# Patient Record
Sex: Male | Born: 1940 | Race: White | Hispanic: No | Marital: Married | Smoking: Former smoker
Health system: Southern US, Community
[De-identification: ages and names within clinical notes are randomized; demographics above are authoritative.]

## PROBLEM LIST (undated history)

## (undated) DIAGNOSIS — E785 Hyperlipidemia, unspecified: Secondary | ICD-10-CM

## (undated) DIAGNOSIS — I639 Cerebral infarction, unspecified: Secondary | ICD-10-CM

## (undated) DIAGNOSIS — T8859XA Other complications of anesthesia, initial encounter: Secondary | ICD-10-CM

## (undated) DIAGNOSIS — E039 Hypothyroidism, unspecified: Secondary | ICD-10-CM

## (undated) DIAGNOSIS — I2510 Atherosclerotic heart disease of native coronary artery without angina pectoris: Secondary | ICD-10-CM

## (undated) DIAGNOSIS — E079 Disorder of thyroid, unspecified: Secondary | ICD-10-CM

## (undated) DIAGNOSIS — I739 Peripheral vascular disease, unspecified: Secondary | ICD-10-CM

## (undated) DIAGNOSIS — I10 Essential (primary) hypertension: Secondary | ICD-10-CM

## (undated) DIAGNOSIS — I63239 Cerebral infarction due to unspecified occlusion or stenosis of unspecified carotid arteries: Secondary | ICD-10-CM

## (undated) DIAGNOSIS — N2889 Other specified disorders of kidney and ureter: Secondary | ICD-10-CM

## (undated) DIAGNOSIS — J189 Pneumonia, unspecified organism: Secondary | ICD-10-CM

## (undated) DIAGNOSIS — E119 Type 2 diabetes mellitus without complications: Secondary | ICD-10-CM

## (undated) DIAGNOSIS — N189 Chronic kidney disease, unspecified: Secondary | ICD-10-CM

## (undated) DIAGNOSIS — T4145XA Adverse effect of unspecified anesthetic, initial encounter: Secondary | ICD-10-CM

## (undated) DIAGNOSIS — I219 Acute myocardial infarction, unspecified: Secondary | ICD-10-CM

## (undated) DIAGNOSIS — C801 Malignant (primary) neoplasm, unspecified: Secondary | ICD-10-CM

## (undated) DIAGNOSIS — R634 Abnormal weight loss: Secondary | ICD-10-CM

## (undated) DIAGNOSIS — I499 Cardiac arrhythmia, unspecified: Secondary | ICD-10-CM

## (undated) DIAGNOSIS — Z5189 Encounter for other specified aftercare: Secondary | ICD-10-CM

## (undated) DIAGNOSIS — Z972 Presence of dental prosthetic device (complete) (partial): Secondary | ICD-10-CM

## (undated) HISTORY — DX: Cerebral infarction due to unspecified occlusion or stenosis of unspecified carotid artery: I63.239

## (undated) HISTORY — PX: BACK SURGERY: SHX140

## (undated) HISTORY — DX: Cerebral infarction, unspecified: I63.9

## (undated) HISTORY — PX: LARYNX SURGERY: SHX692

## (undated) HISTORY — DX: Pneumonia, unspecified organism: J18.9

## (undated) HISTORY — DX: Essential (primary) hypertension: I10

## (undated) HISTORY — DX: Hyperlipidemia, unspecified: E78.5

## (undated) HISTORY — PX: CATARACT EXTRACTION, BILATERAL: SHX1313

## (undated) HISTORY — DX: Disorder of thyroid, unspecified: E07.9

## (undated) HISTORY — PX: KNEE SURGERY: SHX244

## (undated) HISTORY — DX: Encounter for other specified aftercare: Z51.89

---

## 1987-10-15 HISTORY — PX: SPINE SURGERY: SHX786

## 1995-10-15 DIAGNOSIS — I219 Acute myocardial infarction, unspecified: Secondary | ICD-10-CM

## 1995-10-15 HISTORY — PX: CORONARY ARTERY BYPASS GRAFT: SHX141

## 1995-10-15 HISTORY — PX: CARDIAC CATHETERIZATION: SHX172

## 1995-10-15 HISTORY — DX: Acute myocardial infarction, unspecified: I21.9

## 1999-10-15 DIAGNOSIS — C801 Malignant (primary) neoplasm, unspecified: Secondary | ICD-10-CM

## 1999-10-15 HISTORY — DX: Malignant (primary) neoplasm, unspecified: C80.1

## 2000-10-14 HISTORY — PX: PROSTATE SURGERY: SHX751

## 2006-11-27 ENCOUNTER — Ambulatory Visit: Payer: Self-pay | Admitting: Family Medicine

## 2006-12-13 ENCOUNTER — Ambulatory Visit: Payer: Self-pay | Admitting: Family Medicine

## 2007-01-13 ENCOUNTER — Ambulatory Visit: Payer: Self-pay | Admitting: Family Medicine

## 2007-07-27 ENCOUNTER — Ambulatory Visit: Payer: Self-pay | Admitting: Family Medicine

## 2007-08-15 ENCOUNTER — Ambulatory Visit: Payer: Self-pay | Admitting: Family Medicine

## 2009-10-14 HISTORY — PX: HERNIA REPAIR: SHX51

## 2009-10-14 HISTORY — PX: APPENDECTOMY: SHX54

## 2009-10-14 HISTORY — PX: COLON SURGERY: SHX602

## 2010-03-01 ENCOUNTER — Ambulatory Visit: Payer: Self-pay | Admitting: Gastroenterology

## 2010-04-03 ENCOUNTER — Ambulatory Visit: Payer: Self-pay | Admitting: Gastroenterology

## 2010-05-30 ENCOUNTER — Ambulatory Visit: Payer: Self-pay | Admitting: Gastroenterology

## 2010-05-31 ENCOUNTER — Ambulatory Visit: Payer: Self-pay | Admitting: Gastroenterology

## 2010-06-01 ENCOUNTER — Other Ambulatory Visit: Payer: Self-pay | Admitting: Gastroenterology

## 2010-06-26 ENCOUNTER — Ambulatory Visit: Payer: Self-pay | Admitting: Surgery

## 2010-06-28 LAB — CEA: CEA: 17 ng/mL — ABNORMAL HIGH (ref 0.0–4.7)

## 2010-07-03 ENCOUNTER — Inpatient Hospital Stay: Payer: Self-pay | Admitting: Surgery

## 2010-07-06 LAB — PATHOLOGY REPORT

## 2010-07-14 ENCOUNTER — Ambulatory Visit: Payer: Self-pay | Admitting: Oncology

## 2010-07-27 ENCOUNTER — Ambulatory Visit: Payer: Self-pay | Admitting: Oncology

## 2010-07-29 LAB — CEA: CEA: 5.7 ng/mL — ABNORMAL HIGH (ref 0.0–4.7)

## 2010-08-14 ENCOUNTER — Ambulatory Visit: Payer: Self-pay | Admitting: Oncology

## 2010-11-09 ENCOUNTER — Ambulatory Visit: Payer: Self-pay | Admitting: Oncology

## 2010-11-14 ENCOUNTER — Ambulatory Visit: Payer: Self-pay | Admitting: Oncology

## 2010-12-19 ENCOUNTER — Ambulatory Visit: Payer: Self-pay | Admitting: Gastroenterology

## 2011-02-08 ENCOUNTER — Ambulatory Visit: Payer: Self-pay | Admitting: Oncology

## 2011-02-12 ENCOUNTER — Ambulatory Visit: Payer: Self-pay | Admitting: Oncology

## 2011-05-10 ENCOUNTER — Ambulatory Visit: Payer: Self-pay | Admitting: Oncology

## 2011-05-11 LAB — CEA: CEA: 4.4 ng/mL (ref 0.0–4.7)

## 2011-05-15 ENCOUNTER — Ambulatory Visit: Payer: Self-pay | Admitting: Oncology

## 2011-08-21 ENCOUNTER — Ambulatory Visit: Payer: Self-pay | Admitting: Oncology

## 2011-08-22 LAB — CEA: CEA: 5.5 ng/mL — ABNORMAL HIGH (ref 0.0–4.7)

## 2011-09-14 ENCOUNTER — Ambulatory Visit: Payer: Self-pay | Admitting: Oncology

## 2011-09-20 ENCOUNTER — Ambulatory Visit: Payer: Self-pay | Admitting: Gastroenterology

## 2011-11-07 LAB — COMPREHENSIVE METABOLIC PANEL
Albumin: 2.8 g/dL — ABNORMAL LOW (ref 3.4–5.0)
Alkaline Phosphatase: 88 U/L (ref 50–136)
BUN: 28 mg/dL — ABNORMAL HIGH (ref 7–18)
Bilirubin,Total: 0.3 mg/dL (ref 0.2–1.0)
Chloride: 111 mmol/L — ABNORMAL HIGH (ref 98–107)
Co2: 20 mmol/L — ABNORMAL LOW (ref 21–32)
Creatinine: 1.77 mg/dL — ABNORMAL HIGH (ref 0.60–1.30)
EGFR (African American): 49 — ABNORMAL LOW
Glucose: 81 mg/dL (ref 65–99)
Osmolality: 288 (ref 275–301)
SGOT(AST): 31 U/L (ref 15–37)
SGPT (ALT): 33 U/L
Sodium: 142 mmol/L (ref 136–145)
Total Protein: 6.3 g/dL — ABNORMAL LOW (ref 6.4–8.2)

## 2011-11-07 LAB — CBC
MCH: 25 pg — ABNORMAL LOW (ref 26.0–34.0)
MCHC: 32.4 g/dL (ref 32.0–36.0)
Platelet: 131 10*3/uL — ABNORMAL LOW (ref 150–440)
RBC: 5.62 10*6/uL (ref 4.40–5.90)
WBC: 9.2 10*3/uL (ref 3.8–10.6)

## 2011-11-07 LAB — CK TOTAL AND CKMB (NOT AT ARMC): CK, Total: 160 U/L (ref 35–232)

## 2011-11-07 LAB — MAGNESIUM: Magnesium: 1.1 mg/dL — ABNORMAL LOW

## 2011-11-07 LAB — TROPONIN I: Troponin-I: <0.02 ng/mL

## 2011-11-07 LAB — TSH: Thyroid Stimulating Horm: 3.84 u[IU]/mL

## 2011-11-08 ENCOUNTER — Observation Stay: Payer: Self-pay | Admitting: Internal Medicine

## 2011-11-08 LAB — CBC WITH DIFFERENTIAL/PLATELET
Basophil #: 0.1 10*3/uL (ref 0.0–0.1)
Eosinophil #: 0.1 10*3/uL (ref 0.0–0.7)
HCT: 38 % — ABNORMAL LOW (ref 40.0–52.0)
HGB: 12.5 g/dL — ABNORMAL LOW (ref 13.0–18.0)
Lymphocyte %: 34.2 %
MCHC: 32.8 g/dL (ref 32.0–36.0)
MCV: 76 fL — ABNORMAL LOW (ref 80–100)
Monocyte #: 0.6 10*3/uL (ref 0.0–0.7)
Monocyte %: 10.2 %
Neutrophil #: 3.1 10*3/uL (ref 1.4–6.5)
Neutrophil %: 52.3 %

## 2011-11-08 LAB — BASIC METABOLIC PANEL
Anion Gap: 12 (ref 7–16)
BUN: 29 mg/dL — ABNORMAL HIGH (ref 7–18)
Chloride: 108 mmol/L — ABNORMAL HIGH (ref 98–107)
Creatinine: 1.9 mg/dL — ABNORMAL HIGH (ref 0.60–1.30)
EGFR (African American): 45 — ABNORMAL LOW
EGFR (Non-African Amer.): 37 — ABNORMAL LOW
Glucose: 181 mg/dL — ABNORMAL HIGH (ref 65–99)
Osmolality: 295 (ref 275–301)

## 2011-11-08 LAB — TROPONIN I
Troponin-I: <0.02 ng/mL
Troponin-I: <0.02 ng/mL

## 2011-11-08 LAB — HEMOGLOBIN A1C: Hemoglobin A1C: 10.2 % — ABNORMAL HIGH (ref 4.2–6.3)

## 2011-11-08 LAB — MAGNESIUM: Magnesium: 1.9 mg/dL

## 2011-11-08 LAB — CK TOTAL AND CKMB (NOT AT ARMC)
CK, Total: 222 U/L (ref 35–232)
CK-MB: 2.7 ng/mL (ref 0.5–3.6)

## 2012-01-20 LAB — HM COLONOSCOPY: HM Colonoscopy: NORMAL

## 2012-02-26 ENCOUNTER — Ambulatory Visit: Payer: Self-pay | Admitting: Oncology

## 2012-02-26 LAB — CBC CANCER CENTER
Basophil #: 0.1 x10 3/mm (ref 0.0–0.1)
Basophil %: 0.9 %
Eosinophil #: 0.3 x10 3/mm (ref 0.0–0.7)
HCT: 45.1 % (ref 40.0–52.0)
HGB: 14.2 g/dL (ref 13.0–18.0)
Lymphocyte %: 32.9 %
MCV: 80 fL (ref 80–100)
Monocyte #: 0.7 x10 3/mm (ref 0.2–1.0)
Neutrophil %: 53.2 %
RBC: 5.63 10*6/uL (ref 4.40–5.90)
RDW: 17.7 % — ABNORMAL HIGH (ref 11.5–14.5)

## 2012-02-27 LAB — CEA: CEA: 4.5 ng/mL (ref 0.0–4.7)

## 2012-03-14 ENCOUNTER — Ambulatory Visit: Payer: Self-pay | Admitting: Oncology

## 2012-10-14 DIAGNOSIS — J189 Pneumonia, unspecified organism: Secondary | ICD-10-CM

## 2012-10-14 HISTORY — DX: Pneumonia, unspecified organism: J18.9

## 2012-12-07 ENCOUNTER — Ambulatory Visit: Payer: Self-pay | Admitting: Vascular Surgery

## 2012-12-10 ENCOUNTER — Ambulatory Visit: Payer: Self-pay | Admitting: Vascular Surgery

## 2012-12-10 LAB — BASIC METABOLIC PANEL
Anion Gap: 9 (ref 7–16)
Calcium, Total: 8.1 mg/dL — ABNORMAL LOW (ref 8.5–10.1)
Co2: 20 mmol/L — ABNORMAL LOW (ref 21–32)
Creatinine: 1.88 mg/dL — ABNORMAL HIGH (ref 0.60–1.30)
EGFR (African American): 40 — ABNORMAL LOW
Sodium: 140 mmol/L (ref 136–145)

## 2013-07-15 DIAGNOSIS — I129 Hypertensive chronic kidney disease with stage 1 through stage 4 chronic kidney disease, or unspecified chronic kidney disease: Secondary | ICD-10-CM | POA: Insufficient documentation

## 2013-07-15 DIAGNOSIS — E1122 Type 2 diabetes mellitus with diabetic chronic kidney disease: Secondary | ICD-10-CM | POA: Insufficient documentation

## 2013-09-01 ENCOUNTER — Ambulatory Visit: Payer: Self-pay | Admitting: Oncology

## 2013-09-01 LAB — CBC CANCER CENTER
Eosinophil %: 2.1 %
Lymphocyte #: 2.1 x10 3/mm (ref 1.0–3.6)
MCV: 90 fL (ref 80–100)
Monocyte #: 0.6 x10 3/mm (ref 0.2–1.0)
Neutrophil %: 58.6 %
RDW: 15 % — ABNORMAL HIGH (ref 11.5–14.5)
WBC: 6.8 x10 3/mm (ref 3.8–10.6)

## 2013-09-01 LAB — PROTIME-INR
INR: 1
Prothrombin Time: 13.7 secs (ref 11.5–14.7)

## 2013-09-02 LAB — CEA: CEA: 5.1 ng/mL — ABNORMAL HIGH (ref 0.0–4.7)

## 2013-09-13 ENCOUNTER — Ambulatory Visit: Payer: Self-pay | Admitting: Oncology

## 2013-09-20 ENCOUNTER — Ambulatory Visit: Payer: Self-pay | Admitting: Internal Medicine

## 2013-12-13 ENCOUNTER — Ambulatory Visit: Payer: Self-pay | Admitting: Internal Medicine

## 2014-04-14 DIAGNOSIS — I10 Essential (primary) hypertension: Secondary | ICD-10-CM | POA: Insufficient documentation

## 2014-04-14 DIAGNOSIS — I25810 Atherosclerosis of coronary artery bypass graft(s) without angina pectoris: Secondary | ICD-10-CM | POA: Insufficient documentation

## 2014-04-25 DIAGNOSIS — R809 Proteinuria, unspecified: Secondary | ICD-10-CM | POA: Insufficient documentation

## 2014-05-16 LAB — PSA: PSA: <0.1

## 2014-05-17 LAB — LIPID PANEL
Cholesterol: 126 mg/dL (ref 0–200)
HDL: 18 mg/dL — AB (ref 35–70)
LDL Cholesterol: 45 mg/dL
Triglycerides: 313 mg/dL — AB (ref 40–160)

## 2014-05-17 LAB — TSH: TSH: 11.9 u[IU]/mL — AB (ref <=5.90)

## 2014-05-19 DIAGNOSIS — E875 Hyperkalemia: Secondary | ICD-10-CM | POA: Insufficient documentation

## 2014-07-27 DIAGNOSIS — E889 Metabolic disorder, unspecified: Secondary | ICD-10-CM | POA: Insufficient documentation

## 2015-01-20 ENCOUNTER — Encounter: Payer: Self-pay | Admitting: Internal Medicine

## 2015-01-20 DIAGNOSIS — N289 Disorder of kidney and ureter, unspecified: Secondary | ICD-10-CM | POA: Insufficient documentation

## 2015-01-20 DIAGNOSIS — Z85038 Personal history of other malignant neoplasm of large intestine: Secondary | ICD-10-CM | POA: Insufficient documentation

## 2015-01-20 DIAGNOSIS — E1165 Type 2 diabetes mellitus with hyperglycemia: Secondary | ICD-10-CM | POA: Insufficient documentation

## 2015-01-20 DIAGNOSIS — M109 Gout, unspecified: Secondary | ICD-10-CM | POA: Insufficient documentation

## 2015-01-20 DIAGNOSIS — Z125 Encounter for screening for malignant neoplasm of prostate: Secondary | ICD-10-CM | POA: Insufficient documentation

## 2015-01-20 DIAGNOSIS — I2510 Atherosclerotic heart disease of native coronary artery without angina pectoris: Secondary | ICD-10-CM | POA: Insufficient documentation

## 2015-01-20 DIAGNOSIS — IMO0002 Reserved for concepts with insufficient information to code with codable children: Secondary | ICD-10-CM | POA: Insufficient documentation

## 2015-01-20 DIAGNOSIS — E1169 Type 2 diabetes mellitus with other specified complication: Secondary | ICD-10-CM | POA: Insufficient documentation

## 2015-01-20 DIAGNOSIS — E1142 Type 2 diabetes mellitus with diabetic polyneuropathy: Secondary | ICD-10-CM | POA: Insufficient documentation

## 2015-01-20 DIAGNOSIS — E785 Hyperlipidemia, unspecified: Secondary | ICD-10-CM

## 2015-01-20 DIAGNOSIS — E039 Hypothyroidism, unspecified: Secondary | ICD-10-CM | POA: Insufficient documentation

## 2015-02-03 NOTE — Op Note (Signed)
PATIENT NAME:  [name], [name] MR#:  [id number] DATE OF BIRTH:  1940/11/19  DATE OF PROCEDURE:  12/10/2012  PREOPERATIVE DIAGNOSES:  1.  Carotid artery stenosis.  2.  Chronic kidney disease precluding CT angiogram.  3.  Hypertension.  4.  Coronary artery disease.  5.  Diabetes.  POSTOPERATIVE DIAGNOSES:  1.  Carotid artery stenosis.  2.  Chronic kidney disease precluding CT angiogram.  3.  Hypertension.  4.  Coronary artery disease.  5.  Diabetes.  PROCEDURE:   1.  Ultrasound guidance for vascular access to right femoral artery.  2.  Catheter placement into left external carotid artery from right femoral approach.  3.  Thoracic aortogram.  4.  Selective left external carotid arteriogram.  5.  StarClose closure device for right femoral artery.   SURGEON: Algernon Huxley, M.D.   ANESTHESIA: Local with moderate conscious sedation.   ESTIMATED BLOOD LOSS: 25 mL.  CONTRAST USED: 54 mL Visipaque.   INDICATION FOR PROCEDURE: This is a 74 year old white male with carotid artery stenosis. He had velocities that were worrisome for severe stenotic disease of the left carotid artery on ultrasound. A CT angiogram was ordered, but could not be performed due to chronic kidney disease. He is brought in for a catheter-based angiogram with limited contrast for further evaluation. Risks and benefits were discussed. Informed consent was obtained.   DESCRIPTION OF PROCEDURE: The patient was brought to the vascular interventional radiology suite. Groins were shaved and prepped and a sterile surgical field was created. Due to body habitus and poorly palpable right femoral artery, ultrasound was used to access the right femoral artery. This was done under direct ultrasound guidance without difficulty with a Seldinger needle. A J-wire and 5-French sheath were placed. Pigtail catheter was placed to the aorta and an LAO projection thoracic aortogram was performed. This demonstrated a normal innominate artery with  a large vertebral artery. There appeared to be mild to moderate stenosis in the right internal carotid artery more distally. This was not tremendously well opacified but due to contrast limitations, selective imaging of his right carotid artery was not performed today, and this would correlate with his ultrasound findings in the office. His left subclavian artery was widely patent, as was the large left vertebral artery. He had separate takeoffs of the left external carotid artery and left internal carotid artery off of the aorta, as seen previously on our ultrasound. This was aberrant anatomy. The external carotid artery was patent and reasonably large. The internal carotid artery was very small and had minimal flow and appeared to be occluded only a few centimeters into the internal carotid artery. Two thoracic aortogram images were performed to confirm this. Attempts at selectively cannulating this artery to get a catheter into the very origin, but could not could selective imaging or pass a wire distally due to the apparent chronic total occlusion of the left internal carotid artery. To evaluate for collateralization, selective imaging of the left external carotid artery was performed. This was cannulated without difficulty with a Headhunter catheter passed into the mid cervical portion. Selective imaging showed the external carotid to be widely patent. There did appear to be some filling of the internal carotid artery near the siphon through collaterals, although intracerebral filling was minimal. At this point, I elected to terminate the procedure. The diagnostic catheter was removed.  An oblique arteriogram was performed of the right femoral artery and a StarClose closure device was deployed in the usual fashion with excellent hemostatic  result. The patient tolerated the procedure well and was taken to the recovery room in stable condition.    ____________________________ Algernon Huxley,  MD jsd:jm D: 12/10/2012 15:24:16 ET T: 12/10/2012 18:28:52 ET JOB#: [id number]  cc: Algernon Huxley, MD, <Dictator> Halina Maidens, MD Algernon Huxley MD ELECTRONICALLY SIGNED 12/17/2012 9:40

## 2015-02-05 NOTE — Discharge Summary (Signed)
PATIENT NAME:  [name], LEHOUILLIER MR#:  [id number] DATE OF BIRTH:  [birth year]  DATE OF ADMISSION:  11/08/2011 DATE OF DISCHARGE:  11/08/2011  ADMITTING DIAGNOSIS:  Syncope.   DISCHARGE DIAGNOSES:  1. Syncope of unclear cause, possibly vasovagal, status post cardiology evaluation. No arrhythmia noted on telemetry.  2. On discharge, coronary artery disease with previous history of myocardial infarction.  3. Hypertension.  4. Diabetes.  5. Benign prostatic hypertrophy.  6. History of previous blood clots.  7. Anemia.   8. Hypothyroidism.    9. History of colon cancer.  10. Status post colon resection.  11. Status post back surgery.  12. Status post knee surgery.  13. Status post coronary artery bypass graft.  14. Status post prostate surgery.  15. Status post colonoscopy.  16. Chronic renal failure, likely stage II to III.    PERTINENT LABORATORIES AND EVALUATIONS: EKG showed normal sinus rhythm with nonspecific T wave changes. PACs were present.  CT scan of the brain was negative. Total CPK was 160, CK-MB 2.2, glucose 81, BUN 28, creatinine 1.77, sodium 142, potassium 4.1, chloride 111, CO2 of 20, calcium 7.4, total bilirubin was 7.3, alkaline phosphatase was 88, ALT 33, AST 31, total protein 6.3, albumin 2.8. WBC count 9200, hemoglobin 14, hematocrit 43.2, platelet count was 13.1. Magnesium was 1.1. Cardiac enzymes x3 were less than 0.02. Repeat magnesium on 11/08/2011 showed a magnesium of 1.9.   A 2D echocardiogram shows normal LVEF, estimated ejection fraction 50%, left ventricular hypertrophy, mild mitral regurgitation and tricuspid regurgitation. The patient had a carotid Doppler done as an outpatient. At [facility] he was told that the carotid Dopplers were okay.   HISTORY OF PRESENT ILLNESS: Please see History and Physical done by the admitting physician. The patient is a 74 year old male who presented with chief complaint of a syncopal episode. The patient did not have any  preceding symptoms. He was seen in the ED, had an evaluation with a CT scan of the head that was negative. He was noted to have elevated creatinine, however reviewing back at his creatinine he has chronically elevated creatinine. He was not dehydrated. The patient was admitted, placed on telemetry. No arrhythmias were noted. He had a 2D echocardiogram that showed normal ejection fraction and no other significant abnormalities. He was seen by Cardiology who recommended outpatient followup with his primary cardiologist and if needed may need a 30-day event monitor if he has another episode. At this time, the patient is doing well and is anxious to go home. He is stable for discharge.    DISCHARGE DIET: Low sodium ADA diet.   ACTIVITY: As tolerated.   FOLLOWUP:  Timeframe for followup 1 to 2 weeks with Dr. Eliberto Ivory, follow up with Dr. Ubaldo Glassing of Cardiology in 2 to 4 weeks.   DISCHARGE MEDICATIONS:  1. Humulin 70/30, 120 units subcutaneous q.a.m., 80 units in the evening.   2. Levothyroxine 175 mcg daily.  3. Ecotrin 325 mg daily.  4. Omeprazole 40 daily.  5. Multivitamin 1 tab p.o. daily.   Primary MD needs to decide whether patient needs to be referred to nephrology. He may benefit from that.    TIME SPENT: 32 minutes spent.   ____________________________ Lafonda Mosses. Posey Pronto, MD shp:vtd D: 11/08/2011 16:32:54 ET    T: 11/09/2011 10:15:06 ET    JOB#: [id number] cc: Deetta Siegmann H. Posey Pronto, MD, <Dictator>   Dory Horn. Eliberto Ivory, MD Alric Seton MD ELECTRONICALLY SIGNED 11/30/2011 9:58

## 2015-02-05 NOTE — H&P (Signed)
PATIENT NAME:  [name], [name] MR#:  [id number] DATE OF BIRTH:  June 25, 1941  DATE OF ADMISSION:  11/08/2011  PRIMARY CARE PHYSICIAN: Dr. Calla Kicks   CHIEF COMPLAINT: Syncope.   HISTORY OF PRESENT ILLNESS: Patient is a 74 year old male who presents with chief complaint of syncopal episode about 5:30 p.m. Patient went to the refrigerator to get something to eat. He felt dizzy all of a sudden and passed out for about five minutes. He was unconscious. Witnessed by his wife. There were no reported seizures. No tongue biting. No urinary or fecal incontinence. In the afternoon patient had a carotid Doppler done which was negative. Patient is noted to have acute renal failure, creatinine is 1.77. He has history of nephrolithiasis.   PAST MEDICAL HISTORY:  1. Myocardial infarction. 2. Coronary artery disease.  3. Hypertension.  4. Diabetes.  5. Benign prostatic hypertrophy. 6. History of previous blood clots. 7. Anemia. 8. Hypothyroidism.  9. Colon cancer. 10. Colon resection. 11. Back surgery. 12. Knee surgery. 13. Coronary artery bypass graft. 14. Prostate surgery.  15. Colonoscopy.   ALLERGIES: Accupril and tape.   CURRENT MEDICATIONS: 1. Omeprazole 40 mg p.o. daily.  2. Multivitamin p.o. daily. 3. Levothyroxine 175 mcg p.o. daily.  4. Humulin 70/30, 120 units in the morning and 80 units in the p.m.  5. Ecotrin 325 mg p.o. daily.   SOCIAL HISTORY: Patient quit smoking. Denies tobacco abuse, alcohol abuse, or drug abuse. He lives with his wife.   FAMILY HISTORY: Patient's father died of an abdominal aortic aneurysm rupture. Mother died in her 29s, had lung cancer.   REVIEW OF SYSTEMS: CONSTITUTIONAL: Patient denies any fevers, chills, night sweats. HEENT: Patient denies any hearing loss, dysphagia, visual problems, sore throat. CARDIOVASCULAR: Patient denies any chest pain, orthopnea, PND. RESPIRATORY: Patient denies any cough, wheezing, or hemoptysis. GASTROINTESTINAL: Patient denies  any nausea, vomiting, abdominal pain, hematemesis, hematochezia, or melena. GENITOURINARY: Patient denies any hematuria, dysuria, frequency. NEUROLOGIC: Patient denies any headache, focal weakness or seizures. SKIN: Patient denies any lesions, rash. ENDOCRINE: Patient denies polyuria, polyphagia, polydipsia. MUSCULOSKELETAL: Patient denies any arthritis, joint effusion, swelling. HEMATOLOGICAL: Patient denies any easy bleeding or bruises.   PHYSICAL EXAMINATION:  VITAL SIGNS: Temperature 96.5, heart rate 76, respiratory rate 18, blood pressure 163/83, oxygen saturation 97%.   HEENT: Atraumatic, normocephalic. Pupils are equal, round, and reactive to light and accommodation. Extraocular movements intact. Sclera is anicteric. Mucous membranes are dry.   NECK: Supple. No organomegaly.   CARDIOVASCULAR: S1, S2, regular rate, rhythm. No gallops. No thrills. No murmurs.   RESPIRATORY: Patient's lungs are clear to auscultation. No rales, no rhonchi, no wheezes, no bronchial breath sounds.   GASTROINTESTINAL: Abdomen is soft, nontender, nondistended. Normal bowel sounds. No hepatosplenomegaly.   GENITOURINARY: There is no hematuria or masses.   SKIN: There are no lesions, no rash.   ENDOCRINE: There are no masses, no thyromegaly noted.   LYMPH: No lymphadenopathy or nodes palpable.   NEUROLOGIC: Cranial nerves II through XII grossly intact. Motor strength is symmetrical, 5/5 bilateral upper and lower extremities. Sensation is within normal limits. No focal neurological deficits noted on examination.   MUSCULOSKELETAL: There is no arthritis, joint effusions or swelling.   HEMATOLOGIC: There is no ecchymosis, no bleeding, no petechiae.   EXTREMITIES: There is no cyanosis, no clubbing, no edema. There is 2+ pedal pulses noted bilaterally.   LABORATORY, DIAGNOSTIC AND RADIOLOGICAL DATA: Electrocardiogram shows normal sinus rhythm, nonspecific T wave abnormalities, PACs are present.   CT scan  of  the brain is negative. Total CK 160, CK-MB 2.2, glucose 81, BUN 28, creatinine 1.77, sodium 142, potassium 4.1, chloride 111, CO2 20, calcium 7.4, total bilirubin 0.3, alkaline phosphatase 88, ALT 33, AST 31, total protein 6.3, albumin 2.8, estimated GFR 41, WBC count 9200, hemoglobin 14, hematocrit 43.2, platelet count 131, MCV 77, magnesium 1.1.   ASSESSMENT AND PLAN:  1. Patient is a 74 year old male presents with chief complaint of syncope. Admit patient to telemetry unit. Check serial cardiac enzymes and troponin, echo. Cardiology consultation. The patient had an outpatient carotid Doppler yesterday which was negative. Most likely cause for syncope is mild dehydration and vasovagal.  2. Acute renal failure on chronic renal failure, prerenal azotemia component. Start IV fluids. Patient has history of nephrolithiasis. Will get urology consultation.  3. Diabetes, type 2. Accu-Cheks, insulin sliding scale. Check hemoglobin A1c. Continue 70/30 insulin.  4. Hypothyroidism. Continue levothyroxine. Check TSH.  5. Gastroesophageal reflux disease. Continue omeprazole. 6. History of coronary artery disease, myocardial infarction, coronary artery bypass graft. Continue aspirin.  7. Hypoalbuminemia. Dietary consultation.  8. Mild thrombocytopenia: Monitor platelet count closely.    ____________________________ Tyrone Schimke, MD jsp:cms D: 11/07/2011 23:03:57 ET T: 11/08/2011 06:12:52 ET JOB#: [id number]  cc: Tyrone Schimke, MD, <Dictator> Dory Horn. Eliberto Ivory, MD Tyrone Schimke MD ELECTRONICALLY SIGNED 11/08/2011 22:42

## 2015-02-05 NOTE — Consult Note (Signed)
Brief Consult Note: Diagnosis: Syncope, ? etiology, neg trop, neg ECG, neg tele, normal EF on echo.   Patient was seen by consultant.   Consult note dictated.   Comments: REC  Agree with current therapy, defer full dose anticoagulation, defer further cardiac w/u at this time, f/u Dr Ubaldo Glassing, may consider 30d loop.  Electronic Signatures: Isaias Cowman (MD)  (Signed 25-Jan-13 15:23)  Authored: Brief Consult Note   Last Updated: 25-Jan-13 15:23 by Isaias Cowman (MD)

## 2015-02-05 NOTE — Consult Note (Signed)
PATIENT NAME:  [name], [name] MR#:  [id number] DATE OF BIRTH:  December 19, 1940  DATE OF CONSULTATION:  11/08/2011  REFERRING PHYSICIAN:   CONSULTING PHYSICIAN:  Isaias Cowman, MD  PRIMARY CARE PHYSICIAN: Calla Kicks, MD  CHIEF COMPLAINT: I passed out.   HISTORY OF PRESENT ILLNESS: The patient is a 74 year old gentleman with known history of coronary artery disease status post prior bypass graft surgery who was admitted following a syncopal episode. The patient reports that he was standing in front of the refrigerator and then became lightheaded and apparently passed out. This was witnessed by his wife. EMS was called. The patient was brought to [facility] Emergency Room and was admitted to telemetry. The patient has ruled out for myocardial infarction by CPK isoenzymes and troponin. He has had no recurrence. There has been no evidence for significant brady or tachyarrhythmia on telemetry. The patient denies chest pain and shortness of breath. Echocardiogram revealed preserved left ventricular function.   PAST MEDICAL HISTORY:  1. Coronary artery disease status post bypass graft surgery.  2. Hypertension.  3. Diabetes.  4. Obesity.  5. Hypothyroidism.  6. Anemia.  7. Chronic renal insufficiency.  8. History of colon cancer status post resection. 9. Arthritis status post back and knee surgery.   MEDICATIONS:  1. Ecotrin 325 mg daily.  2. Omeprazole 40 mg daily.  3. Multivitamin 1 daily.  4. Levothyroxine 175 mcg daily.  5. Humulin 70/30, 120 units every a.m. and 80 units at bedtime.  SOCIAL HISTORY: The patient lives with his wife. He denies tobacco abuse.   FAMILY HISTORY: No immediate family history for coronary artery disease or myocardial infarction.   REVIEW OF SYSTEMS:  CONSTITUTIONAL: No fever or chills. EYES: No vision loss. EARS: No hearing loss. RESPIRATORY: No shortness of breath. CARDIOVASCULAR: The patient denies chest pain. The patient did have a  syncopal episode. GASTROINTESTINAL: The patient denies nausea, vomiting, diarrhea, or constipation. GU: The patient denies dysuria or hematuria. ENDOCRINE: The patient denies polyuria or polydipsia. NEUROLOGIC: The patient denies focal muscle weakness or numbness. PSYCHOLOGICAL: The patient denies depression or anxiety.   PHYSICAL EXAMINATION:   VITAL SIGNS: Blood pressure 180/81, pulse 67, respirations 18, temperature 97.4.   HEENT: Pupils equal and reactive to light and accommodation.   NECK: Supple without thyromegaly.   LUNGS: Clear.   HEART: Normal jugular venous pressure. Normal point of maximal impulse. Regular rate and rhythm. Normal S1 and S2. No appreciable gallop, murmur, or rub.   ABDOMEN: Soft and nontender. Pulses were intact bilaterally.  MUSCULOSKELETAL: Normal muscle tone.   NEUROLOGIC: The patient is alert and oriented x3. Motor and sensory are both grossly intact.   IMPRESSION: This is a 74 year old gentleman with known coronary artery disease who presents after a brief syncopal episode and has ruled out for myocardial infarction by CPK isoenzymes and troponin. The patient has had no recurrence. Telemetry has been unremarkable. Echocardiogram has revealed a preserved left ventricular function.   RECOMMENDATIONS:  1. Agree with current therapy.  2. Defer full-dose anticoagulation.  3. Would defer further noninvasive or invasive evaluation.  4. Would continue work-up possibly with a 30-day loop monitor as an outpatient with Dr. Ubaldo Glassing. ____________________________ Isaias Cowman, MD ap:slb D: 11/08/2011 15:21:49 ET T: 11/08/2011 16:19:45 ET JOB#: [id number]  cc: Isaias Cowman, MD, <Dictator> Isaias Cowman MD ELECTRONICALLY SIGNED 11/29/2011 10:57

## 2015-03-16 ENCOUNTER — Encounter: Payer: Self-pay | Admitting: Internal Medicine

## 2015-03-16 ENCOUNTER — Ambulatory Visit (INDEPENDENT_AMBULATORY_CARE_PROVIDER_SITE_OTHER): Payer: PPO | Admitting: Internal Medicine

## 2015-03-16 VITALS — BP 138/76 | HR 84 | Ht 74.0 in | Wt 255.4 lb

## 2015-03-16 DIAGNOSIS — N400 Enlarged prostate without lower urinary tract symptoms: Secondary | ICD-10-CM | POA: Insufficient documentation

## 2015-03-16 DIAGNOSIS — I25810 Atherosclerosis of coronary artery bypass graft(s) without angina pectoris: Secondary | ICD-10-CM | POA: Diagnosis not present

## 2015-03-16 DIAGNOSIS — I10 Essential (primary) hypertension: Secondary | ICD-10-CM | POA: Diagnosis not present

## 2015-03-16 DIAGNOSIS — N183 Chronic kidney disease, stage 3 unspecified: Secondary | ICD-10-CM

## 2015-03-16 DIAGNOSIS — E782 Mixed hyperlipidemia: Secondary | ICD-10-CM | POA: Diagnosis not present

## 2015-03-16 NOTE — Progress Notes (Signed)
Date:  03/16/2015   Name:  [name]   DOB:  11-20-1940   MRN:  [id number]  PCP:  Halina Maidens, MD    Chief Complaint: Diabetes; Hypothyroidism; and Hyperlipidemia   History of Present Illness:  Diabetes - Now seeing Dr. Eddie Dibbles.  His insulin was changed to NPH bid and SSI regular before each meal.  His last A1C 8.6 which is improved.  He feels that he is doing well.  He does not check his BS regularly but estimates the sliding scale based on his intake.  He has not had a diabetic eye exam.  Hypothyroidism - Dr. Eddie Dibbles changed him to synthroid 300 mcg daily.  No associated thyroid symptoms.    Hyperlipidemia -   Neuropathy - he is having more foot pain - worse with activity.  He takes gabapentin 100 mg only as needed for severe foot pain.  He has some sedation but it is tolerable.  He has not tried taking at hs every night.  CAD -  Review of Systems:  Review of Systems  Constitutional: Negative for fever, fatigue and unexpected weight change.  Eyes: Negative for visual disturbance.  Respiratory: Negative for chest tightness, shortness of breath and wheezing.   Cardiovascular: Negative for chest pain, palpitations and leg swelling.  Gastrointestinal: Negative for abdominal pain.  Skin: Negative for rash and wound.  Neurological: Negative for dizziness, light-headedness and headaches.       Numbness and burning in both feet.    Patient Active Problem List   Diagnosis Date Noted  . 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 Chronic kidney disease (CKD), stage III (moderate) 03/16/2015  . Diabetes mellitus with polyneuropathy 03/16/2015  . CAD in native artery 01/20/2015  . Impaired renal function 01/20/2015  . Gout 01/20/2015  . Diabetic peripheral neuropathy 01/20/2015  . H/O malignant neoplasm of colon 01/20/2015  . Adult hypothyroidism 01/20/2015  . Combined fat and carbohydrate induced hyperlipemia 01/20/2015  . Special screening for malignant neoplasm of prostate  01/20/2015  . Diabetes mellitus type 2, uncontrolled 01/20/2015  . Encounter for long-term (current) use of insulin 07/27/2014  . High potassium 05/19/2014  . Microalbuminuria 04/25/2014  . 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 Benign essential HTN 04/14/2014  . BP (high blood pressure) 07/15/2013    Prior to Admission medications   Medication Sig Start Date End Date Taking? Authorizing Provider  glucose blood test strip 3 (three) times daily. 02/20/15  Yes Historical Provider, MD  albuterol (PROVENTIL HFA;VENTOLIN HFA) 108 (90 BASE) MCG/ACT inhaler Inhale 2 puffs into the lungs 4 (four) times daily as needed. 01/03/15   Historical Provider, MD  aspirin 325 MG EC tablet Take 1 tablet by mouth daily.    Historical Provider, MD  atenolol (TENORMIN) 25 MG tablet Take 1 tablet by mouth daily. 08/28/14   Historical Provider, MD  gabapentin (NEURONTIN) 100 MG capsule Take 1 capsule by mouth daily.    Historical Provider, MD  insulin NPH Human (HUMULIN N,NOVOLIN N) 100 UNIT/ML injection Inject 65 Units into the skin daily. 30 u AM and 35 u PM    Historical Provider, MD  insulin regular human CONCENTRATED (HUMULIN R) 500 UNIT/ML SOLN injection Inject 22 Units into the skin 2 (two) times daily.    Historical Provider, MD  levothyroxine (SYNTHROID, LEVOTHROID) 300 MCG tablet Take 1 tablet by mouth daily. 03/22/14   Historical Provider, MD  ranitidine (ZANTAC) 150 MG capsule Take 1 capsule by mouth 2 (  two) times daily. 06/25/13   Historical Provider, MD  simvastatin (ZOCOR) 20 MG tablet Take 1 tablet by mouth at bedtime. 05/22/14   Historical Provider, MD    Allergies  Allergen Reactions  . Ace Inhibitors     Raises potassium    Past Surgical History  Procedure Laterality Date  . Prostate surgery  2002    BPH benign pathology  . Coronary artery bypass graft  1997    x 3  . Spine surgery  1989    Lumbar disc  . Colon surgery  2011    Colectomy for  ileo-cecal valve cancer  . Appendectomy  2011  . Hernia repair  2011    Ventral hernia    History  Substance Use Topics  . Smoking status: Former Smoker    Quit date: 06/24/1996  . Smokeless tobacco: Not on file  . Alcohol Use: No    Family History  Problem Relation Age of Onset  . Cancer Mother     Lung  . Diabetes Mother   . Heart disease Mother     Medication list has been reviewed and updated.  Physical Examination:  Physical Exam  Constitutional: He is oriented to person, place, and time. He appears well-developed and well-nourished. No distress.  Eyes: Conjunctivae are normal. Right eye exhibits no discharge. Left eye exhibits no discharge.  Neck: Normal range of motion. Neck supple. No thyromegaly present.  Cardiovascular: Normal rate and normal heart sounds.   Pulmonary/Chest: Effort normal. No respiratory distress. He has no wheezes. He has no rales.  Abdominal: He exhibits no distension and no mass. There is no tenderness. There is no rebound.  Lymphadenopathy:    He has no cervical adenopathy.  Neurological: He is alert and oriented to person, place, and time.  Skin: Skin is warm and dry. No rash noted.  Psychiatric: He has a normal mood and affect. His behavior is normal. Thought content normal.  Nursing note and vitals reviewed.   BP 138/76 mmHg  Pulse 84  Ht 6' 2"  (1.88 m)  Wt 255 lb 6.4 oz (115.849 kg)  BMI 32.78 kg/m2  Assessment and Plan:  1. Combined fat and carbohydrate induced hyperlipemia Controlled - continue current therapy. - Lipid Profile  2. Chronic kidney disease (CKD), stage III (moderate) Has been followed by [facility] - pt reports this as stable. Lab Results  Component Value Date   CREATININE 2.27* 09/01/2013   BUN 33* 09/01/2013   NA 140 12/10/2012   K 4.6 12/10/2012   CL 111* 12/10/2012   CO2 20* 12/10/2012   - Comp Met (CMET)  3. 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  Continue aspirin and beta blockers  4. Essential hypertension Controlled - continue current therapy.  - CBC w/Diff   Halina Maidens, MD [facility]  03/16/2015

## 2015-03-16 NOTE — Patient Instructions (Signed)
Please schedule a Diabetic Eye exam.

## 2015-03-16 NOTE — Addendum Note (Signed)
Addended by: Glean Hess on: 03/16/2015 01:20 PM   Modules accepted: Miquel Dunn

## 2015-03-17 LAB — CBC WITH DIFFERENTIAL/PLATELET
Basophils Absolute: 0.1 10*3/uL (ref 0.0–0.2)
Basos: 1 %
EOS (ABSOLUTE): 0.2 10*3/uL (ref 0.0–0.4)
EOS: 3 %
Hematocrit: 47.4 % (ref 37.5–51.0)
Hemoglobin: 15.9 g/dL (ref 12.6–17.7)
IMMATURE GRANS (ABS): 0 10*3/uL (ref 0.0–0.1)
Immature Granulocytes: 0 %
LYMPHS: 40 %
Lymphocytes Absolute: 2.8 10*3/uL (ref 0.7–3.1)
MCH: 29.7 pg (ref 26.6–33.0)
MCHC: 33.5 g/dL (ref 31.5–35.7)
MCV: 89 fL (ref 79–97)
MONOCYTES: 7 %
Monocytes Absolute: 0.5 10*3/uL (ref 0.1–0.9)
NEUTROS PCT: 49 %
Neutrophils Absolute: 3.4 10*3/uL (ref 1.4–7.0)
PLATELETS: 164 10*3/uL (ref 150–379)
RBC: 5.35 x10E6/uL (ref 4.14–5.80)
RDW: 15.2 % (ref 12.3–15.4)
WBC: 7 10*3/uL (ref 3.4–10.8)

## 2015-03-17 LAB — LIPID PANEL
Chol/HDL Ratio: 9.7 ratio units — ABNORMAL HIGH (ref 0.0–5.0)
Cholesterol, Total: 185 mg/dL (ref 100–199)
HDL: 19 mg/dL — ABNORMAL LOW (ref >39)
LDL CALC: 100 mg/dL — AB (ref 0–99)
Triglycerides: 328 mg/dL — ABNORMAL HIGH (ref 0–149)
VLDL Cholesterol Cal: 66 mg/dL — ABNORMAL HIGH (ref 5–40)

## 2015-03-17 LAB — COMPREHENSIVE METABOLIC PANEL
ALT: 23 IU/L (ref 0–44)
AST: 26 IU/L (ref 0–40)
Albumin/Globulin Ratio: 1.1 (ref 1.1–2.5)
Albumin: 3.9 g/dL (ref 3.5–4.8)
Alkaline Phosphatase: 104 IU/L (ref 39–117)
BILIRUBIN TOTAL: 0.4 mg/dL (ref 0.0–1.2)
BUN / CREAT RATIO: 12 (ref 10–22)
BUN: 27 mg/dL (ref 8–27)
CALCIUM: 8.9 mg/dL (ref 8.6–10.2)
CHLORIDE: 101 mmol/L (ref 97–108)
CO2: 20 mmol/L (ref 18–29)
Creatinine, Ser: 2.17 mg/dL — ABNORMAL HIGH (ref 0.76–1.27)
GFR calc Af Amer: 33 mL/min/{1.73_m2} — ABNORMAL LOW (ref >59)
GFR, EST NON AFRICAN AMERICAN: 29 mL/min/{1.73_m2} — AB (ref >59)
GLUCOSE: 164 mg/dL — AB (ref 65–99)
Globulin, Total: 3.4 g/dL (ref 1.5–4.5)
Potassium: 5 mmol/L (ref 3.5–5.2)
Sodium: 138 mmol/L (ref 134–144)
TOTAL PROTEIN: 7.3 g/dL (ref 6.0–8.5)

## 2015-05-11 ENCOUNTER — Other Ambulatory Visit: Payer: Self-pay

## 2015-05-11 ENCOUNTER — Emergency Department
Admission: EM | Admit: 2015-05-11 | Discharge: 2015-05-11 | Disposition: A | Discharge status: Home / Self Care | Payer: PPO | Attending: Emergency Medicine | Admitting: Emergency Medicine

## 2015-05-11 DIAGNOSIS — Z79899 Other long term (current) drug therapy: Secondary | ICD-10-CM | POA: Diagnosis not present

## 2015-05-11 DIAGNOSIS — E1142 Type 2 diabetes mellitus with diabetic polyneuropathy: Secondary | ICD-10-CM | POA: Insufficient documentation

## 2015-05-11 DIAGNOSIS — R55 Syncope and collapse: Secondary | ICD-10-CM | POA: Insufficient documentation

## 2015-05-11 DIAGNOSIS — N183 Chronic kidney disease, stage 3 (moderate): Secondary | ICD-10-CM | POA: Diagnosis not present

## 2015-05-11 DIAGNOSIS — I129 Hypertensive chronic kidney disease with stage 1 through stage 4 chronic kidney disease, or unspecified chronic kidney disease: Secondary | ICD-10-CM | POA: Diagnosis not present

## 2015-05-11 DIAGNOSIS — Z87891 Personal history of nicotine dependence: Secondary | ICD-10-CM | POA: Diagnosis not present

## 2015-05-11 DIAGNOSIS — Z7982 Long term (current) use of aspirin: Secondary | ICD-10-CM | POA: Diagnosis not present

## 2015-05-11 DIAGNOSIS — Z794 Long term (current) use of insulin: Secondary | ICD-10-CM | POA: Insufficient documentation

## 2015-05-11 LAB — BASIC METABOLIC PANEL
ANION GAP: 9 (ref 5–15)
BUN: 32 mg/dL — AB (ref 6–20)
CHLORIDE: 105 mmol/L (ref 101–111)
CO2: 20 mmol/L — ABNORMAL LOW (ref 22–32)
CREATININE: 2.36 mg/dL — AB (ref 0.61–1.24)
Calcium: 8.1 mg/dL — ABNORMAL LOW (ref 8.9–10.3)
GFR calc Af Amer: 29 mL/min — ABNORMAL LOW (ref >60)
GFR, EST NON AFRICAN AMERICAN: 25 mL/min — AB (ref >60)
Glucose, Bld: 151 mg/dL — ABNORMAL HIGH (ref 65–99)
Potassium: 4.8 mmol/L (ref 3.5–5.1)
Sodium: 134 mmol/L — ABNORMAL LOW (ref 135–145)

## 2015-05-11 LAB — CBC
HEMATOCRIT: 42.5 % (ref 40.0–52.0)
Hemoglobin: 14.2 g/dL (ref 13.0–18.0)
MCH: 29.8 pg (ref 26.0–34.0)
MCHC: 33.3 g/dL (ref 32.0–36.0)
MCV: 89.5 fL (ref 80.0–100.0)
PLATELETS: 142 10*3/uL — AB (ref 150–440)
RBC: 4.75 MIL/uL (ref 4.40–5.90)
RDW: 15.1 % — ABNORMAL HIGH (ref 11.5–14.5)
WBC: 7.8 10*3/uL (ref 3.8–10.6)

## 2015-05-11 LAB — TROPONIN I

## 2015-05-11 MED ORDER — SODIUM CHLORIDE 0.9 % IV SOLN
Freq: Once | INTRAVENOUS | Status: AC
  Administered 2015-05-11: 17:00:00 via INTRAVENOUS

## 2015-05-11 NOTE — ED Notes (Signed)
Patient with no complaints at this time. Respirations even and unlabored. Skin warm/dry. Discharge instructions reviewed with patient at this time. Patient given opportunity to voice concerns/ask questions. IV removed per policy and band-aid applied to site. Patient discharged at this time and left Emergency Department with steady gait.  

## 2015-05-11 NOTE — ED Notes (Signed)
Ems, syncope from from standing position , no head trauma.

## 2015-05-11 NOTE — Discharge Instructions (Signed)
Please seek medical attention for any high fevers, chest pain, shortness of breath, change in behavior, persistent vomiting, bloody stool or any other new or concerning symptoms.  Syncope Syncope is a medical term for fainting or passing out. This means you lose consciousness and drop to the ground. People are generally unconscious for less than 5 minutes. You may have some muscle twitches for up to 15 seconds before waking up and returning to normal. Syncope occurs more often in older adults, but it can happen to anyone. While most causes of syncope are not dangerous, syncope can be a sign of a serious medical problem. It is important to seek medical care.  CAUSES  Syncope is caused by a sudden drop in blood flow to the brain. The specific cause is often not determined. Factors that can bring on syncope include:  Taking medicines that lower blood pressure.  Sudden changes in posture, such as standing up quickly.  Taking more medicine than prescribed.  Standing in one place for too long.  Seizure disorders.  Dehydration and excessive exposure to heat.  Low blood sugar (hypoglycemia).  Straining to have a bowel movement.  Heart disease, irregular heartbeat, or other circulatory problems.  Fear, emotional distress, seeing blood, or severe pain. SYMPTOMS  Right before fainting, you may:  Feel dizzy or light-headed.  Feel nauseous.  See all white or all black in your field of vision.  Have cold, clammy skin. DIAGNOSIS  Your health care provider will ask about your symptoms, perform a physical exam, and perform an electrocardiogram (ECG) to record the electrical activity of your heart. Your health care provider may also perform other heart or blood tests to determine the cause of your syncope which may include:  Transthoracic echocardiogram (TTE). During echocardiography, sound waves are used to evaluate how blood flows through your heart.  Transesophageal echocardiogram  (TEE).  Cardiac monitoring. This allows your health care provider to monitor your heart rate and rhythm in real time.  Holter monitor. This is a portable device that records your heartbeat and can help diagnose heart arrhythmias. It allows your health care provider to track your heart activity for several days, if needed.  Stress tests by exercise or by giving medicine that makes the heart beat faster. TREATMENT  In most cases, no treatment is needed. Depending on the cause of your syncope, your health care provider may recommend changing or stopping some of your medicines. HOME CARE INSTRUCTIONS  Have someone stay with you until you feel stable.  Do not drive, use machinery, or play sports until your health care provider says it is okay.  Keep all follow-up appointments as directed by your health care provider.  Lie down right away if you start feeling like you might faint. Breathe deeply and steadily. Wait until all the symptoms have passed.  Drink enough fluids to keep your urine clear or pale yellow.  If you are taking blood pressure or heart medicine, get up slowly and take several minutes to sit and then stand. This can reduce dizziness. SEEK IMMEDIATE MEDICAL CARE IF:   You have a severe headache.  You have unusual pain in the chest, abdomen, or back.  You are bleeding from your mouth or rectum, or you have black or tarry stool.  You have an irregular or very fast heartbeat.  You have pain with breathing.  You have repeated fainting or seizure-like jerking during an episode.  You faint when sitting or lying down.  You have confusion.  You have trouble walking.  You have severe weakness.  You have vision problems. If you fainted, call your local emergency services (911 in [place] Do not drive yourself to the hospital.  MAKE SURE YOU:  Understand these instructions.  Will watch your condition.  Will get help right away if you are not doing well or get  worse. Document Released: 09/30/2005 Document Revised: 10/05/2013 Document Reviewed: 11/29/2011 [facility] Patient Information 2015 [place], [place]. This information is not intended to replace advice given to you by your health care provider. Make sure you discuss any questions you have with your health care provider.

## 2015-05-11 NOTE — ED Provider Notes (Signed)
[facility] Emergency Department Provider Note   ____________________________________________  Time seen: On EMS arrival  I have reviewed the triage vital signs and the nursing notes.   HISTORY  Chief Complaint Syncope  History limited by: Not Limited   HPI [name] is a 74 y.o. male who presents to the emergency department today via EMS after a syncopal episode. The patient states that when he woke up this morning he was feeling fine. He did go to his endocrinologist for a regular scheduled follow-up appointment. 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 He started feeling bad when he was driving his truck home. He describes it as being a feeling of lightheadedness. He did not have any chest pain or felt that his heart was racing or skipping beats. When he got home he sat down and continued to feel unwell. He got up to go to the bedroom when as he was walking through the hallway he fainted. Currently he states he feels fine. He does not feel any pain from the fall.    No past medical history on file.  Patient Active Problem List   Diagnosis Date Noted  . 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 Chronic kidney disease (CKD), stage III (moderate) 03/16/2015  . Diabetes mellitus with polyneuropathy 03/16/2015  . CAD in native artery 01/20/2015  . Impaired renal function 01/20/2015  . Gout 01/20/2015  . Diabetic peripheral neuropathy 01/20/2015  . H/O malignant neoplasm of colon 01/20/2015  . Adult hypothyroidism 01/20/2015  . Combined fat and carbohydrate induced hyperlipemia 01/20/2015  . Special screening for malignant neoplasm of prostate 01/20/2015  . Diabetes mellitus type 2, uncontrolled 01/20/2015  . Encounter for long-term (current) use of insulin 07/27/2014  . High potassium 05/19/2014  . Microalbuminuria 04/25/2014  . 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 Benign essential  HTN 04/14/2014  . BP (high blood pressure) 07/15/2013    Past Surgical History  Procedure Laterality Date  . Prostate surgery  2002    BPH benign pathology  . Coronary artery bypass graft  1997    x 3  . Spine surgery  1989    Lumbar disc  . 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 Appendectomy  2011  . Hernia repair  2011    Ventral hernia    Current Outpatient Rx  Name  Route  Sig  Dispense  Refill  . albuterol (PROVENTIL HFA;VENTOLIN HFA) 108 (90 BASE) MCG/ACT inhaler   Inhalation   Inhale 2 puffs into the lungs 4 (four) times daily as needed.         Marland Kitchen aspirin 325 MG EC tablet   Oral   Take 1 tablet by mouth daily.         Marland Kitchen atenolol (TENORMIN) 25 MG tablet   Oral   Take 1 tablet by mouth daily.         Marland Kitchen gabapentin (NEURONTIN) 100 MG capsule   Oral   Take 1 capsule by mouth daily.         Marland Kitchen glucose blood test strip      3 (three) times daily.         . insulin NPH Human (HUMULIN N,NOVOLIN N) 100 UNIT/ML injection   Subcutaneous   Inject 65 Units into the skin daily. 30 u AM and 35 u PM         . insulin  regular human CONCENTRATED (HUMULIN R) 500 UNIT/ML SOLN injection   Subcutaneous   Inject 22 Units into the skin 2 (two) times daily.         Marland Kitchen levothyroxine (SYNTHROID, LEVOTHROID) 300 MCG tablet   Oral   Take 1 tablet by mouth daily.         . ranitidine (ZANTAC) 150 MG capsule   Oral   Take 1 capsule by mouth 2 (two) times daily.         . simvastatin (ZOCOR) 20 MG tablet   Oral   Take 1 tablet by mouth at bedtime.           Allergies Ace inhibitors  Family History  Problem Relation Age of Onset  . Cancer Mother     Lung  . Diabetes Mother   . Heart disease Mother     Social History History  Substance Use Topics  . Smoking status: Former Smoker    Quit date: 06/24/1996  . Smokeless tobacco: Not on file  . Alcohol Use: No    Review of Systems  Constitutional: Negative for  fever. Cardiovascular: Negative for chest pain. Respiratory: Negative for shortness of breath. Gastrointestinal: Negative for abdominal pain, vomiting and diarrhea. Genitourinary: Negative for dysuria. Musculoskeletal: Negative for back pain. Skin: Negative for rash. Neurological: Positive for lightheadedness. Positive for fainting.   10-point ROS otherwise negative.  ____________________________________________   PHYSICAL EXAM:  VITAL SIGNS:   98.4 F (36.9 C)  76  20   153/66 mmHg  95 %     Constitutional: Alert and oriented. Well appearing and in no distress. Eyes: Conjunctivae are normal. PERRL. Normal extraocular movements. ENT   Head: Normocephalic and atraumatic.   Nose: No congestion/rhinnorhea.   Mouth/Throat: Mucous membranes are moist.   Neck: No stridor. Hematological/Lymphatic/Immunilogical: No cervical lymphadenopathy. Cardiovascular: Normal rate, regular rhythm.  No murmurs, rubs, or gallops. Respiratory: Normal respiratory effort without tachypnea nor retractions. Breath sounds are clear and equal bilaterally. No wheezes/rales/rhonchi. Gastrointestinal: Soft and nontender. No distention.  Genitourinary: Deferred Musculoskeletal: Normal range of motion in all extremities. No joint effusions.  No lower extremity tenderness nor edema. Neurologic:  Normal speech and language. No gross focal neurologic deficits are appreciated. Speech is normal.  Skin:  Skin is warm, dry and intact. No rash noted. Psychiatric: Mood and affect are normal. Speech and behavior are normal. Patient exhibits appropriate insight and judgment.  ____________________________________________    LABS (pertinent positives/negatives)  Labs Reviewed  CBC - Abnormal; Notable for the following:    RDW 15.1 (*)    Platelets 142 (*)    All other components within normal limits  BASIC METABOLIC PANEL - Abnormal; Notable for the following:    Sodium 134 (*)    CO2 20 (*)     Glucose, Bld 151 (*)    BUN 32 (*)    Creatinine, Ser 2.36 (*)    Calcium 8.1 (*)    GFR calc non Af Amer 25 (*)    GFR calc Af Amer 29 (*)    All other components within normal limits  TROPONIN I  TROPONIN I     ____________________________________________   EKG  I, Nance Pear, attending physician, personally viewed and interpreted this EKG  EKG Time: 1557 Rate: 71 Rhythm: sinus rhythm Axis: normal Intervals: qtc 419 QRS: narrow ST changes: j point elevation V2  EKG without significant change from EKG dated 11/07/2011  ____________________________________________    RADIOLOGY  None  ____________________________________________   PROCEDURES  Procedure(s) performed: None  Critical Care performed: No  ____________________________________________   INITIAL IMPRESSION / ASSESSMENT AND PLAN / ED COURSE  Pertinent labs & imaging results that were available during my care of the patient were reviewed by me and considered in my medical decision making (see chart for details).  Patient presents to the emergency department today after syncopal episode. Patient does state that he was in this hot truck slightly prior to this episode. I would imagine likely that the sink be secondary to heat exposure and possibly dehydration however given history of heart disease will check blood work, Trop.   2 sets of troponin negative. Patient's creatinine mildly elevated over baseline. Think likely patient's syncope was secondary to dehydration and heat exposure.  ____________________________________________   FINAL CLINICAL IMPRESSION(S) / ED DIAGNOSES  Final diagnoses:  Syncope and collapse     Nance Pear, MD 05/11/15 2116

## 2015-07-17 ENCOUNTER — Other Ambulatory Visit: Payer: Self-pay | Admitting: Internal Medicine

## 2015-09-14 ENCOUNTER — Ambulatory Visit (INDEPENDENT_AMBULATORY_CARE_PROVIDER_SITE_OTHER): Payer: PPO | Admitting: Internal Medicine

## 2015-09-14 ENCOUNTER — Encounter: Payer: Self-pay | Admitting: Internal Medicine

## 2015-09-14 VITALS — BP 122/62 | HR 64 | Ht 73.0 in | Wt 261.6 lb

## 2015-09-14 DIAGNOSIS — E1142 Type 2 diabetes mellitus with diabetic polyneuropathy: Secondary | ICD-10-CM | POA: Diagnosis not present

## 2015-09-14 DIAGNOSIS — E1165 Type 2 diabetes mellitus with hyperglycemia: Secondary | ICD-10-CM

## 2015-09-14 DIAGNOSIS — N184 Chronic kidney disease, stage 4 (severe): Secondary | ICD-10-CM

## 2015-09-14 DIAGNOSIS — I129 Hypertensive chronic kidney disease with stage 1 through stage 4 chronic kidney disease, or unspecified chronic kidney disease: Secondary | ICD-10-CM

## 2015-09-14 DIAGNOSIS — E1122 Type 2 diabetes mellitus with diabetic chronic kidney disease: Secondary | ICD-10-CM

## 2015-09-14 DIAGNOSIS — Z794 Long term (current) use of insulin: Secondary | ICD-10-CM | POA: Diagnosis not present

## 2015-09-14 DIAGNOSIS — IMO0002 Reserved for concepts with insufficient information to code with codable children: Secondary | ICD-10-CM | POA: Insufficient documentation

## 2015-09-14 DIAGNOSIS — M7552 Bursitis of left shoulder: Secondary | ICD-10-CM | POA: Diagnosis not present

## 2015-09-14 DIAGNOSIS — Z23 Encounter for immunization: Secondary | ICD-10-CM

## 2015-09-14 MED ORDER — METHYLPREDNISOLONE 4 MG PO TBPK: ORAL_TABLET | ORAL | Status: DC

## 2015-09-14 NOTE — Progress Notes (Signed)
Date:  09/14/2015   Name:  [name]   DOB:  10/29/1940   MRN:  [id number]   Chief Complaint: Hypertension and Hyperlipidemia Shoulder pain - Patient complains of pain in his anterior left shoulder. This been there for several months without known injury. He denies weakness or tingling in his arm or hand. He is not taking any medication other than Tylenol. He tried a topical rub without benefit.  Hypertension This is a chronic problem. The current episode started more than 1 year ago. The problem is unchanged. The problem is controlled. Pertinent negatives include no palpitations or shortness of breath. Risk factors for coronary artery disease include diabetes mellitus. Past treatments include beta blockers. The current treatment provides significant improvement. There are no compliance problems.  Hypertensive end-organ damage includes a thyroid problem.  Hyperlipidemia This is a chronic problem. The problem is controlled. Recent lipid tests were reviewed and are normal. Pertinent negatives include no focal weakness, myalgias or shortness of breath. Current antihyperlipidemic treatment includes statins. There are no compliance problems.   Diabetes He presents for his follow-up (followed by Endocrinology) diabetic visit. He has type 2 diabetes mellitus. Pertinent negatives for hypoglycemia include no tremors. Pertinent negatives for diabetes include no weakness.  Thyroid Problem Visit type: followed by endocrinology. Patient reports no palpitations or tremors. Past treatments include levothyroxine (dose recently increased). His past medical history is significant for hyperlipidemia.     Review of Systems  Constitutional: Negative for unexpected weight change.  HENT: Negative for trouble swallowing.   Eyes: Negative for visual disturbance.  Respiratory: Negative for cough, choking and shortness of breath.   Cardiovascular: Negative for palpitations.  Gastrointestinal: Negative for abdominal  pain and blood in stool.  Musculoskeletal: Positive for arthralgias. Negative for myalgias.  Neurological: Negative for tremors, focal weakness, weakness and numbness.  Psychiatric/Behavioral: Negative for dysphoric mood.    Patient Active Problem List   Diagnosis Date Noted  . 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 Chronic kidney disease (CKD), stage III (moderate) 03/16/2015  . Diabetes mellitus with polyneuropathy ([place]) 03/16/2015  . CAD in native artery 01/20/2015  . Impaired renal function 01/20/2015  . Gout 01/20/2015  . Diabetic peripheral neuropathy ([place]) 01/20/2015  . H/O malignant neoplasm of colon 01/20/2015  . Adult hypothyroidism 01/20/2015  . Combined fat and carbohydrate induced hyperlipemia 01/20/2015  . Special screening for malignant neoplasm of prostate 01/20/2015  . Diabetes mellitus type 2, uncontrolled ([place]) 01/20/2015  . Encounter for long-term (current) use of insulin ([place]) 07/27/2014  . High potassium 05/19/2014  . Microalbuminuria 04/25/2014  . 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 Benign essential HTN 04/14/2014  . BP (high blood pressure) 07/15/2013    Prior to Admission medications   Medication Sig Start Date End Date Taking? Authorizing Provider  aspirin EC 325 MG tablet Take 325 mg by mouth daily.   Yes Historical Provider, MD  atenolol (TENORMIN) 25 MG tablet Take 25 mg by mouth daily.   Yes Historical Provider, MD  insulin NPH Human (HUMULIN N,NOVOLIN N) 100 UNIT/ML injection Inject 30 Units into the skin 2 (two) times daily.   Yes Historical Provider, MD  insulin regular (NOVOLIN R,HUMULIN R) 100 units/mL injection Inject 15-16 Units into the skin 2 (two) times daily. Pt takes depending on blood sugar.   Yes Historical Provider, MD  levothyroxine (SYNTHROID, LEVOTHROID) 300 MCG tablet Take 300 mcg by mouth daily before breakfast.   Yes Historical Provider, MD  ranitidine (  ZANTAC) 150 MG tablet Take 150 mg  by mouth 2 (two) times daily as needed for heartburn.   Yes Historical Provider, MD  simvastatin (ZOCOR) 20 MG tablet TAKE ONE TABLET BY MOUTH AT BEDTIME 07/17/15  Yes Glean Hess, MD    Allergies  Allergen Reactions  . Ace Inhibitors Other (See Comments)    Reaction:  Raises potassium   . Gabapentin Other (See Comments)    Past Surgical History  Procedure Laterality Date  . Prostate surgery  2002    BPH benign pathology  . Coronary artery bypass graft  1997    x 3  . Spine surgery  1989    Lumbar disc  . 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 Appendectomy  2011  . Hernia repair  2011    Ventral hernia    Social History  Substance Use Topics  . Smoking status: Former Smoker    Quit date: 06/24/1996  . Smokeless tobacco: None  . Alcohol Use: No     Medication list has been reviewed and updated.   Physical Exam  Constitutional: He is oriented to person, place, and time. He appears well-developed. No distress.  HENT:  Head: Normocephalic and atraumatic.  Eyes: Conjunctivae are normal. Right eye exhibits no discharge. Left eye exhibits no discharge. No scleral icterus.  Neck: Normal range of motion. No thyromegaly present.  Cardiovascular: Normal rate, regular rhythm and normal heart sounds.   Pulmonary/Chest: Effort normal and breath sounds normal. No respiratory distress. He has no wheezes.  Musculoskeletal: Normal range of motion. He exhibits no edema.       Left shoulder: He exhibits tenderness (anterior upper arm c/w bursitis).  Neurological: He is alert and oriented to person, place, and time.  Skin: Skin is warm and dry. No rash noted.  Psychiatric: He has a normal mood and affect. His behavior is normal. Thought content normal.  Nursing note and vitals reviewed.   BP 122/62 mmHg  Pulse 64  Ht 6\' 1"  (1.854 m)  Wt 261 lb 9.6 oz (118.661 kg)  BMI 34.52 kg/m2  Assessment and Plan: 1. Shoulder bursitis, left Patient advised to  avoid nonsteroidals - methylPREDNISolone (MEDROL DOSEPAK) 4 MG TBPK tablet; Take 6 pills on day 1 the 5 pills day 2 then 4 pills day 3 then 3 pills day 4 then 2 pills day 5 then one pills day 6 then stop  Dispense: 21 tablet; Refill: 0  2. 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 Diabetic polyneuropathy associated with type 2 diabetes mellitus ([place]) Patient reports stable symptoms  4. Flu vaccine need - Flu Vaccine QUAD 36+ mos PF IM (Fluarix & Fluzone Quad PF)  5. Uncontrolled type 2 diabetes mellitus with stage 4 chronic kidney disease, with long-term current use of insulin ([place]) Followed by endocrinology Recent A1c was elevated. Appropriate diet as well as exercise is discussed.   Halina Maidens, MD [facility]  09/14/2015

## 2015-09-15 ENCOUNTER — Ambulatory Visit: Payer: PPO | Admitting: Internal Medicine

## 2015-10-05 ENCOUNTER — Other Ambulatory Visit: Payer: Self-pay | Admitting: Internal Medicine

## 2015-11-18 ENCOUNTER — Encounter: Payer: Self-pay | Admitting: Gynecology

## 2015-11-18 ENCOUNTER — Ambulatory Visit (INDEPENDENT_AMBULATORY_CARE_PROVIDER_SITE_OTHER): Payer: PPO

## 2015-11-18 ENCOUNTER — Ambulatory Visit
Admission: EM | Admit: 2015-11-18 | Discharge: 2015-11-18 | Disposition: A | Discharge status: Home / Self Care | Payer: PPO | Attending: Family Medicine | Admitting: Family Medicine

## 2015-11-18 DIAGNOSIS — J0110 Acute frontal sinusitis, unspecified: Secondary | ICD-10-CM

## 2015-11-18 DIAGNOSIS — J40 Bronchitis, not specified as acute or chronic: Secondary | ICD-10-CM

## 2015-11-18 DIAGNOSIS — R05 Cough: Secondary | ICD-10-CM | POA: Diagnosis not present

## 2015-11-18 LAB — RAPID STREP SCREEN (MED CTR MEBANE ONLY): STREPTOCOCCUS, GROUP A SCREEN (DIRECT): NEGATIVE

## 2015-11-18 MED ORDER — DOXYCYCLINE HYCLATE 100 MG PO CAPS: 100.0000 mg | ORAL_CAPSULE | Freq: Two times a day (BID) | ORAL | Status: DC

## 2015-11-18 NOTE — Discharge Instructions (Signed)
Take medication as prescribed. Rest. Eat and drink regularly.   Follow up closely with your primary care physician.   Return to Urgent care for new or worsening concerns.   Sinusitis, Adult Sinusitis is redness, soreness, and inflammation of the paranasal sinuses. Paranasal sinuses are air pockets within the bones of your face. They are located beneath your eyes, in the middle of your forehead, and above your eyes. In healthy paranasal sinuses, mucus is able to drain out, and air is able to circulate through them by way of your nose. However, when your paranasal sinuses are inflamed, mucus and air can become trapped. This can allow bacteria and other germs to grow and cause infection. Sinusitis can develop quickly and last only a short time (acute) or continue over a long period (chronic). Sinusitis that lasts for more than 12 weeks is considered chronic. CAUSES Causes of sinusitis include:  Allergies.  Structural abnormalities, such as displacement of the cartilage that separates your nostrils (deviated septum), which can decrease the air flow through your nose and sinuses and affect sinus drainage.  Functional abnormalities, such as when the small hairs (cilia) that line your sinuses and help remove mucus do not work properly or are not present. SIGNS AND SYMPTOMS Symptoms of acute and chronic sinusitis are the same. The primary symptoms are pain and pressure around the affected sinuses. Other symptoms include:  Upper toothache.  Earache.  Headache.  Bad breath.  Decreased sense of smell and taste.  A cough, which worsens when you are lying flat.  Fatigue.  Fever.  Thick drainage from your nose, which often is green and may contain pus (purulent).  Swelling and warmth over the affected sinuses. DIAGNOSIS Your health care provider will perform a physical exam. During your exam, your health care provider may perform any of the following to help determine if you have acute  sinusitis or chronic sinusitis:  Look in your nose for signs of abnormal growths in your nostrils (nasal polyps).  Tap over the affected sinus to check for signs of infection.  View the inside of your sinuses using an imaging device that has a light attached (endoscope). If your health care provider suspects that you have chronic sinusitis, one or more of the following tests may be recommended:  Allergy tests.  Nasal culture. A sample of mucus is taken from your nose, sent to a lab, and screened for bacteria.  Nasal cytology. A sample of mucus is taken from your nose and examined by your health care provider to determine if your sinusitis is related to an allergy. TREATMENT Most cases of acute sinusitis are related to a viral infection and will resolve on their own within 10 days. Sometimes, medicines are prescribed to help relieve symptoms of both acute and chronic sinusitis. These may include pain medicines, decongestants, nasal steroid sprays, or saline sprays. However, for sinusitis related to a bacterial infection, your health care provider will prescribe antibiotic medicines. These are medicines that will help kill the bacteria causing the infection. Rarely, sinusitis is caused by a fungal infection. In these cases, your health care provider will prescribe antifungal medicine. For some cases of chronic sinusitis, surgery is needed. Generally, these are cases in which sinusitis recurs more than 3 times per year, despite other treatments. HOME CARE INSTRUCTIONS  Drink plenty of water. Water helps thin the mucus so your sinuses can drain more easily.  Use a humidifier.  Inhale steam 3-4 times a day (for example, sit in the bathroom  with the shower running).  Apply a warm, moist washcloth to your face 3-4 times a day, or as directed by your health care provider.  Use saline nasal sprays to help moisten and clean your sinuses.  Take medicines only as directed by your health care  provider.  If you were prescribed either an antibiotic or antifungal medicine, finish it all even if you start to feel better. SEEK IMMEDIATE MEDICAL CARE IF:  You have increasing pain or severe headaches.  You have nausea, vomiting, or drowsiness.  You have swelling around your face.  You have vision problems.  You have a stiff neck.  You have difficulty breathing.   This information is not intended to replace advice given to you by your health care provider. Make sure you discuss any questions you have with your health care provider.   Document Released: 09/30/2005 Document Revised: 10/21/2014 Document Reviewed: 10/15/2011 Elsevier Interactive Patient Education [employer].

## 2015-11-18 NOTE — ED Provider Notes (Signed)
[facility]  ____________________________________________  Time seen: Approximately 12:19 PM  I have reviewed the triage vital signs and the nursing notes.   HISTORY  Chief Complaint Sore Throat; Nasal Congestion; and Cough  HPI [name] is a 75 y.o. male presents with wife at bedside for complaints of 3-4 weeks of runny nose, nasal congestion, sinus drainage and cough. Patient reports that frequently blowing his nose and getting thick mucus out as well as occasionally coughing up thick mucus. Patient reports that he does feel he has chest congestion. Denies wheezing or shortness of breath. Denies chest pain. Denies known fevers. Reports symptoms have been unresolved with over-the-counter medications.  Reports multiple sick contacts throughout his family. Reports has continued to eat and drink well. Patient reports that he has a history of pneumonia and states he normally developed pneumonia approximately once per year and is concerned that he may have developed pneumonia.  Denies chest pain or shortness breath, abdominal pain, dizziness, weakness, fevers, weight loss.  PCP: Army Melia   History reviewed. No pertinent past medical history.   Patient Active Problem List   Diagnosis Date Noted  . 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 CKD stage 4 due to type 2 diabetes mellitus ([place]) 03/16/2015  . Diabetes mellitus with polyneuropathy ([place]) 03/16/2015  . CAD in native artery 01/20/2015  . Gout 01/20/2015  . Diabetic peripheral neuropathy ([place]) 01/20/2015  . H/O malignant neoplasm of colon 01/20/2015  . Adult hypothyroidism 01/20/2015  . Combined fat and carbohydrate induced hyperlipemia 01/20/2015  . Encounter for long-term (current) use of insulin ([place]) 07/27/2014  . High potassium 05/19/2014  . 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 Benign essential HTN 04/14/2014  . Hypertension in stage 4 chronic kidney disease due to type 2 diabetes mellitus ([place]) 07/15/2013    Past Surgical History  Procedure Laterality Date  . Prostate surgery  2002    BPH benign pathology  . Coronary artery bypass graft  1997    x 3  . Spine surgery  1989    Lumbar disc  . 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 Appendectomy  2011  . Hernia repair  2011    Ventral hernia    Current Outpatient Rx  Name  Route  Sig  Dispense  Refill  . aspirin EC 325 MG tablet   Oral   Take 325 mg by mouth daily.         Marland Kitchen atenolol (TENORMIN) 25 MG tablet      TAKE ONE TABLET BY MOUTH ONCE DAILY   30 tablet   5   . insulin NPH Human (HUMULIN N,NOVOLIN N) 100 UNIT/ML injection   Subcutaneous   Inject 30 Units into the skin 2 (two) times daily.         . insulin regular (NOVOLIN R,HUMULIN R) 100 units/mL injection   Subcutaneous   Inject 15-16 Units into the skin 2 (two) times daily. Pt takes depending on blood sugar.         . levothyroxine (SYNTHROID, LEVOTHROID) 300 MCG tablet   Oral   Take 300 mcg by mouth daily before breakfast.         . ranitidine (ZANTAC) 150 MG tablet   Oral   Take 150 mg by mouth 2 (two) times daily as  needed for heartburn.         . simvastatin (ZOCOR) 20 MG tablet      TAKE ONE TABLET BY MOUTH AT BEDTIME   30 tablet   5   .             Allergies Ace inhibitors and Gabapentin  Family History  Problem Relation Age of Onset  . Cancer Mother     Lung  . Diabetes Mother   . Heart disease Mother     Social History Social History  Substance Use Topics  . Smoking status: Former Smoker    Quit date: 06/24/1996  . Smokeless tobacco: None  . Alcohol Use: No    Review of Systems Constitutional: No fever/chills Eyes: No visual changes. ENT: No sore throat. Positive runny nose, nasal congestion, sinus pressure and cough.   Cardiovascular: Denies chest pain. Respiratory: Denies shortness of breath. Gastrointestinal: No abdominal pain.  No nausea, no vomiting.  No diarrhea.  No constipation. Genitourinary: Negative for dysuria. Musculoskeletal: Negative for back pain. Skin: Negative for rash. Neurological: Negative for headaches, focal weakness or numbness.  10-point ROS otherwise negative.  ____________________________________________   PHYSICAL EXAM:  VITAL SIGNS: ED Triage Vitals  Enc Vitals Group     BP 11/18/15 1158 151/93 mmHg     Pulse Rate 11/18/15 1158 72     Resp 11/18/15 1158 16     Temp 11/18/15 1158 97.6 F (36.4 C)     Temp Source 11/18/15 1158 Oral     SpO2 11/18/15 1158 95 %     Weight 11/18/15 1158 250 lb (113.399 kg)     Height 11/18/15 1158 6\' 2"  (1.88 m)     Head Cir --      Peak Flow --      Pain Score 11/18/15 1202 3     Pain Loc --      Pain Edu? --      Excl. in [place]? --    Constitutional: Alert and oriented. Well appearing and in no acute distress. Eyes: Conjunctivae are normal. PERRL. EOMI. Head: Atraumatic.Mild to moderate tenderness to palpation frontal sinuses. No maxillary sinus tenderness to palpation. No swelling. No erythema.   Ears: no erythema, normal TMs bilaterally.   Nose: nasal congestion with bilateral nasal turbinate erythema and edema.   Mouth/Throat: Mucous membranes are moist.  Oropharynx non-erythematous.No tonsillar swelling or exudate.  Neck: No stridor.  No cervical spine tenderness to palpation. Hematological/Lymphatic/Immunilogical: No cervical lymphadenopathy. Cardiovascular: Normal rate, regular rhythm. Grossly normal heart sounds.  Good peripheral circulation. Respiratory: Normal respiratory effort.  No retractions. Diffuse rhonchi. No wheezes or rales. No focal area of consolidation auscultated. Good air movement. Dry intermittent cough in room. Gastrointestinal: Soft and nontender. No distention. Normal Bowel sounds. No CVA  tenderness. Musculoskeletal: No lower or upper extremity tenderness nor edema.  Bilateral pedal pulses equal and easily palpated. No cervical, thoracic or lumbar tenderness to palpation.  Neurologic:  Normal speech and language. No gross focal neurologic deficits are appreciated. No gait instability. Skin:  Skin is warm, dry and intact. No rash noted. Psychiatric: Mood and affect are normal. Speech and behavior are normal.  ____________________________________________   LABS (all labs ordered are listed, but only abnormal results are displayed)  Labs Reviewed  RAPID STREP SCREEN (NOT AT [facility])  CULTURE, GROUP A STREP [facility])    RADIOLOGY EXAM: CHEST 2 VIEW  COMPARISON: Radiograph 12/13/2013  FINDINGS: Sternotomy wires overlie normal cardiac silhouette. Elevated RIGHT hemidiaphragm. No effusion,  infiltrate, pneumothorax. Degenerate spurring spine.  IMPRESSION: Chronic elevation of the RIGHT hemidiaphragm. No acute findings. Hyperinflated lungs.   Electronically Signed By: Suzy Bouchard M.D. On: 11/18/2015 12:38  I, Marylene Land, personally viewed and evaluated these images (plain radiographs) as part of my medical decision making, as well as reviewing the written report by the radiologist.   [place] / [place] / ED COURSE  Pertinent labs & imaging results that were available during my care of the patient were reviewed by me and considered in my medical decision making (see chart for details).  Well-appearing patient. No acute distress. Presents for the last 3-4 weeks of runny nose, nasal congestion, sinus rinse, sinus pressure and cough. Patient reports productive cough intermittently as well as frequent thick drainage from blowing nose. Denies chest pain or shortness breath. Diffuse rhonchi. Abdomen soft and nontender. Moist mucous membranes. Will evaluate chest x-ray.   Chest x-ray per radiologist, chronic elevation of the right  hemidiaphragm, no acute findings, hyperinflated lungs.  Will treat frontal sinusitis and bronchitis with oral doxycycline. Encourage rest, fluids and pcp follow up. Patient denies need for cough medications, states has some at home if needed.   Discussed follow up with Primary care physician this week. Discussed follow up and return parameters including no resolution or any worsening concerns. Patient verbalized understanding and agreed to plan.   ____________________________________________   FINAL CLINICAL IMPRESSION(S) / ED DIAGNOSES  Final diagnoses:  Acute frontal sinusitis, recurrence not specified  Bronchitis      Note: This dictation was prepared with Dragon dictation along with smaller phrase technology. Any transcriptional errors that result from this process are unintentional.    Marylene Land, NP 11/18/15 1337

## 2015-11-18 NOTE — ED Notes (Signed)
Patient states that he has had a cough, runny nose, and sore throat that started during the first week of January.

## 2015-11-20 LAB — CULTURE, GROUP A STREP (THRC)

## 2015-12-12 DIAGNOSIS — Z794 Long term (current) use of insulin: Secondary | ICD-10-CM | POA: Diagnosis not present

## 2015-12-12 DIAGNOSIS — E1165 Type 2 diabetes mellitus with hyperglycemia: Secondary | ICD-10-CM | POA: Diagnosis not present

## 2015-12-12 DIAGNOSIS — E063 Autoimmune thyroiditis: Secondary | ICD-10-CM | POA: Diagnosis not present

## 2015-12-12 DIAGNOSIS — E038 Other specified hypothyroidism: Secondary | ICD-10-CM | POA: Diagnosis not present

## 2015-12-19 DIAGNOSIS — E1122 Type 2 diabetes mellitus with diabetic chronic kidney disease: Secondary | ICD-10-CM | POA: Diagnosis not present

## 2015-12-19 DIAGNOSIS — E1165 Type 2 diabetes mellitus with hyperglycemia: Secondary | ICD-10-CM | POA: Diagnosis not present

## 2015-12-19 DIAGNOSIS — E669 Obesity, unspecified: Secondary | ICD-10-CM | POA: Diagnosis not present

## 2015-12-19 DIAGNOSIS — E063 Autoimmune thyroiditis: Secondary | ICD-10-CM | POA: Diagnosis not present

## 2015-12-19 DIAGNOSIS — Z794 Long term (current) use of insulin: Secondary | ICD-10-CM | POA: Diagnosis not present

## 2015-12-19 DIAGNOSIS — E038 Other specified hypothyroidism: Secondary | ICD-10-CM | POA: Diagnosis not present

## 2015-12-19 DIAGNOSIS — E1142 Type 2 diabetes mellitus with diabetic polyneuropathy: Secondary | ICD-10-CM | POA: Diagnosis not present

## 2015-12-19 DIAGNOSIS — N184 Chronic kidney disease, stage 4 (severe): Secondary | ICD-10-CM | POA: Diagnosis not present

## 2015-12-26 DIAGNOSIS — E039 Hypothyroidism, unspecified: Secondary | ICD-10-CM | POA: Diagnosis not present

## 2015-12-26 DIAGNOSIS — Z794 Long term (current) use of insulin: Secondary | ICD-10-CM | POA: Diagnosis not present

## 2015-12-26 DIAGNOSIS — E11649 Type 2 diabetes mellitus with hypoglycemia without coma: Secondary | ICD-10-CM | POA: Diagnosis not present

## 2016-02-19 DIAGNOSIS — M1A9XX0 Chronic gout, unspecified, without tophus (tophi): Secondary | ICD-10-CM | POA: Diagnosis not present

## 2016-02-19 DIAGNOSIS — E1165 Type 2 diabetes mellitus with hyperglycemia: Secondary | ICD-10-CM | POA: Diagnosis not present

## 2016-02-19 DIAGNOSIS — E875 Hyperkalemia: Secondary | ICD-10-CM | POA: Diagnosis not present

## 2016-02-19 DIAGNOSIS — E1122 Type 2 diabetes mellitus with diabetic chronic kidney disease: Secondary | ICD-10-CM | POA: Diagnosis not present

## 2016-02-19 DIAGNOSIS — I129 Hypertensive chronic kidney disease with stage 1 through stage 4 chronic kidney disease, or unspecified chronic kidney disease: Secondary | ICD-10-CM | POA: Diagnosis not present

## 2016-02-19 DIAGNOSIS — N183 Chronic kidney disease, stage 3 (moderate): Secondary | ICD-10-CM | POA: Diagnosis not present

## 2016-02-19 DIAGNOSIS — N400 Enlarged prostate without lower urinary tract symptoms: Secondary | ICD-10-CM | POA: Diagnosis not present

## 2016-03-19 ENCOUNTER — Encounter: Payer: Self-pay | Admitting: Internal Medicine

## 2016-03-19 ENCOUNTER — Ambulatory Visit (INDEPENDENT_AMBULATORY_CARE_PROVIDER_SITE_OTHER): Payer: PPO | Admitting: Internal Medicine

## 2016-03-19 VITALS — BP 130/86 | HR 80 | Resp 16 | Ht 72.0 in | Wt 255.0 lb

## 2016-03-19 DIAGNOSIS — E1165 Type 2 diabetes mellitus with hyperglycemia: Secondary | ICD-10-CM | POA: Diagnosis not present

## 2016-03-19 DIAGNOSIS — I129 Hypertensive chronic kidney disease with stage 1 through stage 4 chronic kidney disease, or unspecified chronic kidney disease: Secondary | ICD-10-CM | POA: Diagnosis not present

## 2016-03-19 DIAGNOSIS — N184 Chronic kidney disease, stage 4 (severe): Secondary | ICD-10-CM

## 2016-03-19 DIAGNOSIS — IMO0002 Reserved for concepts with insufficient information to code with codable children: Secondary | ICD-10-CM

## 2016-03-19 DIAGNOSIS — E1122 Type 2 diabetes mellitus with diabetic chronic kidney disease: Secondary | ICD-10-CM | POA: Diagnosis not present

## 2016-03-19 DIAGNOSIS — E1169 Type 2 diabetes mellitus with other specified complication: Secondary | ICD-10-CM | POA: Diagnosis not present

## 2016-03-19 DIAGNOSIS — Z794 Long term (current) use of insulin: Secondary | ICD-10-CM

## 2016-03-19 DIAGNOSIS — Z23 Encounter for immunization: Secondary | ICD-10-CM | POA: Diagnosis not present

## 2016-03-19 DIAGNOSIS — E785 Hyperlipidemia, unspecified: Secondary | ICD-10-CM | POA: Diagnosis not present

## 2016-03-19 DIAGNOSIS — E1142 Type 2 diabetes mellitus with diabetic polyneuropathy: Secondary | ICD-10-CM

## 2016-03-19 NOTE — Progress Notes (Signed)
Date:  03/19/2016   Name:  [name]   DOB:  17-Oct-1940   MRN:  [id number]   Chief Complaint: Hypertension and Hyperlipidemia Hypertension This is a chronic problem. The current episode started more than 1 year ago. The problem is uncontrolled. Pertinent negatives include no chest pain, palpitations or shortness of breath. Past treatments include beta blockers. The current treatment provides moderate improvement. There are no compliance problems.   Hyperlipidemia This is a chronic problem. The current episode started more than 1 year ago. The problem is controlled. Recent lipid tests were reviewed and are normal. Pertinent negatives include no chest pain, leg pain, myalgias or shortness of breath. Current antihyperlipidemic treatment includes statins.   Diabetes/Hypothyroidism - followed by Endocrinology.  Last A1C 9.1 - his novolog dose was changed.  TSH was elevated and dose of thyroid medication was increased. He is due for an eye exam.  He is due for Prevnar-13.  Basic Metabolic Panel (BMP) (83/29/1916 11:30 AM) Basic Metabolic Panel (BMP) (60/60/0459 11:30 AM)  Component Value Ref Range  Glucose 198 (H) 70 - 110 mg/dL  Sodium 137 136 - 145 mmol/L  Potassium 5.1 3.6 - 5.1 mmol/L  Chloride 107 97 - 109 mmol/L  Carbon Dioxide (CO2) 23.5 22.0 - 32.0 mmol/L  Calcium 8.7 8.7 - 10.3 mg/dL  Urea Nitrogen (BUN) 38 (H) 7 - 25 mg/dL  Creatinine 2.6 (H) 0.7 - 1.3 mg/dL  Glomerular Filtration Rate (eGFR), MDRD Estimate 24 (L) >60 mL/min/1.73sq m  BUN/Crea Ratio 14.6 6.0 - 20.0  Anion Gap w/K 11.6 6.0 - 16.0   Hemoglobin A1C (12/12/2015 11:30 AM) Hemoglobin A1C (12/12/2015 11:30 AM)  Component Value Ref Range  Hemoglobin A1C 9.1 (H) 4.2 - 5.6 %  Average Blood Glucose (Calc) 214 mg/dL   Thyroid Stimulating-Hormone (TSH) (12/12/2015 11:30 AM) Thyroid Stimulating-Hormone (TSH) (12/12/2015 11:30 AM)  Component Value Ref Range  Thyroid Stimulating Hormone (TSH) 18.052 (H) 0.340 - 5.600  uIU/mL      Review of Systems  Constitutional: Negative for fever, chills and unexpected weight change.  HENT: Negative for hearing loss, tinnitus and trouble swallowing.   Eyes: Positive for visual disturbance.  Respiratory: Negative for cough, chest tightness, shortness of breath and wheezing.   Cardiovascular: Negative for chest pain, palpitations and leg swelling.  Gastrointestinal: Negative for abdominal pain.  Endocrine: Negative for polydipsia and polyuria.  Genitourinary: Negative for dysuria.  Musculoskeletal: Positive for arthralgias (shoulders). Negative for myalgias.  Skin: Negative for color change, rash and wound.  Neurological: Negative for dizziness, tremors, seizures and numbness (in feet resolved).  Psychiatric/Behavioral: Negative for sleep disturbance and dysphoric mood.    Patient Active Problem List   Diagnosis Date Noted  . 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 CKD stage 4 due to type 2 diabetes mellitus ([place]) 03/16/2015  . Diabetes mellitus with polyneuropathy ([place]) 03/16/2015  . CAD in native artery 01/20/2015  . Gout 01/20/2015  . Diabetic peripheral neuropathy ([place]) 01/20/2015  . H/O malignant neoplasm of colon 01/20/2015  . Adult hypothyroidism 01/20/2015  . Hyperlipidemia associated with type 2 diabetes mellitus ([place]) 01/20/2015  . Encounter for long-term (current) use of insulin ([place]) 07/27/2014  . High potassium 05/19/2014  . 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 Benign essential HTN 04/14/2014  . Hypertension in stage 4 chronic kidney disease due to type  2 diabetes mellitus ([place]) 07/15/2013    Prior to Admission medications   Medication Sig Start Date End Date Taking? Authorizing Provider  aspirin EC 325 MG tablet Take 325 mg by mouth daily.   Yes Historical Provider, MD   atenolol (TENORMIN) 25 MG tablet TAKE ONE TABLET BY MOUTH ONCE DAILY 10/05/15  Yes Glean Hess, MD  Blood Glucose Monitoring Suppl ([place]) [place]  01/29/14  Yes Historical Provider, MD  gabapentin (NEURONTIN) 100 MG capsule  05/11/15  Yes Historical Provider, MD  glucose blood (RELION PRIME TEST) test strip  02/20/15  Yes Historical Provider, MD  insulin NPH Human (HUMULIN N,NOVOLIN N) 100 UNIT/ML injection Inject 30 Units into the skin 2 (two) times daily.   Yes Historical Provider, MD  insulin regular (NOVOLIN R,HUMULIN R) 100 units/mL injection Inject 15-16 Units into the skin 2 (two) times daily. Pt takes depending on blood sugar.   Yes Historical Provider, MD  levothyroxine (SYNTHROID, LEVOTHROID) 300 MCG tablet Take 300 mcg by mouth daily before breakfast.   Yes Historical Provider, MD  ranitidine (ZANTAC) 150 MG tablet Take 150 mg by mouth 2 (two) times daily as needed for heartburn.   Yes Historical Provider, MD  simvastatin (ZOCOR) 20 MG tablet TAKE ONE TABLET BY MOUTH AT BEDTIME 07/17/15  Yes Glean Hess, MD    Allergies  Allergen Reactions  . Ace Inhibitors Other (See Comments)    Reaction:  Raises potassium   . Gabapentin Other (See Comments)    Past Surgical History  Procedure Laterality Date  . Prostate surgery  2002    BPH benign pathology  . Coronary artery bypass graft  1997    x 3  . Spine surgery  1989    Lumbar disc  . 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 Appendectomy  2011  . Hernia repair  2011    Ventral hernia    Social History  Substance Use Topics  . Smoking status: Former Smoker    Quit date: 06/24/1996  . Smokeless tobacco: None  . Alcohol Use: No    Medication list has been reviewed and updated.   Physical Exam  Constitutional: He is oriented to person, place, and time. He appears well-developed. No distress.  HENT:  Head: Normocephalic and atraumatic.  Neck: Normal range of motion. Neck  supple. Carotid bruit is not present. No thyromegaly present.  Cardiovascular: Normal rate, regular rhythm and normal heart sounds.   Pulmonary/Chest: Effort normal and breath sounds normal. No respiratory distress. He has no wheezes. He has no rales.  Musculoskeletal: He exhibits no edema or tenderness.  Neurological: He is alert and oriented to person, place, and time.  Skin: Skin is warm and dry. No rash noted.  Psychiatric: He has a normal mood and affect. His speech is normal and behavior is normal. Thought content normal.  Nursing note and vitals reviewed.   BP 130/86 mmHg  Pulse 80  Resp 16  Ht 6' (1.829 m)  Wt 255 lb (115.667 kg)  BMI 34.58 kg/m2  SpO2 95%  Assessment and Plan: 1. Hypertension in stage 4 chronic kidney disease due to type 2 diabetes mellitus ([place]) Controlled - consider adding ARB for renal protection  2. Hyperlipidemia associated with type 2 diabetes mellitus ([place]) On statins - check lipids and hepatic panel - Lipid panel  3. Diabetic polyneuropathy associated with type 2 diabetes mellitus ([place]) Improved per patient - Hepatic function panel  4.  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 Need for pneumococcal vaccination - Pneumococcal conjugate vaccine 13-valent IM   Halina Maidens, MD [facility]  03/19/2016

## 2016-03-19 NOTE — Patient Instructions (Signed)
Pneumococcal Conjugate Vaccine (PCV13)   1. Why get vaccinated?  Vaccination can protect both children and adults from pneumococcal disease.  Pneumococcal disease is caused by bacteria that can spread from person to person through close contact. It can cause ear infections, and it can also lead to more serious infections of the:  · Lungs (pneumonia),  · Blood (bacteremia), and  · Covering of the brain and spinal cord (meningitis).  Pneumococcal pneumonia is most common among adults. Pneumococcal meningitis can cause deafness and brain damage, and it kills about 1 child in 10 who get it.  Anyone can get pneumococcal disease, but children under 2 years of age and adults 65 years and older, people with certain medical conditions, and cigarette smokers are at the highest risk.  Before there was a vaccine, the [place] saw:  · more than 700 cases of meningitis,  · about 13,000 blood infections,  · about 5 million ear infections, and  · about 200 deaths  in children under 5 each year from pneumococcal disease. Since vaccine became available, severe pneumococcal disease in these children has fallen by 88%.  About 18,000 older adults die of pneumococcal disease each year in the [place].  Treatment of pneumococcal infections with penicillin and other drugs is not as effective as it used to be, because some strains of the disease have become resistant to these drugs. This makes prevention of the disease, through vaccination, even more important.  2. PCV13 vaccine  Pneumococcal conjugate vaccine (called PCV13) protects against 13 types of pneumococcal bacteria.  PCV13 is routinely given to children at 2, 4, 6, and 12-15 months of age. It is also recommended for children and adults 2 to 64 years of age with certain health conditions, and for all adults 65 years of age and older. Your doctor can give you details.  3. Some people should not get this vaccine  Anyone who has ever had a life-threatening allergic reaction  to a dose of this vaccine, to an earlier pneumococcal vaccine called PCV7, or to any vaccine containing diphtheria toxoid (for example, DTaP), should not get PCV13.  Anyone with a severe allergy to any component of PCV13 should not get the vaccine. Tell your doctor if the person being vaccinated has any severe allergies.  If the person scheduled for vaccination is not feeling well, your healthcare provider might decide to reschedule the shot on another day.  4. Risks of a vaccine reaction  With any medicine, including vaccines, there is a chance of reactions. These are usually mild and go away on their own, but serious reactions are also possible.  Problems reported following PCV13 varied by age and dose in the series. The most common problems reported among children were:  · About half became drowsy after the shot, had a temporary loss of appetite, or had redness or tenderness where the shot was given.  · About 1 out of 3 had swelling where the shot was given.  · About 1 out of 3 had a mild fever, and about 1 in 20 had a fever over 102.2°F.  · Up to about 8 out of 10 became fussy or irritable.  Adults have reported pain, redness, and swelling where the shot was given; also mild fever, fatigue, headache, chills, or muscle pain.  Young children who get PCV13 along with inactivated flu vaccine at the same time may be at increased risk for seizures caused by fever. Ask your doctor for more information.  Problems that   could happen after any vaccine:  · People sometimes faint after a medical procedure, including vaccination. Sitting or lying down for about 15 minutes can help prevent fainting, and injuries caused by a fall. Tell your doctor if you feel dizzy, or have vision changes or ringing in the ears.  · Some older children and adults get severe pain in the shoulder and have difficulty moving the arm where a shot was given. This happens very rarely.  · Any medication can cause a severe allergic reaction. Such  reactions from a vaccine are very rare, estimated at about 1 in a million doses, and would happen within a few minutes to a few hours after the vaccination.  As with any medicine, there is a very small chance of a vaccine causing a serious injury or death.  The safety of vaccines is always being monitored. For more information, visit: www.cdc.gov/vaccinesafety/  5. What if there is a serious reaction?  What should I look for?  · Look for anything that concerns you, such as signs of a severe allergic reaction, very high fever, or unusual behavior.  Signs of a severe allergic reaction can include hives, swelling of the face and throat, difficulty breathing, a fast heartbeat, dizziness, and weakness-usually within a few minutes to a few hours after the vaccination.  What should I do?  · If you think it is a severe allergic reaction or other emergency that can't wait, call 9-1-1 or get the person to the nearest hospital. Otherwise, call your doctor.  Reactions should be reported to the Vaccine Adverse Event Reporting System (VAERS). Your doctor should file this report, or you can do it yourself through the VAERS web site at www.vaers.hhs.gov, or by calling 1-800-822-7967.  VAERS does not give medical advice.  6. The National Vaccine Injury Compensation Program  The National Vaccine Injury Compensation Program (VICP) is a federal program that was created to compensate people who may have been injured by certain vaccines.  Persons who believe they may have been injured by a vaccine can learn about the program and about filing a claim by calling 1-800-338-2382 or visiting the VICP website at www.hrsa.gov/vaccinecompensation. There is a time limit to file a claim for compensation.  7. How can I learn more?  · Ask your healthcare provider. He or she can give you the vaccine package insert or suggest other sources of information.  · Call your local or state health department.  · Contact the Centers for Disease Control and  Prevention (CDC):    Call 1-800-232-4636 (1-800-CDC-INFO) or    Visit CDC's website at www.cdc.gov/vaccines  Vaccine Information Statement  PCV13 Vaccine (08/18/2014)     This information is not intended to replace advice given to you by your health care provider. Make sure you discuss any questions you have with your health care provider.     Document Released: 07/28/2006 Document Revised: 10/21/2014 Document Reviewed: 08/25/2014  Elsevier Interactive Patient Education ©2016 [employer]

## 2016-03-20 LAB — HEPATIC FUNCTION PANEL
ALK PHOS: 135 IU/L — AB (ref 39–117)
ALT: 22 IU/L (ref 0–44)
AST: 21 IU/L (ref 0–40)
Albumin: 3.9 g/dL (ref 3.5–4.8)
BILIRUBIN, DIRECT: 0.09 mg/dL (ref 0.00–0.40)
Bilirubin Total: 0.4 mg/dL (ref 0.0–1.2)
Total Protein: 7 g/dL (ref 6.0–8.5)

## 2016-03-20 LAB — LIPID PANEL
CHOL/HDL RATIO: 9.7 ratio — AB (ref 0.0–5.0)
Cholesterol, Total: 185 mg/dL (ref 100–199)
HDL: 19 mg/dL — AB (ref >39)
LDL Calculated: 109 mg/dL — ABNORMAL HIGH (ref 0–99)
TRIGLYCERIDES: 285 mg/dL — AB (ref 0–149)
VLDL Cholesterol Cal: 57 mg/dL — ABNORMAL HIGH (ref 5–40)

## 2016-04-19 ENCOUNTER — Encounter: Payer: Self-pay | Admitting: Emergency Medicine

## 2016-04-19 ENCOUNTER — Emergency Department: Payer: PPO

## 2016-04-19 ENCOUNTER — Emergency Department
Admission: EM | Admit: 2016-04-19 | Discharge: 2016-04-19 | Disposition: A | Discharge status: Home / Self Care | Payer: PPO | Source: Home / Self Care | Attending: Student | Admitting: Student

## 2016-04-19 DIAGNOSIS — E1122 Type 2 diabetes mellitus with diabetic chronic kidney disease: Secondary | ICD-10-CM | POA: Insufficient documentation

## 2016-04-19 DIAGNOSIS — Z955 Presence of coronary angioplasty implant and graft: Secondary | ICD-10-CM

## 2016-04-19 DIAGNOSIS — I10 Essential (primary) hypertension: Secondary | ICD-10-CM | POA: Diagnosis not present

## 2016-04-19 DIAGNOSIS — Z794 Long term (current) use of insulin: Secondary | ICD-10-CM | POA: Insufficient documentation

## 2016-04-19 DIAGNOSIS — Z833 Family history of diabetes mellitus: Secondary | ICD-10-CM | POA: Diagnosis not present

## 2016-04-19 DIAGNOSIS — E039 Hypothyroidism, unspecified: Secondary | ICD-10-CM

## 2016-04-19 DIAGNOSIS — E785 Hyperlipidemia, unspecified: Secondary | ICD-10-CM | POA: Insufficient documentation

## 2016-04-19 DIAGNOSIS — Z8249 Family history of ischemic heart disease and other diseases of the circulatory system: Secondary | ICD-10-CM | POA: Diagnosis not present

## 2016-04-19 DIAGNOSIS — R112 Nausea with vomiting, unspecified: Secondary | ICD-10-CM

## 2016-04-19 DIAGNOSIS — Z79899 Other long term (current) drug therapy: Secondary | ICD-10-CM

## 2016-04-19 DIAGNOSIS — N189 Chronic kidney disease, unspecified: Secondary | ICD-10-CM | POA: Diagnosis not present

## 2016-04-19 DIAGNOSIS — N184 Chronic kidney disease, stage 4 (severe): Secondary | ICD-10-CM | POA: Insufficient documentation

## 2016-04-19 DIAGNOSIS — Z87891 Personal history of nicotine dependence: Secondary | ICD-10-CM | POA: Insufficient documentation

## 2016-04-19 DIAGNOSIS — Z951 Presence of aortocoronary bypass graft: Secondary | ICD-10-CM | POA: Diagnosis not present

## 2016-04-19 DIAGNOSIS — I129 Hypertensive chronic kidney disease with stage 1 through stage 4 chronic kidney disease, or unspecified chronic kidney disease: Secondary | ICD-10-CM

## 2016-04-19 DIAGNOSIS — R109 Unspecified abdominal pain: Secondary | ICD-10-CM | POA: Diagnosis not present

## 2016-04-19 DIAGNOSIS — I25719 Atherosclerosis of autologous vein coronary artery bypass graft(s) with unspecified angina pectoris: Secondary | ICD-10-CM | POA: Diagnosis not present

## 2016-04-19 DIAGNOSIS — Z7982 Long term (current) use of aspirin: Secondary | ICD-10-CM

## 2016-04-19 DIAGNOSIS — K810 Acute cholecystitis: Secondary | ICD-10-CM | POA: Diagnosis not present

## 2016-04-19 DIAGNOSIS — E119 Type 2 diabetes mellitus without complications: Secondary | ICD-10-CM | POA: Diagnosis not present

## 2016-04-19 DIAGNOSIS — Z85038 Personal history of other malignant neoplasm of large intestine: Secondary | ICD-10-CM | POA: Diagnosis not present

## 2016-04-19 DIAGNOSIS — Z801 Family history of malignant neoplasm of trachea, bronchus and lung: Secondary | ICD-10-CM | POA: Diagnosis not present

## 2016-04-19 DIAGNOSIS — R197 Diarrhea, unspecified: Secondary | ICD-10-CM

## 2016-04-19 DIAGNOSIS — R1013 Epigastric pain: Secondary | ICD-10-CM

## 2016-04-19 DIAGNOSIS — I2510 Atherosclerotic heart disease of native coronary artery without angina pectoris: Secondary | ICD-10-CM

## 2016-04-19 DIAGNOSIS — Z859 Personal history of malignant neoplasm, unspecified: Secondary | ICD-10-CM | POA: Insufficient documentation

## 2016-04-19 DIAGNOSIS — E875 Hyperkalemia: Secondary | ICD-10-CM | POA: Diagnosis not present

## 2016-04-19 DIAGNOSIS — R079 Chest pain, unspecified: Secondary | ICD-10-CM | POA: Diagnosis not present

## 2016-04-19 DIAGNOSIS — R52 Pain, unspecified: Secondary | ICD-10-CM

## 2016-04-19 DIAGNOSIS — E860 Dehydration: Secondary | ICD-10-CM | POA: Diagnosis not present

## 2016-04-19 DIAGNOSIS — I252 Old myocardial infarction: Secondary | ICD-10-CM | POA: Diagnosis not present

## 2016-04-19 DIAGNOSIS — I25810 Atherosclerosis of coronary artery bypass graft(s) without angina pectoris: Secondary | ICD-10-CM | POA: Diagnosis not present

## 2016-04-19 DIAGNOSIS — N179 Acute kidney failure, unspecified: Secondary | ICD-10-CM | POA: Diagnosis not present

## 2016-04-19 DIAGNOSIS — N183 Chronic kidney disease, stage 3 (moderate): Secondary | ICD-10-CM | POA: Diagnosis not present

## 2016-04-19 DIAGNOSIS — I25119 Atherosclerotic heart disease of native coronary artery with unspecified angina pectoris: Secondary | ICD-10-CM | POA: Diagnosis not present

## 2016-04-19 DIAGNOSIS — K819 Cholecystitis, unspecified: Secondary | ICD-10-CM | POA: Diagnosis not present

## 2016-04-19 HISTORY — DX: Malignant (primary) neoplasm, unspecified: C80.1

## 2016-04-19 HISTORY — DX: Atherosclerotic heart disease of native coronary artery without angina pectoris: I25.10

## 2016-04-19 HISTORY — DX: Type 2 diabetes mellitus without complications: E11.9

## 2016-04-19 LAB — URINALYSIS COMPLETE WITH MICROSCOPIC (ARMC ONLY)
BACTERIA UA: NONE SEEN
BILIRUBIN URINE: NEGATIVE
Glucose, UA: >500 mg/dL — AB
Ketones, ur: NEGATIVE mg/dL
LEUKOCYTES UA: NEGATIVE
NITRITE: NEGATIVE
PH: 6 (ref 5.0–8.0)
Specific Gravity, Urine: 1.012 (ref 1.005–1.030)

## 2016-04-19 LAB — COMPREHENSIVE METABOLIC PANEL
ALBUMIN: 3.8 g/dL (ref 3.5–5.0)
ALT: 26 U/L (ref 17–63)
AST: 25 U/L (ref 15–41)
Alkaline Phosphatase: 119 U/L (ref 38–126)
Anion gap: 9 (ref 5–15)
BUN: 34 mg/dL — ABNORMAL HIGH (ref 6–20)
CO2: 20 mmol/L — AB (ref 22–32)
Calcium: 9 mg/dL (ref 8.9–10.3)
Chloride: 107 mmol/L (ref 101–111)
Creatinine, Ser: 2.49 mg/dL — ABNORMAL HIGH (ref 0.61–1.24)
GFR calc Af Amer: 27 mL/min — ABNORMAL LOW (ref >60)
GFR calc non Af Amer: 24 mL/min — ABNORMAL LOW (ref >60)
Glucose, Bld: 266 mg/dL — ABNORMAL HIGH (ref 65–99)
POTASSIUM: 5.1 mmol/L (ref 3.5–5.1)
SODIUM: 136 mmol/L (ref 135–145)
Total Bilirubin: 0.7 mg/dL (ref 0.3–1.2)
Total Protein: 7.8 g/dL (ref 6.5–8.1)

## 2016-04-19 LAB — CBC WITH DIFFERENTIAL/PLATELET
Basophils Absolute: 0.1 10*3/uL (ref 0–0.1)
Basophils Relative: 1 %
EOS ABS: 0 10*3/uL (ref 0–0.7)
Eosinophils Relative: 0 %
HCT: 47.9 % (ref 40.0–52.0)
Hemoglobin: 16.5 g/dL (ref 13.0–18.0)
Lymphocytes Relative: 18 %
Lymphs Abs: 1.7 10*3/uL (ref 1.0–3.6)
MCH: 31.7 pg (ref 26.0–34.0)
MCHC: 34.5 g/dL (ref 32.0–36.0)
MCV: 92 fL (ref 80.0–100.0)
Monocytes Absolute: 0.4 10*3/uL (ref 0.2–1.0)
Monocytes Relative: 4 %
Neutro Abs: 7.2 10*3/uL — ABNORMAL HIGH (ref 1.4–6.5)
Neutrophils Relative %: 77 %
Platelets: 117 10*3/uL — ABNORMAL LOW (ref 150–440)
RBC: 5.2 MIL/uL (ref 4.40–5.90)
RDW: 14.4 % (ref 11.5–14.5)
WBC: 9.4 10*3/uL (ref 3.8–10.6)

## 2016-04-19 LAB — TROPONIN I: Troponin I: <0.03 ng/mL (ref <0.03)

## 2016-04-19 LAB — LIPASE, BLOOD: Lipase: 29 U/L (ref 11–51)

## 2016-04-19 MED ORDER — MORPHINE SULFATE (PF) 4 MG/ML IV SOLN
4.0000 mg | Freq: Once | INTRAVENOUS | Status: AC
  Administered 2016-04-19: 4 mg via INTRAVENOUS
  Filled 2016-04-19: qty 1

## 2016-04-19 MED ORDER — GI COCKTAIL ~~LOC~~
30.0000 mL | Freq: Once | ORAL | Status: AC
  Administered 2016-04-19: 30 mL via ORAL
  Filled 2016-04-19: qty 30

## 2016-04-19 MED ORDER — SODIUM CHLORIDE 0.9 % IV BOLUS (SEPSIS)
500.0000 mL | Freq: Once | INTRAVENOUS | Status: AC
  Administered 2016-04-19: 500 mL via INTRAVENOUS

## 2016-04-19 MED ORDER — ONDANSETRON HCL 4 MG/2ML IJ SOLN
4.0000 mg | Freq: Once | INTRAMUSCULAR | Status: AC
  Administered 2016-04-19: 4 mg via INTRAVENOUS
  Filled 2016-04-19: qty 2

## 2016-04-19 NOTE — ED Notes (Signed)
NAD noted at time of D/C. Pt denies questions or concerns. Pt ambulatory to the lobby at this time.  Pt refused wheelchair to the lobby at this time.  

## 2016-04-19 NOTE — ED Provider Notes (Signed)
[facility] Emergency Department Provider Note   ____________________________________________  Time seen: Approximately 7:55 AM  I have reviewed the triage vital signs and the nursing notes.   HISTORY  Chief Complaint Chest Pain    HPI [name] is a 75 y.o. male with hypertension, diabetes, stage IV chronic kidney disease. hyperlipidemia, thyroid disease, coronary artery disease status post CABG, history of treated colon cancer who presents for evaluation of approximately 7 hours epigastric abdominal pressure, gradual onset, constant, no modifying factors, currently mild to moderate. Patient reports he woke up from sleep at 1 AM with pressure in the epigastrium, left upper quadrant and the right upper quadrant. He had 3 episodes of nonbloody nonbilious emesis and 3 episodes of diarrhea. It is not worse with exertion. Not associated with any shortness of breath. He denies any chest pain. The pressure does radiate towards his shoulder blades. He reports this does not feel like a heart attack. He denies fevers or chills, no pain or burning with urination.   Past Medical History  Diagnosis Date  . Hypertension   . Hyperlipidemia   . Thyroid disease   . Diabetes mellitus without complication ([place])   . Cancer ([place])   . Coronary artery disease     Patient Active Problem List   Diagnosis Date Noted  . 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 CKD stage 4 due to type 2 diabetes mellitus ([place]) 03/16/2015  . Diabetes mellitus with polyneuropathy ([place]) 03/16/2015  . CAD in native artery 01/20/2015  . Gout 01/20/2015  . Diabetic peripheral neuropathy ([name]) 01/20/2015  . H/O malignant neoplasm of colon 01/20/2015  . Adult hypothyroidism 01/20/2015  . Hyperlipidemia associated with type 2 diabetes mellitus ([place]) 01/20/2015  .  Encounter for long-term (current) use of insulin ([place]) 07/27/2014  . High potassium 05/19/2014  . 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 Benign essential HTN 04/14/2014  . Hypertension in stage 4 chronic kidney disease due to type 2 diabetes mellitus ([place]) 07/15/2013    Past Surgical History  Procedure Laterality Date  . Prostate surgery  2002    BPH benign pathology  . Coronary artery bypass graft  1997    x 3  . Spine surgery  1989    Lumbar disc  . 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 Appendectomy  2011  . Hernia repair  2011    Ventral hernia    Current Outpatient Rx  Name  Route  Sig  Dispense  Refill  . aspirin EC 325 MG tablet   Oral   Take 325 mg by mouth daily.         Marland Kitchen atenolol (TENORMIN) 25 MG tablet      TAKE ONE TABLET BY MOUTH ONCE DAILY   30 tablet   5   . insulin NPH Human (HUMULIN N,NOVOLIN N) 100 UNIT/ML injection   Subcutaneous   Inject 30 Units into the skin 2 (two) times daily.         . insulin regular (NOVOLIN R,HUMULIN R) 100 units/mL injection   Subcutaneous   Inject 15-16 Units into the skin 2 (two) times daily. Pt takes depending on blood sugar.         . levothyroxine (SYNTHROID, LEVOTHROID) 300 MCG tablet   Oral   Take 300  mcg by mouth daily before breakfast.         . ranitidine (ZANTAC) 150 MG tablet   Oral   Take 150 mg by mouth 2 (two) times daily as needed for heartburn.         . simvastatin (ZOCOR) 20 MG tablet      TAKE ONE TABLET BY MOUTH AT BEDTIME   30 tablet   5   . Blood Glucose Monitoring Suppl ([place]) DEVI               . glucose blood (RELION PRIME TEST) test strip                 Allergies Ace inhibitors  Family History  Problem Relation Age of Onset  . Cancer Mother     Lung  . Diabetes Mother   . Heart disease Mother     Social History Social History  Substance Use Topics  . Smoking status:  Former Smoker    Quit date: 06/24/1996  . Smokeless tobacco: None  . Alcohol Use: No    Review of Systems Constitutional: No fever/chills Eyes: No visual changes. ENT: No sore throat. Cardiovascular: Denies chest pain. Respiratory: Denies shortness of breath. Gastrointestinal: + abdominal pain.  + nausea, + vomiting.  +diarrhea.  No constipation. Genitourinary: Negative for dysuria. Musculoskeletal: Negative for back pain. Skin: Negative for rash. Neurological: Negative for headaches, focal weakness or numbness.  10-point ROS otherwise negative.  ____________________________________________   PHYSICAL EXAM:  Filed Vitals:   04/19/16 1000 04/19/16 1212 04/19/16 1317 04/19/16 1409  BP: 164/79 148/98 154/93 133/99  Pulse: 37 64 60 63  Temp:      TempSrc:      Resp: 26 18 20 18   Height:      Weight:      SpO2: 93% 96% 92% 97%      Constitutional: Alert and oriented. Well appearing and in no acute distress. Eyes: Conjunctivae are normal. PERRL. EOMI. Head: Atraumatic. Nose: No congestion/rhinnorhea. Mouth/Throat: Mucous membranes are moist.  Oropharynx non-erythematous. Neck: No stridor.Supple without meningismus.  Cardiovascular: Normal rate, regular rhythm. Grossly normal heart sounds.  Good peripheral circulation. Respiratory: Normal respiratory effort.  No retractions. Lungs CTAB. Gastrointestinal: Soft with moderate tenderness in the epigastrium and right upper quadrant, faint tenderness in the left upper quadrant. No rebound or guarding. No CVA tenderness. Genitourinary: deferred Musculoskeletal: No lower extremity tenderness nor edema.  No joint effusions. Neurologic:  Normal speech and language. No gross focal neurologic deficits are appreciated. No gait instability. Skin:  Skin is warm, dry and intact. No rash noted. Psychiatric: Mood and affect are normal. Speech and behavior are normal.  ____________________________________________   LABS (all labs  ordered are listed, but only abnormal results are displayed)  Labs Reviewed  CBC WITH DIFFERENTIAL/PLATELET - Abnormal; Notable for the following:    Platelets 117 (*)    Neutro Abs 7.2 (*)    All other components within normal limits  COMPREHENSIVE METABOLIC PANEL - Abnormal; Notable for the following:    CO2 20 (*)    Glucose, Bld 266 (*)    BUN 34 (*)    Creatinine, Ser 2.49 (*)    GFR calc non Af Amer 24 (*)    GFR calc Af Amer 27 (*)    All other components within normal limits  URINALYSIS COMPLETEWITH MICROSCOPIC ([facility] ONLY) - Abnormal; Notable for the following:    Color, Urine STRAW (*)    APPearance CLEAR (*)  Glucose, UA >500 (*)    Hgb urine dipstick 1+ (*)    Protein, ur >500 (*)    Squamous Epithelial / LPF 0-5 (*)    All other components within normal limits  TROPONIN I  LIPASE, BLOOD  TROPONIN I   ____________________________________________  EKG  ED ECG REPORT I, Joanne Gavel, the attending physician, personally viewed and interpreted this ECG.   Date: 04/19/2016  EKG Time: 07:44  Rate: 60  Rhythm: normal sinus rhythm with PACs.  Axis: normal  Intervals:none  ST&T Change: No acute ST elevation or acute ST depression. Peaked T waves in V2 and V3.  ____________________________________________  RADIOLOGY  CXR IMPRESSION: No acute cardiopulmonary abnormality. Stable interstitial prominence consistent with the patient's smoking history. Post CABG changes. Aortic atherosclerosis.  RUQ ultrasound IMPRESSION: Negative exam.  CT abdomen and pelvis IMPRESSION: 1. There is no evidence of nephrolithiasis. No hydronephrosis or hydroureter. Mild lobulated renal contour bilaterally. Mild cortical thinning probable due to atrophy. Nonspecific mild perinephric stranding. 2. Mild distended urinary bladder without bladder filling defects. Stable urinary bladder diverticula. 3. Status post prostatectomy. 4. Status post right colectomy. 5. No pelvic  adenopathy. 6. Osteopenia and mild degenerative changes thoracolumbar spine.  MRA chest IMPRESSION: Limited exam as described. There is no obvious evidence of aortic dissection or aneurysm. ____________________________________________   PROCEDURES  Procedure(s) performed: None  Procedures  Critical Care performed: No  ____________________________________________   INITIAL IMPRESSION / ASSESSMENT AND PLAN / ED COURSE  Pertinent labs & imaging results that were available during my care of the patient were reviewed by me and considered in my medical decision making (see chart for details).  [name] is a 75 y.o. male with hypertension, hyperlipidemia, thyroid disease, coronary artery disease status post CABG, history of treated colon cancer who presents for evaluation of approximately 7 hours epigastric abdominal pressure as well as nausea vomiting and diarrhea. On exam, he is well-appearing and in no acute distress. His vital signs are stable, he is afebrile. He does have significant tenderness to palpation in the right upper quadrant and the epigastrium but the remainder of his exam is benign. EKG is not consistent with acute ischemia. CMP shows chronic stable creatinine elevation. CBC unremarkable, negative troponin. We'll obtain right upper quadrant ultrasound to evaluate for any acute gallbladder pathology. Ultrasound is unremarkable, we'll proceed with advanced CT imaging. We'll obtain a second troponin though I think ACS is less likely given no exertional complaints, no shortness of breath and he reports that this does not feel like his prior heart attack. We'll check urinalysis, treat him symptomatically. Reassess for disposition.  ----------------------------------------- 2:20 PM on 04/19/2016 ----------------------------------------- Patient has had complete resolution of his symptoms since arrival to the emergency department. I reviewed his labs. 2 sets of troponins are  negative. Urinalysis is not consistent with infection. RUQ ultrasound negative. CT of the abdomen and pelvis shows no acute cause of his epigastric abdominal pain. Because of his chronic kidney disease, we did not obtain a contrasted CT scan, we did obtain an MRA of his chest to evaluate the aorta given pain radiating to the back and there is no dissection or aneurysm though the exam is limited due to motion artifact. I discussed with him today that his workup is reassuring but the exact cause of his pain is not clear. We discussed that if this return precautions, need for close PCP follow-up and he is comfortable with the discharge plan. DC home. ____________________________________________   FINAL CLINICAL  IMPRESSION(S) / ED DIAGNOSES  Final diagnoses:  Pain  Acute epigastric pain      NEW MEDICATIONS STARTED DURING THIS VISIT:  New Prescriptions   No medications on file     Note:  This document was prepared using Dragon voice recognition software and may include unintentional dictation errors.    Joanne Gavel, MD 04/19/16 [phone]

## 2016-04-19 NOTE — ED Notes (Signed)
NAD noted at this time. Pt sitting up in bed. Will continue to monitor for further patient needs. Explained to patient who states that the doctor told him he was going home that we were waiting for D/C papers to be posted and that this RN would be back to D/C when D/C instructions were received. Pt states understanding at this time.

## 2016-04-19 NOTE — ED Notes (Signed)
NAD noted at this time. Pt resting in bed, returned from CT and MRA of his chest. Pt denies any concerns or needs at this time. Pt's family at bedside. GI cocktail given. Will continue to monitor for further patient needs at this time.

## 2016-04-19 NOTE — ED Notes (Signed)
PT states he started having chest heaviness in bilateral chest, just below the breasts, that radiates to bilateral shoulder blades; started at 1:00am and woke him from sleep.  Pt states nothing has worked to improve pain.  Endorses n/v with pain.  Pt has history of triple bypass surgery in 1997 with no cardiac problems since.

## 2016-04-20 ENCOUNTER — Inpatient Hospital Stay
Admission: EM | Admit: 2016-04-20 | Discharge: 2016-04-26 | DRG: 418 | Disposition: A | Discharge status: Home Health | Payer: PPO | Attending: Surgery | Admitting: Surgery

## 2016-04-20 ENCOUNTER — Encounter: Payer: Self-pay | Admitting: Emergency Medicine

## 2016-04-20 ENCOUNTER — Emergency Department: Payer: PPO

## 2016-04-20 DIAGNOSIS — R079 Chest pain, unspecified: Secondary | ICD-10-CM

## 2016-04-20 DIAGNOSIS — Z951 Presence of aortocoronary bypass graft: Secondary | ICD-10-CM

## 2016-04-20 DIAGNOSIS — Z833 Family history of diabetes mellitus: Secondary | ICD-10-CM

## 2016-04-20 DIAGNOSIS — I2510 Atherosclerotic heart disease of native coronary artery without angina pectoris: Secondary | ICD-10-CM | POA: Diagnosis present

## 2016-04-20 DIAGNOSIS — Z7982 Long term (current) use of aspirin: Secondary | ICD-10-CM

## 2016-04-20 DIAGNOSIS — I129 Hypertensive chronic kidney disease with stage 1 through stage 4 chronic kidney disease, or unspecified chronic kidney disease: Secondary | ICD-10-CM | POA: Diagnosis present

## 2016-04-20 DIAGNOSIS — E785 Hyperlipidemia, unspecified: Secondary | ICD-10-CM | POA: Diagnosis present

## 2016-04-20 DIAGNOSIS — Z87891 Personal history of nicotine dependence: Secondary | ICD-10-CM

## 2016-04-20 DIAGNOSIS — K819 Cholecystitis, unspecified: Secondary | ICD-10-CM

## 2016-04-20 DIAGNOSIS — K810 Acute cholecystitis: Secondary | ICD-10-CM | POA: Diagnosis present

## 2016-04-20 DIAGNOSIS — R1310 Dysphagia, unspecified: Secondary | ICD-10-CM

## 2016-04-20 DIAGNOSIS — E1122 Type 2 diabetes mellitus with diabetic chronic kidney disease: Secondary | ICD-10-CM | POA: Diagnosis present

## 2016-04-20 DIAGNOSIS — E039 Hypothyroidism, unspecified: Secondary | ICD-10-CM | POA: Diagnosis present

## 2016-04-20 DIAGNOSIS — Z85038 Personal history of other malignant neoplasm of large intestine: Secondary | ICD-10-CM

## 2016-04-20 DIAGNOSIS — Z8249 Family history of ischemic heart disease and other diseases of the circulatory system: Secondary | ICD-10-CM

## 2016-04-20 DIAGNOSIS — Z794 Long term (current) use of insulin: Secondary | ICD-10-CM

## 2016-04-20 DIAGNOSIS — I252 Old myocardial infarction: Secondary | ICD-10-CM

## 2016-04-20 DIAGNOSIS — N179 Acute kidney failure, unspecified: Secondary | ICD-10-CM

## 2016-04-20 DIAGNOSIS — Z79899 Other long term (current) drug therapy: Secondary | ICD-10-CM

## 2016-04-20 DIAGNOSIS — E875 Hyperkalemia: Secondary | ICD-10-CM

## 2016-04-20 DIAGNOSIS — N183 Chronic kidney disease, stage 3 (moderate): Secondary | ICD-10-CM | POA: Diagnosis present

## 2016-04-20 DIAGNOSIS — Z801 Family history of malignant neoplasm of trachea, bronchus and lung: Secondary | ICD-10-CM

## 2016-04-20 DIAGNOSIS — R1013 Epigastric pain: Secondary | ICD-10-CM

## 2016-04-20 DIAGNOSIS — N189 Chronic kidney disease, unspecified: Secondary | ICD-10-CM

## 2016-04-20 DIAGNOSIS — E860 Dehydration: Secondary | ICD-10-CM | POA: Diagnosis present

## 2016-04-20 LAB — CBC WITH DIFFERENTIAL/PLATELET
BASOS ABS: 0.1 10*3/uL (ref 0–0.1)
BASOS PCT: 1 %
Eosinophils Absolute: 0 10*3/uL (ref 0–0.7)
Eosinophils Relative: 0 %
HEMATOCRIT: 48.1 % (ref 40.0–52.0)
Hemoglobin: 16.1 g/dL (ref 13.0–18.0)
LYMPHS PCT: 13 %
Lymphs Abs: 2.2 10*3/uL (ref 1.0–3.6)
MCH: 30.9 pg (ref 26.0–34.0)
MCHC: 33.4 g/dL (ref 32.0–36.0)
MCV: 92.6 fL (ref 80.0–100.0)
Monocytes Absolute: 1.4 10*3/uL — ABNORMAL HIGH (ref 0.2–1.0)
Monocytes Relative: 9 %
NEUTROS ABS: 12.6 10*3/uL — AB (ref 1.4–6.5)
Neutrophils Relative %: 77 %
PLATELETS: 112 10*3/uL — AB (ref 150–440)
RBC: 5.2 MIL/uL (ref 4.40–5.90)
RDW: 14.5 % (ref 11.5–14.5)
WBC: 16.3 10*3/uL — AB (ref 3.8–10.6)

## 2016-04-20 LAB — TROPONIN I: Troponin I: <0.03 ng/mL (ref <0.03)

## 2016-04-20 LAB — COMPREHENSIVE METABOLIC PANEL
ALBUMIN: 3.3 g/dL — AB (ref 3.5–5.0)
ALT: 23 U/L (ref 17–63)
AST: 26 U/L (ref 15–41)
Alkaline Phosphatase: 113 U/L (ref 38–126)
Anion gap: 7 (ref 5–15)
BUN: 35 mg/dL — AB (ref 6–20)
CHLORIDE: 102 mmol/L (ref 101–111)
CO2: 21 mmol/L — ABNORMAL LOW (ref 22–32)
CREATININE: 2.72 mg/dL — AB (ref 0.61–1.24)
Calcium: 8.6 mg/dL — ABNORMAL LOW (ref 8.9–10.3)
GFR calc Af Amer: 25 mL/min — ABNORMAL LOW (ref >60)
GFR, EST NON AFRICAN AMERICAN: 21 mL/min — AB (ref >60)
GLUCOSE: 224 mg/dL — AB (ref 65–99)
POTASSIUM: 5.6 mmol/L — AB (ref 3.5–5.1)
Sodium: 130 mmol/L — ABNORMAL LOW (ref 135–145)
Total Bilirubin: 1.2 mg/dL (ref 0.3–1.2)
Total Protein: 7.1 g/dL (ref 6.5–8.1)

## 2016-04-20 LAB — LIPASE, BLOOD: LIPASE: 16 U/L (ref 11–51)

## 2016-04-20 MED ORDER — MORPHINE SULFATE (PF) 4 MG/ML IV SOLN
4.0000 mg | Freq: Once | INTRAVENOUS | Status: AC
Start: 2016-04-20 — End: 2016-04-20
  Administered 2016-04-20: 4 mg via INTRAVENOUS
  Filled 2016-04-20: qty 1

## 2016-04-20 MED ORDER — DIATRIZOATE MEGLUMINE & SODIUM 66-10 % PO SOLN
15.0000 mL | ORAL | Status: AC
  Administered 2016-04-20 – 2016-04-21 (×2): 15 mL via ORAL

## 2016-04-20 MED ORDER — SODIUM CHLORIDE 0.9 % IV SOLN
1.0000 g | Freq: Once | INTRAVENOUS | Status: AC
  Administered 2016-04-21: 1 g via INTRAVENOUS
  Filled 2016-04-20: qty 10

## 2016-04-20 MED ORDER — ONDANSETRON HCL 4 MG/2ML IJ SOLN
4.0000 mg | Freq: Once | INTRAMUSCULAR | Status: AC
  Administered 2016-04-20: 4 mg via INTRAVENOUS
  Filled 2016-04-20: qty 2

## 2016-04-20 MED ORDER — SODIUM CHLORIDE 0.9 % IV BOLUS (SEPSIS)
1000.0000 mL | Freq: Once | INTRAVENOUS | Status: AC
  Administered 2016-04-20: 1000 mL via INTRAVENOUS

## 2016-04-20 NOTE — ED Notes (Signed)
States was seen here yesterday for epigastric pain, did well during night but this am pain returned and has been increasing throughout day.

## 2016-04-20 NOTE — ED Notes (Signed)
Patient reports having returned abdominal pain since this morning.  Patient states pain across both upper quads especially right upper and at bottom of lower quads.  Patient denies nausea or vomiting today (last time was on Friday).  Patient reports very little po intake due to pain.

## 2016-04-20 NOTE — ED Notes (Signed)
Patient resting quietly at this time, appears more comfortable.

## 2016-04-20 NOTE — ED Provider Notes (Signed)
[facility] Emergency Department Provider Note    ____________________________________________  Time seen: Approximately 10:30 PM  I have reviewed the triage vital signs and the nursing notes.   HISTORY  Chief Complaint Abdominal Pain   HPI [name] is a 75 y.o. male with a history of hypertension and diabetes as well as chronic kidney disease was present in the emergency department with epigastric abdominal pain. He was here yesterday for similar complaint. He says that after GI cocktail as well as morphine he had no symptoms. However, after being discharged home his symptoms returned this morning. He says that they acutely worsen at about 3 PM. He says that his pain radiates from his epigastrium to his lower abdomen but also to his bilateral shoulders. He says that it feels like he needs to cough. Denies any chest pain or shortness of breath. Says that he has not had any further episodes of nausea vomiting or diarrhea. No history of GERD or stomach ulcers. He does have a history of a neoplasm of the colon which he said was removed.    Past Medical History  Diagnosis Date  . Hypertension   . Hyperlipidemia   . Thyroid disease   . Diabetes mellitus without complication ([place])   . Cancer ([place])   . Coronary artery disease     Patient Active Problem List   Diagnosis Date Noted  . 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 CKD stage 4 due to type 2 diabetes mellitus ([place]) 03/16/2015  . Diabetes mellitus with polyneuropathy ([place]) 03/16/2015  . CAD in native artery 01/20/2015  . Gout 01/20/2015  . Diabetic peripheral neuropathy ([place]) 01/20/2015  . H/O malignant neoplasm of colon 01/20/2015  . Adult hypothyroidism 01/20/2015  . Hyperlipidemia associated with type 2 diabetes mellitus ([place]) 01/20/2015  . Encounter for  long-term (current) use of insulin ([place]) 07/27/2014  . High potassium 05/19/2014  . 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 Benign essential HTN 04/14/2014  . Hypertension in stage 4 chronic kidney disease due to type 2 diabetes mellitus ([place]) 07/15/2013    Past Surgical History  Procedure Laterality Date  . Prostate surgery  2002    BPH benign pathology  . Coronary artery bypass graft  1997    x 3  . Spine surgery  1989    Lumbar disc  . 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 Appendectomy  2011  . Hernia repair  2011    Ventral hernia  . Knee surgery Left     Current Outpatient Rx  Name  Route  Sig  Dispense  Refill  . aspirin EC 325 MG tablet   Oral   Take 325 mg by mouth daily.         Marland Kitchen atenolol (TENORMIN) 25 MG tablet      TAKE ONE TABLET BY MOUTH ONCE DAILY   30 tablet   5   . insulin NPH Human (HUMULIN N,NOVOLIN N) 100 UNIT/ML injection   Subcutaneous   Inject 30 Units into the skin 2 (two) times daily.         . insulin regular (NOVOLIN R,HUMULIN R) 100 units/mL injection   Subcutaneous   Inject 15-16 Units into the skin 2 (two) times daily. Pt takes depending on blood sugar.         Marland Kitchen  levothyroxine (SYNTHROID, LEVOTHROID) 300 MCG tablet   Oral   Take 300 mcg by mouth daily before breakfast.         . ranitidine (ZANTAC) 150 MG tablet   Oral   Take 150 mg by mouth 2 (two) times daily as needed for heartburn.         . simvastatin (ZOCOR) 20 MG tablet      TAKE ONE TABLET BY MOUTH AT BEDTIME   30 tablet   5   . Blood Glucose Monitoring Suppl ([place]) DEVI               . glucose blood (RELION PRIME TEST) test strip                 Allergies Ace inhibitors  Family History  Problem Relation Age of Onset  . Cancer Mother     Lung  . Diabetes Mother   . Heart disease Mother     Social History Social History  Substance Use Topics  . Smoking  status: Former Smoker    Quit date: 06/24/1996  . Smokeless tobacco: None  . Alcohol Use: No    Review of Systems Constitutional: No fever/chills Eyes: No visual changes. ENT: No sore throat. Cardiovascular: Denies chest pain. Respiratory: Denies shortness of breath. Gastrointestinal: No nausea, no vomiting.  No diarrhea.  No constipation. Genitourinary: Negative for dysuria. Musculoskeletal: Negative for back pain. Skin: Negative for rash. Neurological: Negative for headaches, focal weakness or numbness.  10-point ROS otherwise negative.  ____________________________________________   PHYSICAL EXAM:  VITAL SIGNS: ED Triage Vitals  Enc Vitals Group     BP 04/20/16 1824 161/93 mmHg     Pulse Rate 04/20/16 1824 73     Resp 04/20/16 1824 18     Temp 04/20/16 1824 97.8 F (36.6 C)     Temp Source 04/20/16 1824 Oral     SpO2 04/20/16 1824 94 %     Weight 04/20/16 1824 250 lb (113.399 kg)     Height 04/20/16 1824 6\' 2"  (1.88 m)     Head Cir --      Peak Flow --      Pain Score 04/20/16 1828 9     Pain Loc --      Pain Edu? --      Excl. in [place]? --     Constitutional: Alert and oriented. Well appearing and in no acute distress. Eyes: Conjunctivae are normal. PERRL. EOMI. Head: Atraumatic. Nose: No congestion/rhinnorhea. Mouth/Throat: Mucous membranes are moist.   Neck: No stridor.   Cardiovascular: Normal rate, regular rhythm. Grossly normal heart sounds.   Respiratory: Normal respiratory effort.  No retractions. Lungs CTAB. Gastrointestinal: Soft With tenderness in the epigastrium as well as right upper quadrant. There is guarding as well on palpation to the epigastrium. Appears distended but the patient says this is his baseline. No CVA tenderness. Musculoskeletal: No lower extremity tenderness nor edema.  No joint effusions. Neurologic:  Normal speech and language. No gross focal neurologic deficits are appreciated. Skin:  Skin is warm, dry and intact. No rash  noted. Psychiatric: Mood and affect are normal. Speech and behavior are normal.  ____________________________________________   LABS (all labs ordered are listed, but only abnormal results are displayed)  Labs Reviewed  CBC WITH DIFFERENTIAL/PLATELET - Abnormal; Notable for the following:    WBC 16.3 (*)    Platelets 112 (*)    Neutro Abs 12.6 (*)    Monocytes Absolute 1.4 (*)  All other components within normal limits  COMPREHENSIVE METABOLIC PANEL - Abnormal; Notable for the following:    Sodium 130 (*)    Potassium 5.6 (*)    CO2 21 (*)    Glucose, Bld 224 (*)    BUN 35 (*)    Creatinine, Ser 2.72 (*)    Calcium 8.6 (*)    Albumin 3.3 (*)    GFR calc non Af Amer 21 (*)    GFR calc Af Amer 25 (*)    All other components within normal limits  LIPASE, BLOOD  TROPONIN I   ____________________________________________  EKG  ED ECG REPORT I, Doran Stabler, the attending physician, personally viewed and interpreted this ECG.   Date: 04/20/2016  EKG Time: 2256  Rate: 73  Rhythm: normal sinus rhythm with a PVC times one.  Axis: Right axis deviation  Intervals:none  ST&T Change: T-wave peaking appears unchanged from yesterday's EKG. T-wave inversions in 23, aVF as well as V5 and V6 which is been seen on previous EKGs on the medical record.  ____________________________________________  RADIOLOGY  DG Chest 1 View (Final result) Result time: 04/20/16 23:00:49   Final result by Rad Results In Interface (04/20/16 23:00:49)   Narrative:   CLINICAL DATA: 75 year old male with abdominal pain since this morning.  EXAM: CHEST 1 VIEW  COMPARISON: Chest x-ray 04/19/2016.  FINDINGS: Chronic elevation of the right hemidiaphragm is unchanged. Low lung volumes. No consolidative airspace disease. No pleural effusions. No pneumothorax. No evidence of pulmonary edema. Heart size is normal. Upper mediastinal contours are within normal limits. Atherosclerosis in the  thoracic aorta. Status post median sternotomy for CABG.  IMPRESSION: 1. Low lung volumes without radiographic evidence of acute cardiopulmonary disease. 2. Aortic atherosclerosis. 3. Chronic elevation of the right hemidiaphragm is unchanged.   Electronically Signed By: Vinnie Langton M.D. On: 04/20/2016 23:00    ____________________________________________   PROCEDURES   Procedures  ____________________________________________   INITIAL IMPRESSION / ASSESSMENT AND PLAN / ED COURSE  Pertinent labs & imaging results that were available during my care of the patient were reviewed by me and considered in my medical decision making (see chart for details).  ----------------------------------------- 11:16 PM on 04/20/2016 -----------------------------------------  Patient's labs are worsened since yesterday with elevated creatinine as well as an increase in the potassium. Also white blood cell count is elevated. I do not see widening of the QRS or worsening T-wave peaking since previous. We'll give IV fluids as well as calcium. Patient aware that he will be having a CAT scan. I'm concerned that he may have had a perforation of a gastric ulcer or worsening of gastric ulcer disease. Signed out to Dr. Dahlia Client. ____________________________________________   FINAL CLINICAL IMPRESSION(S) / ED DIAGNOSES  Hyperkalemia. Acute on chronic renal failure. Epigastric abdominal pain.    NEW MEDICATIONS STARTED DURING THIS VISIT:  New Prescriptions   No medications on file     Note:  This document was prepared using Dragon voice recognition software and may include unintentional dictation errors.    Orbie Pyo, MD 04/20/16 [phone]

## 2016-04-21 ENCOUNTER — Encounter: Payer: Self-pay | Admitting: Internal Medicine

## 2016-04-21 ENCOUNTER — Inpatient Hospital Stay: Payer: PPO

## 2016-04-21 ENCOUNTER — Emergency Department: Payer: PPO

## 2016-04-21 DIAGNOSIS — K810 Acute cholecystitis: Secondary | ICD-10-CM | POA: Diagnosis not present

## 2016-04-21 DIAGNOSIS — E119 Type 2 diabetes mellitus without complications: Secondary | ICD-10-CM | POA: Diagnosis not present

## 2016-04-21 DIAGNOSIS — I10 Essential (primary) hypertension: Secondary | ICD-10-CM | POA: Diagnosis not present

## 2016-04-21 DIAGNOSIS — E860 Dehydration: Secondary | ICD-10-CM | POA: Diagnosis not present

## 2016-04-21 DIAGNOSIS — Z801 Family history of malignant neoplasm of trachea, bronchus and lung: Secondary | ICD-10-CM | POA: Diagnosis not present

## 2016-04-21 DIAGNOSIS — K819 Cholecystitis, unspecified: Secondary | ICD-10-CM | POA: Diagnosis not present

## 2016-04-21 DIAGNOSIS — I25119 Atherosclerotic heart disease of native coronary artery with unspecified angina pectoris: Secondary | ICD-10-CM | POA: Diagnosis not present

## 2016-04-21 DIAGNOSIS — E785 Hyperlipidemia, unspecified: Secondary | ICD-10-CM | POA: Diagnosis not present

## 2016-04-21 DIAGNOSIS — N179 Acute kidney failure, unspecified: Secondary | ICD-10-CM | POA: Diagnosis not present

## 2016-04-21 DIAGNOSIS — Z951 Presence of aortocoronary bypass graft: Secondary | ICD-10-CM | POA: Diagnosis not present

## 2016-04-21 DIAGNOSIS — I25810 Atherosclerosis of coronary artery bypass graft(s) without angina pectoris: Secondary | ICD-10-CM | POA: Diagnosis not present

## 2016-04-21 DIAGNOSIS — I129 Hypertensive chronic kidney disease with stage 1 through stage 4 chronic kidney disease, or unspecified chronic kidney disease: Secondary | ICD-10-CM | POA: Diagnosis not present

## 2016-04-21 DIAGNOSIS — E875 Hyperkalemia: Secondary | ICD-10-CM | POA: Diagnosis not present

## 2016-04-21 DIAGNOSIS — R1013 Epigastric pain: Secondary | ICD-10-CM | POA: Diagnosis not present

## 2016-04-21 DIAGNOSIS — I25719 Atherosclerosis of autologous vein coronary artery bypass graft(s) with unspecified angina pectoris: Secondary | ICD-10-CM | POA: Diagnosis not present

## 2016-04-21 DIAGNOSIS — Z85038 Personal history of other malignant neoplasm of large intestine: Secondary | ICD-10-CM | POA: Diagnosis not present

## 2016-04-21 DIAGNOSIS — R109 Unspecified abdominal pain: Secondary | ICD-10-CM | POA: Diagnosis not present

## 2016-04-21 DIAGNOSIS — Z87891 Personal history of nicotine dependence: Secondary | ICD-10-CM | POA: Diagnosis not present

## 2016-04-21 DIAGNOSIS — R112 Nausea with vomiting, unspecified: Secondary | ICD-10-CM | POA: Diagnosis not present

## 2016-04-21 DIAGNOSIS — N189 Chronic kidney disease, unspecified: Secondary | ICD-10-CM | POA: Diagnosis not present

## 2016-04-21 DIAGNOSIS — E039 Hypothyroidism, unspecified: Secondary | ICD-10-CM | POA: Diagnosis not present

## 2016-04-21 DIAGNOSIS — Z7982 Long term (current) use of aspirin: Secondary | ICD-10-CM | POA: Diagnosis not present

## 2016-04-21 DIAGNOSIS — I2510 Atherosclerotic heart disease of native coronary artery without angina pectoris: Secondary | ICD-10-CM | POA: Diagnosis not present

## 2016-04-21 DIAGNOSIS — R079 Chest pain, unspecified: Secondary | ICD-10-CM | POA: Diagnosis not present

## 2016-04-21 DIAGNOSIS — I252 Old myocardial infarction: Secondary | ICD-10-CM | POA: Diagnosis not present

## 2016-04-21 DIAGNOSIS — R197 Diarrhea, unspecified: Secondary | ICD-10-CM | POA: Diagnosis not present

## 2016-04-21 DIAGNOSIS — Z79899 Other long term (current) drug therapy: Secondary | ICD-10-CM | POA: Diagnosis not present

## 2016-04-21 DIAGNOSIS — Z794 Long term (current) use of insulin: Secondary | ICD-10-CM | POA: Diagnosis not present

## 2016-04-21 DIAGNOSIS — Z8249 Family history of ischemic heart disease and other diseases of the circulatory system: Secondary | ICD-10-CM | POA: Diagnosis not present

## 2016-04-21 DIAGNOSIS — N183 Chronic kidney disease, stage 3 (moderate): Secondary | ICD-10-CM | POA: Diagnosis not present

## 2016-04-21 DIAGNOSIS — E1122 Type 2 diabetes mellitus with diabetic chronic kidney disease: Secondary | ICD-10-CM | POA: Diagnosis not present

## 2016-04-21 DIAGNOSIS — Z833 Family history of diabetes mellitus: Secondary | ICD-10-CM | POA: Diagnosis not present

## 2016-04-21 LAB — COMPREHENSIVE METABOLIC PANEL
ALBUMIN: 2.8 g/dL — AB (ref 3.5–5.0)
ALT: 26 U/L (ref 17–63)
ANION GAP: 7 (ref 5–15)
AST: 36 U/L (ref 15–41)
Alkaline Phosphatase: 100 U/L (ref 38–126)
BUN: 34 mg/dL — ABNORMAL HIGH (ref 6–20)
CO2: 20 mmol/L — AB (ref 22–32)
Calcium: 8.1 mg/dL — ABNORMAL LOW (ref 8.9–10.3)
Chloride: 102 mmol/L (ref 101–111)
Creatinine, Ser: 2.53 mg/dL — ABNORMAL HIGH (ref 0.61–1.24)
GFR calc Af Amer: 27 mL/min — ABNORMAL LOW (ref >60)
GFR calc non Af Amer: 23 mL/min — ABNORMAL LOW (ref >60)
GLUCOSE: 336 mg/dL — AB (ref 65–99)
POTASSIUM: 5.6 mmol/L — AB (ref 3.5–5.1)
SODIUM: 129 mmol/L — AB (ref 135–145)
TOTAL PROTEIN: 6.5 g/dL (ref 6.5–8.1)
Total Bilirubin: 1.6 mg/dL — ABNORMAL HIGH (ref 0.3–1.2)

## 2016-04-21 LAB — GLUCOSE, CAPILLARY
GLUCOSE-CAPILLARY: 322 mg/dL — AB (ref 65–99)
GLUCOSE-CAPILLARY: 236 mg/dL — AB (ref 65–99)
GLUCOSE-CAPILLARY: 270 mg/dL — AB (ref 65–99)
Glucose-Capillary: 325 mg/dL — ABNORMAL HIGH (ref 65–99)
Glucose-Capillary: 308 mg/dL — ABNORMAL HIGH (ref 65–99)
Glucose-Capillary: 330 mg/dL — ABNORMAL HIGH (ref 65–99)

## 2016-04-21 LAB — CBC
HEMATOCRIT: 45.1 % (ref 40.0–52.0)
Hemoglobin: 14.9 g/dL (ref 13.0–18.0)
MCH: 30.9 pg (ref 26.0–34.0)
MCHC: 33.1 g/dL (ref 32.0–36.0)
MCV: 93.4 fL (ref 80.0–100.0)
Platelets: 103 10*3/uL — ABNORMAL LOW (ref 150–440)
RBC: 4.83 MIL/uL (ref 4.40–5.90)
RDW: 14.5 % (ref 11.5–14.5)
WBC: 18.5 10*3/uL — ABNORMAL HIGH (ref 3.8–10.6)

## 2016-04-21 MED ORDER — LACTATED RINGERS IV SOLN
INTRAVENOUS | Status: DC
  Administered 2016-04-21 (×2): via INTRAVENOUS

## 2016-04-21 MED ORDER — MORPHINE SULFATE (PF) 2 MG/ML IV SOLN
2.0000 mg | INTRAVENOUS | Status: DC | PRN
  Administered 2016-04-21: 2 mg via INTRAVENOUS
  Filled 2016-04-21: qty 1

## 2016-04-21 MED ORDER — PIPERACILLIN-TAZOBACTAM 3.375 G IVPB
3.3750 g | Freq: Three times a day (TID) | INTRAVENOUS | Status: DC
  Administered 2016-04-21 – 2016-04-25 (×12): 3.375 g via INTRAVENOUS
  Filled 2016-04-21 (×17): qty 50

## 2016-04-21 MED ORDER — ONDANSETRON HCL 4 MG/2ML IJ SOLN
4.0000 mg | Freq: Four times a day (QID) | INTRAMUSCULAR | Status: DC | PRN
  Administered 2016-04-23: 4 mg via INTRAVENOUS

## 2016-04-21 MED ORDER — SODIUM CHLORIDE 0.9 % IV SOLN
INTRAVENOUS | Status: DC
  Administered 2016-04-21 – 2016-04-23 (×5): via INTRAVENOUS

## 2016-04-21 MED ORDER — ONDANSETRON 8 MG PO TBDP: 4.0000 mg | ORAL_TABLET | Freq: Four times a day (QID) | ORAL | Status: DC | PRN

## 2016-04-21 MED ORDER — INSULIN GLARGINE 100 UNIT/ML ~~LOC~~ SOLN
10.0000 [IU] | Freq: Every day | SUBCUTANEOUS | Status: DC
  Administered 2016-04-21 – 2016-04-23 (×3): 10 [IU] via SUBCUTANEOUS
  Filled 2016-04-21 (×4): qty 0.1

## 2016-04-21 MED ORDER — INSULIN ASPART 100 UNIT/ML ~~LOC~~ SOLN
0.0000 [IU] | SUBCUTANEOUS | Status: DC
  Administered 2016-04-21 (×3): 11 [IU] via SUBCUTANEOUS
  Administered 2016-04-21: 8 [IU] via SUBCUTANEOUS
  Administered 2016-04-21 – 2016-04-22 (×3): 5 [IU] via SUBCUTANEOUS
  Administered 2016-04-22: 3 [IU] via SUBCUTANEOUS
  Administered 2016-04-22 (×2): 5 [IU] via SUBCUTANEOUS
  Administered 2016-04-23: 2 [IU] via SUBCUTANEOUS
  Administered 2016-04-23: 3 [IU] via SUBCUTANEOUS
  Administered 2016-04-23: 2 [IU] via SUBCUTANEOUS
  Administered 2016-04-23: 5 [IU] via SUBCUTANEOUS
  Administered 2016-04-23: 2 [IU] via SUBCUTANEOUS
  Administered 2016-04-23 – 2016-04-24 (×2): 3 [IU] via SUBCUTANEOUS
  Administered 2016-04-24: 5 [IU] via SUBCUTANEOUS
  Administered 2016-04-24 (×2): 3 [IU] via SUBCUTANEOUS
  Administered 2016-04-24: 5 [IU] via SUBCUTANEOUS
  Administered 2016-04-24: 3 [IU] via SUBCUTANEOUS
  Administered 2016-04-25: 8 [IU] via SUBCUTANEOUS
  Administered 2016-04-25: 5 [IU] via SUBCUTANEOUS
  Administered 2016-04-25: 3 [IU] via SUBCUTANEOUS
  Administered 2016-04-25: 2 [IU] via SUBCUTANEOUS
  Administered 2016-04-25: 11 [IU] via SUBCUTANEOUS
  Administered 2016-04-25: 5 [IU] via SUBCUTANEOUS
  Administered 2016-04-26: 11 [IU] via SUBCUTANEOUS
  Administered 2016-04-26: 5 [IU] via SUBCUTANEOUS
  Administered 2016-04-26: 3 [IU] via SUBCUTANEOUS
  Administered 2016-04-26 (×2): 5 [IU] via SUBCUTANEOUS
  Filled 2016-04-21: qty 3
  Filled 2016-04-21 (×2): qty 8
  Filled 2016-04-21 (×2): qty 5
  Filled 2016-04-21: qty 3
  Filled 2016-04-21: qty 2
  Filled 2016-04-21: qty 3
  Filled 2016-04-21: qty 2
  Filled 2016-04-21: qty 5
  Filled 2016-04-21 (×2): qty 3
  Filled 2016-04-21: qty 11
  Filled 2016-04-21: qty 5
  Filled 2016-04-21: qty 2
  Filled 2016-04-21: qty 5
  Filled 2016-04-21 (×3): qty 11
  Filled 2016-04-21 (×2): qty 3
  Filled 2016-04-21 (×3): qty 5
  Filled 2016-04-21: qty 11
  Filled 2016-04-21: qty 2
  Filled 2016-04-21 (×3): qty 5
  Filled 2016-04-21: qty 3
  Filled 2016-04-21 (×2): qty 5
  Filled 2016-04-21: qty 3

## 2016-04-21 MED ORDER — FAMOTIDINE IN NACL 20-0.9 MG/50ML-% IV SOLN
20.0000 mg | Freq: Every day | INTRAVENOUS | Status: DC
  Administered 2016-04-21 – 2016-04-24 (×5): 20 mg via INTRAVENOUS
  Filled 2016-04-21 (×6): qty 50

## 2016-04-21 NOTE — ED Notes (Signed)
Patient has gone for CT scan.

## 2016-04-21 NOTE — ED Notes (Signed)
Patient has finished oral contrast, CT tech notified.

## 2016-04-21 NOTE — Progress Notes (Signed)
History reviewed with Dr. Rosalia Hammers and with patient RN and chart. His history is consistent with acute cholecystitis. However he has multiple medical problems including a coronary artery bypass graft and known CAD 20 years ago his regular cardiologist is Dr. Ubaldo Glassing. He has chronic renal failure and has known about that but certainly his creatinine is over 2-1/2.  He is tender in the right upper quadrant with vital signs are normal. He is positive Murphy sign  Equivocal studies in the 48 hour period but this patient is likely to have acute cholecystitis I would like to confirm that with a HIDA scan but in all likelihood he will require surgery for this. I will ask both internal medicine and Dr. Satira Mccallum to see the patient for preoperative clearance in anticipation of the need for a laparoscopic cholecystectomy. Also of note however is the fact that he has had a right colon resection by Dr. Marina Gravel several years ago and this may necessitate conversion to an open procedure should considerable adhesions being encountered. Another reason for obtaining a HIDA scan to confirm the diagnosis.

## 2016-04-21 NOTE — Consult Note (Signed)
History and Physical    [name] [id number] DOB: January 28, 1941 DOA: 04/20/2016  Referring physician: Dr. Burt Knack PCP: Halina Maidens, MD  Specialists: Dr. Ubaldo Glassing  Chief Complaint: abdominal pain  HPI: [name] is a 75 y.o. male has a past medical history significant for stage III CKD, CAD, and DM now with acute abdominal pain due to gallbladder disease. Possible surgery tomorrow. Asked to see patient for pre-op medical evaluation. Pt has hx of MI in 1997. Last stress test 3-4 years ago was "normal". No CP or SOB. Also has CKD followed by Nephrology. Renal fxn appears stable. Has good urine output. BP is fine. Sugars slightly elevated.  Review of Systems: The patient denies anorexia, fever, weight loss,, vision loss, decreased hearing, hoarseness, chest pain, syncope, dyspnea on exertion, peripheral edema, balance deficits, hemoptysis,  melena, hematochezia, severe indigestion/heartburn, hematuria, incontinence, genital sores, muscle weakness, suspicious skin lesions, transient blindness, difficulty walking, depression, unusual weight change, abnormal bleeding, enlarged lymph nodes, angioedema, and breast masses.   Past Medical History  Diagnosis Date  . Hypertension   . Hyperlipidemia   . Thyroid disease   . Diabetes mellitus without complication ([place])   . Cancer ([place])   . Coronary artery disease    Past Surgical History  Procedure Laterality Date  . Prostate surgery  2002    BPH benign pathology  . Coronary artery bypass graft  1997    x 3  . Spine surgery  1989    Lumbar disc  . 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 Appendectomy  2011  . Hernia repair  2011    Ventral hernia  . Knee surgery Left    Social History:  reports that he quit smoking about 19 years ago. He does not have any smokeless tobacco history on file. He reports that he does not drink alcohol or use illicit drugs.  Allergies  Allergen Reactions  . Ace Inhibitors Other  (See Comments)    Reaction:  Raises potassium     Family History  Problem Relation Age of Onset  . Cancer Mother     Lung  . Diabetes Mother   . Heart disease Mother     Prior to Admission medications   Medication Sig Start Date End Date Taking? Authorizing Provider  aspirin EC 325 MG tablet Take 325 mg by mouth daily.   Yes Historical Provider, MD  atenolol (TENORMIN) 25 MG tablet TAKE ONE TABLET BY MOUTH ONCE DAILY 10/05/15  Yes Glean Hess, MD  Blood Glucose Monitoring Suppl ([facility]) [place]  01/29/14  Yes Historical Provider, MD  glucose blood (RELION PRIME TEST) test strip  02/20/15  Yes Historical Provider, MD  insulin NPH Human (HUMULIN N,NOVOLIN N) 100 UNIT/ML injection Inject 30 Units into the skin 2 (two) times daily.   Yes Historical Provider, MD  insulin regular (NOVOLIN R,HUMULIN R) 100 units/mL injection Inject 15-16 Units into the skin 2 (two) times daily. Pt takes depending on blood sugar.   Yes Historical Provider, MD  levothyroxine (SYNTHROID, LEVOTHROID) 300 MCG tablet Take 300 mcg by mouth daily before breakfast.   Yes Historical Provider, MD  ranitidine (ZANTAC) 150 MG tablet Take 150 mg by mouth 2 (two) times daily as needed for heartburn.   Yes Historical Provider, MD  simvastatin (ZOCOR) 20 MG tablet TAKE ONE TABLET BY MOUTH AT BEDTIME 07/17/15  Yes Glean Hess, MD   Physical Exam: Filed Vitals:  04/20/16 2330 04/21/16 0000 04/21/16 0301 04/21/16 1317  BP: 166/93 160/99 144/90 142/73  Pulse: 71 68 77 73  Temp:   98.6 F (37 C) 98.4 F (36.9 C)  TempSrc:   Oral Oral  Resp: 16 24 20 20   Height:   6\' 2"  (1.88 m)   Weight:   114.17 kg (251 lb 11.2 oz)   SpO2: 95% 93% 98% 99%     General:  No apparent distress, WDWN, [place]/AT  Eyes: PERRL, EOMI, no scleral icterus, conjunctiva clear  ENT: moist oropharynx without exudate, TM's benign, dentition fair  Neck: supple, no lymphadenopathy. No bruits or thyromegaly  Cardiovascular: regular  rate without MRG; 2+ peripheral pulses, no JVD, no peripheral edema  Respiratory: mildly decreased breath sounds at right base, o/w good air movement without wheezing, rhonchi or crackled. Respiratory effort normal  Abdomen: soft,  tender to palpation, positive bowel sounds, some guarding, no rebound  Skin: no rashes or lesions  Musculoskeletal: normal bulk and tone, no joint swelling  Psychiatric: normal mood and affect, A&OX3  Neurologic: CN 2-12 grossly intact, Motor strength 5/5 in all 4 groups with symmetric DTR's and non-focal sensory exam  Labs on Admission:  Basic Metabolic Panel:  Recent Labs Lab 04/19/16 0800 04/20/16 1833 04/21/16 0520  NA 136 130* 129*  K 5.1 5.6* 5.6*  CL 107 102 102  CO2 20* 21* 20*  GLUCOSE 266* 224* 336*  BUN 34* 35* 34*  CREATININE 2.49* 2.72* 2.53*  CALCIUM 9.0 8.6* 8.1*   Liver Function Tests:  Recent Labs Lab 04/19/16 0800 04/20/16 1833 04/21/16 0520  AST 25 26 36  ALT 26 23 26   ALKPHOS 119 113 100  BILITOT 0.7 1.2 1.6*  PROT 7.8 7.1 6.5  ALBUMIN 3.8 3.3* 2.8*    Recent Labs Lab 04/19/16 0800 04/20/16 1833  LIPASE 29 16   No results for input(s): AMMONIA in the last 168 hours. CBC:  Recent Labs Lab 04/19/16 0800 04/20/16 1833 04/21/16 0520  WBC 9.4 16.3* 18.5*  NEUTROABS 7.2* 12.6*  --   HGB 16.5 16.1 14.9  HCT 47.9 48.1 45.1  MCV 92.0 92.6 93.4  PLT 117* 112* 103*   Cardiac Enzymes:  Recent Labs Lab 04/19/16 0800 04/19/16 1208 04/20/16 1833  TROPONINI <0.03 <0.03 <0.03    BNP (last 3 results) No results for input(s): BNP in the last 8760 hours.  ProBNP (last 3 results) No results for input(s): PROBNP in the last 8760 hours.  CBG:  Recent Labs Lab 04/21/16 0306 04/21/16 0444 04/21/16 0743  GLUCAP 325* 308* 322*    Radiological Exams on Admission: Ct Abdomen Pelvis Wo Contrast  04/21/2016  CLINICAL DATA:  Acute onset of nausea, vomiting and epigastric abdominal pain. Initial encounter.  EXAM: CT ABDOMEN AND PELVIS WITHOUT CONTRAST TECHNIQUE: Multidetector CT imaging of the abdomen and pelvis was performed following the standard protocol without IV contrast. COMPARISON:  CT of the abdomen and pelvis from 04/19/2016 FINDINGS: Mild bibasilar atelectasis or scarring is noted. Diffuse coronary artery calcifications are seen. Diffuse inflammation is noted about the gallbladder, with trace pericholecystic fluid, compatible with acute cholecystitis. No definite stones are characterized on CT. The liver and spleen are unremarkable in appearance. The gallbladder is within normal limits. The pancreas and adrenal glands are unremarkable. Nonspecific perinephric stranding is noted bilaterally. The kidneys are otherwise grossly unremarkable. There is no evidence of hydronephrosis. No renal or ureteral stones are identified. No free fluid is identified. The small bowel is unremarkable in appearance.  The stomach is within normal limits. No acute vascular abnormalities are seen. Diffuse calcification is seen along the abdominal aorta and its branches. There is mild ectasia of the distal abdominal aorta, without evidence of aneurysmal dilatation. The patient is status post resection of the ascending colon. The ileocolic anastomosis at the right upper quadrant is grossly unremarkable, aside from scattered diverticulosis along the distal ascending and transverse colon. This is directly adjacent to the inflamed gallbladder. The bladder is moderately distended and grossly unremarkable. The patient is status post prostatectomy. No inguinal lymphadenopathy is seen. No acute osseous abnormalities are identified. IMPRESSION: 1. Acute cholecystitis, with diffuse inflammation about the gallbladder and trace pericholecystic fluid. No definite stones characterized on CT. 2. Diffuse calcification along the abdominal aorta and its branches. 3. Scattered diverticulosis along the distal ascending and transverse colon. No evidence  of diverticulitis. 4. Mild bibasilar atelectasis or scarring noted. 5. Diffuse coronary artery calcifications seen. Electronically Signed   By: Garald Balding M.D.   On: 04/21/2016 01:16   Dg Chest 1 View  04/20/2016  CLINICAL DATA:  75 year old male with abdominal pain since this morning. EXAM: CHEST 1 VIEW COMPARISON:  Chest x-ray 04/19/2016. FINDINGS: Chronic elevation of the right hemidiaphragm is unchanged. Low lung volumes. No consolidative airspace disease. No pleural effusions. No pneumothorax. No evidence of pulmonary edema. Heart size is normal. Upper mediastinal contours are within normal limits. Atherosclerosis in the thoracic aorta. Status post median sternotomy for CABG. IMPRESSION: 1. Low lung volumes without radiographic evidence of acute cardiopulmonary disease. 2. Aortic atherosclerosis. 3. Chronic elevation of the right hemidiaphragm is unchanged. Electronically Signed   By: Vinnie Langton M.D.   On: 04/20/2016 23:00   US Abdomen Limited Ruq  04/21/2016  CLINICAL DATA:  Acute onset of epigastric abdominal pain, nausea and vomiting. Leukocytosis. Initial encounter. EXAM: US ABDOMEN LIMITED - RIGHT UPPER QUADRANT COMPARISON:  CT of the abdomen and pelvis performed earlier today at 12:54 a.m. FINDINGS: Gallbladder: The gallbladder is distended, with diffuse gallbladder wall thickening, measuring up to 1.4 cm, and associated echogenic sludge. No definite stones are seen. A positive ultrasonographic Murphy's sign is noted, raising concern for acute cholecystitis. Common bile duct: Diameter: 3.3 cm, within normal limits in caliber. Liver: No focal lesion identified. Within normal limits in parenchymal echogenicity. Mildly increased renal parenchymal echogenicity may reflect medical renal disease. IMPRESSION: 1. Gallbladder distention, with diffuse gallbladder wall thickening, measuring up to 1.4 cm, and associated echogenic sludge. No definite stones seen. Positive ultrasonographic Murphy's sign,  concerning for acute cholecystitis. 2. Question of medical renal disease. Electronically Signed   By: Garald Balding M.D.   On: 04/21/2016 04:55    EKG: Independently reviewed.  Assessment/Plan Active Problems:   Cholecystitis, acute   Will change from LR to NS. Add low dose Lantus qhs. Agree with SSI. Continue current cardiac/BP regimen. Cardiology consult pending. Will order echo. Follow renal fxn closely. Thank you for the consult. Will follow with you. Call if questions arise.  Diet: per surgery Fluids: per surgery DVT Prophylaxis: per surgery  Code Status: FULL  Family Communication: yes  Disposition Plan: home  Time spent: 45 min

## 2016-04-21 NOTE — H&P (Signed)
Patient ID: [name], male   DOB: 1940-11-03, 75 y.o.   MRN: [id number]  History of Present Illness [name] is a 75 y.o. male with persistent right upper quadrant and epigastric pain, intitially began 7/7 at 0100, woke him from sleep, located in right upper quadrant, associated with nausea and emesis.  Not associated with meals.  Then presented to the ED here, at which time he had a normal CBC, a normal RUQ [place] and a CT scan which did not show any findings to explain his symptoms.  Of note his [place] at that time did not show any stones.  He was given a GI cocktail and discharged from the ED.  He had some mild improvement in his symptoms but then the evening of 7/8 he had an acute worsening of his symptoms and now has constant, 8/10 pain in the right upper quadrant, again associated with N/V and some diarrhea.  Denies hematemesis, denies melena.  Takes aleve very sparingly. Has not had a recent colonoscopy, does not take PPI or H2 blocker.  Past Medical History Past Medical History  Diagnosis Date  . Hypertension   . Hyperlipidemia   . Thyroid disease   . Diabetes mellitus without complication ([facility])   . Cancer ([facility])   . Coronary artery disease      No history of PUD  Past Surgical History  Procedure Laterality Date  . Prostate surgery  2002    BPH benign pathology  . Coronary artery bypass graft  1997    x 3  . Spine surgery  1989    Lumbar disc  . 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 Appendectomy  2011  . Hernia repair  2011    Ventral hernia  . Knee surgery Left     Allergies  Allergen Reactions  . Ace Inhibitors Other (See Comments)    Reaction:  Raises potassium     Current Facility-Administered Medications  Medication Dose Route Frequency Provider Last Rate Last Dose  . lactated ringers infusion   Intravenous Continuous [place] Makoto Sellitto, MD      . morphine 2 MG/ML injection 2 mg  2 mg Intravenous Q2H PRN [place] Akylah Hascall, MD      . ondansetron  (ZOFRAN-ODT) disintegrating tablet 4 mg  4 mg Oral Q6H PRN [place] Salim Forero, MD       Or  . ondansetron [facility]) injection 4 mg  4 mg Intravenous Q6H PRN [place] Talaysha Freeberg, MD      . piperacillin-tazobactam (ZOSYN) IVPB 3.375 g  3.375 g Intravenous Q8H [place] Shaketta Rill, MD       Current Outpatient Prescriptions  Medication Sig Dispense Refill  . aspirin EC 325 MG tablet Take 325 mg by mouth daily.    Marland Kitchen atenolol (TENORMIN) 25 MG tablet TAKE ONE TABLET BY MOUTH ONCE DAILY 30 tablet 5  . Blood Glucose Monitoring Suppl (RELION PRIME MONITOR) [place]     . glucose blood (RELION PRIME TEST) test strip     . insulin NPH Human (HUMULIN N,NOVOLIN N) 100 UNIT/ML injection Inject 30 Units into the skin 2 (two) times daily.    . insulin regular (NOVOLIN R,HUMULIN R) 100 units/mL injection Inject 15-16 Units into the skin 2 (two) times daily. Pt takes depending on blood sugar.    . levothyroxine (SYNTHROID, LEVOTHROID) 300 MCG tablet Take 300 mcg by mouth daily before breakfast.    . ranitidine (ZANTAC) 150 MG tablet Take 150 mg by mouth  2 (two) times daily as needed for heartburn.    . simvastatin (ZOCOR) 20 MG tablet TAKE ONE TABLET BY MOUTH AT BEDTIME 30 tablet 5    Family History Family History  Problem Relation Age of Onset  . Cancer Mother     Lung  . Diabetes Mother   . Heart disease Mother        Social History Social History  Substance Use Topics  . Smoking status: Former Smoker    Quit date: 06/24/1996  . Smokeless tobacco: None  . Alcohol Use: No        ROS 12 pt ROS as per HPI, otherwise negative   Physical Exam Blood pressure 160/99, pulse 68, temperature 97.8 F (36.6 C), temperature source Oral, resp. rate 24, height 6\' 2"  (1.88 m), weight 250 lb (113.399 kg), SpO2 93 %.  CONSTITUTIONAL: A+Ox3 NAD EYES: Pupils equal, round, and reactive to light, Sclera non-icteric. EARS, NOSE, MOUTH AND THROAT: The oropharynx is clear. Oral mucosa is pink and moist. Hearing is intact to voice.  NECK:  Trachea is midline, and there is no jugular venous distension.  LYMPH NODES:  Lymph nodes in the neck are not enlarged. RESPIRATORY: Normal respiratory effort without pathologic use of accessory muscles. CARDIOVASCULAR: Heart is regular without murmurs, gallops, or rubs. GI: The abdomen is soft, TTP most in RUQ, some voluntary guarding, +Murphy's sign GU: deferred MUSCULOSKELETAL:  Normal muscle strength and tone in all four extremities.    SKIN: Skin turgor is normal. There are no pathologic skin lesions.  NEUROLOGIC:  Motor and sensation is grossly normal.  Cranial nerves are grossly intact. PSYCH:  Alert and oriented to person, place and time. Affect is normal.  Data Reviewed WBC 16.3, CT scan with inflammation and stranding in RUQ, around GB and in GB fossa, and in mesentery  I have personally reviewed the patient's imaging and medical records.    Assessment    10M with RUQ abdominal pain  Plan    Although he does appear to have fluid and stranding around his GB on the CT scan, it is unusual to have acute cholecystitis in the setting of a documented US showing no stones and no wall thickening less than 48 hours ago.  His prior colonic anastomosis is also directly adjacent to the GB fossa, and there is edema in the mesentery of the colon and duodenum in the immediate area as well.  Differential includes colitis near the anstamosis (ischemic vs infectious) and also duodenitis from PUD or other causes.  I will repeat the RUQ [place] now, if it shows stones and signs of cholecystitis, then we will talk about cholecystectomy, if not then will continue bowel rest, IV abx, and further evaluation.  Discussed this at length with patient and his family, all questions and concerns addressed.  Face-to-face time spent with the patient and care providers was 30 minutes, with more than 50% of the time spent counseling, educating, and coordinating care of the patient.     [place] Keyonda Bickle 04/21/2016, 2:15  AM

## 2016-04-21 NOTE — ED Notes (Signed)
Patient tolerating CT contrast well.

## 2016-04-21 NOTE — ED Provider Notes (Signed)
-----------------------------------------   2:25 AM on 04/21/2016 -----------------------------------------   Blood pressure 160/99, pulse 68, temperature 97.8 F (36.6 C), temperature source Oral, resp. rate 24, height 6\' 2"  (1.88 m), weight 250 lb (113.399 kg), SpO2 93 %.  Assuming care from Dr. Clearnce Hasten.  In short, [name] is a 75 y.o. male with a chief complaint of Abdominal Pain .  Refer to the original H&P for additional details.  The current plan of care is to follow up the results of the CT scan and disposition the patient.   CT abdomen and pelvis: Acute cholecystitis with diffuse inflammation about the gallbladder and trace pericholecystic fluid, no definite stones characterized on CT, diffuse calcification along the abdominal aorta and its branches, scattered diverticulosis along the distal ascending and transverse colon, no evidence of diverticulitis, mild bibasilar atelectasis or scarring noted, diffuse coronary artery calcifications seen.  I consulted the surgical service to evaluate the patient. While the surgeon is willing to admit the patient, he is unsure if the patient has cholecystitis. He is going to send the patient for an ultrasound  The patient will be admitted.  Loney Hering, MD 04/21/16 [phone]

## 2016-04-21 NOTE — ED Notes (Signed)
Report called to floor and given to [place].

## 2016-04-22 ENCOUNTER — Inpatient Hospital Stay: Admit: 2016-04-22 | Payer: PPO

## 2016-04-22 ENCOUNTER — Inpatient Hospital Stay
Admit: 2016-04-22 | Discharge: 2016-04-22 | Disposition: A | Discharge status: Home / Self Care | Payer: PPO | Attending: Internal Medicine | Admitting: Internal Medicine

## 2016-04-22 ENCOUNTER — Inpatient Hospital Stay: Payer: PPO

## 2016-04-22 DIAGNOSIS — K810 Acute cholecystitis: Principal | ICD-10-CM

## 2016-04-22 LAB — CBC WITH DIFFERENTIAL/PLATELET
BASOS ABS: 0.1 10*3/uL (ref 0–0.1)
Eosinophils Absolute: 0 10*3/uL (ref 0–0.7)
HEMATOCRIT: 41 % (ref 40.0–52.0)
Hemoglobin: 13.9 g/dL (ref 13.0–18.0)
Lymphocytes Relative: 8 %
Lymphs Abs: 1.1 10*3/uL (ref 1.0–3.6)
MCH: 31.3 pg (ref 26.0–34.0)
MCHC: 33.8 g/dL (ref 32.0–36.0)
MCV: 92.6 fL (ref 80.0–100.0)
MONO ABS: 1.3 10*3/uL — AB (ref 0.2–1.0)
Monocytes Relative: 9 %
NEUTROS ABS: 11.4 10*3/uL — AB (ref 1.4–6.5)
Neutrophils Relative %: 82 %
PLATELETS: 96 10*3/uL — AB (ref 150–440)
RBC: 4.43 MIL/uL (ref 4.40–5.90)
RDW: 14.7 % — AB (ref 11.5–14.5)
WBC: 13.9 10*3/uL — AB (ref 3.8–10.6)

## 2016-04-22 LAB — COMPREHENSIVE METABOLIC PANEL
ALBUMIN: 2.5 g/dL — AB (ref 3.5–5.0)
ALT: 26 U/L (ref 17–63)
AST: 40 U/L (ref 15–41)
Alkaline Phosphatase: 115 U/L (ref 38–126)
Anion gap: 7 (ref 5–15)
BUN: 38 mg/dL — ABNORMAL HIGH (ref 6–20)
CALCIUM: 8.1 mg/dL — AB (ref 8.9–10.3)
CHLORIDE: 104 mmol/L (ref 101–111)
CO2: 20 mmol/L — ABNORMAL LOW (ref 22–32)
CREATININE: 3 mg/dL — AB (ref 0.61–1.24)
GFR calc Af Amer: 22 mL/min — ABNORMAL LOW (ref >60)
GFR calc non Af Amer: 19 mL/min — ABNORMAL LOW (ref >60)
GLUCOSE: 190 mg/dL — AB (ref 65–99)
POTASSIUM: 4.4 mmol/L (ref 3.5–5.1)
Sodium: 131 mmol/L — ABNORMAL LOW (ref 135–145)
TOTAL PROTEIN: 5.7 g/dL — AB (ref 6.5–8.1)
Total Bilirubin: 1.9 mg/dL — ABNORMAL HIGH (ref 0.3–1.2)

## 2016-04-22 LAB — GLUCOSE, CAPILLARY
GLUCOSE-CAPILLARY: 200 mg/dL — AB (ref 65–99)
GLUCOSE-CAPILLARY: 204 mg/dL — AB (ref 65–99)
GLUCOSE-CAPILLARY: 207 mg/dL — AB (ref 65–99)
GLUCOSE-CAPILLARY: 238 mg/dL — AB (ref 65–99)
Glucose-Capillary: 270 mg/dL — ABNORMAL HIGH (ref 65–99)
Glucose-Capillary: 221 mg/dL — ABNORMAL HIGH (ref 65–99)

## 2016-04-22 MED ORDER — TECHNETIUM TC 99M MEBROFENIN IV KIT
5.2300 | PACK | Freq: Once | INTRAVENOUS | Status: AC | PRN
  Administered 2016-04-22: 5.23 via INTRAVENOUS

## 2016-04-22 MED ORDER — ENOXAPARIN SODIUM 40 MG/0.4ML ~~LOC~~ SOLN
40.0000 mg | SUBCUTANEOUS | Status: DC
  Administered 2016-04-23 – 2016-04-26 (×5): 40 mg via SUBCUTANEOUS
  Filled 2016-04-22 (×4): qty 0.4

## 2016-04-22 MED ORDER — MORPHINE SULFATE (PF) 4 MG/ML IV SOLN
3.0000 mg | Freq: Once | INTRAVENOUS | Status: AC
  Administered 2016-04-22: 3 mg via INTRAVENOUS
  Filled 2016-04-22: qty 1

## 2016-04-22 NOTE — Progress Notes (Signed)
HIDA suggests cystic duct obstruction.   Will plan surgery in am as discussed with patient and family if cardiac echo is stable.   Will await findings prior to final surgical decision

## 2016-04-22 NOTE — Progress Notes (Signed)
Inpatient Diabetes Program Recommendations  AACE/ADA: New Consensus Statement on Inpatient Glycemic Control (2015)  Target Ranges:  Prepandial:   less than 140 mg/dL      Peak postprandial:   less than 180 mg/dL (1-2 hours)      Critically ill patients:  140 - 180 mg/dL  Results for INMAN, KOSIN (MRN [id number]) as of 04/22/2016 08:55  Ref. Range 04/21/2016 07:43 04/21/2016 16:35 04/21/2016 20:05 04/21/2016 21:55 04/22/2016 00:04 04/22/2016 03:33 04/22/2016 07:39  Glucose-Capillary Latest Ref Range: 65-99 mg/dL 322 (H) 236 (H) 330 (H) 270 (H) 207 (H) 200 (H) 221 (H)    Review of Glycemic Control  Diabetes history: DM2 Outpatient Diabetes medications: NPH 30 units BID, Regular 15-16 units BID Current orders for Inpatient glycemic control: Lantus 10 units QHS, Novolog 0-15 units Q4H  Inpatient Diabetes Program Recommendations: Insulin - Basal: Please consider increasing Lantus to 25 units QHS. Correction (SSI): Please consider increasing Novolog correction to Resistant scale. HgbA1C: Please consider ordering an A1C to evaluate glycemic control over the past 2-3 months.  Thanks, Barnie Alderman, RN, MSN, CDE Diabetes Coordinator Inpatient Diabetes Program [phone] (Team Pager from [place] to [place]) [phone] ([facility] office) [phone] [facility] office) [phone] [facility] office)

## 2016-04-22 NOTE — Progress Notes (Signed)
[facility] NAME: [name]    MR#:  [id number]  DATE OF BIRTH:  Jul 09, 1941  SUBJECTIVE:  CHIEF COMPLAINT:   Chief Complaint  Patient presents with  . Abdominal Pain   Found to have Acute cholecystitis- on surgical service with IV Abx. Dehydration with some worsening renal func.  have Hx of CAD- medical consult for pre-op eval.  REVIEW OF SYSTEMS:  CONSTITUTIONAL: No fever, fatigue or weakness.  EYES: No blurred or double vision.  EARS, NOSE, AND THROAT: No tinnitus or ear pain.  RESPIRATORY: No cough, shortness of breath, wheezing or hemoptysis.  CARDIOVASCULAR: No chest pain, orthopnea, edema.  GASTROINTESTINAL: some nausea,no vomiting, diarrhea , Right upper quadrant abdominal pain.  GENITOURINARY: No dysuria, hematuria.  ENDOCRINE: No polyuria, nocturia,  HEMATOLOGY: No anemia, easy bruising or bleeding SKIN: No rash or lesion. MUSCULOSKELETAL: No joint pain or arthritis.   NEUROLOGIC: No tingling, numbness, weakness.  PSYCHIATRY: No anxiety or depression.   ROS  DRUG ALLERGIES:   Allergies  Allergen Reactions  . Ace Inhibitors Other (See Comments)    Reaction:  Raises potassium     VITALS:  Blood pressure 131/72, pulse 83, temperature 98.6 F (37 C), temperature source Oral, resp. rate 22, height 6\' 2"  (1.88 m), weight 114.17 kg (251 lb 11.2 oz), SpO2 92 %.  PHYSICAL EXAMINATION:  GENERAL:  75 y.o.-year-old patient lying in the bed with no acute distress.  EYES: Pupils equal, round, reactive to light and accommodation. No scleral icterus. Extraocular muscles intact.  HEENT: Head atraumatic, normocephalic. Oropharynx and nasopharynx clear.  NECK:  Supple, no jugular venous distention. No thyroid enlargement, no tenderness.  LUNGS: Normal breath sounds bilaterally, no wheezing, rales,rhonchi or crepitation. No use of accessory muscles of respiration.  CARDIOVASCULAR: S1, S2 normal. No murmurs, rubs, or  gallops.  ABDOMEN: Soft, Right upper quadrant tenderness, nondistended. Bowel sounds present. No organomegaly or mass.  EXTREMITIES: No pedal edema, cyanosis, or clubbing.  NEUROLOGIC: Cranial nerves II through XII are intact. Muscle strength 5/5 in all extremities. Sensation intact. Gait not checked.  PSYCHIATRIC: The patient is alert and oriented x 3.  SKIN: No obvious rash, lesion, or ulcer.   Physical Exam LABORATORY PANEL:   CBC  Recent Labs Lab 04/22/16 0413  WBC 13.9*  HGB 13.9  HCT 41.0  PLT 96*   ------------------------------------------------------------------------------------------------------------------  Chemistries   Recent Labs Lab 04/22/16 0413  NA 131*  K 4.4  CL 104  CO2 20*  GLUCOSE 190*  BUN 38*  CREATININE 3.00*  CALCIUM 8.1*  AST 40  ALT 26  ALKPHOS 115  BILITOT 1.9*   ------------------------------------------------------------------------------------------------------------------  Cardiac Enzymes  Recent Labs Lab 04/19/16 1208 04/20/16 1833  TROPONINI <0.03 <0.03   ------------------------------------------------------------------------------------------------------------------  RADIOLOGY:  Ct Abdomen Pelvis Wo Contrast  04/21/2016  CLINICAL DATA:  Acute onset of nausea, vomiting and epigastric abdominal pain. Initial encounter. EXAM: CT ABDOMEN AND PELVIS WITHOUT CONTRAST TECHNIQUE: Multidetector CT imaging of the abdomen and pelvis was performed following the standard protocol without IV contrast. COMPARISON:  CT of the abdomen and pelvis from 04/19/2016 FINDINGS: Mild bibasilar atelectasis or scarring is noted. Diffuse coronary artery calcifications are seen. Diffuse inflammation is noted about the gallbladder, with trace pericholecystic fluid, compatible with acute cholecystitis. No definite stones are characterized on CT. The liver and spleen are unremarkable in appearance. The gallbladder is within normal limits. The pancreas and  adrenal glands are unremarkable. Nonspecific perinephric stranding is noted bilaterally. The  kidneys are otherwise grossly unremarkable. There is no evidence of hydronephrosis. No renal or ureteral stones are identified. No free fluid is identified. The small bowel is unremarkable in appearance. The stomach is within normal limits. No acute vascular abnormalities are seen. Diffuse calcification is seen along the abdominal aorta and its branches. There is mild ectasia of the distal abdominal aorta, without evidence of aneurysmal dilatation. The patient is status post resection of the ascending colon. The ileocolic anastomosis at the right upper quadrant is grossly unremarkable, aside from scattered diverticulosis along the distal ascending and transverse colon. This is directly adjacent to the inflamed gallbladder. The bladder is moderately distended and grossly unremarkable. The patient is status post prostatectomy. No inguinal lymphadenopathy is seen. No acute osseous abnormalities are identified. IMPRESSION: 1. Acute cholecystitis, with diffuse inflammation about the gallbladder and trace pericholecystic fluid. No definite stones characterized on CT. 2. Diffuse calcification along the abdominal aorta and its branches. 3. Scattered diverticulosis along the distal ascending and transverse colon. No evidence of diverticulitis. 4. Mild bibasilar atelectasis or scarring noted. 5. Diffuse coronary artery calcifications seen. Electronically Signed   By: Garald Balding M.D.   On: 04/21/2016 01:16   Dg Chest 1 View  04/20/2016  CLINICAL DATA:  75 year old male with abdominal pain since this morning. EXAM: CHEST 1 VIEW COMPARISON:  Chest x-ray 04/19/2016. FINDINGS: Chronic elevation of the right hemidiaphragm is unchanged. Low lung volumes. No consolidative airspace disease. No pleural effusions. No pneumothorax. No evidence of pulmonary edema. Heart size is normal. Upper mediastinal contours are within normal limits.  Atherosclerosis in the thoracic aorta. Status post median sternotomy for CABG. IMPRESSION: 1. Low lung volumes without radiographic evidence of acute cardiopulmonary disease. 2. Aortic atherosclerosis. 3. Chronic elevation of the right hemidiaphragm is unchanged. Electronically Signed   By: Vinnie Langton M.D.   On: 04/20/2016 23:00   Nm Hepato W/eject Fract  04/22/2016  CLINICAL DATA:  Acute cholecystitis by ultrasound and CT. EXAM: NUCLEAR MEDICINE HEPATOBILIARY IMAGING TECHNIQUE: Sequential images of the abdomen were obtained out to 60 minutes following intravenous administration of radiopharmaceutical. RADIOPHARMACEUTICALS:  5.3 mCi Tc-20m  Choletec IV COMPARISON:  04/21/2016 CT and ultrasound FINDINGS: Normal hepatic uptake and biliary excretion of the radiopharmaceutical. Activity progresses throughout the small bowel. There is reflux of activity into the duodenum and stomach. Gallbladder is not visualized at approximately 1 hour. 3 mg morphine administered. Despite this and additional delayed imaging out to 4 hours, the gallbladder is not demonstrated. This is compatible with cystic duct obstruction and cholecystitis. IMPRESSION: Nonvisualization of the gallbladder despite administered morphine and delayed imaging compatible with cystic duct obstruction secondary to cholecystitis. No biliary duct obstruction or small bowel obstruction. These results will be called to the ordering clinician or representative by the Radiologist Assistant, and communication documented in the PACS or zVision Dashboard. Electronically Signed   By: Jerilynn Mages.  Shick M.D.   On: 04/22/2016 14:44   US Abdomen Limited Ruq  04/21/2016  CLINICAL DATA:  Acute onset of epigastric abdominal pain, nausea and vomiting. Leukocytosis. Initial encounter. EXAM: US ABDOMEN LIMITED - RIGHT UPPER QUADRANT COMPARISON:  CT of the abdomen and pelvis performed earlier today at 12:54 a.m. FINDINGS: Gallbladder: The gallbladder is distended, with diffuse  gallbladder wall thickening, measuring up to 1.4 cm, and associated echogenic sludge. No definite stones are seen. A positive ultrasonographic Murphy's sign is noted, raising concern for acute cholecystitis. Common bile duct: Diameter: 3.3 cm, within normal limits in caliber. Liver: No focal lesion identified.  Within normal limits in parenchymal echogenicity. Mildly increased renal parenchymal echogenicity may reflect medical renal disease. IMPRESSION: 1. Gallbladder distention, with diffuse gallbladder wall thickening, measuring up to 1.4 cm, and associated echogenic sludge. No definite stones seen. Positive ultrasonographic Murphy's sign, concerning for acute cholecystitis. 2. Question of medical renal disease. Electronically Signed   By: Garald Balding M.D.   On: 04/21/2016 04:55    ASSESSMENT AND PLAN:   Active Problems:   Cholecystitis, acute  * Acute cholecystitis   Confirmed by CT scan, ultrasound and HIDA scan.   Dr. Geryl Rankins is planning for surgery, likely tomorrow.   This patient has cardiac history , cardiology consult is called in.   A repeated echocardiogram result and further approval from cardiologist.   If cardiologist approved to proceed for surgery tomorrow, then I would be okay for patient going for surgery from medical point of view: Because patient's other medical issues are stable and under control.  * Acute on chronic renal failure   Worsening in renal function because of poor oral intake due to pain and cholecystitis for last 2-3 days.   IV fluid and continue monitoring.  * Diabetes   On long-acting insulin sliding scale coverage in hospital.  * Coronary artery disease   As he may go for surgery currently hold aspirin and statin and other oral medications. Resume when allowed after surgery.  * Hypertension   Blood pressure is stable, continue monitoring without any oral medications.  * Hyperlipidemia   Hold statins for now as nothing by mouth we can resume after 2  days.  All the records are reviewed and case discussed with Care Management/Social Workerr. Management plans discussed with the patient, family and they are in agreement.  CODE STATUS: Full code  TOTAL TIME TAKING CARE OF THIS PATIENT: 35 minutes.   POSSIBLE D/C IN 1-2 DAYS, DEPENDING ON CLINICAL CONDITION.   Vaughan Basta M.D on 04/22/2016   Between 7am to 6pm - Pager - [phone]  After 6pm go to [email or web address] - password [email or web address] [facility]  Office  [phone]  CC: Primary care physician; Halina Maidens, MD  Note: This dictation was prepared with Dragon dictation along with smaller phrase technology. Any transcriptional errors that result from this process are unintentional.

## 2016-04-22 NOTE — Consult Note (Signed)
[facility]  CARDIOLOGY CONSULT NOTE  Patient ID: [name] MRN: [id number] DOB/AGE: 03-19-1941 75 y.o.  Admit date: 04/20/2016 Referring Physician Dr. Burt Knack Primary Physician   Primary Cardiologist Dr. Ubaldo Glassing Reason for Consultation preop  HPI: Pt is a 75 yo white male with history of stage iii ckd, cad s/p cabg in 1997 with lima to lad, svg to ri and rca who was admitted with abdominal pain and was noted to have acute cholecystitis. He is being evaluated for possible cholecystectomy. He denies chest pain or sob. He has been stable form cardiac standpoint. EKG reveals nsr with no ischemia.   Review of Systems  Constitutional: Negative.   HENT: Negative.   Eyes: Negative.   Respiratory: Negative.   Cardiovascular: Negative.   Gastrointestinal: Positive for abdominal pain.  Genitourinary: Negative.   Musculoskeletal: Negative.   Skin: Negative.   Neurological: Negative.   Endo/Heme/Allergies: Negative.   Psychiatric/Behavioral: Negative.     Past Medical History  Diagnosis Date  . Hypertension   . Hyperlipidemia   . Thyroid disease   . Diabetes mellitus without complication ([place])   . Cancer ([place])   . Coronary artery disease     Family History  Problem Relation Age of Onset  . Cancer Mother     Lung  . Diabetes Mother   . Heart disease Mother     Social History   Social History  . Marital Status: Married    Spouse Name: N/A  . Number of Children: N/A  . Years of Education: N/A   Occupational History  . Not on file.   Social History Main Topics  . Smoking status: Former Smoker    Quit date: 06/24/1996  . Smokeless tobacco: Not on file  . Alcohol Use: No  . Drug Use: No  . Sexual Activity: Not on file   Other Topics Concern  . Not on file   Social History Narrative    Past Surgical History  Procedure Laterality Date  . Prostate surgery  2002    BPH benign pathology  . Coronary artery bypass graft  1997    x  3  . Spine surgery  1989    Lumbar disc  . 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 Appendectomy  2011  . Hernia repair  2011    Ventral hernia  . Knee surgery Left      Prescriptions prior to admission  Medication Sig Dispense Refill Last Dose  . aspirin EC 325 MG tablet Take 325 mg by mouth daily.   04/20/2016 at Unknown time  . atenolol (TENORMIN) 25 MG tablet TAKE ONE TABLET BY MOUTH ONCE DAILY 30 tablet 5 04/20/2016 at Unknown time  . Blood Glucose Monitoring Suppl ([place]) DEVI    04/20/2016 at Unknown time  . glucose blood (RELION PRIME TEST) test strip    04/20/2016 at Unknown time  . insulin NPH Human (HUMULIN N,NOVOLIN N) 100 UNIT/ML injection Inject 30 Units into the skin 2 (two) times daily.   04/20/2016 at Unknown time  . insulin regular (NOVOLIN R,HUMULIN R) 100 units/mL injection Inject 15-16 Units into the skin 2 (two) times daily. Pt takes depending on blood sugar.   prn at prn  . levothyroxine (SYNTHROID, LEVOTHROID) 300 MCG tablet Take 300 mcg by mouth daily before breakfast.   04/20/2016 at Unknown time  . ranitidine (ZANTAC) 150 MG tablet Take 150 mg by mouth 2 (  two) times daily as needed for heartburn.   04/20/2016 at Unknown time  . simvastatin (ZOCOR) 20 MG tablet TAKE ONE TABLET BY MOUTH AT BEDTIME 30 tablet 5 04/19/2016 at Unknown time    Physical Exam: Blood pressure 131/72, pulse 83, temperature 98.6 F (37 C), temperature source Oral, resp. rate 22, height 6\' 2"  (1.88 m), weight 114.17 kg (251 lb 11.2 oz), SpO2 92 %.   Wt Readings from Last 1 Encounters:  04/21/16 114.17 kg (251 lb 11.2 oz)     General appearance: alert and cooperative Resp: clear to auscultation bilaterally Cardio: regular rate and rhythm GI: abnormal findings:  moderate tenderness in the RUQ Extremities: extremities normal, atraumatic, no cyanosis or edema Neurologic: Grossly normal  Labs:   Lab Results  Component Value Date   WBC 13.9* 04/22/2016   HGB  13.9 04/22/2016   HCT 41.0 04/22/2016   MCV 92.6 04/22/2016   PLT 96* 04/22/2016    Recent Labs Lab 04/22/16 0413  NA 131*  K 4.4  CL 104  CO2 20*  BUN 38*  CREATININE 3.00*  CALCIUM 8.1*  PROT 5.7*  BILITOT 1.9*  ALKPHOS 115  ALT 26  AST 40  GLUCOSE 190*   Lab Results  Component Value Date   CKTOTAL 222 11/08/2011   CKMB 2.7 11/08/2011   TROPONINI <0.03 04/20/2016      Radiology: No pulmonary edema EKG: nsr with no ischemia  ASSESSMENT AND PLAN:  75 yo male with history of  Cad s/p cabg x 3 in 1997 who has been stable from cardiac standpoint who was admitted with acute cholecystitis. Asked to see pateint with regard to cardiac risk for surgery should this be necessary. Has negative troponin. Will review echo when available. HIDA scan today to evaluate gall bladder function.  Further recs after echo completed.  Renal funciton is decreased with creatinine of 3.0 Signed: Teodoro Spray MD, [facility] 04/22/2016, 8:03 AM

## 2016-04-22 NOTE — Progress Notes (Signed)
Subjective:   I have reviewed his history and physical examination. He is continuing to have right upper quadrant abdominal pain. He is not nauseated but is remaining nothing by mouth. His creatinine is slightly elevated from admission. He was seen by Dr. Satira Mccallum this morning and the plan currently is to a HIDA scan and cardiac echo.  Vital signs in last 24 hours: Temp:  [98.1 F (36.7 C)-98.7 F (37.1 C)] 98.6 F (37 C) (07/10 0340) Pulse Rate:  [72-99] 83 (07/10 0340) Resp:  [20-22] 22 (07/10 0340) BP: (111-142)/(62-88) 131/72 mmHg (07/10 0340) SpO2:  [92 %-99 %] 92 % (07/10 0340) Last BM Date: 04/19/16  Intake/Output from previous day: 07/09 0701 - 07/10 0700 In: 2367 [I.V.:2192; IV Piggyback:175] Out: [id number] [Urine:1275]  Exam:  Abdomen is moderately tender with some mild guarding and no rebound. He has active bowel sounds. Of note is the fact that he has a large midline incision from his sternum to his suprapubic area.  Lab Results:  CBC  Recent Labs  04/21/16 0520 04/22/16 0413  WBC 18.5* 13.9*  HGB 14.9 13.9  HCT 45.1 41.0  PLT 103* 96*   CMP     Component Value Date/Time   NA 131* 04/22/2016 0413   NA 138 03/16/2015 1047   NA 140 12/10/2012 0726   K 4.4 04/22/2016 0413   K 4.6 12/10/2012 0726   CL 104 04/22/2016 0413   CL 111* 12/10/2012 0726   CO2 20* 04/22/2016 0413   CO2 20* 12/10/2012 0726   GLUCOSE 190* 04/22/2016 0413   GLUCOSE 164* 03/16/2015 1047   GLUCOSE 181* 12/10/2012 0726   BUN 38* 04/22/2016 0413   BUN 27 03/16/2015 1047   BUN 33* 09/01/2013 0951   CREATININE 3.00* 04/22/2016 0413   CREATININE 2.27* 09/01/2013 0951   CALCIUM 8.1* 04/22/2016 0413   CALCIUM 8.1* 12/10/2012 0726   PROT 5.7* 04/22/2016 0413   PROT 7.0 03/19/2016 0954   PROT 6.3* 11/07/2011 1859   ALBUMIN 2.5* 04/22/2016 0413   ALBUMIN 3.9 03/19/2016 0954   ALBUMIN 2.8* 11/07/2011 1859   AST 40 04/22/2016 0413   AST 31 11/07/2011 1859   ALT 26 04/22/2016 0413   ALT 33  11/07/2011 1859   ALKPHOS 115 04/22/2016 0413   ALKPHOS 88 11/07/2011 1859   BILITOT 1.9* 04/22/2016 0413   BILITOT 0.4 03/19/2016 0954   BILITOT 0.3 11/07/2011 1859   GFRNONAA 19* 04/22/2016 0413   GFRNONAA 28* 09/01/2013 0951   GFRNONAA 37* 11/08/2011 0252   GFRAA 22* 04/22/2016 0413   GFRAA 32* 09/01/2013 0951   GFRAA 45* 11/08/2011 0252   PT/INR No results for input(s): LABPROT, INR in the last 72 hours.  Studies/Results: Ct Abdomen Pelvis Wo Contrast  04/21/2016  CLINICAL DATA:  Acute onset of nausea, vomiting and epigastric abdominal pain. Initial encounter. EXAM: CT ABDOMEN AND PELVIS WITHOUT CONTRAST TECHNIQUE: Multidetector CT imaging of the abdomen and pelvis was performed following the standard protocol without IV contrast. COMPARISON:  CT of the abdomen and pelvis from 04/19/2016 FINDINGS: Mild bibasilar atelectasis or scarring is noted. Diffuse coronary artery calcifications are seen. Diffuse inflammation is noted about the gallbladder, with trace pericholecystic fluid, compatible with acute cholecystitis. No definite stones are characterized on CT. The liver and spleen are unremarkable in appearance. The gallbladder is within normal limits. The pancreas and adrenal glands are unremarkable. Nonspecific perinephric stranding is noted bilaterally. The kidneys are otherwise grossly unremarkable. There is no evidence of hydronephrosis. No renal or ureteral  stones are identified. No free fluid is identified. The small bowel is unremarkable in appearance. The stomach is within normal limits. No acute vascular abnormalities are seen. Diffuse calcification is seen along the abdominal aorta and its branches. There is mild ectasia of the distal abdominal aorta, without evidence of aneurysmal dilatation. The patient is status post resection of the ascending colon. The ileocolic anastomosis at the right upper quadrant is grossly unremarkable, aside from scattered diverticulosis along the distal  ascending and transverse colon. This is directly adjacent to the inflamed gallbladder. The bladder is moderately distended and grossly unremarkable. The patient is status post prostatectomy. No inguinal lymphadenopathy is seen. No acute osseous abnormalities are identified. IMPRESSION: 1. Acute cholecystitis, with diffuse inflammation about the gallbladder and trace pericholecystic fluid. No definite stones characterized on CT. 2. Diffuse calcification along the abdominal aorta and its branches. 3. Scattered diverticulosis along the distal ascending and transverse colon. No evidence of diverticulitis. 4. Mild bibasilar atelectasis or scarring noted. 5. Diffuse coronary artery calcifications seen. Electronically Signed   By: Garald Balding M.D.   On: 04/21/2016 01:16   Dg Chest 1 View  04/20/2016  CLINICAL DATA:  75 year old male with abdominal pain since this morning. EXAM: CHEST 1 VIEW COMPARISON:  Chest x-ray 04/19/2016. FINDINGS: Chronic elevation of the right hemidiaphragm is unchanged. Low lung volumes. No consolidative airspace disease. No pleural effusions. No pneumothorax. No evidence of pulmonary edema. Heart size is normal. Upper mediastinal contours are within normal limits. Atherosclerosis in the thoracic aorta. Status post median sternotomy for CABG. IMPRESSION: 1. Low lung volumes without radiographic evidence of acute cardiopulmonary disease. 2. Aortic atherosclerosis. 3. Chronic elevation of the right hemidiaphragm is unchanged. Electronically Signed   By: Vinnie Langton M.D.   On: 04/20/2016 23:00   US Abdomen Limited Ruq  04/21/2016  CLINICAL DATA:  Acute onset of epigastric abdominal pain, nausea and vomiting. Leukocytosis. Initial encounter. EXAM: US ABDOMEN LIMITED - RIGHT UPPER QUADRANT COMPARISON:  CT of the abdomen and pelvis performed earlier today at 12:54 a.m. FINDINGS: Gallbladder: The gallbladder is distended, with diffuse gallbladder wall thickening, measuring up to 1.4 cm, and  associated echogenic sludge. No definite stones are seen. A positive ultrasonographic Murphy's sign is noted, raising concern for acute cholecystitis. Common bile duct: Diameter: 3.3 cm, within normal limits in caliber. Liver: No focal lesion identified. Within normal limits in parenchymal echogenicity. Mildly increased renal parenchymal echogenicity may reflect medical renal disease. IMPRESSION: 1. Gallbladder distention, with diffuse gallbladder wall thickening, measuring up to 1.4 cm, and associated echogenic sludge. No definite stones seen. Positive ultrasonographic Murphy's sign, concerning for acute cholecystitis. 2. Question of medical renal disease. Electronically Signed   By: Garald Balding M.D.   On: 04/21/2016 04:55    Assessment/Plan: His clinical presentation and laboratory workup along with his imaging suggests biliary tract disease. We will await cardiac clearance and confirmation of his gallbladder disease but I suspect he will need to consider surgical intervention. Surgery may be significantly difficult because of his previous surgeries. I discussed this plan so far with the patient and his wife and they are in agreement.

## 2016-04-22 NOTE — Progress Notes (Addendum)
Pt. Attempted to sit at the side of the bed and use the urinal, and slid off the bed unto the floor. Pt. Noted that he did not hit his head and was not hurt but embarrassed.  Vital signs were WDL. The MD and supervisor were notified. Pt did not want his family informed at this time and noted this is because  he is embarrassed.

## 2016-04-22 NOTE — Progress Notes (Signed)
Echo is still pending. WIll review as soon as it is completed. Should be done later tonight.

## 2016-04-23 ENCOUNTER — Ambulatory Visit: Payer: PPO | Admitting: Internal Medicine

## 2016-04-23 ENCOUNTER — Inpatient Hospital Stay: Payer: PPO | Admitting: Anesthesiology

## 2016-04-23 ENCOUNTER — Encounter
Admission: EM | Disposition: A | Discharge status: Home Health | Payer: Self-pay | Source: Home / Self Care | Attending: Surgery

## 2016-04-23 ENCOUNTER — Encounter: Payer: Self-pay | Admitting: *Deleted

## 2016-04-23 HISTORY — PX: CHOLECYSTECTOMY: SHX55

## 2016-04-23 LAB — GLUCOSE, CAPILLARY
GLUCOSE-CAPILLARY: 149 mg/dL — AB (ref 65–99)
GLUCOSE-CAPILLARY: 147 mg/dL — AB (ref 65–99)
GLUCOSE-CAPILLARY: 201 mg/dL — AB (ref 65–99)
Glucose-Capillary: 150 mg/dL — ABNORMAL HIGH (ref 65–99)
Glucose-Capillary: 161 mg/dL — ABNORMAL HIGH (ref 65–99)
Glucose-Capillary: 166 mg/dL — ABNORMAL HIGH (ref 65–99)
Glucose-Capillary: 171 mg/dL — ABNORMAL HIGH (ref 65–99)
Glucose-Capillary: 181 mg/dL — ABNORMAL HIGH (ref 65–99)
Glucose-Capillary: 187 mg/dL — ABNORMAL HIGH (ref 65–99)

## 2016-04-23 LAB — SURGICAL PCR SCREEN
MRSA, PCR: NEGATIVE
STAPHYLOCOCCUS AUREUS: NEGATIVE

## 2016-04-23 LAB — ECHOCARDIOGRAM COMPLETE
HEIGHTINCHES: 74 in
Weight: 4027.2 oz

## 2016-04-23 LAB — CREATININE, SERUM
CREATININE: 2.99 mg/dL — AB (ref 0.61–1.24)
GFR calc non Af Amer: 19 mL/min — ABNORMAL LOW (ref >60)
GFR, EST AFRICAN AMERICAN: 22 mL/min — AB (ref >60)

## 2016-04-23 SURGERY — LAPAROSCOPIC CHOLECYSTECTOMY
Anesthesia: General | Wound class: Clean Contaminated

## 2016-04-23 MED ORDER — MIDAZOLAM HCL 2 MG/2ML IJ SOLN
INTRAMUSCULAR | Status: DC | PRN
  Administered 2016-04-23: 2 mg via INTRAVENOUS

## 2016-04-23 MED ORDER — BUPIVACAINE HCL (PF) 0.25 % IJ SOLN
INTRAMUSCULAR | Status: AC
  Filled 2016-04-23: qty 30

## 2016-04-23 MED ORDER — LIDOCAINE HCL (CARDIAC) 20 MG/ML IV SOLN
INTRAVENOUS | Status: DC | PRN
  Administered 2016-04-23: 40 mg via INTRAVENOUS

## 2016-04-23 MED ORDER — ACETAMINOPHEN 325 MG PO TABS: 650.0000 mg | ORAL_TABLET | Freq: Four times a day (QID) | ORAL | Status: DC | PRN

## 2016-04-23 MED ORDER — GLYCOPYRROLATE 0.2 MG/ML IJ SOLN
INTRAMUSCULAR | Status: DC | PRN
  Administered 2016-04-23: .6 mg via INTRAVENOUS

## 2016-04-23 MED ORDER — HYDROMORPHONE HCL 1 MG/ML IJ SOLN
1.0000 mg | INTRAMUSCULAR | Status: DC | PRN
  Administered 2016-04-23: 1 mg via INTRAVENOUS
  Filled 2016-04-23: qty 1

## 2016-04-23 MED ORDER — LEVOTHYROXINE SODIUM 200 MCG PO TABS
300.0000 ug | ORAL_TABLET | Freq: Every day | ORAL | Status: DC
  Administered 2016-04-24 – 2016-04-26 (×3): 300 ug via ORAL
  Filled 2016-04-23 (×3): qty 1

## 2016-04-23 MED ORDER — SIMVASTATIN 20 MG PO TABS
20.0000 mg | ORAL_TABLET | Freq: Every day | ORAL | Status: DC
  Administered 2016-04-23 – 2016-04-25 (×3): 20 mg via ORAL
  Filled 2016-04-23 (×3): qty 1

## 2016-04-23 MED ORDER — ESMOLOL HCL 100 MG/10ML IV SOLN
INTRAVENOUS | Status: DC | PRN
  Administered 2016-04-23: 10 mg via INTRAVENOUS
  Administered 2016-04-23: 20 mg via INTRAVENOUS

## 2016-04-23 MED ORDER — ACETAMINOPHEN 650 MG RE SUPP: 650.0000 mg | Freq: Four times a day (QID) | RECTAL | Status: DC | PRN

## 2016-04-23 MED ORDER — ATENOLOL 25 MG PO TABS
25.0000 mg | ORAL_TABLET | Freq: Every day | ORAL | Status: DC
  Administered 2016-04-23 – 2016-04-24 (×2): 25 mg via ORAL
  Filled 2016-04-23 (×2): qty 1

## 2016-04-23 MED ORDER — FENTANYL CITRATE (PF) 100 MCG/2ML IJ SOLN
INTRAMUSCULAR | Status: DC | PRN
  Administered 2016-04-23: 50 ug via INTRAVENOUS
  Administered 2016-04-23 (×2): 100 ug via INTRAVENOUS
  Administered 2016-04-23 (×3): 50 ug via INTRAVENOUS

## 2016-04-23 MED ORDER — FENTANYL CITRATE (PF) 100 MCG/2ML IJ SOLN: 25.0000 ug | INTRAMUSCULAR | Status: DC | PRN

## 2016-04-23 MED ORDER — HYDROCODONE-ACETAMINOPHEN 5-325 MG PO TABS
2.0000 | ORAL_TABLET | Freq: Four times a day (QID) | ORAL | Status: DC | PRN
  Administered 2016-04-23: 2 via ORAL
  Filled 2016-04-23: qty 2

## 2016-04-23 MED ORDER — PROPOFOL 10 MG/ML IV BOLUS
INTRAVENOUS | Status: DC | PRN
  Administered 2016-04-23: 150 mg via INTRAVENOUS

## 2016-04-23 MED ORDER — ONDANSETRON HCL 4 MG/2ML IJ SOLN: 4.0000 mg | Freq: Once | INTRAMUSCULAR | Status: DC | PRN

## 2016-04-23 MED ORDER — HEPARIN SODIUM (PORCINE) 5000 UNIT/ML IJ SOLN
INTRAMUSCULAR | Status: AC
  Filled 2016-04-23: qty 1

## 2016-04-23 MED ORDER — NEOSTIGMINE METHYLSULFATE 10 MG/10ML IV SOLN
INTRAVENOUS | Status: DC | PRN
  Administered 2016-04-23: 5 mg via INTRAVENOUS

## 2016-04-23 MED ORDER — LABETALOL HCL 5 MG/ML IV SOLN
INTRAVENOUS | Status: DC | PRN
  Administered 2016-04-23: 5 mg via INTRAVENOUS
  Administered 2016-04-23: 2.5 mg via INTRAVENOUS

## 2016-04-23 MED ORDER — ENOXAPARIN SODIUM 40 MG/0.4ML ~~LOC~~ SOLN: 40.0000 mg | SUBCUTANEOUS | Status: DC

## 2016-04-23 MED ORDER — ROCURONIUM BROMIDE 100 MG/10ML IV SOLN
INTRAVENOUS | Status: DC | PRN
  Administered 2016-04-23 (×2): 10 mg via INTRAVENOUS
  Administered 2016-04-23: 45 mg via INTRAVENOUS

## 2016-04-23 MED ORDER — EPHEDRINE SULFATE 50 MG/ML IJ SOLN
INTRAMUSCULAR | Status: DC | PRN
  Administered 2016-04-23: 5 mg via INTRAVENOUS

## 2016-04-23 SURGICAL SUPPLY — 74 items
APPLIER CLIP ROT 10 11.4 M/L (STAPLE) ×2
BAG BILE T-TUBES STRL (MISCELLANEOUS) IMPLANT
BAG COUNTER SPONGE EZ (MISCELLANEOUS) ×2 IMPLANT
BAG URO DRAIN 2000ML W/SPOUT (MISCELLANEOUS) IMPLANT
BLADE SURG 15 STRL LF DISP TIS (BLADE) ×1 IMPLANT
BLADE SURG 15 STRL SS (BLADE) ×1
BULB RESERV EVAC DRAIN JP 100C (MISCELLANEOUS) ×2 IMPLANT
CANISTER SUCT 1200ML W/VALVE (MISCELLANEOUS) ×2 IMPLANT
CATH FOLEY 2WAY  5CC 16FR (CATHETERS)
CATH REDDICK CHOLANGI 4FR 50CM (CATHETERS) IMPLANT
CATH URTH 16FR FL 2W BLN LF (CATHETERS) IMPLANT
CHLORAPREP W/TINT 26ML (MISCELLANEOUS) ×2 IMPLANT
CLIP APPLIE ROT 10 11.4 M/L (STAPLE) ×1 IMPLANT
CLIP TI LARGE 6 (CLIP) IMPLANT
CLIP TI MEDIUM 6 (CLIP) IMPLANT
CONRAY 60ML FOR OR (MISCELLANEOUS) IMPLANT
CUTTER FLEX LINEAR 45M (STAPLE) ×2 IMPLANT
DRAIN CHANNEL JP 19F (MISCELLANEOUS) ×2 IMPLANT
DRAPE LAPAROTOMY TRNSV 106X77 (MISCELLANEOUS) IMPLANT
DRAPE SHEET LG 3/4 BI-LAMINATE (DRAPES) ×2 IMPLANT
DRSG TEGADERM 2-3/8X2-3/4 SM (GAUZE/BANDAGES/DRESSINGS) ×10 IMPLANT
DRSG TELFA 3X8 NADH (GAUZE/BANDAGES/DRESSINGS) ×2 IMPLANT
ELECT CAUTERY NEEDLE TIP 1.0 (MISCELLANEOUS)
ELECT REM PT RETURN 9FT ADLT (ELECTROSURGICAL) ×2
ELECTRODE CAUTERY NEDL TIP 1.0 (MISCELLANEOUS) IMPLANT
ELECTRODE REM PT RTRN 9FT ADLT (ELECTROSURGICAL) ×1 IMPLANT
GAUZE SPONGE 4X4 12PLY STRL (GAUZE/BANDAGES/DRESSINGS) IMPLANT
GLOVE BIO SURGEON STRL SZ7.5 (GLOVE) ×6 IMPLANT
GLOVE INDICATOR 8.0 STRL GRN (GLOVE) ×6 IMPLANT
GOWN STRL REUS W/ TWL LRG LVL3 (GOWN DISPOSABLE) ×3 IMPLANT
GOWN STRL REUS W/TWL LRG LVL3 (GOWN DISPOSABLE) ×3
GRASPER SUT TROCAR 14GX15 (MISCELLANEOUS) ×2 IMPLANT
IRRIGATION STRYKERFLOW (MISCELLANEOUS) ×1 IMPLANT
IRRIGATOR STRYKERFLOW (MISCELLANEOUS) ×2
IV NS 1000ML (IV SOLUTION) ×1
IV NS 1000ML BAXH (IV SOLUTION) ×1 IMPLANT
JACKSON PRATT 10 (INSTRUMENTS) IMPLANT
KIT RM TURNOVER STRD PROC AR (KITS) ×2 IMPLANT
LABEL OR SOLS (LABEL) ×2 IMPLANT
NDL SAFETY 18GX1.5 (NEEDLE) ×2 IMPLANT
NEEDLE HYPO 25X1 1.5 SAFETY (NEEDLE) ×2 IMPLANT
NEEDLE INSUFFLATION 14GA 120MM (NEEDLE) ×2 IMPLANT
NS IRRIG 1000ML POUR BTL (IV SOLUTION) ×2 IMPLANT
NS IRRIG 500ML POUR BTL (IV SOLUTION) ×2 IMPLANT
PACK BASIN MAJOR ARMC (MISCELLANEOUS) ×2 IMPLANT
PACK LAP CHOLECYSTECTOMY (MISCELLANEOUS) ×2 IMPLANT
POUCH ENDO CATCH 10MM SPEC (MISCELLANEOUS) ×2 IMPLANT
RELOAD STAPLE TA45 3.5 REG BLU (ENDOMECHANICALS) ×2 IMPLANT
SCISSORS METZENBAUM CVD 33 (INSTRUMENTS) ×2 IMPLANT
SEAL FOR SCOPE WARMER C3101 (MISCELLANEOUS) IMPLANT
SLEEVE ADV FIXATION 5X100MM (TROCAR) ×2 IMPLANT
SPONGE DRAIN TRACH 4X4 STRL 2S (GAUZE/BANDAGES/DRESSINGS) ×2 IMPLANT
SPONGE KITTNER 5P (MISCELLANEOUS) IMPLANT
SPONGE LAP 18X18 5 PK (GAUZE/BANDAGES/DRESSINGS) ×2 IMPLANT
SPONGE XRAY 4X4 16PLY STRL (MISCELLANEOUS) ×2 IMPLANT
STAPLER SKIN PROX 35W (STAPLE) IMPLANT
SUT ETHILON 3-0 FS-10 30 BLK (SUTURE)
SUT ETHILON 5-0 FS-2 18 BLK (SUTURE) ×2 IMPLANT
SUT MAXON ABS #0 GS21 30IN (SUTURE) IMPLANT
SUT PDS AB 0 CT1 27 (SUTURE) IMPLANT
SUT SILK 2 0 (SUTURE)
SUT SILK 2-0 30XBRD TIE 12 (SUTURE) IMPLANT
SUT VIC AB 0 CT2 27 (SUTURE) ×2 IMPLANT
SUTURE EHLN 3-0 FS-10 30 BLK (SUTURE) IMPLANT
SYR 20CC LL (SYRINGE) IMPLANT
SYR 3ML LL SCALE MARK (SYRINGE) IMPLANT
SYR 5ML LL (SYRINGE) ×2 IMPLANT
TROCAR Z-THREAD FIOS 11X100 BL (TROCAR) ×2 IMPLANT
TROCAR Z-THREAD OPTICAL 5X100M (TROCAR) ×2 IMPLANT
TROCAR Z-THREAD SLEEVE 11X100 (TROCAR) ×2 IMPLANT
TUBE T CATTELL RUB 12F (TUBING) IMPLANT
TUBE T CATTELL RUB 14F (TUBING) IMPLANT
TUBING INSUFFLATOR HI FLOW (MISCELLANEOUS) ×2 IMPLANT
WATER STERILE IRR 1000ML POUR (IV SOLUTION) ×2 IMPLANT

## 2016-04-23 NOTE — Care Management Important Message (Signed)
Important Message  Patient Details  Name: [name] MRN: [id number] Date of Birth: Sep 28, 1941   Medicare Important Message Given:  Yes    [name] 04/23/2016, 2:25 PM

## 2016-04-23 NOTE — Anesthesia Procedure Notes (Signed)
Procedure Name: Intubation Date/Time: 04/23/2016 10:00 AM Performed by: Allean Found Pre-anesthesia Checklist: Patient identified, Emergency Drugs available, Suction available, Patient being monitored and Timeout performed Patient Re-evaluated:Patient Re-evaluated prior to inductionOxygen Delivery Method: Circle system utilized Preoxygenation: Pre-oxygenation with 100% oxygen Intubation Type: IV induction Ventilation: Mask ventilation without difficulty Laryngoscope Size: Mac and 4 Grade View: Grade II Tube type: Oral Tube size: 7.0 mm Number of attempts: 1 Airway Equipment and Method: Stylet Placement Confirmation: ETT inserted through vocal cords under direct vision,  positive ETCO2 and breath sounds checked- equal and bilateral Secured at: 22 cm Tube secured with: Tape Dental Injury: Teeth and Oropharynx as per pre-operative assessment  Difficulty Due To: Difficulty was unanticipated Comments: Dr Andree Elk intubated 1/1

## 2016-04-23 NOTE — Progress Notes (Signed)
[place]  SUBJECTIVE:No chest pain or sob. Abdominal discomfort  Filed Vitals:   04/22/16 0204 04/22/16 0340 04/22/16 2113 04/23/16 0422  BP: 130/88 131/72 122/74 139/79  Pulse: 99 83 67 57  Temp: 98.1 F (36.7 C) 98.6 F (37 C) 97.8 F (36.6 C) 97.7 F (36.5 C)  TempSrc: Oral Oral Oral Oral  Resp: 20 22  18   Height:      Weight:      SpO2: 96% 92% 95% 94%    Intake/Output Summary (Last 24 hours) at 04/23/16 0743 Last data filed at 04/23/16 0441  Gross per 24 hour  Intake   2968 ml  Output   1800 ml  Net   1168 ml    LABS: Basic Metabolic Panel:  Recent Labs  04/21/16 0520 04/22/16 0413 04/23/16 0551  NA 129* 131*  --   K 5.6* 4.4  --   CL 102 104  --   CO2 20* 20*  --   GLUCOSE 336* 190*  --   BUN 34* 38*  --   CREATININE 2.53* 3.00* 2.99*  CALCIUM 8.1* 8.1*  --    Liver Function Tests:  Recent Labs  04/21/16 0520 04/22/16 0413  AST 36 40  ALT 26 26  ALKPHOS 100 115  BILITOT 1.6* 1.9*  PROT 6.5 5.7*  ALBUMIN 2.8* 2.5*    Recent Labs  04/20/16 1833  LIPASE 16   CBC:  Recent Labs  04/20/16 1833 04/21/16 0520 04/22/16 0413  WBC 16.3* 18.5* 13.9*  NEUTROABS 12.6*  --  11.4*  HGB 16.1 14.9 13.9  HCT 48.1 45.1 41.0  MCV 92.6 93.4 92.6  PLT 112* 103* 96*   Cardiac Enzymes:  Recent Labs  04/20/16 1833  TROPONINI <0.03   BNP: Invalid input(s): POCBNP D-Dimer: No results for input(s): DDIMER in the last 72 hours. Hemoglobin A1C: No results for input(s): HGBA1C in the last 72 hours. Fasting Lipid Panel: No results for input(s): CHOL, HDL, LDLCALC, TRIG, CHOLHDL, LDLDIRECT in the last 72 hours. Thyroid Function Tests: No results for input(s): TSH, T4TOTAL, T3FREE, THYROIDAB in the last 72 hours.  Invalid input(s): FREET3 Anemia Panel: No results for input(s): VITAMINB12, FOLATE, FERRITIN, TIBC, IRON, RETICCTPCT in the last 72 hours.   Physical Exam: Blood pressure 139/79, pulse 57,  temperature 97.7 F (36.5 C), temperature source Oral, resp. rate 18, height 6\' 2"  (1.88 m), weight 114.17 kg (251 lb 11.2 oz), SpO2 94 %.   Wt Readings from Last 1 Encounters:  04/21/16 114.17 kg (251 lb 11.2 oz)     General appearance: alert and cooperative Resp: clear to auscultation bilaterally Cardio: regular rate and rhythm GI: abnormal findings:  mild tenderness in the RUQ Neurologic: Grossly normal  TELEMETRY: Reviewed telemetry pt in nsr:  ASSESSMENT AND PLAN:  Active Problems:   Cholecystitis, acute  CAD-echo completed which revealed preserved lv function with ef of 55%. Mild tr and trivial mr. No as or significant ai. Pt is at low risk for surgery from cardiac standpoint.     Teodoro Spray., MD, [facility] 04/23/2016 7:43 AM

## 2016-04-23 NOTE — Anesthesia Preprocedure Evaluation (Addendum)
Anesthesia Evaluation  Patient identified by MRN, date of birth, ID band Patient awake    Reviewed: Allergy & Precautions, H&P , NPO status , Patient's Chart, lab work & pertinent test results, reviewed documented beta blocker date and time   Airway Mallampati: II   Neck ROM: full    Dental  (+) Poor Dentition, Teeth Intact   Pulmonary neg pulmonary ROS, former smoker,    Pulmonary exam normal        Cardiovascular hypertension, + CAD and + CABG  negative cardio ROS Normal cardiovascular exam Rhythm:regular Rate:Normal     Neuro/Psych  Neuromuscular disease negative neurological ROS  negative psych ROS   GI/Hepatic negative GI ROS, Neg liver ROS,   Endo/Other  negative endocrine ROSdiabetesHypothyroidism   Renal/GU CRFRenal diseasenegative Renal ROS  negative genitourinary   Musculoskeletal   Abdominal   Peds  Hematology negative hematology ROS (+)   Anesthesia Other Findings Past Medical History:   Hypertension                                                 Hyperlipidemia                                               Thyroid disease                                              Diabetes mellitus without complication ([place])                 Cancer ([place])                                                 Coronary artery disease                                    Past Surgical History:   PROSTATE SURGERY                                 2002           Comment:BPH benign pathology   CORONARY ARTERY BYPASS GRAFT                     1997           Comment:x [place]           Comment:Lumbar disc   COLON SURGERY                                    2011  Comment:Colectomy for ileo-cecal valve cancer   APPENDECTOMY                                     2011         HERNIA REPAIR                                    2011           Comment:Ventral hernia   KNEE SURGERY                                     Left            BMI    Body Mass Index   32.30 kg/m 2     Reproductive/Obstetrics                            Anesthesia Physical Anesthesia Plan  ASA: III  Anesthesia Plan: General and General ETT   Post-op Pain Management:    Induction:   Airway Management Planned:   Additional Equipment:   Intra-op Plan:   Post-operative Plan:   Informed Consent: I have reviewed the patients History and Physical, chart, labs and discussed the procedure including the risks, benefits and alternatives for the proposed anesthesia with the patient or authorized representative who has indicated his/her understanding and acceptance.   Dental Advisory Given  Plan Discussed with: CRNA  Anesthesia Plan Comments: (Echo from yesterday reveals ef 55%.. Cardiology states low risk.  JA)       Anesthesia Quick Evaluation

## 2016-04-23 NOTE — Transfer of Care (Signed)
Immediate Anesthesia Transfer of Care Note  Patient: [name]  Procedure(s) Performed: Procedure(s): LAPAROSCOPIC CHOLECYSTECTOMY (N/A)  Patient Location: PACU  Anesthesia Type:General  Level of Consciousness: sedated and responds to stimulation  Airway & Oxygen Therapy: Patient Spontanous Breathing and Patient connected to face mask oxygen  Post-op Assessment: Report given to RN and Post -op Vital signs reviewed and stable  Post vital signs: Reviewed and stable  Last Vitals:  Filed Vitals:   04/23/16 1209 04/23/16 1213  BP: 157/82 157/82  Pulse: 90 77  Temp: 36.7 C   Resp: 21 19    Last Pain:  Filed Vitals:   04/23/16 1213  PainSc: 3       Patients Stated Pain Goal: 0 ([id number])  Complications: No apparent anesthesia complications

## 2016-04-23 NOTE — Op Note (Signed)
04/20/2016 - 04/23/2016  12:05 PM  PATIENT:  [name]  75 y.o. male  PRE-OPERATIVE DIAGNOSIS:  N/A  POST-OPERATIVE DIAGNOSIS:  cholecystitis  PROCEDURE:  Procedure(s): LAPAROSCOPIC CHOLECYSTECTOMY (N/A)  SURGEON:  Surgeon(s) and Role:    * [name], MD - Primary   ASSISTANTS: none   ANESTHESIA:   general  EBL:  Total I/O In: 1155 [I.V.:1155] Out: 275 [Urine:275]   DRAINS: (1) Jackson-Pratt drain(s) with closed bulb suction in the subhepatic space   LOCAL MEDICATIONS USED:  MARCAINE      DISPOSITION OF SPECIMEN:  PATHOLOGY   DICTATION: .Dragon Dictation with the patient supine position and after induction of appropriate general anesthesia the patient's abdomen was prepped with ChloraPrep and draped sterile towels. The patient was placed headdown feet up position. A small left abdominal incision was made because of the patient's previous midline surgery. Using the Visiport apparatus the abdomen was cannulated on a single pass under direct vision. CO2 was then insufflated. There were multiple adhesions at the midline. The left and right lower quadrants appeared to be uninvolved. The omentum and right colon were adherent to the dome of the gallbladder and edge of the liver. A right mid abdomen decision was performed and the 11 mm port inserted under direct vision. Subxiphoid transverse incision was made 11 mm port inserted under direct vision. 2 lateral ports 5 mm in size were inserted under direct vision. The patient's place head up feet down position rotated slightly to the left side.  The omentum and colon were gently peeled off the liver edge. The gallbladder was markedly distended and appeared to be acutely inflamed and gangrenous. It was aspirated of about 50 cc of dark colored bile. This dissection was required to expose any of the hepatoduodenal ligament. I was able to identify the cystic artery which was doubly clipped and divided. However I could not adequately did  identify the cystic duct. Inflammatory change and gangrenous changes were so intense in that area that I could not safely dissected to the base of the gallbladder.  I switched the subxiphoid port to a 12 mm port. Using a Endo GIA stapling device carrying a blue load the distal portion of the gallbladder was amputated. The gallbladder was then removed with a combination of blunt and Bovie dissection. It was captured Endo Catch apparatus and taken out of the abdomen through the subxiphoid incision. The area was then copiously suction irrigated with several liters warm saline solution. Careful examination of the gallbladder bed did not show any evidence of bleeding. A midline incision was closed with 2 figure-of-eight sutures 0 Vicryl using the suture passer under direct vision. A 19 [place] Blake drain was inserted through one of the stab wounds and brought out through the right upper quadrant placing a drain in the bed of the liver. The drain was secured with 3-0 nylon. The abdomen was then desufflated. All ports withdrawn without difficulty. Skin incisions were closed with 5-0 nylon. The area was infiltrated with 0.25% Marcaine for postoperative pain control. Sterile dressings were applied. The patient was returned recovery room having tolerated procedure well. Sponge instrument and needle count were correct 2 in the operating room.  PLAN OF CARE: Admit to inpatient   PATIENT DISPOSITION:  PACU - hemodynamically stable.   [name], MD

## 2016-04-23 NOTE — H&P (View-Only) (Signed)
[place]  SUBJECTIVE:No chest pain or sob. Abdominal discomfort  Filed Vitals:   04/22/16 0204 04/22/16 0340 04/22/16 2113 04/23/16 0422  BP: 130/88 131/72 122/74 139/79  Pulse: 99 83 67 57  Temp: 98.1 F (36.7 C) 98.6 F (37 C) 97.8 F (36.6 C) 97.7 F (36.5 C)  TempSrc: Oral Oral Oral Oral  Resp: 20 22  18   Height:      Weight:      SpO2: 96% 92% 95% 94%    Intake/Output Summary (Last 24 hours) at 04/23/16 0743 Last data filed at 04/23/16 0441  Gross per 24 hour  Intake   2968 ml  Output   1800 ml  Net   1168 ml    LABS: Basic Metabolic Panel:  Recent Labs  04/21/16 0520 04/22/16 0413 04/23/16 0551  NA 129* 131*  --   K 5.6* 4.4  --   CL 102 104  --   CO2 20* 20*  --   GLUCOSE 336* 190*  --   BUN 34* 38*  --   CREATININE 2.53* 3.00* 2.99*  CALCIUM 8.1* 8.1*  --    Liver Function Tests:  Recent Labs  04/21/16 0520 04/22/16 0413  AST 36 40  ALT 26 26  ALKPHOS 100 115  BILITOT 1.6* 1.9*  PROT 6.5 5.7*  ALBUMIN 2.8* 2.5*    Recent Labs  04/20/16 1833  LIPASE 16   CBC:  Recent Labs  04/20/16 1833 04/21/16 0520 04/22/16 0413  WBC 16.3* 18.5* 13.9*  NEUTROABS 12.6*  --  11.4*  HGB 16.1 14.9 13.9  HCT 48.1 45.1 41.0  MCV 92.6 93.4 92.6  PLT 112* 103* 96*   Cardiac Enzymes:  Recent Labs  04/20/16 1833  TROPONINI <0.03   BNP: Invalid input(s): POCBNP D-Dimer: No results for input(s): DDIMER in the last 72 hours. Hemoglobin A1C: No results for input(s): HGBA1C in the last 72 hours. Fasting Lipid Panel: No results for input(s): CHOL, HDL, LDLCALC, TRIG, CHOLHDL, LDLDIRECT in the last 72 hours. Thyroid Function Tests: No results for input(s): TSH, T4TOTAL, T3FREE, THYROIDAB in the last 72 hours.  Invalid input(s): FREET3 Anemia Panel: No results for input(s): VITAMINB12, FOLATE, FERRITIN, TIBC, IRON, RETICCTPCT in the last 72 hours.   Physical Exam: Blood pressure 139/79, pulse 57,  temperature 97.7 F (36.5 C), temperature source Oral, resp. rate 18, height 6\' 2"  (1.88 m), weight 114.17 kg (251 lb 11.2 oz), SpO2 94 %.   Wt Readings from Last 1 Encounters:  04/21/16 114.17 kg (251 lb 11.2 oz)     General appearance: alert and cooperative Resp: clear to auscultation bilaterally Cardio: regular rate and rhythm GI: abnormal findings:  mild tenderness in the RUQ Neurologic: Grossly normal  TELEMETRY: Reviewed telemetry pt in nsr:  ASSESSMENT AND PLAN:  Active Problems:   Cholecystitis, acute  CAD-echo completed which revealed preserved lv function with ef of 55%. Mild tr and trivial mr. No as or significant ai. Pt is at low risk for surgery from cardiac standpoint.     Teodoro Spray., MD, [facility] 04/23/2016 7:43 AM

## 2016-04-23 NOTE — Interval H&P Note (Signed)
History and Physical Interval Note:  04/23/2016 8:15 AM  [name]  has presented today for surgery, with the diagnosis of N/A  The various methods of treatment have been discussed with the patient and family. After consideration of risks, benefits and other options for treatment, the patient has consented to  Procedure(s): LAPAROSCOPIC CHOLECYSTECTOMY (N/A) CHOLECYSTECTOMY / POSSIBLE (N/A) as a surgical intervention .  The patient's history has been reviewed, patient examined, no change in status, stable for surgery.  I have reviewed the patient's chart and labs.  Questions were answered to the patient's satisfaction.     [name]

## 2016-04-23 NOTE — Progress Notes (Signed)

## 2016-04-23 NOTE — Progress Notes (Signed)
Per Dr. Pat Patrick okay to give lovenox and discontinue one of the orders as it was a duplicate

## 2016-04-23 NOTE — Progress Notes (Signed)
[place] [facility] [place] NAME: [name]    MR#:  [id number]  DATE OF BIRTH:  10-Nov-1940  SUBJECTIVE:  CHIEF COMPLAINT:   Chief Complaint  Patient presents with  . Abdominal Pain   Found to have Acute cholecystitis- on surgical service with IV Abx. Dehydration with some worsening renal func.  have Hx of CAD- medical consult for pre-op eval.   Status post laparoscopic cholecystectomy 04/23/16.   Somewhat drowsy but arousable after surgery today.  REVIEW OF SYSTEMS:  CONSTITUTIONAL: No fever, fatigue or weakness.  EYES: No blurred or double vision.  EARS, NOSE, AND THROAT: No tinnitus or ear pain.  RESPIRATORY: No cough, shortness of breath, wheezing or hemoptysis.  CARDIOVASCULAR: No chest pain, orthopnea, edema.  GASTROINTESTINAL: some nausea,no vomiting, diarrhea , Right upper quadrant abdominal pain.  GENITOURINARY: No dysuria, hematuria.  ENDOCRINE: No polyuria, nocturia,  HEMATOLOGY: No anemia, easy bruising or bleeding SKIN: No rash or lesion. MUSCULOSKELETAL: No joint pain or arthritis.   NEUROLOGIC: No tingling, numbness, weakness.  PSYCHIATRY: No anxiety or depression.   ROS  DRUG ALLERGIES:   Allergies  Allergen Reactions  . Ace Inhibitors Other (See Comments)    Reaction:  Raises potassium     VITALS:  Blood pressure 136/71, pulse 74, temperature 98.2 F (36.8 C), temperature source Oral, resp. rate 18, height 6\' 2"  (1.88 m), weight 114.17 kg (251 lb 11.2 oz), SpO2 94 %.  PHYSICAL EXAMINATION:  GENERAL:  75 y.o.-year-old patient lying in the bed with no acute distress.  EYES: Pupils equal, round, reactive to light and accommodation. No scleral icterus. Extraocular muscles intact.  HEENT: Head atraumatic, normocephalic. Oropharynx and nasopharynx clear.  NECK:  Supple, no jugular venous distention. No thyroid enlargement, no tenderness.  LUNGS: Normal breath sounds bilaterally, no wheezing, rales,rhonchi or  crepitation. No use of accessory muscles of respiration.  CARDIOVASCULAR: S1, S2 normal. No murmurs, rubs, or gallops.  ABDOMEN: Soft, Right upper quadrant tenderness, nondistended. Bowel sounds present. No organomegaly or mass.  EXTREMITIES: No pedal edema, cyanosis, or clubbing.  NEUROLOGIC: Cranial nerves II through XII are intact. Muscle strength 5/5 in all extremities. Sensation intact. Gait not checked.  PSYCHIATRIC: The patient is slightly drowsy but oriented  SKIN: No obvious rash, lesion, or ulcer.   Physical Exam LABORATORY PANEL:   CBC  Recent Labs Lab 04/22/16 0413  WBC 13.9*  HGB 13.9  HCT 41.0  PLT 96*   ------------------------------------------------------------------------------------------------------------------  Chemistries   Recent Labs Lab 04/22/16 0413 04/23/16 0551  NA 131*  --   K 4.4  --   CL 104  --   CO2 20*  --   GLUCOSE 190*  --   BUN 38*  --   CREATININE 3.00* 2.99*  CALCIUM 8.1*  --   AST 40  --   ALT 26  --   ALKPHOS 115  --   BILITOT 1.9*  --    ------------------------------------------------------------------------------------------------------------------  Cardiac Enzymes  Recent Labs Lab 04/19/16 1208 04/20/16 1833  TROPONINI <0.03 <0.03   ------------------------------------------------------------------------------------------------------------------  RADIOLOGY:  Nm Hepato W/eject Fract  04/22/2016  CLINICAL DATA:  Acute cholecystitis by ultrasound and CT. EXAM: NUCLEAR MEDICINE HEPATOBILIARY IMAGING TECHNIQUE: Sequential images of the abdomen were obtained out to 60 minutes following intravenous administration of radiopharmaceutical. RADIOPHARMACEUTICALS:  5.3 mCi Tc-73m  Choletec IV COMPARISON:  04/21/2016 CT and ultrasound FINDINGS: Normal hepatic uptake and biliary excretion of the radiopharmaceutical. Activity progresses throughout the small bowel. There is reflux  of activity into the duodenum and stomach.  Gallbladder is not visualized at approximately 1 hour. 3 mg morphine administered. Despite this and additional delayed imaging out to 4 hours, the gallbladder is not demonstrated. This is compatible with cystic duct obstruction and cholecystitis. IMPRESSION: Nonvisualization of the gallbladder despite administered morphine and delayed imaging compatible with cystic duct obstruction secondary to cholecystitis. No biliary duct obstruction or small bowel obstruction. These results will be called to the ordering clinician or representative by the Radiologist Assistant, and communication documented in the PACS or zVision Dashboard. Electronically Signed   By: Jerilynn Mages.  Shick M.D.   On: 04/22/2016 14:44    ASSESSMENT AND PLAN:   Active Problems:   Cholecystitis, acute  * Acute cholecystitis   Confirmed by CT scan, ultrasound and HIDA scan.   S/p laproscopic cholecystectomy 04/23/16.   Manage per surgery.  * Acute on chronic renal failure   Worsening in renal function because of poor oral intake due to pain and cholecystitis for last 2-3 days.   IV fluid and continue monitoring.  * Diabetes   On long-acting insulin sliding scale coverage in hospital.  * Coronary artery disease   Resume aspirin and statin when allowed after surgery.   Appreciated help by cardiology.  * Hypertension   Blood pressure is stable, continue monitoring resume atenolol.  * Hyperlipidemia   Continue statin.  All the records are reviewed and case discussed with Care Management/Social Workerr. Management plans discussed with the patient, family and they are in agreement.  CODE STATUS: Full code  TOTAL TIME TAKING CARE OF THIS PATIENT: 35 minutes.   POSSIBLE D/C IN 1-2 DAYS, DEPENDING ON CLINICAL CONDITION.   Vaughan Basta M.D on 04/23/2016   Between 7am to 6pm - Pager - [phone]  After 6pm go to [email or web address] - password [email or web address]  Office  [phone]  CC: Primary care  physician; Halina Maidens, MD  Note: This dictation was prepared with Dragon dictation along with smaller phrase technology. Any transcriptional errors that result from this process are unintentional.

## 2016-04-24 LAB — GLUCOSE, CAPILLARY
GLUCOSE-CAPILLARY: 142 mg/dL — AB (ref 65–99)
GLUCOSE-CAPILLARY: 173 mg/dL — AB (ref 65–99)
GLUCOSE-CAPILLARY: 162 mg/dL — AB (ref 65–99)
GLUCOSE-CAPILLARY: 229 mg/dL — AB (ref 65–99)
Glucose-Capillary: 173 mg/dL — ABNORMAL HIGH (ref 65–99)
Glucose-Capillary: 244 mg/dL — ABNORMAL HIGH (ref 65–99)

## 2016-04-24 LAB — COMPREHENSIVE METABOLIC PANEL
ALT: 90 U/L — ABNORMAL HIGH (ref 17–63)
ANION GAP: 8 (ref 5–15)
AST: 127 U/L — ABNORMAL HIGH (ref 15–41)
Albumin: 2.2 g/dL — ABNORMAL LOW (ref 3.5–5.0)
Alkaline Phosphatase: 209 U/L — ABNORMAL HIGH (ref 38–126)
BUN: 36 mg/dL — ABNORMAL HIGH (ref 6–20)
CHLORIDE: 106 mmol/L (ref 101–111)
CO2: 19 mmol/L — AB (ref 22–32)
Calcium: 7.9 mg/dL — ABNORMAL LOW (ref 8.9–10.3)
Creatinine, Ser: 2.86 mg/dL — ABNORMAL HIGH (ref 0.61–1.24)
GFR, EST AFRICAN AMERICAN: 23 mL/min — AB (ref >60)
GFR, EST NON AFRICAN AMERICAN: 20 mL/min — AB (ref >60)
Glucose, Bld: 185 mg/dL — ABNORMAL HIGH (ref 65–99)
Potassium: 4.6 mmol/L (ref 3.5–5.1)
SODIUM: 133 mmol/L — AB (ref 135–145)
Total Bilirubin: 1.4 mg/dL — ABNORMAL HIGH (ref 0.3–1.2)
Total Protein: 5.9 g/dL — ABNORMAL LOW (ref 6.5–8.1)

## 2016-04-24 LAB — CBC
HCT: 39.2 % — ABNORMAL LOW (ref 40.0–52.0)
Hemoglobin: 13.3 g/dL (ref 13.0–18.0)
MCH: 31.3 pg (ref 26.0–34.0)
MCHC: 33.8 g/dL (ref 32.0–36.0)
MCV: 92.3 fL (ref 80.0–100.0)
PLATELETS: 123 10*3/uL — AB (ref 150–440)
RBC: 4.24 MIL/uL — ABNORMAL LOW (ref 4.40–5.90)
RDW: 14.7 % — AB (ref 11.5–14.5)
WBC: 9.4 10*3/uL (ref 3.8–10.6)

## 2016-04-24 LAB — SURGICAL PATHOLOGY

## 2016-04-24 MED ORDER — GUAIFENESIN ER 600 MG PO TB12
600.0000 mg | ORAL_TABLET | Freq: Two times a day (BID) | ORAL | Status: DC
  Administered 2016-04-24 – 2016-04-26 (×5): 600 mg via ORAL
  Filled 2016-04-24 (×5): qty 1

## 2016-04-24 MED ORDER — INSULIN GLARGINE 100 UNIT/ML ~~LOC~~ SOLN
10.0000 [IU] | Freq: Two times a day (BID) | SUBCUTANEOUS | Status: DC
  Administered 2016-04-24 – 2016-04-26 (×5): 10 [IU] via SUBCUTANEOUS
  Filled 2016-04-24 (×7): qty 0.1

## 2016-04-24 MED ORDER — ASPIRIN 81 MG PO CHEW
81.0000 mg | CHEWABLE_TABLET | Freq: Every day | ORAL | Status: DC
  Administered 2016-04-24 – 2016-04-26 (×3): 81 mg via ORAL
  Filled 2016-04-24 (×3): qty 1

## 2016-04-24 MED ORDER — ENSURE ENLIVE PO LIQD
237.0000 mL | Freq: Two times a day (BID) | ORAL | Status: DC
  Administered 2016-04-24 – 2016-04-25 (×3): 237 mL via ORAL

## 2016-04-24 NOTE — Evaluation (Signed)
Physical Therapy Evaluation Patient Details Name: [name] MRN: [id number] DOB: 09-19-1941 Today's Date: 04/24/2016   History of Present Illness  Pt admitted for R upper quadrant epigastric px; Dx with acute cholecystitic, laparoscopic cholecystectomy performed 04/23/16. Medical Hx includes HTN, hyperlipidemia, thyroid disease, DM, CA, and CAD.  Clinical Impression  Pt is a pleasant and cooperative 75 y/o male admitted for abdominal pain with nausea and vomiting. After laparoscopic cholecystectomy pt presents with decreased balance and difficulty ambulating. He requires CGA and cuing with bed mobility and sit-to-stand transfer, although his gross strength is 4+/5. Pt fell from EOB in hospital on 04/22/16. He ambulated 20' on RA with RW and mod assist after a sudden loss of balance with Sp02 at 88% down from 93% in supine on 2L 02. Pt shows good PT potential and is appropriate for skilled therapy at this time due to balance, coordination, endurance, and safe use of DME deficits. Recommend he continue therapy in the home health setting to address deficits.       Follow Up Recommendations Home health PT    Equipment Recommendations  Rolling walker with 5" wheels    Recommendations for Other Services       Precautions / Restrictions Precautions Precautions: Fall Precaution Comments: Pt fell from sitting on EOB on 04/22/16, also experienced loss of balance after 10' ambulation w/ RW on RA after 02 dropped to 88% Restrictions Weight Bearing Restrictions: No      Mobility  Bed Mobility Overal bed mobility: Needs Assistance Bed Mobility: Supine to Sit     Supine to sit: Min guard     General bed mobility comments: Pt's strength is adequate for independent bed mobility, but CGA is necessary to ensure safe technique. CGA in sitting until sitting balance is assessed further  Transfers Overall transfer level: Modified independent Equipment used: Rolling walker (2 wheeled)              General transfer comment: Pt requires cuing for proper hand placement on walker; he wants to pull on the walker rather than push down through handle grips   Ambulation/Gait Ambulation/Gait assistance: Mod assist Ambulation Distance (Feet): 20 Feet Assistive device: Rolling walker (2 wheeled) Gait Pattern/deviations: Step-through pattern;Wide base of support     General Gait Details: Pt requires cuing for safe technique; needed mod assist for los of balance after walking 10'.   Stairs            Wheelchair Mobility    Modified Rankin (Stroke Patients Only)       Balance Overall balance assessment: Needs assistance Sitting-balance support: Single extremity supported Sitting balance-Leahy Scale: Good     Standing balance support: Bilateral upper extremity supported Standing balance-Leahy Scale: Good                               Pertinent Vitals/Pain Pain Assessment: 0-10 Pain Score: 5  Pain Location: incision site Pain Intervention(s): Limited activity within patient's tolerance;Monitored during session    Home Living Family/patient expects to be discharged to:: Private residence Living Arrangements: Spouse/significant other Available Help at Discharge: Family Type of Home: House Home Access: Stairs to enter Entrance Stairs-Rails: Can reach both Entrance Stairs-Number of Steps: 2 (very short stairs at garage door) Home Layout: One level Home Equipment: None      Prior Function Level of Independence: Independent               Hand Dominance  Extremity/Trunk Assessment   Upper Extremity Assessment: Overall WFL for tasks assessed           Lower Extremity Assessment: Overall WFL for tasks assessed (grossly 4+/5)      Cervical / Trunk Assessment: Normal  Communication   Communication: No difficulties  Cognition Arousal/Alertness: Awake/alert Behavior During Therapy: WFL for tasks assessed/performed Overall  Cognitive Status: Within Functional Limits for tasks assessed                      General Comments      Exercises Other Exercises Other Exercises: B LAQ X 10 reps in sitting, B quad sets X 10 reps in supine, B hip abd/add X 10 reps in sitting. All with min assist      Assessment/Plan    PT Assessment Patient needs continued PT services  PT Diagnosis Difficulty walking   PT Problem List Decreased activity tolerance;Decreased balance;Decreased mobility;Decreased coordination;Decreased safety awareness;Decreased knowledge of use of DME  PT Treatment Interventions DME instruction;Gait training;Functional mobility training;Therapeutic activities;Therapeutic exercise;Balance training   PT Goals (Current goals can be found in the Care Plan section) Acute Rehab PT Goals Patient Stated Goal: go home PT Goal Formulation: With patient Time For Goal Achievement: 05/08/16 Potential to Achieve Goals: Good    Frequency Min 2X/week   Barriers to discharge        Co-evaluation               End of Session Equipment Utilized During Treatment: Gait belt Activity Tolerance: Patient limited by fatigue (02 dropped to 88% and symptomatic with ambulation on RA) Patient left: in chair;with call bell/phone within reach;with chair alarm set;with family/visitor present Nurse Communication: Mobility status         Time: 0981-1914 PT Time Calculation (min) (ACUTE ONLY): 25 min   Charges:   PT Evaluation $PT Eval Moderate Complexity: 1 Procedure PT Treatments $Therapeutic Exercise: 8-22 mins   PT G Codes:        Ammarie Matsuura 2016-05-20, 12:55 PM  Cassell Smiles, SPT [phone]

## 2016-04-24 NOTE — Care Management (Signed)
Patient admitted status post S/p laproscopic cholecystectomy 04/23/16. Patient lives at home with his wife.  Patient states that he is independent at basline.  Patient states that he has a cane in the home.  PT has assessed the patient and recommeneded home health PT.  Patient was offered home health agency preference list.  Patient does not have a preference.  Heads up referral made to [facility] with [facility].  Patient will need a RW at discharge.  Patient obtains his medications at [facility] in [place].  Denies any issues obtaining his medication.  PCP Dr. Carolin Coy.  RNCM following for discharge planning.

## 2016-04-24 NOTE — Progress Notes (Signed)
[place] [facility] [place] NAME: [name]    MR#:  [id number]  DATE OF BIRTH:  07/02/1941  SUBJECTIVE:  CHIEF COMPLAINT:   Chief Complaint  Patient presents with  . Abdominal Pain   Found to have Acute cholecystitis- on surgical service with IV Abx. Dehydration with some worsening renal func.  have Hx of CAD- medical consult for pre-op eval.   Status post laparoscopic cholecystectomy 04/23/16.   Much better, sitting in chair- upgraded to regular diet today.  REVIEW OF SYSTEMS:  CONSTITUTIONAL: No fever, positive for fatigue or weakness.  EYES: No blurred or double vision.  EARS, NOSE, AND THROAT: No tinnitus or ear pain.  RESPIRATORY: No cough, shortness of breath, wheezing or hemoptysis.  CARDIOVASCULAR: No chest pain, orthopnea, edema.  GASTROINTESTINAL: some nausea,no vomiting, diarrhea , Right upper quadrant abdominal pain.  GENITOURINARY: No dysuria, hematuria.  ENDOCRINE: No polyuria, nocturia,  HEMATOLOGY: No anemia, easy bruising or bleeding SKIN: No rash or lesion. MUSCULOSKELETAL: No joint pain or arthritis.   NEUROLOGIC: No tingling, numbness, weakness.  PSYCHIATRY: No anxiety or depression.   ROS  DRUG ALLERGIES:   Allergies  Allergen Reactions  . Ace Inhibitors Other (See Comments)    Reaction:  Raises potassium     VITALS:  Blood pressure 149/91, pulse 75, temperature 97.8 F (36.6 C), temperature source Oral, resp. rate 18, height 6\' 2"  (1.88 m), weight 114.17 kg (251 lb 11.2 oz), SpO2 89 %.  PHYSICAL EXAMINATION:  GENERAL:  75 y.o.-year-old patient lying in the bed with no acute distress.  EYES: Pupils equal, round, reactive to light and accommodation. No scleral icterus. Extraocular muscles intact.  HEENT: Head atraumatic, normocephalic. Oropharynx and nasopharynx clear.  NECK:  Supple, no jugular venous distention. No thyroid enlargement, no tenderness.  LUNGS: Normal breath sounds bilaterally, no wheezing,  rales,rhonchi or crepitation. No use of accessory muscles of respiration.  CARDIOVASCULAR: S1, S2 normal. No murmurs, rubs, or gallops.  ABDOMEN: Soft, Right upper quadrant tenderness, nondistended. Bowel sounds present. No organomegaly or mass. Drainage tube present in RUq. EXTREMITIES: No pedal edema, cyanosis, or clubbing.  NEUROLOGIC: Cranial nerves II through XII are intact. Muscle strength 5/5 in all extremities. Sensation intact. Gait not checked.  PSYCHIATRIC: The patient is slightly drowsy but oriented  SKIN: No obvious rash, lesion, or ulcer.   Physical Exam LABORATORY PANEL:   CBC  Recent Labs Lab 04/24/16 0441  WBC 9.4  HGB 13.3  HCT 39.2*  PLT 123*   ------------------------------------------------------------------------------------------------------------------  Chemistries   Recent Labs Lab 04/24/16 0441  NA 133*  K 4.6  CL 106  CO2 19*  GLUCOSE 185*  BUN 36*  CREATININE 2.86*  CALCIUM 7.9*  AST 127*  ALT 90*  ALKPHOS 209*  BILITOT 1.4*   ------------------------------------------------------------------------------------------------------------------  Cardiac Enzymes  Recent Labs Lab 04/19/16 1208 04/20/16 1833  TROPONINI <0.03 <0.03   ------------------------------------------------------------------------------------------------------------------  RADIOLOGY:  Nm Hepato W/eject Fract  04/22/2016  CLINICAL DATA:  Acute cholecystitis by ultrasound and CT. EXAM: NUCLEAR MEDICINE HEPATOBILIARY IMAGING TECHNIQUE: Sequential images of the abdomen were obtained out to 60 minutes following intravenous administration of radiopharmaceutical. RADIOPHARMACEUTICALS:  5.3 mCi Tc-34m  Choletec IV COMPARISON:  04/21/2016 CT and ultrasound FINDINGS: Normal hepatic uptake and biliary excretion of the radiopharmaceutical. Activity progresses throughout the small bowel. There is reflux of activity into the duodenum and stomach. Gallbladder is not visualized at  approximately 1 hour. 3 mg morphine administered. Despite this and additional delayed imaging out  to 4 hours, the gallbladder is not demonstrated. This is compatible with cystic duct obstruction and cholecystitis. IMPRESSION: Nonvisualization of the gallbladder despite administered morphine and delayed imaging compatible with cystic duct obstruction secondary to cholecystitis. No biliary duct obstruction or small bowel obstruction. These results will be called to the ordering clinician or representative by the Radiologist Assistant, and communication documented in the PACS or zVision Dashboard. Electronically Signed   By: Jerilynn Mages.  Shick M.D.   On: 04/22/2016 14:44    ASSESSMENT AND PLAN:   Active Problems:   Cholecystitis, acute  * Acute cholecystitis   Confirmed by CT scan, ultrasound and HIDA scan.   S/p laproscopic cholecystectomy 04/23/16.   Manage per surgery.   Upgrading diet today.   As his albumin is low, and calcium is low- will advise using ensure twice daily.  * Acute on chronic renal failure   Worsening in renal function because of poor oral intake due to pain and cholecystitis for last 2-3 days.   IV fluid and continue monitoring.    Stable, at baseline now.  * Diabetes   On long-acting insulin sliding scale coverage in hospital.  * Coronary artery disease   Resume cardiac meds, spoke to Dr. Pat Patrick- safe to start ASA now.  * Hypertension   Blood pressure is stable, continue monitoring resume atenolol.  * Hyperlipidemia   Continue statin.  All the records are reviewed and case discussed with Care Management/Social Workerr. Management plans discussed with the patient, family and they are in agreement.  CODE STATUS: Full code  TOTAL TIME TAKING CARE OF THIS PATIENT: 35 minutes.   POSSIBLE D/C IN 1-2 DAYS, DEPENDING ON CLINICAL CONDITION. I will sign off- as pt is stable.  Please call for any questions.  Vaughan Basta M.D on 04/24/2016   Between 7am to 6pm - Pager  - [phone]  After 6pm go to [email or web address] - password [email or web address] [facility]  Office  [phone]  CC: Primary care physician; Halina Maidens, MD  Note: This dictation was prepared with Dragon dictation along with smaller phrase technology. Any transcriptional errors that result from this process are unintentional.

## 2016-04-24 NOTE — Progress Notes (Signed)
Subjective:   He is feeling much better today. He is less confused and reports less abdominal pain. He does complain of some incisional pain but can't tell improvement in his abdominal pain from yesterday. He has voided spontaneously although he did have some incontinence last night. He denies any significant nausea or vomiting this morning has tolerated some liquids.  Vital signs in last 24 hours: Temp:  [97.4 F (36.3 C)-98.2 F (36.8 C)] 97.5 F (36.4 C) (07/12 0455) Pulse Rate:  [63-90] 64 (07/12 0455) Resp:  [16-22] 18 (07/12 0455) BP: (136-175)/(65-95) 151/95 mmHg (07/12 0455) SpO2:  [91 %-97 %] 93 % (07/12 0455) Last BM Date: 04/19/16  Intake/Output from previous day: 07/11 0701 - 07/12 0700 In: 1992.9 [P.O.:120; I.V.:1723; IV Piggyback:149.9] Out: 890 [Urine:775; Drains:100; Blood:15]  Exam:  His abdominal exam looks good. He has minimal serosanguineous drainage in his JP drain. He has no significant abdominal tenderness other than his incisional tenderness.  Lab Results:  CBC  Recent Labs  04/22/16 0413 04/24/16 0441  WBC 13.9* 9.4  HGB 13.9 13.3  HCT 41.0 39.2*  PLT 96* 123*   CMP     Component Value Date/Time   NA 133* 04/24/2016 0441   NA 138 03/16/2015 1047   NA 140 12/10/2012 0726   K 4.6 04/24/2016 0441   K 4.6 12/10/2012 0726   CL 106 04/24/2016 0441   CL 111* 12/10/2012 0726   CO2 19* 04/24/2016 0441   CO2 20* 12/10/2012 0726   GLUCOSE 185* 04/24/2016 0441   GLUCOSE 164* 03/16/2015 1047   GLUCOSE 181* 12/10/2012 0726   BUN 36* 04/24/2016 0441   BUN 27 03/16/2015 1047   BUN 33* 09/01/2013 0951   CREATININE 2.86* 04/24/2016 0441   CREATININE 2.27* 09/01/2013 0951   CALCIUM 7.9* 04/24/2016 0441   CALCIUM 8.1* 12/10/2012 0726   PROT 5.9* 04/24/2016 0441   PROT 7.0 03/19/2016 0954   PROT 6.3* 11/07/2011 1859   ALBUMIN 2.2* 04/24/2016 0441   ALBUMIN 3.9 03/19/2016 0954   ALBUMIN 2.8* 11/07/2011 1859   AST 127* 04/24/2016 0441   AST 31  11/07/2011 1859   ALT 90* 04/24/2016 0441   ALT 33 11/07/2011 1859   ALKPHOS 209* 04/24/2016 0441   ALKPHOS 88 11/07/2011 1859   BILITOT 1.4* 04/24/2016 0441   BILITOT 0.4 03/19/2016 0954   BILITOT 0.3 11/07/2011 1859   GFRNONAA 20* 04/24/2016 0441   GFRNONAA 28* 09/01/2013 0951   GFRNONAA 37* 11/08/2011 0252   GFRAA 23* 04/24/2016 0441   GFRAA 32* 09/01/2013 0951   GFRAA 45* 11/08/2011 0252   PT/INR No results for input(s): LABPROT, INR in the last 72 hours.  Studies/Results: Nm Hepato W/eject Fract  04/22/2016  CLINICAL DATA:  Acute cholecystitis by ultrasound and CT. EXAM: NUCLEAR MEDICINE HEPATOBILIARY IMAGING TECHNIQUE: Sequential images of the abdomen were obtained out to 60 minutes following intravenous administration of radiopharmaceutical. RADIOPHARMACEUTICALS:  5.3 mCi Tc-6m  Choletec IV COMPARISON:  04/21/2016 CT and ultrasound FINDINGS: Normal hepatic uptake and biliary excretion of the radiopharmaceutical. Activity progresses throughout the small bowel. There is reflux of activity into the duodenum and stomach. Gallbladder is not visualized at approximately 1 hour. 3 mg morphine administered. Despite this and additional delayed imaging out to 4 hours, the gallbladder is not demonstrated. This is compatible with cystic duct obstruction and cholecystitis. IMPRESSION: Nonvisualization of the gallbladder despite administered morphine and delayed imaging compatible with cystic duct obstruction secondary to cholecystitis. No biliary duct obstruction or small bowel obstruction.  These results will be called to the ordering clinician or representative by the Radiologist Assistant, and communication documented in the PACS or zVision Dashboard. Electronically Signed   By: Jerilynn Mages.  Shick M.D.   On: 04/22/2016 14:44    Assessment/Plan: We will increase his activity and advance his diet. I will leave his drain another 24 hours. His hemoglobin is stable at this point. He had a slight bump in his  bilirubin which would not be unexpected following his gangrenous cholecystitis. I discussed the plan with his wife and the patient and they're in agreement.

## 2016-04-25 LAB — CREATININE, SERUM
CREATININE: 2.7 mg/dL — AB (ref 0.61–1.24)
GFR calc Af Amer: 25 mL/min — ABNORMAL LOW (ref >60)
GFR, EST NON AFRICAN AMERICAN: 21 mL/min — AB (ref >60)

## 2016-04-25 LAB — GLUCOSE, CAPILLARY
GLUCOSE-CAPILLARY: 208 mg/dL — AB (ref 65–99)
GLUCOSE-CAPILLARY: 220 mg/dL — AB (ref 65–99)
GLUCOSE-CAPILLARY: 220 mg/dL — AB (ref 65–99)
GLUCOSE-CAPILLARY: 244 mg/dL — AB (ref 65–99)
GLUCOSE-CAPILLARY: 291 mg/dL — AB (ref 65–99)
Glucose-Capillary: 247 mg/dL — ABNORMAL HIGH (ref 65–99)
Glucose-Capillary: 204 mg/dL — ABNORMAL HIGH (ref 65–99)
Glucose-Capillary: 345 mg/dL — ABNORMAL HIGH (ref 65–99)

## 2016-04-25 MED ORDER — ATENOLOL 50 MG PO TABS
50.0000 mg | ORAL_TABLET | Freq: Every day | ORAL | Status: DC
  Administered 2016-04-25 – 2016-04-26 (×2): 50 mg via ORAL
  Filled 2016-04-25 (×2): qty 1

## 2016-04-25 NOTE — Care Management Important Message (Signed)
Important Message  Patient Details  Name: [name] MRN: [id number] Date of Birth: [birth year]   Medicare Important Message Given:  Yes    [name] 04/25/2016, 12:40 PM

## 2016-04-25 NOTE — Progress Notes (Signed)
Physical Therapy Treatment Patient Details Name: [name] MRN: [id number] DOB: 1941/07/15 Today's Date: 04/25/2016    History of Present Illness Pt admitted for R upper quadrant epigastric px; Dx with acute cholecystitic, laparoscopic cholecystectomy performed 04/23/16. Medical Hx includes HTN, hyperlipidemia, thyroid disease, DM, CA, and CAD.    PT Comments    Pt reports 5/10 upper abdomen pain today. Pt is progressing towards his functional mobility goals and appears motivated to perform therapy to reach his goal of returning home. Pt required min guard for bed mobility, sit/stand transfers, and gait. Pt's O2 sats monitored throughout session and maintained stable at 92%-95% while on RA. Pt demonstrated difficulty with coming from supine/sit requiring use of B UE to pull on bed rails and sit on EOB. Pt demonstrated a step-through gait pattern and safe technique with ambulation with RW. PT provided resistance with LAQs in seated and pt seemed to tolerate it well. Pt will benefit from skilled PT in order improve mobility and endurance. Pt continues to be appropriate for HHPT upon discharge.  Follow Up Recommendations  Home health PT     Equipment Recommendations  Rolling walker with 5" wheels    Recommendations for Other Services       Precautions / Restrictions Precautions Precautions: Fall Precaution Comments: Pt fell from sitting on EOB on 7/10 Restrictions Weight Bearing Restrictions: No    Mobility  Bed Mobility Overal bed mobility: Needs Assistance Bed Mobility: Supine to Sit     Supine to sit: Min assist     General bed mobility comments: Pt is able to pull himself from supine to sit with UE using bedrails. Min assistance in sitting as pt tends to lean towards L, away from side of incision.  Transfers Overall transfer level: Modified independent Equipment used: Rolling walker (2 wheeled)             General transfer comment: Pt demonstrates safe technique  with sit/stand transfer with correct hand placement and pushing down through walker to assist transfer. Pt reports no light headedness or dizziness upon standing. O2 sats remain stable in the 90s while on RA.  Ambulation/Gait Ambulation/Gait assistance: Min assist Ambulation Distance (Feet): 160 Feet Assistive device: Rolling walker (2 wheeled) Gait Pattern/deviations: WFL(Within Functional Limits);Decreased step length - right   Gait velocity interpretation: Below normal speed for age/gender General Gait Details: Pt demonstrated a step-through gait pattern with RW. Pt did drift with RW when distracted but was able to continue on course. Pt was able to ambulate with with 1 rest break to check O2 sats. Pt O2 sats maintained in 92%-94% with ambulation on RA. Pt has no reports of light headedness or dizziness with ambulation.    Stairs            Wheelchair Mobility    Modified Rankin (Stroke Patients Only)       Balance Overall balance assessment: Needs assistance Sitting-balance support: Bilateral upper extremity supported;Feet supported Sitting balance-Leahy Scale: Good Sitting balance - Comments: Pt requires B UE for sitting balance.   Standing balance support: Bilateral upper extremity supported (on RW) Standing balance-Leahy Scale: Good Standing balance comment: Good standing balance with B UE support on walker. No LOB noted                    Cognition Arousal/Alertness: Awake/alert Behavior During Therapy: WFL for tasks assessed/performed Overall Cognitive Status: Within Functional Limits for tasks assessed  Exercises Other Exercises Other Exercises: Supine ther-ex: B abd/add, B SLR x 10 reps. Seated ther-ex: B marches, B LAQs, B resisted LAQs (PT providing resistance) x 10 reps.     General Comments        Pertinent Vitals/Pain Pain Assessment: 0-10 Pain Score: 5  Pain Location: Incision site Pain Descriptors /  Indicators: Grimacing Pain Intervention(s): Limited activity within patient's tolerance;Monitored during session    Home Living                      Prior Function            PT Goals (current goals can now be found in the care plan section) Acute Rehab PT Goals Patient Stated Goal: To return home PT Goal Formulation: With patient Time For Goal Achievement: 05/08/16 Potential to Achieve Goals: Good Progress towards PT goals: Progressing toward goals    Frequency  Min 2X/week    PT Plan Current plan remains appropriate    Co-evaluation             End of Session Equipment Utilized During Treatment: Gait belt Activity Tolerance: Patient tolerated treatment well Patient left: in chair;with call bell/phone within reach;with chair alarm set;with family/visitor present     Time: [id number] PT Time Calculation (min) (ACUTE ONLY): 23 min  Charges:  $Gait Training: 8-22 mins $Therapeutic Exercise: 8-22 mins                    G Codes:      Georgina Pillion 21-May-2016, 5:21 PM Georgina Pillion, SPT [phone]

## 2016-04-25 NOTE — Progress Notes (Signed)
Seen pt briefly.  He have no complains other than weakness. Tolerating diet well. BP slightly high- I increased dose of atenolol.  Possible plan is to d/c in 1-2 days.

## 2016-04-25 NOTE — Progress Notes (Signed)
Patient and wife voiced concern regarding how to cover/manage insulin for elevated "sugar" and diet at home. Dietician consulted with concerns and Diabetes  Coordinator re-consulted. Barbaraann Faster, RN 10:30 PM 04/25/2016

## 2016-04-25 NOTE — Progress Notes (Signed)
Dr. Jannifer Franklin called to request speech evaluation d/t patient having occasional coughing post swallowing water. Barbaraann Faster, RN 10:48 PM 04/25/2016

## 2016-04-25 NOTE — Progress Notes (Addendum)
Inpatient Diabetes Program Recommendations  AACE/ADA: New Consensus Statement on Inpatient Glycemic Control (2015)  Target Ranges:  Prepandial:   less than 140 mg/dL      Peak postprandial:   less than 180 mg/dL (1-2 hours)      Critically ill patients:  140 - 180 mg/dL   Lab Results  Component Value Date   GLUCAP 291* 04/25/2016   HGBA1C 10.2* 11/08/2011    Review of Glycemic Control  Results for [name], [name] (MRN [id number]) as of 04/25/2016 11:49  Ref. Range 04/24/2016 11:57 04/24/2016 16:04 04/24/2016 20:24 04/25/2016 00:09 04/25/2016 00:11 04/25/2016 03:45 04/25/2016 07:42 04/25/2016 11:36  Glucose-Capillary Latest Ref Range: 65-99 mg/dL 173 (H) 229 (H) 244 (H) 244 (H) 220 (H) 247 (H) 204 (H) 291 (H)   Diabetes history: DM2 Outpatient Diabetes medications: NPH 30 units BID, Regular 15-16 units BID Current orders for Inpatient glycemic control: Lantus 10 units bid, Novolog 0-15 units Q4H  Inpatient Diabetes Program Recommendations: Insulin - Basal: CBG still high- fasting sugar 247 mg/dl today-  Please consider increasing Lantus to 25 units QHS. Correction (SSI): Please consider increasing Novolog correction to Resistant scale- q4h blood sugars consistently high.  HgbA1C: Please consider ordering an A1C to evaluate glycemic control over the past 2-3 months.  Gentry Fitz, RN, BA, MHA, CDE Diabetes Coordinator Inpatient Diabetes Program  [phone] (Team Pager) [phone] ([place]) 04/25/2016 11:53 AM

## 2016-04-25 NOTE — Progress Notes (Signed)
Subjective:   He is feeling better. He has no nausea and only incisional pain. He has been walking with a walker. Overall he feels improved but certainly still quite weak.  Vital signs in last 24 hours: Temp:  [97.8 F (36.6 C)-98.4 F (36.9 C)] 98.4 F (36.9 C) (07/13 0441) Pulse Rate:  [67-99] 70 (07/13 0441) Resp:  [18] 18 (07/13 0441) BP: (149-177)/(81-91) 177/81 mmHg (07/13 0441) SpO2:  [89 %-95 %] 95 % (07/13 0441) Last BM Date: 04/19/16  Intake/Output from previous day: 07/12 0701 - 07/13 0700 In: 1590 [P.O.:1470; IV Piggyback:120] Out: 3071 [Urine:3025; Drains:46]  Exam:  His exam is unremarkable. His wounds look good. He has minimal JP drainage and it is all serosanguineous.  Lab Results:  CBC  Recent Labs  04/24/16 0441  WBC 9.4  HGB 13.3  HCT 39.2*  PLT 123*   CMP     Component Value Date/Time   NA 133* 04/24/2016 0441   NA 138 03/16/2015 1047   NA 140 12/10/2012 0726   K 4.6 04/24/2016 0441   K 4.6 12/10/2012 0726   CL 106 04/24/2016 0441   CL 111* 12/10/2012 0726   CO2 19* 04/24/2016 0441   CO2 20* 12/10/2012 0726   GLUCOSE 185* 04/24/2016 0441   GLUCOSE 164* 03/16/2015 1047   GLUCOSE 181* 12/10/2012 0726   BUN 36* 04/24/2016 0441   BUN 27 03/16/2015 1047   BUN 33* 09/01/2013 0951   CREATININE 2.70* 04/25/2016 0547   CREATININE 2.27* 09/01/2013 0951   CALCIUM 7.9* 04/24/2016 0441   CALCIUM 8.1* 12/10/2012 0726   PROT 5.9* 04/24/2016 0441   PROT 7.0 03/19/2016 0954   PROT 6.3* 11/07/2011 1859   ALBUMIN 2.2* 04/24/2016 0441   ALBUMIN 3.9 03/19/2016 0954   ALBUMIN 2.8* 11/07/2011 1859   AST 127* 04/24/2016 0441   AST 31 11/07/2011 1859   ALT 90* 04/24/2016 0441   ALT 33 11/07/2011 1859   ALKPHOS 209* 04/24/2016 0441   ALKPHOS 88 11/07/2011 1859   BILITOT 1.4* 04/24/2016 0441   BILITOT 0.4 03/19/2016 0954   BILITOT 0.3 11/07/2011 1859   GFRNONAA 21* 04/25/2016 0547   GFRNONAA 28* 09/01/2013 0951   GFRNONAA 37* 11/08/2011 0252   GFRAA  25* 04/25/2016 0547   GFRAA 32* 09/01/2013 0951   GFRAA 45* 11/08/2011 0252   PT/INR No results for input(s): LABPROT, INR in the last 72 hours.  Studies/Results: No results found.  Assessment/Plan: We will increase his activity plan to remove his drain tomorrow and tentatively plan discharge in the next 24-48 hours. He will plan to go home with a walker. His family is in agreement.

## 2016-04-26 LAB — GLUCOSE, CAPILLARY
GLUCOSE-CAPILLARY: 196 mg/dL — AB (ref 65–99)
GLUCOSE-CAPILLARY: 224 mg/dL — AB (ref 65–99)
Glucose-Capillary: 239 mg/dL — ABNORMAL HIGH (ref 65–99)
Glucose-Capillary: 319 mg/dL — ABNORMAL HIGH (ref 65–99)

## 2016-04-26 LAB — CREATININE, SERUM
Creatinine, Ser: 2.93 mg/dL — ABNORMAL HIGH (ref 0.61–1.24)
GFR calc Af Amer: 22 mL/min — ABNORMAL LOW (ref >60)
GFR calc non Af Amer: 19 mL/min — ABNORMAL LOW (ref >60)

## 2016-04-26 MED ORDER — FAMOTIDINE 20 MG PO TABS
20.0000 mg | ORAL_TABLET | Freq: Every day | ORAL | Status: DC
  Administered 2016-04-26: 20 mg via ORAL
  Filled 2016-04-26: qty 1

## 2016-04-26 MED ORDER — MAGNESIUM HYDROXIDE 400 MG/5ML PO SUSP
30.0000 mL | Freq: Once | ORAL | Status: AC
  Administered 2016-04-26: 30 mL via ORAL
  Filled 2016-04-26: qty 30

## 2016-04-26 MED ORDER — HYDROCODONE-ACETAMINOPHEN 5-325 MG PO TABS: 2.0000 | ORAL_TABLET | Freq: Four times a day (QID) | ORAL | Status: DC | PRN

## 2016-04-26 MED ORDER — ENOXAPARIN SODIUM 30 MG/0.3ML ~~LOC~~ SOLN: 30.0000 mg | SUBCUTANEOUS | Status: DC

## 2016-04-26 MED ORDER — GLUCERNA SHAKE PO LIQD
237.0000 mL | Freq: Two times a day (BID) | ORAL | Status: DC
  Administered 2016-04-26 (×2): 237 mL via ORAL

## 2016-04-26 MED ORDER — LIVING WELL WITH DIABETES BOOK
Freq: Once | Status: AC
  Administered 2016-04-26: 08:00:00
  Filled 2016-04-26: qty 1

## 2016-04-26 NOTE — Progress Notes (Addendum)
Inpatient Diabetes Program Recommendations  AACE/ADA: New Consensus Statement on Inpatient Glycemic Control (2015)  Target Ranges:  Prepandial:   less than 140 mg/dL      Peak postprandial:   less than 180 mg/dL (1-2 hours)      Critically ill patients:  140 - 180 mg/dL   Lab Results  Component Value Date   GLUCAP 196* 04/26/2016   HGBA1C 10.2* 11/08/2011    Review of Glycemic Control   Results for [name], [name] (MRN [id number]) as of 04/26/2016 07:35  Ref. Range 04/25/2016 16:37 04/25/2016 20:26 04/26/2016 00:00 04/26/2016 04:44 04/26/2016 07:19  Glucose-Capillary Latest Ref Range: 65-99 mg/dL 345 (H) 208 (H) 220 (H) 239 (H) 196 (H)    Diabetes history: DM2 Outpatient Diabetes medications: NPH 30 units BID, Regular 15-16 units BID Current orders for Inpatient glycemic control: Lantus 10 units bid, Novolog 0-15 units Q4H  Inpatient Diabetes Program Recommendations: Blood sugars remain elevated despite Novolog correction and Lantus   Please consider increasing Lantus to 25 units QHS.   Please consider increasing Novolog correction to Resistant scale (0-20units)- q4h blood sugars consistently high.    Please consider ordering an A1C to evaluate glycemic control over the past 2-3 months.  Based on RN note overnight, patient has concerns regarding his insulin. I have ordered the Living Well with Diabetes book for him.  Please use every interaction with him to review diabetes information.    Gentry Fitz, RN, BA, MHA, CDE Diabetes Coordinator Inpatient Diabetes Program  [phone] (Team Pager) [phone] ([place]) 04/26/2016 7:38 AM

## 2016-04-26 NOTE — Evaluation (Signed)
Clinical/Bedside Swallow Evaluation Patient Details  Name: [name] MRN: [id number] Date of Birth: January 03, 1941  Today's Date: 04/26/2016 Time: SLP Start Time (ACUTE ONLY): 0815 SLP Stop Time (ACUTE ONLY): 0900 SLP Time Calculation (min) (ACUTE ONLY): 45 min  Past Medical History:  Past Medical History  Diagnosis Date  . Hypertension   . Hyperlipidemia   . Thyroid disease   . Diabetes mellitus without complication (HCC)   . Cancer (HCC)   . Coronary artery disease    Past Surgical History:  Past Surgical History  Procedure Laterality Date  . Prostate surgery  2002    BPH benign pathology  . Coronary artery bypass graft  1997    x 3  . Spine surgery  1989    Lumbar disc  . 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 Appendectomy  2011  . Hernia repair  2011    Ventral hernia  . Knee surgery Left   . Cholecystectomy N/A 04/23/2016    Procedure: LAPAROSCOPIC CHOLECYSTECTOMY;  Surgeon: Tiney Rouge III, MD;  Location: [facility] ORS;  Service: General;  Laterality: N/A;   HPI:  [name] is a 75 y.o. male with persistent right upper quadrant and epigastric pain, intitially began 7/7 at 0100, woke him from sleep, located in right upper quadrant, associated with nausea and emesis. Not associated with meals. Then presented to the ED here, at which time he had a normal CBC, a normal RUQ [place] and a CT scan which did not show any findings to explain his symptoms. Of note his [place] at that time did not show any stones. He was given a GI cocktail and discharged from the ED. He had some mild improvement in his symptoms but then the evening of 7/8 he had an acute worsening of his symptoms and now has constant, 8/10 pain in the right upper quadrant, again associated with N/V and some diarrhea. Denies hematemesis, denies melena. Takes aleve very sparingly. Has not had a recent colonoscopy, does not take PPI or H2 blocker. Orders received for skilled ST to perform a Bedside  Swallow Evaluation d/t coughing with water during night.    Assessment / Plan / Recommendation Clinical Impression  Pt at reduced risk for aspiraiton following general aspiration on regular diet with thin liquids. Pt didn't display any s/s of aspiration or dysphagia with trials of regular and thin liquids. Education provided on general aspiration precautions. Nursing and pt verbalize understanding. Skilled ST doesn't appear indicated.     Aspiration Risk  No limitations    Diet Recommendation Regular with thin liquids, straws ok  Medication Administration: Whole meds with liquid    Other  Recommendations Oral Care Recommendations: Oral care BID   Follow up Recommendations  None     Swallow Study   General Date of Onset: 04/25/16 HPI: [name] is a 75 y.o. male with persistent right upper quadrant and epigastric pain, intitially began 7/7 at 0100, woke him from sleep, located in right upper quadrant, associated with nausea and emesis. Not associated with meals. Then presented to the ED here, at which time he had a normal CBC, a normal RUQ [place] and a CT scan which did not show any findings to explain his symptoms. Of note his [place] at that time did not show any stones. He was given a GI cocktail and discharged from the ED. He had some mild improvement in his symptoms but then the evening of 7/8 he  had an acute worsening of his symptoms and now has constant, 8/10 pain in the right upper quadrant, again associated with N/V and some diarrhea. Denies hematemesis, denies melena. Takes aleve very sparingly. Has not had a recent colonoscopy, does not take PPI or H2 blocker. Orders received for skilled ST to perform a Bedside Swallow Evaluation d/t coughing with water during night.  Type of Study: Bedside Swallow Evaluation Previous Swallow Assessment: N/A Diet Prior to this Study: NPO Temperature Spikes Noted: No Respiratory Status: Room air Length of Intubations (days):  (Surgery  only) Behavior/Cognition: Alert;Cooperative;Pleasant mood Oral Cavity Assessment: Within Functional Limits Oral Care Completed by SLP: No Oral Cavity - Dentition: Missing dentition Vision: Functional for self-feeding Self-Feeding Abilities: Able to feed self Patient Positioning: Upright in bed Baseline Vocal Quality: Normal Volitional Cough: Strong Volitional Swallow: Able to elicit    Oral/Motor/Sensory Function Overall Oral Motor/Sensory Function: Within functional limits   Ice Chips Ice chips: Not tested   Thin Liquid Thin Liquid: Within functional limits Presentation: Cup;Straw    Nectar Thick Nectar Thick Liquid: Not tested   Honey Thick Honey Thick Liquid: Not tested   Puree Puree: Not tested   Solid   GO   Solid: Within functional limits Presentation: Self Fed        Natelie Ostrosky 04/26/2016,1:49 PM

## 2016-04-26 NOTE — Progress Notes (Signed)
Enoxaparin   Patient qualifies for Enoxaparin 30  mg SQ daily based on CrCl <30 ml/min per policy. Will change to Enoxaparin 30 mg SQ daily.  Abbie Berling D. Fotini Lemus, PharmD   

## 2016-04-26 NOTE — Progress Notes (Signed)
Initial Nutrition Assessment     INTERVENTION:  -Monitor diet advancement and SLP recommendations. -RD provided "Plate Method for Balanced Meal" handout. Discussed different food groups and their effects on blood sugar, emphasizing carbohydrate-containing foods. Provided list of carbohydrates and recommended serving sizes of common foods.  Discussed importance of controlled and consistent carbohydrate intake throughout the day. Provided examples of ways to balance meals/snacks and encouraged intake of high-fiber, whole grain complex carbohydrates. Teach back method used.  Expect good compliance.  Left instructions for pt to notify nurse if further questions or concerns. -Recommend changing ensure to glucerna BID.    NUTRITION DIAGNOSIS:   Food and nutrition related knowledge deficit related to chronic illness as evidenced by  (consult for diet education).    GOAL:   Patient will meet greater than or equal to 90% of their needs    MONITOR:   PO intake, Labs  REASON FOR ASSESSMENT:   Consult Diet education  ASSESSMENT:      Pt admitted with acute cholecystitis, s/p cholecystectomy on 7/11  Past Medical History  Diagnosis Date  . Hypertension   . Hyperlipidemia   . Thyroid disease   . Diabetes mellitus without complication ([place])   . Cancer ([place])   . Coronary artery disease    Pt reports normal intake prior to admission eating 3 meals per day.  Wife not present during visit.    Medications reviewed:  Labs reviewed  Diet Order:  Diet NPO time specified Except for: Sips with Meds  Skin:  Reviewed, no issues  Last BM:  7/7  Height:   Ht Readings from Last 1 Encounters:  04/21/16 6\' 2"  (1.88 m)    Weight: Pt reports stable wt prior to admission  Wt Readings from Last 1 Encounters:  04/21/16 251 lb 11.2 oz (114.17 kg)    Ideal Body Weight:     BMI:  Body mass index is 32.3 kg/(m^2).  Estimated Nutritional Needs:   Kcal:  [id number]  kcals/d  Protein:  136-171 g/d  Fluid:  >/= 1927ml/d  EDUCATION NEEDS:   Education needs addressed  Micajah Dennin B. Zenia Resides, [place], [place] (pager) Weekend/On-Call pager [phone])

## 2016-04-26 NOTE — Progress Notes (Signed)
Physical Therapy Treatment Patient Details Name: [name] MRN: [id number] DOB: 01-Aug-1941 Today's Date: 04/26/2016    History of Present Illness Pt admitted for R upper quadrant epigastric px; Dx with acute cholecystitic, laparoscopic cholecystectomy performed 04/23/16. Medical Hx includes HTN, hyperlipidemia, thyroid disease, DM, CA, and CAD.    PT Comments    Pt has made great progress towards his ambulation and ther-ex goals. He ambulated 190 feet with RW, minimal exertion, and no SOB or dizziness. 02 sats were not measure as pt was asymptomatic and energetic. Conducted balance exercises in standing with perturbations in AP and ML directions with CGA and no UE support. He was able to use ankle strategy to resist gentile perturbation, but with overcompensation; LOB was usually posterior. Step strategy with LOB should be assessed. Pt would benefit from continued therapy to address his balance and coordination deficits.  Follow Up Recommendations  Home health PT     Equipment Recommendations  Rolling walker with 5" wheels    Recommendations for Other Services       Precautions / Restrictions Precautions Precautions: Fall Precaution Comments: Pt fell from sitting on EOB on 7/10 Restrictions Weight Bearing Restrictions: No    Mobility  Bed Mobility Overal bed mobility: Needs Assistance Bed Mobility: Supine to Sit     Supine to sit: Min assist     General bed mobility comments: Pt is able to pull himself from supine to sit with UE using bedrails. Min assistance in sitting as pt tends to lean towards L, away from side of incision.  Transfers Overall transfer level: Modified independent Equipment used: Rolling walker (2 wheeled)             General transfer comment: Pt required cuing to not pull walker towards him when attempting to stand.  Ambulation/Gait Ambulation/Gait assistance: Min assist Ambulation Distance (Feet): 190 Feet Assistive device: Rolling walker  (2 wheeled) Gait Pattern/deviations: Step-through pattern;Wide base of support Gait velocity: Pt ambulated appox 1'/s Gait velocity interpretation: <1.8 ft/sec, indicative of risk for recurrent falls General Gait Details: Pt demonstrated a step-through gait pattern with RW and CGA. He ambulated in a generally straight line with wide BOS and increased double support time.   Stairs            Wheelchair Mobility    Modified Rankin (Stroke Patients Only)       Balance Overall balance assessment: Needs assistance Sitting-balance support: Bilateral upper extremity supported Sitting balance-Leahy Scale: Good Sitting balance - Comments: Pt can maintain sitting balance independently, but does drift posteriorly without loss of control Postural control: Posterior lean Standing balance support: Bilateral upper extremity supported Standing balance-Leahy Scale: Good Standing balance comment: see exercises                    Cognition Arousal/Alertness: Awake/alert Behavior During Therapy: WFL for tasks assessed/performed Overall Cognitive Status: Within Functional Limits for tasks assessed                      Exercises Other Exercises Other Exercises: Balance training with perturbations in AP and ML directions with CGA, verbal cues, and feedback; SLS performed on each leg with CGA with avg of 2-4 seconds before LOB posteriorly, B LE exercises including standing leg ext X 15 reps, hip abd/add X 15 in supine, and SLR X 15 reps in supine, all with verbal/tactile cues.    General Comments        Pertinent Vitals/Pain Pain Assessment: No/denies  pain    Home Living                      Prior Function            PT Goals (current goals can now be found in the care plan section) Acute Rehab PT Goals Patient Stated Goal: To return home PT Goal Formulation: With patient Time For Goal Achievement: 05/08/16 Potential to Achieve Goals: Good Progress towards  PT goals: Progressing toward goals    Frequency  Min 2X/week    PT Plan Current plan remains appropriate    Co-evaluation             End of Session Equipment Utilized During Treatment: Gait belt Activity Tolerance: Patient tolerated treatment well Patient left: in bed;with call bell/phone within reach;with bed alarm set;with family/visitor present     Time: 1135-1159 PT Time Calculation (min) (ACUTE ONLY): 24 min  Charges:                       G Codes:      Merryl Buckels May 20, 2016, 12:51 PM  Cassell Smiles, SPT [phone]

## 2016-04-26 NOTE — Progress Notes (Signed)
Subjective:   He is feeling better this morning. Apparently he had some coughing last night only swallowing water and is set up for a swallowing study today. He has been ambulating with a walker. Overall he is stable and slowly improving.  Vital signs in last 24 hours: Temp:  [97.7 F (36.5 C)-98.2 F (36.8 C)] 97.8 F (36.6 C) (07/14 0447) Pulse Rate:  [59-68] 60 (07/14 0447) Resp:  [17-18] 17 (07/14 0447) BP: (147-151)/(80-91) 151/81 mmHg (07/14 0447) SpO2:  [92 %-95 %] 93 % (07/14 0447) Last BM Date: 04/19/16  Intake/Output from previous day: 07/13 0701 - 07/14 0700 In: 700 [P.O.:650; IV Piggyback:50] Out: 922 [Urine:850; Drains:72]  Exam:  His abdomen is soft. He does have some persistent incisional tenderness. His wounds look good with no infection. The JP drain was removed. He's breathing easily with good pulmonary excursion and no wheezing.  Lab Results:  CBC  Recent Labs  04/24/16 0441  WBC 9.4  HGB 13.3  HCT 39.2*  PLT 123*   CMP     Component Value Date/Time   NA 133* 04/24/2016 0441   NA 138 03/16/2015 1047   NA 140 12/10/2012 0726   K 4.6 04/24/2016 0441   K 4.6 12/10/2012 0726   CL 106 04/24/2016 0441   CL 111* 12/10/2012 0726   CO2 19* 04/24/2016 0441   CO2 20* 12/10/2012 0726   GLUCOSE 185* 04/24/2016 0441   GLUCOSE 164* 03/16/2015 1047   GLUCOSE 181* 12/10/2012 0726   BUN 36* 04/24/2016 0441   BUN 27 03/16/2015 1047   BUN 33* 09/01/2013 0951   CREATININE 2.93* 04/26/2016 0521   CREATININE 2.27* 09/01/2013 0951   CALCIUM 7.9* 04/24/2016 0441   CALCIUM 8.1* 12/10/2012 0726   PROT 5.9* 04/24/2016 0441   PROT 7.0 03/19/2016 0954   PROT 6.3* 11/07/2011 1859   ALBUMIN 2.2* 04/24/2016 0441   ALBUMIN 3.9 03/19/2016 0954   ALBUMIN 2.8* 11/07/2011 1859   AST 127* 04/24/2016 0441   AST 31 11/07/2011 1859   ALT 90* 04/24/2016 0441   ALT 33 11/07/2011 1859   ALKPHOS 209* 04/24/2016 0441   ALKPHOS 88 11/07/2011 1859   BILITOT 1.4* 04/24/2016 0441    BILITOT 0.4 03/19/2016 0954   BILITOT 0.3 11/07/2011 1859   GFRNONAA 19* 04/26/2016 0521   GFRNONAA 28* 09/01/2013 0951   GFRNONAA 37* 11/08/2011 0252   GFRAA 22* 04/26/2016 0521   GFRAA 32* 09/01/2013 0951   GFRAA 45* 11/08/2011 0252   PT/INR No results for input(s): LABPROT, INR in the last 72 hours.  Studies/Results: No results found.  Assessment/Plan: I have removed his JP drain. Depending on the stress results of his diabetic diet consult and his speech therapy consult we will discuss discharge. He is in agreement with this plan. We anticipate him going home on a rolling walker.

## 2016-04-26 NOTE — Discharge Summary (Signed)
Patient ID: [name] MRN: [id number] DOB/AGE: 11-19-1940 75 y.o.  Admit date: 04/20/2016 Discharge date: 04/26/2016  Discharge Diagnoses:  Acute gangrenous cholecystitis and cholelithiasis  Procedures Performed: Laparoscopic cholecystectomy  Discharged Condition: good  Hospital Course: He was admitted to the hospital on the evening of July 9 significant abdominal pain. Imaging and clinical studies suggested acute cholecystitis. He was seen by the internal medicine and cardiology services who felt he was a low risk for surgical intervention. Echocardiogram revealed a satisfactory ejection fraction. After risk assessment by both services he was taken to surgery on the morning of 04/23/2016 and underwent a laparoscopic cholecystectomy. He had acute gangrenous cholecystitis. A drain was placed. He was mildly confused immediately after surgery but improved over the next 24 hours. He's continue to improve since that time. 5 by internal medicine and cardiology has been seen by physical therapy and home health. He is ambulating with a walker and with assistance. He is discharged home this evening to follow the office next week for suture removal and further follow-up. Arrangements have been made for home health physical therapy. I discussed this plan with the patient and his family. They are in agreement.  Discharge Orders: Discharge Instructions    Diet - low sodium heart healthy    Complete by:  As directed      Discharge instructions    Complete by:  As directed   Do not drive until follow-up visit, do not lift anything heavier than a dinner plate until follow-up visit. You may bathe     Increase activity slowly    Complete by:  As directed      No wound care    Complete by:  As directed            Disposition: 01-Home or Self Care  Discharge Medications:  Current facility-administered medications:  .  acetaminophen (TYLENOL) tablet 650 mg, 650 mg, Oral, Q6H PRN **OR**  acetaminophen (TYLENOL) suppository 650 mg, 650 mg, Rectal, Q6H PRN, [name], MD .  aspirin chewable tablet 81 mg, 81 mg, Oral, Daily, Vaughan Basta, MD, 81 mg at 04/26/16 1008 .  atenolol (TENORMIN) tablet 50 mg, 50 mg, Oral, Daily, Vaughan Basta, MD, 50 mg at 04/26/16 1008 .  [START ON 04/27/2016] enoxaparin (LOVENOX) injection 30 mg, 30 mg, Subcutaneous, Q24H, [name], MD .  famotidine (PEPCID) tablet 20 mg, 20 mg, Oral, Daily, [name], MD, 20 mg at 04/26/16 1008 .  feeding supplement (GLUCERNA SHAKE) (GLUCERNA SHAKE) liquid 237 mL, 237 mL, Oral, BID BM, [name], MD, 237 mL at 04/26/16 1400 .  guaiFENesin (MUCINEX) 12 hr tablet 600 mg, 600 mg, Oral, BID, Vaughan Basta, MD, 600 mg at 04/26/16 1008 .  HYDROcodone-acetaminophen (NORCO/VICODIN) 5-325 MG per tablet 2 tablet, 2 tablet, Oral, Q6H PRN, [name], MD, 2 tablet at 04/23/16 2347 .  insulin aspart (novoLOG) injection 0-15 Units, 0-15 Units, Subcutaneous, Q4H, Mali Gonczy, MD, 11 Units at 04/26/16 1656 .  insulin glargine (LANTUS) injection 10 Units, 10 Units, Subcutaneous, BID, Vaughan Basta, MD, 10 Units at 04/26/16 1008 .  levothyroxine (SYNTHROID, LEVOTHROID) tablet 300 mcg, 300 mcg, Oral, QAC breakfast, Vaughan Basta, MD, 300 mcg at 04/26/16 0817 .  ondansetron (ZOFRAN-ODT) disintegrating tablet 4 mg, 4 mg, Oral, Q6H PRN **OR** ondansetron (ZOFRAN) injection 4 mg, 4 mg, Intravenous, Q6H PRN, Mali Gonczy, MD, 4 mg at 04/23/16 1058 .  simvastatin (ZOCOR) tablet 20 mg, 20 mg, Oral, q1800, Vaughan Basta, MD, 20  mg at 04/25/16 1856  Follwup: Follow-up Information    Follow up with [facility] In 1 week.   Why:  For suture removal, For wound re-check   Contact information:   [address] [id number]      Signed: [name] 04/26/2016, 5:47 PM

## 2016-04-26 NOTE — Care Management (Signed)
Order has been placed for RW.  Bethany from [facility] care aware and to deliver to room.  MD to place order for home health PT.  Bethany from [facility] to notify patient and wife of copay.

## 2016-04-26 NOTE — Discharge Instructions (Signed)

## 2016-04-26 NOTE — Progress Notes (Addendum)
Inpatient Diabetes Program Recommendations  AACE/ADA: New Consensus Statement on Inpatient Glycemic Control (2015)  Target Ranges:  Prepandial:   less than 140 mg/dL      Peak postprandial:   less than 180 mg/dL (1-2 hours)      Critically ill patients:  140 - 180 mg/dL   Lab Results  Component Value Date   GLUCAP 196* 04/26/2016   HGBA1C 10.2* 11/08/2011    Met with patient - he confirms he takes NPH insulin 30 units qam and NPH 30 units at hs.  Takes sliding scale R insulin 15 to as much as 23 units at breakfast and R 10 units at supper.  He does not mix his insulin in one syringe, he gives the R and N in separate syringes.  He is a patient of Dr. Eddie Dibbles and has had diabetes since 1986.  He rotates insulin in his abdomen.  He eats three meals per day, keeps glucose tabs at home to use for low blood sugars- he has been having some low blood sugars in the afternoon- treats appropriately.    He will need to go home on N and R for affordability and ease.  He has no questions regarding his diabetes. Knows what an A1C is and tells me his last one was elevated and that Dr. Eddie Dibbles wants him at 7%.   I have giving him the 1-800 phone number for the [facility] nurse line.   He currently does not have an appointment with Dr. Eddie Dibbles but will schedule one.   Gentry Fitz, RN, BA, MHA, CDE Diabetes Coordinator Inpatient Diabetes Program  [phone] (Team Pager) [phone] ([place]) 04/26/2016 8:48 AM

## 2016-04-29 DIAGNOSIS — Z794 Long term (current) use of insulin: Secondary | ICD-10-CM | POA: Diagnosis not present

## 2016-04-29 DIAGNOSIS — I2510 Atherosclerotic heart disease of native coronary artery without angina pectoris: Secondary | ICD-10-CM | POA: Diagnosis not present

## 2016-04-29 DIAGNOSIS — E785 Hyperlipidemia, unspecified: Secondary | ICD-10-CM | POA: Diagnosis not present

## 2016-04-29 DIAGNOSIS — Z87891 Personal history of nicotine dependence: Secondary | ICD-10-CM | POA: Diagnosis not present

## 2016-04-29 DIAGNOSIS — E119 Type 2 diabetes mellitus without complications: Secondary | ICD-10-CM | POA: Diagnosis not present

## 2016-04-29 DIAGNOSIS — Z7982 Long term (current) use of aspirin: Secondary | ICD-10-CM | POA: Diagnosis not present

## 2016-04-29 DIAGNOSIS — Z48815 Encounter for surgical aftercare following surgery on the digestive system: Secondary | ICD-10-CM | POA: Diagnosis not present

## 2016-04-29 DIAGNOSIS — E039 Hypothyroidism, unspecified: Secondary | ICD-10-CM | POA: Diagnosis not present

## 2016-04-29 DIAGNOSIS — I10 Essential (primary) hypertension: Secondary | ICD-10-CM | POA: Diagnosis not present

## 2016-04-29 NOTE — Anesthesia Postprocedure Evaluation (Signed)
Anesthesia Post Note  Patient: [name]  Procedure(s) Performed: Procedure(s) (LRB): LAPAROSCOPIC CHOLECYSTECTOMY (N/A)  Patient location during evaluation: PACU Anesthesia Type: General Level of consciousness: awake and alert Pain management: pain level controlled Vital Signs Assessment: post-procedure vital signs reviewed and stable Respiratory status: spontaneous breathing, nonlabored ventilation, respiratory function stable and patient connected to nasal cannula oxygen Cardiovascular status: blood pressure returned to baseline and stable Postop Assessment: no signs of nausea or vomiting Anesthetic complications: no    Last Vitals:  Filed Vitals:   04/26/16 0447 04/26/16 1213  BP: 151/81 138/92  Pulse: 60 55  Temp: 36.6 C 36.7 C  Resp: 17 18    Last Pain:  Filed Vitals:   04/26/16 1213  PainSc: 0-No pain                 Molli Barrows

## 2016-05-01 ENCOUNTER — Telehealth: Payer: Self-pay

## 2016-05-01 NOTE — Telephone Encounter (Signed)
Spoke with Raquel Sarna (Physical Therapist) that is currently in patient's home. She states that patient has Serosanginous drainage from incision, denies fever (Temperature is currently 97.8),  Area around incision is pink and warm to touch. Patient's wife is concerned about infection currently. Received picture of incision.  Picture was reviewed with Dr. Adonis Huguenin. He would like to see patient tomorrow in office. Patient placed on schedule for tomorrow.   Returned to Physical therapist and she was given appointment information for tomorrow.

## 2016-05-01 NOTE — Telephone Encounter (Signed)
[name] ([facility] at [facility]) is calling in regards to the patient. He just finished having a laparoscopic cholecystectomy on 04/23/2016 With Doctor [name]. He was discharged on 04/26/2016. He is red, warm, and draining on his incision. He has an appointment on 05/06/16 with Dr. Burt Ruiz but the nurse wants to get him in sooner. [name] is going to stay with the patient until given a call back with information. A good contact number is [phone].

## 2016-05-02 ENCOUNTER — Ambulatory Visit (INDEPENDENT_AMBULATORY_CARE_PROVIDER_SITE_OTHER): Payer: PPO | Admitting: General Surgery

## 2016-05-02 ENCOUNTER — Encounter: Payer: Self-pay | Admitting: General Surgery

## 2016-05-02 VITALS — BP 129/77 | HR 66 | Temp 98.3°F

## 2016-05-02 DIAGNOSIS — Z4889 Encounter for other specified surgical aftercare: Secondary | ICD-10-CM

## 2016-05-02 MED ORDER — SULFAMETHOXAZOLE-TRIMETHOPRIM 800-160 MG PO TABS: 1.0000 | ORAL_TABLET | Freq: Two times a day (BID) | ORAL | Status: DC

## 2016-05-02 NOTE — Progress Notes (Signed)
Outpatient Surgical Follow Up  05/02/2016  [name] is an 75 y.o. male.   Chief Complaint  Patient presents with  . Routine Post Op    Laparoscopic Cholecystectomy 04/23/2016 Dr. Pat Patrick    HPI: 75 year old male reports to clinic 9 days status post laparoscopic cholecystectomy. Patient reported early because his upper midline incision had become angry red, hot and had increasing drainage over the past 2 days. His wife was concerned that it was becoming infected. Patient states he's been tolerating a diet and his pain is well controlled with as needed medications. He denies any fevers, chills, nausea, vomiting, chest pain, short of breath, diarrhea, cost patient.  Past Medical History  Diagnosis Date  . Hypertension   . Hyperlipidemia   . Thyroid disease   . Diabetes mellitus without complication ([place])   . Cancer ([place])   . Coronary artery disease     Past Surgical History  Procedure Laterality Date  . Prostate surgery  2002    BPH benign pathology  . Coronary artery bypass graft  1997    x 3  . Spine surgery  1989    Lumbar disc  . 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 Appendectomy  2011  . Hernia repair  2011    Ventral hernia  . Knee surgery Left   . Cholecystectomy N/A 04/23/2016    Procedure: LAPAROSCOPIC CHOLECYSTECTOMY;  Surgeon: [name], MD;  Location: [facility] ORS;  Service: General;  Laterality: N/A;    Family History  Problem Relation Age of Onset  . Cancer Mother     Lung  . Diabetes Mother   . Heart disease Mother     Social History:  reports that he quit smoking about 19 years ago. He does not have any smokeless tobacco history on file. He reports that he does not drink alcohol or use illicit drugs.  Allergies:  Allergies  Allergen Reactions  . Ace Inhibitors Other (See Comments)    Reaction:  Raises potassium     Medications reviewed.    ROS A multipoint review of systems was completed. All pertinent positives and  negatives are documented within the history of present illness and remainder are negative.   BP 129/77 mmHg  Pulse 66  Temp(Src) 98.3 F (36.8 C) (Oral)  Physical Exam Gen.: No acute distress Chest: Clear to auscultation Heart: Regular rate and rhythm Abdomen: Soft, appropriately tender to palpation at incision sites, nondistended. Upper midline incision with obvious purulent drainage and erythema around the incision site. The remainder of the laparoscopic incisions are well approximated without evidence of erythema or drainage.    No results found for this or any previous visit (from the past 48 hour(s)). No results found.  Assessment/Plan:  1. Aftercare following surgery 75 year old male status post laparoscopic cholecystectomy. Pathology reviewed with the patient and his wife. Upper midline incision with evidence of superficial wound infection. Sutures removed today in clinic and purulent fluid drained from the area. Once all drainage ceased a sterile dressing was placed over this. Counseled the patient and his wife about performing twice daily dressing changes. We will start him on an oral antibiotic and he will keep his previously scheduled follow-up next week to ensure improvement to this area. Should the area worsen and inset of improve they are to return to clinic or the emergency room immediately for additional follow-up. Patient was started on Bactrim this visit.     Clayburn Pert, MD  FACS General Surgeon  05/02/2016,1:51 PM

## 2016-05-02 NOTE — Patient Instructions (Addendum)
Please change your bandages twice a day.   If you notice that you start to feeling sick with a fever or you start to have abdominal pain, please go to the emergency room.  Start taking your antibiotics and finish them.

## 2016-05-06 ENCOUNTER — Encounter: Payer: Self-pay | Admitting: Surgery

## 2016-05-06 ENCOUNTER — Ambulatory Visit (INDEPENDENT_AMBULATORY_CARE_PROVIDER_SITE_OTHER): Payer: PPO | Admitting: Surgery

## 2016-05-06 ENCOUNTER — Encounter: Payer: PPO | Admitting: Surgery

## 2016-05-06 VITALS — BP 122/61 | HR 70 | Temp 97.7°F | Ht 74.0 in

## 2016-05-06 DIAGNOSIS — Z4889 Encounter for other specified surgical aftercare: Secondary | ICD-10-CM

## 2016-05-06 NOTE — Progress Notes (Signed)
Patient returns following laparoscopic cholecystectomy. He was seen by Dr. Adonis Huguenin last week with an infected epigastric wound. She's were removed at that time. He's been using a dry dressing and Bactrim twice a day. He feels well and having no other complaints.  No erythema. The wound itself is approximately 1-1-1/2 cm deep but no purulence. Other wounds are healing well and sutures are removed. No sign of infection at the other incisions.  Patient doing very well I recommended packing with a corner of a 4 x 4 gauze twice a day as opposed to just laying the dressing on top I think that this may enhance and hasten closure. We will see him next week In the office.

## 2016-05-06 NOTE — Patient Instructions (Signed)
Please see appointment listed below. Please call our office if you have questions or concerns.

## 2016-05-15 ENCOUNTER — Ambulatory Visit (INDEPENDENT_AMBULATORY_CARE_PROVIDER_SITE_OTHER): Payer: PPO | Admitting: Surgery

## 2016-05-15 ENCOUNTER — Encounter: Payer: Self-pay | Admitting: Surgery

## 2016-05-15 VITALS — BP 130/78 | HR 87 | Temp 98.3°F | Wt 233.0 lb

## 2016-05-15 DIAGNOSIS — K810 Acute cholecystitis: Secondary | ICD-10-CM

## 2016-05-15 NOTE — Progress Notes (Signed)
75 year old male who had gangrenous cholecystitis with laparoscopic cholecystectomy performed by Dr. Pat Patrick on July 1.  Patient has been doing well getting his appetite and energy back. Patient does have an area where the JP drain was this continuing to have drainage and has been packing the wound with dry gauze. Patient states that the area is attempting to heal and still have some drainage from the area. Patient states is much improved from where it was. Patient states his blood sugars have been remaining around 150 or less and that he's been checking them.  Vitals:   05/15/16 1138  BP: 130/78  Pulse: 87  Temp: 98.3 F (36.8 C)   PE:  Gen: NAD ABd: soft, appropriately tender, one incision site Subxiphoid area small open area with some fibrinous material on the bottom. This was cleaned with dry gauze. No erythema or purulent drainage.  A/P:  I discussed the importance of appropriate dressing changes with the patient and wife in the room. Patient agrees to let wife perform the dressing changes for now on. Patient also understands the importance of keeping his blood sugars in good control and will continue do so. Also expressed the importance of continuing a high protein diet. The patient does not like ensure and suggested getting a high protein yogurt instead twice a day. We'll have the patient follow-up in 2 weeks for a wound check.

## 2016-05-15 NOTE — Patient Instructions (Signed)
Please try to control your sugar levels less than 180. If it gets more than 200, it was cause for your healing process to slow down.  Please clean your incision with a swab and gauze twice a day.  You need to eat protein to help you heal faster too. Try to eat Yogurt and peanut butter a couple of times a day. You could also try to drink Boost.  For your jock itch, try to pour corn starch and athlete's foot cream to the affected area. The most important thing is to keep it dry.

## 2016-05-29 ENCOUNTER — Ambulatory Visit (INDEPENDENT_AMBULATORY_CARE_PROVIDER_SITE_OTHER): Payer: PPO | Admitting: Surgery

## 2016-05-29 ENCOUNTER — Encounter: Payer: Self-pay | Admitting: Surgery

## 2016-05-29 VITALS — BP 127/78 | HR 72 | Temp 97.6°F | Ht 74.0 in | Wt 237.0 lb

## 2016-05-29 DIAGNOSIS — Z4889 Encounter for other specified surgical aftercare: Secondary | ICD-10-CM

## 2016-05-29 NOTE — Progress Notes (Signed)
Outpatient postop visit  05/29/2016  [name] is an 75 y.o. male.    Procedure: Laparoscopic cholecystectomy  CC: Wound care  HPI: This patient had an infected epigastric wound following laparoscopic cholecystectomy he has been on local wound care. He is only putting a dry dressing over the top of the wound. No packing at this time. Medications reviewed.    Physical Exam:  There were no vitals taken for this visit.    PE: No erythema minimal colonize purulence on the interior of a 1 x 1 cm cavity. The wound opening is 1 cm.    Assessment/Plan:  Patient is doing well but he would benefit from twice a day packing with quarter-inch Nu Gauze. I have shown he and his wife how to do that personally and we will provide him with the dressing materials. This should be done twice a day and I will see them back in the office next week  Florene Glen, MD, FACS

## 2016-05-29 NOTE — Patient Instructions (Signed)
Please place the ribbon of packing into this area daily and keep a dry guaze over with paper tape as you have been shown today.  We will have you follow-up in office next week as scheduled below.

## 2016-06-05 ENCOUNTER — Ambulatory Visit (INDEPENDENT_AMBULATORY_CARE_PROVIDER_SITE_OTHER): Payer: PPO | Admitting: General Surgery

## 2016-06-05 ENCOUNTER — Encounter: Payer: Self-pay | Admitting: General Surgery

## 2016-06-05 VITALS — BP 115/68 | HR 57 | Temp 97.7°F | Ht 74.0 in | Wt 237.0 lb

## 2016-06-05 DIAGNOSIS — Z4889 Encounter for other specified surgical aftercare: Secondary | ICD-10-CM

## 2016-06-05 NOTE — Patient Instructions (Signed)
Please call if you have any questions or concerns.  Call in 2 weeks if you do not feel like your incision is completely healed and we will place you on the schedule to see the surgeon.

## 2016-06-05 NOTE — Progress Notes (Signed)
Outpatient Surgical Follow Up  06/05/2016  [name] is an 75 y.o. male.   Chief Complaint  Patient presents with  . Routine Post Op    Laparoscopy Cholecystectomy-04/23/16-Dr.Ely    HPI: 75 year old male returns to clinic for additional follow-up status post laparoscopic cholecystectomy that was complicated by superficial wound infection to the upper midline port. Patient reports the area continues to better and better. Continuing to do twice daily packing with Nu Gauze. He denies any fevers, chills, nausea, vomiting, chest pain, shortness of breath, diarrhea, constipation is otherwise doing very well and is very happy with his surgical experience.  Past Medical History:  Diagnosis Date  . Cancer ([place])   . Coronary artery disease   . Diabetes mellitus without complication ([place])   . Hyperlipidemia   . Hypertension   . Thyroid disease     Past Surgical History:  Procedure Laterality Date  . APPENDECTOMY  2011  . CHOLECYSTECTOMY N/A 04/23/2016   Procedure: LAPAROSCOPIC CHOLECYSTECTOMY;  Surgeon: [name], MD;  Location: [facility] ORS;  Service: General;  Laterality: N/A;  . COLON SURGERY  2011   Colectomy for ileo-cecal valve cancer  . CORONARY ARTERY BYPASS GRAFT  1997   x 3  . HERNIA REPAIR  2011   Ventral hernia  . KNEE SURGERY Left   . PROSTATE SURGERY  2002   BPH benign pathology  . SPINE SURGERY  1989   Lumbar disc    Family History  Problem Relation Age of Onset  . Cancer Mother     Lung  . Diabetes Mother   . Heart disease Mother     Social History:  reports that he quit smoking about 19 years ago. His smoking use included Cigarettes. He has never used smokeless tobacco. He reports that he does not drink alcohol or use drugs.  Allergies:  Allergies  Allergen Reactions  . Ace Inhibitors Other (See Comments)    Reaction:  Raises potassium   . Quinapril Rash    hyperkalemia    Medications reviewed.    ROS a multipoint review of systems was  completed, all pertinent positives and negatives are documented within the history of present illness and remainder are negative.  BP 115/68 (BP Location: Right Arm, Patient Position: Sitting, Cuff Size: Normal)   Pulse (!) 57   Temp 97.7 F (36.5 C) (Oral)   Ht 6\' 2"  (1.88 m)   Wt 107.5 kg (237 lb)   BMI 30.43 kg/m   Physical Exam Gen.: No acute distress Chest: Clear to auscultation Heart: Regular rhythm Abdomen: Soft, nontender, nondistended. Midline incision site with a very small opening that barely accepts the Nu Gauze. No evidence of spreading erythema or purulent drainage.    No results found for this or any previous visit (from the past 48 hour(s)). No results found.  Assessment/Plan:  1. Aftercare following surgery 75 year old male status post laparoscopic cholecystectomy, located by superficial wound infection. Continues to do much better. Discussed decreasing the packing amount every time that is changed. Also discussed signs and symptoms of infection or worsening and a return to clinic immediately should they occur. Otherwise discussed with patient that should be healed within the next 2 weeks and if it is not he is to call the clinic to be checked again. He voiced understanding of follow up on an as-needed basis.     Clayburn Pert, MD FACS General Surgeon  06/05/2016,1:51 PM

## 2016-06-16 ENCOUNTER — Other Ambulatory Visit: Payer: Self-pay | Admitting: Internal Medicine

## 2016-07-22 ENCOUNTER — Encounter: Payer: Self-pay | Admitting: Internal Medicine

## 2016-07-22 ENCOUNTER — Ambulatory Visit (INDEPENDENT_AMBULATORY_CARE_PROVIDER_SITE_OTHER): Payer: PPO | Admitting: Internal Medicine

## 2016-07-22 ENCOUNTER — Other Ambulatory Visit: Payer: Self-pay | Admitting: Internal Medicine

## 2016-07-22 VITALS — BP 110/72 | HR 68 | Resp 16 | Ht 74.0 in | Wt 237.0 lb

## 2016-07-22 DIAGNOSIS — Z23 Encounter for immunization: Secondary | ICD-10-CM

## 2016-07-22 DIAGNOSIS — E1122 Type 2 diabetes mellitus with diabetic chronic kidney disease: Secondary | ICD-10-CM

## 2016-07-22 DIAGNOSIS — L739 Follicular disorder, unspecified: Secondary | ICD-10-CM | POA: Diagnosis not present

## 2016-07-22 DIAGNOSIS — Z125 Encounter for screening for malignant neoplasm of prostate: Secondary | ICD-10-CM | POA: Diagnosis not present

## 2016-07-22 DIAGNOSIS — Z0000 Encounter for general adult medical examination without abnormal findings: Secondary | ICD-10-CM

## 2016-07-22 DIAGNOSIS — E1142 Type 2 diabetes mellitus with diabetic polyneuropathy: Secondary | ICD-10-CM

## 2016-07-22 DIAGNOSIS — I129 Hypertensive chronic kidney disease with stage 1 through stage 4 chronic kidney disease, or unspecified chronic kidney disease: Secondary | ICD-10-CM

## 2016-07-22 DIAGNOSIS — E1169 Type 2 diabetes mellitus with other specified complication: Secondary | ICD-10-CM | POA: Diagnosis not present

## 2016-07-22 DIAGNOSIS — Z794 Long term (current) use of insulin: Secondary | ICD-10-CM

## 2016-07-22 DIAGNOSIS — M7552 Bursitis of left shoulder: Secondary | ICD-10-CM | POA: Diagnosis not present

## 2016-07-22 DIAGNOSIS — N184 Chronic kidney disease, stage 4 (severe): Secondary | ICD-10-CM | POA: Diagnosis not present

## 2016-07-22 DIAGNOSIS — E1165 Type 2 diabetes mellitus with hyperglycemia: Secondary | ICD-10-CM | POA: Diagnosis not present

## 2016-07-22 DIAGNOSIS — E785 Hyperlipidemia, unspecified: Secondary | ICD-10-CM

## 2016-07-22 DIAGNOSIS — IMO0002 Reserved for concepts with insufficient information to code with codable children: Secondary | ICD-10-CM

## 2016-07-22 DIAGNOSIS — E039 Hypothyroidism, unspecified: Secondary | ICD-10-CM

## 2016-07-22 DIAGNOSIS — M62838 Other muscle spasm: Secondary | ICD-10-CM | POA: Diagnosis not present

## 2016-07-22 MED ORDER — MUPIROCIN CALCIUM 2 % EX CREA: 1.0000 "application " | TOPICAL_CREAM | Freq: Two times a day (BID) | CUTANEOUS | 1 refills | Status: DC

## 2016-07-22 NOTE — Patient Instructions (Addendum)
Health Maintenance  Topic Date Due  . OPHTHALMOLOGY EXAM  01/25/1951  . ZOSTAVAX  01/26/2000  . URINE MICROALBUMIN  09/10/2016  . HEMOGLOBIN A1C  01/20/2017  . FOOT EXAM  07/22/2017  . TETANUS/TDAP  01/20/2023  . INFLUENZA VACCINE  Completed  . PNA vac Low Risk Adult  Completed    Diabetic Retinopathy Diabetic retinopathy is a disease of the light-sensitive membrane at the back of the eye (retina). It is a complication of diabetes and a common cause of blindness. Early detection of the disease is key to keeping your eyes healthy.  CAUSES  Diabetic retinopathy is caused by blood sugar (glucose) levels that are too high over an extended period of time. High blood sugars cause damage to the small blood vessels of the retina, allowing blood to leak through the vessel walls. This causes visual impairment and eventually blindness. RISK FACTORS  High blood pressure.  Having diabetes for a long time.  Having poorly controlled blood sugars. SIGNS AND SYMPTOMS  In the early stages of diabetic retinopathy, there are often no symptoms. As the condition advances, symptoms may include:  Blurred vision. This is usually caused by a swelling due to abnormal blood glucose levels. The blurriness may go away when blood glucose levels return to normal.  Moving specks or dark spots (floaters) in your vision. These can be caused by a small retinal hemorrhage. A hemorrhage is bleeding from blood vessels.  Missing parts of your field of vision, such as things at the side. This can be caused by larger retinal hemorrhages.  Difficulty reading books or signs.  Double vision.  Pain in one or both eyes.  Feeling pressure in one or both eyes.  Trouble seeing straight lines. Straight lines do not look straight.  Redness of the eyes that does not go away. DIAGNOSIS  Your eye care specialist can detect changes in the blood vessels of your eye by putting drops in your eyes that enlarge (dilate) your pupils.  This allows your eye care specialist to get a good look at your retina to see if there are any changes that have occurred as a result of your diabetes. You should have your eyes examined once a year. TREATMENT  Your eye care specialist may use a special laser beam to seal the blood vessels of the retina and stop them from leaking. Early detection and treatment are important so that further damage to your eyes can be prevented. In addition, managing your blood sugars and keeping them in the target range can slow the progress of the disease. HOME CARE INSTRUCTIONS   Keep your blood pressure within your target range.  Keep your blood glucose levels within your target range.  Follow your health care provider's instructions regarding diet and other means for controlling your blood glucose levels.  Check your blood levels for glucose as recommended by your health care provider.  Keep regular appointments with your eye specialist. An eye specialist can usually see diabetic retinopathy developing long before it starts causing problems. In many cases, it can be treated to prevent complications from occurring. If you have diabetes, you should have your eyes checked at least every year. Your risk of retinopathy increases the longer you have the disease.  If you smoke, quit. Ask your health care provider for help if needed. Smoking can make retinopathy worse. SEEK MEDICAL CARE IF:   You notice gradual blurring or other changes in your vision over time.  You notice that your glasses or  contact lenses do not make things look as sharp as they once did.  You have trouble reading or seeing details at a distance with either eye.  You notice a sudden change in your vision or notice that parts of your field of vision appear missing or hazy.  You suddenly see moving specks or dark spots in the field of vision of either eye.  You have sudden partial loss of vision in either eye.   This information is not  intended to replace advice given to you by your health care provider. Make sure you discuss any questions you have with your health care provider.   Document Released: 09/27/2000 Document Revised: 07/21/2013 Document Reviewed: 03/22/2013 Elsevier Interactive Patient Education [employer].

## 2016-07-22 NOTE — Progress Notes (Signed)
Patient: [name], Male    DOB: 1941/04/14, 75 y.o.   MRN: [id number] Visit Date: 07/22/2016  Today's Provider: Halina Maidens, MD   Chief Complaint  Patient presents with  . Medicare Wellness  . Hypertension  . Diabetes  . Hyperlipidemia  . Hypothyroidism   Subjective:    Annual wellness visit [name] is a 75 y.o. male who presents today for his Subsequent Annual Wellness Visit. He feels well. He reports exercising walking. He reports he is sleeping well. Since he was here last he had emergency cholecystectomy for gangrenous gall bladder.  He has done well since surgery with only mild food intolerances.  ----------------------------------------------------------- Hypertension  This is a chronic problem. The current episode started more than 1 year ago. The problem has been rapidly improving since onset. The problem is controlled. Associated symptoms include neck pain. Pertinent negatives include no chest pain, headaches, palpitations or shortness of breath. Hypertensive end-organ damage includes a thyroid problem.  Diabetes  He presents for his follow-up diabetic visit. He has type 2 diabetes mellitus. His disease course has been fluctuating. Pertinent negatives for hypoglycemia include no dizziness or headaches. Pertinent negatives for diabetes include no chest pain, no fatigue, no polydipsia and no polyuria. There are no hypoglycemic complications. Symptoms are stable. 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 Current diabetic treatment includes intensive insulin program. He is compliant with treatment most of the time. His weight is stable. He is following a generally healthy diet. He has not had a previous visit with a dietitian. He monitors blood glucose at home 1-2 x per week. An ACE inhibitor/angiotensin II receptor blocker is not being taken (overdue to see Endocrinology). Eye exam is not current.  Hyperlipidemia  This is a chronic problem.  The problem is controlled. Recent lipid tests were reviewed and are normal. Pertinent negatives include no chest pain, myalgias or shortness of breath. Current antihyperlipidemic treatment includes statins. Risk factors for coronary artery disease include diabetes mellitus, hypertension and dyslipidemia.  Thyroid Problem  Presents for follow-up visit. Symptoms include diarrhea (with the wrong foods). Patient reports no constipation, diaphoresis, fatigue or palpitations. His past medical history is significant for hyperlipidemia.  Rash - pustular under both breasts and along sternum Neck stiffness - started about one week ago.  No known injury.  Painful to turn to the left.  Has not used any rubs, heat or ice. Shoulder pain - of left shoulder and anterior upper arm.  It has been off and on for a year or more.  There is no known injury.  He uses topical rubs with improvement.  He denies significant weakness - may have some decreased strength due to pain.  He is not ready to consult orthopedics.  Review of Systems  Constitutional: Negative for appetite change, chills, diaphoresis, fatigue and unexpected weight change.  HENT: Negative for hearing loss, tinnitus, trouble swallowing and voice change.   Eyes: Negative for visual disturbance.  Respiratory: Negative for choking, shortness of breath and wheezing.   Cardiovascular: Negative for chest pain, palpitations and leg swelling.  Gastrointestinal: Positive for diarrhea (with the wrong foods). Negative for abdominal pain, blood in stool and constipation.  Endocrine: Negative for polydipsia and polyuria.  Genitourinary: Negative for difficulty urinating, dysuria and frequency.  Musculoskeletal: Positive for arthralgias, neck pain and neck stiffness. Negative for back pain and myalgias.  Skin: Positive for rash. Negative for color change.  Neurological: Negative for dizziness, syncope and headaches.  Hematological: Negative for adenopathy.  Psychiatric/Behavioral: Negative for dysphoric mood and sleep disturbance.    Social History   Social History  . Marital status: Married    Spouse name: N/A  . Number of children: N/A  . Years of education: N/A   Occupational History  . Not on file.   Social History Main Topics  . Smoking status: Former Smoker    Types: Cigarettes    Quit date: 06/24/1996  . Smokeless tobacco: Never Used  . Alcohol use No  . Drug use: No  . Sexual activity: Not on file   Other Topics Concern  . Not on file   Social History Narrative  . No narrative on file    Patient Active Problem List   Diagnosis Date Noted  . Cholecystitis, acute 04/21/2016  . Uncontrolled type 2 diabetes mellitus with stage 4 chronic kidney disease, with long-term current use of insulin ([place]) 09/14/2015  . 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 CKD stage 4 due to type 2 diabetes mellitus ([place]) 03/16/2015  . Diabetes mellitus with polyneuropathy ([place]) 03/16/2015  . CAD in native artery 01/20/2015  . Gout 01/20/2015  . Diabetic peripheral neuropathy ([place]) 01/20/2015  . H/O malignant neoplasm of colon 01/20/2015  . Adult hypothyroidism 01/20/2015  . Hyperlipidemia associated with type 2 diabetes mellitus ([place]) 01/20/2015  . Encounter for long-term (current) use of insulin ([place]) 07/27/2014  . Disorder affecting the body's metabolism 07/27/2014  . High potassium 05/19/2014  . 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 Benign essential HTN 04/14/2014  . Hypertension in stage 4 chronic kidney disease due to type 2 diabetes mellitus ([place]) 07/15/2013    Past Surgical History:  Procedure Laterality Date  . APPENDECTOMY  2011  . CHOLECYSTECTOMY N/A 04/23/2016   Procedure: LAPAROSCOPIC CHOLECYSTECTOMY;  Surgeon: [name], MD;  Location: [facility] ORS;  Service: General;  Laterality: N/A;  . COLON SURGERY  2011   Colectomy for ileo-cecal valve cancer  . CORONARY ARTERY BYPASS  GRAFT  1997   x 3  . HERNIA REPAIR  2011   Ventral hernia  . KNEE SURGERY Left   . PROSTATE SURGERY  2002   BPH benign pathology  . SPINE SURGERY  1989   Lumbar disc    His family history includes Cancer in his mother; Diabetes in his mother; Heart disease in his mother.    Previous Medications   ASPIRIN EC 325 MG TABLET    Take 325 mg by mouth daily.   ATENOLOL (TENORMIN) 25 MG TABLET    TAKE ONE TABLET BY MOUTH ONCE DAILY   BLOOD GLUCOSE MONITORING SUPPL (RELION PRIME MONITOR) DEVI       GLUCOSE BLOOD (RELION PRIME TEST) TEST STRIP       INSULIN NPH HUMAN (HUMULIN N,NOVOLIN N) 100 UNIT/ML INJECTION    Inject 30 Units into the skin 2 (two) times daily.   INSULIN REGULAR (NOVOLIN R,HUMULIN R) 100 UNITS/ML INJECTION    Inject 15-16 Units into the skin 2 (two) times daily. Pt takes depending on blood sugar.   LEVOTHYROXINE (SYNTHROID, LEVOTHROID) 300 MCG TABLET    Take 300 mcg by mouth daily before breakfast.   SIMVASTATIN (ZOCOR) 20 MG TABLET    TAKE ONE TABLET BY MOUTH AT BEDTIME    Patient Care Team: Glean Hess, MD as PCP - General (Internal Medicine) Lloyd Huger, MD as Consulting Physician (Oncology) Teodoro Spray, MD as Consulting Physician (Cardiology) Elease Etienne, MD (Endocrinology) Justin Mend,  MD as Attending Physician (Nephrology)     Objective:   Vitals: BP 110/72   Pulse 68   Resp 16   Ht 6\' 2"  (1.88 m)   Wt 237 lb (107.5 kg)   SpO2 97%   BMI 30.43 kg/m   Physical Exam  Constitutional: He is oriented to person, place, and time. He appears well-developed and well-nourished.  HENT:  Head: Normocephalic.  Right Ear: Tympanic membrane, external ear and ear canal normal.  Left Ear: Tympanic membrane, external ear and ear canal normal.  Nose: Nose normal.  Mouth/Throat: Uvula is midline and oropharynx is clear and moist.  Eyes: Conjunctivae and EOM are normal. Pupils are equal, round, and reactive to light.  Neck: Normal range of motion.  Neck supple. Carotid bruit is not present. No thyromegaly present.  Cardiovascular: Normal rate, regular rhythm, normal heart sounds and intact distal pulses.   Pulmonary/Chest: Effort normal and breath sounds normal. He has no wheezes. Right breast exhibits no mass. Left breast exhibits no mass.  Abdominal: Soft. Bowel sounds are normal. There is no hepatosplenomegaly. There is no tenderness. There is no rebound.  Musculoskeletal:       Left shoulder: He exhibits decreased range of motion and tenderness.       Cervical back: He exhibits decreased range of motion, tenderness and spasm.  Lymphadenopathy:    He has no cervical adenopathy.  Neurological: He is alert and oriented to person, place, and time. He has normal strength and normal reflexes. No cranial nerve deficit.  Foot exam - normal skin, nails, pulses bilaterally.  Normal sensation on left.  Mild decrease to light touch ball of right foot   Skin: Skin is warm, dry and intact.  Follicular rash over chest and sternum  Psychiatric: He has a normal mood and affect. His speech is normal and behavior is normal. Judgment and thought content normal. Cognition and memory are normal.  Nursing note and vitals reviewed.   Activities of Daily Living In your present state of health, do you have any difficulty performing the following activities: 07/22/2016 04/21/2016  Hearing? N N  Vision? N N  Difficulty concentrating or making decisions? N N  Walking or climbing stairs? N N  Dressing or bathing? N N  Doing errands, shopping? N N  Preparing Food and eating ? N -  Using the Toilet? N -  In the past six months, have you accidently leaked urine? N -  Do you have problems with loss of bowel control? N -  Managing your Medications? N -  Managing your Finances? N -  Housekeeping or managing your Housekeeping? N -  Some recent data might be hidden    Fall Risk Assessment Fall Risk  07/22/2016 03/19/2016 03/16/2015  Falls in the past year? No No  No      Depression Screen PHQ 2/9 Scores 07/22/2016 03/19/2016 03/16/2015  PHQ - 2 Score 0 0 0    Cognitive Testing - 6-CIT   Correct? Score   What year is it? yes 0 Yes = 0    No = 4  What month is it? yes 0 Yes = 0    No = 3  Remember:     [address]ia [address], [place], [place]     What time is it? yes 0 Yes = 0    No = 3  Count backwards from 20 to 1 yes 0 Correct = 0    1 error = 2   More  than 1 error = 4  Say the months of the year in reverse. yes 0 Correct = 0    1 error = 2   More than 1 error = 4  What address did I ask you to remember? yes 0 Correct = 0  1 error = 2    2 error = 4    3 error = 6    4 error = 8    All wrong = 10       TOTAL SCORE  0/28   Interpretation:  Normal  Normal (0-7) Abnormal (8-28)        Medicare Annual Wellness Visit Summary:  Reviewed patient's Family Medical History Reviewed and updated list of patient's medical providers Assessment of cognitive impairment was done Assessed patient's functional ability Established a written schedule for health screening [place] Completed and Reviewed  Exercise Activities and Dietary recommendations Goals    . Finances          Get truck on road again and get ALL finances caught up.        Immunization History  Administered Date(s) Administered  . Influenza,inj,Quad PF,36+ Mos 09/14/2015  . Pneumococcal Conjugate-13 03/19/2016  . Pneumococcal Polysaccharide-23 03/09/2013  . Tdap 01/19/2013    Health Maintenance  Topic Date Due  . OPHTHALMOLOGY EXAM  01/25/1950  . ZOSTAVAX  01/26/2000  . URINE MICROALBUMIN  09/10/2016  . HEMOGLOBIN A1C  01/20/2017  . FOOT EXAM  07/22/2017  . TETANUS/TDAP  01/20/2023  . INFLUENZA VACCINE  Completed  . PNA vac Low Risk Adult  Completed     Discussed health benefits of physical activity, and encouraged him to engage in regular exercise appropriate for his age and condition.     ------------------------------------------------------------------------------------------------------------   Assessment & Plan:  1. Medicare annual wellness visit, subsequent Measures satisfied Rx given to get Zostavax  2. Hypertension in stage 4 chronic kidney disease due to type 2 diabetes mellitus ([place]) controlled - CBC with Differential/Platelet  3. Uncontrolled type 2 diabetes mellitus with stage 4 chronic kidney disease, with long-term current use of insulin ([place]) Followed by Endocrinology but has not been seen in 6 months - Comprehensive metabolic panel - Hemoglobin A1c - Microalbumin / creatinine urine ratio  4. Hyperlipidemia associated with type 2 diabetes mellitus ([place]) On statin therapy - Lipid panel  5. Diabetic polyneuropathy associated with type 2 diabetes mellitus ([facility]) Appears mild, stable  6. Adult hypothyroidism  supplemented - TSH  7. Prostate cancer screening - PSA  8. Need for influenza vaccination - Flu Vaccine QUAD 36+ mos IM  9. Folliculitis - mupirocin cream (BACTROBAN) 2 %; Apply 1 application topically 2 (two) times daily.  Dispense: 30 g; Refill: 1  10. Neck muscle spasm Use topical rubs and alternate with heat  11. Chronic shoulder bursitis, left Continue topical rubs; patient is not ready to consult Orthopedics   Halina Maidens, MD [facility]  07/22/2016

## 2016-07-23 LAB — LIPID PANEL
CHOLESTEROL TOTAL: 125 mg/dL (ref 100–199)
Chol/HDL Ratio: 6 ratio units — ABNORMAL HIGH (ref 0.0–5.0)
HDL: 21 mg/dL — ABNORMAL LOW (ref >39)
LDL Calculated: 56 mg/dL (ref 0–99)
TRIGLYCERIDES: 240 mg/dL — AB (ref 0–149)
VLDL CHOLESTEROL CAL: 48 mg/dL — AB (ref 5–40)

## 2016-07-23 LAB — CBC WITH DIFFERENTIAL/PLATELET
BASOS: 0 %
Basophils Absolute: 0 10*3/uL (ref 0.0–0.2)
EOS (ABSOLUTE): 0.2 10*3/uL (ref 0.0–0.4)
Eos: 2 %
Hematocrit: 44.1 % (ref 37.5–51.0)
Hemoglobin: 14.6 g/dL (ref 12.6–17.7)
IMMATURE GRANULOCYTES: 0 %
Immature Grans (Abs): 0 10*3/uL (ref 0.0–0.1)
LYMPHS ABS: 4.1 10*3/uL — AB (ref 0.7–3.1)
Lymphs: 44 %
MCH: 31.1 pg (ref 26.6–33.0)
MCHC: 33.1 g/dL (ref 31.5–35.7)
MCV: 94 fL (ref 79–97)
MONOS ABS: 0.8 10*3/uL (ref 0.1–0.9)
Monocytes: 8 %
NEUTROS PCT: 46 %
Neutrophils Absolute: 4.1 10*3/uL (ref 1.4–7.0)
PLATELETS: 154 10*3/uL (ref 150–379)
RBC: 4.7 x10E6/uL (ref 4.14–5.80)
RDW: 14.2 % (ref 12.3–15.4)
WBC: 9.2 10*3/uL (ref 3.4–10.8)

## 2016-07-23 LAB — COMPREHENSIVE METABOLIC PANEL
ALBUMIN: 3.7 g/dL (ref 3.5–4.8)
ALK PHOS: 137 IU/L — AB (ref 39–117)
ALT: 29 IU/L (ref 0–44)
AST: 26 IU/L (ref 0–40)
Albumin/Globulin Ratio: 1.3 (ref 1.2–2.2)
BILIRUBIN TOTAL: 0.4 mg/dL (ref 0.0–1.2)
BUN / CREAT RATIO: 12 (ref 10–24)
BUN: 31 mg/dL — AB (ref 8–27)
CHLORIDE: 103 mmol/L (ref 96–106)
CO2: 20 mmol/L (ref 18–29)
Calcium: 8.8 mg/dL (ref 8.6–10.2)
Creatinine, Ser: 2.56 mg/dL — ABNORMAL HIGH (ref 0.76–1.27)
GFR calc Af Amer: 27 mL/min/{1.73_m2} — ABNORMAL LOW (ref >59)
GFR calc non Af Amer: 23 mL/min/{1.73_m2} — ABNORMAL LOW (ref >59)
GLUCOSE: 112 mg/dL — AB (ref 65–99)
Globulin, Total: 2.9 g/dL (ref 1.5–4.5)
Potassium: 5.4 mmol/L — ABNORMAL HIGH (ref 3.5–5.2)
Sodium: 140 mmol/L (ref 134–144)
Total Protein: 6.6 g/dL (ref 6.0–8.5)

## 2016-07-23 LAB — PSA

## 2016-07-23 LAB — TSH: TSH: 1.82 u[IU]/mL (ref 0.450–4.500)

## 2016-07-23 LAB — HEMOGLOBIN A1C
Est. average glucose Bld gHb Est-mCnc: 183 mg/dL
Hgb A1c MFr Bld: 8 % — ABNORMAL HIGH (ref 4.8–5.6)

## 2016-07-24 ENCOUNTER — Other Ambulatory Visit: Payer: Self-pay | Admitting: Internal Medicine

## 2016-07-24 LAB — MICROALBUMIN / CREATININE URINE RATIO
Creatinine, Urine: 76.9 mg/dL
MICROALBUM., U, RANDOM: 2100.5 ug/mL
Microalb/Creat Ratio: 2731.5 mg/g creat — ABNORMAL HIGH (ref 0.0–30.0)

## 2016-07-24 MED ORDER — LOSARTAN POTASSIUM 25 MG PO TABS: 25.0000 mg | ORAL_TABLET | Freq: Every day | ORAL | 5 refills | Status: DC

## 2016-08-02 ENCOUNTER — Other Ambulatory Visit: Payer: Self-pay | Admitting: Internal Medicine

## 2016-08-08 DIAGNOSIS — E038 Other specified hypothyroidism: Secondary | ICD-10-CM | POA: Diagnosis not present

## 2016-08-08 DIAGNOSIS — E1165 Type 2 diabetes mellitus with hyperglycemia: Secondary | ICD-10-CM | POA: Diagnosis not present

## 2016-08-08 DIAGNOSIS — E669 Obesity, unspecified: Secondary | ICD-10-CM | POA: Diagnosis not present

## 2016-08-08 DIAGNOSIS — E1122 Type 2 diabetes mellitus with diabetic chronic kidney disease: Secondary | ICD-10-CM | POA: Diagnosis not present

## 2016-08-08 DIAGNOSIS — E11649 Type 2 diabetes mellitus with hypoglycemia without coma: Secondary | ICD-10-CM | POA: Diagnosis not present

## 2016-08-08 DIAGNOSIS — E1142 Type 2 diabetes mellitus with diabetic polyneuropathy: Secondary | ICD-10-CM | POA: Diagnosis not present

## 2016-08-08 DIAGNOSIS — E063 Autoimmune thyroiditis: Secondary | ICD-10-CM | POA: Diagnosis not present

## 2016-08-08 DIAGNOSIS — N184 Chronic kidney disease, stage 4 (severe): Secondary | ICD-10-CM | POA: Diagnosis not present

## 2016-08-08 DIAGNOSIS — Z794 Long term (current) use of insulin: Secondary | ICD-10-CM | POA: Diagnosis not present

## 2016-08-26 DIAGNOSIS — E6609 Other obesity due to excess calories: Secondary | ICD-10-CM | POA: Diagnosis not present

## 2016-08-26 DIAGNOSIS — Z6830 Body mass index (BMI) 30.0-30.9, adult: Secondary | ICD-10-CM | POA: Diagnosis not present

## 2016-08-26 DIAGNOSIS — N400 Enlarged prostate without lower urinary tract symptoms: Secondary | ICD-10-CM | POA: Diagnosis not present

## 2016-08-26 DIAGNOSIS — E875 Hyperkalemia: Secondary | ICD-10-CM | POA: Diagnosis not present

## 2016-08-26 DIAGNOSIS — M1A9XX0 Chronic gout, unspecified, without tophus (tophi): Secondary | ICD-10-CM | POA: Diagnosis not present

## 2016-08-26 DIAGNOSIS — I10 Essential (primary) hypertension: Secondary | ICD-10-CM | POA: Diagnosis not present

## 2016-08-26 DIAGNOSIS — N184 Chronic kidney disease, stage 4 (severe): Secondary | ICD-10-CM | POA: Diagnosis not present

## 2016-11-07 DIAGNOSIS — E1142 Type 2 diabetes mellitus with diabetic polyneuropathy: Secondary | ICD-10-CM | POA: Diagnosis not present

## 2016-11-07 DIAGNOSIS — E1122 Type 2 diabetes mellitus with diabetic chronic kidney disease: Secondary | ICD-10-CM | POA: Diagnosis not present

## 2016-11-07 DIAGNOSIS — E1165 Type 2 diabetes mellitus with hyperglycemia: Secondary | ICD-10-CM | POA: Diagnosis not present

## 2016-11-07 DIAGNOSIS — N184 Chronic kidney disease, stage 4 (severe): Secondary | ICD-10-CM | POA: Diagnosis not present

## 2016-11-07 DIAGNOSIS — M25512 Pain in left shoulder: Secondary | ICD-10-CM | POA: Diagnosis not present

## 2016-11-07 DIAGNOSIS — E038 Other specified hypothyroidism: Secondary | ICD-10-CM | POA: Diagnosis not present

## 2016-11-07 DIAGNOSIS — Z794 Long term (current) use of insulin: Secondary | ICD-10-CM | POA: Diagnosis not present

## 2016-11-07 DIAGNOSIS — G8929 Other chronic pain: Secondary | ICD-10-CM | POA: Diagnosis not present

## 2016-11-07 DIAGNOSIS — E063 Autoimmune thyroiditis: Secondary | ICD-10-CM | POA: Diagnosis not present

## 2016-11-21 DIAGNOSIS — M25512 Pain in left shoulder: Secondary | ICD-10-CM | POA: Diagnosis not present

## 2016-11-21 DIAGNOSIS — M7542 Impingement syndrome of left shoulder: Secondary | ICD-10-CM | POA: Diagnosis not present

## 2016-12-24 ENCOUNTER — Ambulatory Visit (INDEPENDENT_AMBULATORY_CARE_PROVIDER_SITE_OTHER): Payer: PPO | Admitting: Internal Medicine

## 2016-12-24 ENCOUNTER — Ambulatory Visit
Admission: RE | Admit: 2016-12-24 | Discharge: 2016-12-24 | Disposition: A | Discharge status: Home / Self Care | Payer: PPO | Source: Ambulatory Visit | Attending: Internal Medicine | Admitting: Internal Medicine

## 2016-12-24 ENCOUNTER — Encounter: Payer: Self-pay | Admitting: Internal Medicine

## 2016-12-24 VITALS — BP 112/60 | HR 98 | Temp 97.3°F | Ht 74.0 in | Wt 245.0 lb

## 2016-12-24 DIAGNOSIS — Z8249 Family history of ischemic heart disease and other diseases of the circulatory system: Secondary | ICD-10-CM | POA: Diagnosis not present

## 2016-12-24 DIAGNOSIS — Z87891 Personal history of nicotine dependence: Secondary | ICD-10-CM | POA: Diagnosis not present

## 2016-12-24 DIAGNOSIS — Z801 Family history of malignant neoplasm of trachea, bronchus and lung: Secondary | ICD-10-CM | POA: Diagnosis not present

## 2016-12-24 DIAGNOSIS — E1122 Type 2 diabetes mellitus with diabetic chronic kidney disease: Secondary | ICD-10-CM | POA: Diagnosis not present

## 2016-12-24 DIAGNOSIS — R1110 Vomiting, unspecified: Secondary | ICD-10-CM | POA: Diagnosis not present

## 2016-12-24 DIAGNOSIS — R1114 Bilious vomiting: Secondary | ICD-10-CM | POA: Diagnosis not present

## 2016-12-24 DIAGNOSIS — Z951 Presence of aortocoronary bypass graft: Secondary | ICD-10-CM | POA: Insufficient documentation

## 2016-12-24 DIAGNOSIS — E785 Hyperlipidemia, unspecified: Secondary | ICD-10-CM | POA: Diagnosis not present

## 2016-12-24 DIAGNOSIS — I2510 Atherosclerotic heart disease of native coronary artery without angina pectoris: Secondary | ICD-10-CM | POA: Diagnosis not present

## 2016-12-24 DIAGNOSIS — N184 Chronic kidney disease, stage 4 (severe): Secondary | ICD-10-CM | POA: Diagnosis not present

## 2016-12-24 DIAGNOSIS — E079 Disorder of thyroid, unspecified: Secondary | ICD-10-CM | POA: Diagnosis not present

## 2016-12-24 DIAGNOSIS — Z888 Allergy status to other drugs, medicaments and biological substances status: Secondary | ICD-10-CM | POA: Diagnosis not present

## 2016-12-24 DIAGNOSIS — Z9049 Acquired absence of other specified parts of digestive tract: Secondary | ICD-10-CM | POA: Diagnosis not present

## 2016-12-24 DIAGNOSIS — R0989 Other specified symptoms and signs involving the circulatory and respiratory systems: Secondary | ICD-10-CM

## 2016-12-24 DIAGNOSIS — E875 Hyperkalemia: Secondary | ICD-10-CM | POA: Diagnosis not present

## 2016-12-24 DIAGNOSIS — E860 Dehydration: Secondary | ICD-10-CM | POA: Diagnosis not present

## 2016-12-24 DIAGNOSIS — Z66 Do not resuscitate: Secondary | ICD-10-CM | POA: Diagnosis not present

## 2016-12-24 DIAGNOSIS — Z794 Long term (current) use of insulin: Secondary | ICD-10-CM | POA: Diagnosis not present

## 2016-12-24 DIAGNOSIS — E1165 Type 2 diabetes mellitus with hyperglycemia: Secondary | ICD-10-CM | POA: Diagnosis not present

## 2016-12-24 DIAGNOSIS — I248 Other forms of acute ischemic heart disease: Secondary | ICD-10-CM | POA: Diagnosis not present

## 2016-12-24 DIAGNOSIS — R112 Nausea with vomiting, unspecified: Secondary | ICD-10-CM | POA: Diagnosis not present

## 2016-12-24 DIAGNOSIS — E1151 Type 2 diabetes mellitus with diabetic peripheral angiopathy without gangrene: Secondary | ICD-10-CM | POA: Diagnosis not present

## 2016-12-24 DIAGNOSIS — Z85038 Personal history of other malignant neoplasm of large intestine: Secondary | ICD-10-CM | POA: Diagnosis not present

## 2016-12-24 DIAGNOSIS — I129 Hypertensive chronic kidney disease with stage 1 through stage 4 chronic kidney disease, or unspecified chronic kidney disease: Secondary | ICD-10-CM | POA: Diagnosis not present

## 2016-12-24 DIAGNOSIS — I700 Atherosclerosis of aorta: Secondary | ICD-10-CM

## 2016-12-24 DIAGNOSIS — N179 Acute kidney failure, unspecified: Secondary | ICD-10-CM | POA: Diagnosis not present

## 2016-12-24 DIAGNOSIS — Z833 Family history of diabetes mellitus: Secondary | ICD-10-CM | POA: Diagnosis not present

## 2016-12-24 NOTE — Patient Instructions (Signed)
Dexilant 60 mg - take one per day (sample)  Increase fluid intake - clear liquids, water, gatorade, tea

## 2016-12-24 NOTE — Progress Notes (Signed)
Date:  12/24/2016   Name:  [name]   DOB:  July 12, 1941   MRN:  [id number]   Chief Complaint: Gastroesophageal Reflux (X 2 weeks. Burning in chest, and vomiting. Can't keep food down. Pepto helps, but doesn't take it completely away. Nausea meds are not helping enough. Had Gall Bladder removed last July.) He has not been drinking much fluids due to nausea.  He has not been eating.  The diarrhea has stopped but he still has emesis 1-2 times per day - contains bile and food but no blood or coffee grounds. Urgent care did a CXR but did not comment.  They discharged him with dx of influenza and gave him phenergan and Doxycycline.   Gastroesophageal Reflux  He complains of belching, chest pain, coughing, early satiety, heartburn, nausea and wheezing. He reports no abdominal pain. This is a new problem. The current episode started 1 to 4 weeks ago. Associated symptoms include fatigue.  Emesis   This is a new problem. The current episode started 1 to 4 weeks ago. The problem occurs less than 2 times per day. The emesis has an appearance of bile and stomach contents. There has been no fever. Associated symptoms include chest pain and coughing. Pertinent negatives include no abdominal pain, diarrhea or fever.     Review of Systems  Constitutional: Positive for appetite change and fatigue. Negative for fever.  Eyes: Negative for visual disturbance.  Respiratory: Positive for cough and wheezing. Negative for chest tightness and shortness of breath.   Cardiovascular: Positive for chest pain. Negative for palpitations and leg swelling.  Gastrointestinal: Positive for heartburn, nausea and vomiting. Negative for abdominal pain, blood in stool and diarrhea.       Acid burning in epigastric region  Genitourinary: Negative for dysuria and hematuria.  Neurological: Positive for weakness.  Psychiatric/Behavioral: Negative for dysphoric mood and sleep disturbance.    Patient Active Problem List   Diagnosis Date Noted  . Chronic shoulder bursitis, left 07/22/2016  . Cholecystitis, acute 04/21/2016  . Uncontrolled type 2 diabetes mellitus with stage 4 chronic kidney disease, with long-term current use of insulin ([place]) 09/14/2015  . 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 CKD stage 4 due to type 2 diabetes mellitus ([place]) 03/16/2015  . Diabetes mellitus with polyneuropathy ([place]) 03/16/2015  . CAD in native artery 01/20/2015  . Gout 01/20/2015  . Diabetic peripheral neuropathy ([place]) 01/20/2015  . H/O malignant neoplasm of colon 01/20/2015  . Adult hypothyroidism 01/20/2015  . Hyperlipidemia associated with type 2 diabetes mellitus ([place]) 01/20/2015  . Encounter for long-term (current) use of insulin ([place]) 07/27/2014  . Disorder affecting the body's metabolism 07/27/2014  . High potassium 05/19/2014  . 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 Benign essential HTN 04/14/2014  . Hypertension in stage 4 chronic kidney disease due to type 2 diabetes mellitus ([place]) 07/15/2013    Prior to Admission medications   Medication Sig Start Date End Date Taking? Authorizing Provider  aspirin EC 325 MG tablet Take 325 mg by mouth daily.   Yes Historical Provider, MD  atenolol (TENORMIN) 25 MG tablet TAKE ONE TABLET BY MOUTH ONCE DAILY 06/16/16  Yes Glean Hess, MD  Blood Glucose Monitoring Suppl ([place]) [place]  01/29/14  Yes Historical Provider, MD  glucose blood (RELION PRIME TEST) test strip  02/20/15  Yes Historical Provider, MD  insulin NPH Human (HUMULIN N,NOVOLIN N) 100 UNIT/ML injection Inject 30 Units into the skin  2 (two) times daily.   Yes Historical Provider, MD  insulin regular (NOVOLIN R,HUMULIN R) 100 units/mL injection Inject 15-16 Units into the skin 2 (two) times daily. Pt takes depending on blood sugar.   Yes Historical Provider, MD  levothyroxine (SYNTHROID, LEVOTHROID) 300 MCG tablet Take 300 mcg by mouth daily before breakfast.    Yes Historical Provider, MD  minocycline (MINOCIN,DYNACIN) 100 MG capsule Take 100 mg by mouth 2 (two) times daily.   Yes Historical Provider, MD  mupirocin cream (BACTROBAN) 2 % Apply 1 application topically 2 (two) times daily. 07/22/16  Yes Glean Hess, MD  promethazine (PHENERGAN) 25 MG tablet Take 25 mg by mouth every 6 (six) hours as needed for nausea or vomiting.   Yes Historical Provider, MD  simvastatin (ZOCOR) 20 MG tablet TAKE ONE TABLET BY MOUTH AT BEDTIME 08/02/16  Yes Glean Hess, MD    Allergies  Allergen Reactions  . Ace Inhibitors Other (See Comments)    Reaction:  Raises potassium   . Quinapril Rash    hyperkalemia    Past Surgical History:  Procedure Laterality Date  . APPENDECTOMY  2011  . CHOLECYSTECTOMY N/A 04/23/2016   Procedure: LAPAROSCOPIC CHOLECYSTECTOMY;  Surgeon: [name], MD;  Location: [facility] ORS;  Service: General;  Laterality: N/A;  . COLON SURGERY  2011   Colectomy for ileo-cecal valve cancer  . CORONARY ARTERY BYPASS GRAFT  1997   x 3  . HERNIA REPAIR  2011   Ventral hernia  . KNEE SURGERY Left   . PROSTATE SURGERY  2002   BPH benign pathology  . SPINE SURGERY  1989   Lumbar disc    Social History  Substance Use Topics  . Smoking status: Former Smoker    Types: Cigarettes    Quit date: 06/24/1996  . Smokeless tobacco: Never Used  . Alcohol use No     Medication list has been reviewed and updated.   Physical Exam  Constitutional: He is oriented to person, place, and time. He appears well-developed. He has a sickly appearance. No distress.  HENT:  Head: Normocephalic and atraumatic.  Cardiovascular: Normal rate, regular rhythm and normal heart sounds.   Pulmonary/Chest: Effort normal. No respiratory distress. He has no wheezes. He has rhonchi in the left lower field.  Abdominal: Soft. Normal appearance and bowel sounds are normal. There is no tenderness. There is no CVA tenderness.  Musculoskeletal: Normal range of  motion.  Neurological: He is alert and oriented to person, place, and time.  Skin: Skin is warm and dry. No rash noted.  Poor skin turgor  Psychiatric: He has a normal mood and affect. His speech is normal and behavior is normal. Thought content normal.  Nursing note and vitals reviewed.   BP 112/60   Pulse 98   Temp 97.3 F (36.3 C)   Ht 6\' 2"  (1.88 m)   Wt 245 lb (111.1 kg)   SpO2 98%   BMI 31.46 kg/m   Assessment and Plan: 1. Abnormal lung sounds May need antibiotics or hospital care Will allow CXR and CBC to guide treatment - DG Chest 2 View; Future  2. Bilious vomiting with nausea Continue phenergan Increase intake of clear liquids Dexilant 60 mg once a day (samples) - CBC with Differential/Platelet - Comprehensive metabolic panel   No orders of the defined types were placed in this encounter.   Halina Maidens, MD [facility]  12/24/2016

## 2016-12-25 ENCOUNTER — Emergency Department: Payer: PPO

## 2016-12-25 ENCOUNTER — Inpatient Hospital Stay
Admission: EM | Admit: 2016-12-25 | Discharge: 2016-12-26 | DRG: 638 | Disposition: A | Discharge status: Home / Self Care | Payer: PPO | Attending: Internal Medicine | Admitting: Internal Medicine

## 2016-12-25 ENCOUNTER — Encounter: Payer: Self-pay | Admitting: Emergency Medicine

## 2016-12-25 DIAGNOSIS — I700 Atherosclerosis of aorta: Secondary | ICD-10-CM | POA: Diagnosis not present

## 2016-12-25 DIAGNOSIS — Z8249 Family history of ischemic heart disease and other diseases of the circulatory system: Secondary | ICD-10-CM | POA: Diagnosis not present

## 2016-12-25 DIAGNOSIS — E860 Dehydration: Secondary | ICD-10-CM | POA: Diagnosis present

## 2016-12-25 DIAGNOSIS — R1013 Epigastric pain: Secondary | ICD-10-CM | POA: Diagnosis not present

## 2016-12-25 DIAGNOSIS — I248 Other forms of acute ischemic heart disease: Secondary | ICD-10-CM | POA: Diagnosis not present

## 2016-12-25 DIAGNOSIS — Z951 Presence of aortocoronary bypass graft: Secondary | ICD-10-CM | POA: Diagnosis not present

## 2016-12-25 DIAGNOSIS — Z794 Long term (current) use of insulin: Secondary | ICD-10-CM

## 2016-12-25 DIAGNOSIS — I2510 Atherosclerotic heart disease of native coronary artery without angina pectoris: Secondary | ICD-10-CM | POA: Diagnosis present

## 2016-12-25 DIAGNOSIS — E785 Hyperlipidemia, unspecified: Secondary | ICD-10-CM | POA: Diagnosis present

## 2016-12-25 DIAGNOSIS — R739 Hyperglycemia, unspecified: Secondary | ICD-10-CM | POA: Diagnosis not present

## 2016-12-25 DIAGNOSIS — R109 Unspecified abdominal pain: Secondary | ICD-10-CM | POA: Diagnosis not present

## 2016-12-25 DIAGNOSIS — E079 Disorder of thyroid, unspecified: Secondary | ICD-10-CM | POA: Diagnosis present

## 2016-12-25 DIAGNOSIS — Z833 Family history of diabetes mellitus: Secondary | ICD-10-CM

## 2016-12-25 DIAGNOSIS — R0989 Other specified symptoms and signs involving the circulatory and respiratory systems: Secondary | ICD-10-CM | POA: Diagnosis not present

## 2016-12-25 DIAGNOSIS — I10 Essential (primary) hypertension: Secondary | ICD-10-CM | POA: Diagnosis not present

## 2016-12-25 DIAGNOSIS — Z85038 Personal history of other malignant neoplasm of large intestine: Secondary | ICD-10-CM | POA: Diagnosis not present

## 2016-12-25 DIAGNOSIS — E1165 Type 2 diabetes mellitus with hyperglycemia: Secondary | ICD-10-CM | POA: Diagnosis not present

## 2016-12-25 DIAGNOSIS — E875 Hyperkalemia: Secondary | ICD-10-CM | POA: Diagnosis present

## 2016-12-25 DIAGNOSIS — N179 Acute kidney failure, unspecified: Secondary | ICD-10-CM | POA: Diagnosis not present

## 2016-12-25 DIAGNOSIS — Z66 Do not resuscitate: Secondary | ICD-10-CM | POA: Diagnosis not present

## 2016-12-25 DIAGNOSIS — E1151 Type 2 diabetes mellitus with diabetic peripheral angiopathy without gangrene: Secondary | ICD-10-CM | POA: Diagnosis not present

## 2016-12-25 DIAGNOSIS — Z9049 Acquired absence of other specified parts of digestive tract: Secondary | ICD-10-CM

## 2016-12-25 DIAGNOSIS — I129 Hypertensive chronic kidney disease with stage 1 through stage 4 chronic kidney disease, or unspecified chronic kidney disease: Secondary | ICD-10-CM | POA: Diagnosis present

## 2016-12-25 DIAGNOSIS — E1122 Type 2 diabetes mellitus with diabetic chronic kidney disease: Secondary | ICD-10-CM | POA: Diagnosis present

## 2016-12-25 DIAGNOSIS — N184 Chronic kidney disease, stage 4 (severe): Secondary | ICD-10-CM | POA: Diagnosis present

## 2016-12-25 DIAGNOSIS — R112 Nausea with vomiting, unspecified: Secondary | ICD-10-CM | POA: Diagnosis not present

## 2016-12-25 DIAGNOSIS — Z87891 Personal history of nicotine dependence: Secondary | ICD-10-CM | POA: Diagnosis not present

## 2016-12-25 DIAGNOSIS — Z79899 Other long term (current) drug therapy: Secondary | ICD-10-CM

## 2016-12-25 DIAGNOSIS — Z888 Allergy status to other drugs, medicaments and biological substances status: Secondary | ICD-10-CM | POA: Diagnosis not present

## 2016-12-25 DIAGNOSIS — Z7189 Other specified counseling: Secondary | ICD-10-CM | POA: Diagnosis not present

## 2016-12-25 DIAGNOSIS — Z801 Family history of malignant neoplasm of trachea, bronchus and lung: Secondary | ICD-10-CM

## 2016-12-25 DIAGNOSIS — Z7982 Long term (current) use of aspirin: Secondary | ICD-10-CM

## 2016-12-25 LAB — CBC WITH DIFFERENTIAL/PLATELET
BASOS ABS: 0.1 10*3/uL (ref 0.0–0.2)
BASOS: 1 %
EOS (ABSOLUTE): 0.1 10*3/uL (ref 0.0–0.4)
Eos: 1 %
Hematocrit: 51.1 % — ABNORMAL HIGH (ref 37.5–51.0)
Hemoglobin: 16.6 g/dL (ref 13.0–17.7)
IMMATURE GRANS (ABS): 0 10*3/uL (ref 0.0–0.1)
Immature Granulocytes: 0 %
Lymphocytes Absolute: 2.8 10*3/uL (ref 0.7–3.1)
Lymphs: 31 %
MCH: 31.5 pg (ref 26.6–33.0)
MCHC: 32.5 g/dL (ref 31.5–35.7)
MCV: 97 fL (ref 79–97)
MONOS ABS: 0.4 10*3/uL (ref 0.1–0.9)
Monocytes: 4 %
NEUTROS ABS: 5.7 10*3/uL (ref 1.4–7.0)
Neutrophils: 63 %
PLATELETS: 171 10*3/uL (ref 150–379)
RBC: 5.27 x10E6/uL (ref 4.14–5.80)
RDW: 14.3 % (ref 12.3–15.4)
WBC: 9.1 10*3/uL (ref 3.4–10.8)

## 2016-12-25 LAB — COMPREHENSIVE METABOLIC PANEL
ALBUMIN: 3.2 g/dL — AB (ref 3.5–4.8)
ALBUMIN: 2.7 g/dL — AB (ref 3.5–5.0)
ALT: 19 IU/L (ref 0–44)
ALT: 21 U/L (ref 17–63)
ANION GAP: 7 (ref 5–15)
AST: 11 IU/L (ref 0–40)
AST: 20 U/L (ref 15–41)
Albumin/Globulin Ratio: 1 — ABNORMAL LOW (ref 1.2–2.2)
Alkaline Phosphatase: 162 IU/L — ABNORMAL HIGH (ref 39–117)
Alkaline Phosphatase: 132 U/L — ABNORMAL HIGH (ref 38–126)
BILIRUBIN TOTAL: 0.5 mg/dL (ref 0.3–1.2)
BUN / CREAT RATIO: 14 (ref 10–24)
BUN: 50 mg/dL — AB (ref 8–27)
BUN: 63 mg/dL — AB (ref 6–20)
Bilirubin Total: 0.3 mg/dL (ref 0.0–1.2)
CHLORIDE: 103 mmol/L (ref 101–111)
CO2: 16 mmol/L — ABNORMAL LOW (ref 18–29)
CO2: 21 mmol/L — AB (ref 22–32)
CREATININE: 3.51 mg/dL — AB (ref 0.76–1.27)
Calcium: 8.3 mg/dL — ABNORMAL LOW (ref 8.6–10.2)
Calcium: 8.3 mg/dL — ABNORMAL LOW (ref 8.9–10.3)
Chloride: 98 mmol/L (ref 96–106)
Creatinine, Ser: 3.69 mg/dL — ABNORMAL HIGH (ref 0.61–1.24)
GFR calc Af Amer: 17 mL/min — ABNORMAL LOW (ref >60)
GFR calc non Af Amer: 15 mL/min — ABNORMAL LOW (ref >60)
GFR, EST AFRICAN AMERICAN: 18 mL/min/{1.73_m2} — AB (ref >59)
GFR, EST NON AFRICAN AMERICAN: 16 mL/min/{1.73_m2} — AB (ref >59)
GLUCOSE: 492 mg/dL — AB (ref 65–99)
GLUCOSE: 506 mg/dL — AB (ref 65–99)
Globulin, Total: 3.1 g/dL (ref 1.5–4.5)
POTASSIUM: 5.2 mmol/L — AB (ref 3.5–5.1)
Potassium: 5.6 mmol/L — ABNORMAL HIGH (ref 3.5–5.2)
SODIUM: 131 mmol/L — AB (ref 135–145)
Sodium: 132 mmol/L — ABNORMAL LOW (ref 134–144)
TOTAL PROTEIN: 6.3 g/dL (ref 6.0–8.5)
TOTAL PROTEIN: 6.3 g/dL — AB (ref 6.5–8.1)

## 2016-12-25 LAB — CBC
HEMATOCRIT: 47.2 % (ref 40.0–52.0)
HEMOGLOBIN: 16 g/dL (ref 13.0–18.0)
MCH: 32.1 pg (ref 26.0–34.0)
MCHC: 34 g/dL (ref 32.0–36.0)
MCV: 94.4 fL (ref 80.0–100.0)
Platelets: 138 10*3/uL — ABNORMAL LOW (ref 150–440)
RBC: 5 MIL/uL (ref 4.40–5.90)
RDW: 14.2 % (ref 11.5–14.5)
WBC: 9.5 10*3/uL (ref 3.8–10.6)

## 2016-12-25 LAB — URINALYSIS, COMPLETE (UACMP) WITH MICROSCOPIC
BACTERIA UA: NONE SEEN
Bilirubin Urine: NEGATIVE
Glucose, UA: >=500 mg/dL — AB
Ketones, ur: NEGATIVE mg/dL
Leukocytes, UA: NEGATIVE
Nitrite: NEGATIVE
Protein, ur: 100 mg/dL — AB
SPECIFIC GRAVITY, URINE: 1.014 (ref 1.005–1.030)
SQUAMOUS EPITHELIAL / LPF: NONE SEEN
pH: 5 (ref 5.0–8.0)

## 2016-12-25 LAB — GLUCOSE, CAPILLARY
GLUCOSE-CAPILLARY: 445 mg/dL — AB (ref 65–99)
GLUCOSE-CAPILLARY: 275 mg/dL — AB (ref 65–99)
GLUCOSE-CAPILLARY: 247 mg/dL — AB (ref 65–99)
Glucose-Capillary: 382 mg/dL — ABNORMAL HIGH (ref 65–99)

## 2016-12-25 LAB — TROPONIN I: TROPONIN I: 0.06 ng/mL — AB (ref <0.03)

## 2016-12-25 LAB — LIPASE, BLOOD: Lipase: 26 U/L (ref 11–51)

## 2016-12-25 MED ORDER — SODIUM CHLORIDE 0.9 % IV SOLN
INTRAVENOUS | Status: DC
  Administered 2016-12-25 – 2016-12-26 (×2): via INTRAVENOUS

## 2016-12-25 MED ORDER — INSULIN REGULAR HUMAN 100 UNIT/ML IJ SOLN
INTRAMUSCULAR | Status: DC
  Administered 2016-12-25: 3.9 [IU]/h via INTRAVENOUS
  Filled 2016-12-25: qty 2.5

## 2016-12-25 MED ORDER — SODIUM CHLORIDE 0.9 % IV BOLUS (SEPSIS)
1000.0000 mL | Freq: Once | INTRAVENOUS | Status: AC
  Administered 2016-12-25: 1000 mL via INTRAVENOUS

## 2016-12-25 MED ORDER — POLYETHYLENE GLYCOL 3350 17 G PO PACK: 17.0000 g | PACK | Freq: Every day | ORAL | Status: DC | PRN

## 2016-12-25 MED ORDER — ACETAMINOPHEN 650 MG RE SUPP: 650.0000 mg | Freq: Four times a day (QID) | RECTAL | Status: DC | PRN

## 2016-12-25 MED ORDER — HYDRALAZINE HCL 20 MG/ML IJ SOLN: 10.0000 mg | Freq: Four times a day (QID) | INTRAMUSCULAR | Status: DC | PRN

## 2016-12-25 MED ORDER — ENOXAPARIN SODIUM 40 MG/0.4ML ~~LOC~~ SOLN: 40.0000 mg | SUBCUTANEOUS | Status: DC

## 2016-12-25 MED ORDER — SODIUM CHLORIDE 0.9% FLUSH
3.0000 mL | Freq: Two times a day (BID) | INTRAVENOUS | Status: DC
  Administered 2016-12-26: 3 mL via INTRAVENOUS

## 2016-12-25 MED ORDER — ATENOLOL 25 MG PO TABS
25.0000 mg | ORAL_TABLET | Freq: Every day | ORAL | Status: DC
Start: 2016-12-25 — End: 2016-12-26
  Administered 2016-12-25 – 2016-12-26 (×2): 25 mg via ORAL
  Filled 2016-12-25 (×2): qty 1

## 2016-12-25 MED ORDER — INSULIN ASPART 100 UNIT/ML ~~LOC~~ SOLN
0.0000 [IU] | Freq: Three times a day (TID) | SUBCUTANEOUS | Status: DC
  Administered 2016-12-25: 8 [IU] via SUBCUTANEOUS
  Administered 2016-12-26 (×2): 3 [IU] via SUBCUTANEOUS
  Filled 2016-12-25: qty 8
  Filled 2016-12-25: qty 3

## 2016-12-25 MED ORDER — INSULIN ASPART 100 UNIT/ML ~~LOC~~ SOLN
0.0000 [IU] | Freq: Every day | SUBCUTANEOUS | Status: DC
  Administered 2016-12-25: 2 [IU] via SUBCUTANEOUS
  Filled 2016-12-25: qty 2

## 2016-12-25 MED ORDER — ONDANSETRON HCL 4 MG PO TABS: 4.0000 mg | ORAL_TABLET | Freq: Four times a day (QID) | ORAL | Status: DC | PRN

## 2016-12-25 MED ORDER — INSULIN DETEMIR 100 UNIT/ML ~~LOC~~ SOLN
25.0000 [IU] | Freq: Two times a day (BID) | SUBCUTANEOUS | Status: DC
  Administered 2016-12-25 – 2016-12-26 (×2): 25 [IU] via SUBCUTANEOUS
  Filled 2016-12-25 (×4): qty 0.25

## 2016-12-25 MED ORDER — ONDANSETRON HCL 4 MG/2ML IJ SOLN: 4.0000 mg | Freq: Four times a day (QID) | INTRAMUSCULAR | Status: DC | PRN

## 2016-12-25 MED ORDER — ONDANSETRON HCL 4 MG/2ML IJ SOLN: 4.0000 mg | Freq: Once | INTRAMUSCULAR | Status: DC

## 2016-12-25 MED ORDER — ONDANSETRON HCL 4 MG/2ML IJ SOLN
4.0000 mg | Freq: Once | INTRAMUSCULAR | Status: AC
  Administered 2016-12-25: 4 mg via INTRAVENOUS
  Filled 2016-12-25: qty 2

## 2016-12-25 MED ORDER — ACETAMINOPHEN 325 MG PO TABS: 650.0000 mg | ORAL_TABLET | Freq: Four times a day (QID) | ORAL | Status: DC | PRN

## 2016-12-25 MED ORDER — LEVOTHYROXINE SODIUM 150 MCG PO TABS
300.0000 ug | ORAL_TABLET | Freq: Every day | ORAL | Status: DC
  Administered 2016-12-26: 300 ug via ORAL
  Filled 2016-12-25: qty 2

## 2016-12-25 MED ORDER — SIMVASTATIN 20 MG PO TABS
20.0000 mg | ORAL_TABLET | Freq: Every day | ORAL | Status: DC
  Administered 2016-12-25: 20 mg via ORAL
  Filled 2016-12-25: qty 1

## 2016-12-25 MED ORDER — HEPARIN SODIUM (PORCINE) 5000 UNIT/ML IJ SOLN
5000.0000 [IU] | Freq: Three times a day (TID) | INTRAMUSCULAR | Status: DC
  Administered 2016-12-25 – 2016-12-26 (×2): 5000 [IU] via SUBCUTANEOUS
  Filled 2016-12-25 (×2): qty 1

## 2016-12-25 MED ORDER — INSULIN ASPART 100 UNIT/ML ~~LOC~~ SOLN
5.0000 [IU] | Freq: Three times a day (TID) | SUBCUTANEOUS | Status: DC
  Administered 2016-12-25: 5 [IU] via SUBCUTANEOUS
  Filled 2016-12-25: qty 5

## 2016-12-25 NOTE — ED Notes (Signed)
Patient transported to X-ray 

## 2016-12-25 NOTE — Progress Notes (Signed)
Advance care planning  Discussed with patient regarding prognosis and CODE STATUS  He wants his wife to be his healthcare power of attorney.  He mentions he has seen many people go through CPR and on ventilator and he does not want that for himself as it would cause a functional decline in his ability to do things. Patient's wife and son at bedside during the discussion.  Orders entered  Time spent >20 minutes

## 2016-12-25 NOTE — H&P (Signed)
[facility] [place] NAME: [name]    MR#:  [id number]  DATE OF BIRTH:  Dec 13, 1940  DATE OF ADMISSION:  12/25/2016  PRIMARY CARE PHYSICIAN: Halina Maidens, MD   REQUESTING/REFERRING PHYSICIAN: Dr. Darl Householder  CHIEF COMPLAINT:   Chief Complaint  Patient presents with  . Diarrhea  . Emesis  . Nausea    HISTORY OF PRESENT ILLNESS:  [name]  is a 76 y.o. male with a known history of Insulin-dependent diabetes mellitus, hypertension, CAD presents to the emergency room sent in by his primary care physician due to hyperglycemia, dehydration and acute kidney injury. Patient had 2 weeks of nausea, vomiting and occasional diarrhea. He was seen at the urgent care and given minocycline and Phenergan with diagnosis of flu. He did not improve and went to see his primary care physician yesterday. Routine blood work were drawn and today family got a call to bring the patient in. Patient's nausea, vomiting and diarrhea have resolved. His blood sugars are greater than 500. Baseline creatinine is 2.5 and today it is 3.6. PAST MEDICAL HISTORY:   Past Medical History:  Diagnosis Date  . Cancer ([place])   . Coronary artery disease   . Diabetes mellitus without complication ([place])   . Hyperlipidemia   . Hypertension   . Thyroid disease     PAST SURGICAL HISTORY:   Past Surgical History:  Procedure Laterality Date  . APPENDECTOMY  2011  . CHOLECYSTECTOMY N/A 04/23/2016   Procedure: LAPAROSCOPIC CHOLECYSTECTOMY;  Surgeon: [name], MD;  Location: [facility] ORS;  Service: General;  Laterality: N/A;  . COLON SURGERY  2011   Colectomy for ileo-cecal valve cancer  . CORONARY ARTERY BYPASS GRAFT  1997   x 3  . HERNIA REPAIR  2011   Ventral hernia  . KNEE SURGERY Left   . PROSTATE SURGERY  2002   BPH benign pathology  . SPINE SURGERY  1989   Lumbar disc    SOCIAL HISTORY:   Social History  Substance Use Topics  . Smoking status: Former Smoker   Types: Cigarettes    Quit date: 06/24/1996  . Smokeless tobacco: Never Used  . Alcohol use No    FAMILY HISTORY:   Family History  Problem Relation Age of Onset  . Cancer Mother     Lung  . Diabetes Mother   . Heart disease Mother     DRUG ALLERGIES:   Allergies  Allergen Reactions  . Ace Inhibitors Other (See Comments)    Reaction:  Raises potassium   . Quinapril Rash    hyperkalemia    REVIEW OF SYSTEMS:   Review of Systems  Constitutional: Positive for malaise/fatigue. Negative for chills, fever and weight loss.  HENT: Negative for hearing loss and nosebleeds.   Eyes: Negative for blurred vision, double vision and pain.  Respiratory: Negative for cough, hemoptysis, sputum production, shortness of breath and wheezing.   Cardiovascular: Negative for chest pain, palpitations, orthopnea and leg swelling.  Gastrointestinal: Negative for abdominal pain, constipation, diarrhea, nausea and vomiting.  Genitourinary: Positive for frequency. Negative for dysuria and hematuria.  Musculoskeletal: Negative for back pain, falls and myalgias.  Skin: Negative for rash.  Neurological: Positive for weakness. Negative for dizziness, tremors, sensory change, speech change, focal weakness, seizures and headaches.  Endo/Heme/Allergies: Does not bruise/bleed easily.  Psychiatric/Behavioral: Negative for depression and memory loss. The patient is not nervous/anxious.     MEDICATIONS AT HOME:   Prior to Admission  medications   Medication Sig Start Date End Date Taking? Authorizing Provider  aspirin EC 325 MG tablet Take 325 mg by mouth daily.   Yes Historical Provider, MD  atenolol (TENORMIN) 25 MG tablet TAKE ONE TABLET BY MOUTH ONCE DAILY 06/16/16  Yes Glean Hess, MD  insulin NPH Human (HUMULIN N,NOVOLIN N) 100 UNIT/ML injection Inject 30 Units into the skin 2 (two) times daily.   Yes Historical Provider, MD  insulin regular (NOVOLIN R,HUMULIN R) 100 units/mL injection Inject 15-16  Units into the skin 2 (two) times daily. Pt takes depending on blood sugar.   Yes Historical Provider, MD  levothyroxine (SYNTHROID, LEVOTHROID) 300 MCG tablet Take 300 mcg by mouth daily before breakfast.   Yes Historical Provider, MD  minocycline (MINOCIN,DYNACIN) 100 MG capsule Take 100 mg by mouth 2 (two) times daily.   Yes Historical Provider, MD  promethazine (PHENERGAN) 25 MG tablet Take 25 mg by mouth every 6 (six) hours as needed for nausea or vomiting.   Yes Historical Provider, MD  simvastatin (ZOCOR) 20 MG tablet TAKE ONE TABLET BY MOUTH AT BEDTIME 08/02/16  Yes Glean Hess, MD  Blood Glucose Monitoring Suppl (RELION PRIME MONITOR) DEVI  01/29/14   Historical Provider, MD  glucose blood (RELION PRIME TEST) test strip  02/20/15   Historical Provider, MD  mupirocin cream (BACTROBAN) 2 % Apply 1 application topically 2 (two) times daily. Patient not taking: Reported on 12/25/2016 07/22/16   Glean Hess, MD     VITAL SIGNS:  Blood pressure (!) 166/92, pulse 72, temperature 97.5 F (36.4 C), temperature source Oral, resp. rate (!) 21, SpO2 97 %.  PHYSICAL EXAMINATION:  Physical Exam  GENERAL:  76 y.o.-year-old patient lying in the bed with no acute distress.  EYES: Pupils equal, round, reactive to light and accommodation. No scleral icterus. Extraocular muscles intact.  HEENT: Head atraumatic, normocephalic. Oropharynx and nasopharynx clear. No oropharyngeal erythema, moist oral mucosa  NECK:  Supple, no jugular venous distention. No thyroid enlargement, no tenderness.  LUNGS: Normal breath sounds bilaterally, no wheezing, rales, rhonchi. No use of accessory muscles of respiration.  CARDIOVASCULAR: S1, S2 normal. No murmurs, rubs, or gallops.  ABDOMEN: Soft, nontender, nondistended. Bowel sounds present. No organomegaly or mass.  EXTREMITIES: No pedal edema, cyanosis, or clubbing. + 2 pedal & radial pulses b/l.   NEUROLOGIC: Cranial nerves II through XII are intact. No focal  Motor or sensory deficits appreciated b/l PSYCHIATRIC: The patient is alert and oriented x 3. Good affect.  SKIN: No obvious rash, lesion, or ulcer.   LABORATORY PANEL:   CBC  Recent Labs Lab 12/25/16 1054  WBC 9.5  HGB 16.0  HCT 47.2  PLT 138*   ------------------------------------------------------------------------------------------------------------------  Chemistries   Recent Labs Lab 12/25/16 1054  NA 131*  K 5.2*  CL 103  CO2 21*  GLUCOSE 506*  BUN 63*  CREATININE 3.69*  CALCIUM 8.3*  AST 20  ALT 21  ALKPHOS 132*  BILITOT 0.5   ------------------------------------------------------------------------------------------------------------------  Cardiac Enzymes  Recent Labs Lab 12/25/16 1054  TROPONINI 0.06*   ------------------------------------------------------------------------------------------------------------------  RADIOLOGY:  Dg Chest 2 View  Result Date: 12/25/2016 CLINICAL DATA:  Two weeks of vomiting. Low-grade fever. Abnormal chest exam. History of coronary artery disease, stage IV chronic renal insufficiency, diabetes, former smoker. EXAM: CHEST  2 VIEW COMPARISON:  Portable chest x-ray of April 20, 2016 FINDINGS: There is chronic elevation of the right hemidiaphragm. The lungs are clear. There is no pleural effusion. The heart  is normal in size. There is calcification in the wall of the thoracic aorta. There are post CABG changes. There is no pulmonary vascular congestion. The observed bony thorax exhibits no acute abnormality. IMPRESSION: Previous CABG.  No evidence of CHF.  No pneumonia. Thoracic aortic atherosclerosis. Electronically Signed   By: David  Martinique M.D.   On: 12/25/2016 07:33   Dg Abd 2 Views  Result Date: 12/25/2016 CLINICAL DATA:  76 year old male with 2 weeks of abdominal pain. Initial encounter. Status post acute cholecystitis with cholecystectomy in July 2017. Personal history of colectomy for ileocecal valve cancer in 2011.  EXAM: ABDOMEN - 2 VIEW COMPARISON:  CT Abdomen and Pelvis 04/21/2016 and earlier. FINDINGS: Upright and supine views of the abdomen. Chronic elevation of the right hemidiaphragm appears stable. No pneumoperitoneum. Negative lung bases. Cholecystectomy clips now on the right upper quadrant. Non obstructed bowel gas pattern. Aortoiliac calcified atherosclerosis noted. Osteopenia. No acute osseous abnormality identified. Calcified femoral artery atherosclerosis. IMPRESSION: 1. Normal bowel gas pattern, no free air. 2.  Calcified aortic atherosclerosis. Electronically Signed   By: Genevie Ann M.D.   On: 12/25/2016 13:18     IMPRESSION AND PLAN:   * Hyperglycemia due to patient missing his insulin. We'll resume NPH 25 units twice a day insulin 30 units twice a day. Put him on sliding scale insulin and pre-meal NovoLog 5 units. IV fluids. Further management as per response. No DKA.  * Acute kidney injury over CKD stage III. Due to dehydration. Start IV fluids. Monitor input and output. Repeat labs in the morning.  * Recent gastroenteritis. Has resolved.  * Mild hyperkalemia. Patient tends to run high normal potassium. At this time he seems dehydrated. His potassium this should improve with insulin and IV fluids.  * Hypertension. Continue home medications.  * CAD stable  * DVT prophylaxis with Lovenox  All the records are reviewed and case discussed with ED provider. Management plans discussed with the patient, family and they are in agreement.  CODE STATUS: DNR  TOTAL TIME TAKING CARE OF THIS PATIENT: 40 minutes.   Hillary Bow R M.D on 12/25/2016 at 3:33 PM  Between 7am to 6pm - Pager - [phone]  After 6pm go to [email or web address] - password [email or web address] [facility]  Office  [phone]  CC: Primary care physician; Halina Maidens, MD  Note: This dictation was prepared with Dragon dictation along with smaller phrase technology. Any transcriptional errors that result from  this process are unintentional.

## 2016-12-25 NOTE — Progress Notes (Signed)
Pharmacist - Prescriber Communication  Insulin NPH has been changed to insulin detemir per protocol.  Malayja Freund A. [place], [place].D., BCPS Clinical Pharmacist 12/25/2016 1620

## 2016-12-25 NOTE — Progress Notes (Signed)
Pt arrived from the Er at approx 1720 via stretcher, pt stating he feels much better since receiving ivf in Er. Pt is alert and oriented x4, pt is on room air, denied pain, respirations are unlabored, telebox placed by York Cerise, Rn. Abdomen is soft, bs heard.ppp. piv #20 intact to rac with iv ns hung at 16mlshr per order, piv #20 intact to l arm as well, both sites are free of redness and swelling. Pt received insulin per order for coverage of fs and lantus. Family members at bedside, pt is able to state that plan of care is for discharge tomorrow.

## 2016-12-25 NOTE — ED Notes (Signed)
Glucose 506, reported from lab to [place] 2 1138, notified Dr Marcelene Butte no orders obtained

## 2016-12-25 NOTE — ED Triage Notes (Signed)
PCP sent pt over for further eval of possible dehydration. Pt with n/v/d for two weeks. NAD, skin warm and dry.

## 2016-12-25 NOTE — ED Provider Notes (Signed)
[facility] Provider Note   CSN: [id number] Arrival date & time: 12/25/16  1043     History   Chief Complaint Chief Complaint  Patient presents with  . Diarrhea  . Emesis  . Nausea    HPI [name] is a 76 y.o. male hx of CAD s/p CABG, DM, HTN, HL, here with Abdominal pain, nausea and vomiting and diarrhea. Patient states that the last 2 weeks or so, patient has been having some chills and myalgias. Patient went to urgent care 2 weeks ago and was thought to have influenza was given Phenergan, doxycycline. Patient has not felt better since then. He has persistent vomiting with stomach contents. Also some epigastric pain and occasional diarrhea. Patient went to see primary care doctor yesterday and had a chest x-ray that was unremarkable. He had labs drawn and sugar was 400. Has not been using his insulin that much because of his poor appetite. Has mild epigastric pain as well.   The history is provided by the patient.    Past Medical History:  Diagnosis Date  . Cancer ([place])   . Coronary artery disease   . Diabetes mellitus without complication ([place])   . Hyperlipidemia   . Hypertension   . Thyroid disease     Patient Active Problem List   Diagnosis Date Noted  . Chronic shoulder bursitis, left 07/22/2016  . Cholecystitis, acute 04/21/2016  . 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 CKD stage 4 due to type 2 diabetes mellitus ([place]) 03/16/2015  . Diabetes mellitus with polyneuropathy ([place]) 03/16/2015  . CAD in native artery 01/20/2015  . Gout 01/20/2015  . Diabetic peripheral neuropathy ([place]) 01/20/2015  . H/O malignant neoplasm of colon 01/20/2015  . Adult hypothyroidism 01/20/2015  . Hyperlipidemia associated with type 2 diabetes mellitus ([place]) 01/20/2015  . Encounter for long-term (current) use of insulin ([place])  07/27/2014  . Disorder affecting the body's metabolism 07/27/2014  . High potassium 05/19/2014  . 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 Benign essential HTN 04/14/2014  . Hypertension in stage 4 chronic kidney disease due to type 2 diabetes mellitus ([place]) 07/15/2013    Past Surgical History:  Procedure Laterality Date  . APPENDECTOMY  2011  . CHOLECYSTECTOMY N/A 04/23/2016   Procedure: LAPAROSCOPIC CHOLECYSTECTOMY;  Surgeon: [name], MD;  Location: [facility] ORS;  Service: General;  Laterality: N/A;  . COLON SURGERY  2011   Colectomy for ileo-cecal valve cancer  . CORONARY ARTERY BYPASS GRAFT  1997   x 3  . HERNIA REPAIR  2011   Ventral hernia  . KNEE SURGERY Left   . PROSTATE SURGERY  2002   BPH benign pathology  . SPINE SURGERY  1989   Lumbar disc       Home Medications    Prior to Admission medications   Medication Sig Start Date End Date Taking? Authorizing Provider  aspirin EC 325 MG tablet Take 325 mg by mouth daily.   Yes Historical Provider, MD  atenolol (TENORMIN) 25 MG tablet TAKE ONE TABLET BY MOUTH ONCE DAILY 06/16/16  Yes Glean Hess, MD  insulin NPH Human (HUMULIN N,NOVOLIN N) 100 UNIT/ML injection Inject 30 Units into the skin 2 (two) times daily.   Yes Historical Provider, MD  insulin regular (NOVOLIN R,HUMULIN R) 100 units/mL injection Inject 15-16 Units into the skin 2 (  two) times daily. Pt takes depending on blood sugar.   Yes Historical Provider, MD  levothyroxine (SYNTHROID, LEVOTHROID) 300 MCG tablet Take 300 mcg by mouth daily before breakfast.   Yes Historical Provider, MD  minocycline (MINOCIN,DYNACIN) 100 MG capsule Take 100 mg by mouth 2 (two) times daily.   Yes Historical Provider, MD  promethazine (PHENERGAN) 25 MG tablet Take 25 mg by mouth every 6 (six) hours as needed for nausea or vomiting.   Yes Historical Provider, MD  simvastatin (ZOCOR) 20 MG tablet TAKE ONE TABLET BY MOUTH AT BEDTIME 08/02/16   Yes Glean Hess, MD  Blood Glucose Monitoring Suppl (RELION PRIME MONITOR) DEVI  01/29/14   Historical Provider, MD  glucose blood (RELION PRIME TEST) test strip  02/20/15   Historical Provider, MD  mupirocin cream (BACTROBAN) 2 % Apply 1 application topically 2 (two) times daily. Patient not taking: Reported on 12/25/2016 07/22/16   Glean Hess, MD    Family History Family History  Problem Relation Age of Onset  . Cancer Mother     Lung  . Diabetes Mother   . Heart disease Mother     Social History Social History  Substance Use Topics  . Smoking status: Former Smoker    Types: Cigarettes    Quit date: 06/24/1996  . Smokeless tobacco: Never Used  . Alcohol use No     Allergies   Ace inhibitors and Quinapril   Review of Systems Review of Systems  Gastrointestinal: Positive for diarrhea and vomiting.  All other systems reviewed and are negative.    Physical Exam Updated Vital Signs BP (!) 180/97   Pulse 65   Temp 97.5 F (36.4 C) (Oral)   Resp 17   SpO2 100%   Physical Exam  Constitutional: He is oriented to person, place, and time.  Chronically ill, dehydrated   HENT:  Head: Normocephalic.  MM slightly dry   Eyes: EOM are normal. Pupils are equal, round, and reactive to light.  Neck: Normal range of motion. Neck supple.  Cardiovascular: Normal rate, regular rhythm and normal heart sounds.   Pulmonary/Chest: Effort normal and breath sounds normal. No respiratory distress. He has no wheezes. He has no rales.  Abdominal: Soft. Bowel sounds are normal.  Mild epigastric tenderness, no RUQ tenderness, no rebound   Musculoskeletal: Normal range of motion.  Neurological: He is alert and oriented to person, place, and time. No cranial nerve deficit. Coordination normal.  Skin: Skin is warm.  Psychiatric: He has a normal mood and affect.  Nursing note and vitals reviewed.    ED Treatments / Results  Labs (all labs ordered are listed, but only abnormal  results are displayed) Labs Reviewed  COMPREHENSIVE METABOLIC PANEL - Abnormal; Notable for the following:       Result Value   Sodium 131 (*)    Potassium 5.2 (*)    CO2 21 (*)    Glucose, Bld 506 (*)    BUN 63 (*)    Creatinine, Ser 3.69 (*)    Calcium 8.3 (*)    Total Protein 6.3 (*)    Albumin 2.7 (*)    Alkaline Phosphatase 132 (*)    GFR calc non Af Amer 15 (*)    GFR calc Af Amer 17 (*)    All other components within normal limits  CBC - Abnormal; Notable for the following:    Platelets 138 (*)    All other components within normal limits  TROPONIN I -  Abnormal; Notable for the following:    Troponin I 0.06 (*)    All other components within normal limits  GLUCOSE, CAPILLARY - Abnormal; Notable for the following:    Glucose-Capillary 445 (*)    All other components within normal limits  LIPASE, BLOOD  URINALYSIS, COMPLETE (UACMP) WITH MICROSCOPIC    EKG  EKG Interpretation None      ED ECG REPORT I, Wandra Arthurs, the attending physician, personally viewed and interpreted this ECG.   Date: 12/25/2016  EKG Time: 12:31 pm  Rate: 68  Rhythm: normal EKG, normal sinus rhythm  Axis: normal  Intervals:none  ST&T Change: nonspecific    Radiology Dg Chest 2 View  Result Date: 12/25/2016 CLINICAL DATA:  Two weeks of vomiting. Low-grade fever. Abnormal chest exam. History of coronary artery disease, stage IV chronic renal insufficiency, diabetes, former smoker. EXAM: CHEST  2 VIEW COMPARISON:  Portable chest x-ray of April 20, 2016 FINDINGS: There is chronic elevation of the right hemidiaphragm. The lungs are clear. There is no pleural effusion. The heart is normal in size. There is calcification in the wall of the thoracic aorta. There are post CABG changes. There is no pulmonary vascular congestion. The observed bony thorax exhibits no acute abnormality. IMPRESSION: Previous CABG.  No evidence of CHF.  No pneumonia. Thoracic aortic atherosclerosis. Electronically Signed    By: Mitchel Delduca  Martinique M.D.   On: 12/25/2016 07:33   Dg Abd 2 Views  Result Date: 12/25/2016 CLINICAL DATA:  76 year old male with 2 weeks of abdominal pain. Initial encounter. Status post acute cholecystitis with cholecystectomy in July 2017. Personal history of colectomy for ileocecal valve cancer in 2011. EXAM: ABDOMEN - 2 VIEW COMPARISON:  CT Abdomen and Pelvis 04/21/2016 and earlier. FINDINGS: Upright and supine views of the abdomen. Chronic elevation of the right hemidiaphragm appears stable. No pneumoperitoneum. Negative lung bases. Cholecystectomy clips now on the right upper quadrant. Non obstructed bowel gas pattern. Aortoiliac calcified atherosclerosis noted. Osteopenia. No acute osseous abnormality identified. Calcified femoral artery atherosclerosis. IMPRESSION: 1. Normal bowel gas pattern, no free air. 2.  Calcified aortic atherosclerosis. Electronically Signed   By: Genevie Ann M.D.   On: 12/25/2016 13:18    Procedures Procedures (including critical care time)   CRITICAL CARE Performed by: Wandra Arthurs   Total critical care time: 30 minutes  Critical care time was exclusive of separately billable procedures and treating other patients.  Critical care was necessary to treat or prevent imminent or life-threatening deterioration.  Critical care was time spent personally by me on the following activities: development of treatment plan with patient and/or surrogate as well as nursing, discussions with consultants, evaluation of patient's response to treatment, examination of patient, obtaining history from patient or surrogate, ordering and performing treatments and interventions, ordering and review of laboratory studies, ordering and review of radiographic studies, pulse oximetry and re-evaluation of patient's condition.   Medications Ordered in ED Medications  insulin regular (NOVOLIN R,HUMULIN R) 250 Units in sodium chloride 0.9 % 250 mL (1 Units/mL) infusion (3.9 Units/hr Intravenous  New Bag/Given 12/25/16 1311)  sodium chloride 0.9 % bolus 1,000 mL (1,000 mLs Intravenous New Bag/Given 12/25/16 1150)  ondansetron (ZOFRAN) injection 4 mg (4 mg Intravenous Given 12/25/16 1221)  sodium chloride 0.9 % bolus 1,000 mL (1,000 mLs Intravenous New Bag/Given 12/25/16 1223)     Initial Impression / Assessment and Plan / ED Course  I have reviewed the triage vital signs and the nursing notes.  Pertinent labs &  imaging results that were available during my care of the patient were reviewed by me and considered in my medical decision making (see chart for details).     [name] is a 76 y.o. male here with abdominal pain, nausea, vomiting, chest pain. CBG was 500 initially. Concerned for possible hyperosmolar vs DKA. Will get labs, lipase, UA. Will give IVF.   2:05 PM Labs showed Na 131, CO2 21. Glucose 500. Trop 0.06, likely demand ischemia. Ab xray unremarkable. Lipase nl. Given 2 L NS bolus. Started on [employer]. Will admit for hyperglycemia likely from dehydration from vomiting.    Final Clinical Impressions(s) / ED Diagnoses   Final diagnoses:  Abdominal pain    New Prescriptions New Prescriptions   No medications on file     Drenda Freeze, MD 12/25/16 1406

## 2016-12-25 NOTE — ED Notes (Addendum)
Have not received Levemir injection from pharmacy; pharmacy notified at this time to send dose to [facility] as pt is being transported there now.

## 2016-12-25 NOTE — ED Notes (Signed)
Pharmacy notified to send insulin drip. 

## 2016-12-26 LAB — CBC
HCT: 43.1 % (ref 40.0–52.0)
HEMOGLOBIN: 14.4 g/dL (ref 13.0–18.0)
MCH: 31.7 pg (ref 26.0–34.0)
MCHC: 33.4 g/dL (ref 32.0–36.0)
MCV: 95.1 fL (ref 80.0–100.0)
Platelets: 110 10*3/uL — ABNORMAL LOW (ref 150–440)
RBC: 4.53 MIL/uL (ref 4.40–5.90)
RDW: 14 % (ref 11.5–14.5)
WBC: 8.1 10*3/uL (ref 3.8–10.6)

## 2016-12-26 LAB — BASIC METABOLIC PANEL
Anion gap: 5 (ref 5–15)
BUN: 52 mg/dL — ABNORMAL HIGH (ref 6–20)
CALCIUM: 7.8 mg/dL — AB (ref 8.9–10.3)
CO2: 19 mmol/L — AB (ref 22–32)
CREATININE: 3.31 mg/dL — AB (ref 0.61–1.24)
Chloride: 110 mmol/L (ref 101–111)
GFR calc Af Amer: 19 mL/min — ABNORMAL LOW (ref >60)
GFR calc non Af Amer: 17 mL/min — ABNORMAL LOW (ref >60)
GLUCOSE: 235 mg/dL — AB (ref 65–99)
Potassium: 4.4 mmol/L (ref 3.5–5.1)
Sodium: 134 mmol/L — ABNORMAL LOW (ref 135–145)

## 2016-12-26 LAB — HEMOGLOBIN A1C
Hgb A1c MFr Bld: 9.6 % — ABNORMAL HIGH (ref 4.8–5.6)
Mean Plasma Glucose: 229 mg/dL

## 2016-12-26 LAB — GLUCOSE, CAPILLARY
GLUCOSE-CAPILLARY: 192 mg/dL — AB (ref 65–99)
Glucose-Capillary: 294 mg/dL — ABNORMAL HIGH (ref 65–99)
Glucose-Capillary: 173 mg/dL — ABNORMAL HIGH (ref 65–99)

## 2016-12-26 MED ORDER — INSULIN ASPART 100 UNIT/ML ~~LOC~~ SOLN
12.0000 [IU] | Freq: Three times a day (TID) | SUBCUTANEOUS | Status: DC
  Administered 2016-12-26 (×2): 12 [IU] via SUBCUTANEOUS
  Filled 2016-12-26: qty 12

## 2016-12-26 NOTE — Progress Notes (Signed)
Inpatient Diabetes Program Recommendations  AACE/ADA: New Consensus Statement on Inpatient Glycemic Control (2015)  Target Ranges:  Prepandial:   less than 140 mg/dL      Peak postprandial:   less than 180 mg/dL (1-2 hours)      Critically ill patients:  140 - 180 mg/dL   Lab Results  Component Value Date   GLUCAP 192 (H) 12/26/2016   HGBA1C 9.6 (H) 12/25/2016    Review of Glycemic Control  Results for LAKYN, MANTIONE (MRN [id number]) as of 12/26/2016 08:13  Ref. Range 04/26/2016 16:26 12/25/2016 13:09 12/25/2016 14:12 12/25/2016 17:28 12/25/2016 21:00  Glucose-Capillary Latest Ref Range: 65 - 99 mg/dL 319 (H) 445 (H) 382 (H) 275 (H) 247 (H)    Diabetes history: Type 2 Outpatient Diabetes medications: NPH 30 units bid, R insulin 15-16 units bid with meals Current orders for Inpatient glycemic control: Novolog 12 units tid with meals, Levemir 25 units bid, novolog 0-15 units tid, Novolog 0-5 units qhs  Inpatient Diabetes Program Recommendations:  CBG remain elevated- consider increasing Levemir to 30 units bid.   Gentry Fitz, RN, BA, MHA, CDE Diabetes Coordinator Inpatient Diabetes Program  [phone] (Team Pager) [phone] ([place]) 12/26/2016 8:22 AM

## 2016-12-26 NOTE — Discharge Instructions (Signed)
Diabetic diet.  Activity as before  Drink plenty of fluids

## 2016-12-26 NOTE — Discharge Summary (Signed)
[facility] NAME: [name]    MR#:  [id number]  DATE OF BIRTH:  1941-01-29  DATE OF ADMISSION:  12/25/2016 ADMITTING PHYSICIAN: Hillary Bow, MD  DATE OF DISCHARGE: 12/26/2016  1:20 PM  PRIMARY CARE PHYSICIAN: Halina Maidens, MD   ADMISSION DIAGNOSIS:  Hyperglycemia [R73.9] Abdominal pain [R10.9]  DISCHARGE DIAGNOSIS:  Active Problems:   Hyperglycemia   SECONDARY DIAGNOSIS:   Past Medical History:  Diagnosis Date  . Cancer ([place])   . Coronary artery disease   . Diabetes mellitus without complication ([place])   . Hyperlipidemia   . Hypertension   . Thyroid disease      ADMITTING HISTORY  HISTORY OF PRESENT ILLNESS:  [name]  is a 76 y.o. male with a known history of Insulin-dependent diabetes mellitus, hypertension, CAD presents to the emergency room sent in by his primary care physician due to hyperglycemia, dehydration and acute kidney injury. Patient had 2 weeks of nausea, vomiting and occasional diarrhea. He was seen at the urgent care and given minocycline and Phenergan with diagnosis of flu. He did not improve and went to see his primary care physician yesterday. Routine blood work were drawn and today family got a call to bring the patient in. Patient's nausea, vomiting and diarrhea have resolved. His blood sugars are greater than 500. Baseline creatinine is 2.5 and today it is 3.6.   HOSPITAL COURSE:   * Hyperglycemia due to patient missing his insulin.  Restarted patient home dose of insulin and SSI. BS improved in 200s. No vomiting. Feels better No DKA.  * Acute kidney injury over CKD stage III. Due to dehydration. Started IV fluids IMproving  * Recent gastroenteritis. Has resolved.  * Mild hyperkalemia due to AKI  Resolved with insulin and IVF  * Hypertension. Continue home medications.  * CAD stable  Stable for discharge home  CONSULTS OBTAINED:    DRUG ALLERGIES:   Allergies   Allergen Reactions  . Ace Inhibitors Other (See Comments)    Reaction:  Raises potassium   . Quinapril Rash    hyperkalemia    DISCHARGE MEDICATIONS:   Discharge Medication List as of 12/26/2016 11:01 AM    CONTINUE these medications which have NOT CHANGED   Details  aspirin EC 325 MG tablet Take 325 mg by mouth daily., Until Discontinued, Historical Med    atenolol (TENORMIN) 25 MG tablet TAKE ONE TABLET BY MOUTH ONCE DAILY, Normal    insulin NPH Human (HUMULIN N,NOVOLIN N) 100 UNIT/ML injection Inject 30 Units into the skin 2 (two) times daily., Until Discontinued, Historical Med    insulin regular (NOVOLIN R,HUMULIN R) 100 units/mL injection Inject 15-16 Units into the skin 2 (two) times daily. Pt takes depending on blood sugar., Until Discontinued, Historical Med    levothyroxine (SYNTHROID, LEVOTHROID) 300 MCG tablet Take 300 mcg by mouth daily before breakfast., Until Discontinued, Historical Med    minocycline (MINOCIN,DYNACIN) 100 MG capsule Take 100 mg by mouth 2 (two) times daily., Historical Med    promethazine (PHENERGAN) 25 MG tablet Take 25 mg by mouth every 6 (six) hours as needed for nausea or vomiting., Historical Med    simvastatin (ZOCOR) 20 MG tablet TAKE ONE TABLET BY MOUTH AT BEDTIME, Normal    Blood Glucose Monitoring Suppl (RELION PRIME MONITOR) [place] Starting 01/29/2014, Until Discontinued, Historical Med    glucose blood (RELION PRIME TEST) test strip Historical Med    mupirocin cream (BACTROBAN) 2 % Apply  1 application topically 2 (two) times daily., Starting Mon 07/22/2016, Normal        Today   VITAL SIGNS:  Blood pressure (!) 180/82, pulse 65, temperature 97.5 F (36.4 C), temperature source Oral, resp. rate 16, SpO2 98 %.  I/O:   Intake/Output Summary (Last 24 hours) at 12/26/16 1423 Last data filed at 12/26/16 0534  Gross per 24 hour  Intake           251.88 ml  Output             1200 ml  Net          -948.12 ml    PHYSICAL  EXAMINATION:  Physical Exam  GENERAL:  76 y.o.-year-old patient lying in the bed with no acute distress.  LUNGS: Normal breath sounds bilaterally, no wheezing, rales,rhonchi or crepitation. No use of accessory muscles of respiration.  CARDIOVASCULAR: S1, S2 normal. No murmurs, rubs, or gallops.  ABDOMEN: Soft, non-tender, non-distended. Bowel sounds present. No organomegaly or mass.  NEUROLOGIC: Moves all 4 extremities. PSYCHIATRIC: The patient is alert and oriented x 3.  SKIN: No obvious rash, lesion, or ulcer.   DATA REVIEW:   CBC  Recent Labs Lab 12/26/16 0412  WBC 8.1  HGB 14.4  HCT 43.1  PLT 110*    Chemistries   Recent Labs Lab 12/25/16 1054 12/26/16 0412  NA 131* 134*  K 5.2* 4.4  CL 103 110  CO2 21* 19*  GLUCOSE 506* 235*  BUN 63* 52*  CREATININE 3.69* 3.31*  CALCIUM 8.3* 7.8*  AST 20  --   ALT 21  --   ALKPHOS 132*  --   BILITOT 0.5  --     Cardiac Enzymes  Recent Labs Lab 12/25/16 1054  TROPONINI 0.06*    Microbiology Results  Results for orders placed or performed during the hospital encounter of 04/20/16  Surgical PCR screen     Status: None   Collection Time: 04/23/16 12:51 AM  Result Value Ref Range Status   MRSA, PCR NEGATIVE NEGATIVE Final   Staphylococcus aureus NEGATIVE NEGATIVE Final    Comment:        The Xpert SA Assay (FDA approved for NASAL specimens in patients over 92 years of age), is one component of a comprehensive surveillance program.  Test performance has been validated by [facility] for patients greater than or equal to 17 year old. It is not intended to diagnose infection nor to guide or monitor treatment.     RADIOLOGY:  Dg Chest 2 View  Result Date: 12/25/2016 CLINICAL DATA:  Two weeks of vomiting. Low-grade fever. Abnormal chest exam. History of coronary artery disease, stage IV chronic renal insufficiency, diabetes, former smoker. EXAM: CHEST  2 VIEW COMPARISON:  Portable chest x-ray of April 20, 2016  FINDINGS: There is chronic elevation of the right hemidiaphragm. The lungs are clear. There is no pleural effusion. The heart is normal in size. There is calcification in the wall of the thoracic aorta. There are post CABG changes. There is no pulmonary vascular congestion. The observed bony thorax exhibits no acute abnormality. IMPRESSION: Previous CABG.  No evidence of CHF.  No pneumonia. Thoracic aortic atherosclerosis. Electronically Signed   By: David  Martinique M.D.   On: 12/25/2016 07:33   Dg Abd 2 Views  Result Date: 12/25/2016 CLINICAL DATA:  76 year old male with 2 weeks of abdominal pain. Initial encounter. Status post acute cholecystitis with cholecystectomy in July 2017. Personal history of colectomy for ileocecal valve  cancer in 2011. EXAM: ABDOMEN - 2 VIEW COMPARISON:  CT Abdomen and Pelvis 04/21/2016 and earlier. FINDINGS: Upright and supine views of the abdomen. Chronic elevation of the right hemidiaphragm appears stable. No pneumoperitoneum. Negative lung bases. Cholecystectomy clips now on the right upper quadrant. Non obstructed bowel gas pattern. Aortoiliac calcified atherosclerosis noted. Osteopenia. No acute osseous abnormality identified. Calcified femoral artery atherosclerosis. IMPRESSION: 1. Normal bowel gas pattern, no free air. 2.  Calcified aortic atherosclerosis. Electronically Signed   By: Genevie Ann M.D.   On: 12/25/2016 13:18    Follow up with PCP in 1 week.  Management plans discussed with the patient, family and they are in agreement.  CODE STATUS:     Code Status Orders        Start     Ordered   12/25/16 1531  Do not attempt resuscitation (DNR)  Continuous    Question Answer Comment  In the event of cardiac or respiratory ARREST Do not call a "code blue"   In the event of cardiac or respiratory ARREST Do not perform Intubation, CPR, defibrillation or ACLS   In the event of cardiac or respiratory ARREST Use medication by any route, position, wound care, and other  measures to relive pain and suffering. May use oxygen, suction and manual treatment of airway obstruction as needed for comfort.      12/25/16 1531    Code Status History    Date Active Date Inactive Code Status Order ID Comments User Context   04/23/2016  1:20 PM 04/26/2016 10:17 PM Full Code [id number]  [name], MD Inpatient   04/21/2016  2:13 AM 04/23/2016  1:20 PM Full Code [id number]  Mali Gonczy, MD ED      TOTAL TIME TAKING CARE OF THIS PATIENT ON DAY OF DISCHARGE: more than 30 minutes.   Hillary Bow R M.D on 12/26/2016 at 2:23 PM  Between 7am to 6pm - Pager - [phone]  After 6pm go to [email or web address] - password [email or web address] [facility]  Office  [phone]  CC: Primary care physician; Halina Maidens, MD  Note: This dictation was prepared with Dragon dictation along with smaller phrase technology. Any transcriptional errors that result from this process are unintentional.

## 2016-12-26 NOTE — Progress Notes (Signed)
Pt discharged to home via wheelchair accompanied by grandson without incident per MD order. No change in pt from AM assessment upon discharge. Pt denies pain on discharge. Prior to d/c, all teachings done both written and verbal and all questions answered.

## 2017-01-02 ENCOUNTER — Ambulatory Visit (INDEPENDENT_AMBULATORY_CARE_PROVIDER_SITE_OTHER): Payer: PPO | Admitting: Internal Medicine

## 2017-01-02 ENCOUNTER — Encounter: Payer: Self-pay | Admitting: Internal Medicine

## 2017-01-02 VITALS — BP 140/82 | HR 72 | Ht 74.0 in | Wt 236.0 lb

## 2017-01-02 DIAGNOSIS — E1122 Type 2 diabetes mellitus with diabetic chronic kidney disease: Secondary | ICD-10-CM

## 2017-01-02 DIAGNOSIS — R197 Diarrhea, unspecified: Secondary | ICD-10-CM

## 2017-01-02 DIAGNOSIS — I10 Essential (primary) hypertension: Secondary | ICD-10-CM

## 2017-01-02 DIAGNOSIS — N184 Chronic kidney disease, stage 4 (severe): Secondary | ICD-10-CM

## 2017-01-02 DIAGNOSIS — I129 Hypertensive chronic kidney disease with stage 1 through stage 4 chronic kidney disease, or unspecified chronic kidney disease: Secondary | ICD-10-CM

## 2017-01-02 NOTE — Progress Notes (Signed)
Date:  01/02/2017   Name:  [name]   DOB:  27-Oct-1940   MRN:  [id number]   Chief Complaint: Hospitalization Follow-up Patient admitted to [facility] with diarrhea, vomiting and dehydration from 3/14 to 12/26/16.  He was treated with IVF and insulin for DKA and acute kidney injury.  At discharge, his A1C was 9.0.  His GFR only slightly improved at 19. He is feeling better.  No further n/v/d.  Eating and drinking normally.  More energy but still not at baseline. He sees Nephrologist on Monday and Endocrinology next month. He is taking insulin NPH and Regular as instructed.   Review of Systems  Constitutional: Positive for fatigue. Negative for chills, fever and unexpected weight change.  Respiratory: Negative for cough, chest tightness and shortness of breath.   Cardiovascular: Negative for chest pain, palpitations and leg swelling.  Gastrointestinal: Negative for abdominal pain, diarrhea, nausea and vomiting.  Musculoskeletal: Positive for myalgias.  Neurological: Negative for syncope and light-headedness.  Psychiatric/Behavioral: Negative for sleep disturbance. The patient is not nervous/anxious.     Patient Active Problem List   Diagnosis Date Noted  . Hyperglycemia 12/25/2016  . Chronic shoulder bursitis, left 07/22/2016  . Cholecystitis, acute 04/21/2016  . Uncontrolled type 2 diabetes mellitus with stage 4 chronic kidney disease, with long-term current use of insulin ([place]) 09/14/2015  . 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 CKD stage 4 due to type 2 diabetes mellitus ([place]) 03/16/2015  . Diabetes mellitus with polyneuropathy ([place]) 03/16/2015  . CAD in native artery 01/20/2015  . Gout 01/20/2015  . Diabetic peripheral neuropathy ([place]) 01/20/2015  . H/O malignant neoplasm of colon 01/20/2015  . Adult hypothyroidism 01/20/2015  . Hyperlipidemia associated with type 2 diabetes mellitus ([place]) 01/20/2015  . Encounter for long-term (current) use of insulin ([place]) 07/27/2014  .  Disorder affecting the body's metabolism 07/27/2014  . High potassium 05/19/2014  . 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 Benign essential HTN 04/14/2014  . Hypertension in stage 4 chronic kidney disease due to type 2 diabetes mellitus ([place]) 07/15/2013    Prior to Admission medications   Medication Sig Start Date End Date Taking? Authorizing Provider  aspirin EC 325 MG tablet Take 325 mg by mouth daily.   Yes Historical Provider, MD  atenolol (TENORMIN) 25 MG tablet TAKE ONE TABLET BY MOUTH ONCE DAILY 06/16/16  Yes Glean Hess, MD  glucose blood (RELION PRIME TEST) test strip  02/20/15  Yes Historical Provider, MD  insulin NPH Human (HUMULIN N,NOVOLIN N) 100 UNIT/ML injection Inject 30 Units into the skin 2 (two) times daily.   Yes Historical Provider, MD  insulin regular (NOVOLIN R,HUMULIN R) 100 units/mL injection Inject 15-16 Units into the skin 2 (two) times daily. Pt takes depending on blood sugar.   Yes Historical Provider, MD  levothyroxine (SYNTHROID, LEVOTHROID) 300 MCG tablet Take 300 mcg by mouth daily before breakfast.   Yes Historical Provider, MD  mupirocin cream (BACTROBAN) 2 % Apply 1 application topically 2 (two) times daily. 07/22/16  Yes Glean Hess, MD  simvastatin (ZOCOR) 20 MG tablet TAKE ONE TABLET BY MOUTH AT BEDTIME 08/02/16  Yes Glean Hess, MD  Blood Glucose Monitoring Suppl (RELION PRIME MONITOR) [place]  01/29/14   Historical Provider, MD    Allergies  Allergen Reactions  . Ace Inhibitors Other (See Comments)    Reaction:  Raises potassium   . Quinapril Rash    hyperkalemia  Past Surgical History:  Procedure Laterality Date  . APPENDECTOMY  2011  . CHOLECYSTECTOMY N/A 04/23/2016   Procedure: LAPAROSCOPIC CHOLECYSTECTOMY;  Surgeon: [name], MD;  Location: [facility] ORS;  Service: General;  Laterality: N/A;  . COLON SURGERY  2011   Colectomy for ileo-cecal valve cancer  . CORONARY ARTERY BYPASS GRAFT  1997     x 3  . HERNIA REPAIR  2011   Ventral hernia  . KNEE SURGERY Left   . PROSTATE SURGERY  2002   BPH benign pathology  . SPINE SURGERY  1989   Lumbar disc    Social History  Substance Use Topics  . Smoking status: Former Smoker    Types: Cigarettes    Quit date: 06/24/1996  . Smokeless tobacco: Never Used  . Alcohol use No     Medication list has been reviewed and updated.   Physical Exam  Constitutional: He is oriented to person, place, and time. He appears well-developed. No distress.  HENT:  Head: Normocephalic and atraumatic.  Cardiovascular: Normal rate, regular rhythm and normal heart sounds.   Pulmonary/Chest: Effort normal and breath sounds normal. No respiratory distress. He has no wheezes.  Musculoskeletal: Normal range of motion.  Neurological: He is alert and oriented to person, place, and time.  Skin: Skin is warm and dry. No rash noted. He is not diaphoretic. No pallor.  Psychiatric: He has a normal mood and affect. His behavior is normal. Thought content normal.  Nursing note and vitals reviewed.   BP 140/82 (BP Location: Left Arm, Patient Position: Sitting, Cuff Size: Large)   Pulse 72   Ht 6\' 2"  (1.88 m)   Wt 236 lb (107 kg)   BMI 30.30 kg/m   Assessment and Plan: 1. Diarrhea with dehydration Sx resolved Will have renal function checked early next week Continue fluids and healthy diet  2. Hypertension in stage 4 chronic kidney disease due to type 2 diabetes mellitus (HCC) Continue daily insulin  3. Benign essential HTN Fair control - will adjust if needed   No orders of the defined types were placed in this encounter.   Halina Maidens, MD [facility]  01/02/2017

## 2017-01-06 DIAGNOSIS — E039 Hypothyroidism, unspecified: Secondary | ICD-10-CM | POA: Diagnosis not present

## 2017-01-06 DIAGNOSIS — E785 Hyperlipidemia, unspecified: Secondary | ICD-10-CM | POA: Diagnosis not present

## 2017-01-06 DIAGNOSIS — I2510 Atherosclerotic heart disease of native coronary artery without angina pectoris: Secondary | ICD-10-CM | POA: Diagnosis not present

## 2017-01-06 DIAGNOSIS — Z7982 Long term (current) use of aspirin: Secondary | ICD-10-CM | POA: Diagnosis not present

## 2017-01-06 DIAGNOSIS — N400 Enlarged prostate without lower urinary tract symptoms: Secondary | ICD-10-CM | POA: Diagnosis not present

## 2017-01-06 DIAGNOSIS — M1A9XX0 Chronic gout, unspecified, without tophus (tophi): Secondary | ICD-10-CM | POA: Diagnosis not present

## 2017-01-06 DIAGNOSIS — E1140 Type 2 diabetes mellitus with diabetic neuropathy, unspecified: Secondary | ICD-10-CM | POA: Diagnosis not present

## 2017-01-06 DIAGNOSIS — Z794 Long term (current) use of insulin: Secondary | ICD-10-CM | POA: Diagnosis not present

## 2017-01-06 DIAGNOSIS — N183 Chronic kidney disease, stage 3 (moderate): Secondary | ICD-10-CM | POA: Diagnosis not present

## 2017-01-06 DIAGNOSIS — N179 Acute kidney failure, unspecified: Secondary | ICD-10-CM | POA: Diagnosis not present

## 2017-01-06 DIAGNOSIS — E1121 Type 2 diabetes mellitus with diabetic nephropathy: Secondary | ICD-10-CM | POA: Diagnosis not present

## 2017-01-06 DIAGNOSIS — Z85038 Personal history of other malignant neoplasm of large intestine: Secondary | ICD-10-CM | POA: Diagnosis not present

## 2017-01-06 DIAGNOSIS — Z79899 Other long term (current) drug therapy: Secondary | ICD-10-CM | POA: Diagnosis not present

## 2017-01-06 DIAGNOSIS — E875 Hyperkalemia: Secondary | ICD-10-CM | POA: Diagnosis not present

## 2017-01-06 DIAGNOSIS — E1122 Type 2 diabetes mellitus with diabetic chronic kidney disease: Secondary | ICD-10-CM | POA: Diagnosis not present

## 2017-01-06 DIAGNOSIS — I10 Essential (primary) hypertension: Secondary | ICD-10-CM | POA: Diagnosis not present

## 2017-01-06 DIAGNOSIS — M109 Gout, unspecified: Secondary | ICD-10-CM | POA: Diagnosis not present

## 2017-01-17 ENCOUNTER — Other Ambulatory Visit: Payer: Self-pay | Admitting: Internal Medicine

## 2017-01-17 ENCOUNTER — Encounter: Payer: Self-pay | Admitting: Internal Medicine

## 2017-01-20 ENCOUNTER — Ambulatory Visit: Payer: PPO | Admitting: Internal Medicine

## 2017-03-02 ENCOUNTER — Other Ambulatory Visit: Payer: Self-pay | Admitting: Internal Medicine

## 2017-04-23 DIAGNOSIS — N184 Chronic kidney disease, stage 4 (severe): Secondary | ICD-10-CM | POA: Diagnosis not present

## 2017-04-28 DIAGNOSIS — E872 Acidosis: Secondary | ICD-10-CM | POA: Diagnosis not present

## 2017-04-28 DIAGNOSIS — N183 Chronic kidney disease, stage 3 (moderate): Secondary | ICD-10-CM | POA: Diagnosis not present

## 2017-04-28 DIAGNOSIS — E875 Hyperkalemia: Secondary | ICD-10-CM | POA: Diagnosis not present

## 2017-04-28 DIAGNOSIS — Z794 Long term (current) use of insulin: Secondary | ICD-10-CM | POA: Diagnosis not present

## 2017-04-28 DIAGNOSIS — E1165 Type 2 diabetes mellitus with hyperglycemia: Secondary | ICD-10-CM | POA: Diagnosis not present

## 2017-04-28 DIAGNOSIS — M1A9XX0 Chronic gout, unspecified, without tophus (tophi): Secondary | ICD-10-CM | POA: Diagnosis not present

## 2017-04-28 DIAGNOSIS — E1122 Type 2 diabetes mellitus with diabetic chronic kidney disease: Secondary | ICD-10-CM | POA: Diagnosis not present

## 2017-04-28 DIAGNOSIS — N184 Chronic kidney disease, stage 4 (severe): Secondary | ICD-10-CM | POA: Diagnosis not present

## 2017-05-05 DIAGNOSIS — E063 Autoimmune thyroiditis: Secondary | ICD-10-CM | POA: Diagnosis not present

## 2017-05-05 DIAGNOSIS — E038 Other specified hypothyroidism: Secondary | ICD-10-CM | POA: Diagnosis not present

## 2017-05-05 DIAGNOSIS — E1165 Type 2 diabetes mellitus with hyperglycemia: Secondary | ICD-10-CM | POA: Diagnosis not present

## 2017-05-05 DIAGNOSIS — Z794 Long term (current) use of insulin: Secondary | ICD-10-CM | POA: Diagnosis not present

## 2017-05-14 DIAGNOSIS — I639 Cerebral infarction, unspecified: Secondary | ICD-10-CM

## 2017-05-14 HISTORY — DX: Cerebral infarction, unspecified: I63.9

## 2017-05-29 DIAGNOSIS — E1142 Type 2 diabetes mellitus with diabetic polyneuropathy: Secondary | ICD-10-CM | POA: Diagnosis not present

## 2017-05-29 DIAGNOSIS — E038 Other specified hypothyroidism: Secondary | ICD-10-CM | POA: Diagnosis not present

## 2017-05-29 DIAGNOSIS — N184 Chronic kidney disease, stage 4 (severe): Secondary | ICD-10-CM | POA: Diagnosis not present

## 2017-05-29 DIAGNOSIS — E1165 Type 2 diabetes mellitus with hyperglycemia: Secondary | ICD-10-CM | POA: Diagnosis not present

## 2017-05-29 DIAGNOSIS — E669 Obesity, unspecified: Secondary | ICD-10-CM | POA: Diagnosis not present

## 2017-05-29 DIAGNOSIS — E063 Autoimmune thyroiditis: Secondary | ICD-10-CM | POA: Diagnosis not present

## 2017-05-29 DIAGNOSIS — Z794 Long term (current) use of insulin: Secondary | ICD-10-CM | POA: Diagnosis not present

## 2017-05-29 DIAGNOSIS — E1122 Type 2 diabetes mellitus with diabetic chronic kidney disease: Secondary | ICD-10-CM | POA: Diagnosis not present

## 2017-06-11 ENCOUNTER — Encounter: Payer: Self-pay | Admitting: Emergency Medicine

## 2017-06-11 ENCOUNTER — Emergency Department: Payer: PPO

## 2017-06-11 ENCOUNTER — Observation Stay
Admission: EM | Admit: 2017-06-11 | Discharge: 2017-06-14 | Disposition: A | Discharge status: Home / Self Care | Payer: PPO | Attending: Internal Medicine | Admitting: Internal Medicine

## 2017-06-11 DIAGNOSIS — R748 Abnormal levels of other serum enzymes: Secondary | ICD-10-CM | POA: Diagnosis not present

## 2017-06-11 DIAGNOSIS — R778 Other specified abnormalities of plasma proteins: Secondary | ICD-10-CM

## 2017-06-11 DIAGNOSIS — I2510 Atherosclerotic heart disease of native coronary artery without angina pectoris: Secondary | ICD-10-CM | POA: Insufficient documentation

## 2017-06-11 DIAGNOSIS — R7989 Other specified abnormal findings of blood chemistry: Secondary | ICD-10-CM

## 2017-06-11 DIAGNOSIS — E039 Hypothyroidism, unspecified: Secondary | ICD-10-CM | POA: Diagnosis not present

## 2017-06-11 DIAGNOSIS — I129 Hypertensive chronic kidney disease with stage 1 through stage 4 chronic kidney disease, or unspecified chronic kidney disease: Secondary | ICD-10-CM | POA: Diagnosis not present

## 2017-06-11 DIAGNOSIS — Z888 Allergy status to other drugs, medicaments and biological substances status: Secondary | ICD-10-CM | POA: Insufficient documentation

## 2017-06-11 DIAGNOSIS — Z8249 Family history of ischemic heart disease and other diseases of the circulatory system: Secondary | ICD-10-CM | POA: Insufficient documentation

## 2017-06-11 DIAGNOSIS — I10 Essential (primary) hypertension: Secondary | ICD-10-CM | POA: Diagnosis not present

## 2017-06-11 DIAGNOSIS — I63323 Cerebral infarction due to thrombosis of bilateral anterior cerebral arteries: Secondary | ICD-10-CM | POA: Diagnosis present

## 2017-06-11 DIAGNOSIS — E1122 Type 2 diabetes mellitus with diabetic chronic kidney disease: Secondary | ICD-10-CM | POA: Diagnosis not present

## 2017-06-11 DIAGNOSIS — R29818 Other symptoms and signs involving the nervous system: Secondary | ICD-10-CM | POA: Diagnosis not present

## 2017-06-11 DIAGNOSIS — Z9049 Acquired absence of other specified parts of digestive tract: Secondary | ICD-10-CM | POA: Diagnosis not present

## 2017-06-11 DIAGNOSIS — E785 Hyperlipidemia, unspecified: Secondary | ICD-10-CM | POA: Insufficient documentation

## 2017-06-11 DIAGNOSIS — N179 Acute kidney failure, unspecified: Secondary | ICD-10-CM | POA: Diagnosis not present

## 2017-06-11 DIAGNOSIS — Z7982 Long term (current) use of aspirin: Secondary | ICD-10-CM | POA: Insufficient documentation

## 2017-06-11 DIAGNOSIS — G459 Transient cerebral ischemic attack, unspecified: Secondary | ICD-10-CM | POA: Diagnosis not present

## 2017-06-11 DIAGNOSIS — I771 Stricture of artery: Secondary | ICD-10-CM | POA: Insufficient documentation

## 2017-06-11 DIAGNOSIS — I639 Cerebral infarction, unspecified: Secondary | ICD-10-CM

## 2017-06-11 DIAGNOSIS — Z801 Family history of malignant neoplasm of trachea, bronchus and lung: Secondary | ICD-10-CM | POA: Diagnosis not present

## 2017-06-11 DIAGNOSIS — Z87891 Personal history of nicotine dependence: Secondary | ICD-10-CM | POA: Diagnosis not present

## 2017-06-11 DIAGNOSIS — Z951 Presence of aortocoronary bypass graft: Secondary | ICD-10-CM | POA: Insufficient documentation

## 2017-06-11 DIAGNOSIS — Z9889 Other specified postprocedural states: Secondary | ICD-10-CM | POA: Insufficient documentation

## 2017-06-11 DIAGNOSIS — N184 Chronic kidney disease, stage 4 (severe): Secondary | ICD-10-CM | POA: Insufficient documentation

## 2017-06-11 DIAGNOSIS — Z8673 Personal history of transient ischemic attack (TIA), and cerebral infarction without residual deficits: Secondary | ICD-10-CM | POA: Diagnosis present

## 2017-06-11 DIAGNOSIS — E119 Type 2 diabetes mellitus without complications: Secondary | ICD-10-CM | POA: Diagnosis not present

## 2017-06-11 DIAGNOSIS — I63233 Cerebral infarction due to unspecified occlusion or stenosis of bilateral carotid arteries: Secondary | ICD-10-CM | POA: Diagnosis not present

## 2017-06-11 DIAGNOSIS — Z794 Long term (current) use of insulin: Secondary | ICD-10-CM | POA: Insufficient documentation

## 2017-06-11 DIAGNOSIS — N400 Enlarged prostate without lower urinary tract symptoms: Secondary | ICD-10-CM | POA: Insufficient documentation

## 2017-06-11 DIAGNOSIS — Z833 Family history of diabetes mellitus: Secondary | ICD-10-CM | POA: Insufficient documentation

## 2017-06-11 DIAGNOSIS — Z85038 Personal history of other malignant neoplasm of large intestine: Secondary | ICD-10-CM | POA: Diagnosis not present

## 2017-06-11 LAB — CBC
HEMATOCRIT: 40.4 % (ref 40.0–52.0)
HEMOGLOBIN: 13.6 g/dL (ref 13.0–18.0)
MCH: 30.8 pg (ref 26.0–34.0)
MCHC: 33.6 g/dL (ref 32.0–36.0)
MCV: 91.5 fL (ref 80.0–100.0)
Platelets: 197 10*3/uL (ref 150–440)
RBC: 4.41 MIL/uL (ref 4.40–5.90)
RDW: 13.8 % (ref 11.5–14.5)
WBC: 8.5 10*3/uL (ref 3.8–10.6)

## 2017-06-11 LAB — COMPREHENSIVE METABOLIC PANEL
ALK PHOS: 161 U/L — AB (ref 38–126)
ALT: 70 U/L — ABNORMAL HIGH (ref 17–63)
ANION GAP: 6 (ref 5–15)
AST: 58 U/L — ABNORMAL HIGH (ref 15–41)
Albumin: 2.8 g/dL — ABNORMAL LOW (ref 3.5–5.0)
BILIRUBIN TOTAL: 0.4 mg/dL (ref 0.3–1.2)
BUN: 58 mg/dL — AB (ref 6–20)
CALCIUM: 8.3 mg/dL — AB (ref 8.9–10.3)
CO2: 19 mmol/L — ABNORMAL LOW (ref 22–32)
Chloride: 111 mmol/L (ref 101–111)
Creatinine, Ser: 3.96 mg/dL — ABNORMAL HIGH (ref 0.61–1.24)
GFR calc Af Amer: 15 mL/min — ABNORMAL LOW (ref >60)
GFR, EST NON AFRICAN AMERICAN: 13 mL/min — AB (ref >60)
Glucose, Bld: 166 mg/dL — ABNORMAL HIGH (ref 65–99)
POTASSIUM: 5 mmol/L (ref 3.5–5.1)
Sodium: 136 mmol/L (ref 135–145)
TOTAL PROTEIN: 6.6 g/dL (ref 6.5–8.1)

## 2017-06-11 LAB — TROPONIN I: Troponin I: 0.03 ng/mL (ref <0.03)

## 2017-06-11 LAB — DIFFERENTIAL
Basophils Absolute: 0.1 10*3/uL (ref 0–0.1)
Basophils Relative: 1 %
EOS ABS: 0.4 10*3/uL (ref 0–0.7)
EOS PCT: 4 %
LYMPHS ABS: 3 10*3/uL (ref 1.0–3.6)
LYMPHS PCT: 35 %
MONOS PCT: 9 %
Monocytes Absolute: 0.7 10*3/uL (ref 0.2–1.0)
Neutro Abs: 4.4 10*3/uL (ref 1.4–6.5)
Neutrophils Relative %: 51 %

## 2017-06-11 LAB — GLUCOSE, CAPILLARY
GLUCOSE-CAPILLARY: 154 mg/dL — AB (ref 65–99)
GLUCOSE-CAPILLARY: 131 mg/dL — AB (ref 65–99)

## 2017-06-11 LAB — APTT: aPTT: 31 seconds (ref 24–36)

## 2017-06-11 LAB — PROTIME-INR
INR: 1.07
Prothrombin Time: 13.8 seconds (ref 11.4–15.2)

## 2017-06-11 LAB — ETHANOL: Alcohol, Ethyl (B): <5 mg/dL (ref <5)

## 2017-06-11 MED ORDER — SODIUM CHLORIDE 0.9 % IV SOLN
INTRAVENOUS | Status: DC
  Administered 2017-06-11: 23:00:00 via INTRAVENOUS

## 2017-06-11 MED ORDER — SODIUM BICARBONATE 650 MG PO TABS
650.0000 mg | ORAL_TABLET | Freq: Two times a day (BID) | ORAL | Status: DC
  Administered 2017-06-11 – 2017-06-14 (×5): 650 mg via ORAL
  Filled 2017-06-11 (×7): qty 1

## 2017-06-11 MED ORDER — STROKE: EARLY STAGES OF RECOVERY BOOK
Freq: Once | Status: AC
  Administered 2017-06-11: 23:00:00

## 2017-06-11 MED ORDER — ACETAMINOPHEN 650 MG RE SUPP: 650.0000 mg | RECTAL | Status: DC | PRN

## 2017-06-11 MED ORDER — LEVOTHYROXINE SODIUM 100 MCG PO TABS
300.0000 ug | ORAL_TABLET | Freq: Every day | ORAL | Status: DC
  Administered 2017-06-12 – 2017-06-14 (×2): 300 ug via ORAL
  Filled 2017-06-11 (×2): qty 3

## 2017-06-11 MED ORDER — INSULIN REGULAR HUMAN 100 UNIT/ML IJ SOLN: 15.0000 [IU] | Freq: Two times a day (BID) | INTRAMUSCULAR | Status: DC

## 2017-06-11 MED ORDER — ENOXAPARIN SODIUM 30 MG/0.3ML ~~LOC~~ SOLN
30.0000 mg | SUBCUTANEOUS | Status: DC
  Administered 2017-06-11: 23:00:00 30 mg via SUBCUTANEOUS
  Filled 2017-06-11: qty 0.3

## 2017-06-11 MED ORDER — INSULIN ASPART 100 UNIT/ML ~~LOC~~ SOLN
0.0000 [IU] | Freq: Three times a day (TID) | SUBCUTANEOUS | Status: DC
  Administered 2017-06-12 – 2017-06-14 (×4): 3 [IU] via SUBCUTANEOUS
  Filled 2017-06-11 (×4): qty 1

## 2017-06-11 MED ORDER — ACETAMINOPHEN 160 MG/5ML PO SOLN
650.0000 mg | ORAL | Status: DC | PRN
  Filled 2017-06-11: qty 20.3

## 2017-06-11 MED ORDER — ASPIRIN EC 325 MG PO TBEC
325.0000 mg | DELAYED_RELEASE_TABLET | Freq: Every day | ORAL | Status: DC
  Administered 2017-06-12 – 2017-06-14 (×2): 325 mg via ORAL
  Filled 2017-06-11 (×2): qty 1

## 2017-06-11 MED ORDER — SIMVASTATIN 20 MG PO TABS: 20.0000 mg | ORAL_TABLET | Freq: Every day | ORAL | Status: DC

## 2017-06-11 MED ORDER — ACETAMINOPHEN 325 MG PO TABS: 650.0000 mg | ORAL_TABLET | ORAL | Status: DC | PRN

## 2017-06-11 MED ORDER — INSULIN DETEMIR 100 UNIT/ML ~~LOC~~ SOLN
30.0000 [IU] | Freq: Two times a day (BID) | SUBCUTANEOUS | Status: DC
  Administered 2017-06-12 – 2017-06-14 (×4): 30 [IU] via SUBCUTANEOUS
  Filled 2017-06-11 (×7): qty 0.3

## 2017-06-11 MED ORDER — INSULIN ASPART 100 UNIT/ML ~~LOC~~ SOLN
0.0000 [IU] | Freq: Every day | SUBCUTANEOUS | Status: DC
  Administered 2017-06-12: 22:00:00 2 [IU] via SUBCUTANEOUS
  Filled 2017-06-11: qty 1

## 2017-06-11 MED ORDER — SENNOSIDES-DOCUSATE SODIUM 8.6-50 MG PO TABS: 1.0000 | ORAL_TABLET | Freq: Every evening | ORAL | Status: DC | PRN

## 2017-06-11 NOTE — ED Notes (Signed)
Admitting DR at bedside

## 2017-06-11 NOTE — ED Notes (Signed)
Neurologist on tele neuro monitor talking to pt and pts family at this time.

## 2017-06-11 NOTE — H&P (Signed)
[facility] - [place] @ [facility] Admission History and Physical [name], D.O.  ---------------------------------------------------------------------------------------------------------------------   PATIENT NAME: [name] MR#: [id number] DATE OF BIRTH: Jul 09, 1941 DATE OF ADMISSION: 06/11/2017 PRIMARY CARE PHYSICIAN: Reubin Milan, MD  REQUESTING/REFERRING PHYSICIAN: ED Dr. Sharma Covert  CHIEF COMPLAINT: Chief Complaint  Patient presents with  . Transient Ischemic Attack    HISTORY OF PRESENT ILLNESS: [name] is a 76 y.o. male with a known history of CAD s/p triple bypass, DM, HTN, HLD, hypothyroidism was in a usual state of health until 8 am today (>12hours ago) when he developed left hand and arm weakness associated with tingling of the left face, left facial droop and slurred spech.  He states he could not hold on to his keys. At this time he feels his weakness is better but his face is still numb.   He takes aspirin daily.   Otherwise there has been no change in status. Patient has been taking medication as prescribed and there has been no recent change in medication or diet.  There has been no recent illness, travel or sick contacts.    Patient denies headache, gait ataxia, fevers/chills, weakness, dizziness, chest pain, shortness of breath, N/V/C/D, abdominal pain, dysuria/frequency, changes in mental status.   EMS/ED COURSE:  Medical admission was requested for further workup and management of acute CVA   PAST MEDICAL HISTORY: Past Medical History:  Diagnosis Date  . Cancer (HCC)   . Coronary artery disease   . Diabetes mellitus without complication (HCC)   . Hyperlipidemia   . Hypertension   . Thyroid disease       PAST SURGICAL HISTORY: Past Surgical History:  Procedure Laterality Date  . APPENDECTOMY  2011  . CHOLECYSTECTOMY N/A 04/23/2016   Procedure: LAPAROSCOPIC CHOLECYSTECTOMY;  Surgeon: Tiney Rouge III, MD;  Location: [facility] ORS;  Service:  General;  Laterality: N/A;  . COLON SURGERY  2011   Colectomy for ileo-cecal valve cancer  . CORONARY ARTERY BYPASS GRAFT  1997   x 3  . HERNIA REPAIR  2011   Ventral hernia  . KNEE SURGERY Left   . PROSTATE SURGERY  2002   BPH benign pathology  . SPINE SURGERY  1989   Lumbar disc      SOCIAL HISTORY: Social History  Substance Use Topics  . Smoking status: Former Smoker    Types: Cigarettes    Quit date: 06/24/1996  . Smokeless tobacco: Never Used  . Alcohol use No      FAMILY HISTORY: Family History  Problem Relation Age of Onset  . Cancer Mother        Lung  . Diabetes Mother   . Heart disease Mother      MEDICATIONS AT HOME: Prior to Admission medications   Medication Sig Start Date End Date Taking? Authorizing Provider  aspirin EC 325 MG tablet Take 325 mg by mouth daily.   Yes [provider]  atenolol (TENORMIN) 25 MG tablet TAKE ONE TABLET BY MOUTH ONCE DAILY 01/17/17  Yes Reubin Milan, MD  insulin NPH Human (HUMULIN N,NOVOLIN N) 100 UNIT/ML injection Inject 30 Units into the skin 2 (two) times daily.   Yes [provider]  insulin regular (NOVOLIN R,HUMULIN R) 100 units/mL injection Inject 15-16 Units into the skin 2 (two) times daily. Pt takes depending on blood sugar.   Yes [provider]  levothyroxine (SYNTHROID, LEVOTHROID) 300 MCG tablet Take 300 mcg by mouth daily before breakfast.   Yes [provider]  simvastatin (ZOCOR) 20 MG tablet TAKE ONE TABLET BY MOUTH AT BEDTIME 03/03/17  Yes Reubin Milan, MD  sodium bicarbonate 650 MG tablet Take 1 tablet by mouth 2 (two) times daily. 04/28/17 04/28/18 Yes [provider]  Blood Glucose Monitoring Suppl (RELION PRIME MONITOR) DEVI  01/29/14   [provider]  glucose blood (RELION PRIME TEST) test strip  02/20/15   [provider]  mupirocin cream (BACTROBAN) 2 % Apply 1 application topically 2 (two) times daily. Patient not taking: Reported on  06/11/2017 07/22/16   Reubin Milan, MD      DRUG ALLERGIES: Allergies  Allergen Reactions  . Ace Inhibitors Other (See Comments)    Reaction:  Raises potassium   . Quinapril Rash    hyperkalemia     REVIEW OF SYSTEMS: CONSTITUTIONAL: No fever/chills, fatigue, weakness, weight gain/loss, headache EYES: No blurry or double vision. ENT: No tinnitus, postnasal drip, redness or soreness of the oropharynx. RESPIRATORY: No cough, wheeze, hemoptysis, dyspnea. CARDIOVASCULAR: No chest pain, orthopnea, palpitations, syncope. GASTROINTESTINAL: No nausea, vomiting, constipation, diarrhea, abdominal pain, hematemesis, melena or hematochezia. GENITOURINARY: No dysuria or hematuria. ENDOCRINE: No polyuria or nocturia. No heat or cold intolerance. HEMATOLOGY: No anemia, bruising, bleeding. INTEGUMENTARY: No rashes, ulcers, lesions. MUSCULOSKELETAL: No arthritis, swelling, gout. NEUROLOGIC: Positive numbness, tingling, weakness. No ataxia. No seizure-type activity. PSYCHIATRIC: No anxiety, depression, insomnia.  PHYSICAL EXAMINATION: VITAL SIGNS: Blood pressure (!) 115/96, pulse 67, temperature 98.9 F (37.2 C), temperature source Oral, resp. rate 20, height 6\' 2"  (1.88 m), weight 106.6 kg (235 lb), SpO2 99 %.  GENERAL: 76 y.o.-year-old white male patient, well-developed, well-nourished lying in the bed in no acute distress.  Pleasant and cooperative.   HEENT: Head atraumatic, normocephalic. Pupils equal, round, reactive to light and accommodation. No scleral icterus. Extraocular muscles intact. Nares are patent. Oropharynx is clear. Mucus membranes moist. NECK: Supple, full range of motion. No JVD, no bruit heard. No thyroid enlargement, no tenderness, no cervical lymphadenopathy. CHEST: Normal breath sounds bilaterally. No wheezing, rales, rhonchi or crackles. No use of accessory muscles of respiration.  No reproducible chest wall tenderness.  CARDIOVASCULAR: S1, S2 normal. No murmurs,  rubs, or gallops. Cap refill <2 seconds. ABDOMEN: Soft, nontender, nondistended. No rebound, guarding, rigidity. Normoactive bowel sounds present in all four quadrants. No organomegaly or mass. EXTREMITIES: Full range of motion. No pedal edema, cyanosis, or clubbing. NEUROLOGIC: Cranial nerves II through XII are grossly intact with no focal sensorimotor deficit. Muscle strength 5/5 in all extremities. Sensation intact. Gait not checked. Speech intact.  PSYCHIATRIC: The patient is alert and oriented x 3. Normal affect, mood, thought content. SKIN: Warm, dry, and intact without obvious rash, lesion, or ulcer.  LABORATORY PANEL:  CBC  Recent Labs Lab 06/11/17 1757  WBC 8.5  HGB 13.6  HCT 40.4  PLT 197   ----------------------------------------------------------------------------------------------------------------- Chemistries  Recent Labs Lab 06/11/17 1757  NA 136  K 5.0  CL 111  CO2 19*  GLUCOSE 166*  BUN 58*  CREATININE 3.96*  CALCIUM 8.3*  AST 58*  ALT 70*  ALKPHOS 161*  BILITOT 0.4   ------------------------------------------------------------------------------------------------------------------ Cardiac Enzymes  Recent Labs Lab 06/11/17 1757  TROPONINI 0.03*   ------------------------------------------------------------------------------------------------------------------  RADIOLOGY: Mr Brain Wo Contrast  Result Date: 06/11/2017 CLINICAL DATA:  76 y/o  M; TIA, initial exam.  Left-sided symptoms. EXAM: MRI HEAD WITHOUT CONTRAST TECHNIQUE: Multiplanar, multiecho pulse sequences of the brain and surrounding structures were obtained without intravenous contrast. COMPARISON:  06/11/2017 CT of the  head. FINDINGS: Brain: Punctate focus of of diffusion hyperintensity within left high parietal lobe with uncertain reduced diffusion may represent a recent infarction. No additional evidence for acute/early subacute infarction of the brain. A nonspecific T2 FLAIR  hyperintense signal abnormality predominantly in periventricular white matter is compatible with the mild chronic microvascular ischemic changes for age and there is mild brain parenchymal volume loss. Small chronic infarcts are present within the right mid corona radiata, right frontal centrum semiovale, and the right cerebellar hemisphere. The there are punctate foci of susceptibility hypointensity in the right parietal cortex, right lateral frontal subcortical white matter, and right posterolateral temporal lobe compatible hemosiderin deposition of chronic microhemorrhage. Vascular: Loss of the left internal carotid artery flow void upper cervical, petrous, and cavernous segments may represent slow flow or occlusion. Persistent flow voids are present within the central circle of Willis and large dural venous sinuses. Skull and upper cervical spine: Normal marrow signal. Sinuses/Orbits: Negative. Other: Negative. IMPRESSION: 1. Possible recent left high parietal punctate cortical infarction. No additional evidence for acute/early subacute infarction of the brain. 2. Loss of the left internal carotid artery upper cervical, petrous, and cavernous segment flow void may represent slow flow or occlusion. 3. Mild for age chronic microvascular ischemic changes and mild parenchymal volume loss of the brain. Small chronic lacunar infarcts in right corona radiata, right frontal centrum semiovale, and right cerebellar hemisphere. These results were called by telephone at the time of interpretation on 06/11/2017 at 8:09 pm to Dr. Rockne Menghini , who verbally acknowledged these results. Electronically Signed   By: Mitzi Hansen M.D.   On: 06/11/2017 20:11   Ct Head Code Stroke Wo Contrast`  Result Date: 06/11/2017 CLINICAL DATA:  Code stroke. 76 y/o M; left hand weakness, left facial droop, left facial numbness, slurred speech. Initial exam. EXAM: CT HEAD WITHOUT CONTRAST TECHNIQUE: Contiguous axial images  were obtained from the base of the skull through the vertex without intravenous contrast. COMPARISON:  11/06/2016 CT of the head. FINDINGS: Brain: No acute vascular territory infarction, intracranial hemorrhage, or focal mass effect. Interval development of small lucencies in the right mid corona radiata and right frontal centrum semiovale probably represent chronic small vessel infarctions and small stable chronic lacunar infarct in the right cerebellar hemisphere. Mild interval increase in brain parenchymal volume loss and chronic microvascular ischemic changes. Vascular: Calcific atherosclerosis of carotid siphons. No hyperdense vessel identified. Skull: Normal. Negative for fracture or focal lesion. Sinuses/Orbits: No acute finding. Other: None. ASPECTS [facility] Stroke Program Early CT Score) - Ganglionic level infarction (caudate, lentiform nuclei, internal capsule, insula, M1-M3 cortex): 7 - Supraganglionic infarction (M4-M6 cortex): 3 Total score (0-10 with 10 being normal): 10 IMPRESSION: 1. No large acute infarct, intracranial hemorrhage, or focal mass effect. 2. ASPECTS is 10 3. Interval progression of chronic microvascular ischemic changes and parenchymal volume loss of the brain. These results were called by telephone at the time of interpretation on 06/11/2017 at 6:20 pm to Dr. Sharma Covert, who verbally acknowledged these results. Electronically Signed   By: Mitzi Hansen M.D.   On: 06/11/2017 18:21    EKG: NSR@ 71bpm, normal axis, nonspecific ST-T wave changes  IMPRESSION AND PLAN:  This is a 76 y.o. male with a history of CAD s/p triple bypass, DM, HTN, HLD, hypothyroidism now being admitted with:  1. Left sided weakness /facial droop possible CVA -  Possible recent left high parietal punctate cortical infarction, not consistent with symptoms - Admit telemetry observation for neuro workup including: -  Studies:  Echo, Carotids - Labs: CBC, BMP, Lipids, TFTs, A1C - Nursing:  Neurochecks, O2, dysphagia screen, permissive hypertension.  - Consults: Neurology, PT/OT, S/S consults.  - Meds: Daily aspirin 325mg  per home regimen - Fluids: IVNS@75cc /hr.   - Routine DVT Px: with Lovenox, SCDs, early ambulation  2. Elevated troponin in the setting of CKD, nonischemic EKG and no chest pain.  - Monitor on tele - Trend trops - Continue aspirin  3. AKI on CKD - gentle IVF hydration and recheck BMP in AM.   4. History of HLD - continue zocor  5. History of HTN - hold atenolol for now  6. History of DM - continue Humulin.  Accuchecks achs with RISS coverage  7. History of hypothyroidism - continue synthroid.  Code Status: Full  All the records are reviewed and case discussed with ED provider. Management plans discussed with the patient and/or family who express understanding and agree with plan of care.   TOTAL TIME TAKING CARE OF THIS PATIENT: 60 minutes.   Jala Dundon D.O. on 06/11/2017 at 9:02 PM Between 7am to 6pm - Pager - [phone] After 6pm go to [email or web address] - Social research officer, government [facility] [place] [facility] Office [phone] CC: Primary care physician; Reubin Milan, MD     Note: This dictation was prepared with Dragon dictation along with smaller phrase technology. Any transcriptional errors that result from this process are unintentional.

## 2017-06-11 NOTE — ED Notes (Signed)
Pt states at 0830 this AM when he woke up he had left sided tingling and weakness

## 2017-06-11 NOTE — ED Notes (Signed)
Patient returned from MRI. Placed back on monitor by this RN and MRI tech. Will continue to monitor patient

## 2017-06-11 NOTE — ED Provider Notes (Addendum)
[facility] Emergency Department Provider Note  ____________________________________________  Time seen: Approximately 6:21 PM  I have reviewed the triage vital signs and the nursing notes.   HISTORY  Chief Complaint Transient Ischemic Attack    HPI ELRIDGE STEMM is a 76 y.o. male , left-handed, with a history of CAD status post MI, HTN, HL, DM presenting with left upper extremity weakness and numbness, and left facial numbness with droop. The patient reports that he woke up this morning andand dropped his keys multiple times due to left hand weakness and numbness. He then noted that he was numb on the left side of the face with a facial droop. Both of these symptoms come and go and have improved at this time. Additionally he had slurred speech. The patient denies any headache, blurred or double vision, lower extremity involvement. No recent illness or significant medication changes. The patient took a full aspirin which is part of his daily medications this morning. Blood sugar is reassuring in the emergency department.   Past Medical History:  Diagnosis Date  . Cancer ([place])   . Coronary artery disease   . Diabetes mellitus without complication ([place])   . Hyperlipidemia   . Hypertension   . Thyroid disease     Patient Active Problem List   Diagnosis Date Noted  . Hyperglycemia 12/25/2016  . Chronic shoulder bursitis, left 07/22/2016  . Cholecystitis, acute 04/21/2016  . 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 CKD stage 4 due to type 2 diabetes mellitus ([place]) 03/16/2015  . Diabetes mellitus with polyneuropathy ([place]) 03/16/2015  . CAD in native artery 01/20/2015  . Gout 01/20/2015  . Diabetic peripheral neuropathy ([place]) 01/20/2015  . H/O malignant neoplasm of colon 01/20/2015  . Adult hypothyroidism 01/20/2015  .  Hyperlipidemia associated with type 2 diabetes mellitus ([place]) 01/20/2015  . Encounter for long-term (current) use of insulin ([place]) 07/27/2014  . Disorder affecting the body's metabolism 07/27/2014  . High potassium 05/19/2014  . 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 Benign essential HTN 04/14/2014  . Hypertension in stage 4 chronic kidney disease due to type 2 diabetes mellitus ([place]) 07/15/2013    Past Surgical History:  Procedure Laterality Date  . APPENDECTOMY  2011  . CHOLECYSTECTOMY N/A 04/23/2016   Procedure: LAPAROSCOPIC CHOLECYSTECTOMY;  Surgeon: [name], MD;  Location: [facility] ORS;  Service: General;  Laterality: N/A;  . COLON SURGERY  2011   Colectomy for ileo-cecal valve cancer  . CORONARY ARTERY BYPASS GRAFT  1997   x 3  . HERNIA REPAIR  2011   Ventral hernia  . KNEE SURGERY Left   . PROSTATE SURGERY  2002   BPH benign pathology  . SPINE SURGERY  1989   Lumbar disc    Current Outpatient Rx  . Order #: [id number] Class: Historical Med  . Order #: [id number] Class: Normal  . Order #: [id number] Class: Historical Med  . Order #: [id number] Class: Historical Med  . Order #: [id number] Class: Historical Med  . Order #: [id number] Class: Normal  . Order #: [id number] Class: Historical Med  . Order #: [id number] Class: Historical Med  . Order #: [id number] Class: Historical Med  . Order #: [id number] Class: Normal    Allergies Ace inhibitors and Quinapril  Family History  Problem Relation Age of Onset  . Cancer Mother        Lung  .  Diabetes Mother   . Heart disease Mother     Social History Social History  Substance Use Topics  . Smoking status: Former Smoker    Types: Cigarettes    Quit date: 06/24/1996  . Smokeless tobacco: Never Used  . Alcohol use No    Review of Systems Constitutional: No fever/chills.No lightheadedness or syncope.  Eyes: No visual changes. No blurred or double vision. ENT: No sore throat. No congestion  or rhinorrhea. Cardiovascular: Denies chest pain. Denies palpitations. Respiratory: Denies shortness of breath.  No cough. Gastrointestinal: No abdominal pain.  No nausea, no vomiting.  No diarrhea.  No constipation. Genitourinary: Negative for dysuria. Musculoskeletal: Negative for back pain. Skin: Negative for rash. Neurological: Negative for headaches. Her changes in vision or speech. No confusion. Positive slurred speech. Positive left upper extremity weakness and numbness. Positive left facial droop. Positive left cheek numbness. No difficulty with walking.    ____________________________________________   PHYSICAL EXAM:  VITAL SIGNS: ED Triage Vitals [06/11/17 1755]  Enc Vitals Group     BP (!) 113/99     Pulse Rate 72     Resp 15     Temp 98.9 F (37.2 C)     Temp Source Oral     SpO2 99 %     Weight 235 lb (106.6 kg)     Height 6\' 2"  (1.88 m)     Head Circumference      Peak Flow      Pain Score      Pain Loc      Pain Edu?      Excl. in [place]?     Constitutional: Alert and oriented. Well appearing and in no acute distress. Answers questions appropriately. Eyes: Conjunctivae are normal.  EOMI. PERRLA. No scleral icterus. Head: Atraumatic. Nose: No congestion/rhinnorhea. Mouth/Throat: Mucous membranes are moist.  Neck: No stridor.  Supple.  No JVD. Cardiovascular: Normal rate, regular rhythm. No murmurs, rubs or gallops.  Respiratory: Normal respiratory effort.  No accessory muscle use or retractions. Lungs CTAB.  No wheezes, rales or ronchi. Gastrointestinal: Soft, nontender and nondistended.  No guarding or rebound.  No peritoneal signs. Musculoskeletal: No LE edema.  Neurologic: Alert and oriented 3. Speech is mildly slurred. Smile is grossly symmetric but he does have some loss of the left nasal labial fold. Tongue is midline.No pronator drift. 5 out of 5 grip, biceps, triceps, hip flexors, plantar flexion and dorsiflexion. Normal sensation to light touch in the  bilateral upper and lower extremities, and face. Normal heel-to-shin. Skin:  Skin is warm, dry and intact. No rash noted. Psychiatric: Mood and affect are normal. Speech and behavior are normal.  Normal judgement.  ____________________________________________   LABS (all labs ordered are listed, but only abnormal results are displayed)  Labs Reviewed  COMPREHENSIVE METABOLIC PANEL - Abnormal; Notable for the following:       Result Value   CO2 19 (*)    Glucose, Bld 166 (*)    BUN 58 (*)    Creatinine, Ser 3.96 (*)    Calcium 8.3 (*)    Albumin 2.8 (*)    AST 58 (*)    ALT 70 (*)    Alkaline Phosphatase 161 (*)    GFR calc non Af Amer 13 (*)    GFR calc Af Amer 15 (*)    All other components within normal limits  TROPONIN I - Abnormal; Notable for the following:    Troponin I 0.03 (*)    All  other components within normal limits  GLUCOSE, CAPILLARY - Abnormal; Notable for the following:    Glucose-Capillary 154 (*)    All other components within normal limits  PROTIME-INR  APTT  CBC  DIFFERENTIAL  ETHANOL  URINALYSIS, COMPLETE (UACMP) WITH MICROSCOPIC  CBG MONITORING, ED   ____________________________________________  EKG  ED ECG REPORT I, Eula Listen, the attending physician, personally viewed and interpreted this ECG.   Date: 06/11/2017  EKG Time: 1752  Rate: 71  Rhythm: normal sinus rhythm  Axis: normal  Intervals:none  ST&T Change: No STEMI  ____________________________________________  RADIOLOGY  Ct Head Code Stroke Wo Contrast`  Result Date: 06/11/2017 CLINICAL DATA:  Code stroke. 76 y/o M; left hand weakness, left facial droop, left facial numbness, slurred speech. Initial exam. EXAM: CT HEAD WITHOUT CONTRAST TECHNIQUE: Contiguous axial images were obtained from the base of the skull through the vertex without intravenous contrast. COMPARISON:  11/06/2016 CT of the head. FINDINGS: Brain: No acute vascular territory infarction, intracranial  hemorrhage, or focal mass effect. Interval development of small lucencies in the right mid corona radiata and right frontal centrum semiovale probably represent chronic small vessel infarctions and small stable chronic lacunar infarct in the right cerebellar hemisphere. Mild interval increase in brain parenchymal volume loss and chronic microvascular ischemic changes. Vascular: Calcific atherosclerosis of carotid siphons. No hyperdense vessel identified. Skull: Normal. Negative for fracture or focal lesion. Sinuses/Orbits: No acute finding. Other: None. ASPECTS [facility] Stroke Program Early CT Score) - Ganglionic level infarction (caudate, lentiform nuclei, internal capsule, insula, M1-M3 cortex): 7 - Supraganglionic infarction (M4-M6 cortex): 3 Total score (0-10 with 10 being normal): 10 IMPRESSION: 1. No large acute infarct, intracranial hemorrhage, or focal mass effect. 2. ASPECTS is 10 3. Interval progression of chronic microvascular ischemic changes and parenchymal volume loss of the brain. These results were called by telephone at the time of interpretation on 06/11/2017 at 6:20 pm to Dr. Mariea Clonts, who verbally acknowledged these results. Electronically Signed   By: Kristine Garbe M.D.   On: 06/11/2017 18:21    ____________________________________________   PROCEDURES  Procedure(s) performed: None  Procedures  Critical Care performed: Yes ____________________________________________   INITIAL IMPRESSION / ASSESSMENT AND PLAN / ED COURSE  Pertinent labs & imaging results that were available during my care of the patient were reviewed by me and considered in my medical decision making (see chart for details).  76 y.o. male who woke up with left arm and left face numbness and weakness, associated with slurred speech. I am concerned that the patient is having an acute stroke, but he is not a candidate for TPA given the unknown onset of symptoms. At this time, the patient is  hemodynamically stable we will evaluate for other possible causes including electrolyte dysfunction, infection. The patient has undergone CT examination which did not show any acute stroke but did have signs of possible old strokes. An MRI has been ordered as well as a teleneurology consult. The patient has already taken an aspirin today. Plan admission.  ----------------------------------------- 7:29 PM on 06/11/2017 -----------------------------------------  Patient has an elevated troponin in the setting of renal insufficiency. He has been chest pain-free, and took an aspirin this morning. This will be monitored by the admitting physicians.  CRITICAL CARE Performed by: Eula Listen   Total critical care time: 35 minutes  Critical care time was exclusive of separately billable procedures and treating other patients.  Critical care was necessary to treat or prevent imminent or life-threatening deterioration.  Critical care was  time spent personally by me on the following activities: development of treatment plan with patient and/or surrogate as well as nursing, discussions with consultants, evaluation of patient's response to treatment, examination of patient, obtaining history from patient or surrogate, ordering and performing treatments and interventions, ordering and review of laboratory studies, ordering and review of radiographic studies, pulse oximetry and re-evaluation of patient's condition.   ____________________________________________  FINAL CLINICAL IMPRESSION(S) / ED DIAGNOSES  Final diagnoses:  Cerebrovascular accident (CVA), unspecified mechanism ([place])  Elevated troponin         NEW MEDICATIONS STARTED DURING THIS VISIT:  New Prescriptions   No medications on file      Eula Listen, MD 06/11/17 1830    Eula Listen, MD 06/11/17 [id number]

## 2017-06-11 NOTE — ED Notes (Signed)
  Neurologist updating family and pt on plan of care. Pt verbalized understanding of admission and further testing.

## 2017-06-11 NOTE — ED Notes (Signed)
Patient transported to MRI 

## 2017-06-11 NOTE — ED Triage Notes (Signed)
Patient presents to ED via POV from home. Patient states he went to bed last night at 2200 and felt fine. He woke up this morning at 0800 with  left hand grasp weakness (dropped his keys), left facial droop, left facial numbness and slurred speech. Symptoms have resolved.

## 2017-06-12 ENCOUNTER — Observation Stay
Admit: 2017-06-12 | Discharge: 2017-06-12 | Disposition: A | Discharge status: Home / Self Care | Payer: PPO | Attending: Family Medicine | Admitting: Family Medicine

## 2017-06-12 ENCOUNTER — Observation Stay: Payer: PPO

## 2017-06-12 DIAGNOSIS — R531 Weakness: Secondary | ICD-10-CM | POA: Diagnosis not present

## 2017-06-12 DIAGNOSIS — I10 Essential (primary) hypertension: Secondary | ICD-10-CM

## 2017-06-12 DIAGNOSIS — I6522 Occlusion and stenosis of left carotid artery: Secondary | ICD-10-CM | POA: Diagnosis not present

## 2017-06-12 DIAGNOSIS — N189 Chronic kidney disease, unspecified: Secondary | ICD-10-CM | POA: Diagnosis not present

## 2017-06-12 DIAGNOSIS — N179 Acute kidney failure, unspecified: Secondary | ICD-10-CM | POA: Diagnosis not present

## 2017-06-12 DIAGNOSIS — I6789 Other cerebrovascular disease: Secondary | ICD-10-CM | POA: Diagnosis not present

## 2017-06-12 DIAGNOSIS — E119 Type 2 diabetes mellitus without complications: Secondary | ICD-10-CM | POA: Diagnosis not present

## 2017-06-12 DIAGNOSIS — I639 Cerebral infarction, unspecified: Secondary | ICD-10-CM | POA: Diagnosis not present

## 2017-06-12 DIAGNOSIS — I638 Other cerebral infarction: Secondary | ICD-10-CM | POA: Diagnosis not present

## 2017-06-12 DIAGNOSIS — I6523 Occlusion and stenosis of bilateral carotid arteries: Secondary | ICD-10-CM | POA: Diagnosis not present

## 2017-06-12 DIAGNOSIS — R748 Abnormal levels of other serum enzymes: Secondary | ICD-10-CM | POA: Diagnosis not present

## 2017-06-12 LAB — GLUCOSE, CAPILLARY
GLUCOSE-CAPILLARY: 155 mg/dL — AB (ref 65–99)
Glucose-Capillary: 186 mg/dL — ABNORMAL HIGH (ref 65–99)
Glucose-Capillary: 168 mg/dL — ABNORMAL HIGH (ref 65–99)
Glucose-Capillary: 201 mg/dL — ABNORMAL HIGH (ref 65–99)

## 2017-06-12 LAB — LIPID PANEL
Cholesterol: 83 mg/dL (ref 0–200)
HDL: 13 mg/dL — AB (ref >40)
LDL CALC: 29 mg/dL (ref 0–99)
TRIGLYCERIDES: 206 mg/dL — AB (ref <150)
Total CHOL/HDL Ratio: 6.4 RATIO
VLDL: 41 mg/dL — AB (ref 0–40)

## 2017-06-12 LAB — URINALYSIS, COMPLETE (UACMP) WITH MICROSCOPIC
Bacteria, UA: NONE SEEN
Bilirubin Urine: NEGATIVE
GLUCOSE, UA: 150 mg/dL — AB
Ketones, ur: NEGATIVE mg/dL
Leukocytes, UA: NEGATIVE
Nitrite: NEGATIVE
PROTEIN: 100 mg/dL — AB
SPECIFIC GRAVITY, URINE: 1.013 (ref 1.005–1.030)
SQUAMOUS EPITHELIAL / LPF: NONE SEEN
pH: 5 (ref 5.0–8.0)

## 2017-06-12 LAB — URINE DRUG SCREEN, QUALITATIVE (ARMC ONLY)
AMPHETAMINES, UR SCREEN: NOT DETECTED
BARBITURATES, UR SCREEN: NOT DETECTED
BENZODIAZEPINE, UR SCRN: NOT DETECTED
Cannabinoid 50 Ng, Ur ~~LOC~~: NOT DETECTED
Cocaine Metabolite,Ur ~~LOC~~: NOT DETECTED
MDMA (Ecstasy)Ur Screen: NOT DETECTED
Methadone Scn, Ur: NOT DETECTED
OPIATE, UR SCREEN: NOT DETECTED
Phencyclidine (PCP) Ur S: NOT DETECTED
TRICYCLIC, UR SCREEN: NOT DETECTED

## 2017-06-12 LAB — HEMOGLOBIN A1C
HEMOGLOBIN A1C: 8.5 % — AB (ref 4.8–5.6)
Mean Plasma Glucose: 197.25 mg/dL

## 2017-06-12 LAB — TROPONIN I
TROPONIN I: 0.03 ng/mL — AB (ref <0.03)
TROPONIN I: 0.04 ng/mL — AB (ref <0.03)
TROPONIN I: 0.04 ng/mL — AB (ref <0.03)

## 2017-06-12 MED ORDER — ATENOLOL 25 MG PO TABS
25.0000 mg | ORAL_TABLET | Freq: Every day | ORAL | Status: DC
  Administered 2017-06-12: 25 mg via ORAL
  Filled 2017-06-12 (×2): qty 1

## 2017-06-12 MED ORDER — HEPARIN SODIUM (PORCINE) 5000 UNIT/ML IJ SOLN
5000.0000 [IU] | Freq: Three times a day (TID) | INTRAMUSCULAR | Status: DC
Start: 2017-06-12 — End: 2017-06-14
  Administered 2017-06-12 – 2017-06-14 (×4): 5000 [IU] via SUBCUTANEOUS
  Filled 2017-06-12 (×4): qty 1

## 2017-06-12 MED ORDER — CLOPIDOGREL BISULFATE 75 MG PO TABS
75.0000 mg | ORAL_TABLET | Freq: Every day | ORAL | Status: DC
  Administered 2017-06-12 – 2017-06-14 (×2): 75 mg via ORAL
  Filled 2017-06-12 (×2): qty 1

## 2017-06-12 MED ORDER — ATORVASTATIN CALCIUM 20 MG PO TABS
40.0000 mg | ORAL_TABLET | Freq: Every day | ORAL | Status: DC
  Administered 2017-06-12 – 2017-06-13 (×2): 40 mg via ORAL
  Filled 2017-06-12 (×2): qty 2

## 2017-06-12 NOTE — Evaluation (Signed)
Physical Therapy Evaluation Patient Details Name: [name] MRN: [id number] DOB: September 15, 1941 Today's Date: 06/12/2017   History of Present Illness  76 y.o. male pt with a known history of CAD s/p triple bypass, DM, HTN, HLD, hypothyroidism, and cancer was in a usual state of health until 8 am 8/29 when he developed left hand and arm weakness associated with tingling of the left face, left facial droop and slurred speech.  He states he could not hold on to his keys. Presented to ED, at which time he feels his weakness is better but his face is still numb. CT negative. MRI indicates possible recent L high parietal punctate cortical infarct, but symptoms are not consistent with this.     Clinical Impression  Pt admitted with above diagnosis. Pt currently with functional limitations due to the deficits listed below (see PT Problem List). Mr. Ericksen demonstrates [facility] BLE strength, coordination, sensation.  Reviewed signs/symtpoms of a stroke with pt and wife and what to do if signs/symptoms are noted. Pt demonstrated balance deficits with higher level balance activities and thus, recommending OPPT at d/c. Min guard provided for safety with ambulation.  Pt will benefit from skilled PT to increase their independence and safety with mobility to allow discharge to the venue listed below.      Follow Up Recommendations Outpatient PT (to address balance deficits)    Equipment Recommendations  None recommended by PT    Recommendations for Other Services       Precautions / Restrictions Precautions Precautions: Fall Restrictions Weight Bearing Restrictions: No      Mobility  Bed Mobility Overal bed mobility: Independent             General bed mobility comments: Pt performs independently without cues or assist required.   Transfers Overall transfer level: Independent Equipment used: None             General transfer comment: Demonstrates safe technique.  No signs of instability.   No physical assist or cues needed.  Ambulation/Gait Ambulation/Gait assistance: Min guard Ambulation Distance (Feet): 200 Feet Assistive device: None Gait Pattern/deviations: Step-through pattern   Gait velocity interpretation: at or above normal speed for age/gender General Gait Details: Pt steady ambulating on flat surface without balance challenges.  He demonstrates instability with higher level balance activities as documented below in pt's DGI assessment.    Stairs Stairs: Yes Stairs assistance: Min guard Stair Management: One rail Right;Forwards;Step to pattern;Alternating pattern Number of Stairs: 4 General stair comments: Pt demonstrates combination of alternating and step to pattern during ascent without use of railing.  When descending pt demonstrates step to pattern with R hand hovering over R rail and using rail intermittently for support.  Min guard provided for safety.    Wheelchair Mobility    Modified Rankin (Stroke Patients Only)       Balance Overall balance assessment: Needs assistance Sitting-balance support: No upper extremity supported;Feet supported Sitting balance-Leahy Scale: Good     Standing balance support: No upper extremity supported;During functional activity Standing balance-Leahy Scale: Fair Standing balance comment: Pt able to performs static and dynamic activities without losing his balance but would likely lose his balance with perturbation.                 Standardized Balance Assessment Standardized Balance Assessment : Dynamic Gait Index   Dynamic Gait Index Level Surface: Normal Change in Gait Speed: Normal Gait with Horizontal Head Turns: Mild Impairment Gait with Vertical Head Turns: Mild Impairment  Gait and Pivot Turn: Normal Step Over Obstacle: Normal Step Around Obstacles: Normal Steps: Moderate Impairment Total Score: 20       Pertinent Vitals/Pain Pain Assessment: No/denies pain    Home Living Family/patient  expects to be discharged to:: Private residence Living Arrangements: Spouse/significant other Available Help at Discharge: Family;Available 24 hours/day (adult children live next door) Type of Home: Mobile home Home Access: Stairs to enter Entrance Stairs-Rails: None Entrance Stairs-Number of Steps: 3 Home Layout: One level Home Equipment: Walker - 2 wheels;Cane - single point;Shower seat Additional Comments: has DME/AE from previous surgeries, does not need/use.    Prior Function Level of Independence: Independent         Comments: Pt independent with mobility, ADL, driving, and enjoys yardwork, watching tv, spending time with family, and also works as Marine scientist. No falls reported in past 12 months.      Hand Dominance   Dominant Hand: Left    Extremity/Trunk Assessment   Upper Extremity Assessment Upper Extremity Assessment: Defer to OT evaluation    Lower Extremity Assessment Lower Extremity Assessment: Overall WFL for tasks assessed    Cervical / Trunk Assessment Cervical / Trunk Assessment: Normal  Communication   Communication: No difficulties  Cognition Arousal/Alertness: Awake/alert Behavior During Therapy: WFL for tasks assessed/performed Overall Cognitive Status: Within Functional Limits for tasks assessed                                        General Comments General comments (skin integrity, edema, etc.): Reviewed with pt and wife all signs/symptoms of a stroke and what to do if these signs/symptoms are noted.  Pt scored 20/24 on the DGI indicating he is at an increased risk of falling.    Exercises Other Exercises Other Exercises: pt/spouse educated in benefits of a toilet riser to support more comfortable/safe toilet transfers, after both pt/spouse report slight difficulty/discomfort with transfers from regular height toilet, particularly for pt who is fairly tall and spouse who has knee pain. Pt/spouse verbalized understanding  and verbalized plan to have adult children look into getting one for them. Also educated in benefits of handheld shower head.   Assessment/Plan    PT Assessment Patient needs continued PT services  PT Problem List Decreased balance;Decreased safety awareness       PT Treatment Interventions DME instruction;Gait training;Stair training;Functional mobility training;Therapeutic activities;Balance training;Therapeutic exercise;Neuromuscular re-education;Patient/family education    PT Goals (Current goals can be found in the Care Plan section)  Acute Rehab PT Goals Patient Stated Goal: to go home PT Goal Formulation: With patient Time For Goal Achievement: 06/26/17 Potential to Achieve Goals: Good    Frequency 7X/week   Barriers to discharge        Co-evaluation               AM-PAC PT "6 Clicks" Daily Activity  Outcome Measure Difficulty turning over in bed (including adjusting bedclothes, sheets and blankets)?: None Difficulty moving from lying on back to sitting on the side of the bed? : None Difficulty sitting down on and standing up from a chair with arms (e.g., wheelchair, bedside commode, etc,.)?: None Help needed moving to and from a bed to chair (including a wheelchair)?: A Little Help needed walking in hospital room?: A Little Help needed climbing 3-5 steps with a railing? : A Little 6 Click Score: 21    End of Session Equipment  Utilized During Treatment: Gait belt Activity Tolerance: Patient tolerated treatment well Patient left: in bed;with call bell/phone within reach;with bed alarm set;with family/visitor present Nurse Communication: Mobility status PT Visit Diagnosis: Unsteadiness on feet (R26.81)    Time: 1036-1100 PT Time Calculation (min) (ACUTE ONLY): 24 min   Charges:   PT Evaluation $PT Eval Low Complexity: 1 Low PT Treatments $Gait Training: 8-22 mins   PT G Codes:   PT G-Codes **NOT FOR INPATIENT CLASS** Functional Assessment Tool Used:  AM-PAC 6 Clicks Basic Mobility;Clinical judgement Functional Limitation: Mobility: Walking and moving around Mobility: Walking and Moving Around Current Status (M1848): At least 20 percent but less than 40 percent impaired, limited or restricted Mobility: Walking and Moving Around Goal Status [id number]): At least 1 percent but less than 20 percent impaired, limited or restricted     Collie Siad PT, DPT 06/12/2017, 1:41 PM

## 2017-06-12 NOTE — Progress Notes (Signed)
[facility] NAME: [name]    MR#:  [id number]  DATE OF BIRTH:  1941-01-09  SUBJECTIVE:  CHIEF COMPLAINT:   Chief Complaint  Patient presents with  . Transient Ischemic Attack   Came with left-sided upper extremity weakness and numbness associated with speech problem and left-sided facial numbness. Symptoms resolved currently, MRI of the brain showed possibly new infarct. The patient has no symptoms.  REVIEW OF SYSTEMS:  CONSTITUTIONAL: No fever, fatigue or weakness.  EYES: No blurred or double vision.  EARS, NOSE, AND THROAT: No tinnitus or ear pain.  RESPIRATORY: No cough, shortness of breath, wheezing or hemoptysis.  CARDIOVASCULAR: No chest pain, orthopnea, edema.  GASTROINTESTINAL: No nausea, vomiting, diarrhea or abdominal pain.  GENITOURINARY: No dysuria, hematuria.  ENDOCRINE: No polyuria, nocturia,  HEMATOLOGY: No anemia, easy bruising or bleeding SKIN: No rash or lesion. MUSCULOSKELETAL: No joint pain or arthritis.   NEUROLOGIC: No tingling, numbness, weakness.  PSYCHIATRY: No anxiety or depression.   ROS  DRUG ALLERGIES:   Allergies  Allergen Reactions  . Ace Inhibitors Other (See Comments)    Reaction:  Raises potassium   . Quinapril Rash    hyperkalemia    VITALS:  Blood pressure (!) 145/90, pulse (!) 57, temperature (!) 97.5 F (36.4 C), temperature source Oral, resp. rate 18, height 6\' 2"  (1.88 m), weight 103.3 kg (227 lb 12.8 oz), SpO2 97 %.  PHYSICAL EXAMINATION:  GENERAL:  77 y.o.-year-old patient lying in the bed with no acute distress.  EYES: Pupils equal, round, reactive to light and accommodation. No scleral icterus. Extraocular muscles intact.  HEENT: Head atraumatic, normocephalic. Oropharynx and nasopharynx clear.  NECK:  Supple, no jugular venous distention. No thyroid enlargement, no tenderness.  LUNGS: Normal breath sounds bilaterally, no wheezing, rales,rhonchi or crepitation. No use  of accessory muscles of respiration.  CARDIOVASCULAR: S1, S2 normal. No murmurs, rubs, or gallops.  ABDOMEN: Soft, nontender, nondistended. Bowel sounds present. No organomegaly or mass.  EXTREMITIES: No pedal edema, cyanosis, or clubbing.  NEUROLOGIC: Cranial nerves II through XII are intact. Muscle strength 5/5 in all extremities. Sensation intact. Gait not checked.  PSYCHIATRIC: The patient is alert and oriented x 3.  SKIN: No obvious rash, lesion, or ulcer.   Physical Exam LABORATORY PANEL:   CBC  Recent Labs Lab 06/11/17 1757  WBC 8.5  HGB 13.6  HCT 40.4  PLT 197   ------------------------------------------------------------------------------------------------------------------  Chemistries   Recent Labs Lab 06/11/17 1757  NA 136  K 5.0  CL 111  CO2 19*  GLUCOSE 166*  BUN 58*  CREATININE 3.96*  CALCIUM 8.3*  AST 58*  ALT 70*  ALKPHOS 161*  BILITOT 0.4   ------------------------------------------------------------------------------------------------------------------  Cardiac Enzymes  Recent Labs Lab 06/12/17 0549 06/12/17 1334  TROPONINI 0.04* 0.03*   ------------------------------------------------------------------------------------------------------------------  RADIOLOGY:  Mr Brain Wo Contrast  Result Date: 06/11/2017 CLINICAL DATA:  76 y/o  M; TIA, initial exam.  Left-sided symptoms. EXAM: MRI HEAD WITHOUT CONTRAST TECHNIQUE: Multiplanar, multiecho pulse sequences of the brain and surrounding structures were obtained without intravenous contrast. COMPARISON:  06/11/2017 CT of the head. FINDINGS: Brain: Punctate focus of of diffusion hyperintensity within left high parietal lobe with uncertain reduced diffusion may represent a recent infarction. No additional evidence for acute/early subacute infarction of the brain. A nonspecific T2 FLAIR hyperintense signal abnormality predominantly in periventricular white matter is compatible with the mild chronic  microvascular ischemic changes for age and there is mild brain parenchymal  volume loss. Small chronic infarcts are present within the right mid corona radiata, right frontal centrum semiovale, and the right cerebellar hemisphere. The there are punctate foci of susceptibility hypointensity in the right parietal cortex, right lateral frontal subcortical white matter, and right posterolateral temporal lobe compatible hemosiderin deposition of chronic microhemorrhage. Vascular: Loss of the left internal carotid artery flow void upper cervical, petrous, and cavernous segments may represent slow flow or occlusion. Persistent flow voids are present within the central circle of Willis and large dural venous sinuses. Skull and upper cervical spine: Normal marrow signal. Sinuses/Orbits: Negative. Other: Negative. IMPRESSION: 1. Possible recent left high parietal punctate cortical infarction. No additional evidence for acute/early subacute infarction of the brain. 2. Loss of the left internal carotid artery upper cervical, petrous, and cavernous segment flow void may represent slow flow or occlusion. 3. Mild for age chronic microvascular ischemic changes and mild parenchymal volume loss of the brain. Small chronic lacunar infarcts in right corona radiata, right frontal centrum semiovale, and right cerebellar hemisphere. These results were called by telephone at the time of interpretation on 06/11/2017 at 8:09 pm to Dr. Eula Listen , who verbally acknowledged these results. Electronically Signed   By: Kristine Garbe M.D.   On: 06/11/2017 20:11   US Carotid Bilateral (at [facility] Only)  Result Date: 06/12/2017 CLINICAL DATA:  Possible small left parietal punctate infarct. Chronic left ICA known occlusion EXAM: BILATERAL CAROTID DUPLEX ULTRASOUND TECHNIQUE: Pearline Cables scale imaging, color Doppler and duplex ultrasound were performed of bilateral carotid and vertebral arteries in the neck. COMPARISON:   12/10/2012, 06/11/2017 FINDINGS: Criteria: Quantification of carotid stenosis is based on velocity parameters that correlate the residual internal carotid diameter with NASCET-based stenosis levels, using the diameter of the distal internal carotid lumen as the denominator for stenosis measurement. The following velocity measurements were obtained: RIGHT ICA:  145/23 cm/sec CCA:  950/93 cm/sec SYSTOLIC ICA/CCA RATIO:  1.1 DIASTOLIC ICA/CCA RATIO:  1.7 ECA:  432 cm/sec LEFT ICA:  Chronically occluded RIGHT CAROTID ARTERY: Heterogeneous partially calcified right carotid atherosclerosis extends into the proximal ICA. Proximal ICA luminal narrowing noted by grayscale imaging. In this region there is mild velocity elevation measuring 145/23 cm/sec with only slight turbulent flow. Degree of stenosis estimated at 50- 69% by ultrasound criteria (closer to 50%). RIGHT VERTEBRAL ARTERY:  Retrograde LEFT CAROTID ARTERY:  Chronic occlusion of the left ICA. 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 Retrograde right vertebral artery flow, compatible with subclavian steal phenomenon Antegrade left vertebral artery flow Electronically Signed   By: Jerilynn Mages.  Shick M.D.   On: 06/12/2017 14:20   Ct Head Code Stroke Wo Contrast`  Result Date: 06/11/2017 CLINICAL DATA:  Code stroke. 76 y/o M; left hand weakness, left facial droop, left facial numbness, slurred speech. Initial exam. EXAM: CT HEAD WITHOUT CONTRAST TECHNIQUE: Contiguous axial images were obtained from the base of the skull through the vertex without intravenous contrast. COMPARISON:  11/06/2016 CT of the head. FINDINGS: Brain: No acute vascular territory infarction, intracranial hemorrhage, or focal mass effect. Interval development of small lucencies in the right mid corona radiata and right frontal centrum semiovale probably  represent chronic small vessel infarctions and small stable chronic lacunar infarct in the right cerebellar hemisphere. Mild interval increase in brain parenchymal volume loss and chronic microvascular ischemic changes. Vascular: Calcific atherosclerosis of carotid siphons. No hyperdense  vessel identified. Skull: Normal. Negative for fracture or focal lesion. Sinuses/Orbits: No acute finding. Other: None. ASPECTS [facility] Stroke Program Early CT Score) - Ganglionic level infarction (caudate, lentiform nuclei, internal capsule, insula, M1-M3 cortex): 7 - Supraganglionic infarction (M4-M6 cortex): 3 Total score (0-10 with 10 being normal): 10 IMPRESSION: 1. No large acute infarct, intracranial hemorrhage, or focal mass effect. 2. ASPECTS is 10 3. Interval progression of chronic microvascular ischemic changes and parenchymal volume loss of the brain. These results were called by telephone at the time of interpretation on 06/11/2017 at 6:20 pm to Dr. Mariea Clonts, who verbally acknowledged these results. Electronically Signed   By: Kristine Garbe M.D.   On: 06/11/2017 18:21    ASSESSMENT AND PLAN:   Active Problems:   CVA (cerebral vascular accident) [facility])  This is a 76 y.o. male with a history of CAD s/p triple bypass, DM, HTN, HLD, hypothyroidism now being admitted with:  1. Left sided weakness /facial droop possible TIA -   recent left high parietal punctate cortical infarction, not consistent with current symptoms  - Done Echo, Carotids- shows Subclavian steal syndrome- Vascular consult. - Checked CBC, BMP, Lipids, TFTs, A1C -  Neurochecks, O2, dysphagia screen, permissive hypertension.  -  Neurology, PT/OT, S/S consults.  - Meds: Daily aspirin 325mg  per home regimen, add plavix as per neurologist. - Routine DVT Px: with Lovenox, SCDs, early ambulation - HBA1c high, counceleld for diet control. BP is now high, resume atenolol. - Appreciated neurology help- MRA brain.  2. Elevated troponin  in the setting of CKD, nonischemic EKG and no chest pain.  - Monitor on tele - Trend trops - Continue aspirin  3. AKI on CKD - gentle IVF hydration and recheck BMP in AM.   4. History of HLD - change to Atorvastatin due to stroke.  5. History of HTN - resume atenolol for now  6. History of DM - continue Humulin.  Accuchecks achs with RISS coverage  7. History of hypothyroidism - continue synthroid.  8. Subclavian steal syndrome- Vascular consult.   All the records are reviewed and case discussed with Care Management/Social Workerr. Management plans discussed with the patient, family and they are in agreement.  CODE STATUS: full.  TOTAL TIME TAKING CARE OF THIS PATIENT: 35 minutes.    POSSIBLE D/C IN 1-2 DAYS, DEPENDING ON CLINICAL CONDITION.   Vaughan Basta M.D on 06/12/2017   Between 7am to 6pm - Pager - [phone]  After 6pm go to [email or web address] - password [email or web address] [facility]  Office  [phone]  CC: Primary care physician; Glean Hess, MD  Note: This dictation was prepared with Dragon dictation along with smaller phrase technology. Any transcriptional errors that result from this process are unintentional.

## 2017-06-12 NOTE — Progress Notes (Signed)
*  PRELIMINARY RESULTS* Echocardiogram 2D Echocardiogram has been performed.  [name] 06/12/2017, 11:30 AM

## 2017-06-12 NOTE — Consult Note (Signed)
Referring Physician: Anselm Jungling    Chief Complaint: Slurred speech and left sided weakness  HPI: [name] is an 76 y.o. male who went to bed at baseline on the 28th.  Awakened on the 29th and noted slurred speech and left facial numbness/facial droop.  Patient then was unable to hold things in his left hand and dropped his keys.  Patient was unable to be convinced to come to the hospital until later that day when he finally presented for evaluation.  Initial NIHSS of 2.    Date last known well: Date: 06/10/2017 Time last known well: Time: 22:00 tPA Given: No: Outside time window  Past Medical History:  Diagnosis Date  . Cancer ([place])   . Coronary artery disease   . Diabetes mellitus without complication ([place])   . Hyperlipidemia   . Hypertension   . Thyroid disease     Past Surgical History:  Procedure Laterality Date  . APPENDECTOMY  2011  . CHOLECYSTECTOMY N/A 04/23/2016   Procedure: LAPAROSCOPIC CHOLECYSTECTOMY;  Surgeon: [name], MD;  Location: [facility] ORS;  Service: General;  Laterality: N/A;  . COLON SURGERY  2011   Colectomy for ileo-cecal valve cancer  . CORONARY ARTERY BYPASS GRAFT  1997   x 3  . HERNIA REPAIR  2011   Ventral hernia  . KNEE SURGERY Left   . PROSTATE SURGERY  2002   BPH benign pathology  . SPINE SURGERY  1989   Lumbar disc    Family History  Problem Relation Age of Onset  . Cancer Mother        Lung  . Diabetes Mother   . Heart disease Mother    Social History:  reports that he quit smoking about 20 years ago. His smoking use included Cigarettes. He has never used smokeless tobacco. He reports that he does not drink alcohol or use drugs.  Allergies:  Allergies  Allergen Reactions  . Ace Inhibitors Other (See Comments)    Reaction:  Raises potassium   . Quinapril Rash    hyperkalemia    Medications:  I have reviewed the patient's current medications. Prior to Admission:  Prescriptions Prior to Admission  Medication Sig Dispense  Refill Last Dose  . aspirin EC 325 MG tablet Take 325 mg by mouth daily.   06/11/2017 at 0730  . atenolol (TENORMIN) 25 MG tablet TAKE ONE TABLET BY MOUTH ONCE DAILY 30 tablet 5 06/11/2017 at 0730  . insulin NPH Human (HUMULIN N,NOVOLIN N) 100 UNIT/ML injection Inject 30 Units into the skin 2 (two) times daily.   06/11/2017 at 0730  . insulin regular (NOVOLIN R,HUMULIN R) 100 units/mL injection Inject 15-16 Units into the skin 2 (two) times daily. Pt takes depending on blood sugar.   06/11/2017 at 0730  . levothyroxine (SYNTHROID, LEVOTHROID) 300 MCG tablet Take 300 mcg by mouth daily before breakfast.   06/11/2017 at 0730  . simvastatin (ZOCOR) 20 MG tablet TAKE ONE TABLET BY MOUTH AT BEDTIME 30 tablet 5 06/11/2017 at 0730  . sodium bicarbonate 650 MG tablet Take 1 tablet by mouth 2 (two) times daily.   06/11/2017 at 0730  . Blood Glucose Monitoring Suppl (RELION PRIME MONITOR) DEVI    Taking  . glucose blood (RELION PRIME TEST) test strip    Taking  . mupirocin cream (BACTROBAN) 2 % Apply 1 application topically 2 (two) times daily. (Patient not taking: Reported on 06/11/2017) 30 g 1 Not Taking at Unknown time   Scheduled: . aspirin EC  325 mg Oral Daily  . atenolol  25 mg Oral Daily  . atorvastatin  40 mg Oral q1800  . clopidogrel  75 mg Oral Daily  . heparin subcutaneous  5,000 Units Subcutaneous Q8H  . insulin aspart  0-15 Units Subcutaneous TID WC  . insulin aspart  0-5 Units Subcutaneous QHS  . insulin detemir  30 Units Subcutaneous BID  . levothyroxine  300 mcg Oral QAC breakfast  . sodium bicarbonate  650 mg Oral BID    ROS: History obtained from the patient  General ROS: negative for - chills, fatigue, fever, night sweats, weight gain or weight loss Psychological ROS: negative for - behavioral disorder, hallucinations, memory difficulties, mood swings or suicidal ideation Ophthalmic ROS: negative for - blurry vision, double vision, eye pain or loss of vision ENT ROS: negative for -  epistaxis, nasal discharge, oral lesions, sore throat, tinnitus or vertigo Allergy and Immunology ROS: negative for - hives or itchy/watery eyes Hematological and Lymphatic ROS: negative for - bleeding problems, bruising or swollen lymph nodes Endocrine ROS: negative for - galactorrhea, hair pattern changes, polydipsia/polyuria or temperature intolerance Respiratory ROS: negative for - cough, hemoptysis, shortness of breath or wheezing Cardiovascular ROS: negative for - chest pain, dyspnea on exertion, edema or irregular heartbeat Gastrointestinal ROS: negative for - abdominal pain, diarrhea, hematemesis, nausea/vomiting or stool incontinence Genito-Urinary ROS: negative for - dysuria, hematuria, incontinence or urinary frequency/urgency Musculoskeletal ROS: negative for - joint swelling or muscular weakness Neurological ROS: as noted in HPI Dermatological ROS: negative for rash and skin lesion changes  Physical Examination: Blood pressure (!) 145/90, pulse (!) 57, temperature (!) 97.5 F (36.4 C), temperature source Oral, resp. rate 18, height 6\' 2"  (1.88 m), weight 103.3 kg (227 lb 12.8 oz), SpO2 97 %.  HEENT-  Normocephalic, no lesions, without obvious abnormality.  Normal external eye and conjunctiva.  Normal TM's bilaterally.  Normal auditory canals and external ears. Normal external nose, mucus membranes and septum.  Normal pharynx. Cardiovascular- S1, S2 normal, pulses palpable throughout   Lungs- chest clear, no wheezing, rales, normal symmetric air entry Abdomen- soft, non-tender; bowel sounds normal; no masses,  no organomegaly Extremities- no edema Lymph-no adenopathy palpable Musculoskeletal-no joint tenderness, deformity or swelling Skin-warm and dry, no hyperpigmentation, vitiligo, or suspicious lesions  Neurological Examination   Mental Status: Alert, oriented, thought content appropriate.  Speech fluent without evidence of aphasia.  Able to follow 3 step commands without  difficulty. Cranial Nerves: II: Discs flat bilaterally; Visual fields grossly normal, pupils equal, round, reactive to light and accommodation III,IV, VI: ptosis not present, extra-ocular motions intact bilaterally V,VII: smile symmetric, facial light touch sensation normal bilaterally VIII: hearing normal bilaterally IX,X: gag reflex present XI: bilateral shoulder shrug XII: midline tongue extension Motor: Right : Upper extremity   5/5    Left:     Upper extremity   5/5 with mild UE drift  Lower extremity   5/5     Lower extremity   5/5 Tone and bulk:normal tone throughout; no atrophy noted Sensory: Pinprick and light touch intact throughout, bilaterally Deep Tendon Reflexes: 2+ and symmetric with absent AJ's bilaterally Plantars: Right: downgoing   Left: downgoing Cerebellar: Normal finger-to-nose and normal heel-to-shin testing bilaterally Gait: not tested due to safety concerns    Laboratory Studies:  Basic Metabolic Panel:  Recent Labs Lab 06/11/17 1757  NA 136  K 5.0  CL 111  CO2 19*  GLUCOSE 166*  BUN 58*  CREATININE 3.96*  CALCIUM 8.3*  Liver Function Tests:  Recent Labs Lab 06/11/17 1757  AST 58*  ALT 70*  ALKPHOS 161*  BILITOT 0.4  PROT 6.6  ALBUMIN 2.8*   No results for input(s): LIPASE, AMYLASE in the last 168 hours. No results for input(s): AMMONIA in the last 168 hours.  CBC:  Recent Labs Lab 06/11/17 1757  WBC 8.5  NEUTROABS 4.4  HGB 13.6  HCT 40.4  MCV 91.5  PLT 197    Cardiac Enzymes:  Recent Labs Lab 06/11/17 1757 06/11/17 2359 06/12/17 0549 06/12/17 1334  TROPONINI 0.03* 0.04* 0.04* 0.03*    BNP: Invalid input(s): POCBNP  CBG:  Recent Labs Lab 06/11/17 1817 06/11/17 2232 06/12/17 0738 06/12/17 1128  GLUCAP 154* 131* 155* 186*    Microbiology: Results for orders placed or performed during the hospital encounter of 04/20/16  Surgical PCR screen     Status: None   Collection Time: 04/23/16 12:51 AM   Result Value Ref Range Status   MRSA, PCR NEGATIVE NEGATIVE Final   Staphylococcus aureus NEGATIVE NEGATIVE Final    Comment:        The Xpert SA Assay (FDA approved for NASAL specimens in patients over 70 years of age), is one component of a comprehensive surveillance program.  Test performance has been validated by [facility] for patients greater than or equal to 71 year old. It is not intended to diagnose infection nor to guide or monitor treatment.     Coagulation Studies:  Recent Labs  06/11/17 1757  LABPROT 13.8  INR 1.07    Urinalysis:  Recent Labs Lab 06/12/17 0218  COLORURINE YELLOW*  LABSPEC 1.013  PHURINE 5.0  GLUCOSEU 150*  HGBUR SMALL*  BILIRUBINUR NEGATIVE  KETONESUR NEGATIVE  PROTEINUR 100*  NITRITE NEGATIVE  LEUKOCYTESUR NEGATIVE    Lipid Panel:    Component Value Date/Time   CHOL 83 06/12/2017 0549   CHOL 125 07/22/2016 1038   TRIG 206 (H) 06/12/2017 0549   HDL 13 (L) 06/12/2017 0549   HDL 21 (L) 07/22/2016 1038   CHOLHDL 6.4 06/12/2017 0549   VLDL 41 (H) 06/12/2017 0549   LDLCALC 29 06/12/2017 0549   LDLCALC 56 07/22/2016 1038    HgbA1C:  Lab Results  Component Value Date   HGBA1C 8.5 (H) 06/12/2017    Urine Drug Screen:     Component Value Date/Time   LABOPIA NONE DETECTED 06/12/2017 0218   COCAINSCRNUR NONE DETECTED 06/12/2017 0218   LABBENZ NONE DETECTED 06/12/2017 0218   AMPHETMU NONE DETECTED 06/12/2017 0218   THCU NONE DETECTED 06/12/2017 0218   LABBARB NONE DETECTED 06/12/2017 0218    Alcohol Level:  Recent Labs Lab 06/11/17 1754  ETH <5    Other results: EKG: normal sinus rhythm at 71 bpm.  Imaging: Mr Brain Wo Contrast  Result Date: 06/11/2017 CLINICAL DATA:  76 y/o  M; TIA, initial exam.  Left-sided symptoms. EXAM: MRI HEAD WITHOUT CONTRAST TECHNIQUE: Multiplanar, multiecho pulse sequences of the brain and surrounding structures were obtained without intravenous contrast. COMPARISON:  06/11/2017 CT of  the head. FINDINGS: Brain: Punctate focus of of diffusion hyperintensity within left high parietal lobe with uncertain reduced diffusion may represent a recent infarction. No additional evidence for acute/early subacute infarction of the brain. A nonspecific T2 FLAIR hyperintense signal abnormality predominantly in periventricular white matter is compatible with the mild chronic microvascular ischemic changes for age and there is mild brain parenchymal volume loss. Small chronic infarcts are present within the right mid corona radiata, right frontal  centrum semiovale, and the right cerebellar hemisphere. The there are punctate foci of susceptibility hypointensity in the right parietal cortex, right lateral frontal subcortical white matter, and right posterolateral temporal lobe compatible hemosiderin deposition of chronic microhemorrhage. Vascular: Loss of the left internal carotid artery flow void upper cervical, petrous, and cavernous segments may represent slow flow or occlusion. Persistent flow voids are present within the central circle of Willis and large dural venous sinuses. Skull and upper cervical spine: Normal marrow signal. Sinuses/Orbits: Negative. Other: Negative. IMPRESSION: 1. Possible recent left high parietal punctate cortical infarction. No additional evidence for acute/early subacute infarction of the brain. 2. Loss of the left internal carotid artery upper cervical, petrous, and cavernous segment flow void may represent slow flow or occlusion. 3. Mild for age chronic microvascular ischemic changes and mild parenchymal volume loss of the brain. Small chronic lacunar infarcts in right corona radiata, right frontal centrum semiovale, and right cerebellar hemisphere. These results were called by telephone at the time of interpretation on 06/11/2017 at 8:09 pm to Dr. Eula Listen , who verbally acknowledged these results. Electronically Signed   By: Kristine Garbe M.D.   On:  06/11/2017 20:11   Ct Head Code Stroke Wo Contrast`  Result Date: 06/11/2017 CLINICAL DATA:  Code stroke. 76 y/o M; left hand weakness, left facial droop, left facial numbness, slurred speech. Initial exam. EXAM: CT HEAD WITHOUT CONTRAST TECHNIQUE: Contiguous axial images were obtained from the base of the skull through the vertex without intravenous contrast. COMPARISON:  11/06/2016 CT of the head. FINDINGS: Brain: No acute vascular territory infarction, intracranial hemorrhage, or focal mass effect. Interval development of small lucencies in the right mid corona radiata and right frontal centrum semiovale probably represent chronic small vessel infarctions and small stable chronic lacunar infarct in the right cerebellar hemisphere. Mild interval increase in brain parenchymal volume loss and chronic microvascular ischemic changes. Vascular: Calcific atherosclerosis of carotid siphons. No hyperdense vessel identified. Skull: Normal. Negative for fracture or focal lesion. Sinuses/Orbits: No acute finding. Other: None. ASPECTS [facility] Stroke Program Early CT Score) - Ganglionic level infarction (caudate, lentiform nuclei, internal capsule, insula, M1-M3 cortex): 7 - Supraganglionic infarction (M4-M6 cortex): 3 Total score (0-10 with 10 being normal): 10 IMPRESSION: 1. No large acute infarct, intracranial hemorrhage, or focal mass effect. 2. ASPECTS is 10 3. Interval progression of chronic microvascular ischemic changes and parenchymal volume loss of the brain. These results were called by telephone at the time of interpretation on 06/11/2017 at 6:20 pm to Dr. Mariea Clonts, who verbally acknowledged these results. Electronically Signed   By: Kristine Garbe M.D.   On: 06/11/2017 18:21    Assessment: 76 y.o. male presenting with left sided symptoms that have improved with only minor findings at this time.  MRI of the brain reviewed and shows an area suggestive of a small acute infarct in the left high parietal  area.  Concern is for an embolic etiology.   Area not consistent with current presentation which was likely a right hemispheric TIA.  Some concern for left ICA stenosis as well.  Further work up recommended.  Patient with renal function prohibitive of CTA.  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   A1c 8.5, LDL 29.    Stroke Risk Factors - diabetes mellitus, hyperlipidemia and hypertension  Plan: 1. PT consult, OT consult, Speech consult 2. Echocardiogram pending 3. Vascular evaluation 4. Prophylactic therapy-ASA 81mg , Plavix 75mg  daily 5.  NPO until RN stroke swallow screen 6. Telemetry monitoring 7. Frequent neuro checks 8. Blood sugar management  Case discussed with Dr. Judeth Horn, MD Neurology [phone] 06/12/2017, 2:15 PM

## 2017-06-12 NOTE — Evaluation (Signed)
Occupational Therapy Evaluation Patient Details Name: [name] MRN: [id number] DOB: [birth year] Today's Date: 06/12/2017    History of Present Illness 76 y.o. male pt with a known history of CAD s/p triple bypass, DM, HTN, HLD, hypothyroidism, and cancer was in a usual state of health until 8 am 8/29 when he developed left hand and arm weakness associated with tingling of the left face, left facial droop and slurred speech.  He states he could not hold on to his keys. Presented to ED, at which time he feels his weakness is better but his face is still numb. CT negative. MRI indicates possible recent L high parietal punctate cortical infarct, but symptoms are not consistent with this.    Clinical Impression   Pt seen for OT evaluation this date. Pt was independent at baseline with 3 steps and no rails to enter home where he lives with his spouse. Adult children live nearby and offer a good support system. Pt presents with no visual, sensory, or strength/ROM deficits at this time. All numbness/tingling has resolved, strength/ROM WFL bilaterally. Pt independent with all ADL tasks during assessment and initial min guard during ambulation with no AD decreasing to supervision. Pt demonstrates good safety awareness and confidence, no LOB noted throughout session. VSS throughout. Pt eager to return home. Pt/spouse endorsed slight difficulty with toilet transfers at home, given pt's height and spouse's knee pain. Both educated in benefits of a toilet riser and grab bars to maximize safety and functional independence with toilet transfers. Pt/spouse verbalized understanding and plan to purchase. No additional skilled OT needs identified. Will sign off. Please re-consult if additional needs arise.     Follow Up Recommendations  No OT follow up;Supervision - Intermittent    Equipment Recommendations  Toilet riser;Other (comment) (handheld shower head, grab bar near toilet and in shower)    Recommendations  for Other Services       Precautions / Restrictions Precautions Precautions: Fall Restrictions Weight Bearing Restrictions: No      Mobility Bed Mobility Overal bed mobility: Modified Independent             General bed mobility comments: good safety awareness, no LOB, pt slightly cautious with movement due to IV/pole mgt with therapist  Transfers Overall transfer level: Independent Equipment used: None             General transfer comment: good safety awareness, no LOB, no increased effort, slightly cautious with movement due to IV/pole mgt with therapist    Balance Overall balance assessment: Modified Independent;No apparent balance deficits (not formally assessed)                                         ADL either performed or assessed with clinical judgement   ADL Overall ADL's : Independent                                       General ADL Comments: Pt independent with all dressing, toileting, bed mobility, self feeding, and grooming tasks during assessment. No LOB, no dizziness/pain reported. Pt with good safety awareness. Spouse will be there 24/7. Pt reports all symptoms have resolved.      Vision Baseline Vision/History: Wears glasses Wears Glasses: Reading only Patient Visual Report: No change from baseline Vision Assessment?: No apparent visual  deficits Additional Comments: no deficits with visual testing     Perception     Praxis      Pertinent Vitals/Pain Pain Assessment: No/denies pain     Hand Dominance Left   Extremity/Trunk Assessment Upper Extremity Assessment Upper Extremity Assessment: Overall WFL for tasks assessed (intact sensation, normal finger to nose, thumb opposition, RAM testing bilaterally; gets cortisone shots in L shoulder; strength/ROM WFL bilaterally)   Lower Extremity Assessment Lower Extremity Assessment: Overall WFL for tasks assessed;Defer to PT evaluation (intact sensation,  ROM/strength WFL grossly)   Cervical / Trunk Assessment Cervical / Trunk Assessment: Normal   Communication Communication Communication: No difficulties   Cognition Arousal/Alertness: Awake/alert Behavior During Therapy: WFL for tasks assessed/performed Overall Cognitive Status: Within Functional Limits for tasks assessed                                     General Comments       Exercises Other Exercises Other Exercises: pt/spouse educated in benefits of a toilet riser to support more comfortable/safe toilet transfers, after both pt/spouse report slight difficulty/discomfort with transfers from regular height toilet, particularly for pt who is fairly tall and spouse who has knee pain. Pt/spouse verbalized understanding and verbalized plan to have adult children look into getting one for them. Also educated in benefits of handheld shower head.   Shoulder Instructions      Home Living Family/patient expects to be discharged to:: Private residence Living Arrangements: Spouse/significant other Available Help at Discharge: Family (adult children live next door) Type of Home: Mobile home ("trailor") Home Access: Stairs to enter Technical brewer of Steps: 3 Entrance Stairs-Rails: None Home Layout: One level     Bathroom Shower/Tub: Occupational psychologist: Standard     Home Equipment: Environmental consultant - 2 wheels;Cane - single point;Shower seat   Additional Comments: has DME/AE from previous surgeries, does not need/use.      Prior Functioning/Environment Level of Independence: Independent        Comments: Pt independent with mobility, ADL, driving, and enjoys yardwork, watching tv, spending time with family, and also works as Marine scientist. No falls reported in past 12 months.         OT Problem List:        OT Treatment/Interventions:      OT Goals(Current goals can be found in the care plan section) Acute Rehab OT Goals OT Goal  Formulation: All assessment and education complete, DC therapy  OT Frequency:     Barriers to D/C:            Co-evaluation              AM-PAC PT "6 Clicks" Daily Activity     Outcome Measure Help from another person eating meals?: None Help from another person taking care of personal grooming?: None Help from another person toileting, which includes using toliet, bedpan, or urinal?: None Help from another person bathing (including washing, rinsing, drying)?: A Little Help from another person to put on and taking off regular upper body clothing?: None Help from another person to put on and taking off regular lower body clothing?: None 6 Click Score: 23   End of Session Equipment Utilized During Treatment: Gait belt  Activity Tolerance: Patient tolerated treatment well Patient left: in bed;with call bell/phone within reach;with bed alarm set;with family/visitor present  OT Visit Diagnosis: Other abnormalities of gait and  mobility (R26.89)                Time: 7741-4239 OT Time Calculation (min): 26 min Charges:  OT General Charges $OT Visit: 1 Visit OT Evaluation $OT Eval Low Complexity: 1 Low OT Treatments $Self Care/Home Management : 8-22 mins G-Codes: OT G-codes **NOT FOR INPATIENT CLASS** Functional Assessment Tool Used: AM-PAC 6 Clicks Daily Activity;Clinical judgement Functional Limitation: Self care Self Care Current Status (R3202): At least 1 percent but less than 20 percent impaired, limited or restricted Self Care Goal Status (B3435): At least 1 percent but less than 20 percent impaired, limited or restricted Self Care Discharge Status [phone]): At least 1 percent but less than 20 percent impaired, limited or restricted   Jeni Salles, MPH, MS, OTR/L ascom [phone] 06/12/17, 10:14 AM

## 2017-06-12 NOTE — Care Management (Signed)
Met with patient and his wife to discuss discharge planning.  PT evaluation pending. He has a walker available at home but states that at baseline he does not need it. He is independent with mobility and able to drive at baseline. [facility] admission and agrees to them again if needed. RNCM will continue to follow.  

## 2017-06-12 NOTE — Plan of Care (Signed)
Problem: Education: Goal: Knowledge of disease or condition will improve Outcome: Progressing Pt likes to be called [name]  PAST MEDICAL HISTORY:     Past Medical History:  Diagnosis Date  . Cancer ([place])   . Coronary artery disease   . Diabetes mellitus without complication ([place])   . Hyperlipidemia   . Hypertension   . Thyroid disease    Pt is well controlled with home medications

## 2017-06-12 NOTE — Progress Notes (Signed)
Patient had a B/P of 181/81. MD notified

## 2017-06-12 NOTE — Progress Notes (Signed)
SLP Cancellation Note  Patient Details Name: [name] MRN: [id number] DOB: 12-Jul-1941   Cancelled treatment:       Reason Eval/Treat Not Completed: SLP screened, no needs identified, will sign off (Chart reviewed; NSG consulted; Met with pt/spouse)  Pt denied any difficulty swallowing and is currently on a regular diet; tolerates swallowing pills w/ water per NSG. Pt conversed at conversational level w/out deficits noted; pt and spouse denied any speech-language deficits.  No further skilled ST services indicated as pt appears at his baseline. Pt agreed. NSG to reconsult if any change in status.   Carolynn Sayers, SLP-Graduate Student Carolynn Sayers 06/12/2017, 10:42 AM   This information has been reviewed and agreed upon by this supervising clinician.  This patient note, response to treatment and overall treatment plan has been reviewed and this clinician agrees with the information provided.   06/12/17, 11:29 AM [phone] Orinda Kenner, MS, CCC-SLP

## 2017-06-12 NOTE — Consult Note (Signed)
[facility] Consult Note  MRN : [id number]  [name] is a 76 y.o. ([birth year]) male who presents with chief complaint of  Chief Complaint  Patient presents with  . Transient Ischemic Attack  .  History of Present Illness: I am asked by Dr. Anselm Jungling to evaluate the patient due to recent stroke and carotid and subclavian disease. He was recently admitted with left arm weakness and left sided facial numbness yesterday.  He feels much better today than he did yesterday. He reports that his numbness and weakness have essentially resolved at this point. He reports no significant residual issues. He denies visual symptoms. He had speech deficits on admission, but he seems to be speaking regularly now. He denies any fever or chills. He has baseline significant chronic kidney disease which has precluded CT angiogram, but his had multiple other studies that I have reviewed. MRI suggests acute left hemispheric stroke. An MRA shows flow gap the petrous portions of the left carotid artery. A carotid duplex shows a known occlusion of the left internal carotid artery, velocities consistent with stenosis in the 50-69% range in the right internal carotid artery, and antegrade left vertebral artery, and a retrograde right vertebral artery consistent with right subclavian artery stenosis or occlusion. For these reasons, we're consult for further evaluation and treatment.  Current Facility-Administered Medications  Medication Dose Route Frequency Provider Last Rate Last Dose  . acetaminophen (TYLENOL) tablet 650 mg  650 mg Oral Q4H PRN Hugelmeyer, Alexis, DO       Or  . acetaminophen (TYLENOL) solution 650 mg  650 mg Per Tube Q4H PRN Hugelmeyer, Alexis, DO       Or  . acetaminophen (TYLENOL) suppository 650 mg  650 mg Rectal Q4H PRN Hugelmeyer, Alexis, DO      . aspirin EC tablet 325 mg  325 mg Oral Daily Hugelmeyer, Alexis, DO   325 mg at 06/12/17 0909  . atenolol (TENORMIN)  tablet 25 mg  25 mg Oral Daily Vaughan Basta, MD   25 mg at 06/12/17 1302  . atorvastatin (LIPITOR) tablet 40 mg  40 mg Oral q1800 Vaughan Basta, MD      . clopidogrel (PLAVIX) tablet 75 mg  75 mg Oral Daily Vaughan Basta, MD      . heparin injection 5,000 Units  5,000 Units Subcutaneous Q8H Vaughan Basta, MD      . insulin aspart (novoLOG) injection 0-15 Units  0-15 Units Subcutaneous TID WC Hugelmeyer, Alexis, DO   3 Units at 06/12/17 1302  . insulin aspart (novoLOG) injection 0-5 Units  0-5 Units Subcutaneous QHS Hugelmeyer, Alexis, DO      . insulin detemir (LEVEMIR) injection 30 Units  30 Units Subcutaneous BID Hugelmeyer, Alexis, DO   30 Units at 06/12/17 0909  . levothyroxine (SYNTHROID, LEVOTHROID) tablet 300 mcg  300 mcg Oral QAC breakfast Hugelmeyer, Alexis, DO   300 mcg at 06/12/17 0810  . senna-docusate (Senokot-S) tablet 1 tablet  1 tablet Oral QHS PRN Hugelmeyer, Alexis, DO      . sodium bicarbonate tablet 650 mg  650 mg Oral BID Hugelmeyer, Alexis, DO   650 mg at 06/12/17 0909    Past Medical History:  Diagnosis Date  . Cancer ([place])   . Coronary artery disease   . Diabetes mellitus without complication ([place])   . Hyperlipidemia   . Hypertension   . Thyroid disease     Past Surgical History:  Procedure Laterality Date  . APPENDECTOMY  2011  . CHOLECYSTECTOMY N/A 04/23/2016   Procedure: LAPAROSCOPIC CHOLECYSTECTOMY;  Surgeon: [name], MD;  Location: [facility] ORS;  Service: General;  Laterality: N/A;  . COLON SURGERY  2011   Colectomy for ileo-cecal valve cancer  . CORONARY ARTERY BYPASS GRAFT  1997   x 3  . HERNIA REPAIR  2011   Ventral hernia  . KNEE SURGERY Left   . PROSTATE SURGERY  2002   BPH benign pathology  . SPINE SURGERY  1989   Lumbar disc    Social History Social History  Substance Use Topics  . Smoking status: Former Smoker    Types: Cigarettes    Quit date: 06/24/1996  . Smokeless tobacco: Never Used  .  Alcohol use No  married  Family History Family History  Problem Relation Age of Onset  . Cancer Mother        Lung  . Diabetes Mother   . Heart disease Mother   No bleeding or clotting disorders, no aneurysms  Allergies  Allergen Reactions  . Ace Inhibitors Other (See Comments)    Reaction:  Raises potassium   . Quinapril Rash    hyperkalemia     REVIEW OF SYSTEMS (Negative unless checked)  Constitutional: [] Weight loss  [] Fever  [] Chills Cardiac: [] Chest pain   [] Chest pressure   [] Palpitations   [] Shortness of breath when laying flat   [] Shortness of breath at rest   [x] Shortness of breath with exertion. Vascular:  [] Pain in legs with walking   [] Pain in legs at rest   [] Pain in legs when laying flat   [] Claudication   [] Pain in feet when walking  [] Pain in feet at rest  [] Pain in feet when laying flat   [] History of DVT   [] Phlebitis   [] Swelling in legs   [] Varicose veins   [] Non-healing ulcers Pulmonary:   [] Uses home oxygen   [] Productive cough   [] Hemoptysis   [] Wheeze  [] COPD   [] Asthma Neurologic:  [] Dizziness  [] Blackouts   [] Seizures   [x] History of stroke   [x] History of TIA  [] Aphasia   [] Temporary blindness   [] Dysphagia   [x] Weakness or numbness in arms   [] Weakness or numbness in legs Musculoskeletal:  [x] Arthritis   [] Joint swelling   [] Joint pain   [] Low back pain Hematologic:  [] Easy bruising  [] Easy bleeding   [] Hypercoagulable state   [] Anemic  [] Hepatitis Gastrointestinal:  [] Blood in stool   [] Vomiting blood  [] Gastroesophageal reflux/heartburn   [] Difficulty swallowing. Genitourinary:  [x] Chronic kidney disease   [] Difficult urination  [] Frequent urination  [] Burning with urination   [] Blood in urine Skin:  [] Rashes   [] Ulcers   [] Wounds Psychological:  [] History of anxiety   []  History of major depression.  Physical Examination  Vitals:   06/12/17 0451 06/12/17 0636 06/12/17 0812 06/12/17 1334  BP: (!) 151/66 (!) 168/74 (!) 186/85 (!) 145/90  Pulse: 63  (!) 55 60 (!) 57  Resp: 16 14 18    Temp: 97.7 F (36.5 C) 97.6 F (36.4 C) (!) 97.5 F (36.4 C) (!) 97.5 F (36.4 C)  TempSrc: Oral Oral Oral Oral  SpO2: 98% 94% 100% 97%  Weight:      Height:       Body mass index is 29.25 kg/m. Gen:  WD/WN, NAD Head: /AT, No temporalis wasting.  Ear/Nose/Throat: Hearing grossly intact, nares w/o erythema or drainage, oropharynx w/o Erythema/Exudate Eyes: Sclera non-icteric, conjunctiva clear Neck: Trachea midline.  No JVD. Bilateral carotid bruits Pulmonary:  Good  air movement, respirations not labored, equal bilaterally.  Cardiac: RRR, normal S1, S2. Vascular:  Vessel Right Left  Radial 1+ Palpable Palpable                                    Musculoskeletal: M/S 5/5 throughout.  Extremities without ischemic changes.  No deformity or atrophy.  Neurologic: Sensation grossly intact in extremities.  Symmetrical.  Speech is fluent. Motor exam as listed above. Psychiatric: Judgment intact, Mood & affect appropriate for pt's clinical situation. Dermatologic: No rashes or ulcers noted.  No cellulitis or open wounds. Lymph : No Cervical, Axillary, or Inguinal lymphadenopathy.      CBC Lab Results  Component Value Date   WBC 8.5 06/11/2017   HGB 13.6 06/11/2017   HCT 40.4 06/11/2017   MCV 91.5 06/11/2017   PLT 197 06/11/2017    BMET    Component Value Date/Time   NA 136 06/11/2017 1757   NA 132 (L) 12/24/2016 1541   NA 140 12/10/2012 0726   K 5.0 06/11/2017 1757   K 4.6 12/10/2012 0726   CL 111 06/11/2017 1757   CL 111 (H) 12/10/2012 0726   CO2 19 (L) 06/11/2017 1757   CO2 20 (L) 12/10/2012 0726   GLUCOSE 166 (H) 06/11/2017 1757   GLUCOSE 181 (H) 12/10/2012 0726   BUN 58 (H) 06/11/2017 1757   BUN 50 (H) 12/24/2016 1541   BUN 33 (H) 09/01/2013 0951   CREATININE 3.96 (H) 06/11/2017 1757   CREATININE 2.27 (H) 09/01/2013 0951   CALCIUM 8.3 (L) 06/11/2017 1757   CALCIUM 8.1 (L) 12/10/2012 0726   GFRNONAA 13 (L)  06/11/2017 1757   GFRNONAA 28 (L) 09/01/2013 0951   GFRAA 15 (L) 06/11/2017 1757   GFRAA 32 (L) 09/01/2013 0951   Estimated Creatinine Clearance: 20 mL/min (A) (by C-G formula based on SCr of 3.96 mg/dL (H)).  COAG Lab Results  Component Value Date   INR 1.07 06/11/2017   INR 1.0 09/01/2013    Radiology Mr Jodene Nam Head Wo Contrast  Result Date: 06/12/2017 CLINICAL DATA:  Initial evaluation for left-sided upper extremity weakness and numbness with speech difficulty. EXAM: MRA HEAD WITHOUT CONTRAST TECHNIQUE: Angiographic images of the Circle of Willis were obtained using MRA technique without intravenous contrast. COMPARISON:  Prior CT and MRI from 06/11/2017. FINDINGS: ANTERIOR CIRCULATION: Chronic occlusion of the left ICA to the level of the terminus, similar to previous exams. Distal cervical right ICA widely patent with antegrade flow. Petrous, cavernous, and supraclinoid right ICA widely patent without flow-limiting stenosis. Right ICA terminus widely patent. Right A1 segment widely patent. Widely patent anterior communicating artery. Anterior cerebral arteries widely patent to their distal aspects. Minimal filling of the left ICA terminus 7 and on 3D time-of-flight sequence (series 2, image 97). Patchy and attenuated flow within the distal left MCA branches, likely via collateral flow cross circle of Willis. POSTERIOR CIRCULATION: Vertebral arteries patent to the vertebrobasilar junction without flow-limiting stenosis. Right V4 segment somewhat attenuated as compared to the left on this exam. Posterior inferior cerebral arteries patent proximally. Basilar artery patent without flow-limiting stenosis. Superior cerebral arteries patent bilaterally. Left PCA primarily supplied via the basilar, although a tiny left posterior communicating artery noted. Left PCA widely patent to its distal aspect. Predominant fetal type origin of the right PCA supplied via at a right posterior communicating artery.  Right PCA somewhat attenuated as compared to the left,  which may be technical in origin due to fetal type origin. No appreciable flow-limiting stenosis. No aneurysm or vascular malformation. IMPRESSION: 1. Chronic left ICA occlusion to the level of the terminus. Minimal patchy and attenuated flow within the left MCA distribution via collateral flow across the circle of Willis and/ or from the posterior circulation. 2. Otherwise widely patent anterior circulation without flow-limiting stenosis. 3. Patent vertebrobasilar system without flow-limiting stenosis. Right PCA somewhat attenuated as compared to the left on this exam, suspected to be technical in nature due to fetal type origin. Electronically Signed   By: Jeannine Boga M.D.   On: 06/12/2017 16:08   Mr Brain Wo Contrast  Result Date: 06/11/2017 CLINICAL DATA:  76 y/o  M; TIA, initial exam.  Left-sided symptoms. EXAM: MRI HEAD WITHOUT CONTRAST TECHNIQUE: Multiplanar, multiecho pulse sequences of the brain and surrounding structures were obtained without intravenous contrast. COMPARISON:  06/11/2017 CT of the head. FINDINGS: Brain: Punctate focus of of diffusion hyperintensity within left high parietal lobe with uncertain reduced diffusion may represent a recent infarction. No additional evidence for acute/early subacute infarction of the brain. A nonspecific T2 FLAIR hyperintense signal abnormality predominantly in periventricular white matter is compatible with the mild chronic microvascular ischemic changes for age and there is mild brain parenchymal volume loss. Small chronic infarcts are present within the right mid corona radiata, right frontal centrum semiovale, and the right cerebellar hemisphere. The there are punctate foci of susceptibility hypointensity in the right parietal cortex, right lateral frontal subcortical white matter, and right posterolateral temporal lobe compatible hemosiderin deposition of chronic microhemorrhage. Vascular:  Loss of the left internal carotid artery flow void upper cervical, petrous, and cavernous segments may represent slow flow or occlusion. Persistent flow voids are present within the central circle of Willis and large dural venous sinuses. Skull and upper cervical spine: Normal marrow signal. Sinuses/Orbits: Negative. Other: Negative. IMPRESSION: 1. Possible recent left high parietal punctate cortical infarction. No additional evidence for acute/early subacute infarction of the brain. 2. Loss of the left internal carotid artery upper cervical, petrous, and cavernous segment flow void may represent slow flow or occlusion. 3. Mild for age chronic microvascular ischemic changes and mild parenchymal volume loss of the brain. Small chronic lacunar infarcts in right corona radiata, right frontal centrum semiovale, and right cerebellar hemisphere. These results were called by telephone at the time of interpretation on 06/11/2017 at 8:09 pm to Dr. Eula Listen , who verbally acknowledged these results. Electronically Signed   By: Kristine Garbe M.D.   On: 06/11/2017 20:11   US Carotid Bilateral (at [facility] Only)  Result Date: 06/12/2017 CLINICAL DATA:  Possible small left parietal punctate infarct. Chronic left ICA known occlusion EXAM: BILATERAL CAROTID DUPLEX ULTRASOUND TECHNIQUE: Pearline Cables scale imaging, color Doppler and duplex ultrasound were performed of bilateral carotid and vertebral arteries in the neck. COMPARISON:  12/10/2012, 06/11/2017 FINDINGS: Criteria: Quantification of carotid stenosis is based on velocity parameters that correlate the residual internal carotid diameter with NASCET-based stenosis levels, using the diameter of the distal internal carotid lumen as the denominator for stenosis measurement. The following velocity measurements were obtained: RIGHT ICA:  145/23 cm/sec CCA:  254/27 cm/sec SYSTOLIC ICA/CCA RATIO:  1.1 DIASTOLIC ICA/CCA RATIO:  1.7 ECA:  432 cm/sec LEFT ICA:   Chronically occluded RIGHT CAROTID ARTERY: Heterogeneous partially calcified right carotid atherosclerosis extends into the proximal ICA. Proximal ICA luminal narrowing noted by grayscale imaging. In this region there is mild velocity elevation measuring 145/23 cm/sec  with only slight turbulent flow. Degree of stenosis estimated at 50- 69% by ultrasound criteria (closer to 50%). RIGHT VERTEBRAL ARTERY:  Retrograde LEFT CAROTID ARTERY:  Chronic occlusion of the left ICA. 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 Retrograde right vertebral artery flow, compatible with subclavian steal phenomenon Antegrade left vertebral artery flow Electronically Signed   By: Jerilynn Mages.  Shick M.D.   On: 06/12/2017 14:20   Ct Head Code Stroke Wo Contrast`  Result Date: 06/11/2017 CLINICAL DATA:  Code stroke. 76 y/o M; left hand weakness, left facial droop, left facial numbness, slurred speech. Initial exam. EXAM: CT HEAD WITHOUT CONTRAST TECHNIQUE: Contiguous axial images were obtained from the base of the skull through the vertex without intravenous contrast. COMPARISON:  11/06/2016 CT of the head. FINDINGS: Brain: No acute vascular territory infarction, intracranial hemorrhage, or focal mass effect. Interval development of small lucencies in the right mid corona radiata and right frontal centrum semiovale probably represent chronic small vessel infarctions and small stable chronic lacunar infarct in the right cerebellar hemisphere. Mild interval increase in brain parenchymal volume loss and chronic microvascular ischemic changes. Vascular: Calcific atherosclerosis of carotid siphons. No hyperdense vessel identified. Skull: Normal. Negative for fracture or focal lesion. Sinuses/Orbits: No acute finding. Other: None. ASPECTS [facility] Stroke Program Early CT Score) - Ganglionic level  infarction (caudate, lentiform nuclei, internal capsule, insula, M1-M3 cortex): 7 - Supraganglionic infarction (M4-M6 cortex): 3 Total score (0-10 with 10 being normal): 10 IMPRESSION: 1. No large acute infarct, intracranial hemorrhage, or focal mass effect. 2. ASPECTS is 10 3. Interval progression of chronic microvascular ischemic changes and parenchymal volume loss of the brain. These results were called by telephone at the time of interpretation on 06/11/2017 at 6:20 pm to Dr. Mariea Clonts, who verbally acknowledged these results. Electronically Signed   By: Kristine Garbe M.D.   On: 06/11/2017 18:21      Assessment/Plan 1. Recent stroke with significant extracranial cerebrovascular disease.MRI suggests acute left hemispheric stroke. An MRA shows flow gap the petrous portions of the left carotid artery. A carotid duplex shows a known occlusion of the left internal carotid artery, velocities consistent with stenosis in the 50-69% range in the right internal carotid artery, and antegrade left vertebral artery, and a retrograde right vertebral artery consistent with right subclavian artery stenosis or occlusion. This is a very difficult situation given the symptomatic nature of his disease, and the multivessel issues. His left cerebral circulation is largely dependent on his right carotid and vertebral systems, and the subclavian steal was certainly a problem that could help create a low flow state and the cerebral circulation. The elevated velocities in the right carotid artery may be compensatory due to the contralateral occlusion, but certainly if this is a 60% or greater stenosis it should be repaired as well. CT angiogram can not be performed. We can consider a catheter-based angiogram with very limited contrast although there is still some nephrotoxicity risk. I do believe however this provide useful information for planning of possible revascularization that could be of benefit. I have discussed the  procedure with the patient and his wife and they would like to proceed with an angiogram. I will plan to do this tomorrow. I'm going to start him on fluid hydration to minimize the nephrotoxicity risk. I do not expect we would be able to perform treatment at the same time due  to contrast limitations, but that would be a possibility if appropriate. 2. 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 We will give him some hydration to limit his contrast toxicity and we will minimize contrast with his angiogram. 3. CAD. No current angina. Status post previous coronary bypass. 4. Hypertension. Stable on outpatient medications and avoidance of hypotension would be important to avoid neurologic events. 5. Diabetes. Stable on outpatient medications and blood glucose control important in reducing the progression of atherosclerotic disease. Also, involved in wound healing. On appropriate medications.    Leotis Pain, MD  06/12/2017 4:34 PM    This note was created with Dragon medical transcription system.  Any error is purely unintentional

## 2017-06-12 NOTE — Progress Notes (Signed)
Abnormal ultrasound of carotid results called to Dr. Anselm Jungling.  MD verbalized that he would speak with Dr. Doy Mince and Vascular about results.

## 2017-06-12 NOTE — Care Management Obs Status (Signed)
[place] NOTIFICATION   Patient Details  Name: [name] MRN: [id number] Date of Birth: 04-30-1941   Medicare Observation Status Notification Given:  Yes    Marshell Garfinkel, RN 06/12/2017, 8:35 AM

## 2017-06-13 ENCOUNTER — Encounter: Payer: Self-pay | Admitting: Student

## 2017-06-13 ENCOUNTER — Encounter
Admission: EM | Disposition: A | Discharge status: Home / Self Care | Payer: Self-pay | Source: Home / Self Care | Attending: Emergency Medicine

## 2017-06-13 DIAGNOSIS — I639 Cerebral infarction, unspecified: Secondary | ICD-10-CM | POA: Diagnosis not present

## 2017-06-13 DIAGNOSIS — N179 Acute kidney failure, unspecified: Secondary | ICD-10-CM | POA: Diagnosis not present

## 2017-06-13 DIAGNOSIS — I6523 Occlusion and stenosis of bilateral carotid arteries: Secondary | ICD-10-CM | POA: Diagnosis not present

## 2017-06-13 DIAGNOSIS — I771 Stricture of artery: Secondary | ICD-10-CM | POA: Diagnosis not present

## 2017-06-13 DIAGNOSIS — R748 Abnormal levels of other serum enzymes: Secondary | ICD-10-CM | POA: Diagnosis not present

## 2017-06-13 DIAGNOSIS — I10 Essential (primary) hypertension: Secondary | ICD-10-CM | POA: Diagnosis not present

## 2017-06-13 HISTORY — PX: CAROTID ANGIOGRAPHY: CATH118230

## 2017-06-13 LAB — BASIC METABOLIC PANEL
Anion gap: 6 (ref 5–15)
BUN: 49 mg/dL — AB (ref 6–20)
CHLORIDE: 111 mmol/L (ref 101–111)
CO2: 21 mmol/L — AB (ref 22–32)
CREATININE: 3.47 mg/dL — AB (ref 0.61–1.24)
Calcium: 8.6 mg/dL — ABNORMAL LOW (ref 8.9–10.3)
GFR calc Af Amer: 18 mL/min — ABNORMAL LOW (ref >60)
GFR calc non Af Amer: 16 mL/min — ABNORMAL LOW (ref >60)
Glucose, Bld: 93 mg/dL (ref 65–99)
POTASSIUM: 4.9 mmol/L (ref 3.5–5.1)
SODIUM: 138 mmol/L (ref 135–145)

## 2017-06-13 LAB — GLUCOSE, CAPILLARY
GLUCOSE-CAPILLARY: 96 mg/dL (ref 65–99)
GLUCOSE-CAPILLARY: 97 mg/dL (ref 65–99)
Glucose-Capillary: 94 mg/dL (ref 65–99)
Glucose-Capillary: 116 mg/dL — ABNORMAL HIGH (ref 65–99)
Glucose-Capillary: 153 mg/dL — ABNORMAL HIGH (ref 65–99)

## 2017-06-13 LAB — ECHOCARDIOGRAM COMPLETE
Height: 74 in
Weight: 3644.8 oz

## 2017-06-13 LAB — SURGICAL PCR SCREEN
MRSA, PCR: NEGATIVE
Staphylococcus aureus: NEGATIVE

## 2017-06-13 SURGERY — CAROTID ANGIOGRAPHY
Anesthesia: Moderate Sedation | Laterality: Right

## 2017-06-13 MED ORDER — ATROPINE SULFATE 1 MG/10ML IJ SOSY
PREFILLED_SYRINGE | INTRAMUSCULAR | Status: AC
  Filled 2017-06-13: qty 10

## 2017-06-13 MED ORDER — SODIUM CHLORIDE 0.9 % IV SOLN
INTRAVENOUS | Status: DC
  Administered 2017-06-13 (×2): via INTRAVENOUS

## 2017-06-13 MED ORDER — INSULIN DETEMIR 100 UNIT/ML ~~LOC~~ SOLN
15.0000 [IU] | Freq: Once | SUBCUTANEOUS | Status: AC
  Administered 2017-06-13: 15 [IU] via SUBCUTANEOUS
  Filled 2017-06-13 (×2): qty 0.15

## 2017-06-13 MED ORDER — SODIUM CHLORIDE 0.9 % IV SOLN: INTRAVENOUS | Status: DC

## 2017-06-13 MED ORDER — DEXTROSE 5 % IV SOLN
1.5000 g | INTRAVENOUS | Status: AC
  Administered 2017-06-13: 1.5 g via INTRAVENOUS
  Filled 2017-06-13: qty 1.5

## 2017-06-13 MED ORDER — HEPARIN SODIUM (PORCINE) 1000 UNIT/ML IJ SOLN
INTRAMUSCULAR | Status: DC | PRN
  Administered 2017-06-13: 3000 [IU] via INTRAVENOUS

## 2017-06-13 MED ORDER — MIDAZOLAM HCL 2 MG/2ML IJ SOLN
INTRAMUSCULAR | Status: DC | PRN
  Administered 2017-06-13: 1 mg via INTRAVENOUS
  Administered 2017-06-13: 2 mg via INTRAVENOUS

## 2017-06-13 MED ORDER — FENTANYL CITRATE (PF) 100 MCG/2ML IJ SOLN
INTRAMUSCULAR | Status: AC
  Filled 2017-06-13: qty 2

## 2017-06-13 MED ORDER — FENTANYL CITRATE (PF) 100 MCG/2ML IJ SOLN
INTRAMUSCULAR | Status: DC | PRN
  Administered 2017-06-13: 50 ug via INTRAVENOUS
  Administered 2017-06-13: 25 ug via INTRAVENOUS

## 2017-06-13 MED ORDER — ATENOLOL 50 MG PO TABS
50.0000 mg | ORAL_TABLET | Freq: Every day | ORAL | Status: DC
  Administered 2017-06-14: 50 mg via ORAL
  Filled 2017-06-13: qty 1

## 2017-06-13 MED ORDER — PHENYLEPHRINE HCL 10 MG/ML IJ SOLN
INTRAMUSCULAR | Status: AC
  Filled 2017-06-13: qty 1

## 2017-06-13 MED ORDER — LIDOCAINE-EPINEPHRINE (PF) 2 %-1:200000 IJ SOLN
INTRAMUSCULAR | Status: AC
  Filled 2017-06-13: qty 20

## 2017-06-13 MED ORDER — HEPARIN SODIUM (PORCINE) 1000 UNIT/ML IJ SOLN
INTRAMUSCULAR | Status: AC
  Filled 2017-06-13: qty 1

## 2017-06-13 MED ORDER — HEPARIN (PORCINE) IN NACL 2-0.9 UNIT/ML-% IJ SOLN
INTRAMUSCULAR | Status: AC
  Filled 2017-06-13: qty 1000

## 2017-06-13 MED ORDER — SODIUM CHLORIDE 0.9 % IV SOLN: INTRAVENOUS | Status: AC

## 2017-06-13 MED ORDER — IOPAMIDOL (ISOVUE-300) INJECTION 61%
INTRAVENOUS | Status: DC | PRN
  Administered 2017-06-13: 35 mL via INTRAVENOUS

## 2017-06-13 MED ORDER — MIDAZOLAM HCL 5 MG/5ML IJ SOLN
INTRAMUSCULAR | Status: AC
  Filled 2017-06-13: qty 5

## 2017-06-13 SURGICAL SUPPLY — 9 items
CATH BEACON 5 .035 100 H1 TIP (CATHETERS) ×2 IMPLANT
CATH SIZING 5F 100CM .035 PIG (CATHETERS) ×2 IMPLANT
DEVICE STARCLOSE SE CLOSURE (Vascular Products) ×2 IMPLANT
DEVICE TORQUE (MISCELLANEOUS) ×2 IMPLANT
GLIDEWIRE ANGLED SS 035X260CM (WIRE) ×2 IMPLANT
KIT CAROTID MANIFOLD (MISCELLANEOUS) ×2 IMPLANT
PACK ANGIOGRAPHY (CUSTOM PROCEDURE TRAY) ×2 IMPLANT
SHEATH BRITE TIP 6FRX11 (SHEATH) ×2 IMPLANT
WIRE J 3MM .035X145CM (WIRE) ×2 IMPLANT

## 2017-06-13 NOTE — Progress Notes (Signed)
PT Cancellation Note  Patient Details Name: [name] MRN: [id number] DOB: 12/29/1940   Cancelled Treatment:    Reason Eval/Treat Not Completed: Patient at procedure or test/unavailable.  Will continue to follow acutely.   Collie Siad PT, DPT 06/13/2017, 4:05 PM

## 2017-06-13 NOTE — H&P (Signed)
[facility] History & Physical Update  The patient was interviewed and re-examined.  The patient's previous History and Physical has been reviewed and is unchanged.  There is no change in the plan of care. We plan to proceed with the scheduled procedure.  Leotis Pain, MD  06/13/2017, 3:52 PM

## 2017-06-13 NOTE — Progress Notes (Signed)
Inpatient Diabetes Program Recommendations  AACE/ADA: New Consensus Statement on Inpatient Glycemic Control (2015)  Target Ranges:  Prepandial:   less than 140 mg/dL      Peak postprandial:   less than 180 mg/dL (1-2 hours)      Critically ill patients:  140 - 180 mg/dL   Lab Results  Component Value Date   GLUCAP 96 06/13/2017   HGBA1C 8.5 (H) 06/12/2017    Review of Glycemic Control  Results for DOMINYK, LAW (MRN [id number]) as of 06/13/2017 12:13  Ref. Range 06/12/2017 11:28 06/12/2017 16:40 06/12/2017 20:49 06/13/2017 07:29 06/13/2017 11:44  Glucose-Capillary Latest Ref Range: 65 - 99 mg/dL 186 (H) 168 (H) 201 (H) 94 96    Diabetes history: Type 2 Outpatient Diabetes medications: Levemir 30 units bid, Novolin R 15-16 units bid Current orders for Inpatient glycemic control: Levemir 30 units bid, Novolog 0-15 units tid, Novolog 0-5 units qhs  Inpatient Diabetes Program Recommendations:  Consider Levemir 15 units now prior to procedure then continue Levemir 30 units bid as ordered.  Gentry Fitz, RN, BA, MHA, CDE Diabetes Coordinator Inpatient Diabetes Program  [phone] (Team Pager) [phone] ([place]) 06/13/2017 12:19 PM

## 2017-06-13 NOTE — Op Note (Signed)
[facility]   OPERATIVE NOTE  DATE:  06/13/2017  PRE-OPERATIVE DIAGNOSIS: 1. Right carotid artery stenosis, left carotid occlusion, recent stroke 2. Chronic kidney disease precluding CT angiogram 3. Right subclavian artery stenosis or occlusion  POST-OPERATIVE DIAGNOSIS: Same as above  PROCEDURE: 1.   Ultrasound Guidance for vascular access right femoral artery 2.   Catheter placement into innominate artery and right common carotid artery from right femoral approach 3.   Thoracic aortogram and right subclavian and axillary angiogram 4.   Cervical and cerebral right carotid angiograms 5.   StarClose closure device right femoral artery  SURGEON: Leotis Pain, MD  ASSISTANT(S): None  ANESTHESIA: Moderate conscious sedation for 30 minutes with 3 mg of Versed and 75 g fentanyl  ESTIMATED BLOOD LOSS: Minimal  FLUORO TIME: 2.7 minutes  CONTRAST: 35 cc   FINDING(S): 1.  Normal thoracic aortogram although the left common carotid artery was small which would be expected from the distal occlusion. The right cervical carotid artery had a very irregular, calcific and somewhat ulcerated lesion of about 60-70%. There was the normal intracranial filling with brisk cross filling right to left. With the catheter pulled back into the innominate artery to evaluate the right subclavian artery, a very calcified lesion creating a total or near total occlusion of the proximal right subclavian artery was identified with poststenotic dilatation of the subclavian artery just beyond this and what appeared to be normal distal subclavian and axillary artery.  SPECIMEN(S):  None  INDICATIONS:   Patient is a 76 y.o.male who presents with a recent stroke. Duplex suggested moderate right carotid artery stenosis and a known left carotid artery occlusion. The right subclavian artery was also felt to be occluded due to retrograde right vertebral artery flow. The patient's renal function  precluded CT angiogram. Catheter-based angiogram is performed for further evaluation. Risks and benefits are discussed and informed consent was obtained.  DESCRIPTION: After obtaining full informed written consent, the patient was brought back to the operating room and placed supine upon the vascular suite table.  After obtaining adequate anesthesia, the patient was prepped and draped in the standard fashion.  Moderate conscious sedation was administered during a face to face encounter with the patient throughout the procedure with my supervision of the RN administering medicines and monitoring the patients vital signs and mental status throughout from the start of the procedure until the patient was taken to the recovery room. The right femoral artery was visualized with ultrasound and found to be calcific but patent. It was then accessed under direct ultrasound guidance without difficulty with a Seldinger needle. A J-wire and 5 French sheath were placed and a permanent image was recorded. The patient was given 3000 units of intravenous heparin. A pigtail catheter was placed into the ascending aorta and an LAO projection thoracic aortogram was performed. This showed normal origins of the great vessels although the left common carotid artery was small from the distal occlusion.  I then selected a headhunter catheter and cannulated the innominate artery advancing into the mid right common carotid artery. Cervical and cerebral carotid angiography were then performed on the right side initially. The intracranial flow was found to be normal with brisk cross filling from right to left and no obvious intracranial lesions. The cervical carotid artery was found to have a highly irregular, calcific, and somewhat ulcerated stenosis in the 60-70% range. I then turned my attention to the right subclavian lesion. The catheter was pulled back to the innominate  artery and RAO projection was performed to splay open the origin of  the subclavian artery. This demonstrated a highly calcific lesion which created a flow gap consistent with a short segment occlusion or near occlusive stenosis. The subclavian artery just beyond this had poststenotic dilatation with normalization of the distal subclavian artery and axillary artery. At this point, we had used about 35 cc of contrast and had enough imaging to help plan future revascularization. The diagnostic catheter was removed. Oblique arteriogram was performed of the right femoral artery and StarClose closure device was deployed in usual fashion with excellent hemostatic result. The patient tolerated the procedure well and was taken to the recovery room in stable condition.  COMPLICATIONS: None  CONDITION: Stable   Leotis Pain 06/13/2017 4:49 PM   This note was created with Dragon Medical transcription system. Any errors in dictation are purely unintentional.

## 2017-06-13 NOTE — Progress Notes (Signed)
[facility] [place] NAME: [name]    MR#:  [id number]  DATE OF BIRTH:  Jan 27, 1941  SUBJECTIVE:  CHIEF COMPLAINT:   Chief Complaint  Patient presents with  . Transient Ischemic Attack   Came with left-sided upper extremity weakness and numbness associated with speech problem and left-sided facial numbness. Symptoms resolved currently, MRI of the brain showed possibly new infarct. The patient has no symptoms.  REVIEW OF SYSTEMS:  CONSTITUTIONAL: No fever, fatigue or weakness.  EYES: No blurred or double vision.  EARS, NOSE, AND THROAT: No tinnitus or ear pain.  RESPIRATORY: No cough, shortness of breath, wheezing or hemoptysis.  CARDIOVASCULAR: No chest pain, orthopnea, edema.  GASTROINTESTINAL: No nausea, vomiting, diarrhea or abdominal pain.  GENITOURINARY: No dysuria, hematuria.  ENDOCRINE: No polyuria, nocturia,  HEMATOLOGY: No anemia, easy bruising or bleeding SKIN: No rash or lesion. MUSCULOSKELETAL: No joint pain or arthritis.   NEUROLOGIC: No tingling, numbness, weakness.  PSYCHIATRY: No anxiety or depression.   ROS  DRUG ALLERGIES:   Allergies  Allergen Reactions  . Ace Inhibitors Other (See Comments)    Reaction:  Raises potassium   . Quinapril Rash    hyperkalemia    VITALS:  Blood pressure (!) 184/81, pulse 64, temperature 98 F (36.7 C), temperature source Oral, resp. rate 18, height 6\' 2"  (1.88 m), weight 106.6 kg (235 lb), SpO2 100 %.  PHYSICAL EXAMINATION:  GENERAL:  76 y.o.-year-old patient lying in the bed with no acute distress.  EYES: Pupils equal, round, reactive to light and accommodation. No scleral icterus. Extraocular muscles intact.  HEENT: Head atraumatic, normocephalic. Oropharynx and nasopharynx clear.  NECK:  Supple, no jugular venous distention. No thyroid enlargement, no tenderness.  LUNGS: Normal breath sounds bilaterally, no wheezing, rales,rhonchi or crepitation. No use of accessory  muscles of respiration.  CARDIOVASCULAR: S1, S2 normal. No murmurs, rubs, or gallops.  ABDOMEN: Soft, nontender, nondistended. Bowel sounds present. No organomegaly or mass.  EXTREMITIES: No pedal edema, cyanosis, or clubbing.  NEUROLOGIC: Cranial nerves II through XII are intact. Muscle strength 5/5 in all extremities. Sensation intact. Gait not checked.  PSYCHIATRIC: The patient is alert and oriented x 3.  SKIN: No obvious rash, lesion, or ulcer.   Physical Exam LABORATORY PANEL:   CBC  Recent Labs Lab 06/11/17 1757  WBC 8.5  HGB 13.6  HCT 40.4  PLT 197   ------------------------------------------------------------------------------------------------------------------  Chemistries   Recent Labs Lab 06/11/17 1757 06/13/17 0421  NA 136 138  K 5.0 4.9  CL 111 111  CO2 19* 21*  GLUCOSE 166* 93  BUN 58* 49*  CREATININE 3.96* 3.47*  CALCIUM 8.3* 8.6*  AST 58*  --   ALT 70*  --   ALKPHOS 161*  --   BILITOT 0.4  --    ------------------------------------------------------------------------------------------------------------------  Cardiac Enzymes  Recent Labs Lab 06/12/17 0549 06/12/17 1334  TROPONINI 0.04* 0.03*   ------------------------------------------------------------------------------------------------------------------  RADIOLOGY:  Mr Jodene Nam Head Wo Contrast  Result Date: 06/12/2017 CLINICAL DATA:  Initial evaluation for left-sided upper extremity weakness and numbness with speech difficulty. EXAM: MRA HEAD WITHOUT CONTRAST TECHNIQUE: Angiographic images of the Circle of Willis were obtained using MRA technique without intravenous contrast. COMPARISON:  Prior CT and MRI from 06/11/2017. FINDINGS: ANTERIOR CIRCULATION: Chronic occlusion of the left ICA to the level of the terminus, similar to previous exams. Distal cervical right ICA widely patent with antegrade flow. Petrous, cavernous, and supraclinoid right ICA widely patent without flow-limiting stenosis.  Right ICA terminus widely patent. Right A1 segment widely patent. Widely patent anterior communicating artery. Anterior cerebral arteries widely patent to their distal aspects. Minimal filling of the left ICA terminus 7 and on 3D time-of-flight sequence (series 2, image 97). Patchy and attenuated flow within the distal left MCA branches, likely via collateral flow cross circle of Willis. POSTERIOR CIRCULATION: Vertebral arteries patent to the vertebrobasilar junction without flow-limiting stenosis. Right V4 segment somewhat attenuated as compared to the left on this exam. Posterior inferior cerebral arteries patent proximally. Basilar artery patent without flow-limiting stenosis. Superior cerebral arteries patent bilaterally. Left PCA primarily supplied via the basilar, although a tiny left posterior communicating artery noted. Left PCA widely patent to its distal aspect. Predominant fetal type origin of the right PCA supplied via at a right posterior communicating artery. Right PCA somewhat attenuated as compared to the left, which may be technical in origin due to fetal type origin. No appreciable flow-limiting stenosis. No aneurysm or vascular malformation. IMPRESSION: 1. Chronic left ICA occlusion to the level of the terminus. Minimal patchy and attenuated flow within the left MCA distribution via collateral flow across the circle of Willis and/ or from the posterior circulation. 2. Otherwise widely patent anterior circulation without flow-limiting stenosis. 3. Patent vertebrobasilar system without flow-limiting stenosis. Right PCA somewhat attenuated as compared to the left on this exam, suspected to be technical in nature due to fetal type origin. Electronically Signed   By: Jeannine Boga M.D.   On: 06/12/2017 16:08   Mr Brain Wo Contrast  Result Date: 06/11/2017 CLINICAL DATA:  76 y/o  M; TIA, initial exam.  Left-sided symptoms. EXAM: MRI HEAD WITHOUT CONTRAST TECHNIQUE: Multiplanar, multiecho  pulse sequences of the brain and surrounding structures were obtained without intravenous contrast. COMPARISON:  06/11/2017 CT of the head. FINDINGS: Brain: Punctate focus of of diffusion hyperintensity within left high parietal lobe with uncertain reduced diffusion may represent a recent infarction. No additional evidence for acute/early subacute infarction of the brain. A nonspecific T2 FLAIR hyperintense signal abnormality predominantly in periventricular white matter is compatible with the mild chronic microvascular ischemic changes for age and there is mild brain parenchymal volume loss. Small chronic infarcts are present within the right mid corona radiata, right frontal centrum semiovale, and the right cerebellar hemisphere. The there are punctate foci of susceptibility hypointensity in the right parietal cortex, right lateral frontal subcortical white matter, and right posterolateral temporal lobe compatible hemosiderin deposition of chronic microhemorrhage. Vascular: Loss of the left internal carotid artery flow void upper cervical, petrous, and cavernous segments may represent slow flow or occlusion. Persistent flow voids are present within the central circle of Willis and large dural venous sinuses. Skull and upper cervical spine: Normal marrow signal. Sinuses/Orbits: Negative. Other: Negative. IMPRESSION: 1. Possible recent left high parietal punctate cortical infarction. No additional evidence for acute/early subacute infarction of the brain. 2. Loss of the left internal carotid artery upper cervical, petrous, and cavernous segment flow void may represent slow flow or occlusion. 3. Mild for age chronic microvascular ischemic changes and mild parenchymal volume loss of the brain. Small chronic lacunar infarcts in right corona radiata, right frontal centrum semiovale, and right cerebellar hemisphere. These results were called by telephone at the time of interpretation on 06/11/2017 at 8:09 pm to Dr.  Eula Listen , who verbally acknowledged these results. Electronically Signed   By: Kristine Garbe M.D.   On: 06/11/2017 20:11   US Carotid Bilateral (at [facility] Only)  Result  Date: 06/12/2017 CLINICAL DATA:  Possible small left parietal punctate infarct. Chronic left ICA known occlusion EXAM: BILATERAL CAROTID DUPLEX ULTRASOUND TECHNIQUE: Pearline Cables scale imaging, color Doppler and duplex ultrasound were performed of bilateral carotid and vertebral arteries in the neck. COMPARISON:  12/10/2012, 06/11/2017 FINDINGS: Criteria: Quantification of carotid stenosis is based on velocity parameters that correlate the residual internal carotid diameter with NASCET-based stenosis levels, using the diameter of the distal internal carotid lumen as the denominator for stenosis measurement. The following velocity measurements were obtained: RIGHT ICA:  145/23 cm/sec CCA:  032/12 cm/sec SYSTOLIC ICA/CCA RATIO:  1.1 DIASTOLIC ICA/CCA RATIO:  1.7 ECA:  432 cm/sec LEFT ICA:  Chronically occluded RIGHT CAROTID ARTERY: Heterogeneous partially calcified right carotid atherosclerosis extends into the proximal ICA. Proximal ICA luminal narrowing noted by grayscale imaging. In this region there is mild velocity elevation measuring 145/23 cm/sec with only slight turbulent flow. Degree of stenosis estimated at 50- 69% by ultrasound criteria (closer to 50%). RIGHT VERTEBRAL ARTERY:  Retrograde LEFT CAROTID ARTERY:  Chronic occlusion of the left ICA. 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 Retrograde right vertebral artery flow, compatible with subclavian steal phenomenon Antegrade left vertebral artery flow Electronically Signed   By: Jerilynn Mages.  Shick M.D.   On: 06/12/2017 14:20   Ct Head Code Stroke Wo Contrast`  Result Date: 06/11/2017 CLINICAL DATA:  Code stroke. 76 y/o  M; left hand weakness, left facial droop, left facial numbness, slurred speech. Initial exam. EXAM: CT HEAD WITHOUT CONTRAST TECHNIQUE: Contiguous axial images were obtained from the base of the skull through the vertex without intravenous contrast. COMPARISON:  11/06/2016 CT of the head. FINDINGS: Brain: No acute vascular territory infarction, intracranial hemorrhage, or focal mass effect. Interval development of small lucencies in the right mid corona radiata and right frontal centrum semiovale probably represent chronic small vessel infarctions and small stable chronic lacunar infarct in the right cerebellar hemisphere. Mild interval increase in brain parenchymal volume loss and chronic microvascular ischemic changes. Vascular: Calcific atherosclerosis of carotid siphons. No hyperdense vessel identified. Skull: Normal. Negative for fracture or focal lesion. Sinuses/Orbits: No acute finding. Other: None. ASPECTS [facility] Stroke Program Early CT Score) - Ganglionic level infarction (caudate, lentiform nuclei, internal capsule, insula, M1-M3 cortex): 7 - Supraganglionic infarction (M4-M6 cortex): 3 Total score (0-10 with 10 being normal): 10 IMPRESSION: 1. No large acute infarct, intracranial hemorrhage, or focal mass effect. 2. ASPECTS is 10 3. Interval progression of chronic microvascular ischemic changes and parenchymal volume loss of the brain. These results were called by telephone at the time of interpretation on 06/11/2017 at 6:20 pm to Dr. Mariea Clonts, who verbally acknowledged these results. Electronically Signed   By: Kristine Garbe M.D.   On: 06/11/2017 18:21    ASSESSMENT AND PLAN:   Active Problems:   CVA (cerebral vascular accident) [facility])  This is a 76 y.o. male with a history of CAD s/p triple bypass, DM, HTN, HLD, hypothyroidism now being admitted with:  1. Left sided weakness /facial droop possible TIA -   recent left high parietal punctate cortical infarction- recent acute stroke,  not consistent with current symptoms  - Done Echo, Carotids- shows Subclavian steal syndrome- Vascular consult. - Checked CBC, BMP, Lipids, TFTs, A1C -  Neurochecks, O2, dysphagia screen, permissive hypertension.  -  Neurology, PT/OT, S/S consults.  - Meds: Daily aspirin 325mg  per home regimen,  add plavix as per neurologist. - Routine DVT Px: with Lovenox, SCDs, early ambulation - HBA1c high, counceleld for diet control. BP is now high, resume atenolol. - Appreciated neurology help- MRA brain, left ICA occlusion.   2. Elevated troponin in the setting of CKD, nonischemic EKG and no chest pain.  - Monitor on tele - Trend trops - Continue aspirin  3. AKI on CKD - gentle IVF hydration and recheck BMP in AM.    Now going for Vascular angiogram, so cont hydration and monitor tomorrow.  4. History of HLD - change to Atorvastatin due to stroke.  5. History of HTN - resume atenolol for now  6. History of DM - continue Humulin.  Accuchecks achs with RISS coverage  7. History of hypothyroidism - continue synthroid.  8. Subclavian steal syndrome- Vascular consult. Suggested vascular angiogram, awaited today.   All the records are reviewed and case discussed with Care Management/Social Workerr. Management plans discussed with the patient, family and they are in agreement.  CODE STATUS: full.  TOTAL TIME TAKING CARE OF THIS PATIENT: 35 minutes.    POSSIBLE D/C IN 1-2 DAYS, DEPENDING ON CLINICAL CONDITION.   Vaughan Basta M.D on 06/13/2017   Between 7am to 6pm - Pager - [phone]  After 6pm go to [email or web address] - password [email or web address] [facility]  Office  [phone]  CC: Primary care physician; Glean Hess, MD  Note: This dictation was prepared with Dragon dictation along with smaller phrase technology. Any transcriptional errors that result from this process are unintentional.

## 2017-06-14 DIAGNOSIS — N179 Acute kidney failure, unspecified: Secondary | ICD-10-CM | POA: Diagnosis not present

## 2017-06-14 DIAGNOSIS — I10 Essential (primary) hypertension: Secondary | ICD-10-CM | POA: Diagnosis not present

## 2017-06-14 DIAGNOSIS — I639 Cerebral infarction, unspecified: Secondary | ICD-10-CM | POA: Diagnosis not present

## 2017-06-14 DIAGNOSIS — R748 Abnormal levels of other serum enzymes: Secondary | ICD-10-CM | POA: Diagnosis not present

## 2017-06-14 LAB — BASIC METABOLIC PANEL
ANION GAP: 5 (ref 5–15)
BUN: 46 mg/dL — ABNORMAL HIGH (ref 6–20)
CHLORIDE: 113 mmol/L — AB (ref 101–111)
CO2: 22 mmol/L (ref 22–32)
Calcium: 8.4 mg/dL — ABNORMAL LOW (ref 8.9–10.3)
Creatinine, Ser: 3.03 mg/dL — ABNORMAL HIGH (ref 0.61–1.24)
GFR calc non Af Amer: 18 mL/min — ABNORMAL LOW (ref >60)
GFR, EST AFRICAN AMERICAN: 21 mL/min — AB (ref >60)
Glucose, Bld: 100 mg/dL — ABNORMAL HIGH (ref 65–99)
POTASSIUM: 4.7 mmol/L (ref 3.5–5.1)
Sodium: 140 mmol/L (ref 135–145)

## 2017-06-14 LAB — GLUCOSE, CAPILLARY
GLUCOSE-CAPILLARY: 80 mg/dL (ref 65–99)
Glucose-Capillary: 182 mg/dL — ABNORMAL HIGH (ref 65–99)

## 2017-06-14 LAB — CBC
HCT: 40.1 % (ref 40.0–52.0)
Hemoglobin: 13.4 g/dL (ref 13.0–18.0)
MCH: 31.2 pg (ref 26.0–34.0)
MCHC: 33.6 g/dL (ref 32.0–36.0)
MCV: 92.8 fL (ref 80.0–100.0)
Platelets: 170 10*3/uL (ref 150–440)
RBC: 4.32 MIL/uL — AB (ref 4.40–5.90)
RDW: 14 % (ref 11.5–14.5)
WBC: 8.7 10*3/uL (ref 3.8–10.6)

## 2017-06-14 MED ORDER — ATENOLOL 50 MG PO TABS: 50.0000 mg | ORAL_TABLET | Freq: Every day | ORAL | 0 refills | Status: DC

## 2017-06-14 MED ORDER — ATORVASTATIN CALCIUM 40 MG PO TABS: 40.0000 mg | ORAL_TABLET | Freq: Every day | ORAL | 0 refills | Status: DC

## 2017-06-14 MED ORDER — CLOPIDOGREL BISULFATE 75 MG PO TABS: 75.0000 mg | ORAL_TABLET | Freq: Every day | ORAL | 0 refills | Status: DC

## 2017-06-14 NOTE — Progress Notes (Signed)
Physical Therapy Treatment Patient Details Name: [name] MRN: [id number] DOB: 05-Jul-1941 Today's Date: 06/14/2017    History of Present Illness 76 y.o. male pt with a known history of CAD s/p triple bypass, DM, HTN, HLD, hypothyroidism, and cancer was in a usual state of health until 8 am 8/29 when he developed left hand and arm weakness associated with tingling of the left face, left facial droop and slurred speech.  He states he could not hold on to his keys. Presented to ED, at which time he feels his weakness is better but his face is still numb. CT negative. MRI indicates possible recent L high parietal punctate cortical infarct, but symptoms are not consistent with this.     PT Comments    Pt agreeable to PT; reports he has "felt better, and I have felt worse". Pt reports a mild burning sensation in R medial thigh post procedure that was performed yesterday. Advised pt speak to Dr. Lucky Cowboy regarding the duration of expected discomfort. Due to discomfort/burning pain, pt is noted to stagger to the right several times with ambulation with Min guard to Min a to steady and correct as well as self correction. Pt notes ambulating slower than baseline. Pt prior level is ambulation without assistive device, but does have a cane at home. Advised use of cane on left if able to improve stability/safety. Pt/family agreeable. If pt remains admitted trial cane next visit. Continue PT to progress strength and balance to improve functional mobility and allow for a safe return home.    Follow Up Recommendations  Outpatient PT     Equipment Recommendations  None recommended by PT    Recommendations for Other Services       Precautions / Restrictions Precautions Precautions: Fall Restrictions Weight Bearing Restrictions: No    Mobility  Bed Mobility Overal bed mobility: Independent                Transfers Overall transfer level: Independent Equipment used: None              General transfer comment: safe use of hands, no dizziness, steady  Ambulation/Gait Ambulation/Gait assistance: Min guard;Min assist Ambulation Distance (Feet): 240 Feet Assistive device: None Gait Pattern/deviations: Step-through pattern;Staggering right (occasional stagger R)   Gait velocity interpretation: Below normal speed for age/gender General Gait Details: Gait subjectively slower; occasional stagger to R when pt experiences a burning pain in R medial thigh requiring Min guard to Min A to steady pt, although mostly self corrected.    Stairs            Wheelchair Mobility    Modified Rankin (Stroke Patients Only)       Balance Overall balance assessment: Needs assistance Sitting-balance support: No upper extremity supported;Feet supported Sitting balance-Leahy Scale: Good     Standing balance support: No upper extremity supported Standing balance-Leahy Scale: Fair Standing balance comment: loss of balance to R with gait, with self correction and assist to correct                            Cognition Arousal/Alertness: Awake/alert Behavior During Therapy: [facility] for tasks assessed/performed Overall Cognitive Status: Within Functional Limits for tasks assessed                                        Exercises  General Comments        Pertinent Vitals/Pain Pain Assessment: 0-10 Pain Score: 2  Pain Location: RLE medial thigh Pain Descriptors / Indicators: Burning Pain Intervention(s): Monitored during session;Other (comment) (advised discuss with Dr. Lucky Cowboy regarding duration expected)    Home Living                      Prior Function            PT Goals (current goals can now be found in the care plan section) Progress towards PT goals: Progressing toward goals    Frequency    7X/week      PT Plan Current plan remains appropriate    Co-evaluation              AM-PAC PT "6 Clicks" Daily  Activity  Outcome Measure  Difficulty turning over in bed (including adjusting bedclothes, sheets and blankets)?: None Difficulty moving from lying on back to sitting on the side of the bed? : None Difficulty sitting down on and standing up from a chair with arms (e.g., wheelchair, bedside commode, etc,.)?: None Help needed moving to and from a bed to chair (including a wheelchair)?: A Little Help needed walking in hospital room?: A Little Help needed climbing 3-5 steps with a railing? : A Little 6 Click Score: 21    End of Session Equipment Utilized During Treatment: Gait belt Activity Tolerance: Patient tolerated treatment well;Patient limited by pain Patient left: in chair;with call bell/phone within reach;with family/visitor present   PT Visit Diagnosis: Unsteadiness on feet (R26.81)     Time: 2376-2831 PT Time Calculation (min) (ACUTE ONLY): 21 min  Charges:  $Gait Training: 8-22 mins                    G CodesLarae Grooms, PTA 06/14/2017, 11:52 AM

## 2017-06-14 NOTE — Progress Notes (Signed)
MD order received to discharge pt home today; verbally reviewed AVS with pt, gave Rxs to pt; no questions voiced at this time; pt discharged via wheelchair by nursing to the visitor's entrance

## 2017-06-14 NOTE — Discharge Summary (Signed)
[facility] NAME: [name]    MR#:  [id number]  DATE OF BIRTH:  Mar 01, 1941  DATE OF ADMISSION:  06/11/2017 ADMITTING PHYSICIAN: Harvie Bridge, DO  DATE OF DISCHARGE: 06/14/2017  PRIMARY CARE PHYSICIAN: Glean Hess, MD    ADMISSION DIAGNOSIS:  Elevated troponin [R74.8] Cerebrovascular accident (CVA), unspecified mechanism ([place]) [I63.9]  DISCHARGE DIAGNOSIS:  Active Problems:   CVA (cerebral vascular accident) ([place])   Carotid artery stenosis.  SECONDARY DIAGNOSIS:   Past Medical History:  Diagnosis Date  . Cancer ([place])   . Coronary artery disease   . Diabetes mellitus without complication ([place])   . Hyperlipidemia   . Hypertension   . Thyroid disease     HOSPITAL COURSE:   This is a 76 y.o.malewith a history of CAD s/p triple bypass, DM, HTN, HLD, hypothyroidismnow being admitted with:  1. Left sided weakness /facial droop possibleTIA -   recent left high parietal punctate cortical infarction- recent acute stroke, not consistent with current symptoms  - DoneEcho, Carotids- shows Subclavian steal syndrome- Vascular consult. - Checked CBC, BMP, Lipids, TFTs, A1C -  Neurochecks, O2, dysphagia screen, permissive hypertension.  -  Neurology, PT/OT, S/S consults.  - Meds: Daily aspirin 325mg  per home regimen, add plavix as per neurologist. - Routine DVT Px: with Lovenox, SCDs, early ambulation - HBA1c high, counceleld for diet control. BP is now high, resume atenolol. - Appreciated neurology help- MRA brain, left ICA occlusion.  - pt have no symptoms now, able to walk without any difficulty.  2. Elevated troponin in the setting of CKD, nonischemic EKG and no chest pain.  - Monitor on tele - Trend trops- stable. - Continue aspirin  3. AKI on CKD - gentle IVF hydration and recheck BMP in AM.  CKD stage 4   Now going for Vascular angiogram, so cont hydration and monitor tomorrow.  remained  stable, at baseline.  4. History of HLD - change to Atorvastatin due to stroke.  5. History of HTN - resume atenolol for now, increaed dose as BP was running high.  6. History of DM - continue Humulin. Accuchecks achs with RISS coverage  7. History of hypothyroidism - continue synthroid.  8. Subclavian steal syndrome- Vascular consult. Suggested vascular angiogram,     FINDING(S): 1.  Normal thoracic aortogram although the left common carotid artery was small which would be expected from the distal occlusion. The right cervical carotid artery had a very irregular, calcific and somewhat ulcerated lesion of about 60-70%. There was the normal intracranial filling with brisk cross filling right to left. With the catheter pulled back into the innominate artery to evaluate the right subclavian artery, a very calcified lesion creating a total or near total occlusion of the proximal right subclavian artery was identified with poststenotic dilatation of the subclavian artery just beyond this and what appeared to be normal distal subclavian and axillary artery.    I spoke to [facility] Vascular surgeon , as per his discussion with Dr. Lucky Cowboy - d/c home , if stable. Pt to call his office Tuesday to schedule appointment and they will decide on intervention on right carotid artery.  Pt is able to walk fine, have some tingling in thigh on walking, but no localized hematoma in groin and distal pulses on dorsalis pedis is good on right side, d/c home.  DISCHARGE CONDITIONS:   Stable.  CONSULTS OBTAINED:  Treatment Team:  Alexis Goodell, MD  DRUG ALLERGIES:  Allergies  Allergen Reactions  . Ace Inhibitors Other (See Comments)    Reaction:  Raises potassium   . Quinapril Rash    hyperkalemia    DISCHARGE MEDICATIONS:   Current Discharge Medication List    START taking these medications   Details  atorvastatin (LIPITOR) 40 MG tablet Take 1 tablet (40 mg total) by mouth daily at 6 PM. Qty:  30 tablet, Refills: 0    clopidogrel (PLAVIX) 75 MG tablet Take 1 tablet (75 mg total) by mouth daily. Qty: 30 tablet, Refills: 0      CONTINUE these medications which have CHANGED   Details  atenolol (TENORMIN) 50 MG tablet Take 1 tablet (50 mg total) by mouth daily. Qty: 30 tablet, Refills: 0      CONTINUE these medications which have NOT CHANGED   Details  aspirin EC 325 MG tablet Take 325 mg by mouth daily.    insulin NPH Human (HUMULIN N,NOVOLIN N) 100 UNIT/ML injection Inject 30 Units into the skin 2 (two) times daily.    insulin regular (NOVOLIN R,HUMULIN R) 100 units/mL injection Inject 15-16 Units into the skin 2 (two) times daily. Pt takes depending on blood sugar.    levothyroxine (SYNTHROID, LEVOTHROID) 300 MCG tablet Take 300 mcg by mouth daily before breakfast.    sodium bicarbonate 650 MG tablet Take 1 tablet by mouth 2 (two) times daily.    Blood Glucose Monitoring Suppl (RELION PRIME MONITOR) DEVI     glucose blood (RELION PRIME TEST) test strip     mupirocin cream (BACTROBAN) 2 % Apply 1 application topically 2 (two) times daily. Qty: 30 g, Refills: 1   Associated Diagnoses: Folliculitis      STOP taking these medications     simvastatin (ZOCOR) 20 MG tablet          DISCHARGE INSTRUCTIONS:    Call Dr. Bunnie Domino office on Tuesday to make appointment.  If you experience worsening of your admission symptoms, develop shortness of breath, life threatening emergency, suicidal or homicidal thoughts you must seek medical attention immediately by calling 911 or calling your MD immediately  if symptoms less severe.  You Must read complete instructions/literature along with all the possible adverse reactions/side effects for all the Medicines you take and that have been prescribed to you. Take any new Medicines after you have completely understood and accept all the possible adverse reactions/side effects.   Please note  You were cared for by a hospitalist  during your hospital stay. If you have any questions about your discharge medications or the care you received while you were in the hospital after you are discharged, you can call the unit and asked to speak with the hospitalist on call if the hospitalist that took care of you is not available. Once you are discharged, your primary care physician will handle any further medical issues. Please note that NO REFILLS for any discharge medications will be authorized once you are discharged, as it is imperative that you return to your primary care physician (or establish a relationship with a primary care physician if you do not have one) for your aftercare needs so that they can reassess your need for medications and monitor your lab values.    Today   CHIEF COMPLAINT:   Chief Complaint  Patient presents with  . Transient Ischemic Attack    HISTORY OF PRESENT ILLNESS:  Coen Miyasato  is a 76 y.o. male with a known history of CAD s/p triple bypass, DM, HTN,  HLD, hypothyroidism was in a usual state of health until 8 am today (>12hours ago) when he developed left hand and arm weakness associated with tingling of the left face, left facial droop and slurred spech.  He states he could not hold on to his keys. At this time he feels his weakness is better but his face is still numb.   He takes aspirin daily.   Otherwise there has been no change in status. Patient has been taking medication as prescribed and there has been no recent change in medication or diet.  There has been no recent illness, travel or sick contacts.    Patient denies headache, gait ataxia, fevers/chills, weakness, dizziness, chest pain, shortness of breath, N/V/C/D, abdominal pain, dysuria/frequency, changes in mental status.   VITAL SIGNS:  Blood pressure (!) 113/97, pulse 66, temperature 97.8 F (36.6 C), temperature source Oral, resp. rate 15, height 6\' 2"  (1.88 m), weight 106.6 kg (235 lb), SpO2 97 %.  I/O:   Intake/Output  Summary (Last 24 hours) at 06/14/17 1224 Last data filed at 06/14/17 1006  Gross per 24 hour  Intake          3238.79 ml  Output              400 ml  Net          2838.79 ml    PHYSICAL EXAMINATION:   GENERAL:  76 y.o.-year-old patient lying in the bed with no acute distress.  EYES: Pupils equal, round, reactive to light and accommodation. No scleral icterus. Extraocular muscles intact.  HEENT: Head atraumatic, normocephalic. Oropharynx and nasopharynx clear.  NECK:  Supple, no jugular venous distention. No thyroid enlargement, no tenderness.  LUNGS: Normal breath sounds bilaterally, no wheezing, rales,rhonchi or crepitation. No use of accessory muscles of respiration.  CARDIOVASCULAR: S1, S2 normal. No murmurs, rubs, or gallops.  ABDOMEN: Soft, nontender, nondistended. Bowel sounds present. No organomegaly or mass.  EXTREMITIES: No pedal edema, cyanosis, or clubbing. No local hematoma in right groin, dorsalis pedis pulses on right foot is good. NEUROLOGIC: Cranial nerves II through XII are intact. Muscle strength 5/5 in all extremities. Sensation intact. Gait not checked.  PSYCHIATRIC: The patient is alert and oriented x 3.  SKIN: No obvious rash, lesion, or ulcer.   DATA REVIEW:   CBC  Recent Labs Lab 06/14/17 0400  WBC 8.7  HGB 13.4  HCT 40.1  PLT 170    Chemistries   Recent Labs Lab 06/11/17 1757  06/14/17 0400  NA 136  < > 140  K 5.0  < > 4.7  CL 111  < > 113*  CO2 19*  < > 22  GLUCOSE 166*  < > 100*  BUN 58*  < > 46*  CREATININE 3.96*  < > 3.03*  CALCIUM 8.3*  < > 8.4*  AST 58*  --   --   ALT 70*  --   --   ALKPHOS 161*  --   --   BILITOT 0.4  --   --   < > = values in this interval not displayed.  Cardiac Enzymes  Recent Labs Lab 06/12/17 1334  TROPONINI 0.03*    Microbiology Results  Results for orders placed or performed during the hospital encounter of 06/11/17  Surgical PCR screen     Status: None   Collection Time: 06/13/17  5:40 AM   Result Value Ref Range Status   MRSA, PCR NEGATIVE NEGATIVE Final   Staphylococcus aureus NEGATIVE NEGATIVE Final  Comment: (NOTE) The Xpert SA Assay (FDA approved for NASAL specimens in patients 77 years of age and older), is one component of a comprehensive surveillance program. It is not intended to diagnose infection nor to guide or monitor treatment.     RADIOLOGY:  Mr Virgel Paling ZO Contrast  Result Date: 06/12/2017 CLINICAL DATA:  Initial evaluation for left-sided upper extremity weakness and numbness with speech difficulty. EXAM: MRA HEAD WITHOUT CONTRAST TECHNIQUE: Angiographic images of the Circle of Willis were obtained using MRA technique without intravenous contrast. COMPARISON:  Prior CT and MRI from 06/11/2017. FINDINGS: ANTERIOR CIRCULATION: Chronic occlusion of the left ICA to the level of the terminus, similar to previous exams. Distal cervical right ICA widely patent with antegrade flow. Petrous, cavernous, and supraclinoid right ICA widely patent without flow-limiting stenosis. Right ICA terminus widely patent. Right A1 segment widely patent. Widely patent anterior communicating artery. Anterior cerebral arteries widely patent to their distal aspects. Minimal filling of the left ICA terminus 7 and on 3D time-of-flight sequence (series 2, image 97). Patchy and attenuated flow within the distal left MCA branches, likely via collateral flow cross circle of Willis. POSTERIOR CIRCULATION: Vertebral arteries patent to the vertebrobasilar junction without flow-limiting stenosis. Right V4 segment somewhat attenuated as compared to the left on this exam. Posterior inferior cerebral arteries patent proximally. Basilar artery patent without flow-limiting stenosis. Superior cerebral arteries patent bilaterally. Left PCA primarily supplied via the basilar, although a tiny left posterior communicating artery noted. Left PCA widely patent to its distal aspect. Predominant fetal type origin of the  right PCA supplied via at a right posterior communicating artery. Right PCA somewhat attenuated as compared to the left, which may be technical in origin due to fetal type origin. No appreciable flow-limiting stenosis. No aneurysm or vascular malformation. IMPRESSION: 1. Chronic left ICA occlusion to the level of the terminus. Minimal patchy and attenuated flow within the left MCA distribution via collateral flow across the circle of Willis and/ or from the posterior circulation. 2. Otherwise widely patent anterior circulation without flow-limiting stenosis. 3. Patent vertebrobasilar system without flow-limiting stenosis. Right PCA somewhat attenuated as compared to the left on this exam, suspected to be technical in nature due to fetal type origin. Electronically Signed   By: Jeannine Boga M.D.   On: 06/12/2017 16:08   US Carotid Bilateral (at [facility] Only)  Result Date: 06/12/2017 CLINICAL DATA:  Possible small left parietal punctate infarct. Chronic left ICA known occlusion EXAM: BILATERAL CAROTID DUPLEX ULTRASOUND TECHNIQUE: Pearline Cables scale imaging, color Doppler and duplex ultrasound were performed of bilateral carotid and vertebral arteries in the neck. COMPARISON:  12/10/2012, 06/11/2017 FINDINGS: Criteria: Quantification of carotid stenosis is based on velocity parameters that correlate the residual internal carotid diameter with NASCET-based stenosis levels, using the diameter of the distal internal carotid lumen as the denominator for stenosis measurement. The following velocity measurements were obtained: RIGHT ICA:  145/23 cm/sec CCA:  109/60 cm/sec SYSTOLIC ICA/CCA RATIO:  1.1 DIASTOLIC ICA/CCA RATIO:  1.7 ECA:  432 cm/sec LEFT ICA:  Chronically occluded RIGHT CAROTID ARTERY: Heterogeneous partially calcified right carotid atherosclerosis extends into the proximal ICA. Proximal ICA luminal narrowing noted by grayscale imaging. In this region there is mild velocity elevation measuring 145/23  cm/sec with only slight turbulent flow. Degree of stenosis estimated at 50- 69% by ultrasound criteria (closer to 50%). RIGHT VERTEBRAL ARTERY:  Retrograde LEFT CAROTID ARTERY:  Chronic occlusion of the left ICA. LEFT VERTEBRAL ARTERY:  Antegrade IMPRESSION: Chronic left  ICA occlusion dating back to 05/02/2013 Moderate right ICA stenosis estimated at 50- 69% by ultrasound criteria (close to 50% rates). Retrograde right vertebral artery flow, compatible with subclavian steal phenomenon Antegrade left vertebral artery flow Electronically Signed   By: Jerilynn Mages.  Shick M.D.   On: 06/12/2017 14:20    EKG:   Orders placed or performed during the hospital encounter of 06/11/17  . EKG 12-Lead  . EKG 12-Lead  . ED EKG  . ED EKG      Management plans discussed with the patient, family and they are in agreement.  CODE STATUS:     Code Status Orders        Start     Ordered   06/11/17 2231  Full code  Continuous     06/11/17 2231    Code Status History    Date Active Date Inactive Code Status Order ID Comments User Context   12/25/2016  3:31 PM 12/26/2016  4:31 PM DNR [id number]  Hillary Bow, MD ED   04/23/2016  1:20 PM 04/26/2016 10:17 PM Full Code [id number]  Jeanie Cooks, MD Inpatient   04/21/2016  2:13 AM 04/23/2016  1:20 PM Full Code [id number]  Gonczy, [place], MD ED      TOTAL TIME TAKING CARE OF THIS PATIENT: 35 minutes.    Vaughan Basta M.D on 06/14/2017 at 12:24 PM  Between 7am to 6pm - Pager - [phone]  After 6pm go to [email or web address] - password [email or web address] [facility]  Office  [phone]  CC: Primary care physician; Glean Hess, MD   Note: This dictation was prepared with Dragon dictation along with smaller phrase technology. Any transcriptional errors that result from this process are unintentional.

## 2017-06-17 ENCOUNTER — Encounter: Payer: Self-pay | Admitting: Vascular Surgery

## 2017-06-18 ENCOUNTER — Encounter: Payer: Self-pay | Admitting: Vascular Surgery

## 2017-06-20 ENCOUNTER — Other Ambulatory Visit: Payer: Self-pay | Admitting: *Deleted

## 2017-06-20 ENCOUNTER — Encounter: Payer: Self-pay | Admitting: *Deleted

## 2017-06-20 NOTE — Patient Outreach (Signed)
[facility]) Care Management  06/20/2017  ARSHDEEP BOLGER [birth year] [id number]  HTA insurance referral Patient discharged 9/1 from [facility] DX: Transient Ischemic Attack, Right Carotid Stenosis.  Intitial call to patient,  [facility] information verified, explained reason for call and [facility]  transition of care program, patient gave verbal agreement to program.  Patient discussed his recent hospital stay and symptoms prior to admission. Patient reports he is doing pretty good since being at home, he reports that his balance is a little off and that he is using a cane. Patient denies having any new symptoms of weakness.  Patient verifies he is taking medications daily and his wife helps remind him. Outpatient Encounter Prescriptions as of 06/20/2017  Medication Sig  . aspirin EC 325 MG tablet Take 325 mg by mouth daily.  Marland Kitchen atenolol (TENORMIN) 50 MG tablet Take 1 tablet (50 mg total) by mouth daily.  Marland Kitchen atorvastatin (LIPITOR) 40 MG tablet Take 1 tablet (40 mg total) by mouth daily at 6 PM.  . Blood Glucose Monitoring Suppl ([place]) DEVI   . clopidogrel (PLAVIX) 75 MG tablet Take 1 tablet (75 mg total) by mouth daily.  Marland Kitchen glucose blood (RELION PRIME TEST) test strip   . insulin NPH Human (HUMULIN N,NOVOLIN N) 100 UNIT/ML injection Inject 30 Units into the skin 2 (two) times daily.  . insulin regular (NOVOLIN R,HUMULIN R) 100 units/mL injection Inject 15-16 Units into the skin 2 (two) times daily. Pt takes depending on blood sugar.  . levothyroxine (SYNTHROID, LEVOTHROID) 300 MCG tablet Take 300 mcg by mouth daily before breakfast.  . mupirocin cream (BACTROBAN) 2 % Apply 1 application topically 2 (two) times daily.  . sodium bicarbonate 650 MG tablet Take 1 tablet by mouth 2 (two) times daily.   No facility-administered encounter medications on file as of 06/20/2017.   Patient was recently discharged from hospital and all medications have been reviewed.   Patient report he  checks his blood sugar at least daily but does not keep a record, today's reading is 137. Discussed follow up medical appointment reports his wife helps him keep them straight. When asked about follow up with Dr.Dew he reports he go a call to see Dr.Fath, cardiology first and that appointment is scheduled for 9/11, and then they will know more about scheduling visit with Dr.Dew.  Plan Patient agreeable to [facility] care management transition of care program., and will receive weekly outreach calls, home visit has been scheduled for the next week.  Will send PCP barrier letter, will notify CMA of patient being [facility] active.  Reviewed with patient symptoms of stroke and need for immediate medical attention.  [name], RN, [place] Management Coordinator  [phone]- Mobile [phone]- Toll Free Main Office

## 2017-06-24 DIAGNOSIS — E1142 Type 2 diabetes mellitus with diabetic polyneuropathy: Secondary | ICD-10-CM | POA: Diagnosis not present

## 2017-06-24 DIAGNOSIS — I25118 Atherosclerotic heart disease of native coronary artery with other forms of angina pectoris: Secondary | ICD-10-CM | POA: Diagnosis not present

## 2017-06-24 DIAGNOSIS — I25718 Atherosclerosis of autologous vein coronary artery bypass graft(s) with other forms of angina pectoris: Secondary | ICD-10-CM | POA: Diagnosis not present

## 2017-06-24 DIAGNOSIS — I10 Essential (primary) hypertension: Secondary | ICD-10-CM | POA: Diagnosis not present

## 2017-06-26 ENCOUNTER — Encounter: Payer: Self-pay | Admitting: *Deleted

## 2017-06-26 ENCOUNTER — Other Ambulatory Visit: Payer: Self-pay | Admitting: *Deleted

## 2017-06-26 NOTE — Patient Outreach (Signed)
[place] [facility]) Care Management   06/26/2017  [name] 1940/12/26 [id number]  [name] is an 76 y.o. male  Subjective:   Patient reports  being a little stronger , balance improved and no new symptoms. Patient discussed recent visit to cardiology office and stress test on next week prior  to follow up with Dr.Dew for plan related to right carotid blockage.  Objective:  BP 136/60 (BP Location: Left Arm, Patient Position: Sitting, Cuff Size: Large)   Pulse 68   Resp 18  Ambulating independently in home gait .   Review of Systems  Constitutional: Negative.   HENT: Negative.   Eyes: Negative.   Respiratory: Negative.   Cardiovascular: Negative.   Gastrointestinal: Negative.   Genitourinary: Negative.   Musculoskeletal: Negative.   Skin: Negative.   Neurological: Negative for dizziness.  Endo/Heme/Allergies: Bruises/bleeds easily.  Psychiatric/Behavioral: Negative.     Physical Exam  Constitutional: He is oriented to person, place, and time. He appears well-developed and well-nourished.  Cardiovascular: Normal rate, normal heart sounds and intact distal pulses.   Respiratory: Effort normal and breath sounds normal.  GI: Soft.  Neurological: He is alert and oriented to person, place, and time.  Skin: Skin is warm and dry.  Psychiatric: He has a normal mood and affect. His behavior is normal. Judgment and thought content normal.    Encounter Medications:   Outpatient Encounter Prescriptions as of 06/26/2017  Medication Sig Note  . aspirin EC 325 MG tablet Take 325 mg by mouth daily.   Marland Kitchen atenolol (TENORMIN) 50 MG tablet Take 1 tablet (50 mg total) by mouth daily.   Marland Kitchen atorvastatin (LIPITOR) 40 MG tablet Take 1 tablet (40 mg total) by mouth daily at 6 PM.   . Blood Glucose Monitoring Suppl ([place]) DEVI    . clopidogrel (PLAVIX) 75 MG tablet Take 1 tablet (75 mg total) by mouth daily.   Marland Kitchen glucose blood (RELION PRIME TEST) test strip    .  insulin NPH Human (HUMULIN N,NOVOLIN N) 100 UNIT/ML injection Inject 30 Units into the skin 2 (two) times daily. 06/13/2017: Takes with breakfast and hs.  . insulin regular (NOVOLIN R,HUMULIN R) 100 units/mL injection Inject 15-16 Units into the skin 2 (two) times daily. Pt takes depending on blood sugar. 06/13/2017: Verified with patient and his wife- he takes this insulin with breakfast and with evening meal.   . levothyroxine (SYNTHROID, LEVOTHROID) 300 MCG tablet Take 300 mcg by mouth daily before breakfast.   . mupirocin cream (BACTROBAN) 2 % Apply 1 application topically 2 (two) times daily.   . sodium bicarbonate 650 MG tablet Take 1 tablet by mouth 2 (two) times daily.    No facility-administered encounter medications on file as of 06/26/2017.   Patient was recently discharged from hospital and all medications have been reviewed.  Functional Status:   In your present state of health, do you have any difficulty performing the following activities: 06/20/2017 06/11/2017  Hearing? N -  Vision? N -  Difficulty concentrating or making decisions? N -  Walking or climbing stairs? Y -  Dressing or bathing? N -  Doing errands, shopping? N N  Preparing Food and eating ? N -  Using the Toilet? N -  In the past six months, have you accidently leaked urine? N -  Do you have problems with loss of bowel control? N -  Managing your Medications? Y -  Comment wife helps -  Managing your Finances? N -  Housekeeping or managing your Housekeeping? Y -  Comment wife takes care -  Some recent data might be hidden    Fall/Depression Screening:    Fall Risk  06/20/2017 07/22/2016 03/19/2016  Falls in the past year? No No No  Risk for fall due to : Impaired balance/gait - -   PHQ 2/9 Scores 06/20/2017 07/22/2016 03/19/2016 03/16/2015  PHQ - 2 Score 0 0 0 0    Assessment:  Initial transition of care visit   1. Reviewed transition of care program, provided [facility] new patient packet, consent signed at visit. 2.  Recent TIA- no new symptoms, review of symptoms and action plan, attending follow up medical visits. Has already  scheduled PCP visit 9/24, patient declines arranging sooner post hospital visit.  3..Diabetes- patient and wife sharing blood sugar meter, patient checks blood sugar about 2 times a week. Reports taking insulin daily. Agreeable to getting meter for wife use,agreeable to calling PCP office for prescription of strips meter,lancet, offered to assist patient and wife states they will call.   4. Hypertension - will benefit from education on low salt diet, agreeable to limiting table salt added to food.Patient does not have machine to self monitor blood pressure, discussed options for assisting with getting a monitor, he request to hold off and discuss at our  next visit.  5. High Fall risk/Safety - home safety evaluation ,  using cane when outside home, gait steady in home,reported improved balance, no falls. Patient has low commode in bathroom he will purchase raised seat for commode, discussed option of bedside commode to use over commode, he will check prior option.  Patient denies need for outpatient physical therapy at this time, wants to wait until plans for carotid disease identified.  6.Advanced Directive- does not have advanced directives   Plan:  1. Will scan consent to record. Will continue weekly transition of care outreaches, next call in a week. 2.EMMi education handouts on Stroke/TIA prevention, hypertension.Reviewed with patient when call MD and seek medical attention. 3.Reviewed importance of monitoring blood sugars 4. Reviewed importance of self monitoring and limiting salt in diet, provided and reviewed THN low salt diet handout and daily limits.  5.Reviewed fall prevention/safety measure and equipment benefits.  6.Provided Advance directive packet and how to complete.  Care planning and goal setting during visit.    [facility] CM Care Plan Problem One     Most Recent Value   Care Plan Problem One  Patient with recent hospital admission related to Transient Ischemic Attack   Role Documenting the Problem One  Care Management [place] for Problem One  Active  THN Long Term Goal   Patient will not experience a hospital admission in the next 31 days   THN Long Term Goal Start Date  06/20/17  Interventions for Problem One Long Term Goal  Reviewed importance of notifying MD of new or worsening symptoms , taking medications as prescribed.   THN CM Short Term Goal #1   Patient will attend all medical appointments in the next 30 days   THN CM Short Term Goal #1 Start Date  06/20/17  Interventions for Short Term Goal #1  Reviewed upcoming follow up visits , and reminded regarding importance of attending all visit.   THN CM Short Term Goal #2   Patient will report increased knowledge of stroke symptoms in the next 30 days   THN CM Short Term Goal #2 Start Date  06/20/17  Interventions for Short Term Goal #  2  Provided and reviewed EMMI on preventing stroke and symptoms of stroke TIA and action plan  THN CM Short Term Goal #3  Patient will report monitoring and recording blood sugars daily in the next 30 days   THN CM Short Term Goal #3 Start Date  06/26/17  Interventions for Short [place]ern Goal #3  Educated on importance of montoring, blood sugars and keeping a record  to help with controlling diabetes , provided [facility] calendar and reviewed how to use to record readings .   THN CM Short Term Goal #4  Patient will report in knowledge of  limiting salt in diet in the next 30 days   THN CM Short Term Goal #4 Start Date  06/26/17  Interventions for Short Term Goal #4  Provided [facility]N high/low salt diet handout and reviewed, discussed salt daily limits and label reading.       [name], RN, [place] Management Coordinator  [phone]- Mobile [phone]- Toll Free Main Office

## 2017-06-30 DIAGNOSIS — I25118 Atherosclerotic heart disease of native coronary artery with other forms of angina pectoris: Secondary | ICD-10-CM | POA: Diagnosis not present

## 2017-07-03 DIAGNOSIS — I25718 Atherosclerosis of autologous vein coronary artery bypass graft(s) with other forms of angina pectoris: Secondary | ICD-10-CM | POA: Diagnosis not present

## 2017-07-03 DIAGNOSIS — I25118 Atherosclerotic heart disease of native coronary artery with other forms of angina pectoris: Secondary | ICD-10-CM | POA: Diagnosis not present

## 2017-07-03 DIAGNOSIS — R0989 Other specified symptoms and signs involving the circulatory and respiratory systems: Secondary | ICD-10-CM | POA: Diagnosis not present

## 2017-07-03 DIAGNOSIS — I10 Essential (primary) hypertension: Secondary | ICD-10-CM | POA: Diagnosis not present

## 2017-07-04 ENCOUNTER — Other Ambulatory Visit: Payer: Self-pay | Admitting: *Deleted

## 2017-07-04 NOTE — Patient Outreach (Signed)
[place] [facility]) Care Management  07/04/2017  ELMIN WIEDERHOLT 1940/11/18 [id number]   Telephone follow up  Spoke with patient report he is doing fair, denies any new symptoms of weakness, dizziness or stroke symptoms. Patient reports he had stress test on yesterday and her passed, next step is follow up with Dr.Dew once he gets result .  Patient reports his blood sugar this morning was 159, and he now has a separate meter for he and his wife use. Patient reports he continues to limit salt in diet, and has taken salt from the table.  Plan Will place follow up call to patient in the next week as part of the transition of care program .  [name], RN, [place] Management Coordinator  [phone]- Mobile [phone]- [place]

## 2017-07-07 ENCOUNTER — Encounter: Payer: Self-pay | Admitting: Internal Medicine

## 2017-07-07 ENCOUNTER — Ambulatory Visit (INDEPENDENT_AMBULATORY_CARE_PROVIDER_SITE_OTHER): Payer: PPO | Admitting: Internal Medicine

## 2017-07-07 VITALS — BP 142/74 | HR 62 | Ht 74.0 in | Wt 231.0 lb

## 2017-07-07 DIAGNOSIS — I639 Cerebral infarction, unspecified: Secondary | ICD-10-CM | POA: Diagnosis not present

## 2017-07-07 DIAGNOSIS — E1142 Type 2 diabetes mellitus with diabetic polyneuropathy: Secondary | ICD-10-CM

## 2017-07-07 DIAGNOSIS — E1169 Type 2 diabetes mellitus with other specified complication: Secondary | ICD-10-CM | POA: Diagnosis not present

## 2017-07-07 DIAGNOSIS — E1122 Type 2 diabetes mellitus with diabetic chronic kidney disease: Secondary | ICD-10-CM

## 2017-07-07 DIAGNOSIS — I129 Hypertensive chronic kidney disease with stage 1 through stage 4 chronic kidney disease, or unspecified chronic kidney disease: Secondary | ICD-10-CM

## 2017-07-07 DIAGNOSIS — N184 Chronic kidney disease, stage 4 (severe): Secondary | ICD-10-CM

## 2017-07-07 DIAGNOSIS — E785 Hyperlipidemia, unspecified: Secondary | ICD-10-CM

## 2017-07-07 DIAGNOSIS — Z23 Encounter for immunization: Secondary | ICD-10-CM | POA: Diagnosis not present

## 2017-07-07 NOTE — Progress Notes (Signed)
Date:  07/07/2017   Name:  [name]   DOB:  Dec 25, 1940   MRN:  [id number]   Chief Complaint: Hypertension; Diabetes; and Immunizations (Flu Shot) Hypertension  This is a chronic problem. The problem is controlled. Pertinent negatives include no chest pain, headaches, palpitations or shortness of breath. Past treatments include beta blockers. The current treatment provides significant improvement. Hypertensive end-organ damage includes kidney disease, CAD/MI and CVA.  Diabetes  He presents for his follow-up diabetic visit. He has type 2 diabetes mellitus. Pertinent negatives for hypoglycemia include no dizziness, headaches or tremors. Pertinent negatives for diabetes include no chest pain, no fatigue, no polydipsia, no polyuria and no weakness. Diabetic complications include a CVA and nephropathy. Current diabetic treatment includes intensive insulin program. He is compliant with treatment most of the time. An ACE inhibitor/angiotensin II receptor blocker is contraindicated. Eye exam is not current.  CVA - admitted 06/11/17 with facial droop that has resolved. 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 100 % Left carotid and 50% Right carotid.  Referred to out patient [facility]. Planning CEA on right in the near future. Recent myocardial stress test showed normal wall motion and normal LVF.   Review of Systems  Constitutional: Negative for chills, fatigue and fever.  Eyes: Negative for visual disturbance.  Respiratory: Negative for chest tightness and shortness of breath.   Cardiovascular: Negative for chest pain, palpitations and leg swelling.  Gastrointestinal: Negative for abdominal pain and constipation.  Endocrine: Negative for polydipsia and polyuria.  Genitourinary: Negative for difficulty urinating.  Musculoskeletal: Negative for arthralgias.  Skin: Negative for color change and rash.  Neurological: Negative for dizziness, tremors, weakness and headaches.    Psychiatric/Behavioral: Negative for dysphoric mood and sleep disturbance.    Patient Active Problem List   Diagnosis Date Noted  . CVA (cerebral vascular accident) ([place]) 06/11/2017  . Hyperglycemia 12/25/2016  . Chronic shoulder bursitis, left 07/22/2016  . Cholecystitis, acute 04/21/2016  . Uncontrolled type 2 diabetes mellitus with stage 4 chronic kidney disease, with long-term current use of insulin ([place]) 09/14/2015  . 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 CKD stage 4 due to type 2 diabetes mellitus ([place]) 03/16/2015  . Diabetes mellitus with polyneuropathy ([place]) 03/16/2015  . CAD in native artery 01/20/2015  . Gout 01/20/2015  . Diabetic peripheral neuropathy ([place]) 01/20/2015  . H/O malignant neoplasm of colon 01/20/2015  . Adult hypothyroidism 01/20/2015  . Hyperlipidemia associated with type 2 diabetes mellitus ([place]) 01/20/2015  . Encounter for long-term (current) use of insulin ([place]) 07/27/2014  . Disorder affecting the body's metabolism 07/27/2014  . High potassium 05/19/2014  . 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 Hypertension in stage 4 chronic kidney disease due to type 2 diabetes mellitus ([place]) 07/15/2013    Prior to Admission medications   Medication Sig Start Date End Date Taking? Authorizing Provider  aspirin EC 325 MG tablet Take 325 mg by mouth daily.    [provider]  atenolol (TENORMIN) 50 MG tablet Take 1 tablet (50 mg total) by mouth daily. 06/15/17   Vaughan Basta, MD  atorvastatin (LIPITOR) 40 MG tablet Take 1 tablet (40 mg total) by mouth daily at 6 PM. 06/14/17   Vaughan Basta, MD  Blood Glucose Monitoring Suppl ([place]) [place]  01/29/14   [provider]  clopidogrel (PLAVIX) 75 MG tablet Take 1 tablet (75 mg total) by mouth daily. 06/15/17   Vaughan Basta, MD  glucose blood (RELION PRIME  TEST) test strip  02/20/15   [provider]  insulin NPH Human  (HUMULIN N,NOVOLIN N) 100 UNIT/ML injection Inject 30 Units into the skin 2 (two) times daily.    [provider]  insulin regular (NOVOLIN R,HUMULIN R) 100 units/mL injection Inject 15-16 Units into the skin 2 (two) times daily. Pt takes depending on blood sugar.    [provider]  levothyroxine (SYNTHROID, LEVOTHROID) 300 MCG tablet Take 300 mcg by mouth daily before breakfast.    [provider]  mupirocin cream (BACTROBAN) 2 % Apply 1 application topically 2 (two) times daily. Patient not taking: Reported on 06/26/2017 07/22/16   Glean Hess, MD  sodium bicarbonate 650 MG tablet Take 1 tablet by mouth 2 (two) times daily. 04/28/17 04/28/18  [provider]    Allergies  Allergen Reactions  . Ace Inhibitors Other (See Comments)    Reaction:  Raises potassium   . Quinapril Rash    hyperkalemia    Past Surgical History:  Procedure Laterality Date  . APPENDECTOMY  2011  . CAROTID ANGIOGRAPHY Right 06/13/2017   Procedure: Right subclavian and Carotid Angiography, possible intervention;  Surgeon: Algernon Huxley, MD;  Location: [facility];  Service: Cardiovascular;  Laterality: Right;  . CHOLECYSTECTOMY N/A 04/23/2016   Procedure: LAPAROSCOPIC CHOLECYSTECTOMY;  Surgeon: [name], MD;  Location: [facility] ORS;  Service: General;  Laterality: N/A;  . COLON SURGERY  2011   Colectomy for ileo-cecal valve cancer  . CORONARY ARTERY BYPASS GRAFT  1997   x 3  . HERNIA REPAIR  2011   Ventral hernia  . KNEE SURGERY Left   . PROSTATE SURGERY  2002   BPH benign pathology  . SPINE SURGERY  1989   Lumbar disc    Social History  Substance Use Topics  . Smoking status: Former Smoker    Types: Cigarettes    Quit date: 06/24/1996  . Smokeless tobacco: Never Used  . Alcohol use No     Medication list has been reviewed and updated.  PHQ 2/9 Scores 06/20/2017 07/22/2016 03/19/2016 03/16/2015  PHQ - 2 Score 0 0 0 0    Physical Exam  Constitutional: He  is oriented to person, place, and time. He appears well-developed. No distress.  HENT:  Head: Normocephalic and atraumatic.  Neck: Normal range of motion. Neck supple.  Cardiovascular: Normal rate, regular rhythm and normal heart sounds.   Pulmonary/Chest: Effort normal and breath sounds normal. No respiratory distress. He has no wheezes.  Musculoskeletal: He exhibits no edema.  Neurological: He is alert and oriented to person, place, and time.  Skin: Skin is warm and dry. No rash noted.  Psychiatric: He has a normal mood and affect. His speech is normal and behavior is normal. Thought content normal.  Nursing note and vitals reviewed.   BP (!) 142/74 (BP Location: Left Arm, Patient Position: Sitting, Cuff Size: Large)   Pulse 62   Ht 6\' 2"  (1.88 m)   Wt 231 lb (104.8 kg)   SpO2 97%   BMI 29.66 kg/m   Assessment and Plan: 1. Hypertension in stage 4 chronic kidney disease due to type 2 diabetes mellitus ([place]) controlled - Comprehensive metabolic panel  2. Cerebrovascular accident (CVA), unspecified mechanism ([place]) Recovered, continue asa, plavix and atorvastatin  3. Diabetic peripheral neuropathy (HCC) stable - Comprehensive metabolic panel  4. Hyperlipidemia associated with type 2 diabetes mellitus (HCC) Check labs - Lipid panel  5. CKD stage 4 due to type  2 diabetes mellitus (HCC) Recent GFR 15 - repeat today  6. Need for influenza vaccination - Flu Vaccine QUAD 36+ mos IM   No orders of the defined types were placed in this encounter.   Partially dictated using Editor, commissioning. Any errors are unintentional.  Halina Maidens, MD [facility]  07/07/2017

## 2017-07-07 NOTE — Patient Instructions (Signed)
Need to schedule Diabetic Eye exam.

## 2017-07-08 ENCOUNTER — Other Ambulatory Visit (INDEPENDENT_AMBULATORY_CARE_PROVIDER_SITE_OTHER): Payer: Self-pay | Admitting: Vascular Surgery

## 2017-07-09 ENCOUNTER — Other Ambulatory Visit: Payer: Self-pay | Admitting: *Deleted

## 2017-07-09 NOTE — Patient Outreach (Signed)
[facility]) Care Management  07/09/2017  [name] [birth year] [id number]  Transition of care call   Spoke with patient reports things are going well,he reports Dr.Dew has arranged Right  Carotid surgery for next week, 10/4. Patient reports he has preop testing for tomorrow. .  Patient denies any new stroke symptoms, continues to tolerate mobility in home without problems. Patient reports blood sugar this morning 161, continues to take medications as prescribed. Patient reports he had a blood sugar of 56 a few days ago, report he used glucose tablets then ate a meal symptoms resolved, he did not recheck blood sugar afterwards. Reviewed rule of 15 for hypoglycemia.  Patient denies any new concerns at this time.  Plan Will continue weekly transition of care outreaches, next call within in week.    [name], RN, [place] Management Coordinator  [phone]- Mobile [phone]- Toll Free Main Office

## 2017-07-10 ENCOUNTER — Encounter
Admission: RE | Admit: 2017-07-10 | Discharge: 2017-07-10 | Disposition: A | Discharge status: Home / Self Care | Payer: PPO | Source: Ambulatory Visit | Attending: Vascular Surgery | Admitting: Vascular Surgery

## 2017-07-10 DIAGNOSIS — I6529 Occlusion and stenosis of unspecified carotid artery: Secondary | ICD-10-CM | POA: Diagnosis not present

## 2017-07-10 DIAGNOSIS — Z0183 Encounter for blood typing: Secondary | ICD-10-CM | POA: Diagnosis not present

## 2017-07-10 DIAGNOSIS — Z01812 Encounter for preprocedural laboratory examination: Secondary | ICD-10-CM | POA: Insufficient documentation

## 2017-07-10 HISTORY — DX: Pneumonia, unspecified organism: J18.9

## 2017-07-10 HISTORY — DX: Peripheral vascular disease, unspecified: I73.9

## 2017-07-10 HISTORY — DX: Cardiac arrhythmia, unspecified: I49.9

## 2017-07-10 HISTORY — DX: Acute myocardial infarction, unspecified: I21.9

## 2017-07-10 LAB — TYPE AND SCREEN
ABO/RH(D): O POS
Antibody Screen: NEGATIVE

## 2017-07-10 LAB — CBC WITH DIFFERENTIAL/PLATELET
BASOS PCT: 1 %
Basophils Absolute: 0.1 10*3/uL (ref 0–0.1)
EOS ABS: 0.2 10*3/uL (ref 0–0.7)
EOS PCT: 4 %
HCT: 39.2 % — ABNORMAL LOW (ref 40.0–52.0)
HEMOGLOBIN: 13.2 g/dL (ref 13.0–18.0)
Lymphocytes Relative: 35 %
Lymphs Abs: 2.3 10*3/uL (ref 1.0–3.6)
MCH: 31.2 pg (ref 26.0–34.0)
MCHC: 33.7 g/dL (ref 32.0–36.0)
MCV: 92.4 fL (ref 80.0–100.0)
MONOS PCT: 11 %
Monocytes Absolute: 0.7 10*3/uL (ref 0.2–1.0)
NEUTROS PCT: 49 %
Neutro Abs: 3.3 10*3/uL (ref 1.4–6.5)
PLATELETS: 123 10*3/uL — AB (ref 150–440)
RBC: 4.24 MIL/uL — ABNORMAL LOW (ref 4.40–5.90)
RDW: 13.7 % (ref 11.5–14.5)
WBC: 6.6 10*3/uL (ref 3.8–10.6)

## 2017-07-10 LAB — APTT: aPTT: 35 seconds (ref 24–36)

## 2017-07-10 LAB — BASIC METABOLIC PANEL
Anion gap: 7 (ref 5–15)
BUN: 46 mg/dL — ABNORMAL HIGH (ref 6–20)
CALCIUM: 8.3 mg/dL — AB (ref 8.9–10.3)
CO2: 19 mmol/L — AB (ref 22–32)
CREATININE: 3.1 mg/dL — AB (ref 0.61–1.24)
Chloride: 111 mmol/L (ref 101–111)
GFR calc non Af Amer: 18 mL/min — ABNORMAL LOW (ref >60)
GFR, EST AFRICAN AMERICAN: 21 mL/min — AB (ref >60)
Glucose, Bld: 97 mg/dL (ref 65–99)
Potassium: 4.8 mmol/L (ref 3.5–5.1)
SODIUM: 137 mmol/L (ref 135–145)

## 2017-07-10 LAB — PROTIME-INR
INR: 1.13
PROTHROMBIN TIME: 14.4 s (ref 11.4–15.2)

## 2017-07-10 LAB — SURGICAL PCR SCREEN
MRSA, PCR: NEGATIVE
Staphylococcus aureus: NEGATIVE

## 2017-07-10 NOTE — Patient Instructions (Addendum)
Your procedure is scheduled on:  July 17, 2017 (THURSDAY ) Report to Same Day Surgery 2nd floor medical mall ([place] [address]tr[address]-take elevator on left to 2nd floor.  Check in with surgery information desk.) To find out your arrival time please call [phone] between 1PM - 3PM on July 16, 2017 [facility] )   Remember: Instructions that are not followed completely may result in serious medical risk, up to and including death, or upon the discretion of your surgeon and anesthesiologist your surgery may need to be rescheduled.    _x___ 1. Do not eat food after midnight the night before your procedure. You may drink clear liquids up to 2 hours before you are scheduled to arrive at the hospital for your procedure.  Do not drink clear liquids within 2 hours of your scheduled arrival to the hospital.  Clear liquids include  --Water or Apple juice without pulp  --Clear carbohydrate beverage such as ClearFast or Gatorade  --Black Coffee or Clear Tea (No milk, no creamers, do not add anything to                  the coffee or Tea Type 1 and type 2 diabetics should only drink water.  No gum chewing or hard candies.     __x__ 2. No Alcohol for 24 hours before or after surgery.   __x__3. No Smoking for 24 prior to surgery.   ____  4. Bring all medications with you on the day of surgery if instructed.    __x__ 5. Notify your doctor if there is any change in your medical condition     (cold, fever, infections).     Do not wear jewelry, make-up, hairpins, clips or nail polish.  Do not wear lotions, powders, or perfumes.   Do not shave 48 hours prior to surgery. Men may shave face and neck.  Do not bring valuables to the hospital.    [facility] is not responsible for any belongings or valuables.               Contacts, dentures or bridgework may not be worn into surgery.  Leave your suitcase in the car. After surgery it may be brought to your room.  For patients admitted to the  hospital, discharge time is determined by your treatment team                       Patients discharged the day of surgery will not be allowed to drive home.  You will need someone to drive you home and stay with you the night of your procedure.    Please read over the following fact sheets that you were given:   [facility] Preparing for Surgery and or MRSA Information   TAKE THE FOLLOWING MEDICATIONS THE MORNING OF SURGERY WITH A SIP OF WATER :  1. ATENOLOL  2.LEVOTHYROXINE    ____Fleets enema or Magnesium Citrate as directed.   _x___ Use CHG Soap or sage wipes as directed on instruction sheet   ____ Use inhalers on the day of surgery and bring to hospital day of surgery  ____ Stop Metformin and Janumet 2 days prior to surgery.    __X__ Take 1/2 of usual insulin dose the night before surgery and none on the morning surgery. (TAKE ONE-HALF OF NPH INSULIN ON WEDNESDAY NIGHT, OCTOBER  3 AND NO INSULIN THE MORNING OF SURGERY. )      _x___ Follow recommendations from Cardiologist, Pulmonologist  or PCP regarding          stopping Aspirin, Coumadin, Plavix ,Eliquis, Effient, or Pradaxa, and Pletal. (STOP CLOPIDOGREL NOW )  X____Stop Anti-inflammatories such as Advil, Aleve, Ibuprofen, Motrin, Naproxen, Naprosyn, Goodies powders or aspirin products. OK to take Tylenol.                             _x___ Stop supplements until after surgery.  But may continue Vitamin D, Vitamin B, and multivitamin.     .   ____ Bring C-Pap to the hospital.

## 2017-07-15 ENCOUNTER — Telehealth: Payer: Self-pay | Admitting: Internal Medicine

## 2017-07-15 ENCOUNTER — Other Ambulatory Visit: Payer: Self-pay | Admitting: *Deleted

## 2017-07-15 NOTE — Telephone Encounter (Signed)
Called pt to schedule for Annual Wellness Visit with Nurse Health Advisor, my c/b # is [place]

## 2017-07-15 NOTE — Patient Outreach (Signed)
[place] [facility]) Care Management  07/15/2017  [name] [birth year] [id number]   Transition of care call  Placed call to patient no answer able to leave a HIPAA compliant message requesting a return call.  Plan Will await return call if no response will follow up with patient within in next week.  Noted patient has planned carotid procedure on 10/4, will follow progress and contact for transition of care.    [name], RN, [place] Management Coordinator  [phone]- Mobile [phone]- Toll Free Main Office

## 2017-07-16 MED ORDER — CEFAZOLIN SODIUM-DEXTROSE 2-4 GM/100ML-% IV SOLN
2.0000 g | INTRAVENOUS | Status: AC
  Administered 2017-07-17: 2 g via INTRAVENOUS

## 2017-07-17 ENCOUNTER — Inpatient Hospital Stay
Admission: RE | Admit: 2017-07-17 | Discharge: 2017-07-18 | DRG: 038 | Disposition: A | Discharge status: Home / Self Care | Payer: PPO | Source: Ambulatory Visit | Attending: Vascular Surgery | Admitting: Vascular Surgery

## 2017-07-17 ENCOUNTER — Encounter
Admission: RE | Disposition: A | Discharge status: Home / Self Care | Payer: Self-pay | Source: Ambulatory Visit | Attending: Vascular Surgery

## 2017-07-17 ENCOUNTER — Inpatient Hospital Stay: Payer: PPO | Admitting: Certified Registered"

## 2017-07-17 DIAGNOSIS — I129 Hypertensive chronic kidney disease with stage 1 through stage 4 chronic kidney disease, or unspecified chronic kidney disease: Secondary | ICD-10-CM | POA: Diagnosis not present

## 2017-07-17 DIAGNOSIS — Z9049 Acquired absence of other specified parts of digestive tract: Secondary | ICD-10-CM | POA: Diagnosis not present

## 2017-07-17 DIAGNOSIS — I6521 Occlusion and stenosis of right carotid artery: Secondary | ICD-10-CM | POA: Diagnosis not present

## 2017-07-17 DIAGNOSIS — Z951 Presence of aortocoronary bypass graft: Secondary | ICD-10-CM | POA: Diagnosis not present

## 2017-07-17 DIAGNOSIS — Z85038 Personal history of other malignant neoplasm of large intestine: Secondary | ICD-10-CM | POA: Diagnosis not present

## 2017-07-17 DIAGNOSIS — E039 Hypothyroidism, unspecified: Secondary | ICD-10-CM | POA: Diagnosis not present

## 2017-07-17 DIAGNOSIS — I252 Old myocardial infarction: Secondary | ICD-10-CM

## 2017-07-17 DIAGNOSIS — E1151 Type 2 diabetes mellitus with diabetic peripheral angiopathy without gangrene: Secondary | ICD-10-CM | POA: Diagnosis present

## 2017-07-17 DIAGNOSIS — I63239 Cerebral infarction due to unspecified occlusion or stenosis of unspecified carotid arteries: Secondary | ICD-10-CM | POA: Diagnosis present

## 2017-07-17 DIAGNOSIS — I6523 Occlusion and stenosis of bilateral carotid arteries: Principal | ICD-10-CM | POA: Diagnosis present

## 2017-07-17 DIAGNOSIS — E785 Hyperlipidemia, unspecified: Secondary | ICD-10-CM | POA: Diagnosis present

## 2017-07-17 DIAGNOSIS — Z8673 Personal history of transient ischemic attack (TIA), and cerebral infarction without residual deficits: Secondary | ICD-10-CM

## 2017-07-17 DIAGNOSIS — N189 Chronic kidney disease, unspecified: Secondary | ICD-10-CM | POA: Diagnosis not present

## 2017-07-17 DIAGNOSIS — I708 Atherosclerosis of other arteries: Secondary | ICD-10-CM | POA: Diagnosis not present

## 2017-07-17 DIAGNOSIS — E1122 Type 2 diabetes mellitus with diabetic chronic kidney disease: Secondary | ICD-10-CM | POA: Diagnosis present

## 2017-07-17 DIAGNOSIS — N184 Chronic kidney disease, stage 4 (severe): Secondary | ICD-10-CM | POA: Diagnosis not present

## 2017-07-17 DIAGNOSIS — I10 Essential (primary) hypertension: Secondary | ICD-10-CM | POA: Diagnosis not present

## 2017-07-17 DIAGNOSIS — I2510 Atherosclerotic heart disease of native coronary artery without angina pectoris: Secondary | ICD-10-CM | POA: Diagnosis present

## 2017-07-17 DIAGNOSIS — Z87891 Personal history of nicotine dependence: Secondary | ICD-10-CM | POA: Diagnosis not present

## 2017-07-17 DIAGNOSIS — I739 Peripheral vascular disease, unspecified: Secondary | ICD-10-CM | POA: Diagnosis not present

## 2017-07-17 HISTORY — DX: Cerebral infarction due to unspecified occlusion or stenosis of unspecified carotid artery: I63.239

## 2017-07-17 HISTORY — PX: ENDARTERECTOMY: SHX5162

## 2017-07-17 LAB — GLUCOSE, CAPILLARY
GLUCOSE-CAPILLARY: 76 mg/dL (ref 65–99)
GLUCOSE-CAPILLARY: 63 mg/dL — AB (ref 65–99)
GLUCOSE-CAPILLARY: 85 mg/dL (ref 65–99)
Glucose-Capillary: 86 mg/dL (ref 65–99)
Glucose-Capillary: 91 mg/dL (ref 65–99)

## 2017-07-17 LAB — ABO/RH: ABO/RH(D): O POS

## 2017-07-17 SURGERY — ENDARTERECTOMY, CAROTID
Anesthesia: General | Laterality: Right | Wound class: Clean

## 2017-07-17 MED ORDER — ACETAMINOPHEN 650 MG RE SUPP: 325.0000 mg | RECTAL | Status: DC | PRN

## 2017-07-17 MED ORDER — ATORVASTATIN CALCIUM 20 MG PO TABS
40.0000 mg | ORAL_TABLET | Freq: Every day | ORAL | Status: DC
Start: 2017-07-18 — End: 2017-07-18

## 2017-07-17 MED ORDER — CHLORHEXIDINE GLUCONATE CLOTH 2 % EX PADS: 6.0000 | MEDICATED_PAD | Freq: Once | CUTANEOUS | Status: DC

## 2017-07-17 MED ORDER — HYDRALAZINE HCL 20 MG/ML IJ SOLN
5.0000 mg | INTRAMUSCULAR | Status: DC | PRN
  Administered 2017-07-18: 5 mg via INTRAVENOUS
  Filled 2017-07-17: qty 1

## 2017-07-17 MED ORDER — ESMOLOL HCL-SODIUM CHLORIDE 2000 MG/100ML IV SOLN
25.0000 ug/kg/min | INTRAVENOUS | Status: DC
  Administered 2017-07-18: 25 ug/kg/min via INTRAVENOUS
  Filled 2017-07-17: qty 100

## 2017-07-17 MED ORDER — GUAIFENESIN-DM 100-10 MG/5ML PO SYRP
15.0000 mL | ORAL_SOLUTION | ORAL | Status: DC | PRN
  Filled 2017-07-17: qty 15

## 2017-07-17 MED ORDER — EPHEDRINE SULFATE 50 MG/ML IJ SOLN
INTRAMUSCULAR | Status: AC
Start: 2017-07-17 — End: ?
  Filled 2017-07-17: qty 1

## 2017-07-17 MED ORDER — ROCURONIUM BROMIDE 50 MG/5ML IV SOLN
INTRAVENOUS | Status: AC
  Filled 2017-07-17: qty 1

## 2017-07-17 MED ORDER — DEXTROSE 5 % IV SOLN
1.5000 g | Freq: Two times a day (BID) | INTRAVENOUS | Status: AC
  Administered 2017-07-17 – 2017-07-18 (×2): 1.5 g via INTRAVENOUS
  Filled 2017-07-17 (×2): qty 1.5

## 2017-07-17 MED ORDER — EVICEL 2 ML EX KIT
PACK | CUTANEOUS | Status: DC | PRN
  Administered 2017-07-17: 2 mL

## 2017-07-17 MED ORDER — FENTANYL CITRATE (PF) 100 MCG/2ML IJ SOLN
INTRAMUSCULAR | Status: AC
  Filled 2017-07-17: qty 2

## 2017-07-17 MED ORDER — ONDANSETRON HCL 4 MG/2ML IJ SOLN: 4.0000 mg | Freq: Once | INTRAMUSCULAR | Status: DC | PRN

## 2017-07-17 MED ORDER — LABETALOL HCL 5 MG/ML IV SOLN
10.0000 mg | INTRAVENOUS | Status: DC | PRN
  Administered 2017-07-18 (×2): 10 mg via INTRAVENOUS
  Filled 2017-07-17 (×2): qty 4

## 2017-07-17 MED ORDER — OXYCODONE-ACETAMINOPHEN 5-325 MG PO TABS
1.0000 | ORAL_TABLET | ORAL | Status: DC | PRN
  Administered 2017-07-18: 1 via ORAL
  Filled 2017-07-17 (×2): qty 1

## 2017-07-17 MED ORDER — HEPARIN SODIUM (PORCINE) 1000 UNIT/ML IJ SOLN
INTRAMUSCULAR | Status: DC | PRN
  Administered 2017-07-17: 7000 [IU] via INTRAVENOUS

## 2017-07-17 MED ORDER — CEFAZOLIN SODIUM-DEXTROSE 2-4 GM/100ML-% IV SOLN
INTRAVENOUS | Status: AC
  Filled 2017-07-17: qty 100

## 2017-07-17 MED ORDER — ONDANSETRON HCL 4 MG/2ML IJ SOLN
INTRAMUSCULAR | Status: AC
  Filled 2017-07-17: qty 2

## 2017-07-17 MED ORDER — DEXTROSE 50 % IV SOLN
25.0000 mL | Freq: Once | INTRAVENOUS | Status: AC
  Administered 2017-07-17: 25 mL via INTRAVENOUS

## 2017-07-17 MED ORDER — SODIUM CHLORIDE 0.9 % IV SOLN
INTRAVENOUS | Status: DC | PRN
  Administered 2017-07-17: 55 mL via INTRAMUSCULAR

## 2017-07-17 MED ORDER — NITROGLYCERIN IN D5W 200-5 MCG/ML-% IV SOLN
5.0000 ug/min | INTRAVENOUS | Status: DC
  Administered 2017-07-17 (×2): 5 ug/min via INTRAVENOUS

## 2017-07-17 MED ORDER — ROCURONIUM BROMIDE 100 MG/10ML IV SOLN
INTRAVENOUS | Status: DC | PRN
  Administered 2017-07-17: 30 mg via INTRAVENOUS
  Administered 2017-07-17: 20 mg via INTRAVENOUS
  Administered 2017-07-17: 45 mg via INTRAVENOUS
  Administered 2017-07-17: 5 mg via INTRAVENOUS

## 2017-07-17 MED ORDER — LIDOCAINE HCL (CARDIAC) 20 MG/ML IV SOLN
INTRAVENOUS | Status: DC | PRN
  Administered 2017-07-17: 80 mg via INTRAVENOUS

## 2017-07-17 MED ORDER — SUGAMMADEX SODIUM 200 MG/2ML IV SOLN
INTRAVENOUS | Status: AC
  Filled 2017-07-17: qty 2

## 2017-07-17 MED ORDER — PHENOL 1.4 % MT LIQD
1.0000 | OROMUCOSAL | Status: DC | PRN
  Filled 2017-07-17: qty 177

## 2017-07-17 MED ORDER — ONDANSETRON HCL 4 MG/2ML IJ SOLN: 4.0000 mg | Freq: Four times a day (QID) | INTRAMUSCULAR | Status: DC | PRN

## 2017-07-17 MED ORDER — MORPHINE SULFATE (PF) 2 MG/ML IV SOLN
2.0000 mg | INTRAVENOUS | Status: DC | PRN
Start: 2017-07-17 — End: 2017-07-18

## 2017-07-17 MED ORDER — INSULIN ASPART 100 UNIT/ML ~~LOC~~ SOLN
15.0000 [IU] | Freq: Every day | SUBCUTANEOUS | Status: DC
Start: 2017-07-18 — End: 2017-07-18
  Administered 2017-07-18: 15 [IU] via SUBCUTANEOUS
  Filled 2017-07-17: qty 1

## 2017-07-17 MED ORDER — ALUM & MAG HYDROXIDE-SIMETH 200-200-20 MG/5ML PO SUSP
15.0000 mL | ORAL | Status: DC | PRN
  Filled 2017-07-17: qty 30

## 2017-07-17 MED ORDER — LIDOCAINE HCL 1 % IJ SOLN
INTRAMUSCULAR | Status: DC | PRN
  Administered 2017-07-17: 10 mL

## 2017-07-17 MED ORDER — NITROGLYCERIN IN D5W 200-5 MCG/ML-% IV SOLN
INTRAVENOUS | Status: AC
  Administered 2017-07-17: 5 ug/min via INTRAVENOUS
  Filled 2017-07-17: qty 250

## 2017-07-17 MED ORDER — FAMOTIDINE 20 MG PO TABS
ORAL_TABLET | ORAL | Status: AC
  Administered 2017-07-17: 20 mg via ORAL
  Filled 2017-07-17: qty 1

## 2017-07-17 MED ORDER — SUCCINYLCHOLINE CHLORIDE 20 MG/ML IJ SOLN
INTRAMUSCULAR | Status: DC | PRN
  Administered 2017-07-17: 100 mg via INTRAVENOUS

## 2017-07-17 MED ORDER — ACETAMINOPHEN 325 MG PO TABS: 325.0000 mg | ORAL_TABLET | ORAL | Status: DC | PRN

## 2017-07-17 MED ORDER — NITROGLYCERIN 0.2 MG/ML ON CALL CATH LAB
INTRAVENOUS | Status: DC | PRN
  Administered 2017-07-17: 20 ug via INTRAVENOUS
  Administered 2017-07-17 (×2): 40 ug via INTRAVENOUS
  Administered 2017-07-17: 20 ug via INTRAVENOUS
  Administered 2017-07-17: 40 ug via INTRAVENOUS
  Administered 2017-07-17 (×3): 20 ug via INTRAVENOUS
  Administered 2017-07-17: 40 ug via INTRAVENOUS

## 2017-07-17 MED ORDER — DOCUSATE SODIUM 100 MG PO CAPS
100.0000 mg | ORAL_CAPSULE | Freq: Every day | ORAL | Status: DC
  Administered 2017-07-18: 100 mg via ORAL
  Filled 2017-07-17: qty 1

## 2017-07-17 MED ORDER — PROPOFOL 10 MG/ML IV BOLUS
INTRAVENOUS | Status: DC | PRN
  Administered 2017-07-17: 100 mg via INTRAVENOUS

## 2017-07-17 MED ORDER — METOPROLOL TARTRATE 5 MG/5ML IV SOLN: 2.0000 mg | INTRAVENOUS | Status: DC | PRN

## 2017-07-17 MED ORDER — FAMOTIDINE IN NACL 20-0.9 MG/50ML-% IV SOLN
20.0000 mg | Freq: Two times a day (BID) | INTRAVENOUS | Status: DC
  Administered 2017-07-17 – 2017-07-18 (×2): 20 mg via INTRAVENOUS
  Filled 2017-07-17 (×2): qty 50

## 2017-07-17 MED ORDER — CEFAZOLIN SODIUM 1 G IJ SOLR
INTRAMUSCULAR | Status: AC
  Filled 2017-07-17: qty 10

## 2017-07-17 MED ORDER — CLOPIDOGREL BISULFATE 75 MG PO TABS
75.0000 mg | ORAL_TABLET | Freq: Every day | ORAL | Status: DC
  Administered 2017-07-17 – 2017-07-18 (×2): 75 mg via ORAL
  Filled 2017-07-17 (×2): qty 1

## 2017-07-17 MED ORDER — MIDAZOLAM HCL 2 MG/2ML IJ SOLN
INTRAMUSCULAR | Status: AC
  Filled 2017-07-17: qty 2

## 2017-07-17 MED ORDER — ONDANSETRON HCL 4 MG/2ML IJ SOLN
INTRAMUSCULAR | Status: DC | PRN
Start: 2017-07-17 — End: 2017-07-17
  Administered 2017-07-17: 4 mg via INTRAVENOUS

## 2017-07-17 MED ORDER — INSULIN DETEMIR 100 UNIT/ML ~~LOC~~ SOLN
30.0000 [IU] | Freq: Two times a day (BID) | SUBCUTANEOUS | Status: DC
  Administered 2017-07-18: 30 [IU] via SUBCUTANEOUS
  Filled 2017-07-17 (×3): qty 0.3

## 2017-07-17 MED ORDER — LIDOCAINE HCL (PF) 1 % IJ SOLN
INTRAMUSCULAR | Status: AC
  Filled 2017-07-17: qty 2

## 2017-07-17 MED ORDER — POTASSIUM CHLORIDE CRYS ER 20 MEQ PO TBCR: 20.0000 meq | EXTENDED_RELEASE_TABLET | Freq: Every day | ORAL | Status: DC | PRN

## 2017-07-17 MED ORDER — LEVOTHYROXINE SODIUM 100 MCG PO TABS
300.0000 ug | ORAL_TABLET | Freq: Every day | ORAL | Status: DC
  Administered 2017-07-18: 300 ug via ORAL
  Filled 2017-07-17: qty 3

## 2017-07-17 MED ORDER — SODIUM CHLORIDE 0.9 % IV SOLN: 500.0000 mL | Freq: Once | INTRAVENOUS | Status: DC | PRN

## 2017-07-17 MED ORDER — PROPOFOL 10 MG/ML IV BOLUS
INTRAVENOUS | Status: AC
  Filled 2017-07-17: qty 20

## 2017-07-17 MED ORDER — SODIUM CHLORIDE 0.9 % IV SOLN
INTRAVENOUS | Status: DC
  Administered 2017-07-17 – 2017-07-18 (×2): via INTRAVENOUS

## 2017-07-17 MED ORDER — HEPARIN SODIUM (PORCINE) 1000 UNIT/ML IJ SOLN
INTRAMUSCULAR | Status: AC
  Filled 2017-07-17: qty 1

## 2017-07-17 MED ORDER — MAGNESIUM SULFATE 2 GM/50ML IV SOLN: 2.0000 g | Freq: Every day | INTRAVENOUS | Status: DC | PRN

## 2017-07-17 MED ORDER — INSULIN ASPART 100 UNIT/ML ~~LOC~~ SOLN: 10.0000 [IU] | Freq: Every day | SUBCUTANEOUS | Status: DC

## 2017-07-17 MED ORDER — EVICEL 2 ML EX KIT
PACK | CUTANEOUS | Status: AC
  Filled 2017-07-17: qty 1

## 2017-07-17 MED ORDER — ATENOLOL 25 MG PO TABS
50.0000 mg | ORAL_TABLET | Freq: Every day | ORAL | Status: DC
  Administered 2017-07-17 – 2017-07-18 (×2): 50 mg via ORAL
  Filled 2017-07-17 (×2): qty 2

## 2017-07-17 MED ORDER — HEPARIN SODIUM (PORCINE) 10000 UNIT/ML IJ SOLN
INTRAMUSCULAR | Status: AC
  Filled 2017-07-17: qty 1

## 2017-07-17 MED ORDER — SEVOFLURANE IN SOLN
RESPIRATORY_TRACT | Status: AC
  Filled 2017-07-17: qty 250

## 2017-07-17 MED ORDER — FENTANYL CITRATE (PF) 100 MCG/2ML IJ SOLN
INTRAMUSCULAR | Status: DC | PRN
  Administered 2017-07-17: 50 ug via INTRAVENOUS
  Administered 2017-07-17: 100 ug via INTRAVENOUS
  Administered 2017-07-17: 50 ug via INTRAVENOUS

## 2017-07-17 MED ORDER — NITROGLYCERIN IN D5W 200-5 MCG/ML-% IV SOLN
INTRAVENOUS | Status: AC
  Filled 2017-07-17: qty 250

## 2017-07-17 MED ORDER — LIDOCAINE HCL (PF) 2 % IJ SOLN
INTRAMUSCULAR | Status: AC
  Filled 2017-07-17: qty 4

## 2017-07-17 MED ORDER — SUGAMMADEX SODIUM 200 MG/2ML IV SOLN
INTRAVENOUS | Status: DC | PRN
  Administered 2017-07-17: 220 mg via INTRAVENOUS

## 2017-07-17 MED ORDER — LIDOCAINE HCL (PF) 1 % IJ SOLN
INTRAMUSCULAR | Status: AC
  Filled 2017-07-17: qty 30

## 2017-07-17 MED ORDER — FAMOTIDINE 20 MG PO TABS
20.0000 mg | ORAL_TABLET | Freq: Once | ORAL | Status: AC
  Administered 2017-07-17: 20 mg via ORAL

## 2017-07-17 MED ORDER — EPHEDRINE SULFATE 50 MG/ML IJ SOLN
INTRAMUSCULAR | Status: DC | PRN
  Administered 2017-07-17: 10 mg via INTRAVENOUS

## 2017-07-17 MED ORDER — FENTANYL CITRATE (PF) 100 MCG/2ML IJ SOLN: 25.0000 ug | INTRAMUSCULAR | Status: DC | PRN

## 2017-07-17 MED ORDER — ASPIRIN EC 81 MG PO TBEC
81.0000 mg | DELAYED_RELEASE_TABLET | Freq: Every day | ORAL | Status: DC
  Administered 2017-07-17 – 2017-07-18 (×2): 81 mg via ORAL
  Filled 2017-07-17 (×2): qty 1

## 2017-07-17 MED ORDER — SODIUM CHLORIDE 0.9 % IV SOLN
INTRAVENOUS | Status: DC | PRN
  Administered 2017-07-17: 14:00:00 via INTRAVENOUS

## 2017-07-17 MED ORDER — SODIUM CHLORIDE 0.9 % IV SOLN
INTRAVENOUS | Status: DC
  Administered 2017-07-17 (×2): via INTRAVENOUS

## 2017-07-17 MED ORDER — MUPIROCIN CALCIUM 2 % EX CREA
1.0000 "application " | TOPICAL_CREAM | Freq: Two times a day (BID) | CUTANEOUS | Status: DC | PRN
  Filled 2017-07-17: qty 15

## 2017-07-17 MED ORDER — DEXTROSE 50 % IV SOLN
INTRAVENOUS | Status: AC
  Administered 2017-07-17: 25 mL via INTRAVENOUS
  Filled 2017-07-17: qty 50

## 2017-07-17 MED ORDER — SODIUM BICARBONATE 650 MG PO TABS
650.0000 mg | ORAL_TABLET | Freq: Two times a day (BID) | ORAL | Status: DC
  Administered 2017-07-17 – 2017-07-18 (×2): 650 mg via ORAL
  Filled 2017-07-17 (×3): qty 1

## 2017-07-17 SURGICAL SUPPLY — 68 items
BAG DECANTER FOR FLEXI CONT (MISCELLANEOUS) ×2 IMPLANT
BLADE SURG 15 STRL LF DISP TIS (BLADE) ×1 IMPLANT
BLADE SURG 15 STRL SS (BLADE) ×1
BLADE SURG SZ11 CARB STEEL (BLADE) ×2 IMPLANT
BOOT SUTURE AID YELLOW STND (SUTURE) ×2 IMPLANT
BRUSH SCRUB EZ  4% CHG (MISCELLANEOUS) ×1
BRUSH SCRUB EZ 4% CHG (MISCELLANEOUS) ×1 IMPLANT
CANISTER SUCT 1200ML W/VALVE (MISCELLANEOUS) ×2 IMPLANT
DERMABOND ADVANCED (GAUZE/BANDAGES/DRESSINGS) ×2
DERMABOND ADVANCED .7 DNX12 (GAUZE/BANDAGES/DRESSINGS) ×2 IMPLANT
DRAPE INCISE 23X17 IOBAN STRL (DRAPES) ×1
DRAPE INCISE IOBAN 23X17 STRL (DRAPES) ×1 IMPLANT
DRAPE INCISE IOBAN 66X45 STRL (DRAPES) ×2 IMPLANT
DRAPE LAPAROTOMY 77X122 PED (DRAPES) ×2 IMPLANT
DRAPE SHEET LG 3/4 BI-LAMINATE (DRAPES) ×2 IMPLANT
DRSG TEGADERM 4X4.75 (GAUZE/BANDAGES/DRESSINGS) IMPLANT
DRSG TELFA 3X8 NADH (GAUZE/BANDAGES/DRESSINGS) IMPLANT
DURAPREP 26ML APPLICATOR (WOUND CARE) ×4 IMPLANT
ELECT CAUTERY BLADE 6.4 (BLADE) ×2 IMPLANT
ELECT REM PT RETURN 9FT ADLT (ELECTROSURGICAL) ×2
ELECTRODE REM PT RTRN 9FT ADLT (ELECTROSURGICAL) ×1 IMPLANT
EVICEL 2ML SEALANT HUMAN (Miscellaneous) ×2 IMPLANT
GLOVE BIO SURGEON STRL SZ 6.5 (GLOVE) ×6 IMPLANT
GLOVE BIO SURGEON STRL SZ7 (GLOVE) ×6 IMPLANT
GLOVE BIOGEL PI IND STRL 7.0 (GLOVE) ×3 IMPLANT
GLOVE BIOGEL PI INDICATOR 7.0 (GLOVE) ×3
GLOVE INDICATOR 7.5 STRL GRN (GLOVE) ×2 IMPLANT
GOWN STRL REUS W/ TWL LRG LVL3 (GOWN DISPOSABLE) ×3 IMPLANT
GOWN STRL REUS W/ TWL XL LVL3 (GOWN DISPOSABLE) ×2 IMPLANT
GOWN STRL REUS W/TWL LRG LVL3 (GOWN DISPOSABLE) ×3
GOWN STRL REUS W/TWL XL LVL3 (GOWN DISPOSABLE) ×2
HEMOSTAT SURGICEL 2X3 (HEMOSTASIS) ×2 IMPLANT
HOLDER FOLEY CATH W/STRAP (MISCELLANEOUS) ×2 IMPLANT
IV NS 250ML (IV SOLUTION) ×1
IV NS 250ML BAXH (IV SOLUTION) ×1 IMPLANT
KIT RM TURNOVER STRD PROC AR (KITS) ×2 IMPLANT
LABEL OR SOLS (LABEL) ×2 IMPLANT
LOOP RED MAXI  1X406MM (MISCELLANEOUS) ×2
LOOP VESSEL MAXI 1X406 RED (MISCELLANEOUS) ×2 IMPLANT
LOOP VESSEL MINI 0.8X406 BLUE (MISCELLANEOUS) ×1 IMPLANT
LOOPS BLUE MINI 0.8X406MM (MISCELLANEOUS) ×1
NEEDLE FILTER BLUNT 18X 1/2SAF (NEEDLE) ×1
NEEDLE FILTER BLUNT 18X1 1/2 (NEEDLE) ×1 IMPLANT
NEEDLE HYPO 22GX1.5 SAFETY (NEEDLE) ×2 IMPLANT
NEEDLE HYPO 25X1 1.5 SAFETY (NEEDLE) ×2 IMPLANT
NS IRRIG 1000ML POUR BTL (IV SOLUTION) ×2 IMPLANT
PACK BASIN MAJOR ARMC (MISCELLANEOUS) ×2 IMPLANT
PENCIL ELECTRO HAND CTR (MISCELLANEOUS) IMPLANT
SHUNT W TPORT 9FR PRUITT F3 (SHUNT) ×2 IMPLANT
SUT MNCRL 4-0 (SUTURE) ×1
SUT MNCRL 4-0 27XMFL (SUTURE) ×1
SUT PROLENE 6 0 BV (SUTURE) ×10 IMPLANT
SUT PROLENE 7 0 BV 1 (SUTURE) ×4 IMPLANT
SUT PROLENE BV 1 BLUE 7-0 30IN (SUTURE) ×2 IMPLANT
SUT SILK 2 0 (SUTURE)
SUT SILK 2-0 18XBRD TIE 12 (SUTURE) IMPLANT
SUT SILK 3 0 (SUTURE)
SUT SILK 3-0 18XBRD TIE 12 (SUTURE) IMPLANT
SUT SILK 4 0 (SUTURE)
SUT SILK 4-0 18XBRD TIE 12 (SUTURE) IMPLANT
SUT VIC AB 3-0 SH 27 (SUTURE) ×2
SUT VIC AB 3-0 SH 27X BRD (SUTURE) ×2 IMPLANT
SUTURE MNCRL 4-0 27XMF (SUTURE) ×1 IMPLANT
SYR 20CC LL (SYRINGE) ×2 IMPLANT
SYRINGE 10CC LL (SYRINGE) ×4 IMPLANT
TOWEL OR 17X26 4PK STRL BLUE (TOWEL DISPOSABLE) ×2 IMPLANT
TRAY FOLEY W/METER SILVER 16FR (SET/KITS/TRAYS/PACK) ×2 IMPLANT
TUBING CONNECTING 10 (TUBING) IMPLANT

## 2017-07-17 NOTE — H&P (Signed)
[facility] History & Physical Update  The patient was interviewed and re-examined.  The patient's previous History and Physical has been reviewed and is unchanged.  There is no change in the plan of care. We plan to proceed with the scheduled procedure.  Leotis Pain, MD  07/17/2017, 12:14 PM

## 2017-07-17 NOTE — OR Nursing (Signed)
Dr Rosey Bath notified that patient took 1/2 does of insulin this am. Order to recheck at 11:45

## 2017-07-17 NOTE — Transfer of Care (Signed)
Immediate Anesthesia Transfer of Care Note  Patient: [name]  Procedure(s) Performed: ENDARTERECTOMY CAROTID (Right )  Patient Location: PACU  Anesthesia Type:General  Level of Consciousness: awake, alert  and oriented  Airway & Oxygen Therapy: Patient connected to face mask oxygen  Post-op Assessment: Post -op Vital signs reviewed and stable  Post vital signs: stable  Last Vitals:  Vitals:   07/17/17 1035 07/17/17 1612  BP: 129/75 (!) 176/94  Pulse: 68 73  Resp: 18 15  Temp: 36.6 C   SpO2: 95% 100%    Last Pain:  Vitals:   07/17/17 1035  TempSrc: Tympanic         Complications: No apparent anesthesia complications

## 2017-07-17 NOTE — Anesthesia Postprocedure Evaluation (Signed)
Anesthesia Post Note  Patient: [name]  Procedure(s) Performed: ENDARTERECTOMY CAROTID (Right )  Patient location during evaluation: PACU Anesthesia Type: General Level of consciousness: awake and alert Pain management: pain level controlled Vital Signs Assessment: post-procedure vital signs reviewed and stable Respiratory status: spontaneous breathing, nonlabored ventilation, respiratory function stable and patient connected to nasal cannula oxygen Cardiovascular status: blood pressure returned to baseline and stable Postop Assessment: no apparent nausea or vomiting Anesthetic complications: no     Last Vitals:  Vitals:   07/17/17 1750 07/17/17 1805  BP:  140/74  Pulse: 68 68  Resp: 14 12  Temp:  (!) 36.3 C  SpO2: 99% 100%    Last Pain:  Vitals:   07/17/17 1805  TempSrc:   PainSc: 0-No pain                 Precious Haws P[place]it[place]

## 2017-07-17 NOTE — Anesthesia Post-op Follow-up Note (Signed)
Anesthesia QCDR form completed.        

## 2017-07-17 NOTE — Anesthesia Preprocedure Evaluation (Signed)
Anesthesia Evaluation  Patient identified by MRN, date of birth, ID band Patient awake    Reviewed: Allergy & Precautions, H&P , NPO status , Patient's Chart, lab work & pertinent test results, reviewed documented beta blocker date and time   History of Anesthesia Complications Negative for: history of anesthetic complications  Airway Mallampati: II  TM Distance: >3 FB Neck ROM: full    Dental  (+) Poor Dentition, Teeth Intact, Dental Advidsory Given   Pulmonary neg pulmonary ROS, former smoker,           Cardiovascular Exercise Tolerance: Good hypertension, (-) angina+ CAD, + Past MI, + CABG and + Peripheral Vascular Disease  (-) Cardiac Stents + dysrhythmias (-) Valvular Problems/Murmurs     Neuro/Psych neg Seizures  Neuromuscular disease CVA, No Residual Symptoms negative psych ROS   GI/Hepatic negative GI ROS, Neg liver ROS,   Endo/Other  diabetes, Insulin DependentHypothyroidism   Renal/GU CRFRenal diseasenegative Renal ROS  negative genitourinary   Musculoskeletal   Abdominal   Peds  Hematology negative hematology ROS (+)   Anesthesia Other Findings Past Medical History: No date: Blood transfusion without reported diagnosis No date: Cancer ([place])     Comment:  Colon No date: Coronary artery disease No date: Diabetes mellitus without complication ([place]) No date: Dysrhythmia No date: Hyperlipidemia No date: Hypertension 1997: Myocardial infarction ([place]) No date: Peripheral vascular disease ([place]) 2014: Pneumonia 05/2017: Stroke ([place]) No date: Thyroid disease   Reproductive/Obstetrics negative OB ROS                             Anesthesia Physical  Anesthesia Plan  ASA: III  Anesthesia Plan: General   Post-op Pain Management:    Induction: Intravenous  PONV Risk Score and Plan: 2 and Ondansetron and Dexamethasone  Airway Management Planned: Oral ETT  Additional  Equipment: Arterial line  Intra-op Plan:   Post-operative Plan: Extubation in OR  Informed Consent: I have reviewed the patients History and Physical, chart, labs and discussed the procedure including the risks, benefits and alternatives for the proposed anesthesia with the patient or authorized representative who has indicated his/her understanding and acceptance.   Dental Advisory Given  Plan Discussed with: Anesthesiologist, CRNA and Surgeon  Anesthesia Plan Comments: (Echo from yesterday reveals ef 55%.. Cardiology states low risk.  JA)        Anesthesia Quick Evaluation

## 2017-07-17 NOTE — Anesthesia Procedure Notes (Signed)
Procedure Name: Intubation Date/Time: 07/17/2017 1:47 PM Performed by: Dionne Bucy Pre-anesthesia Checklist: Patient identified, Patient being monitored, Timeout performed, Emergency Drugs available and Suction available Patient Re-evaluated:Patient Re-evaluated prior to induction Oxygen Delivery Method: Circle system utilized Preoxygenation: Pre-oxygenation with 100% oxygen Induction Type: IV induction Ventilation: Mask ventilation without difficulty Laryngoscope Size: Mac and 3 Grade View: Grade I Tube type: Oral Tube size: 7.5 mm Number of attempts: 1 Airway Equipment and Method: Stylet Placement Confirmation: ETT inserted through vocal cords under direct vision,  positive ETCO2 and breath sounds checked- equal and bilateral Secured at: 23 cm Tube secured with: Tape Dental Injury: Teeth and Oropharynx as per pre-operative assessment

## 2017-07-17 NOTE — Op Note (Signed)
[facility]   OPERATIVE NOTE  PROCEDURE:   1.  Right carotid endarterectomy   PRE-OPERATIVE DIAGNOSIS: 1.  Right carotid stenosis with previous stroke 2. Left carotid occlusion  POST-OPERATIVE DIAGNOSIS: same as above   SURGEON: Leotis Pain, MD  ASSISTANT(S): none  ANESTHESIA: general  ESTIMATED BLOOD LOSS: 50 cc  FINDING(S): 1.  right carotid plaque.  SPECIMEN(S):  Carotid plaque (sent to Pathology)  INDICATIONS:   [name] is a 76 y.o. male who presents with stroke about 6-8 weeks ago and right carotid stenosis of 70%.  I discussed with the patient the risks, benefits, and alternatives to carotid endarterectomy.  I discussed the differences between carotid stenting and carotid endarterectomy. I discussed the procedural details of carotid endarterectomy with the patient.  The patient is aware that the risks of carotid endarterectomy include but are not limited to: bleeding, infection, stroke, myocardial infarction, death, cranial nerve injuries both temporary and permanent, neck hematoma, possible airway compromise, labile blood pressure post-operatively, cerebral hyperperfusion syndrome, and possible need for additional interventions in the future. The patient is aware of the risks and agrees to proceed forward with the procedure.  DESCRIPTION: After full informed written consent was obtained from the patient, the patient was brought back to the operating room and placed supine upon the operating table.  Prior to induction, the patient received IV antibiotics.  After obtaining adequate anesthesia, the patient was placed into a modified beach chair position with a shoulder roll in place and the patient's neck slightly hyperextended and rotated away from the surgical site.  The patient was prepped in the standard fashion for a carotid endarterectomy.  I made an incision anterior to the sternocleidomastoid muscle and dissected down through the subcutaneous  tissue.  The platysmas was opened with electrocautery.  Then I dissected down to the internal jugular vein and facial vein.  The facial vein is ligated and divided between 2-0 silk ties.  This was dissected posteriorly until I obtained visualization of the common carotid artery.  This was dissected out and then a vessel loop was placed around the common carotid artery.  I then dissected in a periadventitial fashion along the common carotid artery up to the bifurcation.  I then identified the external carotid artery and the superior thyroid artery.  I placed a vessel loop around the superior thyroid artery, and I also dissected out the external carotid artery and placed a vessel loop around it. In the process of this dissection, the hypoglossal nerve was identified and protected from harm.  I then dissected out the internal carotid artery until I identified an area in the internal carotid artery clearly above the stenosis.  I dissected slightly distal to this area, and placed a vessel loop around the artery.  At this point, we gave the patient 7000 units of intravenous heparin.  After this was allowed to circulate for several minutes, I pulled up control on the vessel loops to clamp the internal carotid artery, external carotid artery, superior thyroid artery, and then the common carotid artery.  I then made an arteriotomy in the common carotid artery with a 11 blade, and extended the arteriotomy with a Potts scissor down into the common carotid artery, then I carried the arteriotomy through the bifurcation into the internal carotid artery until I reached an area that was not diseased.  At this point, I took the [place] shunt that previously been prepared and I inserted it into the internal carotid artery first,  and then into the common carotid artery taking care to flush and de-air prior to release of control. At this point, I started the endarterectomy in the common carotid artery with a [place] elevator  and carried this dissection down into the common carotid artery circumferentially.  Then I transected the plaque at a segment where it was adherent and transected the plaque with Potts scissors.  I then carried this dissection up into the external carotid artery.  The plaque was extracted by unclamping the external carotid artery and performing an eversion endarterectomy.  The dissection was then carried into the internal carotid artery where a nice feathered end point was created with gentle traction.  I passed the plaque off the field as a specimen. At this point I removed all loose flecks and remaining disease possible.  At this point, I was satisfied that the minimal remaining disease was densely adherent to the wall and wall integrity was intact. The distal endpoint was tacked down with three 7-0 Prolene sutures.  Due to the generous size of the artery, I elected to perform a primary closure.  I started 6-0 Prolene suture at the distal endpoint and ran one half the length of the arteriotomy.  I started the second 6-0 Prolene at the proximal end point.  The proximal suture line was run approximately one quarter the length of the arteriotomy.  Prior to completing this suture line, I removed the shunt first from the internal carotid artery, from which there was excellent backbleeding, and clamped it.  Then I removed the shunt from the common carotid artery, from which there was excellent antegrade bleeding, and then clamped it.  At this point, I allowed the external carotid artery to backbleed, which was excellent.  Then I instilled heparinized saline in this patched artery and then completed the patch angioplasty in the usual fashion.  First, I released the clamp on the external carotid artery, then I released it on the common carotid artery.  After waiting a few seconds, I then released it on the internal carotid artery. Several minutes of pressure were held and 6-0 Prolene patch sutures were used as need for  hemostasis.  At this point, I placed Surgicel and Evicel topical hemostatic agents.  There was no more active bleeding in the surgical site.  The sternocleidomastoid space was closed with three interrupted 3-0 Vicryl sutures. I then reapproximated the platysma muscle with a running stitch of 3-0 Vicryl.  The skin was then closed with a running subcuticular 4-0 Monocryl.  The skin was then cleaned, dried and Dermabond was used to reinforce the skin closure.  The patient awakened and was taken to the recovery room in stable condition, following commands and moving all four extremities without any apparent deficits.    COMPLICATIONS: none  CONDITION: stable  Leotis Pain  07/17/2017, 3:46 PM   This note was created with Dragon Medical transcription system. Any errors in dictation are purely unintentional.

## 2017-07-17 NOTE — Anesthesia Procedure Notes (Signed)
Arterial Line Insertion Start/End10/01/2017 1:45 PM, 07/17/2017 1:50 PM Performed by: Dionne Bucy, CRNA  Patient location: OR. Preanesthetic checklist: patient identified, IV checked, site marked, risks and benefits discussed, surgical consent, monitors and equipment checked, pre-op evaluation, timeout performed and anesthesia consent Right, radial was placed Catheter size: 20 G Hand hygiene performed  and maximum sterile barriers used  Allen's test indicative of satisfactory collateral circulation Attempts: 1 Procedure performed without using ultrasound guided technique. Following insertion, dressing applied. Post procedure assessment: normal  Patient tolerated the procedure well with no immediate complications.

## 2017-07-17 NOTE — H&P (Signed)
[facility] Admission History & Physical  MRN : [id number]  [name] is a 76 y.o. ([birth year]) male who presents with chief complaint of No chief complaint on file. Marland Kitchen  History of Present Illness: Patient presents for treatment of his carotid artery stenosis. He has a known moderate, ulcerative lesion of the right carotid artery with a stroke about 5-6 weeks ago. He has multiple medical comorbidities. Even though he has a left carotid occlusion, his lesion is not ideal for carotid stenting so endarterectomy was recommended. No new complaints since his last procedure. No fevers or chills. No chest pain or shortness of breath. No recurrent neurologic symptoms since discharge.  Current Facility-Administered Medications  Medication Dose Route Frequency Provider Last Rate Last Dose  . 0.9 %  sodium chloride infusion   Intravenous Continuous Piscitello, Precious Haws, MD 25 mL/hr at 07/17/17 1046    . ceFAZolin (ANCEF) 2-4 GM/100ML-% IVPB           . ceFAZolin (ANCEF) IVPB 2g/100 mL premix  2 g Intravenous On Call to [place], PA-C      . Chlorhexidine Gluconate Cloth 2 % PADS 6 each  6 each Topical Once Stegmayer, Kimberly A, PA-C       And  . Chlorhexidine Gluconate Cloth 2 % PADS 6 each  6 each Topical Once Stegmayer, Janalyn Harder, PA-C        Past Medical History:  Diagnosis Date  . Blood transfusion without reported diagnosis   . Cancer [facility])    Colon  . Coronary artery disease   . Diabetes mellitus without complication ([place])   . Dysrhythmia   . Hyperlipidemia   . Hypertension   . Myocardial infarction ([place]) 1997  . Peripheral vascular disease ([place])   . Pneumonia 2014  . Stroke ([place]) 05/2017  . Thyroid disease     Past Surgical History:  Procedure Laterality Date  . APPENDECTOMY  2011  . BACK SURGERY    . CARDIAC CATHETERIZATION    . CAROTID ANGIOGRAPHY Right 06/13/2017   Procedure: Right subclavian and Carotid Angiography, possible  intervention;  Surgeon: Algernon Huxley, MD;  Location: [facility];  Service: Cardiovascular;  Laterality: Right;  . CHOLECYSTECTOMY N/A 04/23/2016   Procedure: LAPAROSCOPIC CHOLECYSTECTOMY;  Surgeon: [name], MD;  Location: [facility] ORS;  Service: General;  Laterality: N/A;  . COLON SURGERY  2011   Colectomy for ileo-cecal valve cancer  . CORONARY ARTERY BYPASS GRAFT  1997   x 3  . HERNIA REPAIR  2011   Ventral hernia  . KNEE SURGERY Left   . PROSTATE SURGERY  2002   BPH benign pathology  . SPINE SURGERY  1989   Lumbar disc    Social History Social History  Substance Use Topics  . Smoking status: Former Smoker    Types: Cigarettes    Quit date: 06/24/1996  . Smokeless tobacco: Never Used  . Alcohol use No  Married  Family History Family History  Problem Relation Age of Onset  . Cancer Mother        Lung  . Diabetes Mother   . Heart disease Mother   . Heart disease Father   No bleeding or clotting disorders  Allergies  Allergen Reactions  . Ace Inhibitors Other (See Comments)    Reaction:  Raises potassium   . Quinapril Rash    hyperkalemia     REVIEW OF SYSTEMS (Negative unless checked)  Constitutional: [] Weight loss  []   Fever  [] Chills Cardiac: [] Chest pain   [] Chest pressure   [x] Palpitations   [] Shortness of breath when laying flat   [] Shortness of breath at rest   [] Shortness of breath with exertion. Vascular:  [] Pain in legs with walking   [] Pain in legs at rest   [] Pain in legs when laying flat   [] Claudication   [] Pain in feet when walking  [] Pain in feet at rest  [] Pain in feet when laying flat   [] History of DVT   [] Phlebitis   [] Swelling in legs   [] Varicose veins   [] Non-healing ulcers Pulmonary:   [] Uses home oxygen   [] Productive cough   [] Hemoptysis   [] Wheeze  [] COPD   [] Asthma Neurologic:  [] Dizziness  [] Blackouts   [] Seizures   [x] History of stroke   [x] History of TIA  [] Aphasia   [] Temporary blindness   [] Dysphagia   [] Weakness or numbness  in arms   [] Weakness or numbness in legs Musculoskeletal:  [x] Arthritis   [] Joint swelling   [] Joint pain   [] Low back pain Hematologic:  [] Easy bruising  [] Easy bleeding   [] Hypercoagulable state   [] Anemic  [] Hepatitis Gastrointestinal:  [] Blood in stool   [] Vomiting blood  [] Gastroesophageal reflux/heartburn   [] Difficulty swallowing. Genitourinary:  [x] Chronic kidney disease   [] Difficult urination  [] Frequent urination  [] Burning with urination   [] Blood in urine Skin:  [] Rashes   [] Ulcers   [] Wounds Psychological:  [] History of anxiety   []  History of major depression.  Physical Examination  Vitals:   07/17/17 1035  BP: 129/75  Pulse: 68  Resp: 18  Temp: 97.8 F (36.6 C)  TempSrc: Tympanic  SpO2: 95%  Weight: 104.3 kg (230 lb)   Body mass index is 29.53 kg/m. Gen: WD/WN, NAD Head: [place]/AT, No temporalis wasting.  Ear/Nose/Throat: Hearing grossly intact, nares w/o erythema or drainage, oropharynx w/o Erythema/Exudate,  Eyes: Conjunctiva clear, sclera non-icteric Neck: Trachea midline.  No JVD. Bilateral carotid bruits Pulmonary:  Good air movement, respirations not labored, no use of accessory muscles.  Cardiac: RRR, normal S1, S2. Vascular:  Vessel Right Left  Radial Palpable Palpable                                    Musculoskeletal: M/S 5/5 throughout.  Extremities without ischemic changes.  No deformity or atrophy.  Neurologic: Sensation grossly intact in extremities.  Symmetrical.  Speech is fluent. Motor exam as listed above. Psychiatric: Judgment intact, Mood & affect appropriate for pt's clinical situation. Dermatologic: No rashes or ulcers noted.  No cellulitis or open wounds.      CBC Lab Results  Component Value Date   WBC 6.6 07/10/2017   HGB 13.2 07/10/2017   HCT 39.2 (L) 07/10/2017   MCV 92.4 07/10/2017   PLT 123 (L) 07/10/2017    BMET    Component Value Date/Time   NA 137 07/10/2017 1059   NA 132 (L) 12/24/2016 1541   NA 140  12/10/2012 0726   K 4.8 07/10/2017 1059   K 4.6 12/10/2012 0726   CL 111 07/10/2017 1059   CL 111 (H) 12/10/2012 0726   CO2 19 (L) 07/10/2017 1059   CO2 20 (L) 12/10/2012 0726   GLUCOSE 97 07/10/2017 1059   GLUCOSE 181 (H) 12/10/2012 0726   BUN 46 (H) 07/10/2017 1059   BUN 50 (H) 12/24/2016 1541   BUN 33 (H) 09/01/2013 0951   CREATININE 3.10 (H) 07/10/2017  1059   CREATININE 2.27 (H) 09/01/2013 0951   CALCIUM 8.3 (L) 07/10/2017 1059   CALCIUM 8.1 (L) 12/10/2012 0726   GFRNONAA 18 (L) 07/10/2017 1059   GFRNONAA 28 (L) 09/01/2013 0951   GFRAA 21 (L) 07/10/2017 1059   GFRAA 32 (L) 09/01/2013 0951   Estimated Creatinine Clearance: 25.7 mL/min (A) (by C-G formula based on SCr of 3.1 mg/dL (H)).  COAG Lab Results  Component Value Date   INR 1.13 07/10/2017   INR 1.07 06/11/2017   INR 1.0 09/01/2013    Radiology No results found.    Assessment/Plan 1. Ulcerated moderate right carotid artery stenosis with occluded left carotid artery and stroke. Right carotid endarterectomy to be performed. Risks and benefits discussed. 2. Right subclavian artery stenosis. Treating carotid inflow primarily. Consideration for subsequent percutaneous treatment or open surgical therapy to the right subclavian artery if recurrent symptoms present. 3. Chronic kidney disease. Stable. No contrast given with this procedure and renal risk would be low. 4. Hypertension. Stable on outpatient medications and blood pressure control important in reducing the progression of atherosclerotic disease. On appropriate oral medications.    Leotis Pain, MD  07/17/2017 12:15 PM

## 2017-07-18 ENCOUNTER — Encounter: Payer: Self-pay | Admitting: Vascular Surgery

## 2017-07-18 LAB — CBC
HEMATOCRIT: 35 % — AB (ref 40.0–52.0)
HEMOGLOBIN: 11.7 g/dL — AB (ref 13.0–18.0)
MCH: 31.1 pg (ref 26.0–34.0)
MCHC: 33.5 g/dL (ref 32.0–36.0)
MCV: 92.7 fL (ref 80.0–100.0)
Platelets: 118 10*3/uL — ABNORMAL LOW (ref 150–440)
RBC: 3.77 MIL/uL — AB (ref 4.40–5.90)
RDW: 13.9 % (ref 11.5–14.5)
WBC: 8.5 10*3/uL (ref 3.8–10.6)

## 2017-07-18 LAB — BASIC METABOLIC PANEL
Anion gap: 9 (ref 5–15)
BUN: 45 mg/dL — AB (ref 6–20)
CHLORIDE: 114 mmol/L — AB (ref 101–111)
CO2: 16 mmol/L — ABNORMAL LOW (ref 22–32)
Calcium: 7.5 mg/dL — ABNORMAL LOW (ref 8.9–10.3)
Creatinine, Ser: 2.83 mg/dL — ABNORMAL HIGH (ref 0.61–1.24)
GFR calc Af Amer: 23 mL/min — ABNORMAL LOW (ref >60)
GFR calc non Af Amer: 20 mL/min — ABNORMAL LOW (ref >60)
GLUCOSE: 108 mg/dL — AB (ref 65–99)
POTASSIUM: 5.4 mmol/L — AB (ref 3.5–5.1)
SODIUM: 139 mmol/L (ref 135–145)

## 2017-07-18 LAB — GLUCOSE, CAPILLARY
GLUCOSE-CAPILLARY: 133 mg/dL — AB (ref 65–99)
Glucose-Capillary: 79 mg/dL (ref 65–99)

## 2017-07-18 MED ORDER — FAMOTIDINE 20 MG PO TABS: 20.0000 mg | ORAL_TABLET | Freq: Two times a day (BID) | ORAL | Status: DC

## 2017-07-18 MED ORDER — OXYCODONE-ACETAMINOPHEN 5-325 MG PO TABS: 1.0000 | ORAL_TABLET | ORAL | 0 refills | Status: DC | PRN

## 2017-07-18 MED ORDER — ASPIRIN 81 MG PO TBEC: 81.0000 mg | DELAYED_RELEASE_TABLET | Freq: Every day | ORAL | 3 refills | Status: DC

## 2017-07-18 NOTE — Discharge Instructions (Signed)
Carotid Endarterectomy, Care After This sheet gives you information about how to care for yourself after your procedure. Your health care provider may also give you more specific instructions. If you have problems or questions, contact your health care provider. What can I expect after the procedure? After the procedure, it is common to have some pain or an ache in your neck for up to 2 weeks. This is normal. Follow these instructions at home: Incision care  Follow instructions from your health care provider about how to take care of your incision. Make sure you: ? Wash your hands with soap and water before you change your bandage (dressing). If soap and water are not available, use hand sanitizer. ? Change your dressing as told by your health care provider. ? Leave stitches (sutures), skin glue, or adhesive strips in place. These skin closures may need to stay in place for 2 weeks or longer. If adhesive strip edges start to loosen and curl up, you may trim the loose edges. Do not remove adhesive strips completely unless your health care provider tells you to do that.  Check your incision area every day for signs of infection. Check for: ? Redness, swelling, or pain. ? Fluid or blood. ? Warmth. ? Pus or a bad smell.  Do not take baths, swim, or use a hot tub until your health care provider approves. Ask your health care provider if you can take showers. Medicines  Take over-the-counter and prescription medicines only as told by your health care provider.  If you get a blood thinner (anticoagulant) medicine after surgery, take it exactly as directed.  Do not drive for 24 hours if you were given a sedative.  Do not drive or use heavy machinery while taking prescription pain medicine. Activity  Do not lift anything that is heavier than 10 lb (4.5 kg), or the limit that your health care provider tells you, until he or she says that it is safe.  Exercise regularly or as told by your health  care provider.  Return to your normal activities as told by your health care provider. Ask your health care provider what activities are safe for you. ? Recovery time varies depending on your age, general health, and other factors. You will likely be able to return to a normal lifestyle within a few weeks. While you recover, you may need help with some activities such as cleaning the house or shopping. General instructions  Do not use any products that contain nicotine or tobacco, such as cigarettes and e-cigarettes. If you need help quitting, ask your health care provider.  Drink enough fluid to keep your urine clear or pale yellow.  Take steps to control your blood pressure.  Work to reach and maintain a healthy body weight.  Eat a heart-healthy diet. Talk with your health care provider about how to lower blood lipids (cholesterol and triglycerides).  Avoid wearing tight clothing around your neck and your stitches.  Keep all follow-up visits as told by your health care provider. This is important. Contact a health care provider if:  You have signs of infection, such as: ? Redness, swelling, or pain around your incision. ? Fluid or blood coming from your incision. ? Your incision feeling warm to the touch. ? Pus or a bad smell coming from your incision. ? A fever.  You develop a rash.  You have difficulty speaking or you have changes in your voice. Get help right away if:  You have difficulty breathing.  You have chest pain or shortness of breath.  You suddenly develop any of these symptoms: ? Weakness or numbness in your face, arm, or leg, especially on one side of your body. ? Confusion. ? Trouble speaking, understanding, or both. ? Trouble seeing with one or both eyes. ? A severe headache with no known cause.  You have a seizure. Summary  After the procedure, it is common to have some pain or an ache in your neck for up to 2 weeks.  Follow instructions from your  health care provider about how to take care of your incision.  Keep all follow-up visits as told by your health care provider. This is important. This information is not intended to replace advice given to you by your health care provider. Make sure you discuss any questions you have with your health care provider. Document Released: 04/19/2005 Document Revised: 08/19/2016 Document Reviewed: 08/19/2016 Elsevier Interactive Patient Education  2017 [employer].

## 2017-07-18 NOTE — Progress Notes (Signed)
Patient discharged home with wife and daughter. IVs removed without complications. Prescriptions given to patient and instructions reviewed including follow up appointment. Patient voiced no concerns with discharge plans.

## 2017-07-18 NOTE — Progress Notes (Signed)
CONCERNING: IV to Oral Route Change Policy  RECOMMENDATION: This patient is receiving famotidine by the intravenous route.  Based on criteria approved by the Pharmacy and Therapeutics Committee, the intravenous medication(s) is/are being converted to the equivalent oral dose form(s).   DESCRIPTION: These criteria include:  The patient is eating (either orally or via tube) and/or has been taking other orally administered medications for a least 24 hours  The patient has no evidence of active gastrointestinal bleeding or impaired GI absorption (gastrectomy, short bowel, patient on TNA or NPO).  If you have questions about this conversion, please contact the Pharmacy Department  []   [phone] )  [facility] [x]   [phone] )  [facility] []   [phone] )  [facility] []   [phone] )  [facility] []   [phone] )  [place], [facility] 07/18/2017 11:23 AM

## 2017-07-18 NOTE — Progress Notes (Signed)
Inpatient Diabetes Program Recommendations  AACE/ADA: New Consensus Statement on Inpatient Glycemic Control (2015)  Target Ranges:  Prepandial:   less than 140 mg/dL      Peak postprandial:   less than 180 mg/dL (1-2 hours)      Critically ill patients:  140 - 180 mg/dL   Results for GARLEN, REINIG (MRN [id number]) as of 07/18/2017 10:34  Ref. Range 07/17/2017 10:26 07/17/2017 11:42 07/17/2017 12:45 07/17/2017 16:14 07/17/2017 22:23 07/18/2017 08:46  Glucose-Capillary Latest Ref Range: 65 - 99 mg/dL 76 63 (L) 85 86 91 133 (H)   Review of Glycemic Control  Diabetes history: DM2 Outpatient Diabetes medications: NPH 30 units BID, Regular 15 units with breakfast, Regular 10 units with supper Current orders for Inpatient glycemic control: Levemir 30 units BID, Novolog 15 units with breakfast, Novolog 10 units with supper  Inpatient Diabetes Program Recommendations: Correction (SSI): While inpatient, please consider ordering CBGs with Novolog 0-15 units TID with meals and Novolog 0-5 units QHS.  Thanks, Barnie Alderman, RN, MSN, CDE Diabetes Coordinator Inpatient Diabetes Program [phone] (Team Pager from 8am to 5pm)

## 2017-07-18 NOTE — Progress Notes (Signed)
[facility]  Daily Progress Note   Subjective  - 1 Day Post-Op  No events overnight. Tolerating diet, walked around, voided after catheter removed.  Objective Vitals:   07/18/17 0700 07/18/17 0800 07/18/17 0849 07/18/17 0900  BP: 135/66 131/77 138/79 133/81  Pulse: 76 76 81 79  Resp: 17 (!) 26  (!) 21  Temp:  97.7 F (36.5 C)    TempSrc:  Oral    SpO2: 97% 96%  92%  Weight:      Height:        Intake/Output Summary (Last 24 hours) at 07/18/17 1323 Last data filed at 07/18/17 0600  Gross per 24 hour  Intake          2717.71 ml  Output             2275 ml  Net           442.71 ml    PULM  CTAB CV  RRR VASC  Neck with mild swelling and bruising, neuro exam intact  Laboratory CBC    Component Value Date/Time   WBC 8.5 07/18/2017 0425   HGB 11.7 (L) 07/18/2017 0425   HGB 16.6 12/24/2016 1541   HCT 35.0 (L) 07/18/2017 0425   HCT 51.1 (H) 12/24/2016 1541   PLT 118 (L) 07/18/2017 0425   PLT 171 12/24/2016 1541    BMET    Component Value Date/Time   NA 139 07/18/2017 0425   NA 132 (L) 12/24/2016 1541   NA 140 12/10/2012 0726   K 5.4 (H) 07/18/2017 0425   K 4.6 12/10/2012 0726   CL 114 (H) 07/18/2017 0425   CL 111 (H) 12/10/2012 0726   CO2 16 (L) 07/18/2017 0425   CO2 20 (L) 12/10/2012 0726   GLUCOSE 108 (H) 07/18/2017 0425   GLUCOSE 181 (H) 12/10/2012 0726   BUN 45 (H) 07/18/2017 0425   BUN 50 (H) 12/24/2016 1541   BUN 33 (H) 09/01/2013 0951   CREATININE 2.83 (H) 07/18/2017 0425   CREATININE 2.27 (H) 09/01/2013 0951   CALCIUM 7.5 (L) 07/18/2017 0425   CALCIUM 8.1 (L) 12/10/2012 0726   GFRNONAA 20 (L) 07/18/2017 0425   GFRNONAA 28 (L) 09/01/2013 0951   GFRAA 23 (L) 07/18/2017 0425   GFRAA 32 (L) 09/01/2013 0951    Assessment/Planning: POD #1 s/p right CEA   Doing well  Discharge home  rtc 2 weeks for wound check and follow up    Leotis Pain  07/18/2017, 1:23 PM

## 2017-07-18 NOTE — Discharge Summary (Signed)
[facility]    Discharge Summary    Patient ID:  CHRISHUN SCHEER MRN: [id number] DOB/AGE: 1941-06-10 76 y.o.  Admit date: 07/17/2017 Discharge date: 07/18/2017 Date of Surgery: 07/17/2017 Surgeon: Surgeon(s): Lucky Cowboy Erskine Squibb, MD  Admission Diagnosis: CAROTID ARTERY STENOSIS  Discharge Diagnoses:  CAROTID ARTERY STENOSIS  Secondary Diagnoses: Past Medical History:  Diagnosis Date  . Blood transfusion without reported diagnosis   . Cancer [facility])    Colon  . Coronary artery disease   . Diabetes mellitus without complication ([place])   . Dysrhythmia   . Hyperlipidemia   . Hypertension   . Myocardial infarction ([place]) 1997  . Peripheral vascular disease ([place])   . Pneumonia 2014  . Stroke ([place]) 05/2017  . Thyroid disease     Procedure(s): ENDARTERECTOMY CAROTID  Discharged Condition: good  HPI:  Patient with recent stroke and significant right carotid stenosis.  Hospital Course:  ZIDAN HELGET is a 76 y.o. male is S/P Right Procedure(s): ENDARTERECTOMY CAROTID Extubated: POD # 0 Physical exam: neck with minimal swelling, neuro exam intact Post-op wounds clean, dry, intact or healing well Pt. Ambulating, voiding and taking PO diet without difficulty. Pt pain controlled with PO pain meds. Labs as below Complications:none  Consults:    Significant Diagnostic Studies: CBC Lab Results  Component Value Date   WBC 8.5 07/18/2017   HGB 11.7 (L) 07/18/2017   HCT 35.0 (L) 07/18/2017   MCV 92.7 07/18/2017   PLT 118 (L) 07/18/2017    BMET    Component Value Date/Time   NA 139 07/18/2017 0425   NA 132 (L) 12/24/2016 1541   NA 140 12/10/2012 0726   K 5.4 (H) 07/18/2017 0425   K 4.6 12/10/2012 0726   CL 114 (H) 07/18/2017 0425   CL 111 (H) 12/10/2012 0726   CO2 16 (L) 07/18/2017 0425   CO2 20 (L) 12/10/2012 0726   GLUCOSE 108 (H) 07/18/2017 0425   GLUCOSE 181 (H) 12/10/2012 0726   BUN 45 (H) 07/18/2017 0425   BUN 50 (H) 12/24/2016 1541    BUN 33 (H) 09/01/2013 0951   CREATININE 2.83 (H) 07/18/2017 0425   CREATININE 2.27 (H) 09/01/2013 0951   CALCIUM 7.5 (L) 07/18/2017 0425   CALCIUM 8.1 (L) 12/10/2012 0726   GFRNONAA 20 (L) 07/18/2017 0425   GFRNONAA 28 (L) 09/01/2013 0951   GFRAA 23 (L) 07/18/2017 0425   GFRAA 32 (L) 09/01/2013 0951   COAG Lab Results  Component Value Date   INR 1.13 07/10/2017   INR 1.07 06/11/2017   INR 1.0 09/01/2013     Disposition:  Discharge to :Home Discharge Instructions    Call MD for:  redness, tenderness, or signs of infection (pain, swelling, bleeding, redness, odor or green/yellow discharge around incision site)    Complete by:  As directed    Call MD for:  severe or increased pain, loss or decreased feeling  in affected limb(s)    Complete by:  As directed    Call MD for:  temperature >100.5    Complete by:  As directed    Driving Restrictions    Complete by:  As directed    No driving for one week   Lifting restrictions    Complete by:  As directed    No lifting for 48 hours   No dressing needed    Complete by:  As directed    Replace only if drainage present   Resume previous diet  Complete by:  As directed      Allergies as of 07/18/2017      Reactions   Ace Inhibitors Other (See Comments)   Reaction:  Raises potassium    Quinapril Rash   hyperkalemia      Medication List    TAKE these medications   aspirin 81 MG EC tablet Take 1 tablet (81 mg total) by mouth daily. What changed:  medication strength  how much to take   atenolol 50 MG tablet Commonly known as:  TENORMIN Take 1 tablet (50 mg total) by mouth daily.   atorvastatin 40 MG tablet Commonly known as:  LIPITOR Take 1 tablet (40 mg total) by mouth daily at 6 PM.   clopidogrel 75 MG tablet Commonly known as:  PLAVIX Take 1 tablet (75 mg total) by mouth daily.   insulin NPH Human 100 UNIT/ML injection Commonly known as:  HUMULIN N,NOVOLIN N Inject 30 Units into the skin 2 (two) times  daily.   insulin regular 100 units/mL injection Commonly known as:  NOVOLIN R,HUMULIN R Inject 10-15 Units into the skin 2 (two) times daily. TAKES 15 UNITS IN THE MORNING AND 10 UNITS WITH SUPPER   levothyroxine 300 MCG tablet Commonly known as:  SYNTHROID, LEVOTHROID Take 300 mcg by mouth daily before breakfast.   mupirocin cream 2 % Commonly known as:  BACTROBAN Apply 1 application topically 2 (two) times daily. What changed:  when to take this  reasons to take this   oxyCODONE-acetaminophen 5-325 MG tablet Commonly known as:  PERCOCET/ROXICET Take 1-2 tablets by mouth every 4 (four) hours as needed for moderate pain.   RELION PRIME MONITOR Devi   RELION PRIME TEST test strip Generic drug:  glucose blood   sodium bicarbonate 650 MG tablet Take 1 tablet by mouth 2 (two) times daily.      Verbal and written Discharge instructions given to the patient. Wound care per Discharge AVS Follow-up Information    Stegmayer, Janalyn Harder, PA-C Follow up in 2 week(s).   Specialty:  Physician Assistant Why:  for wound check Contact information: [place] [address] [phone]           Signed: Leotis Pain, MD  07/18/2017, 1:26 PM

## 2017-07-21 ENCOUNTER — Other Ambulatory Visit: Payer: Self-pay | Admitting: *Deleted

## 2017-07-21 LAB — SURGICAL PATHOLOGY

## 2017-07-21 NOTE — Patient Outreach (Signed)
[facility]) Care Management  [place]  07/21/2017   [name] 02/21/1941 [id number]  Transition of care call  Right Carotid Endarterectomy on 10/4,  Discharged home on 10/5   Subjective:  Placed call to patient, his wife answered phone, asked if she could speak on patient behalf , HIPPA information verified. She reports patient is a little worn out since surgery, a little sore at incision area, swelling is decreasing , denies increase redness, No fever, she reports he is having a little clear drainage from bottom of incision, reviewed discharge instruction to place small dressing if drainage noted.   She reports patient is tolerating diet, denies swallowing difficulties.    Wife reports patient is able to tolerate usual activity, ambulation in home, denies increase weakness noted,   Asked about blood sugar readings, wife heard asking patient , he reports having recent episode of blood sugar 56, improved symptoms after snack, unable to recall if rechecked blood sugar.  Wife reports patient continues to check his blood sugar and take his own medications daily. Wife reports patient tired now and does not feel like talking anymore.    Encounter Medications:  Outpatient Encounter Prescriptions as of 07/21/2017  Medication Sig Note  . aspirin EC 81 MG EC tablet Take 1 tablet (81 mg total) by mouth daily.   Marland Kitchen atenolol (TENORMIN) 50 MG tablet Take 1 tablet (50 mg total) by mouth daily.   Marland Kitchen atorvastatin (LIPITOR) 40 MG tablet Take 1 tablet (40 mg total) by mouth daily at 6 PM.   . Blood Glucose Monitoring Suppl ([place]) DEVI    . clopidogrel (PLAVIX) 75 MG tablet Take 1 tablet (75 mg total) by mouth daily.   Marland Kitchen glucose blood (RELION PRIME TEST) test strip    . insulin NPH Human (HUMULIN N,NOVOLIN N) 100 UNIT/ML injection Inject 30 Units into the skin 2 (two) times daily. 06/13/2017: Takes with breakfast and hs.  . insulin regular (NOVOLIN R,HUMULIN R)  100 units/mL injection Inject 10-15 Units into the skin 2 (two) times daily. TAKES 15 UNITS IN THE MORNING AND 10 UNITS WITH SUPPER 06/26/2017: 15 units in am, 10 units suppertime  . levothyroxine (SYNTHROID, LEVOTHROID) 300 MCG tablet Take 300 mcg by mouth daily before breakfast.   . oxyCODONE-acetaminophen (PERCOCET/ROXICET) 5-325 MG tablet Take 1-2 tablets by mouth every 4 (four) hours as needed for moderate pain.   . sodium bicarbonate 650 MG tablet Take 1 tablet by mouth 2 (two) times daily.   . mupirocin cream (BACTROBAN) 2 % Apply 1 application topically 2 (two) times daily. (Patient not taking: Reported on 07/21/2017)    No facility-administered encounter medications on file as of 07/21/2017.     Functional Status:  In your present state of health, do you have any difficulty performing the following activities: 07/17/2017 07/10/2017  Hearing? N N  Vision? N N  Difficulty concentrating or making decisions? N N  Walking or climbing stairs? N Y  Dressing or bathing? Y N  Doing errands, shopping? N -  Preparing Food and eating ? - -  Using the Toilet? - -  In the past six months, have you accidently leaked urine? - -  Do you have problems with loss of bowel control? - -  Managing your Medications? - -  Comment - -  Managing your Finances? - -  Housekeeping or managing your Housekeeping? - -  Comment - -  Some recent data might be hidden  Fall/Depression Screening: Fall Risk  06/20/2017 07/22/2016 03/19/2016  Falls in the past year? No No No  Risk for fall due to : Impaired balance/gait - -   PHQ 2/9 Scores 06/20/2017 07/22/2016 03/19/2016 03/16/2015  PHQ - 2 Score 0 0 0 0    Plan:  Will plan weekly outreach as part of transition of care program, next call in a week. Reinforced with patient/wife to notify MD sooner for concerns related to surgeon, reviewed signs of infection / restrictions.  Reinforced with patient wife to notify MD of increase episodes of hypoglycemia symptoms or  episodes, reviewed treatment of rule of 15 she verified understanding.    [name], RN, [place] Management Coordinator  [phone]- Mobile [phone]- Toll Free Main Office

## 2017-07-28 ENCOUNTER — Encounter: Payer: Self-pay | Admitting: Internal Medicine

## 2017-07-28 ENCOUNTER — Emergency Department: Payer: PPO

## 2017-07-28 ENCOUNTER — Other Ambulatory Visit: Payer: Self-pay | Admitting: *Deleted

## 2017-07-28 ENCOUNTER — Emergency Department
Admission: EM | Admit: 2017-07-28 | Discharge: 2017-07-28 | Disposition: A | Discharge status: Home / Self Care | Payer: PPO | Attending: Emergency Medicine | Admitting: Emergency Medicine

## 2017-07-28 ENCOUNTER — Telehealth (INDEPENDENT_AMBULATORY_CARE_PROVIDER_SITE_OTHER): Payer: Self-pay

## 2017-07-28 DIAGNOSIS — I129 Hypertensive chronic kidney disease with stage 1 through stage 4 chronic kidney disease, or unspecified chronic kidney disease: Secondary | ICD-10-CM | POA: Diagnosis not present

## 2017-07-28 DIAGNOSIS — M545 Low back pain: Secondary | ICD-10-CM | POA: Diagnosis not present

## 2017-07-28 DIAGNOSIS — S3992XA Unspecified injury of lower back, initial encounter: Secondary | ICD-10-CM | POA: Diagnosis present

## 2017-07-28 DIAGNOSIS — N184 Chronic kidney disease, stage 4 (severe): Secondary | ICD-10-CM | POA: Diagnosis not present

## 2017-07-28 DIAGNOSIS — Z7902 Long term (current) use of antithrombotics/antiplatelets: Secondary | ICD-10-CM | POA: Insufficient documentation

## 2017-07-28 DIAGNOSIS — Z8781 Personal history of (healed) traumatic fracture: Secondary | ICD-10-CM | POA: Insufficient documentation

## 2017-07-28 DIAGNOSIS — I2510 Atherosclerotic heart disease of native coronary artery without angina pectoris: Secondary | ICD-10-CM | POA: Diagnosis not present

## 2017-07-28 DIAGNOSIS — Y939 Activity, unspecified: Secondary | ICD-10-CM | POA: Insufficient documentation

## 2017-07-28 DIAGNOSIS — Y92002 Bathroom of unspecified non-institutional (private) residence single-family (private) house as the place of occurrence of the external cause: Secondary | ICD-10-CM | POA: Insufficient documentation

## 2017-07-28 DIAGNOSIS — E1143 Type 2 diabetes mellitus with diabetic autonomic (poly)neuropathy: Secondary | ICD-10-CM | POA: Diagnosis not present

## 2017-07-28 DIAGNOSIS — Y999 Unspecified external cause status: Secondary | ICD-10-CM | POA: Diagnosis not present

## 2017-07-28 DIAGNOSIS — Z87891 Personal history of nicotine dependence: Secondary | ICD-10-CM | POA: Insufficient documentation

## 2017-07-28 DIAGNOSIS — S22080A Wedge compression fracture of T11-T12 vertebra, initial encounter for closed fracture: Secondary | ICD-10-CM | POA: Insufficient documentation

## 2017-07-28 DIAGNOSIS — Z794 Long term (current) use of insulin: Secondary | ICD-10-CM | POA: Diagnosis not present

## 2017-07-28 DIAGNOSIS — W010XXA Fall on same level from slipping, tripping and stumbling without subsequent striking against object, initial encounter: Secondary | ICD-10-CM | POA: Diagnosis not present

## 2017-07-28 MED ORDER — KETOROLAC TROMETHAMINE 30 MG/ML IJ SOLN
30.0000 mg | Freq: Once | INTRAMUSCULAR | Status: AC
  Administered 2017-07-28: 30 mg via INTRAVENOUS
  Filled 2017-07-28: qty 1

## 2017-07-28 MED ORDER — OXYCODONE-ACETAMINOPHEN 7.5-325 MG PO TABS: 1.0000 | ORAL_TABLET | Freq: Four times a day (QID) | ORAL | 0 refills | Status: DC | PRN

## 2017-07-28 MED ORDER — HYDROMORPHONE HCL 1 MG/ML IJ SOLN
0.5000 mg | Freq: Once | INTRAMUSCULAR | Status: AC
  Administered 2017-07-28: 0.5 mg via INTRAMUSCULAR
  Filled 2017-07-28: qty 1

## 2017-07-28 NOTE — ED Triage Notes (Signed)
Pt c/o lower and mid back pain after fall in bathroom on Friday. Pt alert and oriented X4, active, cooperative, pt in NAD. RR even and unlabored, color WNL.  Uses cane at baseline.

## 2017-07-28 NOTE — ED Notes (Signed)
Pt discharged to home.  Family member driving.  Discharge instructions reviewed.  Verbalized understanding.  No questions or concerns at this time.  Teach back verified.  Pt in NAD.  No items left in ED.   

## 2017-07-28 NOTE — Patient Outreach (Signed)
[facility]) Care Management  07/28/2017  ANTONIUS HARTLAGE 05-10-1941 [id number]  Transition of care call  Spoke with patient he discussed visit to emergency room on today related to fall 3 days ago and back pain. Patient reports he has been using a cane at home now. Patient denies dizziness, or increased weakness.   Patient reports he has continued decrease in swelling at right neck incision from recent carotid endarterectomy, he reports he had MD at hospital look at incision on today and reports no sign of infection.  Patient's voice is still hoarse, raspy, reports no worse, he reports tolerating diet okay at home.  Reinforced with patient no driving if taking pain medication, he states his grandson will be able to drive him to MD visit on this week.   Plan Will plan home visit on this week.  Fall precautions reviewed.  Placed call to Dr.Dew office spoke with nurse Mickel Baas regarding voice, she reports this is usual after surgery.   [name], RN, [place] Management Coordinator  [phone]- Mobile [phone]- Toll Free Main Office

## 2017-07-28 NOTE — ED Provider Notes (Signed)
[facility] Emergency Department Provider Note   ____________________________________________   None    (approximate)  I have reviewed the triage vital signs and the nursing notes.   HISTORY  Chief Complaint Back Pain    HPI [name] is a 76 y.o. male patient complaining of mid and low back pain secondary to a fall 3 days ago. Patient stated fell backwards in his bathroom.patient state pain has increased since the fall. Patient stated pain is not controlled with Percocets. Patient denies radicular component to his back pain. Patient does has a history of diabetic peripheral neuropathy.patient rates his pain as 8/10. Patient described a pain as "achy". Past Medical History:  Diagnosis Date  . Blood transfusion without reported diagnosis   . Cancer [facility])    Colon  . Coronary artery disease   . Diabetes mellitus without complication ([place])   . Dysrhythmia   . Hyperlipidemia   . Hypertension   . Myocardial infarction ([place]) 1997  . Peripheral vascular disease ([place])   . Pneumonia 2014  . Stroke ([place]) 05/2017  . Thyroid disease     Patient Active Problem List   Diagnosis Date Noted  . Carotid stenosis, symptomatic, with infarction ([place]) 07/17/2017  . Cerebrovascular accident (CVA) ([place]) 06/11/2017  . Hyperglycemia 12/25/2016  . Chronic shoulder bursitis, left 07/22/2016  . Cholecystitis, acute 04/21/2016  . 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 CKD stage 4 due to type 2 diabetes mellitus ([place]) 03/16/2015  . Diabetes mellitus with polyneuropathy ([place]) 03/16/2015  . CAD in native artery 01/20/2015  . Gout 01/20/2015  . Diabetic peripheral neuropathy ([place]) 01/20/2015  . H/O malignant neoplasm of colon 01/20/2015  . Adult hypothyroidism 01/20/2015  . Hyperlipidemia associated with type 2 diabetes mellitus ([place])  01/20/2015  . Encounter for long-term (current) use of insulin ([place]) 07/27/2014  . Disorder affecting the body's metabolism 07/27/2014  . High potassium 05/19/2014  . 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 Hypertension in stage 4 chronic kidney disease due to type 2 diabetes mellitus ([place]) 07/15/2013    Past Surgical History:  Procedure Laterality Date  . APPENDECTOMY  2011  . BACK SURGERY    . CARDIAC CATHETERIZATION    . CAROTID ANGIOGRAPHY Right 06/13/2017   Procedure: Right subclavian and Carotid Angiography, possible intervention;  Surgeon: Algernon Huxley, MD;  Location: [facility];  Service: Cardiovascular;  Laterality: Right;  . CHOLECYSTECTOMY N/A 04/23/2016   Procedure: LAPAROSCOPIC CHOLECYSTECTOMY;  Surgeon: [name], MD;  Location: [facility] ORS;  Service: General;  Laterality: N/A;  . COLON SURGERY  2011   Colectomy for ileo-cecal valve cancer  . CORONARY ARTERY BYPASS GRAFT  1997   x 3  . ENDARTERECTOMY Right 07/17/2017   Procedure: ENDARTERECTOMY CAROTID;  Surgeon: Algernon Huxley, MD;  Location: [facility] ORS;  Service: Vascular;  Laterality: Right;  . HERNIA REPAIR  2011   Ventral hernia  . KNEE SURGERY Left   . PROSTATE SURGERY  2002   BPH benign pathology  . SPINE SURGERY  1989   Lumbar disc    Prior to Admission medications   Medication Sig Start Date End Date Taking? Authorizing Provider  aspirin EC 81 MG EC tablet Take 1 tablet (81 mg total) by mouth daily. 07/19/17   Algernon Huxley, MD  atenolol (TENORMIN) 50 MG  tablet Take 1 tablet (50 mg total) by mouth daily. 06/15/17   Vaughan Basta, MD  atorvastatin (LIPITOR) 40 MG tablet Take 1 tablet (40 mg total) by mouth daily at 6 PM. 06/14/17   Vaughan Basta, MD  Blood Glucose Monitoring Suppl ([place]) [place]  01/29/14   [provider]  clopidogrel (PLAVIX) 75 MG tablet Take 1 tablet (75 mg total) by mouth daily. 06/15/17   Vaughan Basta,  MD  glucose blood (RELION PRIME TEST) test strip  02/20/15   [provider]  insulin NPH Human (HUMULIN N,NOVOLIN N) 100 UNIT/ML injection Inject 30 Units into the skin 2 (two) times daily.    [provider]  insulin regular (NOVOLIN R,HUMULIN R) 100 units/mL injection Inject 10-15 Units into the skin 2 (two) times daily. TAKES 15 UNITS IN THE MORNING AND 10 UNITS WITH SUPPER    [provider]  levothyroxine (SYNTHROID, LEVOTHROID) 300 MCG tablet Take 300 mcg by mouth daily before breakfast.    [provider]  mupirocin cream (BACTROBAN) 2 % Apply 1 application topically 2 (two) times daily. Patient not taking: Reported on 07/21/2017 07/22/16   Glean Hess, MD  oxyCODONE-acetaminophen (PERCOCET) 7.5-325 MG tablet Take 1 tablet by mouth every 6 (six) hours as needed for severe pain. 07/28/17   Sable Feil, PA-C  oxyCODONE-acetaminophen (PERCOCET/ROXICET) 5-325 MG tablet Take 1-2 tablets by mouth every 4 (four) hours as needed for moderate pain. 07/18/17   Algernon Huxley, MD  sodium bicarbonate 650 MG tablet Take 1 tablet by mouth 2 (two) times daily. 04/28/17 04/28/18  [provider]    Allergies Ace inhibitors and Quinapril  Family History  Problem Relation Age of Onset  . Cancer Mother        Lung  . Diabetes Mother   . Heart disease Mother   . Heart disease Father     Social History Social History  Substance Use Topics  . Smoking status: Former Smoker    Types: Cigarettes    Quit date: 06/24/1996  . Smokeless tobacco: Never Used  . Alcohol use No    Review of Systems  Constitutional: No fever/chills Eyes: No visual changes. ENT: No sore throat. Cardiovascular: Denies chest pain. Respiratory: Denies shortness of breath. Gastrointestinal: No abdominal pain.  No nausea, no vomiting.  No diarrhea.  No constipation. Genitourinary: Negative for dysuria. Musculoskeletal: Negative for back pain. Skin: Negative for  rash. Neurological: Negative for headaches, focal weakness or numbness. Endocrine:diabetes, hyperlipidemia, hypothyroidism, and hypertension. Allergic/Immunilogical: Ace inhibitors ____________________________________________   PHYSICAL EXAM:  VITAL SIGNS: ED Triage Vitals  Enc Vitals Group     BP 07/28/17 1017 90/60     Pulse Rate 07/28/17 1017 (!) 51     Resp 07/28/17 1017 16     Temp 07/28/17 1021 (!) 97.5 F (36.4 C)     Temp Source 07/28/17 1021 Oral     SpO2 07/28/17 1017 94 %     Weight 07/28/17 1017 230 lb (104.3 kg)     Height 07/28/17 1017 6\' 2"  (1.88 m)     Head Circumference --      Peak Flow --      Pain Score 07/28/17 1020 8     Pain Loc --      Pain Edu? --      Excl. in [place]? --     Constitutional: Alert and oriented. Well appearing and in no acute distress. Eyes: Conjunctivae are normal. PERRL. EOMI. Head: Atraumatic.  Nose: No congestion/rhinnorhea. Mouth/Throat: Mucous membranes are moist.  Oropharynx non-erythematous. Neck: No stridor.  No cervical spine tenderness to palpation. Hematological/Lymphatic/Immunilogical: No cervical lymphadenopathy. Cardiovascular: Normal rate, regular rhythm. Grossly normal heart sounds.  Good peripheral circulation. Respiratory: Normal respiratory effort.  No retractions. Lungs CTAB. Gastrointestinal: Soft and nontender. No distention. No abdominal bruits. No CVA tenderness. Musculoskeletal: obvious spinal deformity. Moderate guarding palpation T10-L2. Neurologic:  Normal speech and language. No gross focal neurologic deficits are appreciated. No gait instability. Skin:  Skin is warm, dry and intact. No rash noted. Psychiatric: Mood and affect are normal. Speech and behavior are normal.  ____________________________________________   LABS (all labs ordered are listed, but only abnormal results are displayed)  Labs Reviewed - No data to  display ____________________________________________  EKG   ____________________________________________  RADIOLOGY  Ct Lumbar Spine Wo Contrast  Result Date: 07/28/2017 CLINICAL DATA:  Mid back pain and low back pain since a fall 3 days ago. EXAM: CT LUMBAR SPINE WITHOUT CONTRAST TECHNIQUE: Multidetector CT imaging of the lumbar spine was performed without intravenous contrast administration. Multiplanar CT image reconstructions were also generated. COMPARISON:  Radiographs dated 12/25/2016 and CT scan of the abdomen dated 04/20/2016 FINDINGS: Segmentation: 5 lumbar type vertebrae. Alignment: Normal. Vertebrae: Acute comminuted compression fracture of the T12 vertebral body. The fracture does not extend into the pedicles. No protrusion of bone into the spinal canal. Small fracture of a left anterolateral osteophyte on the inferior endplate of L1. Auto fusion of the L5-S1 vertebral bodies. Paraspinal and other soft tissues: Extensive aortic atherosclerosis. Disc levels: T10-11:  Negative. T11-12: Acute compression fracture of the superior endplate of O96 with approximately 20% loss of anterior vertebral body height. The fracture does involve the posterior margin of the vertebral body but there is no significant protrusion of bone into the spinal canal. T11-12 disc appears normal. Minimal paraspinal soft tissue stranding consistent with hemorrhage. T12-L1: The fracture of T12 does involve the inferior endplate. The disc appears normal. L1-2:  Tiny broad-based disc bulge.  No acute abnormality. L2-3: Tiny broad-based disc bulge with no neural impingement. Minimal degenerative changes of the facet joints. L3-4: Tiny broad-based disc bulge with no neural impingement. Minimal degenerative changes of the facet joints. Tiny Schmorl's node in the superior endplate of L4. L4-5: Small broad-based disc bulge without neural impingement. Slight bilateral facet arthritis, right greater than left. L5-S1: Auto fusion  of the L5 and S1 vertebra. No neural impingement. IMPRESSION: 1. Acute mild benign-appearing compression fracture of T12 without neural impingement. 2. Small avulsion fracture of an osteophyte at the anterior margin of the inferior endplate of L1. Electronically Signed   By: Lorriane Shire M.D.   On: 07/28/2017 11:56    ____________________________________________   PROCEDURES  Procedure(s) performed: None  Procedures  Critical Care performed: No  ____________________________________________   INITIAL IMPRESSION / ASSESSMENT AND PLAN / ED COURSE  As part of my medical decision making, I reviewed the following data within the electronic MEDICAL RECORD NUMBER    Mid back pain secondary to compression fracture of T12. Discuss CT findings with patient. Patient continues to to have negative neurological component to this complaint. Patient advised to follow orthopedics and given discharge care instructions.      ____________________________________________   FINAL CLINICAL IMPRESSION(S) / ED DIAGNOSES  Final diagnoses:  Compression fracture of T12 vertebra ([place])      NEW MEDICATIONS STARTED DURING THIS VISIT:  New Prescriptions   OXYCODONE-ACETAMINOPHEN (PERCOCET) 7.5-325 MG TABLET    Take 1  tablet by mouth every 6 (six) hours as needed for severe pain.     Note:  This document was prepared using Dragon voice recognition software and may include unintentional dictation errors.    Sable Feil, PA-C 07/28/17 1215    Lavonia Drafts, MD 07/28/17 [phone]

## 2017-07-28 NOTE — Telephone Encounter (Signed)
Patient's nurse called to let us know his voice sounded scratchy and that the patient went to the ED because of a fall and his incision was looked at and their isn't any infection or redness and he looks good. She was concerned regarding his voice.

## 2017-07-31 ENCOUNTER — Encounter: Payer: Self-pay | Admitting: *Deleted

## 2017-07-31 ENCOUNTER — Other Ambulatory Visit: Payer: Self-pay | Admitting: *Deleted

## 2017-07-31 NOTE — Patient Outreach (Signed)
[facility]) Care Management   07/31/2017  [name] 07-22-1941 [id number]  [name] is an 76 y.o. male  Subjective:  "Didn't sleep well last night, so I slept later today".  Discussed recent fall during the night in the bathroom, while power was out'. Discussed he was doing well after surgery until this happened.   Objective:  BP 132/74 (BP Location: Left Arm, Patient Position: Sitting, Cuff Size: Large)   Pulse 63   Resp 18   SpO2 98%   Noted using rolling walker during visit .  Review of Systems  Constitutional: Negative.   HENT: Negative.        Hoarse voice   Eyes: Negative.   Respiratory: Negative.   Gastrointestinal: Positive for heartburn.  Genitourinary: Negative.   Musculoskeletal: Positive for back pain and falls.  Skin: Negative.   Neurological: Negative for dizziness.  Psychiatric/Behavioral: Negative.     Physical Exam  Constitutional: He is oriented to person, place, and time. He appears well-developed and well-nourished.  Cardiovascular: Normal rate and normal heart sounds.   Respiratory: Effort normal and breath sounds normal.  GI: Soft. Bowel sounds are normal.  Neurological: He is alert and oriented to person, place, and time.  Skin: Skin is warm and dry.     Psychiatric: He has a normal mood and affect. His behavior is normal. Judgment and thought content normal.    Encounter Medications:   Outpatient Encounter Prescriptions as of 07/31/2017  Medication Sig Note  . aspirin EC 81 MG EC tablet Take 1 tablet (81 mg total) by mouth daily.   Marland Kitchen atenolol (TENORMIN) 50 MG tablet Take 1 tablet (50 mg total) by mouth daily.   Marland Kitchen atorvastatin (LIPITOR) 40 MG tablet Take 1 tablet (40 mg total) by mouth daily at 6 PM.   . Blood Glucose Monitoring Suppl ([place]) [place]    . clopidogrel (PLAVIX) 75 MG tablet Take 1 tablet (75 mg total) by mouth daily.   Marland Kitchen glucose blood (RELION PRIME TEST) test strip    . insulin NPH  Human (HUMULIN N,NOVOLIN N) 100 UNIT/ML injection Inject 30 Units into the skin 2 (two) times daily. 06/13/2017: Takes with breakfast and hs.  . insulin regular (NOVOLIN R,HUMULIN R) 100 units/mL injection Inject 10-15 Units into the skin 2 (two) times daily. TAKES 15 UNITS IN THE MORNING AND 10 UNITS WITH SUPPER 06/26/2017: 15 units in am, 10 units suppertime  . levothyroxine (SYNTHROID, LEVOTHROID) 300 MCG tablet Take 300 mcg by mouth daily before breakfast.   . oxyCODONE-acetaminophen (PERCOCET/ROXICET) 5-325 MG tablet Take 1-2 tablets by mouth every 4 (four) hours as needed for moderate pain.   . sodium bicarbonate 650 MG tablet Take 1 tablet by mouth 2 (two) times daily.   . mupirocin cream (BACTROBAN) 2 % Apply 1 application topically 2 (two) times daily. (Patient not taking: Reported on 07/21/2017)   . oxyCODONE-acetaminophen (PERCOCET) 7.5-325 MG tablet Take 1 tablet by mouth every 6 (six) hours as needed for severe pain. (Patient not taking: Reported on 07/31/2017)    No facility-administered encounter medications on file as of 07/31/2017.   Patient was recently discharged from hospital and all medications have been reviewed.  Functional Status:   In your present state of health, do you have any difficulty performing the following activities: 07/31/2017 07/17/2017  Hearing? N N  Vision? N N  Difficulty concentrating or making decisions? N N  Walking or climbing stairs? Y N  Dressing or bathing?  [name]  Comment wife helps -  Doing errands, shopping? N N  Preparing Food and eating ? Y -  Comment wife helps -  Using the Toilet? N -  In the past six months, have you accidently leaked urine? N -  Do you have problems with loss of bowel control? N -  Managing your Medications? N -  Comment - -  Managing your Finances? N -  Housekeeping or managing your Housekeeping? Y -  Comment wife helps -  Some recent data might be hidden    Fall/Depression Screening:    Fall Risk  07/31/2017  06/20/2017 07/22/2016  Falls in the past year? Yes No No  Number falls in past yr: 1 - -  Injury with Fall? Yes - -  Risk Factor Category  High Fall Risk - -  Risk for fall due to : History of fall(s);Impaired balance/gait;Impaired mobility Impaired balance/gait -  Follow up Falls evaluation completed;Falls prevention discussed;Education provided - -   PHQ 2/9 Scores 07/31/2017 06/20/2017 07/22/2016 03/19/2016 03/16/2015  PHQ - 2 Score 0 0 0 0 0    Assessment:  Transition of care visit.  Recent Carotid surgery - voices hoarse but clearing some per patient, incision without redness .  Denies difficulty swallowing, some decrease in appetite.  Recent Fall/High Fall risk - ambulating with walker in home, family assisting with transportation , report relief with prn perocet 5-325 prescribed after surgery, has not filled new prescription for percocet  7.5/325 after ED admission. Needs to schedule follow up visit with Dr.Hooten.Will benefit from bedside commode to use, patient has a very low commode.   Diabetes Blood sugar this am 183, one recent episode of hypoglycemia, resolved after treatment. Reports checking daily has all supplies.     Plan:  Will continue weekly transition of care outreaches, next call in a week.  1. Reinforced keeping post surgery  appointment  2. Reviewed fall prevention measures, will make follow up appointment with Dr.Hooten. Placed call to PCP office to request order for bedside commode, patient/wife did not have preference of agency.  3. Reinforced rule of 15 for hypoglycemia   Will send PCP visit note.    [facility] CM Care Plan Problem One     Most Recent Value  Care Plan Problem One  Patient with recent hospital admission related to Right Carotid Endarterectomy   Role Documenting the Problem One  Care Management [place] for Problem One  Active  [facility] Long Term Goal   Patient will not experience a hospital admission in the next 31 days   [facility] Long Term Goal  Start Date  07/21/17  Interventions for Problem One Long Term Goal  completed home visit   [facility] CM Short Term Goal #1   Patient will attend all medical appointments in the next 30 days   [facility] CM Short Term Goal #1 Start Date  07/21/17  Interventions for Short Term Goal #1  Reinforced attending post surgical visit and scheduling orthopedic follow up   [facility] CM Short Term Goal #2   Patient will be able to report symptoms of infection at incision area in the next 30 days   [facility] CM Short Term Goal #2 Start Date  07/21/17  [facility] CM Short Term Goal #2 Met Date  07/31/17  [facility] CM Short Term Goal #3  Patient will report no falls in the next 30 days   [facility] CM Short Term Goal #3 Start Date  07/31/17  Interventions for  Short Tern Goal #3  Advised regarding fall prevention measures, reviewed fall checklist from [facility] packet , wil notify PCP office regarding getting bedside commode        [name], RN, [place] Management Coordinator  [phone]- Mobile [phone]- [place]

## 2017-08-01 ENCOUNTER — Telehealth (INDEPENDENT_AMBULATORY_CARE_PROVIDER_SITE_OTHER): Payer: Self-pay | Admitting: Vascular Surgery

## 2017-08-01 ENCOUNTER — Ambulatory Visit (INDEPENDENT_AMBULATORY_CARE_PROVIDER_SITE_OTHER): Payer: PPO | Admitting: Vascular Surgery

## 2017-08-01 ENCOUNTER — Encounter (INDEPENDENT_AMBULATORY_CARE_PROVIDER_SITE_OTHER): Payer: Self-pay | Admitting: Vascular Surgery

## 2017-08-01 VITALS — BP 77/50 | HR 77 | Resp 16 | Ht 74.0 in | Wt 230.0 lb

## 2017-08-01 DIAGNOSIS — I6523 Occlusion and stenosis of bilateral carotid arteries: Secondary | ICD-10-CM

## 2017-08-01 DIAGNOSIS — R490 Dysphonia: Secondary | ICD-10-CM

## 2017-08-01 NOTE — Telephone Encounter (Signed)
Spoke with wife. Patient has not had any additional urine yet. If any blood - wife will call office or if after hours page doctor on call.

## 2017-08-01 NOTE — Progress Notes (Signed)
Subjective:    Patient ID: [name], male    DOB: 12/12/1940, 76 y.o.   MRN: [id number] Chief Complaint  Patient presents with  . Wound Check   Patient presents for his first postoperative follow-up. Patient is s/p a right carotid endarterectomy on 07/17/2017. Patient presents today without complaint with the exception of "hoarseness". Patient states this hoarseness has been present since surgery. He denies any shortness of breath or dysphasia. Patient sustained a thoracic compression fracture after a fall last week. Possible blood in urine? Wife endorses possibly seeing some blood in the patient's urine earlier in the day. Patient denies any dark/bright red blood in his stool, chest pain or dizziness. No issues with his incision. The patient denies experiencing Amaurosis Fugax, TIA like symptoms or focal motor deficits.    Review of Systems  Constitutional: Negative.   HENT: Positive for voice change.   Eyes: Negative.   Respiratory: Negative.   Cardiovascular: Negative.   Gastrointestinal: Negative.   Endocrine: Negative.   Genitourinary: Negative.   Musculoskeletal: Negative.   Skin: Negative.   Allergic/Immunologic: Negative.   Neurological: Negative.   Hematological: Negative.   Psychiatric/Behavioral: Negative.       Objective:   Physical Exam  Constitutional: He is oriented to person, place, and time. He appears well-developed and well-nourished. No distress.  HENT:  Head: Normocephalic and atraumatic.  Eyes: Pupils are equal, round, and reactive to light. Conjunctivae are normal.  Neck: Normal range of motion.  Right carotid endarterectomy site: incision is healing well. No bruits noted on auscultation.  Cardiovascular: Normal rate, regular rhythm, normal heart sounds and intact distal pulses.   Pulses:      Radial pulses are 2+ on the right side, and 2+ on the left side.  Pulmonary/Chest: Effort normal. No respiratory distress. He has no wheezes. He has no  rales.  Musculoskeletal: Normal range of motion. He exhibits no edema.  Neurological: He is alert and oriented to person, place, and time.  Skin: Skin is warm and dry. He is not diaphoretic.  Psychiatric: He has a normal mood and affect. His behavior is normal. Judgment and thought content normal.  Vitals reviewed.  BP (!) 77/50 (BP Location: Right Arm)   Pulse 77   Resp 16   Ht 6\' 2"  (1.88 m)   Wt 230 lb (104.3 kg)   BMI 29.53 kg/m   Past Medical History:  Diagnosis Date  . Blood transfusion without reported diagnosis   . 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 Coronary artery disease   . Diabetes mellitus without complication (HCC)   . Dysrhythmia   . Hyperlipidemia   . Hypertension   . Myocardial infarction (HCC) 1997  . Peripheral vascular disease (HCC)   . Pneumonia 2014  . Stroke (HCC) 05/2017  . Thyroid disease    Social History   Social History  . Marital status: Married    Spouse name: N/A  . Number of children: N/A  . Years of education: N/A   Occupational History  . Not on file.   Social History Main Topics  . Smoking status: Former Smoker    Types: Cigarettes    Quit date: 06/24/1996  . Smokeless tobacco: Never Used  . Alcohol use No  . Drug use: No  . Sexual activity: Not on file   Other Topics Concern  . Not on file   Social History Narrative  . No narrative on file   Past Surgical History:  Procedure  Laterality Date  . APPENDECTOMY  2011  . BACK SURGERY    . CARDIAC CATHETERIZATION    . CAROTID ANGIOGRAPHY Right 06/13/2017   Procedure: Right subclavian and Carotid Angiography, possible intervention;  Surgeon: Annice Needy, MD;  Location: [facility] INVASIVE CV LAB;  Service: Cardiovascular;  Laterality: Right;  . CHOLECYSTECTOMY N/A 04/23/2016   Procedure: LAPAROSCOPIC CHOLECYSTECTOMY;  Surgeon: Tiney Rouge III, MD;  Location: [facility] ORS;  Service: General;  Laterality: N/A;  . COLON SURGERY  2011   Colectomy for ileo-cecal valve cancer  . CORONARY ARTERY BYPASS  GRAFT  1997   x 3  . ENDARTERECTOMY Right 07/17/2017   Procedure: ENDARTERECTOMY CAROTID;  Surgeon: Annice Needy, MD;  Location: [facility] ORS;  Service: Vascular;  Laterality: Right;  . HERNIA REPAIR  2011   Ventral hernia  . KNEE SURGERY Left   . PROSTATE SURGERY  2002   BPH benign pathology  . SPINE SURGERY  1989   Lumbar disc   Family History  Problem Relation Age of Onset  . Cancer Mother        Lung  . Diabetes Mother   . Heart disease Mother   . Heart disease Father    Allergies  Allergen Reactions  . Ace Inhibitors Other (See Comments)    Reaction:  Raises potassium   . Quinapril Rash    hyperkalemia      Assessment & Plan:  Patient presents for his first postoperative follow-up. Patient is s/p a right carotid endarterectomy on 07/17/2017. Patient presents today without complaint with the exception of "hoarseness". Patient states this hoarseness has been present since surgery. He denies any shortness of breath or dysphasia. Patient sustained a thoracic compression fracture after a fall last week. Possible blood in urine? Wife endorses possibly seeing some blood in the patient's urine earlier in the day. Patient denies any dark/bright red blood in his stool, chest pain or dizziness. No issues with his incision. The patient denies experiencing Amaurosis Fugax, TIA like symptoms or focal motor deficits.   1. Bilateral carotid artery stenosis s/p right carotid endarterectomy on 07/17/2017 - new Patient presents for his first postoperative follow-up Patient complaining of persistent hoarseness since surgery. He denies any shortness of breath or dysphasia. ? Possible blood in urine - if patient does have another episode of hematuria will stop aspirin and Plavix. Patient denies any tachycardia, chest pain, dizziness Physical exam is unremarkable. Incision is healing well. Patient to follow up in 1 month for carotid duplex. Patient was instructed to contact our office in the interim  with problems such as arm / leg weakness or numbness, speech / swallowing difficulty or temporary monocular blindness. The patient expresses their understanding  - VAS US CAROTID; Future  2. Hoarseness of voice - New Patient with new onset of hoarseness since surgery Patient is status post a right carotid endarterectomy on 07/17/2017 Worrisome for possible vocal cord paralysis Will refer to ENT for evaluation and possible treatment if appropriate Patient and his wife are in agreement.  Current Outpatient Prescriptions on File Prior to Visit  Medication Sig Dispense Refill  . aspirin EC 81 MG EC tablet Take 1 tablet (81 mg total) by mouth daily. 90 tablet 3  . atenolol (TENORMIN) 50 MG tablet Take 1 tablet (50 mg total) by mouth daily. 30 tablet 0  . atorvastatin (LIPITOR) 40 MG tablet Take 1 tablet (40 mg total) by mouth daily at 6 PM. 30 tablet 0  . Blood Glucose Monitoring Suppl (RELION  PRIME MONITOR) DEVI     . clopidogrel (PLAVIX) 75 MG tablet Take 1 tablet (75 mg total) by mouth daily. 30 tablet 0  . glucose blood (RELION PRIME TEST) test strip     . insulin NPH Human (HUMULIN N,NOVOLIN N) 100 UNIT/ML injection Inject 30 Units into the skin 2 (two) times daily.    . insulin regular (NOVOLIN R,HUMULIN R) 100 units/mL injection Inject 10-15 Units into the skin 2 (two) times daily. TAKES 15 UNITS IN THE MORNING AND 10 UNITS WITH SUPPER    . levothyroxine (SYNTHROID, LEVOTHROID) 300 MCG tablet Take 300 mcg by mouth daily before breakfast.    . oxyCODONE-acetaminophen (PERCOCET/ROXICET) 5-325 MG tablet Take 1-2 tablets by mouth every 4 (four) hours as needed for moderate pain. 30 tablet 0  . sodium bicarbonate 650 MG tablet Take 1 tablet by mouth 2 (two) times daily.    . mupirocin cream (BACTROBAN) 2 % Apply 1 application topically 2 (two) times daily. (Patient not taking: Reported on 07/21/2017) 30 g 1  . oxyCODONE-acetaminophen (PERCOCET) 7.5-325 MG tablet Take 1 tablet by mouth every 6  (six) hours as needed for severe pain. (Patient not taking: Reported on 07/31/2017) 12 tablet 0   No current facility-administered medications on file prior to visit.    There are no Patient Instructions on file for this visit. No Follow-up on file.  Shaquan Puerta A Monike Bragdon, PA-C

## 2017-08-04 ENCOUNTER — Telehealth (INDEPENDENT_AMBULATORY_CARE_PROVIDER_SITE_OTHER): Payer: Self-pay | Admitting: Vascular Surgery

## 2017-08-04 ENCOUNTER — Other Ambulatory Visit: Payer: Self-pay | Admitting: *Deleted

## 2017-08-04 NOTE — Telephone Encounter (Signed)
Patient needs to stop ASA and Plavix. Patient should see PCP ASAP to have labs drawn to make sure he is not anemic. Please assure patient is not SOB or experiencing chest pain. Thank you.

## 2017-08-04 NOTE — Patient Outreach (Signed)
[facility]) Care Management  08/04/2017  GILMORE LIST 06/20/1941 [id number]   Late Entry for 10/19 Incoming call from [place] office to follow up on request for Bedside commode for patient, per office representative they suggest patient ask for prescription for [facility] on visit to orthopedic MD at appointment on 10/24.  10/22 Placed call to patient, spoke with his wife to inform her to ask for prescription for [facility] at office with Dr.Hooten on this week.  Wife discussed patient new problem of seeing blood in urine, and vascular doctor has stopped Aspirin and Plavix and requested patient make appointment with PCP as ASAP. She verbalized understanding and is calling office to make an appointment .   Plan Will follow up with patient within the week, for transition of care follow up.  [name], RN, [place] Management Coordinator  [phone]- Mobile [phone]- Toll Free Main Office

## 2017-08-04 NOTE — Telephone Encounter (Signed)
JANET CALLED AND SAID THAT [name] HAS BLOOD IN HIS URINE AND WANTS KIM TO KNOW, SAID KIM TOLD THEM TO CALL IF HE HAS URINE IN HIS BLOOD. WANTING TO KNOW WHAT TO DO?

## 2017-08-04 NOTE — Telephone Encounter (Signed)
I called and let Patients wife know

## 2017-08-06 DIAGNOSIS — W19XXXA Unspecified fall, initial encounter: Secondary | ICD-10-CM | POA: Diagnosis not present

## 2017-08-06 DIAGNOSIS — S22080A Wedge compression fracture of T11-T12 vertebra, initial encounter for closed fracture: Secondary | ICD-10-CM | POA: Diagnosis not present

## 2017-08-07 ENCOUNTER — Other Ambulatory Visit: Payer: Self-pay | Admitting: *Deleted

## 2017-08-07 ENCOUNTER — Ambulatory Visit
Admission: RE | Admit: 2017-08-07 | Discharge: 2017-08-07 | Disposition: A | Discharge status: Home / Self Care | Payer: PPO | Source: Ambulatory Visit | Attending: Orthopedic Surgery | Admitting: Orthopedic Surgery

## 2017-08-07 ENCOUNTER — Other Ambulatory Visit: Payer: Self-pay | Admitting: Orthopedic Surgery

## 2017-08-07 DIAGNOSIS — S22088D Other fracture of T11-T12 vertebra, subsequent encounter for fracture with routine healing: Secondary | ICD-10-CM | POA: Insufficient documentation

## 2017-08-07 DIAGNOSIS — R29890 Loss of height: Secondary | ICD-10-CM | POA: Insufficient documentation

## 2017-08-07 DIAGNOSIS — S22009K Unspecified fracture of unspecified thoracic vertebra, subsequent encounter for fracture with nonunion: Secondary | ICD-10-CM

## 2017-08-07 DIAGNOSIS — S22080A Wedge compression fracture of T11-T12 vertebra, initial encounter for closed fracture: Secondary | ICD-10-CM

## 2017-08-07 DIAGNOSIS — S22009D Unspecified fracture of unspecified thoracic vertebra, subsequent encounter for fracture with routine healing: Secondary | ICD-10-CM | POA: Diagnosis present

## 2017-08-07 DIAGNOSIS — M47896 Other spondylosis, lumbar region: Secondary | ICD-10-CM | POA: Insufficient documentation

## 2017-08-07 DIAGNOSIS — X58XXXD Exposure to other specified factors, subsequent encounter: Secondary | ICD-10-CM | POA: Diagnosis not present

## 2017-08-07 DIAGNOSIS — M545 Low back pain: Secondary | ICD-10-CM | POA: Diagnosis not present

## 2017-08-07 NOTE — Patient Outreach (Signed)
[facility]) Care Management  08/07/2017  AMIAS HUTCHINSON [birth year] [id number]   Transition of care call  Placed call spoke with patient wife, she states patient says this has been a rough week.  She discussed visit with orthopedics to evaluate back pain from recent fall, she discussed patient had MRI on today, and she is awaiting call regarding when back surgery will be scheduled. Wife reports with everything going on she forgot to ask MD about prescription for bedside commode.   Wife discussed patient's urine is clear now no blood noted, she states patient remains off Plavix and aspirin. She reports earliest appointment she could get with Dr.Berguland was 10/29.   Wife discussed patient continues with hoarseness, and plans to be  evaluated by special doctor awaiting referral appointment. She voiced being upset regarding all the medical appointments, discussed cost of co payment for visits and test. Wife reports family is assisting with transportation to visits.   Wife reports patient blood sugars are running well, unable to give a number, denies low blood sugar symptoms.    Plan  Will follow up with patient in the next week, will place call to [place] office regarding prescription for bedside commode with office visit on 10/29. Reinforced with caregiver to notify MD sooner with new or worsen of symptoms of pain, any noted bleeding,dizziness, shortness of breath and 911 for emergency.  Will continue to evaluate for resource needs.    [name], RN, [place] Management Coordinator  [phone]- Mobile [phone]- Toll Free Main Office

## 2017-08-08 ENCOUNTER — Other Ambulatory Visit: Payer: Self-pay | Admitting: Internal Medicine

## 2017-08-08 ENCOUNTER — Other Ambulatory Visit: Payer: Self-pay | Admitting: Orthopedic Surgery

## 2017-08-08 DIAGNOSIS — I2510 Atherosclerotic heart disease of native coronary artery without angina pectoris: Secondary | ICD-10-CM

## 2017-08-08 DIAGNOSIS — S22080S Wedge compression fracture of T11-T12 vertebra, sequela: Secondary | ICD-10-CM

## 2017-08-08 MED ORDER — CLOPIDOGREL BISULFATE 75 MG PO TABS: 75.0000 mg | ORAL_TABLET | Freq: Every day | ORAL | 5 refills | Status: DC

## 2017-08-08 MED ORDER — ATENOLOL 50 MG PO TABS: 50.0000 mg | ORAL_TABLET | Freq: Every day | ORAL | 5 refills | Status: DC

## 2017-08-10 ENCOUNTER — Emergency Department: Payer: PPO

## 2017-08-10 ENCOUNTER — Inpatient Hospital Stay
Admission: EM | Admit: 2017-08-10 | Discharge: 2017-08-15 | DRG: 982 | Disposition: A | Discharge status: Home / Self Care | Payer: PPO | Attending: Internal Medicine | Admitting: Internal Medicine

## 2017-08-10 ENCOUNTER — Encounter: Payer: Self-pay | Admitting: Emergency Medicine

## 2017-08-10 DIAGNOSIS — E1122 Type 2 diabetes mellitus with diabetic chronic kidney disease: Secondary | ICD-10-CM | POA: Diagnosis present

## 2017-08-10 DIAGNOSIS — N2889 Other specified disorders of kidney and ureter: Secondary | ICD-10-CM | POA: Diagnosis present

## 2017-08-10 DIAGNOSIS — E785 Hyperlipidemia, unspecified: Secondary | ICD-10-CM | POA: Diagnosis present

## 2017-08-10 DIAGNOSIS — N179 Acute kidney failure, unspecified: Secondary | ICD-10-CM

## 2017-08-10 DIAGNOSIS — Z87891 Personal history of nicotine dependence: Secondary | ICD-10-CM | POA: Diagnosis not present

## 2017-08-10 DIAGNOSIS — K219 Gastro-esophageal reflux disease without esophagitis: Secondary | ICD-10-CM | POA: Diagnosis present

## 2017-08-10 DIAGNOSIS — I499 Cardiac arrhythmia, unspecified: Secondary | ICD-10-CM | POA: Diagnosis not present

## 2017-08-10 DIAGNOSIS — R531 Weakness: Secondary | ICD-10-CM | POA: Diagnosis present

## 2017-08-10 DIAGNOSIS — N184 Chronic kidney disease, stage 4 (severe): Secondary | ICD-10-CM | POA: Diagnosis present

## 2017-08-10 DIAGNOSIS — I10 Essential (primary) hypertension: Secondary | ICD-10-CM | POA: Diagnosis not present

## 2017-08-10 DIAGNOSIS — Z419 Encounter for procedure for purposes other than remedying health state, unspecified: Secondary | ICD-10-CM

## 2017-08-10 DIAGNOSIS — N401 Enlarged prostate with lower urinary tract symptoms: Secondary | ICD-10-CM | POA: Diagnosis present

## 2017-08-10 DIAGNOSIS — E860 Dehydration: Secondary | ICD-10-CM | POA: Diagnosis present

## 2017-08-10 DIAGNOSIS — Z7982 Long term (current) use of aspirin: Secondary | ICD-10-CM

## 2017-08-10 DIAGNOSIS — I129 Hypertensive chronic kidney disease with stage 1 through stage 4 chronic kidney disease, or unspecified chronic kidney disease: Secondary | ICD-10-CM | POA: Diagnosis present

## 2017-08-10 DIAGNOSIS — E1165 Type 2 diabetes mellitus with hyperglycemia: Secondary | ICD-10-CM | POA: Diagnosis present

## 2017-08-10 DIAGNOSIS — N390 Urinary tract infection, site not specified: Secondary | ICD-10-CM | POA: Diagnosis present

## 2017-08-10 DIAGNOSIS — I252 Old myocardial infarction: Secondary | ICD-10-CM

## 2017-08-10 DIAGNOSIS — Z79899 Other long term (current) drug therapy: Secondary | ICD-10-CM

## 2017-08-10 DIAGNOSIS — R1900 Intra-abdominal and pelvic swelling, mass and lump, unspecified site: Secondary | ICD-10-CM | POA: Diagnosis present

## 2017-08-10 DIAGNOSIS — R1310 Dysphagia, unspecified: Secondary | ICD-10-CM | POA: Diagnosis present

## 2017-08-10 DIAGNOSIS — I2510 Atherosclerotic heart disease of native coronary artery without angina pectoris: Secondary | ICD-10-CM | POA: Diagnosis present

## 2017-08-10 DIAGNOSIS — E1151 Type 2 diabetes mellitus with diabetic peripheral angiopathy without gangrene: Secondary | ICD-10-CM | POA: Diagnosis present

## 2017-08-10 DIAGNOSIS — M6281 Muscle weakness (generalized): Secondary | ICD-10-CM | POA: Diagnosis not present

## 2017-08-10 DIAGNOSIS — Z951 Presence of aortocoronary bypass graft: Secondary | ICD-10-CM

## 2017-08-10 DIAGNOSIS — Z85038 Personal history of other malignant neoplasm of large intestine: Secondary | ICD-10-CM

## 2017-08-10 DIAGNOSIS — Z981 Arthrodesis status: Secondary | ICD-10-CM | POA: Diagnosis not present

## 2017-08-10 DIAGNOSIS — W010XXA Fall on same level from slipping, tripping and stumbling without subsequent striking against object, initial encounter: Secondary | ICD-10-CM | POA: Diagnosis present

## 2017-08-10 DIAGNOSIS — E039 Hypothyroidism, unspecified: Secondary | ICD-10-CM | POA: Diagnosis present

## 2017-08-10 DIAGNOSIS — R338 Other retention of urine: Secondary | ICD-10-CM | POA: Diagnosis not present

## 2017-08-10 DIAGNOSIS — Z66 Do not resuscitate: Secondary | ICD-10-CM | POA: Diagnosis present

## 2017-08-10 DIAGNOSIS — I739 Peripheral vascular disease, unspecified: Secondary | ICD-10-CM | POA: Diagnosis not present

## 2017-08-10 DIAGNOSIS — R339 Retention of urine, unspecified: Secondary | ICD-10-CM | POA: Diagnosis not present

## 2017-08-10 DIAGNOSIS — J690 Pneumonitis due to inhalation of food and vomit: Secondary | ICD-10-CM | POA: Diagnosis present

## 2017-08-10 DIAGNOSIS — Z8673 Personal history of transient ischemic attack (TIA), and cerebral infarction without residual deficits: Secondary | ICD-10-CM | POA: Diagnosis not present

## 2017-08-10 DIAGNOSIS — R29890 Loss of height: Secondary | ICD-10-CM | POA: Diagnosis present

## 2017-08-10 DIAGNOSIS — R0602 Shortness of breath: Secondary | ICD-10-CM | POA: Diagnosis not present

## 2017-08-10 DIAGNOSIS — J189 Pneumonia, unspecified organism: Secondary | ICD-10-CM | POA: Diagnosis not present

## 2017-08-10 DIAGNOSIS — Z794 Long term (current) use of insulin: Secondary | ICD-10-CM | POA: Diagnosis not present

## 2017-08-10 DIAGNOSIS — S22080A Wedge compression fracture of T11-T12 vertebra, initial encounter for closed fracture: Secondary | ICD-10-CM | POA: Diagnosis not present

## 2017-08-10 DIAGNOSIS — S22088A Other fracture of T11-T12 vertebra, initial encounter for closed fracture: Secondary | ICD-10-CM | POA: Diagnosis present

## 2017-08-10 DIAGNOSIS — E1142 Type 2 diabetes mellitus with diabetic polyneuropathy: Secondary | ICD-10-CM | POA: Diagnosis present

## 2017-08-10 DIAGNOSIS — M4854XA Collapsed vertebra, not elsewhere classified, thoracic region, initial encounter for fracture: Secondary | ICD-10-CM | POA: Diagnosis not present

## 2017-08-10 DIAGNOSIS — S22089A Unspecified fracture of T11-T12 vertebra, initial encounter for closed fracture: Secondary | ICD-10-CM | POA: Diagnosis not present

## 2017-08-10 DIAGNOSIS — M109 Gout, unspecified: Secondary | ICD-10-CM | POA: Diagnosis present

## 2017-08-10 LAB — TROPONIN I: Troponin I: <0.03 ng/mL (ref <0.03)

## 2017-08-10 LAB — COMPREHENSIVE METABOLIC PANEL
ALBUMIN: 2.9 g/dL — AB (ref 3.5–5.0)
ALT: 38 U/L (ref 17–63)
ANION GAP: 11 (ref 5–15)
AST: 31 U/L (ref 15–41)
Alkaline Phosphatase: 160 U/L — ABNORMAL HIGH (ref 38–126)
BUN: 118 mg/dL — AB (ref 6–20)
CHLORIDE: 100 mmol/L — AB (ref 101–111)
CO2: 18 mmol/L — ABNORMAL LOW (ref 22–32)
Calcium: 9 mg/dL (ref 8.9–10.3)
Creatinine, Ser: 5.37 mg/dL — ABNORMAL HIGH (ref 0.61–1.24)
GFR calc Af Amer: 11 mL/min — ABNORMAL LOW (ref >60)
GFR, EST NON AFRICAN AMERICAN: 9 mL/min — AB (ref >60)
GLUCOSE: 333 mg/dL — AB (ref 65–99)
POTASSIUM: 5.2 mmol/L — AB (ref 3.5–5.1)
Sodium: 129 mmol/L — ABNORMAL LOW (ref 135–145)
Total Bilirubin: 0.8 mg/dL (ref 0.3–1.2)
Total Protein: 7.4 g/dL (ref 6.5–8.1)

## 2017-08-10 LAB — CBC WITH DIFFERENTIAL/PLATELET
Basophils Absolute: 0.1 10*3/uL (ref 0–0.1)
Basophils Relative: 1 %
EOS PCT: 1 %
Eosinophils Absolute: 0.1 10*3/uL (ref 0–0.7)
HEMATOCRIT: 45.2 % (ref 40.0–52.0)
Hemoglobin: 14.8 g/dL (ref 13.0–18.0)
LYMPHS ABS: 1.9 10*3/uL (ref 1.0–3.6)
LYMPHS PCT: 17 %
MCH: 29.8 pg (ref 26.0–34.0)
MCHC: 32.6 g/dL (ref 32.0–36.0)
MCV: 91.3 fL (ref 80.0–100.0)
Monocytes Absolute: 0.6 10*3/uL (ref 0.2–1.0)
Monocytes Relative: 6 %
NEUTROS ABS: 8.5 10*3/uL — AB (ref 1.4–6.5)
Neutrophils Relative %: 75 %
PLATELETS: 183 10*3/uL (ref 150–440)
RBC: 4.95 MIL/uL (ref 4.40–5.90)
RDW: 14.3 % (ref 11.5–14.5)
WBC: 11.3 10*3/uL — AB (ref 3.8–10.6)

## 2017-08-10 LAB — URINALYSIS, COMPLETE (UACMP) WITH MICROSCOPIC
BILIRUBIN URINE: NEGATIVE
KETONES UR: NEGATIVE mg/dL
NITRITE: NEGATIVE
PROTEIN: 100 mg/dL — AB
Specific Gravity, Urine: 1.01 (ref 1.005–1.030)
Squamous Epithelial / LPF: NONE SEEN
pH: 5 (ref 5.0–8.0)

## 2017-08-10 LAB — LIPASE, BLOOD: Lipase: 36 U/L (ref 11–51)

## 2017-08-10 MED ORDER — DEXTROSE 5 % IV SOLN: 500.0000 mg | Freq: Once | INTRAVENOUS | Status: DC

## 2017-08-10 MED ORDER — ONDANSETRON HCL 4 MG/2ML IJ SOLN
4.0000 mg | Freq: Once | INTRAMUSCULAR | Status: AC
  Administered 2017-08-10: 4 mg via INTRAVENOUS

## 2017-08-10 MED ORDER — SODIUM CHLORIDE 0.9 % IV BOLUS (SEPSIS)
500.0000 mL | Freq: Once | INTRAVENOUS | Status: AC
  Administered 2017-08-10: 500 mL via INTRAVENOUS

## 2017-08-10 MED ORDER — DEXTROSE 5 % IV SOLN: 1.0000 g | Freq: Once | INTRAVENOUS | Status: DC

## 2017-08-10 MED ORDER — VANCOMYCIN HCL IN DEXTROSE 1-5 GM/200ML-% IV SOLN
1000.0000 mg | Freq: Once | INTRAVENOUS | Status: AC
  Administered 2017-08-11: 1000 mg via INTRAVENOUS
  Filled 2017-08-10: qty 200

## 2017-08-10 MED ORDER — PIPERACILLIN-TAZOBACTAM 3.375 G IVPB: 3.3750 g | Freq: Two times a day (BID) | INTRAVENOUS | Status: DC

## 2017-08-10 MED ORDER — IOPAMIDOL (ISOVUE-300) INJECTION 61%
15.0000 mL | INTRAVENOUS | Status: AC
  Administered 2017-08-10 (×2): 15 mL via ORAL

## 2017-08-10 MED ORDER — ONDANSETRON HCL 4 MG/2ML IJ SOLN
INTRAMUSCULAR | Status: AC
  Administered 2017-08-10: 4 mg via INTRAVENOUS
  Filled 2017-08-10: qty 2

## 2017-08-10 MED ORDER — PIPERACILLIN-TAZOBACTAM 3.375 G IVPB
3.3750 g | Freq: Three times a day (TID) | INTRAVENOUS | Status: DC
Start: 2017-08-10 — End: 2017-08-10

## 2017-08-10 MED ORDER — CEFTRIAXONE SODIUM IN DEXTROSE 20 MG/ML IV SOLN
1.0000 g | Freq: Once | INTRAVENOUS | Status: DC
Start: 2017-08-10 — End: 2017-08-10

## 2017-08-10 MED ORDER — PIPERACILLIN-TAZOBACTAM 3.375 G IVPB 30 MIN
3.3750 g | Freq: Once | INTRAVENOUS | Status: AC
Start: 2017-08-10 — End: 2017-08-11
  Administered 2017-08-10: 3.375 g via INTRAVENOUS
  Filled 2017-08-10: qty 50

## 2017-08-10 NOTE — Progress Notes (Addendum)
Pharmacy Antibiotic Note  [name] is a 76 y.o. male admitted on 08/10/2017 with pneumonia.  Pharmacy has been consulted for vanc/zosyn dosing.  Plan: Patient received vanc 1g and zosyn 3.375g IV x 1 in ED  Will f/u w/ vanc 1.5g IV q48h w/ 6 hour stack dose. Will draw VT 11/2 @ 0500 prior to 3rd dose. Will start zosyn 3.375g IV q12h for CrCl < 20 ml/min  Ke 0.0166 T1/2 41 ~ 48 hrs Goal trough 15 - 20 mcg/mL  Height: 6\' 2"  (188 cm) Weight: 230 lb (104.3 kg) IBW/kg (Calculated) : 82.2  Temp (24hrs), Avg:97.4 F (36.3 C), Min:97.4 F (36.3 C), Max:97.4 F (36.3 C)   Recent Labs Lab 08/10/17 1958  WBC 11.3*  CREATININE 5.37*    Estimated Creatinine Clearance: 14.8 mL/min (A) (by C-G formula based on SCr of 5.37 mg/dL (H)).    Allergies  Allergen Reactions  . Ace Inhibitors Other (See Comments)    Reaction:  Raises potassium   . Quinapril Rash    hyperkalemia    Thank you for allowing pharmacy to be a part of this patient's care.  Tobie Lords, PharmD, BCPS Clinical Pharmacist 08/10/2017

## 2017-08-10 NOTE — ED Provider Notes (Addendum)
[facility] Emergency Department Provider Note  ____________________________________________   I have reviewed the triage vital signs and the nursing notes.   HISTORY  Chief Complaint Weakness and Emesis    HPI [name] is a 76 y.o. male with a history of colon cancer remotely, CAD, diabetes mellitus, hyperlipidemia, PVD, pneumonia, thyroid disease, CVA, right carotid endarterectomy on 5 October.  He has had no numbness or weakness but since that time he has had decreased strength and and has not been eating or drinking very much.  Family states they feel that he is starting to get dehydrated.  He has had no fevers or chills.  He has had intermittent vomiting.  He was constipated and took some medication, Dulcolax, and had severe diarrhea.  He has had no recent antibiotics.  He vomited a few times today.  He states he has had decreased urination.  Family states that for the last month they have been unable to get him to really eat or drink very much he has no appetite.  He does not have abdominal pain he states.  He does have a history of reflux disease this is been bothering him as well no hematemesis or melena.     Past Medical History:  Diagnosis Date  . Blood transfusion without reported diagnosis   . Cancer [facility])    Colon  . Coronary artery disease   . Diabetes mellitus without complication ([place])   . Dysrhythmia   . Hyperlipidemia   . Hypertension   . Myocardial infarction ([place]) 1997  . Peripheral vascular disease ([place])   . Pneumonia 2014  . Stroke ([place]) 05/2017  . Thyroid disease     Patient Active Problem List   Diagnosis Date Noted  . Bilateral carotid artery stenosis 08/01/2017  . Hoarseness of voice 08/01/2017  . Compression fracture of body of thoracic vertebra ([place]) 07/28/2017  . Carotid stenosis, symptomatic, with infarction ([place]) 07/17/2017  . Cerebrovascular accident (CVA) ([place]) 06/11/2017  . Chronic shoulder bursitis, left  07/22/2016  . Cholecystitis, acute 04/21/2016  . 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 CKD stage 4 due to type 2 diabetes mellitus ([place]) 03/16/2015  . Diabetes mellitus with polyneuropathy ([place]) 03/16/2015  . CAD in native artery 01/20/2015  . Gout 01/20/2015  . Diabetic peripheral neuropathy ([place]) 01/20/2015  . H/O malignant neoplasm of colon 01/20/2015  . Adult hypothyroidism 01/20/2015  . Hyperlipidemia associated with type 2 diabetes mellitus ([place]) 01/20/2015  . Encounter for long-term (current) use of insulin ([place]) 07/27/2014  . High potassium 05/19/2014  . 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 Hypertension in stage 4 chronic kidney disease due to type 2 diabetes mellitus ([place]) 07/15/2013    Past Surgical History:  Procedure Laterality Date  . APPENDECTOMY  2011  . BACK SURGERY    . CARDIAC CATHETERIZATION    . CAROTID ANGIOGRAPHY Right 06/13/2017   Procedure: Right subclavian and Carotid Angiography, possible intervention;  Surgeon: Algernon Huxley, MD;  Location: [place] CV [facility];  Service: Cardiovascular;  Laterality: Right;  . CHOLECYSTECTOMY N/A 04/23/2016   Procedure: LAPAROSCOPIC CHOLECYSTECTOMY;  Surgeon: [name], MD;  Location: [facility] ORS;  Service: General;  Laterality: N/A;  . COLON SURGERY  2011   Colectomy for ileo-cecal valve cancer  . CORONARY ARTERY BYPASS GRAFT  1997   x 3  .  ENDARTERECTOMY Right 07/17/2017   Procedure: ENDARTERECTOMY CAROTID;  Surgeon: Algernon Huxley, MD;  Location: [facility] ORS;  Service: Vascular;  Laterality: Right;  . HERNIA REPAIR  2011   Ventral hernia  . KNEE SURGERY Left   . PROSTATE SURGERY  2002   BPH benign pathology  . SPINE SURGERY  1989   Lumbar disc    Prior to Admission medications   Medication Sig Start Date End Date Taking? Authorizing Provider   aspirin EC 81 MG EC tablet Take 1 tablet (81 mg total) by mouth daily. 07/19/17   Algernon Huxley, MD  atenolol (TENORMIN) 50 MG tablet Take 1 tablet (50 mg total) by mouth daily. 08/08/17   Glean Hess, MD  atorvastatin (LIPITOR) 40 MG tablet Take 1 tablet (40 mg total) by mouth daily at 6 PM. 06/14/17   Vaughan Basta, MD  Blood Glucose Monitoring Suppl ([place]) [place]  01/29/14   [provider]  clopidogrel (PLAVIX) 75 MG tablet Take 1 tablet (75 mg total) by mouth daily. 08/08/17   Glean Hess, MD  glucose blood (RELION PRIME TEST) test strip  02/20/15   [provider]  insulin NPH Human (HUMULIN N,NOVOLIN N) 100 UNIT/ML injection Inject 30 Units into the skin 2 (two) times daily.    [provider]  insulin regular (NOVOLIN R,HUMULIN R) 100 units/mL injection Inject 10-15 Units into the skin 2 (two) times daily. TAKES 15 UNITS IN THE MORNING AND 10 UNITS WITH SUPPER    [provider]  levothyroxine (SYNTHROID, LEVOTHROID) 300 MCG tablet Take 300 mcg by mouth daily before breakfast.    [provider]  mupirocin cream (BACTROBAN) 2 % Apply 1 application topically 2 (two) times daily. Patient not taking: Reported on 07/21/2017 07/22/16   Glean Hess, MD  oxyCODONE-acetaminophen (PERCOCET) 7.5-325 MG tablet Take 1 tablet by mouth every 6 (six) hours as needed for severe pain. Patient not taking: Reported on 07/31/2017 07/28/17   Sable Feil, PA-C  oxyCODONE-acetaminophen (PERCOCET/ROXICET) 5-325 MG tablet Take 1-2 tablets by mouth every 4 (four) hours as needed for moderate pain. 07/18/17   Algernon Huxley, MD  sodium bicarbonate 650 MG tablet Take 1 tablet by mouth 2 (two) times daily. 04/28/17 04/28/18  [provider]    Allergies Ace inhibitors and Quinapril  Family History  Problem Relation Age of Onset  . Cancer Mother        Lung  . Diabetes Mother   . Heart disease Mother   . Heart disease Father      Social History Social History  Substance Use Topics  . Smoking status: Former Smoker    Types: Cigarettes    Quit date: 06/24/1996  . Smokeless tobacco: Never Used  . Alcohol use No    Review of Systems Constitutional: No fever/chills Eyes: No visual changes. ENT: No sore throat. No stiff neck no neck pain Cardiovascular: Denies chest pain. Respiratory: Denies shortness of breath. Gastrointestinal:   no vomiting.  No diarrhea.  No constipation. Genitourinary: Negative for dysuria. Musculoskeletal: Negative lower extremity swelling Skin: Negative for rash. Neurological: Negative for severe headaches, focal weakness or numbness.   ____________________________________________   PHYSICAL EXAM:  VITAL SIGNS: ED Triage Vitals  Enc Vitals Group     BP 08/10/17 1949 129/80     Pulse Rate 08/10/17 1949 85     Resp 08/10/17 1949 20     Temp 08/10/17 1949 (!) 97.4 F (36.3 C)  Temp Source 08/10/17 1949 Oral     SpO2 08/10/17 1949 99 %     Weight 08/10/17 1955 230 lb (104.3 kg)     Height 08/10/17 1955 6\' 2"  (1.88 m)     Head Circumference --      Peak Flow --      Pain Score 08/10/17 1949 3     Pain Loc --      Pain Edu? --      Excl. in [place]? --     Constitutional: Alert and oriented.  Phonically ill-appearing and somewhat debilitated but not toxic and acute since Eyes: Conjunctivae are normal Head: Atraumatic HEENT: No congestion/rhinnorhea.  Dry membranes are moist.  Oropharynx non-erythematous Neck:   Nontender with no meningismus, no masses, no stridor Cardiovascular: Normal rate, regular rhythm. Grossly normal heart sounds.  Good peripheral circulation. Respiratory: Normal respiratory effort.  No retractions. Lungs CTAB. Abdominal:, Nonsurgical abdomen however, there is some left-sided abdominal discomfort without guarding and some suprapubic fullness. Back:  There is no focal tenderness or step off.  there is no midline tenderness there are no lesions noted.  there is no CVA tenderness Musculoskeletal: No lower extremity tenderness, no upper extremity tenderness. No joint effusions, no DVT signs strong distal pulses no edema Neurologic:  Normal speech and language. No gross focal neurologic deficits are appreciated.  Skin:  Skin is warm, dry and intact. No rash noted. Psychiatric: Mood and affect are normal. Speech and behavior are normal.  ____________________________________________   LABS (all labs ordered are listed, but only abnormal results are displayed)  Labs Reviewed  COMPREHENSIVE METABOLIC PANEL - Abnormal; Notable for the following:       Result Value   Sodium 129 (*)    Potassium 5.2 (*)    Chloride 100 (*)    CO2 18 (*)    Glucose, Bld 333 (*)    BUN 118 (*)    Creatinine, Ser 5.37 (*)    Albumin 2.9 (*)    Alkaline Phosphatase 160 (*)    GFR calc non Af Amer 9 (*)    GFR calc Af Amer 11 (*)    All other components within normal limits  CBC WITH DIFFERENTIAL/PLATELET - Abnormal; Notable for the following:    WBC 11.3 (*)    Neutro Abs 8.5 (*)    All other components within normal limits  LIPASE, BLOOD  TROPONIN I  URINALYSIS, COMPLETE (UACMP) WITH MICROSCOPIC    Pertinent labs  results that were available during my care of the patient were reviewed by me and considered in my medical decision making (see chart for details). ____________________________________________  EKG  I personally interpreted any EKGs ordered by me or triage Sinus rhythm rate 77 bpm, LAD noted, no acute ST elevation or depression nonspecific ST changes ____________________________________________  RADIOLOGY  Pertinent labs & imaging results that were available during my care of the patient were reviewed by me and considered in my medical decision making (see chart for details). If possible, patient and/or family made aware of any abnormal findings. ____________________________________________    PROCEDURES  Procedure(s) performed:  None  Procedures  Critical Care performed: CRITICAL CARE Performed by: Schuyler Amor   Total critical care time: 42 minutes  Critical care time was exclusive of separately billable procedures and treating other patients.  Critical care was necessary to treat or prevent imminent or life-threatening deterioration.  Critical care was time spent personally by me on the following activities: development of treatment plan with  patient and/or surrogate as well as nursing, discussions with consultants, evaluation of patient's response to treatment, examination of patient, obtaining history from patient or surrogate, ordering and performing treatments and interventions, ordering and review of laboratory studies, ordering and review of radiographic studies, pulse oximetry and re-evaluation of patient's condition.   ____________________________________________   INITIAL IMPRESSION / ASSESSMENT AND PLAN / ED COURSE  Pertinent labs & imaging results that were available during my care of the patient were reviewed by me and considered in my medical decision making (see chart for details). Here with generalized weakness, feeling worse over the last several weeks, decreased p.o.  Some abdominal discomfort.  Differential is quite broad.  Ischemic it is possible but he is not complaining of pain or pain out of proportion he does have some tenderness I will obtain a CT scan.  Would work shows a creatinine of over 5 which is very concerning.  His BUN is quite elevated.  This is probably prerenal could be postobstructive as well.  CT scan should help tease that out.  I also checked for silent ischemia which is reassuring, continuing to hydrate the patient.  Potassium was only 5.2 which is hopefully not in the danger zone.  His glucose is elevated that should hopefully respond to fluids.  Patient has been dwindling since his surgical intervention, surgical site appears to be clean dry and intact. Cardiac enzymes  are reassuring.  Patient did of note have hematuria but was taken off Plavix recently for that reason, he is no longer suffers from hematuria apparently, however, the consideration for mass or other obstructing pathology should be assessed by CT.  ----------------------------------------- 10:57 PM on 08/10/2017 -----------------------------------------  Pt does admit to cough ct shows ? Aspiration.  She has been coughing, also had a recent hospitalization, will need broad-spectrum antibiotics for his   purulent appearing milky urine and his likely aspiration/hospital-acquired pneumonia.  I will discuss with pharmacy.  Blood cultures are being obtained we are giving him recurrent IV fluid boluses he is on his 1500 cc.  Blood cultures are pending, hospitalist will admit, family and patient made aware of all findings including possible cancer and aneurysm etc.  ----------------------------------------- 11:06 PM on 08/10/2017 -----------------------------------------  Per pharmacy best choice would be vanc and zosyn.   ____________________________________________   FINAL CLINICAL IMPRESSION(S) / ED DIAGNOSES  Final diagnoses:  None      This chart was dictated using voice recognition software.  Despite best efforts to proofread,  errors can occur which can change meaning.      Schuyler Amor, MD 08/10/17 [id number]    Schuyler Amor, MD 08/10/17 [id number]    Schuyler Amor, MD 08/10/17 [id number]    Schuyler Amor, MD 08/10/17 [phone]

## 2017-08-10 NOTE — ED Notes (Signed)
Patient transported to radiology

## 2017-08-11 ENCOUNTER — Inpatient Hospital Stay: Payer: PPO | Admitting: Internal Medicine

## 2017-08-11 ENCOUNTER — Inpatient Hospital Stay: Payer: PPO

## 2017-08-11 DIAGNOSIS — J189 Pneumonia, unspecified organism: Secondary | ICD-10-CM

## 2017-08-11 DIAGNOSIS — Z794 Long term (current) use of insulin: Secondary | ICD-10-CM | POA: Diagnosis not present

## 2017-08-11 DIAGNOSIS — N179 Acute kidney failure, unspecified: Secondary | ICD-10-CM | POA: Diagnosis present

## 2017-08-11 DIAGNOSIS — R1310 Dysphagia, unspecified: Secondary | ICD-10-CM | POA: Diagnosis present

## 2017-08-11 DIAGNOSIS — Z87891 Personal history of nicotine dependence: Secondary | ICD-10-CM | POA: Diagnosis not present

## 2017-08-11 DIAGNOSIS — S22080A Wedge compression fracture of T11-T12 vertebra, initial encounter for closed fracture: Secondary | ICD-10-CM | POA: Insufficient documentation

## 2017-08-11 DIAGNOSIS — I252 Old myocardial infarction: Secondary | ICD-10-CM | POA: Diagnosis not present

## 2017-08-11 DIAGNOSIS — N2889 Other specified disorders of kidney and ureter: Secondary | ICD-10-CM | POA: Diagnosis present

## 2017-08-11 DIAGNOSIS — E785 Hyperlipidemia, unspecified: Secondary | ICD-10-CM | POA: Diagnosis present

## 2017-08-11 DIAGNOSIS — E1122 Type 2 diabetes mellitus with diabetic chronic kidney disease: Secondary | ICD-10-CM | POA: Diagnosis present

## 2017-08-11 DIAGNOSIS — Z8673 Personal history of transient ischemic attack (TIA), and cerebral infarction without residual deficits: Secondary | ICD-10-CM | POA: Diagnosis not present

## 2017-08-11 DIAGNOSIS — E1151 Type 2 diabetes mellitus with diabetic peripheral angiopathy without gangrene: Secondary | ICD-10-CM | POA: Diagnosis present

## 2017-08-11 DIAGNOSIS — Z66 Do not resuscitate: Secondary | ICD-10-CM | POA: Diagnosis present

## 2017-08-11 DIAGNOSIS — N390 Urinary tract infection, site not specified: Secondary | ICD-10-CM | POA: Diagnosis present

## 2017-08-11 DIAGNOSIS — E1165 Type 2 diabetes mellitus with hyperglycemia: Secondary | ICD-10-CM | POA: Diagnosis present

## 2017-08-11 DIAGNOSIS — R338 Other retention of urine: Secondary | ICD-10-CM | POA: Diagnosis not present

## 2017-08-11 DIAGNOSIS — I129 Hypertensive chronic kidney disease with stage 1 through stage 4 chronic kidney disease, or unspecified chronic kidney disease: Secondary | ICD-10-CM | POA: Diagnosis present

## 2017-08-11 DIAGNOSIS — Z79899 Other long term (current) drug therapy: Secondary | ICD-10-CM | POA: Diagnosis not present

## 2017-08-11 DIAGNOSIS — N184 Chronic kidney disease, stage 4 (severe): Secondary | ICD-10-CM | POA: Diagnosis present

## 2017-08-11 DIAGNOSIS — I2510 Atherosclerotic heart disease of native coronary artery without angina pectoris: Secondary | ICD-10-CM | POA: Diagnosis present

## 2017-08-11 DIAGNOSIS — W010XXA Fall on same level from slipping, tripping and stumbling without subsequent striking against object, initial encounter: Secondary | ICD-10-CM | POA: Diagnosis present

## 2017-08-11 DIAGNOSIS — E860 Dehydration: Secondary | ICD-10-CM | POA: Diagnosis present

## 2017-08-11 DIAGNOSIS — Z7982 Long term (current) use of aspirin: Secondary | ICD-10-CM | POA: Diagnosis not present

## 2017-08-11 DIAGNOSIS — N401 Enlarged prostate with lower urinary tract symptoms: Secondary | ICD-10-CM | POA: Diagnosis present

## 2017-08-11 DIAGNOSIS — S22088A Other fracture of T11-T12 vertebra, initial encounter for closed fracture: Secondary | ICD-10-CM | POA: Diagnosis present

## 2017-08-11 DIAGNOSIS — R29890 Loss of height: Secondary | ICD-10-CM | POA: Diagnosis present

## 2017-08-11 DIAGNOSIS — J690 Pneumonitis due to inhalation of food and vomit: Secondary | ICD-10-CM | POA: Diagnosis present

## 2017-08-11 DIAGNOSIS — R531 Weakness: Secondary | ICD-10-CM | POA: Diagnosis present

## 2017-08-11 DIAGNOSIS — Z951 Presence of aortocoronary bypass graft: Secondary | ICD-10-CM | POA: Diagnosis not present

## 2017-08-11 HISTORY — DX: Pneumonia, unspecified organism: J18.9

## 2017-08-11 LAB — HEMOGLOBIN A1C
Hgb A1c MFr Bld: 7.1 % — ABNORMAL HIGH (ref 4.8–5.6)
MEAN PLASMA GLUCOSE: 157.07 mg/dL

## 2017-08-11 LAB — GLUCOSE, CAPILLARY
GLUCOSE-CAPILLARY: 90 mg/dL (ref 65–99)
GLUCOSE-CAPILLARY: 164 mg/dL — AB (ref 65–99)
Glucose-Capillary: 272 mg/dL — ABNORMAL HIGH (ref 65–99)
Glucose-Capillary: 270 mg/dL — ABNORMAL HIGH (ref 65–99)
Glucose-Capillary: 120 mg/dL — ABNORMAL HIGH (ref 65–99)

## 2017-08-11 LAB — BASIC METABOLIC PANEL
ANION GAP: 6 (ref 5–15)
BUN: 97 mg/dL — ABNORMAL HIGH (ref 6–20)
CHLORIDE: 106 mmol/L (ref 101–111)
CO2: 21 mmol/L — AB (ref 22–32)
Calcium: 8.4 mg/dL — ABNORMAL LOW (ref 8.9–10.3)
Creatinine, Ser: 4.59 mg/dL — ABNORMAL HIGH (ref 0.61–1.24)
GFR calc non Af Amer: 11 mL/min — ABNORMAL LOW (ref >60)
GFR, EST AFRICAN AMERICAN: 13 mL/min — AB (ref >60)
GLUCOSE: 292 mg/dL — AB (ref 65–99)
POTASSIUM: 5.3 mmol/L — AB (ref 3.5–5.1)
Sodium: 133 mmol/L — ABNORMAL LOW (ref 135–145)

## 2017-08-11 MED ORDER — INSULIN DETEMIR 100 UNIT/ML ~~LOC~~ SOLN
30.0000 [IU] | Freq: Two times a day (BID) | SUBCUTANEOUS | Status: DC
  Administered 2017-08-11: 30 [IU] via SUBCUTANEOUS
  Filled 2017-08-11 (×3): qty 0.3

## 2017-08-11 MED ORDER — ATORVASTATIN CALCIUM 20 MG PO TABS
40.0000 mg | ORAL_TABLET | Freq: Every day | ORAL | Status: DC
  Administered 2017-08-11 – 2017-08-14 (×4): 40 mg via ORAL
  Filled 2017-08-11 (×4): qty 2

## 2017-08-11 MED ORDER — CLOPIDOGREL BISULFATE 75 MG PO TABS
75.0000 mg | ORAL_TABLET | Freq: Every day | ORAL | Status: DC
  Filled 2017-08-11: qty 1

## 2017-08-11 MED ORDER — HEPARIN SODIUM (PORCINE) 5000 UNIT/ML IJ SOLN
5000.0000 [IU] | Freq: Three times a day (TID) | INTRAMUSCULAR | Status: DC
  Administered 2017-08-11 – 2017-08-12 (×4): 5000 [IU] via SUBCUTANEOUS
  Filled 2017-08-11 (×4): qty 1

## 2017-08-11 MED ORDER — OXYCODONE-ACETAMINOPHEN 7.5-325 MG PO TABS: 1.0000 | ORAL_TABLET | Freq: Four times a day (QID) | ORAL | Status: DC | PRN

## 2017-08-11 MED ORDER — SODIUM CHLORIDE 0.9% FLUSH
3.0000 mL | Freq: Two times a day (BID) | INTRAVENOUS | Status: DC
  Administered 2017-08-11 – 2017-08-15 (×7): 3 mL via INTRAVENOUS

## 2017-08-11 MED ORDER — SODIUM BICARBONATE 650 MG PO TABS
650.0000 mg | ORAL_TABLET | Freq: Two times a day (BID) | ORAL | Status: DC
  Administered 2017-08-11 – 2017-08-15 (×9): 650 mg via ORAL
  Filled 2017-08-11 (×11): qty 1

## 2017-08-11 MED ORDER — INSULIN REGULAR HUMAN 100 UNIT/ML IJ SOLN: 10.0000 [IU] | Freq: Two times a day (BID) | INTRAMUSCULAR | Status: DC

## 2017-08-11 MED ORDER — ATENOLOL 50 MG PO TABS
50.0000 mg | ORAL_TABLET | Freq: Every day | ORAL | Status: DC
  Administered 2017-08-11 – 2017-08-15 (×5): 50 mg via ORAL
  Filled 2017-08-11 (×5): qty 1

## 2017-08-11 MED ORDER — PIPERACILLIN-TAZOBACTAM 3.375 G IVPB
3.3750 g | Freq: Two times a day (BID) | INTRAVENOUS | Status: DC
  Administered 2017-08-11 – 2017-08-12 (×4): 3.375 g via INTRAVENOUS
  Filled 2017-08-11 (×5): qty 50

## 2017-08-11 MED ORDER — ACETAMINOPHEN 650 MG RE SUPP: 650.0000 mg | Freq: Four times a day (QID) | RECTAL | Status: DC | PRN

## 2017-08-11 MED ORDER — INSULIN ASPART 100 UNIT/ML ~~LOC~~ SOLN
0.0000 [IU] | Freq: Three times a day (TID) | SUBCUTANEOUS | Status: DC
  Administered 2017-08-11: 8 [IU] via SUBCUTANEOUS
  Administered 2017-08-12: 13:00:00 3 [IU] via SUBCUTANEOUS
  Administered 2017-08-12: 5 [IU] via SUBCUTANEOUS
  Administered 2017-08-12: 3 [IU] via SUBCUTANEOUS
  Administered 2017-08-13: 12:00:00 2 [IU] via SUBCUTANEOUS
  Administered 2017-08-13: 17:00:00 3 [IU] via SUBCUTANEOUS
  Administered 2017-08-13: 08:00:00 2 [IU] via SUBCUTANEOUS
  Administered 2017-08-14: 17:00:00 5 [IU] via SUBCUTANEOUS
  Administered 2017-08-15: 2 [IU] via SUBCUTANEOUS
  Administered 2017-08-15: 12:00:00 3 [IU] via SUBCUTANEOUS
  Filled 2017-08-11 (×10): qty 1

## 2017-08-11 MED ORDER — LEVOTHYROXINE SODIUM 100 MCG PO TABS
300.0000 ug | ORAL_TABLET | Freq: Every day | ORAL | Status: DC
  Administered 2017-08-11 – 2017-08-15 (×4): 300 ug via ORAL
  Filled 2017-08-11 (×4): qty 3

## 2017-08-11 MED ORDER — SODIUM CHLORIDE 0.9 % IV SOLN
INTRAVENOUS | Status: DC
  Administered 2017-08-11 – 2017-08-14 (×6): via INTRAVENOUS

## 2017-08-11 MED ORDER — PIPERACILLIN-TAZOBACTAM 3.375 G IVPB 30 MIN
3.3750 g | Freq: Two times a day (BID) | INTRAVENOUS | Status: DC
  Filled 2017-08-11 (×2): qty 50

## 2017-08-11 MED ORDER — OXYCODONE-ACETAMINOPHEN 7.5-325 MG PO TABS
1.0000 | ORAL_TABLET | Freq: Four times a day (QID) | ORAL | Status: DC | PRN
  Administered 2017-08-11 – 2017-08-12 (×2): 1 via ORAL
  Filled 2017-08-11 (×2): qty 1

## 2017-08-11 MED ORDER — POLYETHYLENE GLYCOL 3350 17 G PO PACK: 17.0000 g | PACK | Freq: Every day | ORAL | Status: DC | PRN

## 2017-08-11 MED ORDER — INSULIN NPH (HUMAN) (ISOPHANE) 100 UNIT/ML ~~LOC~~ SUSP: 30.0000 [IU] | Freq: Two times a day (BID) | SUBCUTANEOUS | Status: DC

## 2017-08-11 MED ORDER — VANCOMYCIN HCL 10 G IV SOLR
1500.0000 mg | INTRAVENOUS | Status: DC
  Administered 2017-08-11: 11:00:00 1500 mg via INTRAVENOUS
  Filled 2017-08-11: qty 1500

## 2017-08-11 MED ORDER — INSULIN ASPART 100 UNIT/ML ~~LOC~~ SOLN: 15.0000 [IU] | Freq: Every day | SUBCUTANEOUS | Status: DC

## 2017-08-11 MED ORDER — ONDANSETRON HCL 4 MG PO TABS: 4.0000 mg | ORAL_TABLET | Freq: Four times a day (QID) | ORAL | Status: DC | PRN

## 2017-08-11 MED ORDER — INSULIN NPH (HUMAN) (ISOPHANE) 100 UNIT/ML ~~LOC~~ SUSP
30.0000 [IU] | Freq: Two times a day (BID) | SUBCUTANEOUS | Status: DC
  Filled 2017-08-11: qty 10

## 2017-08-11 MED ORDER — ASPIRIN EC 81 MG PO TBEC
81.0000 mg | DELAYED_RELEASE_TABLET | Freq: Every day | ORAL | Status: DC
  Administered 2017-08-11 – 2017-08-15 (×5): 81 mg via ORAL
  Filled 2017-08-11 (×5): qty 1

## 2017-08-11 MED ORDER — INSULIN ASPART 100 UNIT/ML ~~LOC~~ SOLN: 10.0000 [IU] | Freq: Every day | SUBCUTANEOUS | Status: DC

## 2017-08-11 MED ORDER — ACETAMINOPHEN 325 MG PO TABS: 650.0000 mg | ORAL_TABLET | Freq: Four times a day (QID) | ORAL | Status: DC | PRN

## 2017-08-11 MED ORDER — INSULIN REGULAR HUMAN 100 UNIT/ML IJ SOLN
15.0000 [IU] | Freq: Every morning | INTRAMUSCULAR | Status: DC
  Administered 2017-08-11: 15 [IU] via SUBCUTANEOUS
  Filled 2017-08-11: qty 0.15

## 2017-08-11 MED ORDER — TAMSULOSIN HCL 0.4 MG PO CAPS
0.4000 mg | ORAL_CAPSULE | Freq: Every day | ORAL | Status: DC
  Administered 2017-08-11 – 2017-08-15 (×5): 0.4 mg via ORAL
  Filled 2017-08-11 (×5): qty 1

## 2017-08-11 MED ORDER — SODIUM POLYSTYRENE SULFONATE 15 GM/60ML PO SUSP
15.0000 g | Freq: Once | ORAL | Status: AC
  Administered 2017-08-11: 17:00:00 15 g via ORAL
  Filled 2017-08-11: qty 60

## 2017-08-11 MED ORDER — INSULIN REGULAR HUMAN 100 UNIT/ML IJ SOLN
10.0000 [IU] | Freq: Every day | INTRAMUSCULAR | Status: DC
  Filled 2017-08-11: qty 0.1

## 2017-08-11 MED ORDER — HYDRALAZINE HCL 20 MG/ML IJ SOLN
10.0000 mg | INTRAMUSCULAR | Status: DC | PRN
  Administered 2017-08-11: 05:00:00 10 mg via INTRAVENOUS
  Filled 2017-08-11: qty 1

## 2017-08-11 MED ORDER — ONDANSETRON HCL 4 MG/2ML IJ SOLN
4.0000 mg | Freq: Four times a day (QID) | INTRAMUSCULAR | Status: DC | PRN
  Administered 2017-08-11: 18:00:00 4 mg via INTRAVENOUS
  Filled 2017-08-11: qty 2

## 2017-08-11 NOTE — Consult Note (Addendum)
ORTHOPAEDIC CONSULTATION  REQUESTING PHYSICIAN: Vaughan Basta, *  Chief Complaint:   Mid back pain 2 weeks.  History of Present Illness: [name] is a 76 y.o. male with multiple medical problems, including diabetes, hypertension, hyperlipidemia, peripheral vascular disease, coronary artery disease, and status post an MI who lives at home with his wife. Apparently, 2 weeks ago while ambulating in his house with his walker, he lost his balance and fell, landing on his buttocks. He presented to the emergency room where x-rays and a CT scan demonstrated the presence of an acute T12 compression fracture. He was sent home, but because of continued pain, was seen by [name], PA-C, in the [facility] orthopedic clinic. An MRI scan was ordered which confirmed the presence of an acute T12 compression fracture. Because of worsening pain, the patient presented back to the emergency room where he was noted to have an acute kidney injury as well as pneumonia. Therefore, the patient was admitted for treatment of these other problems, as well as for further management of his T12 compression fracture. The patient denies any symptoms of paresthesias to the lower extremity, and denies any bowel or bladder complaints.  Past Medical History:  Diagnosis Date  . Blood transfusion without reported diagnosis   . 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 Coronary artery disease   . Diabetes mellitus without complication ([place])   . Dysrhythmia   . Hyperlipidemia   . Hypertension   . Myocardial infarction ([place]) 1997  . Peripheral vascular disease ([place])   . Pneumonia 2014  . Stroke ([place]) 05/2017  . Thyroid disease    Past Surgical History:  Procedure Laterality Date  . APPENDECTOMY  2011  . BACK SURGERY    . CARDIAC CATHETERIZATION    . CAROTID ANGIOGRAPHY Right 06/13/2017   Procedure: Right subclavian and Carotid Angiography, possible intervention;   Surgeon: Algernon Huxley, MD;  Location: [facility];  Service: Cardiovascular;  Laterality: Right;  . CHOLECYSTECTOMY N/A 04/23/2016   Procedure: LAPAROSCOPIC CHOLECYSTECTOMY;  Surgeon: [name], MD;  Location: [facility] ORS;  Service: General;  Laterality: N/A;  . COLON SURGERY  2011   Colectomy for ileo-cecal valve cancer  . CORONARY ARTERY BYPASS GRAFT  1997   x 3  . ENDARTERECTOMY Right 07/17/2017   Procedure: ENDARTERECTOMY CAROTID;  Surgeon: Algernon Huxley, MD;  Location: [facility] ORS;  Service: Vascular;  Laterality: Right;  . HERNIA REPAIR  2011   Ventral hernia  . KNEE SURGERY Left   . PROSTATE SURGERY  2002   BPH benign pathology  . SPINE SURGERY  1989   Lumbar disc   Social History   Social History  . Marital status: Married    Spouse name: N/A  . Number of children: N/A  . Years of education: N/A   Social History Main Topics  . Smoking status: Former Smoker    Types: Cigarettes    Quit date: 06/24/1996  . Smokeless tobacco: Never Used  . Alcohol use No  . Drug use: No  . Sexual activity: Not Asked   Other Topics Concern  . None   Social History Narrative  . None   Family History  Problem Relation Age of Onset  . Cancer Mother        Lung  . Diabetes Mother   . Heart disease Mother   . Heart disease Father    Allergies  Allergen Reactions  . Ace Inhibitors Other (See Comments)    Reaction:  Raises potassium   . Quinapril Rash    hyperkalemia   Prior to Admission medications   Medication Sig Start Date End Date Taking? Authorizing Provider  aspirin EC 81 MG EC tablet Take 1 tablet (81 mg total) by mouth daily. 07/19/17  Yes Dew, Erskine Squibb, MD  atenolol (TENORMIN) 50 MG tablet Take 1 tablet (50 mg total) by mouth daily. 08/08/17  Yes Glean Hess, MD  atorvastatin (LIPITOR) 40 MG tablet Take 1 tablet (40 mg total) by mouth daily at 6 PM. Patient taking differently: Take 40 mg by mouth daily.  06/14/17  Yes Vaughan Basta, MD  insulin NPH  Human (HUMULIN N,NOVOLIN N) 100 UNIT/ML injection Inject 30 Units into the skin 2 (two) times daily.   Yes [provider]  insulin regular (NOVOLIN R,HUMULIN R) 100 units/mL injection Inject 10-15 Units into the skin 2 (two) times daily. TAKES 15 UNITS IN THE MORNING AND 10 UNITS WITH SUPPER   Yes [provider]  levothyroxine (SYNTHROID, LEVOTHROID) 300 MCG tablet Take 300 mcg by mouth daily before breakfast.   Yes [provider]  sodium bicarbonate 650 MG tablet Take 1 tablet by mouth 2 (two) times daily. 04/28/17 04/28/18 Yes [provider]  Blood Glucose Monitoring Suppl ([place]) [place]  01/29/14   [provider]  clopidogrel (PLAVIX) 75 MG tablet Take 1 tablet (75 mg total) by mouth daily. Patient not taking: Reported on 08/10/2017 08/08/17   Glean Hess, MD  glucose blood (RELION PRIME TEST) test strip  02/20/15   [provider]  mupirocin cream (BACTROBAN) 2 % Apply 1 application topically 2 (two) times daily. Patient not taking: Reported on 07/21/2017 07/22/16   Glean Hess, MD  oxyCODONE-acetaminophen (PERCOCET) 7.5-325 MG tablet Take 1 tablet by mouth every 6 (six) hours as needed for severe pain. Patient not taking: Reported on 07/31/2017 07/28/17   Sable Feil, PA-C  oxyCODONE-acetaminophen (PERCOCET/ROXICET) 5-325 MG tablet Take 1-2 tablets by mouth every 4 (four) hours as needed for moderate pain. Patient not taking: Reported on 08/10/2017 07/18/17   Algernon Huxley, MD   Ct Abdomen Pelvis Wo Contrast  Result Date: 08/10/2017 CLINICAL DATA:  76 year old male with abdominal pain, nausea and acute renal failure. EXAM: CT ABDOMEN AND PELVIS WITHOUT CONTRAST TECHNIQUE: Multidetector CT imaging of the abdomen and pelvis was performed following the standard protocol without IV contrast. COMPARISON:  Abdominal CT dated 04/21/2016 FINDINGS: Evaluation of this exam is limited in the absence of intravenous contrast.  Lower chest: There is a 2.5 x 4.5 cm focal consolidative change at the right lung base with nodularity of the adjacent parenchyma. This may represent an area of pneumonia possibly related to aspiration. Less likely this may represent an area of pulmonary contusion given adjacent vertebral body fracture. Underlying mass or a neoplastic process is not excluded. Clinical correlation and follow-up to resolution recommended. There is advanced multi vessel coronary vascular calcification and CABG surgery. No intra-abdominal free air or free fluid. Hepatobiliary: The liver is unremarkable. Cholecystectomy with mild dilatation of the central CBD, likely post cholecystectomy. Pancreas: Unremarkable. No pancreatic ductal dilatation or surrounding inflammatory changes. Spleen: Normal in size without focal abnormality. Adrenals/Urinary Tract: The adrenal glands are unremarkable. 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 Further evaluation with direct visualization and ureteroscopy recommended. There is no hydronephrosis or nephrolithiasis on  either side. The visualized ureters appear unremarkable. There is trabeculated appearance of the bladder wall with bilateral bladder wall diverticulum measuring up to 5 cm on the left. Stomach/Bowel: There are scattered colonic diverticula without active inflammatory changes. This postsurgical changes of bowel surgery with ileocolonic anastomosis in the right upper quadrant. No evidence of bowel obstruction or active inflammation. Appendectomy. There is a small hiatal hernia with gastroesophageal reflux. There is a 4 cm distal duodenal diverticulum without active inflammatory changes. Vascular/Lymphatic: There is advanced aortoiliac atherosclerotic disease. There is a 3 cm infrarenal abdominal aortic aneurysm. There is mild narrowing of the infrarenal aorta secondary to advanced  atherosclerotic disease. No portal venous gas identified. No adenopathy. Reproductive: The prostate gland is not visualized, likely surgically absent. Other: Midline vertical anterior abdominal wall incisional scar. No fluid collection. Musculoskeletal: Osteopenia with degenerative changes of the spine. There is T12 compression fracture with approximately 50% loss of vertebral body height. This fracture appears acute. There is mild buckling of the superior posterior cortex of the T12 vertebra. No significant narrowing of the central canal. If there is neurological deficit or clinical signs of cord compression, MRI is recommended for further evaluation. There is small amount of hematoma in the adjacent perivertebral soft tissues. Median sternotomy wires noted. IMPRESSION: 1. Rounded lesion along the posterior wall of the right renal pelvis highly concerning for a neoplastic process such as TCC. Further evaluation with direct visualization and ureteroscopy recommended. 2. Acute T12 compression fracture with approximately 50% loss of vertebral body height and minimal buckling of the superior posterior cortex. No significant central canal narrowing. If there is clinical concern for cord compression, further evaluation with MRI recommended. 3. Focal area of consolidative change and adjacent parenchymal nodularity in the right lower lobe most likely representing pneumonia possibly related to aspiration. Clinical correlation is recommended. 4. Colonic and duodenal diverticulum without active inflammatory changes. No bowel obstruction or active inflammation. 5. Trabeculated bladder wall with bladder diverticula. 6.  Aortic Atherosclerosis (ICD10-I70.0). 7. A 3 cm infrarenal abdominal aortic aneurysm. Recommend followup by ultrasound in 3 years. This recommendation follows ACR consensus guidelines: White Paper of the ACR Incidental Findings Committee II on Vascular Findings. J Am Coll Radiol 2013; 34:193-790 Electronically  Signed   By: Anner Crete M.D.   On: 08/10/2017 21:54   Dg Chest 2 View  Result Date: 08/10/2017 CLINICAL DATA:  Acute onset of weakness and shortness of breath. EXAM: CHEST  2 VIEW COMPARISON:  December 24, 2016 FINDINGS: Elevation of the right hemidiaphragm. Increased opacity behind the elevated right hemidiaphragm is best seen on the lateral view, new since March 2018. The lungs are otherwise clear. The heart, hila, and mediastinum are stable. IMPRESSION: Opacity behind the right hemidiaphragm, best seen on the lateral view, could represent pneumonia or atelectasis. Recommend clinical correlation and follow-up to resolution. Electronically Signed   By: Dorise Bullion III M.D   On: 08/10/2017 20:56    Positive ROS: All other systems have been reviewed and were otherwise negative with the exception of those mentioned in the HPI and as above.  Physical Exam: General:  Alert, no acute distress Psychiatric:  Patient is competent for consent with normal mood and affect   Cardiovascular:  No pedal edema Respiratory:  No wheezing, non-labored breathing GI:  Abdomen is soft and non-tender Skin:  No lesions in the area of chief complaint Neurologic:  Sensation intact distally Lymphatic:  No axillary or cervical lymphadenopathy  Orthopedic Exam:  Orthopedic examination is limited to  the thoracolumbar spine. Skin inspection along the back is unremarkable. There is no swelling, erythema, ecchymosis, or abrasions. He does have moderate tenderness to percussion focally over the thoracolumbar junction. He is neurovascularly intact in both lower extremities, demonstrating ability to actively dorsiflex and plantarflex both knees, ankles, and toes bilaterally. Sensation is intact to light touch to all distributions. He has good cap refill to both feet.  X-rays:  A recent MRI scan of the lumbar spine is available for review. By report, the scan demonstrates evidence of an acute T12 compression fracture with  approximately 25% compression. No other acute abnormalities are identified.  Assessment: Acute T12 compression fracture.  Plan: The treatment options are discussed with the patient and his wife. The patient is quite uncomfortable as result of this fracture and finds it quite difficult for him to transfer, ambulate, or even to sit up in bed to eat due to the pain. Therefore, he would like to proceed with a kyphoplasty procedure. This procedure has been discussed with the patient and his wife. They understand that Dr. Rudene Christians will perform the procedure and that he will talk to the patient in more detail about this procedure. Meanwhile, the patient is to have his Plavix held in anticipation of this procedure.  Thank you for ask me to participate in the care of this most pleasant unfortunate man. I will be happy to speak with Dr. Rudene Christians about this case.   Pascal Lux, MD  Beeper #:  [phone]  08/11/2017 1:10 PM   Plan on kyphoplasty later in week when patient's pulmonary and renal status improved.

## 2017-08-11 NOTE — Plan of Care (Signed)
Problem: Safety: Goal: Ability to remain free from injury will improve Outcome: Progressing Pt on low bed d/t patient recently on plavix.  Problem: Pain Managment: Goal: General experience of comfort will improve Outcome: Progressing Pt experiencing little pain. States, "it only hurts when I move."  Problem: Activity: Goal: Risk for activity intolerance will decrease Outcome: Progressing Pt up to [facility] and tolerating well.

## 2017-08-11 NOTE — H&P (Signed)
[facility] NAME: [name]    MR#:  [id number]  DATE OF BIRTH:  [birth year]  DATE OF ADMISSION:  08/10/2017  PRIMARY CARE PHYSICIAN: Glean Hess, MD   REQUESTING/REFERRING PHYSICIAN:   CHIEF COMPLAINT:   Chief Complaint  Patient presents with  . Weakness  . Emesis    HISTORY OF PRESENT ILLNESS: [name]  is a 75 y.o. male with below past medical history, presents to the emergency room with a number of complaints per ED attending/documentation in the patient, the primary problem is his continued back pain from recent fall approximately a week ago resulting in T12 fracture-currently being followed/managed by Dr. Jefm Petty surgery for which is not possible evaluation for vertebroplasty by [facility] in the near future, patient states that he is unable to ambulate, has difficulty with his bowels as well as bladder function, currently was unable to [facility] placed in the emergency room for acute urinary retention, had also complained of generalized weakness/fatigue, decreased p.o. intake, hoarseness since his recent right carotid endarterectomy on 18 July 2017, since his endarterectomy the patient has had problems with swallowing/dysphagia, patient is also complaining of nausea and intermittent episodes of emesis, family currently not available but were concerned that the patient may be dehydrated, in the emergency room initial blood pressure was 90/60 with heart rate of 51-currently normal, sodium 129, potassium 5.2, bicarb 18, creatinine 5.3 with baseline 2.8-3, glucose 333, white count 11,000, EKG benign, urinalysis concerning for UTI, CT of the abdomen done noted for multiple abnormalities-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  Patient evaluated in the emergency room, in no apparent distress, resting comfortably in bed, no available family for comment, patient complaining of back pain only, Foley placed in the emergency room and draining dark urine, patient is now being admitted for acute right lower lobe pneumonia, acute urinary tract infection with associated urinary retention, subacute T12 compression fracture, abnormal CT scan of the abdomen and pelvis, acute kidney injury with chronic kidney disease, and hyperglycemia with chronic diabetes mellitus type 2.  PAST MEDICAL HISTORY:   Past Medical History:  Diagnosis Date  . Blood transfusion without reported diagnosis   . Cancer [facility])    Colon  . Coronary artery disease   . Diabetes mellitus without complication ([place])   . Dysrhythmia   . Hyperlipidemia   . Hypertension   . Myocardial infarction ([place]) 1997  . Peripheral vascular disease ([place])   . Pneumonia 2014  . Stroke ([place]) 05/2017  . Thyroid disease     PAST SURGICAL HISTORY: Past Surgical History:  Procedure Laterality Date  . APPENDECTOMY  2011  . BACK SURGERY    . CARDIAC CATHETERIZATION    . CAROTID ANGIOGRAPHY Right 06/13/2017   Procedure: Right subclavian and Carotid Angiography, possible intervention;  Surgeon: Algernon Huxley, MD;  Location: [place] CV LAB;  Service: Cardiovascular;  Laterality: Right;  . CHOLECYSTECTOMY N/A 04/23/2016   Procedure: LAPAROSCOPIC CHOLECYSTECTOMY;  Surgeon: [name], MD;  Location: [facility] ORS;  Service: General;  Laterality: N/A;  . COLON SURGERY  2011   Colectomy for ileo-cecal valve cancer  . CORONARY ARTERY BYPASS GRAFT  1997   x 3  . ENDARTERECTOMY Right 07/17/2017   Procedure: ENDARTERECTOMY CAROTID;  Surgeon: Algernon Huxley, MD;  Location: [facility] ORS;  Service: Vascular;  Laterality: Right;  . HERNIA REPAIR  2011   Ventral hernia  . KNEE SURGERY Left   . PROSTATE SURGERY  2002   BPH benign pathology   . SPINE SURGERY  1989   Lumbar disc    SOCIAL HISTORY:  Social History  Substance Use Topics  . Smoking status: Former Smoker    Types: Cigarettes    Quit date: 06/24/1996  . Smokeless tobacco: Never Used  . Alcohol use No    FAMILY HISTORY:  Family History  Problem Relation Age of Onset  . Cancer Mother        Lung  . Diabetes Mother   . Heart disease Mother   . Heart disease Father     DRUG ALLERGIES:  Allergies  Allergen Reactions  . Ace Inhibitors Other (See Comments)    Reaction:  Raises potassium   . Quinapril Rash    hyperkalemia    REVIEW OF SYSTEMS:   CONSTITUTIONAL: No fever,+ fatigue/weakness.  EYES: No blurred or double vision.  EARS, NOSE, AND THROAT: No tinnitus or ear pain.  RESPIRATORY: No cough, shortness of breath, wheezing or hemoptysis.  CARDIOVASCULAR: No chest pain, orthopnea, edema.  GASTROINTESTINAL: + nausea/vomiting, no diarrhea or abdominal pain.  GENITOURINARY: No dysuria, hematuria.  Decreased urination, inability to urinate ENDOCRINE: No polyuria, nocturia,  HEMATOLOGY: No anemia, easy bruising or bleeding SKIN: No rash or lesion. MUSCULOSKELETAL: Back pain, inability to ambulate NEUROLOGIC: No tingling, numbness, weakness.  PSYCHIATRY: No anxiety or depression.   MEDICATIONS AT HOME:  Prior to Admission medications   Medication Sig Start Date End Date Taking? Authorizing Provider  aspirin EC 81 MG EC tablet Take 1 tablet (81 mg total) by mouth daily. 07/19/17  Yes Dew, Erskine Squibb, MD  atenolol (TENORMIN) 50 MG tablet Take 1 tablet (50 mg total) by mouth daily. 08/08/17  Yes Glean Hess, MD  atorvastatin (LIPITOR) 40 MG tablet Take 1 tablet (40 mg total) by mouth daily at 6 PM. Patient taking differently: Take 40 mg by mouth daily.  06/14/17  Yes Vaughan Basta, MD  insulin NPH Human (HUMULIN N,NOVOLIN N) 100 UNIT/ML injection Inject 30 Units into the skin 2 (two) times daily.   Yes [provider]  insulin  regular (NOVOLIN R,HUMULIN R) 100 units/mL injection Inject 10-15 Units into the skin 2 (two) times daily. TAKES 15 UNITS IN THE MORNING AND 10 UNITS WITH SUPPER   Yes [provider]  levothyroxine (SYNTHROID, LEVOTHROID) 300 MCG tablet Take 300 mcg by mouth daily before breakfast.   Yes [provider]  sodium bicarbonate 650 MG tablet Take 1 tablet by mouth 2 (two) times daily. 04/28/17 04/28/18 Yes [provider]  Blood Glucose Monitoring Suppl ([place]) [place]  01/29/14   [provider]  clopidogrel (PLAVIX) 75 MG tablet Take 1 tablet (75 mg total) by mouth daily. Patient not taking: Reported on 08/10/2017 08/08/17   Glean Hess, MD  glucose blood (RELION PRIME TEST) test strip  02/20/15   [provider]  mupirocin cream (BACTROBAN) 2 % Apply 1 application topically 2 (two) times daily. Patient not taking: Reported on 07/21/2017 07/22/16   Glean Hess, MD  oxyCODONE-acetaminophen (PERCOCET) 7.5-325 MG tablet Take 1 tablet by mouth Ruiz 6 (six) hours as needed for severe pain. Patient not taking: Reported on 07/31/2017 07/28/17   Sable Feil, PA-C  oxyCODONE-acetaminophen (PERCOCET/ROXICET) 5-325 MG tablet Take 1-2 tablets by mouth Ruiz 4 (  four) hours as needed for moderate pain. Patient not taking: Reported on 08/10/2017 07/18/17   Algernon Huxley, MD      PHYSICAL EXAMINATION:   VITAL SIGNS: Blood pressure 130/76, pulse 86, temperature (!) 97.4 F (36.3 C), temperature source Oral, resp. rate 17, height 6\' 2"  (1.88 m), weight 104.3 kg (230 lb), SpO2 96 %.  GENERAL:  76 y.o.-year-old patient lying in the bed with no acute distress.  Frail-appearing EYES: Pupils equal, round, reactive to light and accommodation. No scleral icterus. Extraocular muscles intact.  HEENT: Head atraumatic, normocephalic. Oropharynx and nasopharynx clear.   NECK:  Supple, no jugular venous distention. No thyroid enlargement, no tenderness. Healing  right neck wound from recent carotid endarterectomy LUNGS: Normal breath sounds bilaterally, no wheezing, rales,rhonchi or crepitation. No use of accessory muscles of respiration.  CARDIOVASCULAR: S1, S2 normal. No murmurs, rubs, or gallops.  ABDOMEN: Soft, nontender, nondistended. Bowel sounds present. No organomegaly or mass.  EXTREMITIES: No pedal edema, cyanosis, or clubbing.  NEUROLOGIC: Cranial nerves II through XII are intact. MAES. Gait not checked.  PSYCHIATRIC: The patient is alert and oriented x 3.  SKIN: No obvious rash, lesion, or ulcer.   LABORATORY PANEL:   CBC  Recent Labs Lab 08/10/17 1958  WBC 11.3*  HGB 14.8  HCT 45.2  PLT 183  MCV 91.3  MCH 29.8  MCHC 32.6  RDW 14.3  LYMPHSABS 1.9  MONOABS 0.6  EOSABS 0.1  BASOSABS 0.1   ------------------------------------------------------------------------------------------------------------------  Chemistries   Recent Labs Lab 08/10/17 1958  NA 129*  K 5.2*  CL 100*  CO2 18*  GLUCOSE 333*  BUN 118*  CREATININE 5.37*  CALCIUM 9.0  AST 31  ALT 38  ALKPHOS 160*  BILITOT 0.8   ------------------------------------------------------------------------------------------------------------------ estimated creatinine clearance is 14.8 mL/min (A) (by C-G formula based on SCr of 5.37 mg/dL (H)). ------------------------------------------------------------------------------------------------------------------ No results for input(s): TSH, T4TOTAL, T3FREE, THYROIDAB in the last 72 hours.  Invalid input(s): FREET3   Coagulation profile No results for input(s): INR, PROTIME in the last 168 hours. ------------------------------------------------------------------------------------------------------------------- No results for input(s): DDIMER in the last 72 hours. -------------------------------------------------------------------------------------------------------------------  Cardiac Enzymes  Recent Labs Lab  08/10/17 1958  TROPONINI <0.03   ------------------------------------------------------------------------------------------------------------------ Invalid input(s): POCBNP  ---------------------------------------------------------------------------------------------------------------  Urinalysis    Component Value Date/Time   COLORURINE YELLOW (A) 08/10/2017 2155   APPEARANCEUR TURBID (A) 08/10/2017 2155   LABSPEC 1.010 08/10/2017 2155   PHURINE 5.0 08/10/2017 2155   GLUCOSEU >=500 (A) 08/10/2017 2155   HGBUR MODERATE (A) 08/10/2017 2155   BILIRUBINUR NEGATIVE 08/10/2017 2155   KETONESUR NEGATIVE 08/10/2017 2155   PROTEINUR 100 (A) 08/10/2017 2155   NITRITE NEGATIVE 08/10/2017 2155   LEUKOCYTESUR LARGE (A) 08/10/2017 2155     RADIOLOGY: Ct Abdomen Pelvis Wo Contrast  Result Date: 08/10/2017 CLINICAL DATA:  76 year old male with abdominal pain, nausea and acute renal failure. EXAM: CT ABDOMEN AND PELVIS WITHOUT CONTRAST TECHNIQUE: Multidetector CT imaging of the abdomen and pelvis was performed following the standard protocol without IV contrast. COMPARISON:  Abdominal CT dated 04/21/2016 FINDINGS: Evaluation of this exam is limited in the absence of intravenous contrast. Lower chest: There is a 2.5 x 4.5 cm focal consolidative change at the right lung base with nodularity of the adjacent parenchyma. This may represent an area of pneumonia possibly related to aspiration. Less likely this may represent an area of pulmonary contusion given adjacent vertebral body fracture. Underlying mass or a neoplastic process is not excluded. Clinical correlation and follow-up to resolution  recommended. There is advanced multi vessel coronary vascular calcification and CABG surgery. No intra-abdominal free air or free fluid. Hepatobiliary: The liver is unremarkable. Cholecystectomy with mild dilatation of the central CBD, likely post cholecystectomy. Pancreas: Unremarkable. No pancreatic ductal  dilatation or surrounding inflammatory changes. Spleen: Normal in size without focal abnormality. Adrenals/Urinary Tract: The adrenal glands are unremarkable. 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 Further evaluation with direct visualization and ureteroscopy recommended. There is no hydronephrosis or nephrolithiasis on either side. The visualized ureters appear unremarkable. There is trabeculated appearance of the bladder wall with bilateral bladder wall diverticulum measuring up to 5 cm on the left. Stomach/Bowel: There are scattered colonic diverticula without active inflammatory changes. This postsurgical changes of bowel surgery with ileocolonic anastomosis in the right upper quadrant. No evidence of bowel obstruction or active inflammation. Appendectomy. There is a small hiatal hernia with gastroesophageal reflux. There is a 4 cm distal duodenal diverticulum without active inflammatory changes. Vascular/Lymphatic: There is advanced aortoiliac atherosclerotic disease. There is a 3 cm infrarenal abdominal aortic aneurysm. There is mild narrowing of the infrarenal aorta secondary to advanced atherosclerotic disease. No portal venous gas identified. No adenopathy. Reproductive: The prostate gland is not visualized, likely surgically absent. Other: Midline vertical anterior abdominal wall incisional scar. No fluid collection. Musculoskeletal: Osteopenia with degenerative changes of the spine. There is T12 compression fracture with approximately 50% loss of vertebral body height. This fracture appears acute. There is mild buckling of the superior posterior cortex of the T12 vertebra. No significant narrowing of the central canal. If there is neurological deficit or clinical signs of cord compression, MRI is recommended for further evaluation. There is small amount of hematoma in the adjacent  perivertebral soft tissues. Median sternotomy wires noted. IMPRESSION: 1. Rounded lesion along the posterior wall of the right renal pelvis highly concerning for a neoplastic process such as TCC. Further evaluation with direct visualization and ureteroscopy recommended. 2. Acute T12 compression fracture with approximately 50% loss of vertebral body height and minimal buckling of the superior posterior cortex. No significant central canal narrowing. If there is clinical concern for cord compression, further evaluation with MRI recommended. 3. Focal area of consolidative change and adjacent parenchymal nodularity in the right lower lobe most likely representing pneumonia possibly related to aspiration. Clinical correlation is recommended. 4. Colonic and duodenal diverticulum without active inflammatory changes. No bowel obstruction or active inflammation. 5. Trabeculated bladder wall with bladder diverticula. 6.  Aortic Atherosclerosis (ICD10-I70.0). 7. A 3 cm infrarenal abdominal aortic aneurysm. Recommend followup by ultrasound in 3 years. This recommendation follows ACR consensus guidelines: White Paper of the ACR Incidental Findings Committee II on Vascular Findings. J Am Coll Radiol 2013; 25:852-778 Electronically Signed   By: Anner Crete M.D.   On: 08/10/2017 21:54   Dg Chest 2 View  Result Date: 08/10/2017 CLINICAL DATA:  Acute onset of weakness and shortness of breath. EXAM: CHEST  2 VIEW COMPARISON:  December 24, 2016 FINDINGS: Elevation of the right hemidiaphragm. Increased opacity behind the elevated right hemidiaphragm is best seen on the lateral view, new since March 2018. The lungs are otherwise clear. The heart, hila, and mediastinum are stable. IMPRESSION: Opacity behind the right hemidiaphragm, best seen on the lateral view, could represent pneumonia or atelectasis. Recommend clinical correlation and follow-up to resolution. Electronically Signed   By: Dorise Bullion III M.D  On: 08/10/2017  20:56    EKG: Orders placed or performed during the hospital encounter of 08/10/17  . EKG 12-Lead  . EKG 12-Lead    IMPRESSION AND PLAN: 1 acute right lower lobe pneumonia Most likely secondary to aspiration given problems with swallowing since right carotid endarterectomy July 18, 2017 Continue Zosyn, aspiration precautions, speech therapy to evaluate/treat, head of the bed at 30 degrees at all times, IV fluids for rehydration, and follow-up on cultures  2 acute urinary retention  Compounded by abnormal CT scan of the abdomen/pelvis noted for right renal pelvic lesion concerning for  TCC Antibiotics per above, follow-up on cultures, and consult urology for expert opinion  3 acute abnormal CT scan of the abdomen/pelvis Concerning for right renal pelvic lesion/ TTC Plan of care as stated above  4 acute/subacute T12 compression fraction Noted 50% loss of height Occurred over a week ago, followed by Dr. Jefm Petty surgery -consulted given concern for problems with bowel/bladder functioning, Foley placed for urinary retention, plans for kyphoplasty evaluation by Dr. Rudene Christians  5 acute hyperglycemia with chronic diabetes mellitus type 2 Most likely exacerbated by pneumonia and a urinary tract infection Continue home insulin regimen, start sliding scale insulin with Accu-Cheks per routine, check hemoglobin A1c determine level of control, cardiac/ADA diet, IV fluids for rehydration, make changes as per necessary  6 acute urinary tract infection Antibiotics per above, IV fluids for rehydration, follow-up on cultures  7 acute kidney injury/acute renal failure with CKD Baseline creatinine between 2.8-3 Most likely secondary to acute dehydration/poor p.o. intake Avoid nephrotoxic agents, strict I&O monitoring, BMP daily, IV fluids for rehydration, and consider nephrology consultation if any further worsening  8 acute probable dysphagia Problems with swallowing since right carotid  endarterectomy on July 18, 2017 Plan of care as stated above  DNR per patient's wishes Condition stable Prognosis poor DVT prophylaxis with heparin subcu Disposition pending clinical course, may benefit from rehab placement    All the records are reviewed and case discussed with ED provider. Management plans discussed with the patient, family and they are in agreement.  CODE STATUS: Code Status History    Date Active Date Inactive Code Status Order ID Comments User Context   07/17/2017  6:37 PM 07/18/2017  6:55 PM Full Code [id number]  Algernon Huxley, MD Inpatient   06/11/2017 10:31 PM 06/14/2017  4:38 PM Full Code [id number]  Harvie Bridge, DO Inpatient   12/25/2016  3:31 PM 12/26/2016  4:31 PM DNR [id number]  Hillary Bow, MD ED   04/23/2016  1:20 PM 04/26/2016 10:17 PM Full Code [id number]  Jeanie Cooks, MD Inpatient   04/21/2016  2:13 AM 04/23/2016  1:20 PM Full Code [id number]  Gonczy, [place], MD ED       TOTAL TIME TAKING CARE OF THIS PATIENT: 50 minutes.    Avel Peace Salary M.D on 08/11/2017   Between 7am to 6pm - Pager - [phone]  After 6pm go to [email or web address] - password [email or web address] [facility]  Office  [phone]  CC: Primary care physician; Glean Hess, MD   Note: This dictation was prepared with Dragon dictation along with smaller phrase technology. Any transcriptional errors that result from this process are unintentional.

## 2017-08-11 NOTE — Progress Notes (Signed)
Blood pressure has not decreased.  Spoke with Dr Estanislado Pandy and given prn orders.  IV hydralazine given for blood pressure 182/90. Dorna Bloom RN

## 2017-08-11 NOTE — ED Notes (Signed)
Admitting dr at bedside.  

## 2017-08-11 NOTE — Consult Note (Signed)
GU consult re: urinary retention, acute on chronic renal failure and right renal pelvic mass. Chart, labs, imaging reviewed. No hydro on CT. Discussed pt with nurse. Foley in place. Draining well. Good UOP since placement. Full consult to follow. Continue foley. He'll need ureteroscopy at a later date.

## 2017-08-11 NOTE — ED Notes (Signed)
Phone numbers obtained from daughter and wife, daughter Delsa Sale [phone] Wife [name] [phone], will return in the am round [phone]

## 2017-08-11 NOTE — ED Notes (Signed)
Report given to floor, low bed being brought to room will call back when it has arrived

## 2017-08-11 NOTE — Progress Notes (Signed)
[place] [facility] [place] NAME: [name]    MR#:  [id number]  DATE OF BIRTH:  08-15-1941  SUBJECTIVE:  CHIEF COMPLAINT:   Chief Complaint  Patient presents with  . Weakness  . Emesis     HAd a fall and vertebral fracture, Not much mobile now. More weak. Found to have pneumonia and Renal failure with UTI.  REVIEW OF SYSTEMS:   CONSTITUTIONAL: No fever,+ fatigue/weakness.  EYES: No blurred or double vision.  EARS, NOSE, AND THROAT: No tinnitus or ear pain.  RESPIRATORY: No cough, shortness of breath, wheezing or hemoptysis.  CARDIOVASCULAR: No chest pain, orthopnea, edema.  GASTROINTESTINAL: + nausea/vomiting, no diarrhea or abdominal pain.  GENITOURINARY: No dysuria, hematuria.  Decreased urination, inability to urinate ENDOCRINE: No polyuria, nocturia,  HEMATOLOGY: No anemia, easy bruising or bleeding SKIN: No rash or lesion. MUSCULOSKELETAL: Back pain, inability to ambulate NEUROLOGIC: No tingling, numbness, weakness.  PSYCHIATRY: No anxiety or depression.   ROS  DRUG ALLERGIES:   Allergies  Allergen Reactions  . Ace Inhibitors Other (See Comments)    Reaction:  Raises potassium   . Quinapril Rash    hyperkalemia    VITALS:  Blood pressure (!) 155/81, pulse 82, temperature 98 F (36.7 C), temperature source Oral, resp. rate 18, height 6\' 2"  (1.88 m), weight 92.5 kg (204 lb), SpO2 97 %.  PHYSICAL EXAMINATION:   GENERAL:  76 y.o.-year-old patient lying in the bed with no acute distress.  Frail-appearing EYES: Pupils equal, round, reactive to light and accommodation. No scleral icterus. Extraocular muscles intact.  HEENT: Head atraumatic, normocephalic. Oropharynx and nasopharynx clear.   NECK:  Supple, no jugular venous distention. No thyroid enlargement, no tenderness. Healing right neck wound from recent carotid endarterectomy LUNGS: Normal breath sounds bilaterally, no wheezing, rales,rhonchi or crepitation. No use of  accessory muscles of respiration.  CARDIOVASCULAR: S1, S2 normal. No murmurs, rubs, or gallops.  ABDOMEN: Soft, nontender, nondistended. Bowel sounds present. No organomegaly or mass.  EXTREMITIES: No pedal edema, cyanosis, or clubbing.  NEUROLOGIC: Cranial nerves II through XII are intact. Power 3/5. Gait not checked.  PSYCHIATRIC: The patient is alert and oriented x 3.  SKIN: No obvious rash, lesion, or ulcer.  Physical Exam LABORATORY PANEL:   CBC  Recent Labs Lab 08/10/17 1958  WBC 11.3*  HGB 14.8  HCT 45.2  PLT 183   ------------------------------------------------------------------------------------------------------------------  Chemistries   Recent Labs Lab 08/10/17 1958 08/11/17 0340  NA 129* 133*  K 5.2* 5.3*  CL 100* 106  CO2 18* 21*  GLUCOSE 333* 292*  BUN 118* 97*  CREATININE 5.37* 4.59*  CALCIUM 9.0 8.4*  AST 31  --   ALT 38  --   ALKPHOS 160*  --   BILITOT 0.8  --    ------------------------------------------------------------------------------------------------------------------  Cardiac Enzymes  Recent Labs Lab 08/10/17 1958  TROPONINI <0.03   ------------------------------------------------------------------------------------------------------------------  RADIOLOGY:  Ct Abdomen Pelvis Wo Contrast  Result Date: 08/10/2017 CLINICAL DATA:  76 year old male with abdominal pain, nausea and acute renal failure. EXAM: CT ABDOMEN AND PELVIS WITHOUT CONTRAST TECHNIQUE: Multidetector CT imaging of the abdomen and pelvis was performed following the standard protocol without IV contrast. COMPARISON:  Abdominal CT dated 04/21/2016 FINDINGS: Evaluation of this exam is limited in the absence of intravenous contrast. Lower chest: There is a 2.5 x 4.5 cm focal consolidative change at the right lung base with nodularity of the adjacent parenchyma. This may represent an area of pneumonia possibly related to  aspiration. Less likely this may represent an area of  pulmonary contusion given adjacent vertebral body fracture. Underlying mass or a neoplastic process is not excluded. Clinical correlation and follow-up to resolution recommended. There is advanced multi vessel coronary vascular calcification and CABG surgery. No intra-abdominal free air or free fluid. Hepatobiliary: The liver is unremarkable. Cholecystectomy with mild dilatation of the central CBD, likely post cholecystectomy. Pancreas: Unremarkable. No pancreatic ductal dilatation or surrounding inflammatory changes. Spleen: Normal in size without focal abnormality. Adrenals/Urinary Tract: The adrenal glands are unremarkable. 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 Further evaluation with direct visualization and ureteroscopy recommended. There is no hydronephrosis or nephrolithiasis on either side. The visualized ureters appear unremarkable. There is trabeculated appearance of the bladder wall with bilateral bladder wall diverticulum measuring up to 5 cm on the left. Stomach/Bowel: There are scattered colonic diverticula without active inflammatory changes. This postsurgical changes of bowel surgery with ileocolonic anastomosis in the right upper quadrant. No evidence of bowel obstruction or active inflammation. Appendectomy. There is a small hiatal hernia with gastroesophageal reflux. There is a 4 cm distal duodenal diverticulum without active inflammatory changes. Vascular/Lymphatic: There is advanced aortoiliac atherosclerotic disease. There is a 3 cm infrarenal abdominal aortic aneurysm. There is mild narrowing of the infrarenal aorta secondary to advanced atherosclerotic disease. No portal venous gas identified. No adenopathy. Reproductive: The prostate gland is not visualized, likely surgically absent. Other: Midline vertical anterior abdominal wall incisional scar. No fluid collection.  Musculoskeletal: Osteopenia with degenerative changes of the spine. There is T12 compression fracture with approximately 50% loss of vertebral body height. This fracture appears acute. There is mild buckling of the superior posterior cortex of the T12 vertebra. No significant narrowing of the central canal. If there is neurological deficit or clinical signs of cord compression, MRI is recommended for further evaluation. There is small amount of hematoma in the adjacent perivertebral soft tissues. Median sternotomy wires noted. IMPRESSION: 1. Rounded lesion along the posterior wall of the right renal pelvis highly concerning for a neoplastic process such as TCC. Further evaluation with direct visualization and ureteroscopy recommended. 2. Acute T12 compression fracture with approximately 50% loss of vertebral body height and minimal buckling of the superior posterior cortex. No significant central canal narrowing. If there is clinical concern for cord compression, further evaluation with MRI recommended. 3. Focal area of consolidative change and adjacent parenchymal nodularity in the right lower lobe most likely representing pneumonia possibly related to aspiration. Clinical correlation is recommended. 4. Colonic and duodenal diverticulum without active inflammatory changes. No bowel obstruction or active inflammation. 5. Trabeculated bladder wall with bladder diverticula. 6.  Aortic Atherosclerosis (ICD10-I70.0). 7. A 3 cm infrarenal abdominal aortic aneurysm. Recommend followup by ultrasound in 3 years. This recommendation follows ACR consensus guidelines: White Paper of the ACR Incidental Findings Committee II on Vascular Findings. J Am Coll Radiol 2013; 16:010-932 Electronically Signed   By: Anner Crete M.D.   On: 08/10/2017 21:54   Dg Chest 2 View  Result Date: 08/10/2017 CLINICAL DATA:  Acute onset of weakness and shortness of breath. EXAM: CHEST  2 VIEW COMPARISON:  December 24, 2016 FINDINGS: Elevation  of the right hemidiaphragm. Increased opacity behind the elevated right hemidiaphragm is best seen on the lateral view, new since March 2018. The lungs are otherwise clear. The heart, hila, and mediastinum are stable. IMPRESSION: Opacity behind  the right hemidiaphragm, best seen on the lateral view, could represent pneumonia or atelectasis. Recommend clinical correlation and follow-up to resolution. Electronically Signed   By: Dorise Bullion III M.D   On: 08/10/2017 20:56    ASSESSMENT AND PLAN:   Active Problems:   CAP (community acquired pneumonia)  1 acute right lower lobe pneumonia Most likely secondary to aspiration given problems with swallowing since right carotid endarterectomy July 18, 2017 Continue Zosyn, aspiration precautions, speech therapy to evaluate/treat, head of the bed at 30 degrees at all times, IV fluids for rehydration, and follow-up on cultures  2 acute urinary retention with Ac on ch renal failure. Compounded by abnormal CT scan of the abdomen/pelvis noted for right renal pelvic lesion concerning for  TCC Antibiotics per above, follow-up on cultures, and consult urology for expert opinion  3 acute abnormal CT scan of the abdomen/pelvis Concerning for right renal pelvic lesion/ TTC Plan of care as stated above  4 acute/subacute T12 compression fraction Noted 50% loss of height Occurred over a week ago,   Foley placed for urinary retention, plans for kyphoplasty by Dr. Rudene Christians  5 acute hyperglycemia with chronic diabetes mellitus type 2 Most likely exacerbated by pneumonia and a urinary tract infection Continue home insulin regimen, start sliding scale insulin with Accu-Cheks per routine, check hemoglobin A1c determine level of control, cardiac/ADA diet, IV fluids for rehydration, make changes as per necessary  6 acute urinary tract infection Antibiotics per above, IV fluids for rehydration, follow-up on cultures  7 acute kidney injury/acute renal failure  with CKD Baseline creatinine between 2.8-3 Most likely secondary to acute dehydration/poor p.o. intake Avoid nephrotoxic agents, strict I&O monitoring, BMP daily, IV fluids for rehydration, and consider nephrology consultation if any further worsening  8 acute probable dysphagia Problems with swallowing since right carotid endarterectomy on July 18, 2017 Plan of care as stated above   All the records are reviewed and case discussed with Care Management/Social Workerr. Management plans discussed with the patient, family and they are in agreement.  CODE STATUS: DNR  TOTAL TIME TAKING CARE OF THIS PATIENT: 45 minutes.     POSSIBLE D/C IN 3-4 DAYS, DEPENDING ON CLINICAL CONDITION.   Vaughan Basta M.D on 08/11/2017   Between 7am to 6pm - Pager - [phone]  After 6pm go to [email or web address] - password [email or web address] [facility]  Office  [phone]  CC: Primary care physician; Glean Hess, MD  Note: This dictation was prepared with Dragon dictation along with smaller phrase technology. Any transcriptional errors that result from this process are unintentional.

## 2017-08-11 NOTE — Evaluation (Addendum)
Objective Swallowing Evaluation:  Modified Barium Swallow Study(MBSS) Patient Details  Name: [name] MRN: [id number] Date of Birth: 10-29-1940  Today's Date: 08/11/2017 Time: SLP Start Time (ACUTE ONLY): 1130-SLP Stop Time (ACUTE ONLY): 1230 SLP Time Calculation (min) (ACUTE ONLY): 60 min  Past Medical History:  Past Medical History:  Diagnosis Date  . Blood transfusion without reported diagnosis   . Cancer [facility])    Colon  . Coronary artery disease   . Diabetes mellitus without complication ([place])   . Dysrhythmia   . Hyperlipidemia   . Hypertension   . Myocardial infarction ([place]) 1997  . Peripheral vascular disease ([place])   . Pneumonia 2014  . Stroke ([place]) 05/2017  . Thyroid disease    Past Surgical History:  Past Surgical History:  Procedure Laterality Date  . APPENDECTOMY  2011  . BACK SURGERY    . CARDIAC CATHETERIZATION    . CAROTID ANGIOGRAPHY Right 06/13/2017   Procedure: Right subclavian and Carotid Angiography, possible intervention;  Surgeon: Algernon Huxley, MD;  Location: [facility];  Service: Cardiovascular;  Laterality: Right;  . CHOLECYSTECTOMY N/A 04/23/2016   Procedure: LAPAROSCOPIC CHOLECYSTECTOMY;  Surgeon: [name], MD;  Location: [facility] ORS;  Service: General;  Laterality: N/A;  . COLON SURGERY  2011   Colectomy for ileo-cecal valve cancer  . CORONARY ARTERY BYPASS GRAFT  1997   x 3  . ENDARTERECTOMY Right 07/17/2017   Procedure: ENDARTERECTOMY CAROTID;  Surgeon: Algernon Huxley, MD;  Location: [facility] ORS;  Service: Vascular;  Laterality: Right;  . HERNIA REPAIR  2011   Ventral hernia  . KNEE SURGERY Left   . PROSTATE SURGERY  2002   BPH benign pathology  . SPINE SURGERY  1989   Lumbar disc   HPI: Pt is a 76 y.o. male with below past medical history, presents to the emergency room with a number of complaints per ED attending/documentation in the patient, the primary problem is his continued back pain from recent fall approximately a week ago  resulting in T12 fracture-currently being followed/managed by Dr. Jefm Petty surgery for which is not possible evaluation for vertebroplasty by [facility] in the near future, patient states that he is unable to ambulate, has difficulty with his bowels as well as bladder function, currently was unable to [facility] placed in the emergency room for acute urinary retention, had also complained of generalized weakness/fatigue, decreased p.o. intake, hoarseness since his recent right carotid endarterectomy on 18 July 2017, since his endarterectomy the patient has had problems with swallowing/dysphagia, patient is also complaining of nausea and intermittent episodes of emesis, family currently not available but were concerned that the patient may be dehydrated, in the emergency room initial blood pressure was 90/60 with heart rate of 51-currently normal, sodium 129, potassium 5.2, bicarb 18, creatinine 5.3 with baseline 2.8-3, glucose 333, white count 11,000, EKG benign, urinalysis concerning for UTI, CT of the abdomen done noted for multiple abnormalities-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   Subjective: pt awake, verbally conversive. Noted dysphonia which pt stated he had since his endarterectomy.    Assessment / Plan / Recommendation  CHL IP CLINICAL IMPRESSIONS 08/11/2017  Clinical Impression Pt appears to present w/ an adequate oropharyngeal phase swallow function c/b adequate oral phase bolus management and timely pharyngeal swallow initation during the pharyngeal  phase. No aspiration was noted during this exam; the trace-slight laryngeal penetration noted w/ thin liquids was inconsistent and w/ larger bolus sips. When pt used smaller sips of thin  liquids (via cup), less piecemealing was noted and no laryngeal penetration was noted. Pt exhibited appropriate bolus control w/ timely A-P transfer, timely swallowing, and appropriate oral clearing post swallow. No pharyngeal residue noted post swallows w/ all consistencies given indicating adequate pharyngeal pressure and laryngeal excursion during the swallowing. Of note, noted what appeared to be edema/mass around epiglottis slightly obscuring the epiglottis was well - it did not appear to impact the ability to attain airway closure during the swallowing, but suspect it could have initially post surgery/extubation which may have contributed to pt's dysphagia and pulmonary issues currently. Strongly recommend ENT follow for direct view of the laryngopharyngeal areas when appropriate (also because of pt's change in vocal quality as well).   SLP Visit Diagnosis Dysphagia, unspecified (R13.10)  Attention and concentration deficit following --  Frontal lobe and executive function deficit following --  Impact on safety and function (No Data)      CHL IP TREATMENT RECOMMENDATION 08/11/2017  Treatment Recommendations Therapy as outlined in treatment plan below     Prognosis 08/11/2017  Prognosis for Safe Diet Advancement Good  Barriers to Reach Goals (No Data)  Barriers/Prognosis Comment --    CHL IP DIET RECOMMENDATION 08/11/2017  SLP Diet Recommendations Thin liquid;Dysphagia 3 (Mech soft) solids;Regular solids  Liquid Administration via Cup;No straw  Medication Administration Whole meds with puree  Compensations Minimize environmental distractions;Slow rate;Small sips/bites;Lingual sweep for clearance of pocketing;Multiple dry swallows after each bite/sip;Follow solids with liquid  Postural Changes Remain semi-upright after after feeds/meals (Comment);Seated upright at 90 degrees      CHL IP OTHER RECOMMENDATIONS 08/11/2017  Recommended Consults (No Data)  Oral Care Recommendations Oral care  BID;Patient independent with oral care;Staff/trained caregiver to provide oral care  Other Recommendations (No Data)      CHL IP FOLLOW UP RECOMMENDATIONS 08/11/2017  Follow up Recommendations None      CHL IP FREQUENCY AND DURATION 08/11/2017  Speech Therapy Frequency (ACUTE ONLY) min 2x/week  Treatment Duration 1 week           CHL IP ORAL PHASE 08/11/2017  Oral Phase WFL  Oral - Pudding Teaspoon --  Oral - Pudding Cup --  Oral - Honey Teaspoon --  Oral - Honey Cup --  Oral - Nectar Teaspoon --  Oral - Nectar Cup --  Oral - Nectar Straw --  Oral - Thin Teaspoon --  Oral - Thin Cup --  Oral - Thin Straw --  Oral - Puree --  Oral - Mech Soft --  Oral - Regular --  Oral - Multi-Consistency --  Oral - Pill --  Oral Phase - Comment --    CHL IP PHARYNGEAL PHASE 08/11/2017  Pharyngeal Phase WFL  Pharyngeal- Pudding Teaspoon --  Pharyngeal --  Pharyngeal- Pudding Cup --  Pharyngeal --  Pharyngeal- Honey Teaspoon --  Pharyngeal --  Pharyngeal- Honey Cup --  Pharyngeal --  Pharyngeal- Nectar Teaspoon --  Pharyngeal --  Pharyngeal- Nectar Cup --  Pharyngeal --  Pharyngeal- Nectar Straw --  Pharyngeal --  Pharyngeal- Thin Teaspoon --  Pharyngeal --  Pharyngeal- Thin Cup --  Pharyngeal --  Pharyngeal- Thin Straw --  Pharyngeal --  Pharyngeal- Puree --  Pharyngeal --  Pharyngeal- Mechanical Soft --  Pharyngeal --  Pharyngeal- Regular --  Pharyngeal --  Pharyngeal-  Multi-consistency --  Pharyngeal --  Pharyngeal- Pill --  Pharyngeal --  Pharyngeal Comment --     CHL IP CERVICAL ESOPHAGEAL PHASE 08/11/2017  Cervical Esophageal Phase WFL  Pudding Teaspoon --  Pudding Cup --  Honey Teaspoon --  Honey Cup --  Nectar Teaspoon --  Nectar Cup --  Nectar Straw --  Thin Teaspoon --  Thin Cup --  Thin Straw --  Puree --  Mechanical Soft --  Regular --  Multi-consistency --  Pill --  Cervical Esophageal Comment --    CHL IP GO 08/11/2017  Functional  Assessment Tool Used MBSS  Functional Limitations Swallowing  Swallow Current Status (T0177) CI  Swallow Goal Status (L3903) CI  Swallow Discharge Status (E0923) CI  Motor Speech Current Status (R0076) (None)  Motor Speech Goal Status (A2633) (None)  Motor Speech Goal Status (H5456) (None)  Spoken Language Comprehension Current Status (Y5638) (None)  Spoken Language Comprehension Goal Status (L3734) (None)  Spoken Language Comprehension Discharge Status (K8768) (None)  Spoken Language Expression Current Status (T1572) (None)  Spoken Language Expression Goal Status (I2035) (None)  Spoken Language Expression Discharge Status [phone]) (None)  Attention Current Status (U3845) (None)  Attention Goal Status (X6468) (None)  Attention Discharge Status (E3212) (None)  Memory Current Status (Y4825) (None)  Memory Goal Status (O0370) (None)  Memory Discharge Status (W8889) (None)  Voice Current Status (V6945) (None)  Voice Goal Status (W3888) (None)  Voice Discharge Status (K8003) (None)  Other Speech-Language Pathology Functional Limitation Current Status (K9179) (None)  Other Speech-Language Pathology Functional Limitation Goal Status (X5056) (None)  Other Speech-Language Pathology Functional Limitation Discharge Status [id number]) (None)     Orinda Kenner, MS, CCC-SLP Gaynel Schaafsma 08/11/2017, 1:52 PM

## 2017-08-12 DIAGNOSIS — R338 Other retention of urine: Secondary | ICD-10-CM

## 2017-08-12 DIAGNOSIS — N2889 Other specified disorders of kidney and ureter: Secondary | ICD-10-CM

## 2017-08-12 LAB — CBC
HEMATOCRIT: 40.1 % (ref 40.0–52.0)
Hemoglobin: 13.3 g/dL (ref 13.0–18.0)
MCH: 30.3 pg (ref 26.0–34.0)
MCHC: 33.1 g/dL (ref 32.0–36.0)
MCV: 91.5 fL (ref 80.0–100.0)
PLATELETS: 135 10*3/uL — AB (ref 150–440)
RBC: 4.38 MIL/uL — ABNORMAL LOW (ref 4.40–5.90)
RDW: 14.5 % (ref 11.5–14.5)
WBC: 6.9 10*3/uL (ref 3.8–10.6)

## 2017-08-12 LAB — BASIC METABOLIC PANEL
Anion gap: 6 (ref 5–15)
BUN: 79 mg/dL — AB (ref 6–20)
CALCIUM: 8.2 mg/dL — AB (ref 8.9–10.3)
CO2: 20 mmol/L — AB (ref 22–32)
Chloride: 110 mmol/L (ref 101–111)
Creatinine, Ser: 4.11 mg/dL — ABNORMAL HIGH (ref 0.61–1.24)
GFR calc Af Amer: 15 mL/min — ABNORMAL LOW (ref >60)
GFR, EST NON AFRICAN AMERICAN: 13 mL/min — AB (ref >60)
GLUCOSE: 164 mg/dL — AB (ref 65–99)
POTASSIUM: 4.8 mmol/L (ref 3.5–5.1)
Sodium: 136 mmol/L (ref 135–145)

## 2017-08-12 LAB — GLUCOSE, CAPILLARY
GLUCOSE-CAPILLARY: 161 mg/dL — AB (ref 65–99)
GLUCOSE-CAPILLARY: 218 mg/dL — AB (ref 65–99)
Glucose-Capillary: 153 mg/dL — ABNORMAL HIGH (ref 65–99)
Glucose-Capillary: 169 mg/dL — ABNORMAL HIGH (ref 65–99)
Glucose-Capillary: 160 mg/dL — ABNORMAL HIGH (ref 65–99)

## 2017-08-12 MED ORDER — INSULIN DETEMIR 100 UNIT/ML ~~LOC~~ SOLN
25.0000 [IU] | Freq: Two times a day (BID) | SUBCUTANEOUS | Status: DC
  Administered 2017-08-12 – 2017-08-15 (×6): 25 [IU] via SUBCUTANEOUS
  Filled 2017-08-12 (×7): qty 0.25

## 2017-08-12 MED ORDER — INSULIN ASPART 100 UNIT/ML ~~LOC~~ SOLN
5.0000 [IU] | Freq: Every day | SUBCUTANEOUS | Status: DC
  Administered 2017-08-12 – 2017-08-14 (×3): 5 [IU] via SUBCUTANEOUS
  Filled 2017-08-12 (×3): qty 1

## 2017-08-12 MED ORDER — INSULIN ASPART 100 UNIT/ML ~~LOC~~ SOLN
10.0000 [IU] | Freq: Every day | SUBCUTANEOUS | Status: DC
  Administered 2017-08-13 – 2017-08-15 (×2): 10 [IU] via SUBCUTANEOUS
  Filled 2017-08-12 (×2): qty 1

## 2017-08-12 NOTE — Consult Note (Signed)
Consult: urinary retention, right renal pelvic mass Requested by: Dr. Doylene Canning  History of Present Illness: 76 year old white male admitted for complaints of back pain and difficulty urinating.  He underwent a right carotid endarterectomy at the beginning of the month and then fell resulting in a fracture of T12 a few weeks ago.  He has a procedure for the back scheduled in 2 days.  Over the month he has had progressive weak stream and hesitancy and feeling of incomplete emptying.  He underwent CT scan which showed a distended bladder and small bilateral bladder diverticula and a small prostate.  He also had acute kidney injury which looked to be prerenal.  Baseline creatinine around 2.8 or 3.  Patient reports what sounds like an open simple prostatectomy in the mid 90s which is likely why the prostate looks small on the CT yet he has diverticula.  A Foley catheter was placed and he is made good urine now that he has been resuscitated.  He is on tamsulosin.  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  Patient recalls gross hematuria after starting Plavix following the carotid endarterectomy earlier this month.  Urine was red.  He has not had clots.  Urine is light pink currently.    Past Medical History:  Diagnosis Date  . Blood transfusion without reported diagnosis   . Cancer [facility])    Colon  . Coronary artery disease   . Diabetes mellitus without complication ([place])   . Dysrhythmia   . Hyperlipidemia   . Hypertension   . Myocardial infarction ([place]) 1997  . Peripheral vascular disease ([place])   . Pneumonia 2014  . Stroke ([place]) 05/2017  . Thyroid disease    Past Surgical History:  Procedure Laterality Date  . APPENDECTOMY  2011  . BACK SURGERY    . CARDIAC CATHETERIZATION    . CAROTID ANGIOGRAPHY Right 06/13/2017   Procedure: Right subclavian and Carotid Angiography, possible intervention;  Surgeon: Algernon Huxley, MD;  Location: [place] [facility];  Service:  Cardiovascular;  Laterality: Right;  . CHOLECYSTECTOMY N/A 04/23/2016   Procedure: LAPAROSCOPIC CHOLECYSTECTOMY;  Surgeon: [name], MD;  Location: [facility];  Service: General;  Laterality: N/A;  . COLON SURGERY  2011   Colectomy for ileo-cecal valve cancer  . CORONARY ARTERY BYPASS GRAFT  1997   x 3  . ENDARTERECTOMY Right 07/17/2017   Procedure: ENDARTERECTOMY CAROTID;  Surgeon: Algernon Huxley, MD;  Location: [facility];  Service: Vascular;  Laterality: Right;  . HERNIA REPAIR  2011   Ventral hernia  . KNEE SURGERY Left   . PROSTATE SURGERY  2002   BPH benign pathology  . SPINE SURGERY  1989   Lumbar disc    Home Medications:  Prescriptions Prior to Admission  Medication Sig Dispense Refill Last Dose  . aspirin EC 81 MG EC tablet Take 1 tablet (81 mg total) by mouth daily. 90 tablet 3 08/09/2017 at 0800  . atenolol (TENORMIN) 50 MG tablet Take 1 tablet (50 mg total) by mouth daily. 30 tablet 5 08/09/2017 at 0800  . atorvastatin (LIPITOR) 40 MG tablet Take 1 tablet (40 mg total) by mouth daily at 6 PM. (Patient taking differently: Take 40 mg by mouth daily. ) 30 tablet 0 08/09/2017 at 0800  . insulin NPH Human (HUMULIN N,NOVOLIN N) 100 UNIT/ML injection Inject 30 Units into the skin 2 (two) times daily.   08/09/2017 at Unknown time  . insulin  regular (NOVOLIN R,HUMULIN R) 100 units/mL injection Inject 10-15 Units into the skin 2 (two) times daily. TAKES 15 UNITS IN THE MORNING AND 10 UNITS WITH SUPPER   08/09/2017 at Unknown time  . levothyroxine (SYNTHROID, LEVOTHROID) 300 MCG tablet Take 300 mcg by mouth daily before breakfast.   08/09/2017 at 0800  . sodium bicarbonate 650 MG tablet Take 1 tablet by mouth 2 (two) times daily.   08/09/2017 at Unknown time  . Blood Glucose Monitoring Suppl (RELION PRIME MONITOR) DEVI    Taking  . clopidogrel (PLAVIX) 75 MG tablet Take 1 tablet (75 mg total) by mouth daily. (Patient not taking: Reported on 08/10/2017) 30 tablet 5 Not Taking at Unknown  time  . glucose blood (RELION PRIME TEST) test strip    Taking  . mupirocin cream (BACTROBAN) 2 % Apply 1 application topically 2 (two) times daily. (Patient not taking: Reported on 07/21/2017) 30 g 1 Not Taking at Unknown time  . oxyCODONE-acetaminophen (PERCOCET) 7.5-325 MG tablet Take 1 tablet by mouth every 6 (six) hours as needed for severe pain. (Patient not taking: Reported on 07/31/2017) 12 tablet 0 Not Taking at Unknown time  . oxyCODONE-acetaminophen (PERCOCET/ROXICET) 5-325 MG tablet Take 1-2 tablets by mouth every 4 (four) hours as needed for moderate pain. (Patient not taking: Reported on 08/10/2017) 30 tablet 0 Not Taking at Unknown time   Allergies:  Allergies  Allergen Reactions  . Ace Inhibitors Other (See Comments)    Reaction:  Raises potassium   . Quinapril Rash    hyperkalemia    Family History  Problem Relation Age of Onset  . Cancer Mother        Lung  . Diabetes Mother   . Heart disease Mother   . Heart disease Father    Social History:  reports that he quit smoking about 21 years ago. His smoking use included Cigarettes. He has never used smokeless tobacco. He reports that he does not drink alcohol or use drugs.  ROS: A complete review of systems was performed.  All systems are negative except for pertinent findings as noted. Review of Systems  All other systems reviewed and are negative.    Physical Exam:  Vital signs in last 24 hours: Temp:  [97.6 F (36.4 C)-97.7 F (36.5 C)] 97.6 F (36.4 C) (10/30 0416) Pulse Rate:  [63-82] 67 (10/30 0416) Resp:  [18] 18 (10/30 0416) BP: (115-158)/(67-81) 158/74 (10/30 0416) SpO2:  [95 %-97 %] 95 % (10/30 0416) General:  Alert and oriented, No acute distress HEENT: Normocephalic, atraumatic Neck: No JVD or lymphadenopathy Cardiovascular: Regular rate and rhythm Lungs: Regular rate and effort Abdomen: Soft, nontender, nondistended, no abdominal masses, midline lower abdominal scar Back: No CVA  tenderness Extremities: No edema Neurologic: Grossly intact GU: Urine light pink  Laboratory Data:  Results for orders placed or performed during the hospital encounter of 08/10/17 (from the past 24 hour(s))  Glucose, capillary     Status: Abnormal   Collection Time: 08/11/17 12:23 PM  Result Value Ref Range   Glucose-Capillary 120 (H) 65 - 99 mg/dL  Glucose, capillary     Status: None   Collection Time: 08/11/17  4:58 PM  Result Value Ref Range   Glucose-Capillary 90 65 - 99 mg/dL  Glucose, capillary     Status: Abnormal   Collection Time: 08/11/17  9:03 PM  Result Value Ref Range   Glucose-Capillary 164 (H) 65 - 99 mg/dL  CBC     Status: Abnormal  Collection Time: 08/12/17  5:11 AM  Result Value Ref Range   WBC 6.9 3.8 - 10.6 K/uL   RBC 4.38 (L) 4.40 - 5.90 MIL/uL   Hemoglobin 13.3 13.0 - 18.0 g/dL   HCT 40.1 40.0 - 52.0 %   MCV 91.5 80.0 - 100.0 fL   MCH 30.3 26.0 - 34.0 pg   MCHC 33.1 32.0 - 36.0 g/dL   RDW 14.5 11.5 - 14.5 %   Platelets 135 (L) 150 - 440 K/uL  Basic metabolic panel     Status: Abnormal   Collection Time: 08/12/17  5:11 AM  Result Value Ref Range   Sodium 136 135 - 145 mmol/L   Potassium 4.8 3.5 - 5.1 mmol/L   Chloride 110 101 - 111 mmol/L   CO2 20 (L) 22 - 32 mmol/L   Glucose, Bld 164 (H) 65 - 99 mg/dL   BUN 79 (H) 6 - 20 mg/dL   Creatinine, Ser 4.11 (H) 0.61 - 1.24 mg/dL   Calcium 8.2 (L) 8.9 - 10.3 mg/dL   GFR calc non Af Amer 13 (L) >60 mL/min   GFR calc Af Amer 15 (L) >60 mL/min   Anion gap 6 5 - 15  Glucose, capillary     Status: Abnormal   Collection Time: 08/12/17  7:35 AM  Result Value Ref Range   Glucose-Capillary 153 (H) 65 - 99 mg/dL   Recent Results (from the past 240 hour(s))  Culture, blood (routine x 2)     Status: None (Preliminary result)   Collection Time: 08/10/17 11:15 PM  Result Value Ref Range Status   Specimen Description BLOOD LEFT ANTECUBITAL  Final   Special Requests   Final    BOTTLES DRAWN AEROBIC AND ANAEROBIC  Blood Culture adequate volume   Culture NO GROWTH 2 DAYS  Final   Report Status PENDING  Incomplete  Culture, blood (routine x 2)     Status: None (Preliminary result)   Collection Time: 08/10/17 11:15 PM  Result Value Ref Range Status   Specimen Description BLOOD LEFT HAND  Final   Special Requests   Final    BOTTLES DRAWN AEROBIC AND ANAEROBIC Blood Culture adequate volume   Culture NO GROWTH 2 DAYS  Final   Report Status PENDING  Incomplete   Creatinine:  Recent Labs  08/10/17 1958 08/11/17 0340 08/12/17 0511  CREATININE 5.37* 4.59* 4.11*    Impression/Assessment/plan:  1) urinary retention-this is likely multifactorial and he may have some baseline bladder dysfunction from his prior BPH.  Continue tamsulosin and Foley.  Given back procedure Thursday I would recommend keeping the Foley until he is up and recovered from that procedure.  Therefore we will see him back in the office for a voiding trial.  2) right renal pelvic mass-I discussed this finding with the patient and his wife and drew them a picture of the anatomy we discussed the importance of follow-up to schedule ureteroscopy for this lesion.  We discussed these lesions can be benign and malignant.  We will sign off.  Follow-up on chart and contact information given to patient/wife.  Continue Foley catheter and follow-up in 7-10 days in the office for voiding trial and to schedule right ureteroscopy.  Oluwadamilola Deliz 08/12/2017, 9:11 AM

## 2017-08-12 NOTE — Progress Notes (Signed)
Pt. with bright red blood in urine per foley after large BM. MD notified, will continue to monitor. Ammie Dalton, RN

## 2017-08-12 NOTE — Plan of Care (Signed)
Problem: Safety: Goal: Ability to remain free from injury will improve Outcome: Progressing Pt on low bed d/t recent plavix use  Problem: Pain Managment: Goal: General experience of comfort will improve Outcome: Progressing Pt has no complaints of paint today  Problem: Tissue Perfusion: Goal: Risk factors for ineffective tissue perfusion will decrease Outcome: Not Progressing Plavix and heparin discontinued d/t planned surgery and blood in urine. Pt. only on aspirin.  Problem: Activity: Goal: Risk for activity intolerance will decrease Outcome: Not Progressing Pt on bedrest with bathroom privileges. Pt tolerating getting up to the [facility] well.  Problem: Bowel/Gastric: Goal: Will not experience complications related to bowel motility Outcome: Progressing Pt had large BM today

## 2017-08-12 NOTE — Discharge Instructions (Signed)
Indwelling Urinary Catheter Care, Adult  Take good care of your catheter to keep it working and to prevent problems.  How to wear your catheter  Attach your catheter to your leg with tape (adhesive tape) or a leg strap. Make sure it is not too tight. If you use tape, remove any bits of tape that are already on the catheter.  How to wear a drainage bag  You should have:   A large overnight bag.   A small leg bag.    Overnight Bag  You may wear the overnight bag at any time. Always keep the bag below the level of your bladder but off the floor. When you sleep, put a clean plastic bag in a wastebasket. Then hang the bag inside the wastebasket.  Leg Bag  Never wear the leg bag at night. Always wear the leg bag below your knee. Keep the leg bag secure with a leg strap or tape.  How to care for your skin   Clean the skin around the catheter at least once every day.   Shower every day. Do not take baths.   Put creams, lotions, or ointments on your genital area only as told by your doctor.   Do not use powders, sprays, or lotions on your genital area.  How to clean your catheter and your skin  1. Wash your hands with soap and water.  2. Wet a washcloth in warm water and gentle (mild) soap.  3. Use the washcloth to clean the skin where the catheter enters your body. Clean downward and wipe away from the catheter in small circles. Do not wipe toward the catheter.  4. Pat the area dry with a clean towel. Make sure to clean off all soap.  How to care for your drainage bags  Empty your drainage bag when it is ?- full or at least 2-3 times a day. Replace your drainage bag once a month or sooner if it starts to smell bad or look dirty. Do not clean your drainage bag unless told by your doctor.  Emptying a drainage bag    Supplies Needed   Rubbing alcohol.   Gauze pad or cotton ball.   Tape or a leg strap.    Steps  1. Wash your hands with soap and water.  2. Separate (detach) the bag from your leg.  3. Hold the bag over  the toilet or a clean container. Keep the bag below your hips and bladder. This stops pee (urine) from going back into the tube.  4. Open the pour spout at the bottom of the bag.  5. Empty the pee into the toilet or container. Do not let the pour spout touch any surface.  6. Put rubbing alcohol on a gauze pad or cotton ball.  7. Use the gauze pad or cotton ball to clean the pour spout.  8. Close the pour spout.  9. Attach the bag to your leg with tape or a leg strap.  10. Wash your hands.    Changing a drainage bag  Supplies Needed   Alcohol wipes.   A clean drainage bag.   Adhesive tape or a leg strap.    Steps  1. Wash your hands with soap and water.  2. Separate the dirty bag from your leg.  3. Pinch the rubber catheter with your fingers so that pee does not spill out.  4. Separate the catheter tube from the drainage tube where these tubes connect (at the   connection valve). Do not let the tubes touch any surface.  5. Clean the end of the catheter tube with an alcohol wipe. Use a different alcohol wipe to clean the end of the drainage tube.  6. Connect the catheter tube to the drainage tube of the clean bag.  7. Attach the new bag to the leg with adhesive tape or a leg strap.  8. Wash your hands.    How to prevent infection and other problems   Never pull on your catheter or try to remove it. Pulling can damage tissue in your body.   Always wash your hands before and after touching your catheter.   If a leg strap gets wet, replace it with a dry one.   Drink enough fluids to keep your pee clear or pale yellow, or as told by your doctor.   Do not let the drainage bag or tubing touch the floor.   Wear cotton underwear.   If you are male, wipe from front to back after you poop (have a bowel movement).   Check on the catheter often to make sure it works and the tubing is not twisted.  Get help if:   Your pee is cloudy.   Your pee smells unusually bad.   Your pee is not draining into the bag.   Your  tube gets clogged.   Your catheter starts to leak.   Your bladder feels full.  Get help right away if:   You have redness, swelling, or pain where the catheter enters your body.   You have fluid, pus, or a bad smell coming from the area where the catheter enters your body.   The area where the catheter enters your body feels warm.   You have a fever.   You have pain in your:  ? Stomach (abdomen).  ? Legs.  ? Lower back.  ? Bladder.   You see blood fill the catheter.   Your pee is pink or red.   You feel sick to your stomach (nauseous).   You throw up (vomit).   You have chills.   Your catheter gets pulled out.  This information is not intended to replace advice given to you by your health care provider. Make sure you discuss any questions you have with your health care provider.  Document Released: 01/25/2013 Document Revised: 08/28/2016 Document Reviewed: 03/15/2014  [employer] Interactive Patient Education  2018 [employer]

## 2017-08-12 NOTE — Progress Notes (Signed)
[facility] [facility] [place] NAME: [name]    MR#:  [id number]  DATE OF BIRTH:  [birth year]  SUBJECTIVE:  CHIEF COMPLAINT:   Chief Complaint  Patient presents with  . Weakness  . Emesis     HAd a fall and vertebral fracture, Not much mobile now. More weak. Found to have pneumonia and Renal failure with UTI.   Feels much better today. Renal func improved some.  REVIEW OF SYSTEMS:   CONSTITUTIONAL: No fever,+ fatigue/weakness.  EYES: No blurred or double vision.  EARS, NOSE, AND THROAT: No tinnitus or ear pain.  RESPIRATORY: No cough, shortness of breath, wheezing or hemoptysis.  CARDIOVASCULAR: No chest pain, orthopnea, edema.  GASTROINTESTINAL: + nausea/vomiting, no diarrhea or abdominal pain.  GENITOURINARY: No dysuria, hematuria.  Decreased urination, inability to urinate ENDOCRINE: No polyuria, nocturia,  HEMATOLOGY: No anemia, easy bruising or bleeding SKIN: No rash or lesion. MUSCULOSKELETAL: Back pain, inability to ambulate NEUROLOGIC: No tingling, numbness, weakness.  PSYCHIATRY: No anxiety or depression.   ROS  DRUG ALLERGIES:   Allergies  Allergen Reactions  . Ace Inhibitors Other (See Comments)    Reaction:  Raises potassium   . Quinapril Rash    hyperkalemia    VITALS:  Blood pressure (!) 157/73, pulse 70, temperature 98.3 F (36.8 C), temperature source Oral, resp. rate 18, height 6\' 2"  (1.88 m), weight 92.5 kg (204 lb), SpO2 (!) 79 %.  PHYSICAL EXAMINATION:   GENERAL:  76 y.o.-year-old patient lying in the bed with no acute distress.  Frail-appearing EYES: Pupils equal, round, reactive to light and accommodation. No scleral icterus. Extraocular muscles intact.  HEENT: Head atraumatic, normocephalic. Oropharynx and nasopharynx clear.   NECK:  Supple, no jugular venous distention. No thyroid enlargement, no tenderness. Healing right neck wound from recent carotid endarterectomy LUNGS: Normal breath sounds  bilaterally, no wheezing, rales,rhonchi or crepitation. No use of accessory muscles of respiration.  CARDIOVASCULAR: S1, S2 normal. No murmurs, rubs, or gallops.  ABDOMEN: Soft, nontender, nondistended. Bowel sounds present. No organomegaly or mass.  EXTREMITIES: No pedal edema, cyanosis, or clubbing.  NEUROLOGIC: Cranial nerves II through XII are intact. Power 3/5. Gait not checked.  PSYCHIATRIC: The patient is alert and oriented x 3.  SKIN: No obvious rash, lesion, or ulcer.  Physical Exam LABORATORY PANEL:   CBC  Recent Labs Lab 08/12/17 0511  WBC 6.9  HGB 13.3  HCT 40.1  PLT 135*   ------------------------------------------------------------------------------------------------------------------  Chemistries   Recent Labs Lab 08/10/17 1958  08/12/17 0511  NA 129*  < > 136  K 5.2*  < > 4.8  CL 100*  < > 110  CO2 18*  < > 20*  GLUCOSE 333*  < > 164*  BUN 118*  < > 79*  CREATININE 5.37*  < > 4.11*  CALCIUM 9.0  < > 8.2*  AST 31  --   --   ALT 38  --   --   ALKPHOS 160*  --   --   BILITOT 0.8  --   --   < > = values in this interval not displayed. ------------------------------------------------------------------------------------------------------------------  Cardiac Enzymes  Recent Labs Lab 08/10/17 1958  TROPONINI <0.03   ------------------------------------------------------------------------------------------------------------------  RADIOLOGY:  Ct Abdomen Pelvis Wo Contrast  Result Date: 08/10/2017 CLINICAL DATA:  76 year old male with abdominal pain, nausea and acute renal failure. EXAM: CT ABDOMEN AND PELVIS WITHOUT CONTRAST TECHNIQUE: Multidetector CT imaging of the abdomen and pelvis was performed following the standard  protocol without IV contrast. COMPARISON:  Abdominal CT dated 04/21/2016 FINDINGS: Evaluation of this exam is limited in the absence of intravenous contrast. Lower chest: There is a 2.5 x 4.5 cm focal consolidative change at the right  lung base with nodularity of the adjacent parenchyma. This may represent an area of pneumonia possibly related to aspiration. Less likely this may represent an area of pulmonary contusion given adjacent vertebral body fracture. Underlying mass or a neoplastic process is not excluded. Clinical correlation and follow-up to resolution recommended. There is advanced multi vessel coronary vascular calcification and CABG surgery. No intra-abdominal free air or free fluid. Hepatobiliary: The liver is unremarkable. Cholecystectomy with mild dilatation of the central CBD, likely post cholecystectomy. Pancreas: Unremarkable. No pancreatic ductal dilatation or surrounding inflammatory changes. Spleen: Normal in size without focal abnormality. Adrenals/Urinary Tract: The adrenal glands are unremarkable. 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 Further evaluation with direct visualization and ureteroscopy recommended. There is no hydronephrosis or nephrolithiasis on either side. The visualized ureters appear unremarkable. There is trabeculated appearance of the bladder wall with bilateral bladder wall diverticulum measuring up to 5 cm on the left. Stomach/Bowel: There are scattered colonic diverticula without active inflammatory changes. This postsurgical changes of bowel surgery with ileocolonic anastomosis in the right upper quadrant. No evidence of bowel obstruction or active inflammation. Appendectomy. There is a small hiatal hernia with gastroesophageal reflux. There is a 4 cm distal duodenal diverticulum without active inflammatory changes. Vascular/Lymphatic: There is advanced aortoiliac atherosclerotic disease. There is a 3 cm infrarenal abdominal aortic aneurysm. There is mild narrowing of the infrarenal aorta secondary to advanced atherosclerotic disease. No portal venous gas identified. No adenopathy.  Reproductive: The prostate gland is not visualized, likely surgically absent. Other: Midline vertical anterior abdominal wall incisional scar. No fluid collection. Musculoskeletal: Osteopenia with degenerative changes of the spine. There is T12 compression fracture with approximately 50% loss of vertebral body height. This fracture appears acute. There is mild buckling of the superior posterior cortex of the T12 vertebra. No significant narrowing of the central canal. If there is neurological deficit or clinical signs of cord compression, MRI is recommended for further evaluation. There is small amount of hematoma in the adjacent perivertebral soft tissues. Median sternotomy wires noted. IMPRESSION: 1. Rounded lesion along the posterior wall of the right renal pelvis highly concerning for a neoplastic process such as TCC. Further evaluation with direct visualization and ureteroscopy recommended. 2. Acute T12 compression fracture with approximately 50% loss of vertebral body height and minimal buckling of the superior posterior cortex. No significant central canal narrowing. If there is clinical concern for cord compression, further evaluation with MRI recommended. 3. Focal area of consolidative change and adjacent parenchymal nodularity in the right lower lobe most likely representing pneumonia possibly related to aspiration. Clinical correlation is recommended. 4. Colonic and duodenal diverticulum without active inflammatory changes. No bowel obstruction or active inflammation. 5. Trabeculated bladder wall with bladder diverticula. 6.  Aortic Atherosclerosis (ICD10-I70.0). 7. A 3 cm infrarenal abdominal aortic aneurysm. Recommend followup by ultrasound in 3 years. This recommendation follows ACR consensus guidelines: White Paper of the ACR Incidental Findings Committee II on Vascular Findings. J Am Coll Radiol 2013; 99:242-683 Electronically Signed   By: Anner Crete M.D.   On: 08/10/2017 21:54   Dg Chest 2  View  Result Date: 08/10/2017 CLINICAL DATA:  Acute  onset of weakness and shortness of breath. EXAM: CHEST  2 VIEW COMPARISON:  December 24, 2016 FINDINGS: Elevation of the right hemidiaphragm. Increased opacity behind the elevated right hemidiaphragm is best seen on the lateral view, new since March 2018. The lungs are otherwise clear. The heart, hila, and mediastinum are stable. IMPRESSION: Opacity behind the right hemidiaphragm, best seen on the lateral view, could represent pneumonia or atelectasis. Recommend clinical correlation and follow-up to resolution. Electronically Signed   By: Dorise Bullion III M.D   On: 08/10/2017 20:56    ASSESSMENT AND PLAN:   Active Problems:   CAP (community acquired pneumonia)  1 acute right lower lobe pneumonia Most likely secondary to aspiration given problems with swallowing since right carotid endarterectomy July 18, 2017 Continue Zosyn, aspiration precautions, speech therapy to evaluate/treat, head of the bed at 30 degrees at all times, IV fluids for rehydration, and follow-up on cultures  2 acute urinary retention with Ac on ch renal failure. Compounded by abnormal CT scan of the abdomen/pelvis noted for right renal pelvic lesion concerning for  TCC Antibiotics per above, follow-up on cultures, and consult urology for expert opinion   Appreciated urology help, for now cont foley, and d./c with foley- they will evaluate in office for removing cath and do ureteroscopy in office visit.  3 acute abnormal CT scan of the abdomen/pelvis Concerning for right renal pelvic lesion/ TTC Plan of care as stated above  4 acute/subacute T12 compression fraction Noted 50% loss of height Occurred over a week ago,   Foley placed for urinary retention, plans for kyphoplasty by Dr. Rudene Christians on Thursday.  5 acute hyperglycemia with chronic diabetes mellitus type 2 Most likely exacerbated by pneumonia and a urinary tract infection Continue home insulin regimen,  start sliding scale insulin with Accu-Cheks per routine, check hemoglobin A1c determine level of control, cardiac/ADA diet, IV fluids for rehydration, make changes as per necessary  6 acute urinary tract infection Antibiotics per above, IV fluids for rehydration, follow-up on cultures  7 acute kidney injury/acute renal failure with CKD Baseline creatinine between 2.8-3 Most likely secondary to acute dehydration/poor p.o. intake Avoid nephrotoxic agents, strict I&O monitoring, BMP daily, IV fluids for rehydration, and consider nephrology consultation if any further worsening  8 acute probable dysphagia Problems with swallowing since right carotid endarterectomy on July 18, 2017 Plan of care as stated above   All the records are reviewed and case discussed with Care Management/Social Workerr. Management plans discussed with the patient, family and they are in agreement.  CODE STATUS: DNR  TOTAL TIME TAKING CARE OF THIS PATIENT: 45 minutes.   POSSIBLE D/C IN 3-4 DAYS, DEPENDING ON CLINICAL CONDITION.   Vaughan Basta M.D on 08/12/2017   Between 7am to 6pm - Pager - [phone]  After 6pm go to [email or web address] - password [email or web address] [facility]  Office  [phone]  CC: Primary care physician; Glean Hess, MD  Note: This dictation was prepared with Dragon dictation along with smaller phrase technology. Any transcriptional errors that result from this process are unintentional.

## 2017-08-13 LAB — CBC
HCT: 39.1 % — ABNORMAL LOW (ref 40.0–52.0)
HEMOGLOBIN: 12.8 g/dL — AB (ref 13.0–18.0)
MCH: 30 pg (ref 26.0–34.0)
MCHC: 32.8 g/dL (ref 32.0–36.0)
MCV: 91.5 fL (ref 80.0–100.0)
PLATELETS: 130 10*3/uL — AB (ref 150–440)
RBC: 4.28 MIL/uL — AB (ref 4.40–5.90)
RDW: 14.5 % (ref 11.5–14.5)
WBC: 5.8 10*3/uL (ref 3.8–10.6)

## 2017-08-13 LAB — GLUCOSE, CAPILLARY
GLUCOSE-CAPILLARY: 149 mg/dL — AB (ref 65–99)
GLUCOSE-CAPILLARY: 136 mg/dL — AB (ref 65–99)
GLUCOSE-CAPILLARY: 157 mg/dL — AB (ref 65–99)
GLUCOSE-CAPILLARY: 109 mg/dL — AB (ref 65–99)

## 2017-08-13 LAB — BASIC METABOLIC PANEL
Anion gap: 5 (ref 5–15)
BUN: 59 mg/dL — AB (ref 6–20)
CHLORIDE: 112 mmol/L — AB (ref 101–111)
CO2: 21 mmol/L — ABNORMAL LOW (ref 22–32)
CREATININE: 3.8 mg/dL — AB (ref 0.61–1.24)
Calcium: 8.3 mg/dL — ABNORMAL LOW (ref 8.9–10.3)
GFR, EST AFRICAN AMERICAN: 16 mL/min — AB (ref >60)
GFR, EST NON AFRICAN AMERICAN: 14 mL/min — AB (ref >60)
Glucose, Bld: 153 mg/dL — ABNORMAL HIGH (ref 65–99)
POTASSIUM: 3.9 mmol/L (ref 3.5–5.1)
SODIUM: 138 mmol/L (ref 135–145)

## 2017-08-13 LAB — URINE CULTURE

## 2017-08-13 MED ORDER — SODIUM CHLORIDE 0.9 % IV SOLN
3.0000 g | Freq: Two times a day (BID) | INTRAVENOUS | Status: DC
  Administered 2017-08-13 – 2017-08-15 (×4): 3 g via INTRAVENOUS
  Filled 2017-08-13 (×7): qty 3

## 2017-08-13 NOTE — Progress Notes (Signed)
[facility] [place] [facility] [place] NAME: [name]    MR#:  [id number]  DATE OF BIRTH:  1940/12/30  SUBJECTIVE:  CHIEF COMPLAINT:   Chief Complaint  Patient presents with  . Weakness  . Emesis     HAd a fall and vertebral fracture, Not much mobile now. More weak. Found to have pneumonia and Renal failure with UTI.   Feels much better today. Renal func improved .  REVIEW OF SYSTEMS:   CONSTITUTIONAL: No fever,+ fatigue/weakness.  EYES: No blurred or double vision.  EARS, NOSE, AND THROAT: No tinnitus or ear pain.  RESPIRATORY: No cough, shortness of breath, wheezing or hemoptysis.  CARDIOVASCULAR: No chest pain, orthopnea, edema.  GASTROINTESTINAL: + nausea/vomiting, no diarrhea or abdominal pain.  GENITOURINARY: No dysuria, hematuria.  Decreased urination, inability to urinate ENDOCRINE: No polyuria, nocturia,  HEMATOLOGY: No anemia, easy bruising or bleeding SKIN: No rash or lesion. MUSCULOSKELETAL: Back pain, inability to ambulate NEUROLOGIC: No tingling, numbness, weakness.  PSYCHIATRY: No anxiety or depression.   ROS  DRUG ALLERGIES:   Allergies  Allergen Reactions  . Ace Inhibitors Other (See Comments)    Reaction:  Raises potassium   . Quinapril Rash    hyperkalemia    VITALS:  Blood pressure 107/65, pulse 79, temperature 98.2 F (36.8 C), temperature source Oral, resp. rate 19, height 6\' 2"  (1.88 m), weight 92.5 kg (204 lb), SpO2 95 %.  PHYSICAL EXAMINATION:   GENERAL:  76 y.o.-year-old patient lying in the bed with no acute distress.  Frail-appearing EYES: Pupils equal, round, reactive to light and accommodation. No scleral icterus. Extraocular muscles intact.  HEENT: Head atraumatic, normocephalic. Oropharynx and nasopharynx clear.   NECK:  Supple, no jugular venous distention. No thyroid enlargement, no tenderness. Healing right neck wound from recent carotid endarterectomy LUNGS: Normal breath sounds bilaterally, no  wheezing, rales,rhonchi or crepitation. No use of accessory muscles of respiration.  CARDIOVASCULAR: S1, S2 normal. No murmurs, rubs, or gallops.  ABDOMEN: Soft, nontender, nondistended. Bowel sounds present. No organomegaly or mass. Foley with No more red urine. EXTREMITIES: No pedal edema, cyanosis, or clubbing.  NEUROLOGIC: Cranial nerves II through XII are intact. Power 3/5. Gait not checked.  PSYCHIATRIC: The patient is alert and oriented x 3.  SKIN: No obvious rash, lesion, or ulcer.  Physical Exam LABORATORY PANEL:   CBC  Recent Labs Lab 08/13/17 0550  WBC 5.8  HGB 12.8*  HCT 39.1*  PLT 130*   ------------------------------------------------------------------------------------------------------------------  Chemistries   Recent Labs Lab 08/10/17 1958  08/13/17 0550  NA 129*  < > 138  K 5.2*  < > 3.9  CL 100*  < > 112*  CO2 18*  < > 21*  GLUCOSE 333*  < > 153*  BUN 118*  < > 59*  CREATININE 5.37*  < > 3.80*  CALCIUM 9.0  < > 8.3*  AST 31  --   --   ALT 38  --   --   ALKPHOS 160*  --   --   BILITOT 0.8  --   --   < > = values in this interval not displayed. ------------------------------------------------------------------------------------------------------------------  Cardiac Enzymes  Recent Labs Lab 08/10/17 1958  TROPONINI <0.03   ------------------------------------------------------------------------------------------------------------------  RADIOLOGY:  No results found.  ASSESSMENT AND PLAN:   Active Problems:   CAP (community acquired pneumonia)  1 acute right lower lobe pneumonia Most likely secondary to aspiration given problems with swallowing since right carotid endarterectomy July 18, 2017  Given Zosyn, aspiration precautions, speech therapy to evaluate/treat, head of the bed at 30 degrees at all times, IV fluids for rehydration, and follow-up on cultures  SLP eval done by nursing, pt have no difficulties.  Switch to Unasyn.  2  acute urinary retention with Ac on ch renal failure. 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  3 acute abnormal CT scan of the abdomen/pelvis Concerning for right renal pelvic lesion/ TTC Plan of care as stated above  4 acute/subacute T12 compression fraction Noted 50% loss of height Occurred over a week ago,   Foley placed for urinary retention, plans for kyphoplasty by Dr. Rudene Christians on Thursday.  5 acute hyperglycemia with chronic diabetes mellitus type 2 Most likely exacerbated by pneumonia and a urinary tract infection Continue home insulin regimen, start sliding scale insulin with Accu-Cheks per routine, checked hemoglobin A1c- 7.1 , cardiac/ADA diet, IV fluids for rehydration, make changes as per necessary  6 acute urinary tract infection Antibiotics per above, IV fluids for rehydration, follow-up on cultures   Changed to unasyn for now.  7 acute kidney injury/acute renal failure with CKD Baseline creatinine between 2.8-3 Most likely secondary to acute dehydration/poor p.o. intake Avoid nephrotoxic agents, strict I&O monitoring, BMP daily, IV fluids for rehydration, improved.  All the records are reviewed and case discussed with Care Management/Social Workerr. Management plans discussed with the patient, family and they are in agreement.  CODE STATUS: DNR  TOTAL TIME TAKING CARE OF THIS PATIENT: 45 minutes.   POSSIBLE D/C IN 3-4 DAYS, DEPENDING ON CLINICAL CONDITION.   Vaughan Basta M.D on 08/13/2017   Between 7am to 6pm - Pager - [phone]  After 6pm go to [email or web address] - password [email or web address] [facility]  Office   [phone]  CC: Primary care physician; Glean Hess, MD  Note: This dictation was prepared with Dragon dictation along with smaller phrase technology. Any transcriptional errors that result from this process are unintentional.

## 2017-08-13 NOTE — Care Management Note (Signed)
Case Management Note  Patient Details  Name: [name] MRN: [id number] Date of Birth: 1941-05-04  Subjective/Objective:  Admitted to [facility] with the diagnosis of pneumonia. Lives with wife, [name] [phone]), last seen Dr. Army Melia 07/07/17. Prescriptions are filled at [facility] in [place]. Home Health per [facility] in the past. No skilled nursing. No home oxygen. Rolling walker, cane, and bedpan in the home. Self feed, self dress, needs help with baths per wife. Doesn't drive any more since being sick. [place] prior to this admission. Decreased appetite. Lost 20-30 pounds. Family will transport. Possible kyphoplasty 08/14/2017                   Action/Plan: Will continue to follow for discharge needs   Expected Discharge Date:                  Expected Discharge Plan:     In-House Referral:     Discharge planning Services     Post Acute Care Choice:    Choice offered to:     DME Arranged:    DME Agency:     HH Arranged:    HH Agency:     Status of Service:     If discussed at [employer] of Stay Meetings, dates discussed:    Additional Comments:  Shelbie Ammons, RN MSN CCM Care Management [phone] 08/13/2017, 9:24 AM

## 2017-08-13 NOTE — Anesthesia Preprocedure Evaluation (Addendum)
Anesthesia Evaluation  Patient identified by MRN, date of birth, ID band Patient awake    Reviewed: Allergy & Precautions, H&P , NPO status , Patient's Chart, lab work & pertinent test results, reviewed documented beta blocker date and time   Airway Mallampati: II   Neck ROM: full    Dental  (+) Poor Dentition   Pulmonary neg pulmonary ROS, pneumonia, unresolved, former smoker,    Pulmonary exam normal        Cardiovascular hypertension, + CAD, + Past MI and + Peripheral Vascular Disease  negative cardio ROS Normal cardiovascular exam+ dysrhythmias  Rhythm:regular Rate:Normal     Neuro/Psych  Neuromuscular disease CVA negative neurological ROS  negative psych ROS   GI/Hepatic negative GI ROS, Neg liver ROS,   Endo/Other  negative endocrine ROSdiabetesHypothyroidism   Renal/GU Renal diseasenegative Renal ROS  negative genitourinary   Musculoskeletal   Abdominal   Peds  Hematology negative hematology ROS (+)   Anesthesia Other Findings Past Medical History: No date: Blood transfusion without reported diagnosis No date: 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 No date: Coronary artery disease No date: Diabetes mellitus without complication ([place]) No date: Dysrhythmia No date: Hyperlipidemia No date: Hypertension 1997: Myocardial infarction ([place]) No date: Peripheral vascular disease ([place]) 2014: Pneumonia 05/2017: Stroke ([place]) No date: Thyroid disease   Reproductive/Obstetrics negative OB ROS                           Anesthesia Physical Anesthesia Plan  ASA: III  Anesthesia Plan:    Post-op Pain Management:    Induction:   PONV Risk Score and Plan: 1  Airway Management Planned:   Additional Equipment:   Intra-op Plan:   Post-operative Plan:   Informed Consent: I have reviewed the patients History and Physical, chart, labs and discussed the procedure including the risks, benefits  and alternatives for the proposed anesthesia with the patient or authorized representative who has indicated his/her understanding and acceptance.   Dental Advisory Given  Plan Discussed with: CRNA  Anesthesia Plan Comments:         Anesthesia Quick Evaluation

## 2017-08-13 NOTE — Progress Notes (Signed)
Speech Therapy Note: reviewed chart notes; consulted NSG/pt re: status today. NSG reported pt has been tolerating meals and swallowing pills well(w/ water also); pt denied any difficulty swallowing.  ST services will sign off at this time w/ NSG to reconsult if any decline in status while admitted.   Orinda Kenner, [place], CCC-SLP

## 2017-08-13 NOTE — Plan of Care (Signed)
Problem: Pain Managment: Goal: General experience of comfort will improve Outcome: Progressing No request for pain med this shift  Problem: Fluid Volume: Goal: Ability to maintain a balanced intake and output will improve Outcome: Progressing BM this shift  Problem: Bowel/Gastric: Goal: Will not experience complications related to bowel motility BM this shift

## 2017-08-13 NOTE — Progress Notes (Signed)
Patient improved medically, plan kyphoplasty tomorrow. Site marked

## 2017-08-13 NOTE — Care Management Important Message (Signed)
Important Message  Patient Details  Name: [name] MRN: [id number] Date of Birth: 31-Mar-1941   Medicare Important Message Given:  Yes    Shelbie Ammons, RN 08/13/2017, 7:34 AM

## 2017-08-14 ENCOUNTER — Inpatient Hospital Stay: Payer: PPO

## 2017-08-14 ENCOUNTER — Inpatient Hospital Stay: Payer: PPO | Admitting: Anesthesiology

## 2017-08-14 ENCOUNTER — Encounter
Admission: EM | Disposition: A | Discharge status: Home / Self Care | Payer: Self-pay | Source: Home / Self Care | Attending: Internal Medicine

## 2017-08-14 ENCOUNTER — Encounter: Payer: Self-pay | Admitting: *Deleted

## 2017-08-14 HISTORY — PX: CYSTOSCOPY W/ URETERAL STENT REMOVAL: SHX1430

## 2017-08-14 HISTORY — PX: KYPHOPLASTY: SHX5884

## 2017-08-14 LAB — BASIC METABOLIC PANEL
ANION GAP: 8 (ref 5–15)
BUN: 39 mg/dL — AB (ref 6–20)
CALCIUM: 8.4 mg/dL — AB (ref 8.9–10.3)
CHLORIDE: 109 mmol/L (ref 101–111)
CO2: 20 mmol/L — ABNORMAL LOW (ref 22–32)
CREATININE: 2.94 mg/dL — AB (ref 0.61–1.24)
GFR calc non Af Amer: 19 mL/min — ABNORMAL LOW (ref >60)
GFR, EST AFRICAN AMERICAN: 22 mL/min — AB (ref >60)
Glucose, Bld: 107 mg/dL — ABNORMAL HIGH (ref 65–99)
POTASSIUM: 3.6 mmol/L (ref 3.5–5.1)
SODIUM: 137 mmol/L (ref 135–145)

## 2017-08-14 LAB — GLUCOSE, CAPILLARY
GLUCOSE-CAPILLARY: 111 mg/dL — AB (ref 65–99)
GLUCOSE-CAPILLARY: 243 mg/dL — AB (ref 65–99)
Glucose-Capillary: 95 mg/dL (ref 65–99)
Glucose-Capillary: 96 mg/dL (ref 65–99)
Glucose-Capillary: 322 mg/dL — ABNORMAL HIGH (ref 65–99)

## 2017-08-14 LAB — MRSA PCR SCREENING: MRSA BY PCR: NEGATIVE

## 2017-08-14 SURGERY — KYPHOPLASTY
Anesthesia: General | Site: Spine Thoracic | Wound class: Clean

## 2017-08-14 MED ORDER — DOCUSATE SODIUM 100 MG PO CAPS
100.0000 mg | ORAL_CAPSULE | Freq: Two times a day (BID) | ORAL | Status: DC
  Administered 2017-08-15: 08:00:00 100 mg via ORAL
  Filled 2017-08-14 (×2): qty 1

## 2017-08-14 MED ORDER — LIDOCAINE HCL 1 % IJ SOLN
INTRAMUSCULAR | Status: DC | PRN
  Administered 2017-08-14: 25 mL

## 2017-08-14 MED ORDER — METOCLOPRAMIDE HCL 5 MG PO TABS: 5.0000 mg | ORAL_TABLET | Freq: Three times a day (TID) | ORAL | Status: DC | PRN

## 2017-08-14 MED ORDER — KETAMINE HCL 50 MG/ML IJ SOLN
INTRAMUSCULAR | Status: DC | PRN
  Administered 2017-08-14: 10 mg via INTRAVENOUS
  Administered 2017-08-14: 15 mg via INTRAMUSCULAR

## 2017-08-14 MED ORDER — LIDOCAINE HCL (PF) 2 % IJ SOLN
INTRAMUSCULAR | Status: AC
  Filled 2017-08-14: qty 10

## 2017-08-14 MED ORDER — PROPOFOL 10 MG/ML IV BOLUS
INTRAVENOUS | Status: DC | PRN
  Administered 2017-08-14: 30 mg via INTRAVENOUS

## 2017-08-14 MED ORDER — BUPIVACAINE-EPINEPHRINE (PF) 0.5% -1:200000 IJ SOLN
INTRAMUSCULAR | Status: AC
  Filled 2017-08-14: qty 30

## 2017-08-14 MED ORDER — IOPAMIDOL (ISOVUE-M 200) INJECTION 41%
INTRAMUSCULAR | Status: AC
  Filled 2017-08-14: qty 20

## 2017-08-14 MED ORDER — FENTANYL CITRATE (PF) 100 MCG/2ML IJ SOLN
INTRAMUSCULAR | Status: DC | PRN
  Administered 2017-08-14 (×2): 25 ug via INTRAVENOUS
  Administered 2017-08-14: 50 ug via INTRAVENOUS
  Administered 2017-08-14: 25 ug via INTRAVENOUS

## 2017-08-14 MED ORDER — PROPOFOL 500 MG/50ML IV EMUL
INTRAVENOUS | Status: DC | PRN
Start: 2017-08-14 — End: 2017-08-14
  Administered 2017-08-14: 50 ug/kg/min via INTRAVENOUS

## 2017-08-14 MED ORDER — FENTANYL CITRATE (PF) 100 MCG/2ML IJ SOLN
INTRAMUSCULAR | Status: AC
  Filled 2017-08-14: qty 2

## 2017-08-14 MED ORDER — LIDOCAINE 2% (20 MG/ML) 5 ML SYRINGE
INTRAMUSCULAR | Status: DC | PRN
  Administered 2017-08-14: 25 mg via INTRAVENOUS

## 2017-08-14 MED ORDER — BUPIVACAINE-EPINEPHRINE (PF) 0.5% -1:200000 IJ SOLN
INTRAMUSCULAR | Status: DC | PRN
  Administered 2017-08-14: 15 mL

## 2017-08-14 MED ORDER — LIDOCAINE HCL (PF) 1 % IJ SOLN
INTRAMUSCULAR | Status: AC
  Filled 2017-08-14: qty 30

## 2017-08-14 MED ORDER — ENOXAPARIN SODIUM 40 MG/0.4ML ~~LOC~~ SOLN: 40.0000 mg | SUBCUTANEOUS | Status: DC

## 2017-08-14 MED ORDER — PROPOFOL 10 MG/ML IV BOLUS
INTRAVENOUS | Status: AC
  Filled 2017-08-14: qty 20

## 2017-08-14 MED ORDER — METOCLOPRAMIDE HCL 5 MG/ML IJ SOLN: 5.0000 mg | Freq: Three times a day (TID) | INTRAMUSCULAR | Status: DC | PRN

## 2017-08-14 SURGICAL SUPPLY — 16 items
CEMENT KYPHON CX01A KIT/MIXER (Cement) ×2 IMPLANT
DERMABOND ADVANCED (GAUZE/BANDAGES/DRESSINGS) ×1
DERMABOND ADVANCED .7 DNX12 (GAUZE/BANDAGES/DRESSINGS) ×1 IMPLANT
DEVICE BIOPSY BONE KYPHX (INSTRUMENTS) IMPLANT
DRAPE C-ARM XRAY 36X54 (DRAPES) ×2 IMPLANT
DURAPREP 26ML APPLICATOR (WOUND CARE) ×2 IMPLANT
GLOVE SURG SYN 9.0  PF PI (GLOVE) ×1
GLOVE SURG SYN 9.0 PF PI (GLOVE) ×1 IMPLANT
GOWN SRG 2XL LVL 4 RGLN SLV (GOWNS) ×1 IMPLANT
GOWN STRL NON-REIN 2XL LVL4 (GOWNS) ×1
GOWN STRL REUS W/ TWL LRG LVL3 (GOWN DISPOSABLE) ×1 IMPLANT
GOWN STRL REUS W/TWL LRG LVL3 (GOWN DISPOSABLE) ×1
PACK KYPHOPLASTY (MISCELLANEOUS) ×2 IMPLANT
STRAP SAFETY BODY (MISCELLANEOUS) ×2 IMPLANT
TRAY KYPHOPAK 15/3 EXPRESS 1ST (MISCELLANEOUS) IMPLANT
TRAY KYPHOPAK 20/3 EXPRESS 1ST (MISCELLANEOUS) ×2 IMPLANT

## 2017-08-14 NOTE — Anesthesia Post-op Follow-up Note (Signed)
Anesthesia QCDR form completed.        

## 2017-08-14 NOTE — Transfer of Care (Signed)
Immediate Anesthesia Transfer of Care Note  Patient: [name]  Procedure(s) Performed: Coralyn Helling (N/A Spine Thoracic)  Patient Location: PACU  Anesthesia Type:General  Level of Consciousness: awake and alert   Airway & Oxygen Therapy: Patient Spontanous Breathing  Post-op Assessment: Report given to RN and Post -op Vital signs reviewed and stable  Post vital signs: Reviewed  Last Vitals:  Vitals:   08/14/17 1100 08/14/17 1238  BP: (!) 152/93   Pulse:  68  Resp: 20 10  Temp: 36.6 C   SpO2: 98% 100%    Last Pain:  Vitals:   08/14/17 1100  TempSrc: Tympanic  PainSc:          Complications: No apparent anesthesia complications

## 2017-08-14 NOTE — Progress Notes (Signed)
[facility] NAME: [name]    MR#:  [id number]  DATE OF BIRTH:  [birth year]  SUBJECTIVE:  CHIEF COMPLAINT:   Chief Complaint  Patient presents with  . Weakness  . Emesis     HAd a fall and vertebral fracture, Not much mobile now. More weak. Found to have pneumonia and Renal failure with UTI.   Feels much better today. Renal func improved.  REVIEW OF SYSTEMS:   CONSTITUTIONAL: No fever,+ fatigue/weakness.  EYES: No blurred or double vision.  EARS, NOSE, AND THROAT: No tinnitus or ear pain.  RESPIRATORY: No cough, shortness of breath, wheezing or hemoptysis.  CARDIOVASCULAR: No chest pain, orthopnea, edema.  GASTROINTESTINAL: + nausea/vomiting, no diarrhea or abdominal pain.  GENITOURINARY: No dysuria, hematuria.  Decreased urination, inability to urinate ENDOCRINE: No polyuria, nocturia,  HEMATOLOGY: No anemia, easy bruising or bleeding SKIN: No rash or lesion. MUSCULOSKELETAL: Back pain, inability to ambulate NEUROLOGIC: No tingling, numbness, weakness.  PSYCHIATRY: No anxiety or depression.   ROS  DRUG ALLERGIES:   Allergies  Allergen Reactions  . Ace Inhibitors Other (See Comments)    Reaction:  Raises potassium   . Quinapril Rash    hyperkalemia    VITALS:  Blood pressure (!) 170/81, pulse 61, temperature (!) 97.1 F (36.2 C), resp. rate (!) 22, height 6\' 2"  (1.88 m), weight 92.5 kg (204 lb), SpO2 96 %.  PHYSICAL EXAMINATION:   GENERAL:  76 y.o.-year-old patient lying in the bed with no acute distress.  Frail-appearing EYES: Pupils equal, round, reactive to light and accommodation. No scleral icterus. Extraocular muscles intact.  HEENT: Head atraumatic, normocephalic. Oropharynx and nasopharynx clear.   NECK:  Supple, no jugular venous distention. No thyroid enlargement, no tenderness. Healing right neck wound from recent carotid endarterectomy LUNGS: Normal breath sounds bilaterally, no wheezing,  rales,rhonchi or crepitation. No use of accessory muscles of respiration.  CARDIOVASCULAR: S1, S2 normal. No murmurs, rubs, or gallops.  ABDOMEN: Soft, nontender, nondistended. Bowel sounds present. No organomegaly or mass. Foley with No more red urine. EXTREMITIES: No pedal edema, cyanosis, or clubbing.  NEUROLOGIC: Cranial nerves II through XII are intact. Power 3/5. Gait not checked.  PSYCHIATRIC: The patient is alert and oriented x 3.  SKIN: No obvious rash, lesion, or ulcer.  Physical Exam LABORATORY PANEL:   CBC  Recent Labs Lab 08/13/17 0550  WBC 5.8  HGB 12.8*  HCT 39.1*  PLT 130*   ------------------------------------------------------------------------------------------------------------------  Chemistries   Recent Labs Lab 08/10/17 1958  08/14/17 0412  NA 129*  < > 137  K 5.2*  < > 3.6  CL 100*  < > 109  CO2 18*  < > 20*  GLUCOSE 333*  < > 107*  BUN 118*  < > 39*  CREATININE 5.37*  < > 2.94*  CALCIUM 9.0  < > 8.4*  AST 31  --   --   ALT 38  --   --   ALKPHOS 160*  --   --   BILITOT 0.8  --   --   < > = values in this interval not displayed. ------------------------------------------------------------------------------------------------------------------  Cardiac Enzymes  Recent Labs Lab 08/10/17 1958  TROPONINI <0.03   ------------------------------------------------------------------------------------------------------------------  RADIOLOGY:  Dg Thoracic Spine 2 View  Result Date: 08/14/2017 CLINICAL DATA:  Kyphoplasty T12 EXAM: THORACIC SPINE 2 VIEWS; DG C-ARM 61-120 MIN COMPARISON:  CT 08/10/2017 FINDINGS: Intraoperative spot images demonstrate changes of single level vertebroplasty at T12. No visible  complicating feature. IMPRESSION: Vertebral augmentation at T12.  No visible complicating feature. Electronically Signed   By: Rolm Baptise M.D.   On: 08/14/2017 12:37   Dg C-arm 61-120 Min  Result Date: 08/14/2017 CLINICAL DATA:  Kyphoplasty T12  EXAM: THORACIC SPINE 2 VIEWS; DG C-ARM 61-120 MIN COMPARISON:  CT 08/10/2017 FINDINGS: Intraoperative spot images demonstrate changes of single level vertebroplasty at T12. No visible complicating feature. IMPRESSION: Vertebral augmentation at T12.  No visible complicating feature. Electronically Signed   By: Rolm Baptise M.D.   On: 08/14/2017 12:37    ASSESSMENT AND PLAN:   Active Problems:   CAP (community acquired pneumonia)  1 acute right lower lobe pneumonia Most likely secondary to aspiration given problems with swallowing since right carotid endarterectomy July 18, 2017 Given Zosyn, aspiration precautions, speech therapy to evaluate/treat, head of the bed at 30 degrees at all times, IV fluids for rehydration, and follow-up on cultures  SLP eval done by nursing, pt have no difficulties.  Switch to Unasyn.  2 acute urinary retention with Ac on ch renal failure. 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  3 acute abnormal CT scan of the abdomen/pelvis Concerning for right renal pelvic lesion/ TTC Plan of care as stated above  4 acute/subacute T12 compression fraction Noted 50% loss of height Occurred over a week ago,   Foley placed for urinary retention,   Kyphoplasty done 08/14/17  5 acute hyperglycemia with chronic diabetes mellitus type 2 Most likely exacerbated by pneumonia and a urinary tract infection Continue home insulin regimen, start sliding scale insulin with Accu-Cheks per routine, checked hemoglobin A1c- 7.1 , cardiac/ADA diet, IV fluids for rehydration, make changes as per necessary  6 acute urinary tract infection Antibiotics per above, IV fluids for  rehydration, follow-up on cultures   Changed to unasyn for now.  7 acute kidney injury/acute renal failure with CKD Baseline creatinine between 2.8-3 Most likely secondary to acute dehydration/poor p.o. intake Avoid nephrotoxic agents, strict I&O monitoring, BMP daily, IV fluids for rehydration, improved.  All the records are reviewed and case discussed with Care Management/Social Worker. Management plans discussed with the patient, family and they are in agreement.  CODE STATUS: DNR  TOTAL TIME TAKING CARE OF THIS PATIENT: 35 minutes.   POSSIBLE D/C IN 1-2 DAYS, DEPENDING ON CLINICAL CONDITION.   Vaughan Basta M.D on 08/14/2017   Between 7am to 6pm - Pager - [phone]  After 6pm go to [email or web address] - password [email or web address] [facility]  Office  [phone]  CC: Primary care physician; Glean Hess, MD  Note: This dictation was prepared with Dragon dictation along with smaller phrase technology. Any transcriptional errors that result from this process are unintentional.

## 2017-08-14 NOTE — Progress Notes (Signed)
Medications administered by student RN [id number] with supervision of Clinical Instructor Eva Griffo MSN, RN-BC or patient's assigned RN.   

## 2017-08-14 NOTE — Op Note (Signed)
08/10/2017 - 08/14/2017  12:34 PM  PATIENT:  [name]  76 y.o. male  PRE-OPERATIVE DIAGNOSIS:  n/a  POST-OPERATIVE DIAGNOSIS:  T12 fracture  PROCEDURE:  Procedure(s): KYPHOPLASTY-T12 (N/A)  SURGEON: Laurene Footman, MD  ASSISTANTS: None  ANESTHESIA:   local and MAC  EBL:  Total I/O In: 300 [I.V.:300] Out: 500 [Urine:500]  BLOOD ADMINISTERED:none  DRAINS: none   LOCAL MEDICATIONS USED:  MARCAINE    and XYLOCAINE   SPECIMEN:  Source of Specimen:  T12 vertebral body  DISPOSITION OF SPECIMEN:  PATHOLOGY  COUNTS:  YES  TOURNIQUET:  * No tourniquets in log *  IMPLANTS: Bone cement  DICTATION: .Dragon Dictation  Patient brought the operating room and after adequate sedation was obtained the patient was placed prone and C-arm brought in with good visualization of T12 on the C-arm. After appropriate patient identification and timeout procedure local anesthetic was infiltratedon the right at T12. The back was then prepped and draped in sterile fashion and repeat timeout procedure carried out. Spinal needle was used to get local asthenic down to the pedicle on the right at T12. Small incision was then made and trocar advanced into the vertebral body and an extra pedicular fashion taking frequent C-arm views to make sure the neural foramen and spinal canal were not entered. Specimen was not obtained during biopsy part of the procedure followed by drilling carried out followed by placement of balloon inflation of T12 balloon to 4.0 cc . Next the cement was mixed and was appropriate consistency it was used to fill the vertebral bodies with about 5 cc into  T9th good fill and interdigitation. After the cement was set the trochar wasremoved and permanent C-arm views showed adequate position of the cement with good fill superior to inferior endplates medial and lateral. The wounds are closed with Dermabond followed by Band-Aids  PLAN OF CARE: Continue his inpatient  PATIENT  DISPOSITION:  PACU - hemodynamically stable.

## 2017-08-15 ENCOUNTER — Encounter: Payer: Self-pay | Admitting: Orthopedic Surgery

## 2017-08-15 LAB — BASIC METABOLIC PANEL
Anion gap: 8 (ref 5–15)
BUN: 28 mg/dL — AB (ref 6–20)
CALCIUM: 8.2 mg/dL — AB (ref 8.9–10.3)
CO2: 21 mmol/L — AB (ref 22–32)
CREATININE: 2.61 mg/dL — AB (ref 0.61–1.24)
Chloride: 106 mmol/L (ref 101–111)
GFR calc Af Amer: 26 mL/min — ABNORMAL LOW (ref >60)
GFR calc non Af Amer: 22 mL/min — ABNORMAL LOW (ref >60)
GLUCOSE: 210 mg/dL — AB (ref 65–99)
Potassium: 3.8 mmol/L (ref 3.5–5.1)
Sodium: 135 mmol/L (ref 135–145)

## 2017-08-15 LAB — GLUCOSE, CAPILLARY
Glucose-Capillary: 145 mg/dL — ABNORMAL HIGH (ref 65–99)
Glucose-Capillary: 181 mg/dL — ABNORMAL HIGH (ref 65–99)

## 2017-08-15 LAB — SURGICAL PATHOLOGY

## 2017-08-15 LAB — CULTURE, BLOOD (ROUTINE X 2)
CULTURE: NO GROWTH
Culture: NO GROWTH
SPECIAL REQUESTS: ADEQUATE
Special Requests: ADEQUATE

## 2017-08-15 MED ORDER — POLYETHYLENE GLYCOL 3350 17 G PO PACK: 17.0000 g | PACK | Freq: Every day | ORAL | 0 refills | Status: DC | PRN

## 2017-08-15 MED ORDER — INSULIN NPH (HUMAN) (ISOPHANE) 100 UNIT/ML ~~LOC~~ SUSP: 25.0000 [IU] | Freq: Two times a day (BID) | SUBCUTANEOUS | 11 refills | Status: AC

## 2017-08-15 MED ORDER — TAMSULOSIN HCL 0.4 MG PO CAPS: 0.4000 mg | ORAL_CAPSULE | Freq: Every day | ORAL | 0 refills | Status: DC

## 2017-08-15 MED ORDER — ENOXAPARIN SODIUM 30 MG/0.3ML ~~LOC~~ SOLN
30.0000 mg | SUBCUTANEOUS | Status: DC
  Administered 2017-08-15: 30 mg via SUBCUTANEOUS
  Filled 2017-08-15: qty 0.3

## 2017-08-15 MED ORDER — AMOXICILLIN-POT CLAVULANATE 875-125 MG PO TABS: 1.0000 | ORAL_TABLET | Freq: Two times a day (BID) | ORAL | 0 refills | Status: AC

## 2017-08-15 MED ORDER — DOCUSATE SODIUM 100 MG PO CAPS: 100.0000 mg | ORAL_CAPSULE | Freq: Two times a day (BID) | ORAL | 0 refills | Status: DC

## 2017-08-15 MED ORDER — CLOPIDOGREL BISULFATE 75 MG PO TABS
75.0000 mg | ORAL_TABLET | Freq: Every day | ORAL | Status: DC
  Administered 2017-08-15: 12:00:00 75 mg via ORAL
  Filled 2017-08-15: qty 1

## 2017-08-15 NOTE — Evaluation (Signed)
Physical Therapy Evaluation Patient Details Name: [name] MRN: [id number] DOB: 1941/07/15 Today's Date: 08/15/2017   History of Present Illness  Pt admitted for complaints of weakness and emesis and now diagnosed with CAP. History includes cancer, HTN, pneumonia, CAD, and DM. Pt is also s/p recent CVA. Recent fall at home with T12 compression fracture and is now s/p kypho on 11/1.   Clinical Impression  Pt is a pleasant 76 year old male who was admitted for CAP and is now s/p kyphoplasty on 11/1 due to fall at home. Pt performs bed mobility with supervision and transfers/ambulation with cga and RW. Educated on back precautions prior to mobility efforts. Educated on RW use and pt aware to use RW at this time in home environment. Pt demonstrates deficits with strength/mobility/balance. Would benefit from skilled PT to address above deficits and promote optimal return to PLOF. Recommend OP PT at this time.      Follow Up Recommendations Outpatient PT    Equipment Recommendations  None recommended by PT    Recommendations for Other Services       Precautions / Restrictions Precautions Precautions: Fall;Back Precaution Booklet Issued: No Restrictions Weight Bearing Restrictions: No      Mobility  Bed Mobility Overal bed mobility: Needs Assistance Bed Mobility: Supine to Sit     Supine to sit: Supervision     General bed mobility comments: safe technique performed with cues for correct positioning. Once seated at EOB able to sit with upright posture  Transfers Overall transfer level: Needs assistance Equipment used: Rolling walker (2 wheeled) Transfers: Sit to/from Stand Sit to Stand: Min guard         General transfer comment: safe technique with upright posture and RW. Educated on back precautions prior to mobility efforts  Ambulation/Gait Ambulation/Gait assistance: Min guard Ambulation Distance (Feet): 200 Feet Assistive device: Rolling walker (2  wheeled) Gait Pattern/deviations: Step-through pattern     General Gait Details: ambulated using RW with safe technique and reciprocal gait pattern. No pain reported during mobility.  Occasional unsteadiness due to unfamiliar use of AD.  Stairs            Wheelchair Mobility    Modified Rankin (Stroke Patients Only)       Balance Overall balance assessment: Needs assistance Sitting-balance support: Feet supported Sitting balance-Leahy Scale: Normal     Standing balance support: Bilateral upper extremity supported Standing balance-Leahy Scale: Good                               Pertinent Vitals/Pain Pain Assessment: No/denies pain    Home Living Family/patient expects to be discharged to:: Private residence Living Arrangements: Spouse/significant other Available Help at Discharge: Family;Available 24 hours/day Type of Home: Mobile home Home Access: Stairs to enter Entrance Stairs-Rails: None Entrance Stairs-Number of Steps: 4 Home Layout: One level Home Equipment: Walker - 2 wheels;Cane - single point;Shower seat Additional Comments: has DME/AE from previous surgeries, does not need/use.    Prior Function Level of Independence: Independent         Comments: Pt independent prior to admission. Recent fall dramatically reduced activity level     Hand Dominance        Extremity/Trunk Assessment   Upper Extremity Assessment Upper Extremity Assessment: Overall WFL for tasks assessed    Lower Extremity Assessment Lower Extremity Assessment: Overall WFL for tasks assessed       Communication   Communication:  No difficulties  Cognition Arousal/Alertness: Awake/alert Behavior During Therapy: WFL for tasks assessed/performed Overall Cognitive Status: Within Functional Limits for tasks assessed                                        General Comments      Exercises Other Exercises Other Exercises: Pt performed seated  ther-ex including B LE ankle pumps, quad sets, and SLRs. All ther-ex performed x 10 reps with cga and cues for correct technique   Assessment/Plan    PT Assessment Patient needs continued PT services  PT Problem List Decreased strength;Decreased activity tolerance;Decreased balance;Decreased mobility;Decreased safety awareness       PT Treatment Interventions DME instruction;Gait training;Stair training;Therapeutic exercise    PT Goals (Current goals can be found in the Care Plan section)  Acute Rehab PT Goals Patient Stated Goal: to go home PT Goal Formulation: With patient Time For Goal Achievement: 08/29/17 Potential to Achieve Goals: Good    Frequency 7X/week   Barriers to discharge        Co-evaluation               AM-PAC PT "6 Clicks" Daily Activity  Outcome Measure Difficulty turning over in bed (including adjusting bedclothes, sheets and blankets)?: None Difficulty moving from lying on back to sitting on the side of the bed? : None Difficulty sitting down on and standing up from a chair with arms (e.g., wheelchair, bedside commode, etc,.)?: Unable Help needed moving to and from a bed to chair (including a wheelchair)?: None Help needed walking in hospital room?: None Help needed climbing 3-5 steps with a railing? : A Little 6 Click Score: 20    End of Session Equipment Utilized During Treatment: Gait belt Activity Tolerance: Patient tolerated treatment well Patient left: in chair;with chair alarm set Nurse Communication: Mobility status PT Visit Diagnosis: Difficulty in walking, not elsewhere classified (R26.2);History of falling (Z91.81);Muscle weakness (generalized) (M62.81)    Time: 9323-5573 PT Time Calculation (min) (ACUTE ONLY): 21 min   Charges:   PT Evaluation $PT Eval Low Complexity: 1 Low PT Treatments $Therapeutic Exercise: 8-22 mins   PT G Codes:   PT G-Codes **NOT FOR INPATIENT CLASS** Functional Assessment Tool Used: AM-PAC 6 Clicks  Basic Mobility Functional Limitation: Mobility: Walking and moving around Mobility: Walking and Moving Around Current Status (U2025): At least 20 percent but less than 40 percent impaired, limited or restricted Mobility: Walking and Moving Around Goal Status [phone]): At least 1 percent but less than 20 percent impaired, limited or restricted    Elizabeth Palau, PT, DPT [phone]   Kimoni Pagliarulo 08/15/2017, 11:03 AM

## 2017-08-15 NOTE — Progress Notes (Signed)
Patient feeling much better with his back pain post kyphoplasty. Will follow up in 2 weeks in office. No restrictions on activity.

## 2017-08-15 NOTE — Progress Notes (Signed)
PHARMACIST - PHYSICIAN COMMUNICATION  CONCERNING:  Enoxaparin (Lovenox) for DVT Prophylaxis    RECOMMENDATION: Patient was prescribed enoxaprin 40mg  q24 hours for VTE prophylaxis.   Filed Weights   08/10/17 1955 08/11/17 0204 08/14/17 1100  Weight: 230 lb (104.3 kg) 204 lb (92.5 kg) 204 lb (92.5 kg)    Body mass index is 26.19 kg/m.  Estimated Creatinine Clearance: 24.5 mL/min (A) (by C-G formula based on SCr of 2.94 mg/dL (H)).   Patient is candidate for enoxaparin 30mg  every 24 hours based on CrCl <26ml/min  DESCRIPTION: Pharmacy has adjusted enoxaparin dose.  Patient is now receiving enoxaparin 30mg  every 24 hours.  Nancy Fetter, PharmD Clinical Pharmacist  08/15/2017 7:46 AM

## 2017-08-15 NOTE — Progress Notes (Signed)
Pt is being discharged today, discharge instructions reviewed with him and his wife. Discharge teaching on his foley was done with him and his wife as well. They verified with teach back. Pt was handed 2 paper prescriptions. All belongings were packed and returned to the patient. IV x1 removed. He was rolled out in a wheelchair by staff.

## 2017-08-15 NOTE — Care Management (Signed)
Discharge to home today per Dr. Anselm Jungling. Physical therapy is recommending outpatient services. Spoke with Mr. And Ms Lundberg at the bedside. Would like to come to [facility] for therapy. Will get Dr. Nichola Sizer signature and fax to rehabilitation department Shelbie Ammons RN MSN CCM Care Management [phone]

## 2017-08-18 ENCOUNTER — Other Ambulatory Visit: Payer: Self-pay | Admitting: *Deleted

## 2017-08-18 ENCOUNTER — Ambulatory Visit: Payer: PPO | Admitting: *Deleted

## 2017-08-18 NOTE — Patient Outreach (Signed)
[facility]) Care Management  08/18/2017  [name] Jul 30, 1941 [id number]   Transition of care call  Patient admitted on 10/28 to [facility]  with Dx: pneumonia, UTI, Kyphoplasty Discharged on  11/2   Placed call to patient contact number, no answer able to leave a HIPAA compliant message requesting a return call.  Plan Will await return call if no response will plan outreach call in the next day.    [name], RN, [place] Management Coordinator  [phone]- Mobile [phone]- Toll Free Main Office

## 2017-08-18 NOTE — Anesthesia Postprocedure Evaluation (Signed)
Anesthesia Post Note  Patient: [name]  Procedure(s) Performed: Coralyn Helling (N/A Spine Thoracic)  Patient location during evaluation: PACU Anesthesia Type: General Level of consciousness: awake and alert Pain management: pain level controlled Vital Signs Assessment: post-procedure vital signs reviewed and stable Respiratory status: spontaneous breathing, nonlabored ventilation, respiratory function stable and patient connected to nasal cannula oxygen Cardiovascular status: blood pressure returned to baseline and stable Postop Assessment: no apparent nausea or vomiting Anesthetic complications: no     Last Vitals:  Vitals:   08/14/17 2001 08/15/17 0728  BP: 134/73 (!) 147/85  Pulse: 71 61  Resp: 16 20  Temp: 36.4 C 36.6 C  SpO2: 94% 99%    Last Pain:  Vitals:   08/15/17 0901  TempSrc:   PainSc: 0-No pain                 Molli Barrows

## 2017-08-19 ENCOUNTER — Telehealth: Payer: Self-pay | Admitting: Urology

## 2017-08-19 ENCOUNTER — Other Ambulatory Visit: Payer: Self-pay | Admitting: *Deleted

## 2017-08-19 NOTE — Patient Outreach (Signed)
[facility]) Care Management  08/19/2017  AYINDE SWIM 1941-02-07 [id number]   Transition of care call  Placed call to patient, no answer, able to leave a HIPAA compliant message requesting a return call'  Plan  Will await return call if no response today will plan outreach call on next day.   [name], RN, [place] Management Coordinator  [phone]- Mobile [phone]- Toll Free Main Office

## 2017-08-19 NOTE — Telephone Encounter (Signed)
-----   Message from Festus Aloe, MD sent at 08/12/2017 12:00 PM EDT ----- Pt needs f/u in 7-14 days for voiding trial and to schedule right ureteroscopy.

## 2017-08-19 NOTE — Telephone Encounter (Signed)
done

## 2017-08-20 ENCOUNTER — Other Ambulatory Visit: Payer: Self-pay | Admitting: Internal Medicine

## 2017-08-20 ENCOUNTER — Other Ambulatory Visit: Payer: Self-pay | Admitting: *Deleted

## 2017-08-20 NOTE — Discharge Summary (Signed)
[facility] [place] NAME: [name]    MR#:  [id number]  DATE OF BIRTH:  1941/03/16  DATE OF ADMISSION:  08/10/2017 ADMITTING PHYSICIAN: Gorden Harms, MD  DATE OF DISCHARGE: 08/15/2017  2:20 PM  PRIMARY CARE PHYSICIAN: Glean Hess, MD    ADMISSION DIAGNOSIS:  AKI (acute kidney injury) ([place]) [N17.9]  DISCHARGE DIAGNOSIS:  Active Problems:   CAP (community acquired pneumonia)   SECONDARY DIAGNOSIS:   Past Medical History:  Diagnosis Date  . Blood transfusion without reported diagnosis   . Cancer [facility])    Colon  . Coronary artery disease   . Diabetes mellitus without complication ([place])   . Dysrhythmia   . Hyperlipidemia   . Hypertension   . Myocardial infarction ([place]) 1997  . Peripheral vascular disease ([place])   . Pneumonia 2014  . Stroke ([place]) 05/2017  . Thyroid disease     HOSPITAL COURSE:   1acute right lower lobe pneumonia Most likely secondary to aspiration given problems with swallowing since right carotid endarterectomy July 18, 2017 Given Zosyn, aspiration precautions, speech therapy to evaluate/treat, head of the bed at 30 degrees at all times, IV fluids for rehydration, and follow-up on cultures  SLP eval done by nursing, pt have no difficulties.  Switch to Unasyn.  2acute urinary retention with Ac on ch renal failure. 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  3acute abnormal CT scan of the abdomen/pelvis Concerning for right renal pelvic lesion/ TTC Plan of care as stated above  4 acute/subacute T12 compression fraction Noted 50% loss of height Occurred over a week ago,   Foley  placed for urinary retention,   Kyphoplasty done 08/14/17  5acute hyperglycemia with chronic diabetes mellitus type 2 Most likely exacerbated by pneumonia and a urinary tract infection Continue home insulin regimen, start sliding scale insulin with Accu-Cheks per routine, checked hemoglobin A1c- 7.1 , cardiac/ADA diet, IV fluids for rehydration, make changes as per necessary  6acute urinary tract infection Antibiotics per above, IV fluids for rehydration, follow-up on cultures   Changed to unasyn for now.  7acute kidney injury/acute renal failure with CKD Baseline creatinine between 2.8-3 Most likely secondary to acute dehydration/poor p.o. intake Avoid nephrotoxic agents, strict I&O monitoring, BMP daily, IV fluids for rehydration, improved.    DISCHARGE CONDITIONS:  Stable.  CONSULTS OBTAINED:  Treatment Team:  Loura Back, MD Lucas Mallow, MD Hessie Knows, MD  DRUG ALLERGIES:   Allergies  Allergen Reactions  . Ace Inhibitors Other (See Comments)    Reaction:  Raises potassium   . Quinapril Rash    hyperkalemia    DISCHARGE MEDICATIONS:   Discharge Medication List as of 08/15/2017  1:22 PM    START taking these medications   Details  amoxicillin-clavulanate (AUGMENTIN) 875-125 MG tablet Take 1 tablet by mouth 2 (two) times daily., Starting Fri 08/15/2017, Until Tue 08/19/2017, Print    docusate sodium (COLACE) 100 MG capsule Take 1 capsule (100 mg total) by mouth 2 (two) times daily., Starting Fri 08/15/2017, Normal    polyethylene glycol (MIRALAX / GLYCOLAX) packet Take 17 g by mouth daily as needed for mild  constipation., Starting Fri 08/15/2017, Normal    tamsulosin (FLOMAX) 0.4 MG CAPS capsule Take 1 capsule (0.4 mg total) by mouth daily after breakfast., Starting Sat 08/16/2017, Print      CONTINUE these medications which have CHANGED   Details  insulin NPH Human (HUMULIN N,NOVOLIN N) 100 UNIT/ML injection Inject 0.25  mLs (25 Units total) into the skin 2 (two) times daily., Starting Fri 08/15/2017, Normal      CONTINUE these medications which have NOT CHANGED   Details  aspirin EC 81 MG EC tablet Take 1 tablet (81 mg total) by mouth daily., Starting Sat 07/19/2017, Print    atenolol (TENORMIN) 50 MG tablet Take 1 tablet (50 mg total) by mouth daily., Starting Fri 08/08/2017, Normal    atorvastatin (LIPITOR) 40 MG tablet Take 1 tablet (40 mg total) by mouth daily at 6 PM., Starting Sat 06/14/2017, Print    Blood Glucose Monitoring Suppl (RELION PRIME MONITOR) DEVI Starting 01/29/2014, Until Discontinued, Historical Med    clopidogrel (PLAVIX) 75 MG tablet Take 1 tablet (75 mg total) by mouth daily., Starting Fri 08/08/2017, Normal    glucose blood (RELION PRIME TEST) test strip Historical Med    insulin regular (NOVOLIN R,HUMULIN R) 100 units/mL injection Inject 10-15 Units into the skin 2 (two) times daily. TAKES 15 UNITS IN THE MORNING AND 10 UNITS WITH SUPPER, Historical Med    levothyroxine (SYNTHROID, LEVOTHROID) 300 MCG tablet Take 300 mcg by mouth daily before breakfast., Until Discontinued, Historical Med    mupirocin cream (BACTROBAN) 2 % Apply 1 application topically 2 (two) times daily., Starting Mon 07/22/2016, Normal    oxyCODONE-acetaminophen (PERCOCET) 7.5-325 MG tablet Take 1 tablet by mouth every 6 (six) hours as needed for severe pain., Starting Mon 07/28/2017, Print    sodium bicarbonate 650 MG tablet Take 1 tablet by mouth 2 (two) times daily., Starting Mon 04/28/2017, Until Tue 04/28/2018, Historical Med      STOP taking these medications     oxyCODONE-acetaminophen (PERCOCET/ROXICET) 5-325 MG tablet          DISCHARGE INSTRUCTIONS:    Follow with PMD in 1-2 weeks.  If you experience worsening of your admission symptoms, develop shortness of breath, life threatening emergency, suicidal or homicidal thoughts you must seek medical attention immediately by calling 911 or calling  your MD immediately  if symptoms less severe.  You Must read complete instructions/literature along with all the possible adverse reactions/side effects for all the Medicines you take and that have been prescribed to you. Take any new Medicines after you have completely understood and accept all the possible adverse reactions/side effects.   Please note  You were cared for by a hospitalist during your hospital stay. If you have any questions about your discharge medications or the care you received while you were in the hospital after you are discharged, you can call the unit and asked to speak with the hospitalist on call if the hospitalist that took care of you is not available. Once you are discharged, your primary care physician will handle any further medical issues. Please note that NO REFILLS for any discharge medications will be authorized once you are discharged, as it is imperative that you return to your primary care physician (or establish a relationship with a primary care physician if you do not have one) for your aftercare needs so that they can reassess your need for medications and monitor your lab values.    Today   CHIEF COMPLAINT:   Chief  Complaint  Patient presents with  . Weakness  . Emesis    HISTORY OF PRESENT ILLNESS:  [name]  is a 76 y.o. male presents to the emergency room with a number of complaints per ED attending/documentation in the patient, the primary problem is his continued back pain from recent fall approximately a week ago resulting in T12 fracture-currently being followed/managed by Dr. Jefm Petty surgery for which is not possible evaluation for vertebroplasty by [facility] in the near future, patient states that he is unable to ambulate, has difficulty with his bowels as well as bladder function, currently was unable to [facility] placed in the emergency room for acute urinary retention, had also complained of generalized weakness/fatigue,  decreased p.o. intake, hoarseness since his recent right carotid endarterectomy on 18 July 2017, since his endarterectomy the patient has had problems with swallowing/dysphagia, patient is also complaining of nausea and intermittent episodes of emesis, family currently not available but were concerned that the patient may be dehydrated, in the emergency room initial blood pressure was 90/60 with heart rate of 51-currently normal, sodium 129, potassium 5.2, bicarb 18, creatinine 5.3 with baseline 2.8-3, glucose 333, white count 11,000, EKG benign, urinalysis concerning for UTI, CT of the abdomen done noted for multiple abnormalities-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  Patient evaluated in the emergency room, in no apparent distress, resting comfortably in bed, no available family for comment, patient complaining of back pain only, Foley placed in the emergency room and draining dark urine, patient is now being admitted for acute right lower lobe pneumonia, acute urinary tract infection with associated urinary retention, subacute T12 compression fracture, abnormal CT scan of the abdomen and pelvis, acute kidney injury with chronic kidney disease, and hyperglycemia with chronic diabetes mellitus type 2.      VITAL SIGNS:  Blood pressure (!) 147/85, pulse 61, temperature 97.8 F (36.6 C), temperature source Oral, resp. rate 20, height 6\' 2"  (1.88 m), weight 92.5 kg (204 lb), SpO2 99 %.  I/O:  No intake or output data in the 24 hours ending 08/20/17 1321  PHYSICAL EXAMINATION:  GENERAL:  76 y.o.-year-old patient lying in the bed with no acute distress.  EYES: Pupils equal, round, reactive to light and accommodation. No scleral icterus. Extraocular  muscles intact.  HEENT: Head atraumatic, normocephalic. Oropharynx and nasopharynx clear.  NECK:  Supple, no jugular venous distention. No thyroid enlargement, no tenderness.  LUNGS: Normal breath sounds bilaterally, no wheezing, rales,rhonchi or crepitation. No use of accessory muscles of respiration.  CARDIOVASCULAR: S1, S2 normal. No murmurs, rubs, or gallops.  ABDOMEN: Soft, non-tender, non-distended. Bowel sounds present. No organomegaly or mass.  EXTREMITIES: No pedal edema, cyanosis, or clubbing.  NEUROLOGIC: Cranial nerves II through XII are intact. Muscle strength 5/5 in all extremities. Sensation intact. Gait not checked.  PSYCHIATRIC: The patient is alert and oriented x 3.  SKIN: No obvious rash, lesion, or ulcer.   DATA REVIEW:   CBC No results for input(s): WBC, HGB, HCT, PLT in the last 168 hours.  Chemistries  Recent Labs  Lab 08/15/17 0947  NA 135  K 3.8  CL 106  CO2 21*  GLUCOSE 210*  BUN 28*  CREATININE 2.61*  CALCIUM 8.2*    Cardiac Enzymes No results for input(s): TROPONINI in the last 168 hours.  Microbiology Results  Results for orders placed or performed during the hospital encounter of 08/10/17  Urine culture     Status: Abnormal   Collection Time: 08/10/17  9:55 PM  Result Value Ref Range Status   Specimen Description URINE, CATHETERIZED  Final   Special Requests NONE  Final   Culture >=100,000 COLONIES/mL ENTEROCOCCUS FAECALIS (A)  Final   Report Status 08/13/2017 FINAL  Final   Organism ID, Bacteria ENTEROCOCCUS FAECALIS (A)  Final      Susceptibility   Enterococcus faecalis - MIC*    AMPICILLIN <=2 SENSITIVE Sensitive     LEVOFLOXACIN 1 SENSITIVE Sensitive     NITROFURANTOIN <=16 SENSITIVE Sensitive     VANCOMYCIN 1 SENSITIVE Sensitive     * >=100,000 COLONIES/mL ENTEROCOCCUS FAECALIS  Culture, blood (routine x 2)     Status: None   Collection Time: 08/10/17 11:15 PM  Result Value Ref Range Status   Specimen Description BLOOD LEFT  ANTECUBITAL  Final   Special Requests   Final    BOTTLES DRAWN AEROBIC AND ANAEROBIC Blood Culture adequate volume   Culture NO GROWTH 5 DAYS  Final   Report Status 08/15/2017 FINAL  Final  Culture, blood (routine x 2)     Status: None   Collection Time: 08/10/17 11:15 PM  Result Value Ref Range Status   Specimen Description BLOOD LEFT HAND  Final   Special Requests   Final    BOTTLES DRAWN AEROBIC AND ANAEROBIC Blood Culture adequate volume   Culture NO GROWTH 5 DAYS  Final   Report Status 08/15/2017 FINAL  Final  MRSA PCR Screening     Status: None   Collection Time: 08/14/17  1:18 AM  Result Value Ref Range Status   MRSA by PCR NEGATIVE NEGATIVE Final    Comment:        The GeneXpert MRSA Assay (FDA approved for NASAL specimens only), is one component of a comprehensive MRSA colonization surveillance program. It is not intended to diagnose MRSA infection nor to guide or monitor treatment for MRSA infections.     RADIOLOGY:  No results found.  EKG:   Orders placed or performed during the hospital encounter of 08/10/17  . EKG 12-Lead  . EKG 12-Lead      Management plans discussed with the patient, family and they are in agreement.  CODE STATUS:  Code Status History    Date Active Date Inactive Code Status Order ID Comments User Context   08/11/2017 02:00 08/15/2017 17:41 DNR [id number]  Gorden Harms, MD Inpatient   07/17/2017 18:37 07/18/2017 18:55 Full Code [id number]  Algernon Huxley, MD Inpatient   06/11/2017 22:31 06/14/2017 16:38 Full Code [id number]  Harvie Bridge, DO Inpatient   12/25/2016 15:31 12/26/2016 16:31 DNR [id number]  Hillary Bow, MD ED   04/23/2016 13:20 04/26/2016 22:17 Full Code [id number]  Jeanie Cooks, MD Inpatient   04/21/2016 02:13 04/23/2016 13:20 Full Code [id number]  Gonczy, [place], MD ED    Questions for Most Recent Historical Code Status (Order [id number])    Question Answer Comment   In the event of cardiac or respiratory ARREST Do not call  a "code blue"    In the event of cardiac or respiratory ARREST Do not perform Intubation, CPR, defibrillation or ACLS    In the event of cardiac or respiratory ARREST Use medication by any route, position, wound care, and other measures to relive pain and suffering. May use oxygen, suction and manual treatment of airway obstruction as needed for comfort.  TOTAL TIME TAKING CARE OF THIS PATIENT: 35 minutes.    Vaughan Basta M.D on 08/20/2017 at 1:21 PM  Between 7am to 6pm - Pager - [phone]  After 6pm go to [email or web address] - password [email or web address] [facility] [facility]  Office  [phone]  CC: Primary care physician; Glean Hess, MD   Note: This dictation was prepared with Dragon dictation along with smaller phrase technology. Any transcriptional errors that result from this process are unintentional.

## 2017-08-20 NOTE — Patient Outreach (Signed)
[facility]) Care Management  08/20/2017  [name] 06/04/1941 [id number]   Patient admitted to [facility] on 10/28 DX: AKI , Pneumonia, UTI, Kyphoplasty ( recent fall and T12 fracture ) Discharged home on 11/2.  Placed transition of care call to patient, he still remain hoarse and voice at a whisper, he then passes phone to his wife.  Wife reports patient appetite is good and he is eating 3 meals a day, denies swallowing difficulties, reports foods taste better.  Patient;s voice is still hoarse she reports that they have not heard from ENT for referral .  Patient has foley catheter in place reports urine is clear in the bag, they are keeping the foley bag off the floor and below level of bladder, wife discussed follow up visit to urologist planned for Friday.   Patient is checking blood sugars and at least twice daily and readings in the 100 range, no low blood sugar symptoms or readings.   Patient reports his daughter has filled pill organizer with his daily medications and he likes that system better.  Patient reports he is walking at least 3 laps from living room and around the kitchen each day, he has had not received phone call regarding outpatient physical therapy, patient states he would be willing to participate because it would help him to get stronger and hopefully not have to go back to hospital .  Family is assisting with transportation to appointments at this time.  Discussed with patient wife regarding scheduling follow up appointment with Dr.Beguland.  Outpatient Encounter Medications as of 08/20/2017  Medication Sig  . aspirin EC 81 MG EC tablet Take 1 tablet (81 mg total) by mouth daily.  Marland Kitchen atenolol (TENORMIN) 50 MG tablet Take 1 tablet (50 mg total) by mouth daily.  Marland Kitchen atorvastatin (LIPITOR) 40 MG tablet Take 1 tablet (40 mg total) by mouth daily at 6 PM. (Patient taking differently: Take 40 mg by mouth daily. )  . Blood Glucose Monitoring Suppl ([place]) DEVI   . clopidogrel (PLAVIX) 75 MG tablet Take 1 tablet (75 mg total) by mouth daily.  Marland Kitchen docusate sodium (COLACE) 100 MG capsule Take 1 capsule (100 mg total) by mouth 2 (two) times daily.  Marland Kitchen glucose blood (RELION PRIME TEST) test strip   . insulin NPH Human (HUMULIN N,NOVOLIN N) 100 UNIT/ML injection Inject 0.25 mLs (25 Units total) into the skin 2 (two) times daily.  . insulin regular (NOVOLIN R,HUMULIN R) 100 units/mL injection Inject 10-15 Units into the skin 2 (two) times daily. TAKES 15 UNITS IN THE MORNING AND 10 UNITS WITH SUPPER  . levothyroxine (SYNTHROID, LEVOTHROID) 300 MCG tablet Take 300 mcg by mouth daily before breakfast.  . oxyCODONE-acetaminophen (PERCOCET) 7.5-325 MG tablet Take 1 tablet by mouth every 6 (six) hours as needed for severe pain.  . polyethylene glycol (MIRALAX / GLYCOLAX) packet Take 17 g by mouth daily as needed for mild constipation.  . sodium bicarbonate 650 MG tablet Take 1 tablet by mouth 2 (two) times daily.  . tamsulosin (FLOMAX) 0.4 MG CAPS capsule Take 1 capsule (0.4 mg total) by mouth daily after breakfast.  . mupirocin cream (BACTROBAN) 2 % Apply 1 application topically 2 (two) times daily. (Patient not taking: Reported on 07/21/2017)   No facility-administered encounter medications on file as of 08/20/2017.     Plan Will continue weekly transition of care outreaches, scheduled home visit in the next week.  Will place care coordination call to follow  up on outpatient physical therapy [facility]  as noted by inpatient care manager. Will place care coordination call to follow up on ENT referral appointment.    THN CM Care Plan Problem One     Most Recent Value  Care Plan Problem One  Patient with recent hospital admission related to Acute Kidney injury, Pneumonia, UTI and urine retention   Role Documenting the Problem One  Care Management Coordinator  Care Plan for Problem One  Active  THN Long Term Goal   Patient will not  experience a hospital admission in the next 31 days   THN Long Term Goal Start Date  08/20/17 Barrie Folk restarted due to readmission ]  Interventions for Problem One Long Term Goal  Advised regarding following discharge instructions, reviewed instructions  including foley care , taking mediications as prescribed.   THN CM Short Term Goal #1   Patient will attend all medical appointments in the next 30 days   THN CM Short Term Goal #1 Start Date  08/20/17  Interventions for Short Term Goal #1  Advised regarding the importance of making PCP appointment for post hospital discharge and attending all scheduled speciality visits   [facility] CM Short Term Goal #2   Patient will not experince a fall in the next 30 days   THN CM Short Term Goal #2 Start Date  08/20/17  Interventions for Short Term Goal #2  RNCM to place care coordination call to follow up on outpatient therapy, reinforced to use walker at all times .      [name], RN, [place] Management Coordinator  [phone]- Mobile [phone]- Toll Free Main Office

## 2017-08-21 ENCOUNTER — Other Ambulatory Visit: Payer: Self-pay | Admitting: *Deleted

## 2017-08-21 NOTE — Patient Outreach (Signed)
[facility]) Care Management  08/21/2017  [name] 1941-03-31 [id number]   Care Coordination call   Placed call to [place] office to follow on patient referral appointment, representative states she is unable to locate referral from [facility], [place] .  Placed call to Dr.Dew office regarding  referral spoke with April states referral sent through [facility] on 10/22, they will manually fax referral again.   Placed call to [facility] outpatient physical therapy , to follow up on patient orders for outpatient physical therapy , reports that they have not received orders, also spoke with Jeannie Fend, inpatient care manager to discuss she states order was faxed to outpatient.  Placed call to Dr.Menz office regarding orders for outpatient PT, spoke with representative that will send message to MD to follow up .   Plan Will plan follow up visit with patient as planned.  [name], RN, [place] Management Coordinator  [phone]- Mobile [phone]- Toll Free Main Office

## 2017-08-22 ENCOUNTER — Encounter: Payer: Self-pay | Admitting: Urology

## 2017-08-22 ENCOUNTER — Ambulatory Visit: Payer: PPO | Admitting: Urology

## 2017-08-22 VITALS — BP 92/56 | HR 80 | Ht 74.0 in

## 2017-08-22 DIAGNOSIS — D49519 Neoplasm of unspecified behavior of unspecified kidney: Secondary | ICD-10-CM

## 2017-08-22 DIAGNOSIS — R310 Gross hematuria: Secondary | ICD-10-CM

## 2017-08-22 DIAGNOSIS — R339 Retention of urine, unspecified: Secondary | ICD-10-CM

## 2017-08-22 NOTE — Progress Notes (Signed)
Catheter Removal  Patient is present today for a catheter removal.  34ml of water was drained from the balloon. A 16FR foley cath was removed from the bladder no complications were noted . Patient tolerated well.  Preformed by: Elberta Leatherwood, CMA  Cath Change/ Replacement  Patient is present today for a catheter  due to urinary retention.    Patient was cleaned and prepped in a sterile fashion with betadine and 2% lidocaine jelly was instilled into the urethra. A 16  FR foley cath was replaced into the bladder no complications were noted Urine return was noted 559ml and urine was yellow in color. The balloon was filled with 35ml of sterile water. A night bag was attached for drainage. . Patient was given proper instruction on catheter care.    Preformed by: Elberta Leatherwood, CMA

## 2017-08-22 NOTE — H&P (View-Only) (Signed)
08/22/2017 8:30 AM   [name] 01-25-1941 [id number]  Referring provider: Glean Hess, MD [address] [place], [place] [address]  Chief Complaint  Patient presents with  . Urinary Retention    HPI: 76 year old white male admitted for follow up of urinary retention and right renal pelvis mass.  He underwent a right carotid endarterectomy at the beginning of the October 2018 and then fell resulting in a fracture of T12 a few weeks ago.   Over the month he has had progressive weak stream and hesitancy and feeling of incomplete emptying.  He underwent CT scan which showed a distended bladder and small bilateral bladder diverticula and a small prostate.  He also had acute kidney injury which looked to be prerenal.  Baseline creatinine around 2.8 or 3.  Patient reports what sounds like an open simple prostatectomy in the mid 90s which is likely why the prostate looks small on the CT yet he has diverticula.  A Foley catheter was placed and he is made good urine now that he has been resuscitated.  He is on tamsulosin.  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  Patient recalls gross hematuria after starting Plavix following the carotid endarterectomy earlier this month. Urine was red.  He has not had clots.  Urine is light pink currently.  He presents today for a trial of void also to discuss right ureteroscopy for further evaluation of the right renal pelvic mass.   PMH: Past Medical History:  Diagnosis Date  . Blood transfusion without reported diagnosis   . Cancer [facility])    Colon  . Coronary artery disease   . Diabetes mellitus without complication ([place])   . Dysrhythmia   . Hyperlipidemia   . Hypertension   . Myocardial infarction ([place]) 1997  . Peripheral vascular disease ([place])   . Pneumonia 2014  . Stroke ([place]) 05/2017  . Thyroid disease     Surgical History: Past Surgical History:  Procedure Laterality Date  . APPENDECTOMY  2011  .  BACK SURGERY    . CARDIAC CATHETERIZATION    . COLON SURGERY  2011   Colectomy for ileo-cecal valve cancer  . CORONARY ARTERY BYPASS GRAFT  1997   x 3  . HERNIA REPAIR  2011   Ventral hernia  . KNEE SURGERY Left   . PROSTATE SURGERY  2002   BPH benign pathology  . SPINE SURGERY  1989   Lumbar disc    Home Medications:  Allergies as of 08/22/2017      Reactions   Ace Inhibitors Other (See Comments)   Reaction:  Raises potassium    Quinapril Rash   hyperkalemia      Medication List        Accurate as of 08/22/17  8:30 AM. Always use your most recent med list.          aspirin 81 MG EC tablet Take 1 tablet (81 mg total) by mouth daily.   atenolol 50 MG tablet Commonly known as:  TENORMIN Take 1 tablet (50 mg total) by mouth daily.   atorvastatin 40 MG tablet Commonly known as:  LIPITOR Take 1 tablet (40 mg total) by mouth daily at 6 PM.   clopidogrel 75 MG tablet Commonly known as:  PLAVIX Take 1 tablet (75 mg total) by mouth daily.   docusate sodium 100 MG capsule Commonly known as:  COLACE Take 1 capsule (100 mg total) by mouth  2 (two) times daily.   insulin NPH Human 100 UNIT/ML injection Commonly known as:  HUMULIN N,NOVOLIN N Inject 0.25 mLs (25 Units total) into the skin 2 (two) times daily.   insulin regular 100 units/mL injection Commonly known as:  NOVOLIN R,HUMULIN R Inject 10-15 Units into the skin 2 (two) times daily. TAKES 15 UNITS IN THE MORNING AND 10 UNITS WITH SUPPER   levothyroxine 300 MCG tablet Commonly known as:  SYNTHROID, LEVOTHROID Take 300 mcg by mouth daily before breakfast.   polyethylene glycol packet Commonly known as:  MIRALAX / GLYCOLAX Take 17 g by mouth daily as needed for mild constipation.   RELION PRIME MONITOR Devi   RELION PRIME TEST test strip Generic drug:  glucose blood   simvastatin 20 MG tablet Commonly known as:  ZOCOR   sodium bicarbonate 650 MG tablet Take 1 tablet by mouth 2 (two) times daily.     tamsulosin 0.4 MG Caps capsule Commonly known as:  FLOMAX Take 1 capsule (0.4 mg total) by mouth daily after breakfast.       Allergies:  Allergies  Allergen Reactions  . Ace Inhibitors Other (See Comments)    Reaction:  Raises potassium   . Quinapril Rash    hyperkalemia    Family History: Family History  Problem Relation Age of Onset  . Cancer Mother        Lung  . Diabetes Mother   . Heart disease Mother   . Heart disease Father     Social History:  reports that he quit smoking about 21 years ago. His smoking use included cigarettes. he has never used smokeless tobacco. He reports that he does not drink alcohol or use drugs.  ROS: UROLOGY Frequent Urination?: No Hard to postpone urination?: No Burning/pain with urination?: No Get up at night to urinate?: Yes Leakage of urine?: No Urine stream starts and stops?: No Trouble starting stream?: No Do you have to strain to urinate?: No Blood in urine?: Yes Urinary tract infection?: No Sexually transmitted disease?: No Injury to kidneys or bladder?: No Painful intercourse?: No Weak stream?: No Erection problems?: No Penile pain?: No  Gastrointestinal Nausea?: No Vomiting?: No Indigestion/heartburn?: No Diarrhea?: No Constipation?: No  Constitutional Fever: No Night sweats?: No Weight loss?: No Fatigue?: No  Skin Skin rash/lesions?: No Itching?: No  Eyes Blurred vision?: No Double vision?: No  Ears/Nose/Throat Sore throat?: No Sinus problems?: No  Hematologic/Lymphatic Swollen glands?: No Easy bruising?: No  Cardiovascular Leg swelling?: No Chest pain?: No  Respiratory Cough?: No Shortness of breath?: No  Endocrine Excessive thirst?: No  Musculoskeletal Back pain?: No Joint pain?: No  Neurological Headaches?: No Dizziness?: No  Psychologic Depression?: No Anxiety?: No  Physical Exam: BP (!) 92/56 (BP Location: Right Arm, Patient Position: Sitting, Cuff Size: Normal)    Pulse 80   Ht 6\' 2"  (1.88 m)   BMI 26.19 kg/m   Constitutional:  Alert and oriented, No acute distress. HEENT: [place] AT, moist mucus membranes.  Trachea midline, no masses. Cardiovascular: No clubbing, cyanosis, or edema. Respiratory: Normal respiratory effort, no increased work of breathing. GI: Abdomen is soft, nontender, nondistended, no abdominal masses GU: No CVA tenderness.  Foley in place.  Clear yellow urine. Skin: No rashes, bruises or suspicious lesions. Lymph: No cervical or inguinal adenopathy. Neurologic: Grossly intact, no focal deficits, moving all 4 extremities. Psychiatric: Normal mood and affect.  Laboratory Data: Lab Results  Component Value Date   WBC 5.8 08/13/2017   HGB 12.8 (  L) 08/13/2017   HCT 39.1 (L) 08/13/2017   MCV 91.5 08/13/2017   PLT 130 (L) 08/13/2017    Lab Results  Component Value Date   CREATININE 2.61 (H) 08/15/2017    Lab Results  Component Value Date   PSA <0.1 05/16/2014    No results found for: TESTOSTERONE  Lab Results  Component Value Date   HGBA1C 7.1 (H) 08/11/2017    Urinalysis    Component Value Date/Time   COLORURINE YELLOW (A) 08/10/2017 2155   APPEARANCEUR TURBID (A) 08/10/2017 2155   LABSPEC 1.010 08/10/2017 2155   PHURINE 5.0 08/10/2017 2155   GLUCOSEU >=500 (A) 08/10/2017 2155   HGBUR MODERATE (A) 08/10/2017 2155   BILIRUBINUR NEGATIVE 08/10/2017 2155   [place] NEGATIVE 08/10/2017 2155   PROTEINUR 100 (A) 08/10/2017 2155   NITRITE NEGATIVE 08/10/2017 2155   LEUKOCYTESUR LARGE (A) 08/10/2017 2155    Assessment & Plan:    1.  Urinary retention We will have the patient undergo a trial of void today.  He will continue his Flomax.  2.  Right renal pelvis mass/gross hematuria I discussed with the patient that he has a renal pelvic mass on his CT on the right side.  His CT was also performed without contrast, and he has not had a formal cystoscopy.  We discussed cystoscopy, bilateral retrograde pyelogram,  right ureteroscopy, right renal pelvis tumor biopsy, laser ablation of tumor, and right ureteral stent placement in great detail.  This will allow completion of his gross hematuria workup as well as further evaluation of the renal pelvis mass on the right side.  We did discuss my concern that this may be a malignancy.  He does have significant comorbidities.  In the last 6 weeks he has had a carotid endarterectomy as well as back surgery for thoracic spine fracture.  He is currently off his Plavix due to his gross hematuria.  He currently is on aspirin.  Ideally we would have him come off the ASA if cleared by his vascular surgeon.  If not, we will have to proceed with him on it.  We discussed the risks, benefits, and indications of this procedure.  He understands the risks include but are not limited to bleeding, infection, iatrogenic injury, need for repeat procedure, and need for right ureteral stent.  All questions were answered.  The patient has elected to proceed.   Nickie Retort, MD  [facility] [address] [place], [place] [address] [phone]

## 2017-08-22 NOTE — Progress Notes (Signed)
08/22/2017 8:30 AM   [name] Jun 19, 1941 [id number]  Referring provider: Glean Hess, MD [address] [place], [place] [address]  Chief Complaint  Patient presents with  . Urinary Retention    HPI: 76 year old white male admitted for follow up of urinary retention and right renal pelvis mass.  He underwent a right carotid endarterectomy at the beginning of the October 2018 and then fell resulting in a fracture of T12 a few weeks ago.   Over the month he has had progressive weak stream and hesitancy and feeling of incomplete emptying.  He underwent CT scan which showed a distended bladder and small bilateral bladder diverticula and a small prostate.  He also had acute kidney injury which looked to be prerenal.  Baseline creatinine around 2.8 or 3.  Patient reports what sounds like an open simple prostatectomy in the mid 90s which is likely why the prostate looks small on the CT yet he has diverticula.  A Foley catheter was placed and he is made good urine now that he has been resuscitated.  He is on tamsulosin.  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  Patient recalls gross hematuria after starting Plavix following the carotid endarterectomy earlier this month. Urine was red.  He has not had clots.  Urine is light pink currently.  He presents today for a trial of void also to discuss right ureteroscopy for further evaluation of the right renal pelvic mass.   PMH: Past Medical History:  Diagnosis Date  . Blood transfusion without reported diagnosis   . Cancer [facility])    Colon  . Coronary artery disease   . Diabetes mellitus without complication ([place])   . Dysrhythmia   . Hyperlipidemia   . Hypertension   . Myocardial infarction ([place]) 1997  . Peripheral vascular disease ([place])   . Pneumonia 2014  . Stroke ([place]) 05/2017  . Thyroid disease     Surgical History: Past Surgical History:  Procedure Laterality Date  . APPENDECTOMY  2011  .  BACK SURGERY    . CARDIAC CATHETERIZATION    . COLON SURGERY  2011   Colectomy for ileo-cecal valve cancer  . CORONARY ARTERY BYPASS GRAFT  1997   x 3  . HERNIA REPAIR  2011   Ventral hernia  . KNEE SURGERY Left   . PROSTATE SURGERY  2002   BPH benign pathology  . SPINE SURGERY  1989   Lumbar disc    Home Medications:  Allergies as of 08/22/2017      Reactions   Ace Inhibitors Other (See Comments)   Reaction:  Raises potassium    Quinapril Rash   hyperkalemia      Medication List        Accurate as of 08/22/17  8:30 AM. Always use your most recent med list.          aspirin 81 MG EC tablet Take 1 tablet (81 mg total) by mouth daily.   atenolol 50 MG tablet Commonly known as:  TENORMIN Take 1 tablet (50 mg total) by mouth daily.   atorvastatin 40 MG tablet Commonly known as:  LIPITOR Take 1 tablet (40 mg total) by mouth daily at 6 PM.   clopidogrel 75 MG tablet Commonly known as:  PLAVIX Take 1 tablet (75 mg total) by mouth daily.   docusate sodium 100 MG capsule Commonly known as:  COLACE Take 1 capsule (100 mg total) by mouth  2 (two) times daily.   insulin NPH Human 100 UNIT/ML injection Commonly known as:  HUMULIN N,NOVOLIN N Inject 0.25 mLs (25 Units total) into the skin 2 (two) times daily.   insulin regular 100 units/mL injection Commonly known as:  NOVOLIN R,HUMULIN R Inject 10-15 Units into the skin 2 (two) times daily. TAKES 15 UNITS IN THE MORNING AND 10 UNITS WITH SUPPER   levothyroxine 300 MCG tablet Commonly known as:  SYNTHROID, LEVOTHROID Take 300 mcg by mouth daily before breakfast.   polyethylene glycol packet Commonly known as:  MIRALAX / GLYCOLAX Take 17 g by mouth daily as needed for mild constipation.   RELION PRIME MONITOR Devi   RELION PRIME TEST test strip Generic drug:  glucose blood   simvastatin 20 MG tablet Commonly known as:  ZOCOR   sodium bicarbonate 650 MG tablet Take 1 tablet by mouth 2 (two) times daily.     tamsulosin 0.4 MG Caps capsule Commonly known as:  FLOMAX Take 1 capsule (0.4 mg total) by mouth daily after breakfast.       Allergies:  Allergies  Allergen Reactions  . Ace Inhibitors Other (See Comments)    Reaction:  Raises potassium   . Quinapril Rash    hyperkalemia    Family History: Family History  Problem Relation Age of Onset  . Cancer Mother        Lung  . Diabetes Mother   . Heart disease Mother   . Heart disease Father     Social History:  reports that he quit smoking about 21 years ago. His smoking use included cigarettes. he has never used smokeless tobacco. He reports that he does not drink alcohol or use drugs.  ROS: UROLOGY Frequent Urination?: No Hard to postpone urination?: No Burning/pain with urination?: No Get up at night to urinate?: Yes Leakage of urine?: No Urine stream starts and stops?: No Trouble starting stream?: No Do you have to strain to urinate?: No Blood in urine?: Yes Urinary tract infection?: No Sexually transmitted disease?: No Injury to kidneys or bladder?: No Painful intercourse?: No Weak stream?: No Erection problems?: No Penile pain?: No  Gastrointestinal Nausea?: No Vomiting?: No Indigestion/heartburn?: No Diarrhea?: No Constipation?: No  Constitutional Fever: No Night sweats?: No Weight loss?: No Fatigue?: No  Skin Skin rash/lesions?: No Itching?: No  Eyes Blurred vision?: No Double vision?: No  Ears/Nose/Throat Sore throat?: No Sinus problems?: No  Hematologic/Lymphatic Swollen glands?: No Easy bruising?: No  Cardiovascular Leg swelling?: No Chest pain?: No  Respiratory Cough?: No Shortness of breath?: No  Endocrine Excessive thirst?: No  Musculoskeletal Back pain?: No Joint pain?: No  Neurological Headaches?: No Dizziness?: No  Psychologic Depression?: No Anxiety?: No  Physical Exam: BP (!) 92/56 (BP Location: Right Arm, Patient Position: Sitting, Cuff Size: Normal)    Pulse 80   Ht 6\' 2"  (1.88 m)   BMI 26.19 kg/m   Constitutional:  Alert and oriented, No acute distress. HEENT: [place] AT, moist mucus membranes.  Trachea midline, no masses. Cardiovascular: No clubbing, cyanosis, or edema. Respiratory: Normal respiratory effort, no increased work of breathing. GI: Abdomen is soft, nontender, nondistended, no abdominal masses GU: No CVA tenderness.  Foley in place.  Clear yellow urine. Skin: No rashes, bruises or suspicious lesions. Lymph: No cervical or inguinal adenopathy. Neurologic: Grossly intact, no focal deficits, moving all 4 extremities. Psychiatric: Normal mood and affect.  Laboratory Data: Lab Results  Component Value Date   WBC 5.8 08/13/2017   HGB 12.8 (  L) 08/13/2017   HCT 39.1 (L) 08/13/2017   MCV 91.5 08/13/2017   PLT 130 (L) 08/13/2017    Lab Results  Component Value Date   CREATININE 2.61 (H) 08/15/2017    Lab Results  Component Value Date   PSA <0.1 05/16/2014    No results found for: TESTOSTERONE  Lab Results  Component Value Date   HGBA1C 7.1 (H) 08/11/2017    Urinalysis    Component Value Date/Time   COLORURINE YELLOW (A) 08/10/2017 2155   APPEARANCEUR TURBID (A) 08/10/2017 2155   LABSPEC 1.010 08/10/2017 2155   PHURINE 5.0 08/10/2017 2155   GLUCOSEU >=500 (A) 08/10/2017 2155   HGBUR MODERATE (A) 08/10/2017 2155   BILIRUBINUR NEGATIVE 08/10/2017 2155   [place] NEGATIVE 08/10/2017 2155   PROTEINUR 100 (A) 08/10/2017 2155   NITRITE NEGATIVE 08/10/2017 2155   LEUKOCYTESUR LARGE (A) 08/10/2017 2155    Assessment & Plan:    1.  Urinary retention We will have the patient undergo a trial of void today.  He will continue his Flomax.  2.  Right renal pelvis mass/gross hematuria I discussed with the patient that he has a renal pelvic mass on his CT on the right side.  His CT was also performed without contrast, and he has not had a formal cystoscopy.  We discussed cystoscopy, bilateral retrograde pyelogram,  right ureteroscopy, right renal pelvis tumor biopsy, laser ablation of tumor, and right ureteral stent placement in great detail.  This will allow completion of his gross hematuria workup as well as further evaluation of the renal pelvis mass on the right side.  We did discuss my concern that this may be a malignancy.  He does have significant comorbidities.  In the last 6 weeks he has had a carotid endarterectomy as well as back surgery for thoracic spine fracture.  He is currently off his Plavix due to his gross hematuria.  He currently is on aspirin.  Ideally we would have him come off the ASA if cleared by his vascular surgeon.  If not, we will have to proceed with him on it.  We discussed the risks, benefits, and indications of this procedure.  He understands the risks include but are not limited to bleeding, infection, iatrogenic injury, need for repeat procedure, and need for right ureteral stent.  All questions were answered.  The patient has elected to proceed.   Nickie Retort, MD  [facility] [address], [place], [place] [address] [phone]

## 2017-08-25 ENCOUNTER — Other Ambulatory Visit: Payer: Self-pay | Admitting: *Deleted

## 2017-08-25 ENCOUNTER — Other Ambulatory Visit: Payer: Self-pay | Admitting: Radiology

## 2017-08-25 ENCOUNTER — Telehealth: Payer: Self-pay

## 2017-08-25 MED ORDER — CEPHALEXIN 500 MG PO CAPS: 500.0000 mg | ORAL_CAPSULE | Freq: Three times a day (TID) | ORAL | 0 refills | Status: DC

## 2017-08-25 NOTE — Telephone Encounter (Signed)
Patient had a THN visit today at home- the [facility] coordinator called leaving VM to inform Dr Army Melia they checked patients pill box and found out the patient had been taking the wife's medication for losartan 100 mg once daily for the last week accidentally. There meds got mixed up; she took patients BP- 128/70 and Pulse was 65. No reports of dizziness episodes. She fixed the patient pill box and everything is now correct.   Verbally informed Dr Army Melia of this note.

## 2017-08-25 NOTE — Patient Outreach (Signed)
[facility]) Care Management   08/25/2017  WINN MUEHL 07-22-1941 [id number]  [name] is an 76 y.o. male  Subjective:  Patient reports feeling some better, reports back pain has gone since having kyphoplasty.  Discussed many upcoming medical appointments.   Objective:  BP 128/70 (BP Location: Left Arm, Patient Position: Sitting, Cuff Size: Large)   Pulse 65   Resp 18   SpO2 98%  Review of Systems  Constitutional: Negative.   HENT: Negative.        Hoarse voice   Eyes: Negative.   Respiratory: Positive for cough. Negative for shortness of breath and wheezing.   Cardiovascular: Negative.   Gastrointestinal: Negative.   Genitourinary:       Foley, clear yellow urine  Musculoskeletal: Negative.   Skin: Negative.   Neurological: Negative for dizziness.  Endo/Heme/Allergies: Negative.   Psychiatric/Behavioral: Negative.  Negative for suicidal ideas.    Physical Exam  Constitutional: He is oriented to person, place, and time. He appears well-developed and well-nourished.  Cardiovascular: Normal rate, normal heart sounds and intact distal pulses.  Respiratory: Effort normal and breath sounds normal.  GI: Soft. Bowel sounds are normal.  Neurological: He is alert and oriented to person, place, and time.  Skin: Skin is warm and dry.     Psychiatric: He has a normal mood and affect. His behavior is normal. Judgment and thought content normal.    Encounter Medications:   Outpatient Encounter Medications as of 08/25/2017  Medication Sig Note  . aspirin EC 81 MG EC tablet Take 1 tablet (81 mg total) by mouth daily.   Marland Kitchen atenolol (TENORMIN) 50 MG tablet Take 1 tablet (50 mg total) by mouth daily.   Marland Kitchen atorvastatin (LIPITOR) 40 MG tablet Take 1 tablet (40 mg total) by mouth daily at 6 PM. (Patient taking differently: Take 40 mg by mouth daily. )   . Blood Glucose Monitoring Suppl ([place]) DEVI    . clopidogrel (PLAVIX) 75 MG tablet Take 1  tablet (75 mg total) by mouth daily.   Marland Kitchen glucose blood (RELION PRIME TEST) test strip    . insulin NPH Human (HUMULIN N,NOVOLIN N) 100 UNIT/ML injection Inject 0.25 mLs (25 Units total) into the skin 2 (two) times daily.   . insulin regular (NOVOLIN R,HUMULIN R) 100 units/mL injection Inject 10-15 Units into the skin 2 (two) times daily. TAKES 15 UNITS IN THE MORNING AND 10 UNITS WITH SUPPER 06/26/2017: 15 units in am, 10 units suppertime  . levothyroxine (SYNTHROID, LEVOTHROID) 300 MCG tablet Take 300 mcg by mouth daily before breakfast.   . simvastatin (ZOCOR) 20 MG tablet 20 mg daily.    . sodium bicarbonate 650 MG tablet Take 1 tablet by mouth 2 (two) times daily.   . tamsulosin (FLOMAX) 0.4 MG CAPS capsule Take 1 capsule (0.4 mg total) by mouth daily after breakfast.   . [START ON 08/31/2017] cephALEXin (KEFLEX) 500 MG capsule Take 1 capsule (500 mg total) 3 (three) times daily by mouth. (Patient not taking: Reported on 08/25/2017)   . docusate sodium (COLACE) 100 MG capsule Take 1 capsule (100 mg total) by mouth 2 (two) times daily. (Patient not taking: Reported on 08/25/2017)   . oxyCODONE-acetaminophen (PERCOCET) 7.5-325 MG tablet    . polyethylene glycol (MIRALAX / GLYCOLAX) packet Take 17 g by mouth daily as needed for mild constipation. (Patient not taking: Reported on 08/25/2017)    No facility-administered encounter medications on file as of 08/25/2017.  Functional Status:   In your present state of health, do you have any difficulty performing the following activities: 08/25/2017 08/11/2017  Hearing? N -  Vision? N -  Difficulty concentrating or making decisions? N -  Walking or climbing stairs? Y -  Comment using walker  -  Dressing or bathing? Y -  Comment wife helps  -  Doing errands, shopping? Y N  Comment family helps  -  Conservation officer, nature and eating ? Y -  Comment wife helps with meal prep -  Using the Toilet? N -  In the past six months, have you accidently leaked  urine? N -  Do you have problems with loss of bowel control? N -  Managing your Medications? Y -  Comment family helps -  Managing your Finances? N -  Housekeeping or managing your Housekeeping? Y -  Comment wife helps  -  Some recent data might be hidden    Fall/Depression Screening:    Fall Risk  08/20/2017 07/31/2017 06/20/2017  Falls in the past year? Yes Yes No  Number falls in past yr: 1 1 -  Injury with Fall? Yes Yes -  Risk Factor Category  High Fall Risk High Fall Risk -  Risk for fall due to : History of fall(s);Impaired balance/gait History of fall(s);Impaired balance/gait;Impaired mobility Impaired balance/gait  Follow up Falls prevention discussed Falls evaluation completed;Falls prevention discussed;Education provided -   PHQ 2/9 Scores 07/31/2017 06/20/2017 07/22/2016 03/19/2016 03/16/2015  PHQ - 2 Score 0 0 0 0 0    Assessment:    Recent UTI,retention,Right Renal Mass Foley has in place, unable to void after trial at urology appointment. Foley urine clear, keeping foley bag in good location when sitting and routine care reviewed with teach back. . Has upcoming ureteral biopsy on 11/21, per patient he has notified urology that he remains on Plavix, patient discussed plan to stop medication prior to procedure anticipates call from office on Friday.  S/P Kyphoplasty Tolerating ambulation in home, using rolling walker for outpatient PT appointment on 11/12. Home safety evaluation , patient with increased safety equipment in placed, raised commode seat with rails, bedside commode, safety bar in bathroom.MD follow up in the next week, surgical site without increased redness. Medications Noted during review of medications, patients' wife Losartan pill bottle was in container with patient medications bottles  reviewed pill organizer and Losartan identified in Environmental education officer. I reviewed all medication in pill organizer everything else was correct, losartan removed  Patient and wife reports  family has filled pill organizer for at least the last 8 days.. Patient denies dizziness or any new symptoms .  Patient was recently discharged from hospital and all medications have been reviewed. Recent Carotid Endarterectomy Incision well healed, patient continues with low voice and hoarseness. ENT referral appointment has been scheduled. Patient's appetite has improved, reports eating 3 meals a day.  Diabetes Checking blood sugar at least 3 times daily, not keeping written record,today's reading 202 taking sliding scale as prescribed. Reviewed meter 14 day average is 180 and 30 day average 200. No hypoglycemia symptoms .   Patient and wife discussed difficulty at times with transportation to appointments, family has rearranged work schedules to take to next therapy appointment on 11/13, and hopeful they will be rearrange schedule for PCP visit on 11/25. Patient will benefit from [facility] LCSW consult for transportation concerns , patient being to drive at this time, and unsure if family will be able to rearrange work  schedules.  Patient  does not have completed Advanced directive, provided handout and reviewed how to complete, patient states understands and plans to complete and states will get notarized at their bank  Patient interested in being able to monitor blood pressure at home, does not have a monitor not able to afford at this time.   Plan Will continue weekly transition of care outreaches, next outreach within a week.  Placed call to [place] office able to leave a message on nurse Chassidy line to report Losartan in patient pill organizer. Will place LCSW consult for transportation resources. Will review [facility] resources for obtaining blood pressure monitor and provide education .    THN CM Care Plan Problem One     Most Recent Value  Care Plan Problem One  Patient with recent hospital admission related to Acute Kidney injury, Pneumonia, UTI and urine retention   Role Documenting  the Problem One  Care Management Coordinator  Care Plan for Problem One  Active  THN Long Term Goal   Patient will not experience a hospital admission in the next 31 days   THN Long Term Goal Start Date  08/20/17 Barrie Folk restarted due to readmission ]  Interventions for Problem One Long Term Goal  Reviewed with patient/wife using most recent AVS for filling pill organizer , notify RN , MD if concern related to medication,. Reviewed use of 24 hour nurse line for medical questions after 5 pm.   [facility]N CM Short Term Goal #1   Patient will attend all medical appointments in the next 30 days   THN CM Short Term Goal #1 Start Date  08/20/17  Interventions for Short Term Goal #1  Discussed all upcoming medical appointments, reviewed transporation , will place LCSW for transportation concerns   THN CM Short Term Goal #2   Patient will not experince a fall in the next 30 days   THN CM Short Term Goal #2 Start Date  08/20/17  Interventions for Short Term Goal #2  Reinforced fall prevention measures, using walker at all times and attending Physical therapy appointments   [facility] CM Short Term Goal #3  Patient will report increased improvement in walking and activity tolerance as evidenced by report in the next 30 days   THN CM Short Term Goal #3 Start Date  08/25/17  Interventions for Short Tern Goal #3  Adviced regarding the benefits of physical therapy to increase strength and improve mobility       [name], RN, [place] Management Coordinator  [phone]- Mobile [phone]- [place]

## 2017-08-25 NOTE — Progress Notes (Signed)
Notified pt's wife, per Dr Pilar Jarvis, script for Keflex 500mg  tid x 5 days was sent to pharmacy. Pt is to begin prescription 3 days prior to surgery (begin on 08/31/17). Questions answered. Wife voices understanding.

## 2017-08-26 ENCOUNTER — Other Ambulatory Visit: Payer: Self-pay

## 2017-08-26 ENCOUNTER — Ambulatory Visit: Payer: PPO | Admitting: Occupational Therapy

## 2017-08-26 ENCOUNTER — Other Ambulatory Visit: Payer: Self-pay | Admitting: *Deleted

## 2017-08-26 ENCOUNTER — Other Ambulatory Visit: Payer: Self-pay | Admitting: Radiology

## 2017-08-26 ENCOUNTER — Ambulatory Visit: Payer: PPO | Attending: Orthopedic Surgery | Admitting: Physical Therapy

## 2017-08-26 ENCOUNTER — Encounter: Payer: Self-pay | Admitting: *Deleted

## 2017-08-26 ENCOUNTER — Encounter: Payer: Self-pay | Admitting: Physical Therapy

## 2017-08-26 VITALS — BP 118/89 | HR 79

## 2017-08-26 DIAGNOSIS — M6281 Muscle weakness (generalized): Secondary | ICD-10-CM

## 2017-08-26 DIAGNOSIS — R2689 Other abnormalities of gait and mobility: Secondary | ICD-10-CM | POA: Diagnosis not present

## 2017-08-26 DIAGNOSIS — R2681 Unsteadiness on feet: Secondary | ICD-10-CM | POA: Diagnosis not present

## 2017-08-26 DIAGNOSIS — D49519 Neoplasm of unspecified behavior of unspecified kidney: Secondary | ICD-10-CM

## 2017-08-26 NOTE — Patient Outreach (Signed)
[facility]) Care Management  08/26/2017  AYDIEN MAJETTE 1941-03-10 [id number]    Phone call to patient and patient's spouse to discuss transportation resources. Per patient and spouse, they have several upcoming appointments and their family has sacrificed their work schedule to get patient there, however they would like a back up plan. Phone number provided to patient for [facility] [phone] and recommended that she call today to schedule her rides to these appointment.  Patient's spouse agreed to contact this [facility] transportation to schedule rides to his follow up appointments.   Plan: This social worker will follow up with patient and his spouse on 08/27/17 regarding transporation.    Sheralyn Boatman [facility] Care Management [phone]

## 2017-08-27 ENCOUNTER — Encounter: Payer: Self-pay | Admitting: *Deleted

## 2017-08-27 ENCOUNTER — Other Ambulatory Visit: Payer: Self-pay | Admitting: *Deleted

## 2017-08-27 NOTE — Therapy (Signed)
[place] MAIN [facility] SERVICES [address] [place], [place], [address] Phone: [phone]   Fax:  [phone]  Physical Therapy Evaluation  Patient Details  Name: [name] MRN: [id number] Date of Birth: Aug 19, 1941 Referring Provider: Dorise Hiss, PA-C   Encounter Date: 08/26/2017  PT End of Session - 08/26/17 1634    Visit Number  1    Number of Visits  13    Date for PT Re-Evaluation  10/07/17    Authorization Type  G codes    Authorization Time Period  1/10    PT Start Time  1341    PT Stop Time  1448    PT Time Calculation (min)  67 min    Equipment Utilized During Treatment  Gait belt    Activity Tolerance  Patient tolerated treatment well    Behavior During Therapy  [facility] for tasks assessed/performed       Past Medical History:  Diagnosis Date  . Blood transfusion without reported diagnosis   . Cancer [facility])    Colon  . Coronary artery disease   . Diabetes mellitus without complication ([place])   . Dysrhythmia   . Hyperlipidemia   . Hypertension   . Myocardial infarction ([place]) 1997  . Peripheral vascular disease ([place])   . Pneumonia 2014  . Stroke ([place]) 05/2017  . Thyroid disease     Past Surgical History:  Procedure Laterality Date  . APPENDECTOMY  2011  . BACK SURGERY    . CARDIAC CATHETERIZATION    . COLON SURGERY  2011   Colectomy for ileo-cecal valve cancer  . CORONARY ARTERY BYPASS GRAFT  1997   x 3  . HERNIA REPAIR  2011   Ventral hernia  . KNEE SURGERY Left   . PROSTATE SURGERY  2002   BPH benign pathology  . SPINE SURGERY  1989   Lumbar disc    Vitals:   08/26/17 1346  BP: 118/89  Pulse: 79  SpO2: 99%     Subjective Assessment - 08/26/17 1348    Subjective  Gait abnormality with use RW, BLE weakness, and impaired balance following kyphoplasty    Patient is accompained by:  Family member wife    Pertinent History  Pt is a [age] y/o Ruiz who presents s/p T12 Kyphoplasty on 11/1 after fall at home  with resultant compression fx. He originally presented to the hospital due to CAP and UTI.  Pt seen by PT during hospital stay and was supervision for bed mobility and CGA for transfers/ambulation with RW. Pt able to ambulate 200 ft with RW at that time. Pt was independent prior to fall and surgery. Pt is not wearing a back brace. Restrictions not to lift more than 5 lbs.Pt presented today with urinary catheter which pt reports was placed at the start of his hospital stay and has remained in since. Pt reports he has a "spot on my kidney and am having surgery to investigate and take it off with a laser".  Pt additionally with recent CVA in September 2018.  Pt reports CVA was minor with no residual deficits. Pt reports that on 10/4 "they cleared out my R Carotid".  Pt presents with hoarseness which pt was told was a result of this procedure. Pt reports he had 100% blockage in the L carotid and 76% blockage in the R carotid before this procedure.  Pt reports he has noticed some imbalance. Pt ambulates with a RW now and that he has  not left his home since surgery since neither the pt nor the wife are able to drive (pt instructed not to drive following kyphoplasty).  Wife currently assisting with sponge bathing and with dressing due to catheter.  Denies any numbness or tingling BUE or BLE.  Pt has been ambulating up to 15-20 during the day, walking laps inside his home.  Pt is on a stool softener since back surgery and denies any recent changes in bowel. Denies experiencing night sweats, no nausea/vomiting. Starting last summer  pt began losing weight and has lost ~20-25 lbs since.    Limitations  Lifting;Standing;Walking;House hold activities;Other (comment) dressing, bathing    How long can you sit comfortably?  unlimited    How long can you stand comfortably?  3 minutes    How long can you walk comfortably?  5 minutes    Patient Stated Goals  Improve balance, strength, endurance    Currently in Pain?  No/denies     Multiple Pain Sites  No         [facility] PT Assessment - 08/26/17 1353      Assessment   Medical Diagnosis  s/p Kyphoplasty    Referring Provider  Dorise Hiss, PA-C    Onset Date/Surgical Date  08/14/17    Hand Dominance  Left    Next MD Visit  11/15 with PCP (Dr. Army Melia)    Prior Therapy  Yes      Precautions   Precautions  Fall;Other (comment)    Precaution Comments  has urinary catheter      Restrictions   Weight Bearing Restrictions  No      Balance Screen   Has the patient fallen in the past 6 months  Yes    How many times?  2    Has the patient had a decrease in activity level because of a fear of falling?   Yes    Is the patient reluctant to leave their home because of a fear of falling?   No      Home Environment   Living Environment  Private residence    Living Arrangements  Spouse/significant other    Available Help at Discharge  Family;Available 24 hours/day    Type of Home  Mobile home    Home Access  Stairs to enter    Entrance Stairs-Number of Steps  3    Entrance Stairs-Rails  None Grandson is planning to install [place]  One level    [place] seat;Grab bars - tub/shower;Walker - 2 wheels;Cane - single point;Bedside commode;Hand held shower head      Prior Function   Level of Independence  Needs assistance with homemaking;Needs assistance with ADLs;Independent with household mobility with device    Vocation  Retired      Associate Professor   Overall Cognitive Status  Within Functional Limits for tasks assessed      ROM / Strength   AROM / PROM / Strength  Strength      Strength   Overall Strength  Deficits    Strength Assessment Site  Shoulder;Elbow;Hip;Knee;Ankle    Right/Left Shoulder  Right;Left    Right Shoulder Flexion  5/5    Right Shoulder ABduction  5/5    Left Shoulder Flexion  4/5    Left Shoulder ABduction  5/5    Right/Left Elbow  Right;Left    Right Elbow Flexion  5/5    Right Elbow Extension  5/5  Left Elbow Flexion  5/5    Left Elbow Extension  5/5    Right/Left Hip  Right;Left    Right Hip Flexion  3/5    Right Hip External Rotation   5/5    Right Hip Internal Rotation  5/5    Right Hip ABduction  4/5    Right Hip ADduction  5/5    Left Hip Flexion  3/5    Left Hip External Rotation  5/5    Left Hip Internal Rotation  5/5    Left Hip ABduction  4/5    Left Hip ADduction  5/5    Right/Left Knee  Left;Right    Right Knee Flexion  4-/5    Right Knee Extension  5/5    Left Knee Flexion  4-/5    Left Knee Extension  5/5    Right/Left Ankle  Right;Left      Balance   Balance Assessed  Yes      Standardized Balance Assessment   Standardized Balance Assessment  Berg Balance Test      Berg Balance Test   Sit to Stand  Able to stand without using hands and stabilize independently    Standing Unsupported  Able to stand 2 minutes with supervision    Sitting with Back Unsupported but Feet Supported on Floor or Stool  Able to sit safely and securely 2 minutes    Stand to Sit  Sits safely with minimal use of hands    Transfers  Able to transfer safely, minor use of hands    Standing Unsupported with Eyes Closed  Able to stand 10 seconds with supervision    Standing Ubsupported with Feet Together  Able to place feet together independently and stand for 1 minute with supervision       EXAMINATION   Outcome measures were completed and results explained to the patient:   5xSTS: 18.30 seconds, after two sets the pt pushes with L hand to assist in boosting to standing. Pt braces posterior BLEs against side of mat table for stability.   39mWT: 0.61 Ruiz/s using RW   Berg Balance Test: Only able to complete half of test as pt requires seated rest break due to fatigue. While sitting the pt reported pressure in posterior neck and mild lightheadedness that he has not experienced before. No additional symptoms. Vitals taken in sitting: BP 111/93 taken in RUE, Pulse 81, SpO2 100. Symptoms  completely resolved quickly. Pt rested for 5 minutes and BP taken again reading 98/50 in LUE, retaken in RUE 118/90. Pt reports that he was accidentally taking his wife's pills for 1wk up until today before realizing his mistake. Wife is on a BP medication but pt and wife are unsure of the name.   Palpation: No TTP or increased musculature tension noted in lower thoracic or upper lumbar region   Sensation: WNL BUEs and BLEs with dermatomal testing   Reflexes: Pt unable to relax despite use of Jendrassik maneuver   Coordination: WNL BLEs, slightly impaired fine motor BUEs   Posture: Forward head posture, moderate>severe thoracic kyphosis, decreased lumbar lordosis   Gait Analysis: Flexed posture with wide BOS, dec Bil hip E, dec Bil hip flexion       Objective measurements completed on examination: See above findings.    TREATMENT   Bed mobility: log roll technique. Pt demonstrated proper technique x2 with demonstration from this PT prior. With cues on first attempt, without cues the second attempt. (added to HEP)  Sit<>stand x10 with light UE support to push up into standing. Cues for upright posture on in standing (added to HEP)      PT Education - 08/26/17 1421    Education provided  Yes    Education Details  POC, exercise technique, role of PT, HEP    Person(s) Educated  Patient;Spouse    Methods  Explanation;Demonstration;Handout;Verbal cues    Comprehension  Verbalized understanding;Returned demonstration;Verbal cues required;Need further instruction       PT Short Term Goals - 08/27/17 1556      PT SHORT TERM GOAL #1   Title  Pt will be independent with HEP for carryover between sessions    Time  2    Period  Weeks    Status  New        PT Long Term Goals - 08/27/17 1556      PT LONG TERM GOAL #1   Title  Pt will improve 5xSTS time to at least 13 seconds to demonstrate improved BLE strength and balance    Baseline  18.30 seconds and pt utilizes UE to  push up to standing    Time  4    Period  Weeks    Status  New      PT LONG TERM GOAL #2   Title  Pt will improve 33mWT to at least 1.0 Ruiz/s with LRAD to demonstrate improved gait speed    Baseline  0.61 Ruiz/s with RW    Time  4    Period  Weeks    Status  New      PT LONG TERM GOAL #3   Title  Pt will improve BLE MMT to at least 4+/5 to demonstrate improved strength    Baseline  See Evaluation    Time  6    Period  Weeks    Status  New             Plan - 08/27/17 1601    Clinical Impression Statement  Pt is a 76 y/o Ruiz who presents with BLE weakness and instability following recent CVA and kyphoplasty.  He ambulates with a RW but would like to transition to cane or no AD.  He demonstrates a decreased gait speed using RW and will benefit from BLE strength as evidenced by his MMT scores and 5xSTS time.  Unable to complete Berg Balance Test in it's entirety today due to symptoms of pressure in posterior neck and lightheadedness halfway throughout the test (see notes below for more detail).  Pt will benefit from skilled PT interventions for improved strength, balance, gait mechanics and endurance, for improved QOL.     History and Personal Factors relevant to plan of care:  (+) Pt very motivated to return to PLOF, has a very supportive wife  (-) significant PMH    Clinical Presentation  Stable    Clinical Presentation due to:  Pt's presentation is stable with no new developments    Clinical Decision Making  Low    Rehab Potential  Good    PT Frequency  2x / week    PT Duration  6 weeks    PT Treatment/Interventions  ADLs/Self Care Home Management;Aquatic Therapy;Cryotherapy;Electrical Stimulation;Iontophoresis 4mg /ml Dexamethasone;Moist Heat;Ultrasound;Traction;Contrast Bath;DME Instruction;Gait training;Stair training;Functional mobility training;Therapeutic activities;Therapeutic exercise;Balance training;Neuromuscular re-education;Patient/family education;Manual techniques;Compression  bandaging;Passive range of motion;Dry needling;Energy conservation;Taping    PT Next Visit Plan  Complete Berg, review HEP and progress strengthening and balance, initiate gait training    PT Home Exercise Plan  sit<>stand, log roll     Recommended Other Services  None at this time    Consulted and Agree with Plan of Care  Patient;Family member/caregiver    Family Member Consulted  Wife       Patient will benefit from skilled therapeutic intervention in order to improve the following deficits and impairments:  Abnormal gait, Decreased activity tolerance, Decreased balance, Decreased endurance, Decreased knowledge of use of DME, Decreased knowledge of precautions, Decreased mobility, Decreased safety awareness, Decreased strength, Difficulty walking, Impaired perceived functional ability, Improper body mechanics, Postural dysfunction  Visit Diagnosis: Unsteadiness on feet  Other abnormalities of gait and mobility  Muscle weakness (generalized)  G-Codes - 09-05-2017 1637    Functional Assessment Tool Used (Outpatient Only)  5xSTS, 23mWT, MMT, clinical judgement    Functional Limitation  Mobility: Walking and moving around    Mobility: Walking and Moving Around Current Status [phone])  At least 20 percent but less than 40 percent impaired, limited or restricted    Mobility: Walking and Moving Around Goal Status [id number])  At least 1 percent but less than 20 percent impaired, limited or restricted        Problem List Patient Active Problem List   Diagnosis Date Noted  . CAP (community acquired pneumonia) 08/11/2017  . Bilateral carotid artery stenosis 08/01/2017  . Hoarseness of voice 08/01/2017  . Compression fracture of body of thoracic vertebra ([place]) 07/28/2017  . Carotid stenosis, symptomatic, with infarction ([place]) 07/17/2017  . Cerebrovascular accident (CVA) ([place]) 06/11/2017  . Chronic shoulder bursitis, left 07/22/2016  . Cholecystitis, acute 04/21/2016  . 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 CKD stage 4 due to type 2 diabetes mellitus ([place]) 03/16/2015  . Diabetes mellitus with polyneuropathy ([place]) 03/16/2015  . CAD in native artery 01/20/2015  . Gout 01/20/2015  . Diabetic peripheral neuropathy ([place]) 01/20/2015  . H/O malignant neoplasm of colon 01/20/2015  . Adult hypothyroidism 01/20/2015  . Hyperlipidemia associated with type 2 diabetes mellitus ([place]) 01/20/2015  . Encounter for long-term (current) use of insulin ([place]) 07/27/2014  . High potassium 05/19/2014  . 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 Hypertension in stage 4 chronic kidney disease due to type 2 diabetes mellitus ([place]) 07/15/2013    Collie Siad PT, DPT 08/27/2017, 4:11 PM  [place] MAIN [facility] SERVICES [address] [place], [place], [address] Phone: [phone]   Fax:  [phone]  Name: JARIUS DIEUDONNE MRN: [id number] Date of Birth: 1941/04/25

## 2017-08-27 NOTE — Telephone Encounter (Signed)
This encounter was created in error - please disregard.

## 2017-08-27 NOTE — Patient Outreach (Signed)
[facility]) Care Management  08/27/2017  [name] [birth year] [id number]   Phone call to patient and patient's spouse, to follow up on trnsportation needs. Per patient's spouse, she contacted the [facility] transportation but informed them that she had Medicaid which prompted the need for an additional assessment. This Education officer, museum confirmed that patient does not have Medicaid and that an assessment is not needed. Patient has an appointment in 09/01/17 with Dr. Sherrie Sport to arrange due to date being outside of the notification guidelines for [facility]. Patient's spouse to arrange the next scheduled doctor's appointment  scheduled for 09/09/17 with Dr. Lucky Cowboy.   Sheralyn Boatman [facility] Care Management [phone]

## 2017-08-28 ENCOUNTER — Ambulatory Visit: Payer: Self-pay | Admitting: *Deleted

## 2017-08-28 ENCOUNTER — Other Ambulatory Visit: Payer: Self-pay | Admitting: *Deleted

## 2017-08-28 ENCOUNTER — Encounter: Payer: Self-pay | Admitting: Internal Medicine

## 2017-08-28 ENCOUNTER — Ambulatory Visit: Payer: PPO | Admitting: Internal Medicine

## 2017-08-28 VITALS — BP 128/68 | HR 84 | Ht 74.0 in | Wt 204.0 lb

## 2017-08-28 DIAGNOSIS — N184 Chronic kidney disease, stage 4 (severe): Secondary | ICD-10-CM

## 2017-08-28 DIAGNOSIS — J189 Pneumonia, unspecified organism: Secondary | ICD-10-CM

## 2017-08-28 DIAGNOSIS — E1122 Type 2 diabetes mellitus with diabetic chronic kidney disease: Secondary | ICD-10-CM | POA: Diagnosis not present

## 2017-08-28 DIAGNOSIS — E785 Hyperlipidemia, unspecified: Secondary | ICD-10-CM

## 2017-08-28 DIAGNOSIS — R490 Dysphonia: Secondary | ICD-10-CM

## 2017-08-28 DIAGNOSIS — E1169 Type 2 diabetes mellitus with other specified complication: Secondary | ICD-10-CM | POA: Diagnosis not present

## 2017-08-28 DIAGNOSIS — R339 Retention of urine, unspecified: Secondary | ICD-10-CM

## 2017-08-28 DIAGNOSIS — I63239 Cerebral infarction due to unspecified occlusion or stenosis of unspecified carotid arteries: Secondary | ICD-10-CM

## 2017-08-28 DIAGNOSIS — I129 Hypertensive chronic kidney disease with stage 1 through stage 4 chronic kidney disease, or unspecified chronic kidney disease: Secondary | ICD-10-CM

## 2017-08-28 MED ORDER — ATORVASTATIN CALCIUM 40 MG PO TABS: 40.0000 mg | ORAL_TABLET | Freq: Every day | ORAL | 5 refills | Status: DC

## 2017-08-28 NOTE — Progress Notes (Signed)
Date:  08/28/2017   Name:  [name]   DOB:  23-Apr-1941   MRN:  [id number]   Chief Complaint: Hospitalization Follow-up (Doing better than was. ) Admitted to [facility] with aspiration pneumonia after CEA.  Admitted 08/10/17 to 08/15/17.  He was treated with IV antibiotics the changed over to Augmentin until 08/19/17. He completed the oral course with no side effects.  He feels that his breathing is improving.    He had urinary retention after the hospital stay so has a foley catheter.  He is seeing Urology next week for a cystoscope to evaluate a urethral lesion.    He also has chronic hoarseness since CEA. Seeing ENT at the end of the month. Symptoms are slowly improving.  He has a chronic cough to clear the phlegm.    Review of Systems  Constitutional: Negative for diaphoresis, fatigue and unexpected weight change.  HENT: Positive for voice change. Negative for trouble swallowing.   Respiratory: Positive for cough. Negative for chest tightness, shortness of breath and wheezing.   Cardiovascular: Negative for chest pain and palpitations.  Genitourinary: Positive for difficulty urinating.  Neurological: Negative for dizziness and headaches.    Patient Active Problem List   Diagnosis Date Noted  . CAP (community acquired pneumonia) 08/11/2017  . Bilateral carotid artery stenosis 08/01/2017  . Hoarseness of voice 08/01/2017  . Compression fracture of body of thoracic vertebra ([place]) 07/28/2017  . Carotid stenosis, symptomatic, with infarction ([place]) 07/17/2017  . Cerebrovascular accident (CVA) ([place]) 06/11/2017  . Chronic shoulder bursitis, left 07/22/2016  . Cholecystitis, acute 04/21/2016  . Uncontrolled type 2 diabetes mellitus with stage 4 chronic kidney disease, with long-term current use of insulin ([place]) 09/14/2015  . 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 CKD stage 4 due to type 2 diabetes mellitus ([place]) 03/16/2015  . Diabetes mellitus with polyneuropathy ([place]) 03/16/2015    . CAD in native artery 01/20/2015  . Gout 01/20/2015  . Diabetic peripheral neuropathy ([place]) 01/20/2015  . H/O malignant neoplasm of colon 01/20/2015  . Adult hypothyroidism 01/20/2015  . Hyperlipidemia associated with type 2 diabetes mellitus ([place]) 01/20/2015  . Encounter for long-term (current) use of insulin ([place]) 07/27/2014  . High potassium 05/19/2014  . 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 Hypertension in stage 4 chronic kidney disease due to type 2 diabetes mellitus ([place]) 07/15/2013    Prior to Admission medications   Medication Sig Start Date End Date Taking? Authorizing Provider  acetaminophen (TYLENOL) 500 MG tablet Take 1,000 mg every 6 (six) hours as needed by mouth for moderate pain or headache.   Yes [provider]  aspirin EC 81 MG EC tablet Take 1 tablet (81 mg total) by mouth daily. 07/19/17  Yes Dew, Erskine Squibb, MD  atenolol (TENORMIN) 50 MG tablet Take 1 tablet (50 mg total) by mouth daily. 08/08/17  Yes Glean Hess, MD  atorvastatin (LIPITOR) 40 MG tablet Take 1 tablet (40 mg total) by mouth daily at 6 PM. 06/14/17  Yes Vaughan Basta, MD  cephALEXin (KEFLEX) 500 MG capsule Take 1 capsule (500 mg total) 3 (three) times daily by mouth. 08/31/17  Yes Nickie Retort, MD  docusate sodium (COLACE) 100 MG capsule Take 1 capsule (100 mg total) by mouth 2 (two) times daily. 08/15/17  Yes Vaughan Basta, MD  insulin NPH Human (HUMULIN N,NOVOLIN N) 100 UNIT/ML injection Inject 0.25 mLs (25 Units total) into the skin 2 (two) times daily. 08/15/17  Yes Vaughan Basta, MD  insulin regular (NOVOLIN R,HUMULIN R) 100 units/mL injection Inject 10-15 Units See admin instructions into the skin. Inject 15 units SQ in the morning and inject 10 units SQ with supper   Yes [provider]  levothyroxine (SYNTHROID, LEVOTHROID) 300 MCG tablet Take 300 mcg by mouth daily before breakfast.   Yes [provider]  polyethylene glycol (MIRALAX / GLYCOLAX) packet Take 17 g by mouth daily as needed for mild constipation. 08/15/17  Yes Vaughan Basta, MD  simvastatin (ZOCOR) 20 MG tablet Take 20 mg daily by mouth.  07/14/17  Yes [provider]  sodium bicarbonate 650 MG tablet Take 650 mg 2 (two) times daily by mouth.  04/28/17 04/28/18 Yes [provider]  tamsulosin (FLOMAX) 0.4 MG CAPS capsule Take 1 capsule (0.4 mg total) by mouth daily after breakfast. 08/16/17  Yes Vaughan Basta, MD  clopidogrel (PLAVIX) 75 MG tablet Take 1 tablet (75 mg total) by mouth daily. Patient not taking: Reported on 08/28/2017 08/08/17   Glean Hess, MD    Allergies  Allergen Reactions  . Ace Inhibitors Other (See Comments)    Reaction:  Raises potassium   . Quinapril Rash and Other (See Comments)    hyperkalemia    Past Surgical History:  Procedure Laterality Date  . APPENDECTOMY  2011  . BACK SURGERY    . CARDIAC CATHETERIZATION    . CAROTID ANGIOGRAPHY Right 06/13/2017   Procedure: Right subclavian and Carotid Angiography, possible intervention;  Surgeon: Algernon Huxley, MD;  Location: [facility];  Service: Cardiovascular;  Laterality: Right;  . CHOLECYSTECTOMY N/A 04/23/2016   Procedure: LAPAROSCOPIC CHOLECYSTECTOMY;  Surgeon: [name], MD;  Location: [facility] ORS;  Service: General;  Laterality: N/A;  . COLON SURGERY  2011   Colectomy for ileo-cecal valve cancer  . CORONARY ARTERY BYPASS GRAFT  1997   x 3  . ENDARTERECTOMY Right 07/17/2017   Procedure: ENDARTERECTOMY CAROTID;  Surgeon: Algernon Huxley, MD;  Location: [facility] ORS;  Service: Vascular;  Laterality: Right;  . HERNIA REPAIR  2011   Ventral hernia  . KNEE SURGERY Left   . KYPHOPLASTY N/A 08/14/2017   Procedure: [id number];  Surgeon: Hessie Knows, MD;  Location: [facility] ORS;  Service: Orthopedics;  Laterality: N/A;  . PROSTATE SURGERY  2002   BPH benign pathology  . SPINE SURGERY  1989    Lumbar disc    Social History   Tobacco Use  . Smoking status: Former Smoker    Types: Cigarettes    Last attempt to quit: 06/24/1996    Years since quitting: 21.1  . Smokeless tobacco: Never Used  Substance Use Topics  . Alcohol use: No    Alcohol/week: 0.0 oz  . Drug use: No     Medication list has been reviewed and updated.  PHQ 2/9 Scores 07/31/2017 06/20/2017 07/22/2016 03/19/2016  PHQ - 2 Score 0 0 0 0    Physical Exam  Constitutional: He is oriented to person, place, and time. He appears well-developed. No distress.  HENT:  Head: Normocephalic and atraumatic.  Cardiovascular: Normal rate, regular rhythm and normal heart sounds.  Pulmonary/Chest: Effort normal and breath sounds normal. No respiratory distress. He has no wheezes.  Abdominal: Soft. Normal appearance.  Musculoskeletal: Normal range of motion.  Neurological: He is alert and oriented to person, place, and time. No cranial nerve deficit or sensory deficit. Gait (walks with walker) abnormal.  Voice hoarse  Skin: Skin is warm and  dry. No rash noted.  Psychiatric: He has a normal mood and affect. His behavior is normal. Thought content normal.  Nursing note and vitals reviewed.   BP 128/68   Pulse 84   Ht 6\' 2"  (1.88 m)   Wt 204 lb (92.5 kg)   SpO2 98%   BMI 26.19 kg/m   Assessment and Plan: 1. Community acquired pneumonia, unspecified laterality resolving  2. Carotid stenosis, symptomatic, with infarction [facility]) Need to resume [place] at Urology instructions Continue PT  3. Hypertension in stage 4 chronic kidney disease due to type 2 diabetes mellitus ([place]) controlled  4. Hoarseness of voice Seeing ENT in the near future  5. Urinary retention Has foley - procedure next week  6. Hyperlipidemia associated with type 2 diabetes mellitus (H[facility]) - atorvastatin (LIPITOR) 40 MG tablet; Take 1 tablet (40 mg total) daily at 6 PM by mouth.  Dispense: 30 tablet; Refill: 5   Meds ordered this encounter    Medications  . DISCONTD: atorvastatin (LIPITOR) 40 MG tablet    Sig: Take 1 tablet (40 mg total) daily at 6 PM by mouth.    Dispense:  30 tablet    Refill:  5  . atorvastatin (LIPITOR) 40 MG tablet    Sig: Take 1 tablet (40 mg total) daily at 6 PM by mouth.    Dispense:  30 tablet    Refill:  5    Partially dictated using Editor, commissioning. Any errors are unintentional.  Halina Maidens, MD [facility]  08/28/2017

## 2017-08-28 NOTE — Patient Outreach (Signed)
[facility]) Care Management  08/28/2017  [name] 1940/12/08 [id number]  Late Entry note  for 11/14 Telephone follow up  Placed call to patient, spoke his wife , she discussed upcoming PCP visit on tomorrow, will reschedule RNCM scheduled visit on same day. Wife discussed they have received call from Urology office reporting to stop Plavix in preparation for procedure on next week. Patient has removed medication from pill organizer.  11/15 Patient has case has been reviewed in multidisciplinary case discussion .   Plan Will reschedule next transition of care contact for the next week.    [name], RN, [place] Management Coordinator  [phone]- Mobile [phone]- Toll Free Main Office

## 2017-08-29 ENCOUNTER — Encounter
Admission: RE | Admit: 2017-08-29 | Discharge: 2017-08-29 | Disposition: A | Discharge status: Home / Self Care | Payer: PPO | Source: Ambulatory Visit | Attending: Urology | Admitting: Urology

## 2017-08-29 ENCOUNTER — Other Ambulatory Visit: Payer: Self-pay

## 2017-08-29 DIAGNOSIS — E1169 Type 2 diabetes mellitus with other specified complication: Secondary | ICD-10-CM | POA: Diagnosis not present

## 2017-08-29 DIAGNOSIS — N184 Chronic kidney disease, stage 4 (severe): Secondary | ICD-10-CM | POA: Diagnosis not present

## 2017-08-29 DIAGNOSIS — M109 Gout, unspecified: Secondary | ICD-10-CM | POA: Insufficient documentation

## 2017-08-29 DIAGNOSIS — R339 Retention of urine, unspecified: Secondary | ICD-10-CM | POA: Insufficient documentation

## 2017-08-29 DIAGNOSIS — I2510 Atherosclerotic heart disease of native coronary artery without angina pectoris: Secondary | ICD-10-CM | POA: Insufficient documentation

## 2017-08-29 DIAGNOSIS — E039 Hypothyroidism, unspecified: Secondary | ICD-10-CM | POA: Diagnosis not present

## 2017-08-29 DIAGNOSIS — Z01812 Encounter for preprocedural laboratory examination: Secondary | ICD-10-CM | POA: Diagnosis not present

## 2017-08-29 DIAGNOSIS — Z794 Long term (current) use of insulin: Secondary | ICD-10-CM | POA: Insufficient documentation

## 2017-08-29 DIAGNOSIS — E1142 Type 2 diabetes mellitus with diabetic polyneuropathy: Secondary | ICD-10-CM | POA: Insufficient documentation

## 2017-08-29 DIAGNOSIS — E1122 Type 2 diabetes mellitus with diabetic chronic kidney disease: Secondary | ICD-10-CM | POA: Insufficient documentation

## 2017-08-29 DIAGNOSIS — E1165 Type 2 diabetes mellitus with hyperglycemia: Secondary | ICD-10-CM | POA: Insufficient documentation

## 2017-08-29 DIAGNOSIS — E785 Hyperlipidemia, unspecified: Secondary | ICD-10-CM | POA: Diagnosis not present

## 2017-08-29 DIAGNOSIS — I6523 Occlusion and stenosis of bilateral carotid arteries: Secondary | ICD-10-CM | POA: Diagnosis not present

## 2017-08-29 HISTORY — DX: Other complications of anesthesia, initial encounter: T88.59XA

## 2017-08-29 HISTORY — DX: Adverse effect of unspecified anesthetic, initial encounter: T41.45XA

## 2017-08-29 NOTE — Pre-Procedure Instructions (Addendum)
Spoke to Dr Randa Lynn regarding patient upcoming procedure.  Patient had received cardiac clearance for carotid endarterectomy performed in October 2018. Note from Dr Lucky Cowboy addressing plavix and aspirin present on chart for upcoming procedure with Dr Erlene Quan. No new orders from anesthesia.

## 2017-08-29 NOTE — Patient Instructions (Signed)
Your procedure is scheduled on: Wednesday 09/03/17 Report to [place]. 2ND FLOOR MEDICAL MALL ENTRANCE. To find out your arrival time please call [phone] between 1PM - 3PM on Tuesday 09/02/17.  Remember: Instructions that are not followed completely may result in serious medical risk, up to and including death, or upon the discretion of your surgeon and anesthesiologist your surgery may need to be rescheduled.    __X__ 1. Do not eat anything after midnight the night before your    procedure.  No gum chewing or hard candies.  You may drink clear   liquids up to 2 hours before you are scheduled to arrive at the   hospital for your procedure. Do not drink clear liquids within 2   hours of scheduled arrival to the hospital as this may lead to your   procedure being delayed or rescheduled.       Clear liquids include:   Water or Apple juice without pulp   Clear carbohydrate beverage such as Clearfast or Gatorade   Black coffee or Clear Tea (no milk, no creamer, do not add anything   to the coffee or tea)    Diabetics should only drink water   __X__ 2. No Alcohol for 24 hours before or after surgery.   ____ 3. Bring all medications with you on the day of surgery if instructed.    __X__ 4. Notify your doctor if there is any change in your medical condition     (cold, fever, infections).             __X___5. No smoking within 24 hours of your surgery.     Do not wear jewelry, make-up, hairpins, clips or nail polish.  Do not wear lotions, powders, or perfumes.   Do not shave 48 hours prior to surgery. Men may shave face and neck.  Do not bring valuables to the hospital.    [facility] is not responsible for any belongings or valuables.               Contacts, dentures or bridgework may not be worn into surgery.  Leave your suitcase in the car. After surgery it may be brought to your room.  For patients admitted to the hospital, discharge time is determined by your                 treatment team.   Patients discharged the day of surgery will not be allowed to drive home.   Please read over the following fact sheets that you were given:   MRSA Information   __X__ Take these medicines the morning of surgery with A SIP OF WATER:    1. ATENOLOL  2. LEVOTHYROXINE  3. TAMSULOSIN  4.  5.  6.  ____ Fleet Enema (as directed)   ____ Use CHG Soap/SAGE wipes as directed  ____ Use inhalers on the day of surgery  ____ Stop metformin 2 days prior to surgery    __X__ Take 1/2 of usual insulin dose the night before surgery and none on the morning of surgery.   ____ Stop Plavix AS INSTRUCTED AND CONTINUE YOUR ASPIRIN  __X__ Stop Anti-inflammatories such as Advil, Aleve, Ibuprofen, Motrin, Naproxen, Naprosyn, Goodies,powder, or aspirin products.  OK to take Tylenol.   __X__ Stop supplements, Vitamin E, Fish Oil until after surgery.    ____ Bring C-Pap to the hospital.

## 2017-08-30 LAB — URINE CULTURE

## 2017-09-01 ENCOUNTER — Other Ambulatory Visit: Payer: Self-pay | Admitting: *Deleted

## 2017-09-01 DIAGNOSIS — Z9889 Other specified postprocedural states: Secondary | ICD-10-CM | POA: Diagnosis not present

## 2017-09-01 NOTE — Patient Outreach (Signed)
[facility] [facility]) Care Management  09/01/2017  [name] 1941/01/27 [id number]   Phone call to patient's wife to confirm transportation to patient's medical appointment with Dr. Kyla Balzarine today.  [employer] should arrive by 1 pm today for transport.   Sheralyn Boatman [facility] Care Management [phone]

## 2017-09-02 ENCOUNTER — Other Ambulatory Visit: Payer: Self-pay | Admitting: *Deleted

## 2017-09-02 MED ORDER — CEFAZOLIN SODIUM-DEXTROSE 2-4 GM/100ML-% IV SOLN
2.0000 g | INTRAVENOUS | Status: AC
  Administered 2017-09-03: 2 g via INTRAVENOUS

## 2017-09-02 NOTE — Patient Outreach (Signed)
[facility] [facility]) Care Management  09/02/2017  NAVDEEP HALT 1941/01/29 [id number]  Transition of care call 76 year old male with recent hospital admission Dx: AKI, UTI, urinary retention foley catheter in place,  Kyphoplasty   Successful outreach call to patient reports he is still doing pretty good, patient continues to tolerate ambulation in the home using rolling. Patient wife reports he is eating and drinking well, blood sugar are running in the 170 range.  Patient had 1st outpatient physical therapy visit on last week and plans for future visits after recovered from upcoming ureteral biopsy procedure.  Patient continues monitor blood sugar daily and readings in the 170 range. . Patient continues hoarse low tone voice and has planned ENT visit this month.  Wife reports patient foley remain in place and urine is clear.  Wife discussed patient visit to Dr.Menz for follow up regarding kyphoplasty reports he is doing well and no further follow up visit indicated, she also voiced appreciation for transportation assistance to visit "it was a good experience", it helped with family being available to help with transportation on tomorrow's visit. Wife is unsure of how long patient will be in hospital, expecting a call from hospital regarding time to arrive on tomorrow.   Wife denies any new concerns at this time.  Plan Will follow up patient progress and plan  telephone outreach in the next week or sooner.    [name], RN, [place] Management Coordinator  [phone]- Mobile [phone]- Toll Free Main Office

## 2017-09-03 ENCOUNTER — Encounter
Admission: RE | Disposition: A | Discharge status: Home / Self Care | Payer: Self-pay | Source: Ambulatory Visit | Attending: Urology

## 2017-09-03 ENCOUNTER — Other Ambulatory Visit: Payer: Self-pay

## 2017-09-03 ENCOUNTER — Ambulatory Visit: Payer: PPO | Admitting: Anesthesiology

## 2017-09-03 ENCOUNTER — Encounter: Payer: Self-pay | Admitting: *Deleted

## 2017-09-03 ENCOUNTER — Ambulatory Visit
Admission: RE | Admit: 2017-09-03 | Discharge: 2017-09-03 | Disposition: A | Discharge status: Home / Self Care | Payer: PPO | Source: Ambulatory Visit | Attending: Urology | Admitting: Urology

## 2017-09-03 DIAGNOSIS — Z9079 Acquired absence of other genital organ(s): Secondary | ICD-10-CM | POA: Diagnosis not present

## 2017-09-03 DIAGNOSIS — I739 Peripheral vascular disease, unspecified: Secondary | ICD-10-CM | POA: Diagnosis not present

## 2017-09-03 DIAGNOSIS — Z7902 Long term (current) use of antithrombotics/antiplatelets: Secondary | ICD-10-CM | POA: Insufficient documentation

## 2017-09-03 DIAGNOSIS — Z9049 Acquired absence of other specified parts of digestive tract: Secondary | ICD-10-CM | POA: Diagnosis not present

## 2017-09-03 DIAGNOSIS — R1900 Intra-abdominal and pelvic swelling, mass and lump, unspecified site: Secondary | ICD-10-CM | POA: Diagnosis not present

## 2017-09-03 DIAGNOSIS — E039 Hypothyroidism, unspecified: Secondary | ICD-10-CM | POA: Diagnosis not present

## 2017-09-03 DIAGNOSIS — Z833 Family history of diabetes mellitus: Secondary | ICD-10-CM | POA: Insufficient documentation

## 2017-09-03 DIAGNOSIS — I129 Hypertensive chronic kidney disease with stage 1 through stage 4 chronic kidney disease, or unspecified chronic kidney disease: Secondary | ICD-10-CM | POA: Diagnosis not present

## 2017-09-03 DIAGNOSIS — Z801 Family history of malignant neoplasm of trachea, bronchus and lung: Secondary | ICD-10-CM | POA: Insufficient documentation

## 2017-09-03 DIAGNOSIS — Z8673 Personal history of transient ischemic attack (TIA), and cerebral infarction without residual deficits: Secondary | ICD-10-CM | POA: Diagnosis not present

## 2017-09-03 DIAGNOSIS — E785 Hyperlipidemia, unspecified: Secondary | ICD-10-CM | POA: Diagnosis not present

## 2017-09-03 DIAGNOSIS — E079 Disorder of thyroid, unspecified: Secondary | ICD-10-CM | POA: Insufficient documentation

## 2017-09-03 DIAGNOSIS — E1122 Type 2 diabetes mellitus with diabetic chronic kidney disease: Secondary | ICD-10-CM | POA: Diagnosis not present

## 2017-09-03 DIAGNOSIS — Z85528 Personal history of other malignant neoplasm of kidney: Secondary | ICD-10-CM | POA: Insufficient documentation

## 2017-09-03 DIAGNOSIS — Z7982 Long term (current) use of aspirin: Secondary | ICD-10-CM | POA: Diagnosis not present

## 2017-09-03 DIAGNOSIS — Z808 Family history of malignant neoplasm of other organs or systems: Secondary | ICD-10-CM | POA: Insufficient documentation

## 2017-09-03 DIAGNOSIS — Z8249 Family history of ischemic heart disease and other diseases of the circulatory system: Secondary | ICD-10-CM | POA: Diagnosis not present

## 2017-09-03 DIAGNOSIS — D49519 Neoplasm of unspecified behavior of unspecified kidney: Secondary | ICD-10-CM

## 2017-09-03 DIAGNOSIS — I2510 Atherosclerotic heart disease of native coronary artery without angina pectoris: Secondary | ICD-10-CM | POA: Diagnosis not present

## 2017-09-03 DIAGNOSIS — Z79899 Other long term (current) drug therapy: Secondary | ICD-10-CM | POA: Insufficient documentation

## 2017-09-03 DIAGNOSIS — Z794 Long term (current) use of insulin: Secondary | ICD-10-CM | POA: Diagnosis not present

## 2017-09-03 DIAGNOSIS — Z888 Allergy status to other drugs, medicaments and biological substances status: Secondary | ICD-10-CM | POA: Diagnosis not present

## 2017-09-03 DIAGNOSIS — Z951 Presence of aortocoronary bypass graft: Secondary | ICD-10-CM | POA: Insufficient documentation

## 2017-09-03 DIAGNOSIS — N2889 Other specified disorders of kidney and ureter: Secondary | ICD-10-CM | POA: Diagnosis not present

## 2017-09-03 DIAGNOSIS — N189 Chronic kidney disease, unspecified: Secondary | ICD-10-CM | POA: Diagnosis not present

## 2017-09-03 DIAGNOSIS — N200 Calculus of kidney: Secondary | ICD-10-CM | POA: Diagnosis not present

## 2017-09-03 DIAGNOSIS — I10 Essential (primary) hypertension: Secondary | ICD-10-CM | POA: Diagnosis not present

## 2017-09-03 DIAGNOSIS — I252 Old myocardial infarction: Secondary | ICD-10-CM | POA: Insufficient documentation

## 2017-09-03 DIAGNOSIS — Z85038 Personal history of other malignant neoplasm of large intestine: Secondary | ICD-10-CM | POA: Diagnosis not present

## 2017-09-03 DIAGNOSIS — Z87891 Personal history of nicotine dependence: Secondary | ICD-10-CM | POA: Diagnosis not present

## 2017-09-03 DIAGNOSIS — E1151 Type 2 diabetes mellitus with diabetic peripheral angiopathy without gangrene: Secondary | ICD-10-CM | POA: Diagnosis not present

## 2017-09-03 DIAGNOSIS — R339 Retention of urine, unspecified: Secondary | ICD-10-CM | POA: Insufficient documentation

## 2017-09-03 HISTORY — PX: HOLMIUM LASER APPLICATION: SHX5852

## 2017-09-03 HISTORY — PX: CYSTOSCOPY WITH STENT PLACEMENT: SHX5790

## 2017-09-03 HISTORY — PX: CYSTOSCOPY W/ RETROGRADES: SHX1426

## 2017-09-03 LAB — GLUCOSE, CAPILLARY
Glucose-Capillary: 238 mg/dL — ABNORMAL HIGH (ref 65–99)
Glucose-Capillary: 182 mg/dL — ABNORMAL HIGH (ref 65–99)

## 2017-09-03 SURGERY — CYSTOSCOPY, WITH STENT INSERTION
Anesthesia: General | Site: Ureter | Laterality: Right | Wound class: Clean Contaminated

## 2017-09-03 MED ORDER — SUGAMMADEX SODIUM 200 MG/2ML IV SOLN
INTRAVENOUS | Status: AC
  Filled 2017-09-03: qty 2

## 2017-09-03 MED ORDER — FENTANYL CITRATE (PF) 100 MCG/2ML IJ SOLN: 25.0000 ug | INTRAMUSCULAR | Status: DC | PRN

## 2017-09-03 MED ORDER — ROCURONIUM BROMIDE 100 MG/10ML IV SOLN
INTRAVENOUS | Status: DC | PRN
  Administered 2017-09-03: 10 mg via INTRAVENOUS
  Administered 2017-09-03: 5 mg via INTRAVENOUS
  Administered 2017-09-03: 25 mg via INTRAVENOUS
  Administered 2017-09-03: 10 mg via INTRAVENOUS

## 2017-09-03 MED ORDER — FENTANYL CITRATE (PF) 100 MCG/2ML IJ SOLN
INTRAMUSCULAR | Status: DC | PRN
  Administered 2017-09-03: 100 ug via INTRAVENOUS

## 2017-09-03 MED ORDER — DEXAMETHASONE SODIUM PHOSPHATE 10 MG/ML IJ SOLN
INTRAMUSCULAR | Status: AC
  Filled 2017-09-03: qty 1

## 2017-09-03 MED ORDER — LIDOCAINE HCL (PF) 2 % IJ SOLN
INTRAMUSCULAR | Status: AC
  Filled 2017-09-03: qty 10

## 2017-09-03 MED ORDER — ROCURONIUM BROMIDE 50 MG/5ML IV SOLN
INTRAVENOUS | Status: AC
  Filled 2017-09-03: qty 1

## 2017-09-03 MED ORDER — PHENYLEPHRINE HCL 10 MG/ML IJ SOLN
INTRAMUSCULAR | Status: DC | PRN
Start: 2017-09-03 — End: 2017-09-03
  Administered 2017-09-03 (×4): 100 ug via INTRAVENOUS
  Administered 2017-09-03 (×2): 200 ug via INTRAVENOUS
  Administered 2017-09-03: 100 ug via INTRAVENOUS

## 2017-09-03 MED ORDER — PROPOFOL 10 MG/ML IV BOLUS
INTRAVENOUS | Status: DC | PRN
  Administered 2017-09-03: 150 mg via INTRAVENOUS

## 2017-09-03 MED ORDER — HYDROCODONE-ACETAMINOPHEN 5-325 MG PO TABS: 1.0000 | ORAL_TABLET | Freq: Four times a day (QID) | ORAL | 0 refills | Status: DC | PRN

## 2017-09-03 MED ORDER — FLUCONAZOLE 100 MG PO TABS: 100.0000 mg | ORAL_TABLET | Freq: Every day | ORAL | 0 refills | Status: AC

## 2017-09-03 MED ORDER — PROPOFOL 10 MG/ML IV BOLUS
INTRAVENOUS | Status: AC
  Filled 2017-09-03: qty 20

## 2017-09-03 MED ORDER — ONDANSETRON HCL 4 MG/2ML IJ SOLN
INTRAMUSCULAR | Status: AC
  Filled 2017-09-03: qty 2

## 2017-09-03 MED ORDER — DEXAMETHASONE SODIUM PHOSPHATE 10 MG/ML IJ SOLN
INTRAMUSCULAR | Status: DC | PRN
  Administered 2017-09-03: 5 mg via INTRAVENOUS

## 2017-09-03 MED ORDER — PHENYLEPHRINE HCL 10 MG/ML IJ SOLN
INTRAMUSCULAR | Status: DC | PRN
  Administered 2017-09-03: 25 ug/min via INTRAVENOUS

## 2017-09-03 MED ORDER — FENTANYL CITRATE (PF) 100 MCG/2ML IJ SOLN
INTRAMUSCULAR | Status: AC
  Filled 2017-09-03: qty 2

## 2017-09-03 MED ORDER — SUCCINYLCHOLINE CHLORIDE 20 MG/ML IJ SOLN
INTRAMUSCULAR | Status: AC
  Filled 2017-09-03: qty 1

## 2017-09-03 MED ORDER — SODIUM CHLORIDE 0.9 % IV SOLN
INTRAVENOUS | Status: DC
  Administered 2017-09-03: 12:00:00 via INTRAVENOUS

## 2017-09-03 MED ORDER — ONDANSETRON HCL 4 MG/2ML IJ SOLN
4.0000 mg | Freq: Once | INTRAMUSCULAR | Status: DC | PRN
Start: 2017-09-03 — End: 2017-09-03

## 2017-09-03 MED ORDER — SUGAMMADEX SODIUM 200 MG/2ML IV SOLN
INTRAVENOUS | Status: DC | PRN
  Administered 2017-09-03: 180 mg via INTRAVENOUS

## 2017-09-03 MED ORDER — LIDOCAINE HCL (CARDIAC) 20 MG/ML IV SOLN
INTRAVENOUS | Status: DC | PRN
  Administered 2017-09-03: 60 mg via INTRAVENOUS

## 2017-09-03 MED ORDER — ONDANSETRON HCL 4 MG/2ML IJ SOLN
INTRAMUSCULAR | Status: DC | PRN
  Administered 2017-09-03: 4 mg via INTRAVENOUS

## 2017-09-03 MED ORDER — IOTHALAMATE MEGLUMINE 43 % IV SOLN
INTRAVENOUS | Status: DC | PRN
  Administered 2017-09-03: 15 mL via URETHRAL

## 2017-09-03 MED ORDER — FAMOTIDINE 20 MG PO TABS
ORAL_TABLET | ORAL | Status: AC
  Filled 2017-09-03: qty 1

## 2017-09-03 MED ORDER — SUCCINYLCHOLINE CHLORIDE 20 MG/ML IJ SOLN
INTRAMUSCULAR | Status: DC | PRN
  Administered 2017-09-03: 100 mg via INTRAVENOUS

## 2017-09-03 MED ORDER — FAMOTIDINE 20 MG PO TABS
20.0000 mg | ORAL_TABLET | Freq: Once | ORAL | Status: AC
  Administered 2017-09-03: 20 mg via ORAL

## 2017-09-03 MED ORDER — FLUCONAZOLE 100MG IVPB
100.0000 mg | Freq: Once | INTRAVENOUS | Status: AC
  Administered 2017-09-03: 100 mg via INTRAVENOUS
  Filled 2017-09-03: qty 50

## 2017-09-03 SURGICAL SUPPLY — 29 items
BAG DRAIN CYSTO-URO LG1000N (MISCELLANEOUS) ×5 IMPLANT
BAG URINE DRAINAGE (UROLOGICAL SUPPLIES) ×5 IMPLANT
BASKET ZERO TIP 1.9FR (BASKET) ×5 IMPLANT
BRUSH SCRUB EZ  4% CHG (MISCELLANEOUS) ×1
BRUSH SCRUB EZ 4% CHG (MISCELLANEOUS) ×4 IMPLANT
CATH COUDE FOLEY 2W 5CC 16FR (CATHETERS) ×5 IMPLANT
CATH URETL 5X70 OPEN END (CATHETERS) ×5 IMPLANT
CONRAY 43 FOR UROLOGY 50M (MISCELLANEOUS) ×5 IMPLANT
DRAPE UTILITY 15X26 TOWEL STRL (DRAPES) ×5 IMPLANT
FIBER LASER LITHO 273 (Laser) ×5 IMPLANT
GLOVE BIO SURGEON STRL SZ 6.5 (GLOVE) ×5 IMPLANT
GOWN STRL REUS W/ TWL LRG LVL3 (GOWN DISPOSABLE) ×8 IMPLANT
GOWN STRL REUS W/TWL LRG LVL3 (GOWN DISPOSABLE) ×2
GUIDEWIRE GREEN .038 145CM (MISCELLANEOUS) IMPLANT
GUIDEWIRE SUPER STIFF (WIRE) ×5 IMPLANT
HOLDER FOLEY CATH W/STRAP (MISCELLANEOUS) ×5 IMPLANT
KIT RM TURNOVER CYSTO AR (KITS) ×5 IMPLANT
PACK CYSTO AR (MISCELLANEOUS) ×5 IMPLANT
SENSORWIRE 0.038 NOT ANGLED (WIRE) ×10
SET CYSTO W/LG BORE CLAMP LF (SET/KITS/TRAYS/PACK) ×5 IMPLANT
SHEATH URETERAL 12FR 45CM (SHEATH) ×5 IMPLANT
SOL .9 NS 3000ML IRR  AL (IV SOLUTION) ×1
SOL .9 NS 3000ML IRR UROMATIC (IV SOLUTION) ×4 IMPLANT
STENT URET 6FRX24 CONTOUR (STENTS) IMPLANT
STENT URET 6FRX26 CONTOUR (STENTS) ×5 IMPLANT
SURGILUBE 2OZ TUBE FLIPTOP (MISCELLANEOUS) ×5 IMPLANT
SYRINGE IRR TOOMEY STRL 70CC (SYRINGE) IMPLANT
WATER STERILE IRR 1000ML POUR (IV SOLUTION) ×5 IMPLANT
WIRE SENSOR 0.038 NOT ANGLED (WIRE) ×8 IMPLANT

## 2017-09-03 NOTE — Anesthesia Post-op Follow-up Note (Signed)
Anesthesia QCDR form completed.        

## 2017-09-03 NOTE — Discharge Instructions (Signed)
You have a ureteral stent in place.  This is a tube that extends from your kidney to your bladder.  This may cause urinary bleeding, burning with urination, and urinary frequency.  Please call our office or present to the ED if you develop fevers >101 or pain which is not able to be controlled with oral pain medications.  You may be given either Flomax and/ or ditropan to help with bladder spasms and stent pain in addition to pain medications.    [facility] [address], [address] [place], [place] [address] [phone] 

## 2017-09-03 NOTE — Anesthesia Preprocedure Evaluation (Addendum)
Anesthesia Evaluation  Patient identified by MRN, date of birth, ID band Patient awake    Reviewed: Allergy & Precautions, NPO status , Patient's Chart, lab work & pertinent test results, reviewed documented beta blocker date and time   History of Anesthesia Complications (+) history of anesthetic complications  Airway Mallampati: III  TM Distance: >3 FB     Dental  (+) Chipped   Pulmonary pneumonia, resolved, former smoker,           Cardiovascular hypertension, Pt. on medications and Pt. on home beta blockers + CAD, + Past MI, + CABG and + Peripheral Vascular Disease  + dysrhythmias      Neuro/Psych  Neuromuscular disease CVA    GI/Hepatic   Endo/Other  diabetes, Type 2Hypothyroidism   Renal/GU CRFRenal disease     Musculoskeletal   Abdominal   Peds  Hematology   Anesthesia Other Findings Gout.Colon ca. Stage 4 kidneys.CEA done in October with aspiration pneumonia. Improving. Saturations somewhat low. EKG ok. Echo 44. He has only a few teeth. None loose.  Reproductive/Obstetrics                           Anesthesia Physical Anesthesia Plan  ASA: III  Anesthesia Plan: General   Post-op Pain Management:    Induction: Intravenous  PONV Risk Score and Plan:   Airway Management Planned: Oral ETT  Additional Equipment:   Intra-op Plan:   Post-operative Plan:   Informed Consent: I have reviewed the patients History and Physical, chart, labs and discussed the procedure including the risks, benefits and alternatives for the proposed anesthesia with the patient or authorized representative who has indicated his/her understanding and acceptance.     Plan Discussed with: CRNA  Anesthesia Plan Comments:         Anesthesia Quick Evaluation

## 2017-09-03 NOTE — Interval H&P Note (Signed)
History and Physical Interval Note:  09/03/2017 1:34 PM  [name]  has presented today for surgery, with the diagnosis of Right renal pelvis mass  The various methods of treatment have been discussed with the patient and family. After consideration of risks, benefits and other options for treatment, the patient has consented to  Procedure(s): URETERAL BIOPSY (Right) CYSTOSCOPY WITH STENT PLACEMENT (Right) CYSTOSCOPY WITH RETROGRADE PYELOGRAM (Bilateral) as a surgical intervention .  The patient's history has been reviewed, patient examined, no change in status, stable for surgery.  I have reviewed the patient's chart and labs.  Questions were answered to the patient's satisfaction.    RRR CTAB  Hollice Espy

## 2017-09-03 NOTE — Anesthesia Procedure Notes (Signed)
Procedure Name: Intubation Date/Time: 09/03/2017 2:04 PM Performed by: Dionne Bucy, CRNA Pre-anesthesia Checklist: Patient identified, Patient being monitored, Timeout performed, Emergency Drugs available and Suction available Patient Re-evaluated:Patient Re-evaluated prior to induction Oxygen Delivery Method: Circle system utilized Preoxygenation: Pre-oxygenation with 100% oxygen Induction Type: IV induction Ventilation: Mask ventilation without difficulty Laryngoscope Size: Mac and 4 Grade View: Grade I Tube type: Oral Tube size: 7.5 mm Number of attempts: 1 Airway Equipment and Method: Stylet Placement Confirmation: ETT inserted through vocal cords under direct vision,  positive ETCO2 and breath sounds checked- equal and bilateral Secured at: 24 cm Tube secured with: Tape Dental Injury: Teeth and Oropharynx as per pre-operative assessment

## 2017-09-03 NOTE — Transfer of Care (Signed)
Immediate Anesthesia Transfer of Care Note  Patient: [name]  Procedure(s) Performed: CYSTOSCOPY WITH STENT PLACEMENT (Right Ureter) CYSTOSCOPY WITH RETROGRADE PYELOGRAM (Bilateral Ureter) HOLMIUM LASER APPLICATION (N/A )  Patient Location: PACU  Anesthesia Type:General  Level of Consciousness: sedated  Airway & Oxygen Therapy: Patient Spontanous Breathing and Patient connected to face mask oxygen  Post-op Assessment: Report given to RN and Post -op Vital signs reviewed and stable  Post vital signs: Reviewed and stable  Last Vitals:  Vitals:   09/03/17 1135 09/03/17 1545  BP: (!) 116/93 (!) 171/70  Pulse: 74 66  Resp: 18 19  Temp: (!) 35.9 C (!) 36.2 C  SpO2: 100% 99%    Last Pain: There were no vitals filed for this visit.       Complications: No apparent anesthesia complications

## 2017-09-03 NOTE — Op Note (Signed)
Date of procedure: 09/03/17  Preoperative diagnosis:  1. Right renal pelvic mass 2. Urinary retention  Postoperative diagnosis:  1. Right renal pelvic stone  Procedure: 1. Cystoscopy 2. Bilateral retrograde pyelogram 3. Right ureteroscopy 4. Laser lithotripsy 5. Right ureteral stent placement 6. Basket extraction of stone fragments  Surgeon: Hollice Espy, MD  Anesthesia: General  Complications: None  Intraoperative findings: Unremarkable left retrograde pyelogram.  Minimal residual prostate tissue status post simple prostatectomy.  Right ureteral mass consistent with a very soft lamellar stone versus high-grade stone/fungal ball.  EBL: Minimal  Specimens: Stone fragments/urine culture from right kidney  Drains: 6 x 26 French double-J ureteral stent  Indication: [name] is a 76 y.o. 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  He is counseled to undergo diagnostic ureteroscopy, bilateral retrograde cystoscopy.  After reviewing the management options for treatment, he elected to proceed with the above surgical procedure(s). We have discussed the potential benefits and risks of the procedure, side effects of the proposed treatment, the likelihood of the patient achieving the goals of the procedure, and any potential problems that might occur during the procedure or recuperation. Informed consent has been obtained.  Description of procedure:  The patient was taken to the operating room and general anesthesia was induced.  The patient was placed in the dorsal lithotomy position, prepped and draped in the usual sterile fashion, and preoperative antibiotics were administered. A preoperative time-out was performed.   A 21 French cystoscope was advanced per urethra into the bladder.  Of note, the patient's prostate was mostly surgically absent with only a very small amount of prostate tissue with a wide open prostatic fossa.  This is  consistent with his known history of simple prostatectomy.  His bladder was heavily trabeculated with several diverticula, widemouth on either side of the bladder.  He also had several saccules.  There is very mild erythema on the posterior wall of the bladder which was consistent with catheter cystitis.  There are no other lesions or stones within the bladder.  He was fairly unremarkable.  Attention was then turned to the left ureteral orifice was cannulated using a 5 [place] open-ended ureteral catheter.  There was some J hooking of the distal ureter, otherwise decompressed delicate appearing ureter without hydroureteronephrosis or filling defects.  Attention was then turned to the right ureteral orifice which was cannulated with a 5 [place] open-ended ureteral catheter.  A gentle retrograde pyelogram on this side also revealed a decompressed ureter but a filling defect just within the renal pelvis which was consistent with the lesion which had been seen on the CT scan.  There is some mild hydronephrosis as well on the side.  A sensor wire was then placed up to the level of the kidney and stopped in place as a safety wire.  A second Super Stiff rigid wire was placed up to the level of the kidney.  At this point time, a 8 French flexible ureteroscope was advanced over the Super Stiff wire up to the renal pelvis without difficulty.  Each and every calyx was then directly visualized.  Within the renal pelvis later pushed into a calyx was a approximately 1.5 cm spherical pale whitish colored smooth surface ball which was not adherent to the overlying mucosa.  This did not move with manipulation.  It was extremely soft and with manipulation, pieces of the presumed stone broke off easily.  It was unclear whether this represented a fungal ball or a  partially calcified fungal ball.  At this point time, 273 m laser fiber was then brought in and using settings of 0.8 J and 10 Hz, the stone was fragmented.  Upon lasering  the stone, it appeared to have the appearance much like baklava sheetlike layers which fragmented very easily.  Given the size of the lesion, there is a large amount of debris.  Noted to facilitate removal of this debris, I replaced the Super Stiff wire and advanced a ureteral access sheath, Lacinda Axon 12/14 French up to the proximal ureter.  Once the inner cannula was removed and the kidney drained rapidly, a large amount of sediment/debris was drained.  The kidney was then irrigated copiously and a large amount of the debris was able to be cleared using this method.  Finally, 1.9 tipless nitinol basket was used to extract the residual debris.  Of note, the debris was very soft and mushy.  Urine culture was obtained and the debris was sent for stone analysis.  Once the kidney was recently cleared of all significant stone debris, the scope was back to like the ureter inspected along the way.  There is no ureteral injury appreciated.  A 6 x 26 French double-J ureteral stent was then advanced over the backloaded safety wire up to the level of the kidney.  The wire is partially drawn until full because noted within the renal pelvis.  The wires and fluid drawn a focal was noted within the bladder.  The bladder was then drained.  A 16 French coud tip Foley catheter was then replaced and the balloon was filled with 10 cc of sterile water.  The patient was then clean and dry, repositioned in supine position, reversed from anesthesia, taken to the PACU in stable condition.  Plan: Patient will return next week for cystoscopy, stent removal.  At that point time, we will further address his urinary retention although suspect it may be related to neurogenic bladder or bladder dysfunction given and not particularly obstructive appearing prostate.  Given his preop culture growing yeast as well as concern for the stone being hybrid stone/fungal ball, did go ahead and treat him with 10 days of fluconazole of which 1 dose was given  intraoperatively in the OR.  All findings were discussed with the patient's family.  Hollice Espy, M.D.

## 2017-09-03 NOTE — OR Nursing (Signed)
Pt complains of feeling dizzy and neck hurting.

## 2017-09-05 ENCOUNTER — Other Ambulatory Visit: Payer: Self-pay | Admitting: *Deleted

## 2017-09-05 LAB — URINE CULTURE: Culture: NO GROWTH

## 2017-09-05 NOTE — Patient Outreach (Signed)
[facility]) Care Management  09/05/2017  [name] 09/14/1941 [id number]   Telephone follow up call  76 year old male on 11/21  Cystoscopy procedure  Successful outreach call, spoke with wife reports patient is doing great, states they were relieved by procedure report no cancer, stent placed. Patient has foley in place she reports foley in place and urine is clearing up, initially blood colored urine now, very pale pink color, she states she was to call if urine became catsup colored. Patient remains off plavix until MD follow up visit, he is on 10 day course of antibiotic after procedure.   Wife reports patient appetite and intake is very good , and he continues to tolerate activity in home with walker. She discussed they planned to return to therapy once some medical appointments have decreased. Patient has 3 medical follow up visits on next week. Vascular MD, 11/26, ENT, 11/28 and Urology 11/30 per wife.  LCSW following patient for assistance with transportation needs   Plan  Will plan transition of care call on next week.   [name], RN, [place] Management Coordinator  [phone]- Mobile [phone]- Toll Free Main Office

## 2017-09-05 NOTE — Patient Outreach (Addendum)
[facility]) Care Management  09/05/2017  [name] 02/03/1941 [id number]   Phone call to patient and patient;s spouse to follow up on transportation arrangements to patient's medical appointments. Per patient's spouse, she was appreciative of the [facility] transportation provided to Dr. Rudene Christians, however patient has several additional appointments that she needs assistance with. Per patient, she contacted [facility] and was told that she needed Medicaid in order to use the services. This social worker informed patient's spouse that Medicaid is not a requirement to use the service and will confirm this on Monday, 09/08/17 as they are closed today. Patient has an appointment on 09/09/17 with  [facility], however due to [facility] being closed today, arrangements cannot be made. Per patient's spouse, her daughter will provide the transportation to his appointment on 09/12/17.   Plan: This social worker to arrange transportation through [facility] to the Vein and Vascular doctor This social worker will confirm with [employer] on Monday regarding eligibility requirements and will encourage patient's spouse to contact them to arrange future rides to medical appointments.    Sheralyn Boatman [facility] Care Management [phone]

## 2017-09-08 ENCOUNTER — Other Ambulatory Visit: Payer: Self-pay | Admitting: *Deleted

## 2017-09-08 ENCOUNTER — Encounter: Payer: Self-pay | Admitting: Urology

## 2017-09-08 NOTE — Patient Outreach (Signed)
[facility]) Care Management  09/08/2017  BELVIN GAUSS [birth year] [id number]   Phone call to patient and patient's spouse to confirm transportation arrangements for 09/09/17 to see the Vascular doctor. Per patient's spouse, her daughter will transport patient to thnte Urology appointment on 09/12/17, however they are currently working on arranging transportation to the ENT MD. [place] [facility] [place] at this [place]. It was suggested that patient and spouse begin arranging transportation through family and friends in enough time so that they can arrange their schedules. Patient's spouse agrees and feels that she will be able to identify family and friends to assist with transportation needs to medical appointments.   Sheralyn Boatman [facility] Care Management [phone]

## 2017-09-08 NOTE — Patient Outreach (Signed)
[facility]) Care Management  09/08/2017  [name] 07/29/1941 [id number]   Phone call to [facility] to discuss transportation eligibility.  This social worker was informed that due to budget cuts, they are not taking on any new riders unless the person has Medicaid.   If the rider was receiving rides in the past, they will need to give at least 30 days notice to schedule any additional rides.  Plan:This Education officer, museum will inform patient.   Sheralyn Boatman [facility] Care Management [phone]

## 2017-09-08 NOTE — Anesthesia Postprocedure Evaluation (Signed)
Anesthesia Post Note  Patient: [name]  Procedure(s) Performed: CYSTOSCOPY WITH STENT PLACEMENT (Right Ureter) CYSTOSCOPY WITH RETROGRADE PYELOGRAM (Bilateral Ureter) HOLMIUM LASER APPLICATION (N/A )  Patient location during evaluation: PACU Anesthesia Type: General Level of consciousness: awake Pain management: pain level controlled Vital Signs Assessment: post-procedure vital signs reviewed and stable Respiratory status: nonlabored ventilation Cardiovascular status: stable Anesthetic complications: no     Last Vitals:  Vitals:   09/03/17 1639 09/03/17 1735  BP: (!) 152/65 (!) 141/66  Pulse: 63 62  Resp: 16   Temp: 36.6 C   SpO2: 98% 99%    Last Pain:  Vitals:   09/05/17 0917  PainSc: 0-No pain                 VAN STAVEREN,Halim Surrette

## 2017-09-09 ENCOUNTER — Encounter (INDEPENDENT_AMBULATORY_CARE_PROVIDER_SITE_OTHER): Payer: PPO

## 2017-09-09 ENCOUNTER — Ambulatory Visit (INDEPENDENT_AMBULATORY_CARE_PROVIDER_SITE_OTHER): Payer: PPO | Admitting: Vascular Surgery

## 2017-09-10 ENCOUNTER — Other Ambulatory Visit: Payer: Self-pay | Admitting: *Deleted

## 2017-09-10 DIAGNOSIS — R490 Dysphonia: Secondary | ICD-10-CM | POA: Diagnosis not present

## 2017-09-10 NOTE — Patient Outreach (Signed)
[facility]) Care Management  09/10/2017  OZELL JUHASZ [birth year] [id number]  Transition of care call   76 year old male with recent hospital admission, discharged to home on  Dx: AKI, UTI, urinary retention foley catheter in place,  Kyphoplasty 11/1. 11/21 Cystoscopy with stent placement procedure   Successful outreach call to patient, reports he is doing pretty good, patients' voice is still hoarse and low tone, able tot understand over the phone. Patient discussed visit with ENT on today and states he has pinched vocal cord no new procedures planned will follow up again on 1/9.  Patient discussed having to reschedule his office visit with Dr.Dew on yesterday due to arranged  transportation being 1 1/2 hour late he reports they had difficulty with directions to his home. Patient has rescheduled visit for next month, he is hoping that family will be available to help with transportation, discussed he was  hoping to be able to get some of MD visits done.  Patient discussed being tired of having foley catheter in place, and hopes at appointment on 11/30 that it will be will be able to be removed. Patient states family will help with transportation to that visit . Patient reports urine has gone from rosy and now  urine is clear colored. He still remains off Plavix since urology procedure and hopes to find out at visit on Friday when to resume.   Patient reports he continues to tolerate ambulation in the home and he drove to end of his street recently, but he will not drive to appointments , "I just wanted to see if I could do it', discussed safety precautions with patient and encouraged to allow family to assist with driving, until follow up with Dr.Dew, patient agreed.  Plan Will continue to follow patient for weekly outreaches for transition of care , next call in a week.    [name], RN, [place] Management Coordinator  [phone]  Mobile [phone]- Toll Free Main Office

## 2017-09-10 NOTE — Patient Outreach (Signed)
[facility]) Care Management  09/10/2017  [name] February 06, 1941 [id number]   Phone call from [place] stating that patient did not get to his appointment with his vascular doctor due to the transportation provided being late. Appointment re-scheduled for 09/26/17. This Education officer, museum to arrange transportation through [facility]).   Sheralyn Boatman [facility] Care Management [phone]

## 2017-09-11 ENCOUNTER — Ambulatory Visit: Payer: PPO

## 2017-09-12 ENCOUNTER — Encounter: Payer: Self-pay | Admitting: Urology

## 2017-09-12 ENCOUNTER — Ambulatory Visit (INDEPENDENT_AMBULATORY_CARE_PROVIDER_SITE_OTHER): Payer: PPO | Admitting: Urology

## 2017-09-12 VITALS — BP 77/44 | HR 81 | Ht 74.0 in

## 2017-09-12 DIAGNOSIS — N1330 Unspecified hydronephrosis: Secondary | ICD-10-CM

## 2017-09-12 DIAGNOSIS — R339 Retention of urine, unspecified: Secondary | ICD-10-CM | POA: Diagnosis not present

## 2017-09-12 MED ORDER — LIDOCAINE HCL 2 % EX GEL: 1.0000 "application " | Freq: Once | CUTANEOUS | Status: DC

## 2017-09-12 MED ORDER — CIPROFLOXACIN HCL 500 MG PO TABS: 500.0000 mg | ORAL_TABLET | Freq: Once | ORAL | Status: DC

## 2017-09-12 NOTE — Progress Notes (Signed)
   09/12/17  CC:  Chief Complaint  Patient presents with  . Cysto    HPI:  76 year old white male admitted for right ureteral stent removal after undergoing right ureteroscopy for a stone vs fungal ball.  Has urinary retention as described below.  He does have a history of simple prostatectomy in his prostatic fossa was wide open at the time of his procedure.  He was started on antifungals as a precaution.   Background history:  He underwent a right carotid endarterectomy at the beginning of the October 2018 and then fell resulting in a fracture of T12 a few weeks ago. Over the month he has had progressive weak stream and hesitancy and feeling of incomplete emptying. He underwent CT scan which showed a distended bladder and small bilateral bladder diverticula and a small prostate. He also had acute kidney injury which looked to be prerenal. Baseline creatinine around 2.8 or 3. Patient reports what sounds like an open simple prostatectomy in the mid 90s which is likely why the prostate looks small on the CT yet he has diverticula. A Foley catheter was placed and he is made good urine now that he has been resuscitated. He is on tamsulosin.  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 Patient recalls gross hematuria after starting Plavix following the carotid endarterectomy earlier this month. Urine was red. He has not had clots. Urine is light pink currently.  He presents today for a trial of void also to discuss right ureteroscopy for further evaluation of the right renal pelvic mass.    There were no vitals taken for this visit. NED. A&Ox3.   No respiratory distress   Abd soft, NT, ND Normal phallus with bilateral descended testicles  Cystoscopy Procedure Note  Patient identification was confirmed, informed consent was obtained, and patient was prepped using Betadine solution.  Lidocaine jelly was administered per urethral meatus.    Preoperative abx  where received prior to procedure.     Pre-Procedure: - Inspection reveals a normal caliber ureteral meatus.  Procedure: The flexible cystoscope was introduced without difficulty - No urethral strictures/lesions are present. -Right ureteral stent grasped with flexible graspers removed per meatus intact.   Post-Procedure: - Patient tolerated the procedure well  Assessment/ Plan:  1.  Urinary retention The etiology of this is not clear.  8 though likely may involve a component of neurogenic bladder as his prostatic urethra is wide open after simple prostatectomy.  I have recommended he undergo urodynamic studies at this time.  2.  Right fungal ball versus stone Patient will follow-up in 1 month after undergoing urodynamic studies with renal ultrasound prior to rule out iatrogenic hydronephrosis.  We will go over his analysis at that time as well.

## 2017-09-15 ENCOUNTER — Other Ambulatory Visit: Payer: Self-pay | Admitting: *Deleted

## 2017-09-15 ENCOUNTER — Encounter: Payer: Self-pay | Admitting: *Deleted

## 2017-09-15 LAB — STONE ANALYSIS: STONE WEIGHT KSTONE: 277 mg

## 2017-09-15 NOTE — Patient Outreach (Addendum)
[facility]) Care Management  09/15/2017  [name] 1940/12/25 [id number]   Phone call to patient and patient's wife to discuss transportation needs for re-scheduled appointment with the Vascular doctor. Per patient, he will no longer need myappointmate as he hs decided to drive himself to the appointment. Per patient's wife, she will be going with him. Per patient's spouse, he has his walker and his cain and has been driving to the store and other appointments. She feels confident that he will be able to drive himself to the appointment. Patient and spouse verbalized having no additional social work needs. Patient to be closed to social work at this time. This social worker's contact information provided if additional needs arise in the future.   Plan: This Education officer, museum will cancel the request for transportation with            [employer].  This social worker will closed patient to social work at this time.   Sheralyn Boatman [facility] Care Management [phone]

## 2017-09-16 ENCOUNTER — Other Ambulatory Visit: Payer: Self-pay | Admitting: *Deleted

## 2017-09-16 NOTE — Patient Outreach (Signed)
[facility] [facility]) Care Management  09/16/2017  [name] 1941/04/08 [id number]   Transition of care call  Successful outreach call , patient reports feeling a little stronger tolerating ambulation in home using walker at times and driving local short distances., denies dizziness, reinforced do not drive if weakness or dizzy for safety.   Patient voice tone is still very low and raspy, discussed follow up with ENT in about a month with hopes voice will clear without intervention.  Patient discussed his recent urology visit , stent removed ,foley catheter remains, states he needs further testing at a [facility] urology office they have contact information and their  daughter is helping with scheduling visit. Patient reports urine remains clear. Wife reports patient's blood pressure was a little low MD visit.   Plan Will plan home visit in the next week and will provide blood pressure monitor and education .    [name], RN, [place] Management Coordinator  [phone]- Mobile [phone]- Toll Free Main Office

## 2017-09-25 ENCOUNTER — Other Ambulatory Visit: Payer: Self-pay | Admitting: *Deleted

## 2017-09-25 NOTE — Patient Outreach (Signed)
[facility] [facility]) Care Management   09/25/2017  [name] 1940/11/16 [id number]  [name] is an 76 y.o. male  76 year old male with recent hospital admission, discharged to home on  Dx: AKI, UTI, urinary retention foley catheter in place, Kyphoplasty 11/1. 11/21 Cystoscopy with stent placement procedure     Subjective:  Patient discussed he is getting his strength back.  Objective:  BP 112/64 (BP Location: Left Arm, Patient Position: Sitting, Cuff Size: Large)   Pulse 67   Resp 18   SpO2 97%  Review of Systems  Constitutional: Negative.   HENT: Negative.        Hoarse voice   Eyes: Negative.   Respiratory: Positive for cough. Negative for shortness of breath.   Cardiovascular: Negative.   Gastrointestinal: Negative.   Genitourinary:       Foley with yellow urine   Musculoskeletal: Negative.   Skin: Negative.   Neurological: Negative.   Endo/Heme/Allergies: Negative.   Psychiatric/Behavioral: Negative.     Physical Exam  Constitutional: He is oriented to person, place, and time. He appears well-developed and well-nourished.  Cardiovascular: Normal rate, normal heart sounds and intact distal pulses.  Respiratory: Effort normal and breath sounds normal.  GI: Soft.  Neurological: He is alert and oriented to person, place, and time.  Skin: Skin is warm and dry.  Psychiatric: He has a normal mood and affect. His behavior is normal. Judgment and thought content normal.    Encounter Medications:   Outpatient Encounter Medications as of 09/25/2017  Medication Sig Note  . acetaminophen (TYLENOL) 500 MG tablet Take 1,000 mg every 6 (six) hours as needed by mouth for moderate pain or headache.   Marland Kitchen aspirin EC 81 MG EC tablet Take 1 tablet (81 mg total) by mouth daily.   Marland Kitchen atenolol (TENORMIN) 50 MG tablet Take 1 tablet (50 mg total) by mouth daily.   Marland Kitchen atorvastatin (LIPITOR) 40 MG tablet Take 1 tablet (40 mg total) daily at 6 PM by mouth.   .  cephALEXin (KEFLEX) 500 MG capsule Take 1 capsule (500 mg total) 3 (three) times daily by mouth. (Patient not taking: Reported on 09/12/2017) 08/27/2017: Not started yet  . clopidogrel (PLAVIX) 75 MG tablet Take 1 tablet (75 mg total) by mouth daily.   Marland Kitchen docusate sodium (COLACE) 100 MG capsule Take 1 capsule (100 mg total) by mouth 2 (two) times daily.   . fluconazole (DIFLUCAN) 100 MG tablet Take 1 tablet (100 mg total) by mouth daily.   Marland Kitchen HYDROcodone-acetaminophen (NORCO/VICODIN) 5-325 MG tablet Take 1-2 tablets by mouth every 6 (six) hours as needed for moderate pain.   Marland Kitchen insulin NPH Human (HUMULIN N,NOVOLIN N) 100 UNIT/ML injection Inject 0.25 mLs (25 Units total) into the skin 2 (two) times daily.   . insulin regular (NOVOLIN R,HUMULIN R) 100 units/mL injection Inject 10-15 Units See admin instructions into the skin. Inject 15 units SQ in the morning and inject 10 units SQ with supper   . levothyroxine (SYNTHROID, LEVOTHROID) 300 MCG tablet Take 300 mcg by mouth daily before breakfast.   . polyethylene glycol (MIRALAX / GLYCOLAX) packet Take 17 g by mouth daily as needed for mild constipation.   . sodium bicarbonate 650 MG tablet Take 650 mg 2 (two) times daily by mouth.    . tamsulosin (FLOMAX) 0.4 MG CAPS capsule Take 1 capsule (0.4 mg total) by mouth daily after breakfast.    Facility-Administered Encounter Medications as of 09/25/2017  Medication  .  ciprofloxacin (CIPRO) tablet 500 mg  . lidocaine (XYLOCAINE) 2 % jelly 1 application    Functional Status:   In your present state of health, do you have any difficulty performing the following activities: 08/29/2017 08/25/2017  Hearing? - N  Vision? - N  Difficulty concentrating or making decisions? - N  Walking or climbing stairs? N Y  Comment - using walker   Dressing or bathing? - Y  Comment - wife helps   Doing errands, shopping? Y Y  Comment - family helps   Preparing Food and eating ? - Y  Comment - wife helps with meal prep   Using the Toilet? - N  In the past six months, have you accidently leaked urine? - N  Do you have problems with loss of bowel control? - N  Managing your Medications? - Y  Comment - family helps  Managing your Finances? - N  Housekeeping or managing your Housekeeping? - Y  Comment - wife helps   Some recent data might be hidden    Fall/Depression Screening:    Fall Risk  08/20/2017 07/31/2017 06/20/2017  Falls in the past year? Yes Yes No  Number falls in past yr: 1 1 -  Injury with Fall? Yes Yes -  Risk Factor Category  High Fall Risk High Fall Risk -  Risk for fall due to : History of fall(s);Impaired balance/gait History of fall(s);Impaired balance/gait;Impaired mobility Impaired balance/gait  Follow up Falls prevention discussed Falls evaluation completed;Falls prevention discussed;Education provided -   PHQ 2/9 Scores 09/10/2017 07/31/2017 06/20/2017 07/22/2016 03/19/2016 03/16/2015  PHQ - 2 Score 0 0 0 0 0 0    Assessment:  Routine home visit, wife present she reports that she continues to fill patient pill organizer per most recent printed AVS..   Recent Kyphoplasty - ambulating independently ,no falls, denies dizziness, completing shower independently  denies back discomfort has started back driving,. Discussed safety precautions and allowing family assistance as needed. Reports no further follow up with Dr.Menz.   Urinary Retention - foley in place, continues with foley care maintenance. Has follow up appointment/testing in next month. Patient only had 30 day supply of Flomax at discharge prescription filled 11/2, with no refills on prescription, no flomax in past 2 weeks per report.  Recent history of Rt Carotid Endarectectomy- incision healed, tolerating diet, voice remains hoarse, patient reports some  improvement. Has follow up appointment with Dr.Dew 12/14 and ENT in January . Patient to discuss with Dr.Dew at visit when to resume Plavix. Diabetes - 30 day average 152 for 6  occurrences. Reports taking insulin daily, reports one episode of hypoglycemia of blood sugar 65, treatment and blood sugar recheck 150, review with teachback, Needs continued reinforcement of monitoring ., History of Hypertension - reported recent low blood sugar readings at follow up visits,denies symptoms of dizziness, weakness,  patient request to be able to monitor blood pressure at home.   Patient has completed 31 days of transition of care program, will continue to follow for complex care management program.   Plan:  Placed call to urology office of Dr.Budzyn able to leave a message on nurse regarding question of whether patient needs to continue and if so he needs refill. Provide blood pressure monitor and education.  Will send visit note for quarterly update to PCP . Will follow up with patient by telephone in the next 2 weeks.  [facility] CM Care Plan Problem One     Most Recent Value  Care Plan Problem  One  Patient with recent hospital admission related to Acute Kidney injury, Pneumonia, UTI and urine retention   Role Documenting the Problem One  Care Management Coordinator  Care Plan for Problem One  Active  [facility] Long Term Goal   Patient will not experience a hospital admission in the next 60  days  [goal revised ]  [facility] Long Term Goal Start Date  08/20/17 Barrie Folk restarted due to readmission ]  Interventions for Problem One Long Term Goal  Reviewed continuing adherence with MD instructions, keep all medical appointment reviewed return call to MD of symptoms of dizziness, concerns related to foley ,   [facility] CM Short Term Goal #1   Patient will attend all medical appointments in the next 30 days   [facility] CM Short Term Goal #1 Start Date  08/20/17  [facility] CM Short Term Goal #1 Met Date  09/26/17  [facility] CM Short Term Goal #2   Patient will not experince a fall in the next 30 days   [facility] CM Short Term Goal #2 Start Date  08/20/17  [facility] CM Short Term Goal #2 Met Date  09/25/17  [facility] CM Short Term Goal #3   Patient will report increased improvement in walking and activity tolerance as evidenced by report in the next 30 days   [facility] CM Short Term Goal #3 Start Date  08/25/17  [facility] CM Short Term Goal #3 Met Date  09/26/17  Interventions for Short Tern Goal #3  Reviewed  current progress and reviewed safety precautions of using walker, not driving if having dizziness or weakness.   [facility] CM Short Term Goal #4  Patient will report checking blood sugar daily in the next 30 days   [facility] CM Short Term Goal #4 Start Date  09/26/17  Interventions for Short Term Goal #4  Advised regarding importance of monitorng blood sugar while taking insulin, teachback of hypoglycemia treatment   [facility] CM Short Term Goal #5   Patient will report monitoring blood pressure at least 3 days a week in the next 30 days   [facility] CM Short Term Goal #5 Start Date  09/25/17  Interventions for Short Term Goal #5  Provided blood pressure monitor and demonstration and teachback return demonstration , provided [facility] calendar and instructed how to document readings, reviewed normal reading range, reviewed symptoms of low blood pressure, and encouraged to notify MD of symptoms .         [name], RN, [place] Management Coordinator  [phone]- Mobile [phone]- Toll Free Main Office

## 2017-09-26 ENCOUNTER — Ambulatory Visit (INDEPENDENT_AMBULATORY_CARE_PROVIDER_SITE_OTHER): Payer: PPO | Admitting: Vascular Surgery

## 2017-09-26 ENCOUNTER — Encounter (INDEPENDENT_AMBULATORY_CARE_PROVIDER_SITE_OTHER): Payer: Self-pay | Admitting: Vascular Surgery

## 2017-09-26 ENCOUNTER — Encounter (INDEPENDENT_AMBULATORY_CARE_PROVIDER_SITE_OTHER): Payer: Self-pay

## 2017-09-26 ENCOUNTER — Ambulatory Visit (INDEPENDENT_AMBULATORY_CARE_PROVIDER_SITE_OTHER): Payer: PPO

## 2017-09-26 VITALS — BP 118/83 | HR 63 | Resp 17 | Ht 75.0 in | Wt 210.0 lb

## 2017-09-26 DIAGNOSIS — N184 Chronic kidney disease, stage 4 (severe): Secondary | ICD-10-CM

## 2017-09-26 DIAGNOSIS — I63239 Cerebral infarction due to unspecified occlusion or stenosis of unspecified carotid arteries: Secondary | ICD-10-CM

## 2017-09-26 DIAGNOSIS — I129 Hypertensive chronic kidney disease with stage 1 through stage 4 chronic kidney disease, or unspecified chronic kidney disease: Secondary | ICD-10-CM

## 2017-09-26 DIAGNOSIS — I639 Cerebral infarction, unspecified: Secondary | ICD-10-CM

## 2017-09-26 DIAGNOSIS — E1122 Type 2 diabetes mellitus with diabetic chronic kidney disease: Secondary | ICD-10-CM

## 2017-09-26 DIAGNOSIS — I6523 Occlusion and stenosis of bilateral carotid arteries: Secondary | ICD-10-CM

## 2017-09-26 DIAGNOSIS — E1142 Type 2 diabetes mellitus with diabetic polyneuropathy: Secondary | ICD-10-CM

## 2017-09-26 NOTE — Assessment & Plan Note (Signed)
His carotid duplex today reveals a widely patent right carotid endarterectomy with a known left carotid artery occlusion.  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  We will recheck his carotid arteries in 6 months with a duplex.  He will resume Plavix.  He is going to follow-up with ENT next month.

## 2017-09-26 NOTE — Assessment & Plan Note (Signed)
blood glucose control important in reducing the progression of atherosclerotic disease. Also, involved in wound healing. On appropriate medications.  

## 2017-09-26 NOTE — Progress Notes (Signed)
Patient ID: [name], male   DOB: 29-Sep-1941, 76 y.o.   MRN: [id number]  Chief Complaint  Patient presents with  . Carotid    1 month f/u    HPI [name] is a 76 y.o. male.  Patient returns in follow-up of his carotid disease.  He is about 2 months status post right carotid endarterectomy.  His hoarseness has significantly improved but has not yet resolved.  He has seen ENT and will follow up with him as well.  He continues to have his Foley catheter after his urologic issues and sees Dr. Erlene Quan for this. His carotid duplex today reveals a widely patent right carotid endarterectomy with a known left carotid artery occlusion.   Past Medical History:  Diagnosis Date  . Blood transfusion without reported diagnosis   . 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 Complication of anesthesia    raspy voice since carotid endarterectomy 07/17/17  . Coronary artery disease   . Diabetes mellitus without complication ([place])   . Dysrhythmia   . Hyperlipidemia   . Hypertension   . Myocardial infarction ([place]) 1997  . Peripheral vascular disease ([place])   . Pneumonia 2014  . Stroke ([place]) 05/2017  . Thyroid disease     Past Surgical History:  Procedure Laterality Date  . APPENDECTOMY  2011  . BACK SURGERY    . CARDIAC CATHETERIZATION    . CAROTID ANGIOGRAPHY Right 06/13/2017   Procedure: Right subclavian and Carotid Angiography, possible intervention;  Surgeon: Algernon Huxley, MD;  Location: [place] CV LAB;  Service: Cardiovascular;  Laterality: Right;  . CHOLECYSTECTOMY N/A 04/23/2016   Procedure: LAPAROSCOPIC CHOLECYSTECTOMY;  Surgeon: [name], MD;  Location: [facility] ORS;  Service: General;  Laterality: N/A;  . COLON SURGERY  2011   Colectomy for ileo-cecal valve cancer  . CORONARY ARTERY BYPASS GRAFT  1997   x 3  . CYSTOSCOPY W/ RETROGRADES Bilateral 09/03/2017   Procedure: CYSTOSCOPY WITH RETROGRADE PYELOGRAM;  Surgeon: Hollice Espy, MD;  Location: [facility] ORS;  Service: Urology;   Laterality: Bilateral;  . CYSTOSCOPY WITH STENT PLACEMENT Right 09/03/2017   Procedure: CYSTOSCOPY WITH STENT PLACEMENT;  Surgeon: Hollice Espy, MD;  Location: [facility] ORS;  Service: Urology;  Laterality: Right;  . ENDARTERECTOMY Right 07/17/2017   Procedure: ENDARTERECTOMY CAROTID;  Surgeon: Algernon Huxley, MD;  Location: [facility] ORS;  Service: Vascular;  Laterality: Right;  . HERNIA REPAIR  2011   Ventral hernia  . HOLMIUM LASER APPLICATION N/A 50/93/2671   Procedure: HOLMIUM LASER APPLICATION;  Surgeon: Hollice Espy, MD;  Location: [facility] ORS;  Service: Urology;  Laterality: N/A;  . KNEE SURGERY Left   . KYPHOPLASTY N/A 08/14/2017   Procedure: [id number];  Surgeon: Hessie Knows, MD;  Location: [facility] ORS;  Service: Orthopedics;  Laterality: N/A;  . PROSTATE SURGERY  2002   BPH benign pathology  . SPINE SURGERY  1989   Lumbar disc      Allergies  Allergen Reactions  . Ace Inhibitors Other (See Comments)    Reaction:  Raises potassium   . Quinapril Rash and Other (See Comments)    hyperkalemia    Current Outpatient Medications  Medication Sig Dispense Refill  . acetaminophen (TYLENOL) 500 MG tablet Take 1,000 mg every 6 (six) hours as needed by mouth for moderate pain or headache.    Marland Kitchen aspirin EC 81 MG EC tablet Take 1 tablet (81 mg total) by mouth daily. 90 tablet 3  . atenolol (TENORMIN)  50 MG tablet Take 1 tablet (50 mg total) by mouth daily. 30 tablet 5  . atorvastatin (LIPITOR) 40 MG tablet Take 1 tablet (40 mg total) daily at 6 PM by mouth. 30 tablet 5  . cephALEXin (KEFLEX) 500 MG capsule Take 1 capsule (500 mg total) 3 (three) times daily by mouth. (Patient not taking: Reported on 09/12/2017) 15 capsule 0  . clopidogrel (PLAVIX) 75 MG tablet Take 1 tablet (75 mg total) by mouth daily. (Patient not taking: Reported on 09/25/2017) 30 tablet 5  . docusate sodium (COLACE) 100 MG capsule Take 1 capsule (100 mg total) by mouth 2 (two) times daily. 10 capsule 0  . fluconazole  (DIFLUCAN) 100 MG tablet Take 1 tablet (100 mg total) by mouth daily. (Patient not taking: Reported on 09/25/2017) 10 tablet 0  . HYDROcodone-acetaminophen (NORCO/VICODIN) 5-325 MG tablet Take 1-2 tablets by mouth every 6 (six) hours as needed for moderate pain. (Patient not taking: Reported on 09/25/2017) 10 tablet 0  . insulin NPH Human (HUMULIN N,NOVOLIN N) 100 UNIT/ML injection Inject 0.25 mLs (25 Units total) into the skin 2 (two) times daily. 10 mL 11  . insulin regular (NOVOLIN R,HUMULIN R) 100 units/mL injection Inject 10-15 Units See admin instructions into the skin. Inject 15 units SQ in the morning and inject 10 units SQ with supper    . levothyroxine (SYNTHROID, LEVOTHROID) 300 MCG tablet Take 300 mcg by mouth daily before breakfast.    . polyethylene glycol (MIRALAX / GLYCOLAX) packet Take 17 g by mouth daily as needed for mild constipation. (Patient not taking: Reported on 09/25/2017) 14 each 0  . sodium bicarbonate 650 MG tablet Take 650 mg 2 (two) times daily by mouth.     . tamsulosin (FLOMAX) 0.4 MG CAPS capsule Take 1 capsule (0.4 mg total) by mouth daily after breakfast. (Patient not taking: Reported on 09/25/2017) 30 capsule 0   Current Facility-Administered Medications  Medication Dose Route Frequency Provider Last Rate Last Dose  . ciprofloxacin (CIPRO) tablet 500 mg  500 mg Oral Once Nickie Retort, MD      . 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 On appropriate oral medications.   Diabetes mellitus with  polyneuropathy blood glucose control important in reducing the progression of atherosclerotic disease. Also, involved in wound healing. On appropriate medications.   Carotid stenosis, symptomatic, with infarction [facility]) His carotid duplex today reveals a widely patent right carotid endarterectomy with a known left carotid artery occlusion.  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  We will recheck his carotid arteries in 6 months with a duplex.  He will resume Plavix.  He is going to follow-up with ENT next month.      Leotis Pain 09/26/2017, 11:28 AM   This note was created with Dragon medical transcription system.  Any errors from dictation  are unintentional.

## 2017-09-26 NOTE — Patient Instructions (Signed)
Carotid Artery Disease The carotid arteries are arteries on both sides of the neck. They carry blood to the brain. Carotid artery disease is when the arteries get smaller (narrow) or get blocked. If these arteries get smaller or get blocked, you are more likely to have a stroke or warning stroke (transient ischemic attack). Follow these instructions at home:  Take medicines as told by your doctor. Make sure you understand all your medicine instructions. Do not stop your medicines without talking to your doctor first.  Follow your doctor's diet instructions. It is important to eat a healthy diet that includes plenty of: ? Fresh fruits. ? Vegetables. ? Lean meats.  Avoid: ? High-fat foods. ? High-sodium foods. ? Foods that are fried, overly processed, or have poor nutritional value.  Stay a healthy weight.  Stay active. Get at least 30 minutes of activity every day.  Do not smoke.  Limit alcohol use to: ? No more than 2 drinks a day for men. ? No more than 1 drink a day for women who are not pregnant.  Do not use illegal drugs.  Keep all doctor visits as told. Get help right away if:  You have sudden weakness or loss of feeling (numbness) on one side of the body, such as the face, arm, or leg.  You have sudden confusion.  You have trouble speaking (aphasia) or understanding.  You have sudden trouble seeing out of one or both eyes.  You have sudden trouble walking.  You have dizziness or feel like you might pass out (faint).  You have a loss of balance or your movements are not steady (uncoordinated).  You have a sudden, severe headache with no known cause.  You have trouble swallowing (dysphagia). Call your local emergency services (911 in [place] Do notdrive yourself to the clinic or hospital. This information is not intended to replace advice given to you by your health care provider. Make sure you discuss any questions you have with your health care  provider. Document Released: 09/16/2012 Document Revised: 03/07/2016 Document Reviewed: 03/31/2013 Elsevier Interactive Patient Education  2018 Elsevier Inc.  

## 2017-09-26 NOTE — Assessment & Plan Note (Signed)
blood pressure control important in reducing the progression of atherosclerotic disease. On appropriate oral medications.  

## 2017-10-08 ENCOUNTER — Other Ambulatory Visit: Payer: Self-pay | Admitting: *Deleted

## 2017-10-08 ENCOUNTER — Other Ambulatory Visit: Payer: Self-pay

## 2017-10-08 MED ORDER — TAMSULOSIN HCL 0.4 MG PO CAPS: 0.4000 mg | ORAL_CAPSULE | Freq: Every day | ORAL | 3 refills | Status: DC

## 2017-10-08 NOTE — Patient Outreach (Signed)
[facility]) Care Management  10/08/2017  [name] 06/18/1941 [id number]   Telephone follow up call   76 year old male with recent hospital admission, discharged to home onDx: AKI, UTI, urinary retention foley catheter in place, Kyphoplasty11/1. 11/21 Cystoscopy with stent placement procedure . PMHx significant for Diabetes, hypertension, carotid stenosis, right carotid endarterectomy.  Successful telephone outreach to patient ,HIPAA information verified. Patient reports he believes his voice is getting a little stronger.  Patient further discussed.   Diabetes Reports recent hypoglycemia episode, unable to state result at that time,he reports treating with 3 to 4 glucose tablets , eating a sandwich and recheck of blood sugar was above 70. Patient reports that appetite intake have been pretty good.  Recent R CEA Discussed recent visit with Dr.Dew reports a good report and next visit in 3 months. Patient denies any symptoms of dizziness, weakness, he continues to tolerating ambulation in the home. Patient has restarted back on Plavix.  Hypertension Monitoring blood pressure daily, recent reading range, 115- 150's/60-70.  Urology  Patient remains with foley catheter in place, reports urine is clear, denies concerns. Patient discussed he still does not have a refill on Flomax.   Plan Will place return call to Urology office of Dr. Pilar Jarvis regarding need for Flomax refill ., spoke with triage nurse that address, updated patient.  Will follow up by telephone in the next week, for 60 day  follow up and will assess for continued complex care management needs.    [name], RN, [place] Management Coordinator  [phone]- Mobile [phone]- Toll Free Main Office

## 2017-10-14 DIAGNOSIS — R634 Abnormal weight loss: Secondary | ICD-10-CM

## 2017-10-14 DIAGNOSIS — N2889 Other specified disorders of kidney and ureter: Secondary | ICD-10-CM

## 2017-10-14 HISTORY — DX: Abnormal weight loss: R63.4

## 2017-10-14 HISTORY — DX: Other specified disorders of kidney and ureter: N28.89

## 2017-10-17 ENCOUNTER — Telehealth: Payer: Self-pay | Admitting: Internal Medicine

## 2017-10-17 NOTE — Telephone Encounter (Signed)
Called pt to sched for AWV with Nurse Health Advisor.  C/b #  [phone] on Skype [email or web address] if you have questions ° °

## 2017-10-20 ENCOUNTER — Other Ambulatory Visit: Payer: Self-pay | Admitting: *Deleted

## 2017-10-20 DIAGNOSIS — N184 Chronic kidney disease, stage 4 (severe): Secondary | ICD-10-CM | POA: Diagnosis not present

## 2017-10-20 NOTE — Patient Outreach (Addendum)
[facility]) Care Management  10/20/2017  YIGIT NORKUS [birth year] [id number]  60 day transition of care call  77 year old male with recent hospital admission, discharged to home onDx: AKI, UTI, urinary retention foley catheter in place, Kyphoplasty11/1. 11/21 Cystoscopy with stent placement procedure .  PMHx significant for Diabetes, hypertension, carotid stenosis, right carotid endarterectomy, CVA,CKD.  Successful outreach call to patient home able to speak with his wife, patient on speaker phone, HIPAA identifiers obtained. Wife discussed patient is doing fairly well. Complaint of having a headache on yesterday but denies headache on today.   Diabetes.  Patient reports continues to check blood sugars daily, reports recent readings in the 140 160. Reports low blood sugar readings once weekly for the past 2 weeks down to 69.  Discussed appetite up and down, reports taking insulin as prescribed.Discussed nutrition supplement drink such as Glucerna, patient will think about it  Patient reports unsure of weight loss not weighing.He keeps glucose tablets with him/wife at all times, treats low blood sugar events with glucose tablet or food snacks if at home review hypoglycemia protocol, reinforced rechecking blood sugar.   Urinary Retention Continues with foley catheter in place, yellow urine, review daily foley care, wife reports occasionally foley dragging on floor when walking because he forgets he has it, patient described having foley as an annoyance. Discussed follow up urology test in [place] on 1/9 and then office visit with Dr. Pilar Jarvis for results on 1/11. Has refilled and taking Flomax as prescribed.   Recent right CEA, hoarseness Reports slight improvement in hoarseness , has appointment with ENT, Dr.McQueen on 1/10. Denies difficulty swallowing. Tolerating improved ambulation in home,using cane at times.Tolerating driving short distances, and using motorized cart  in store when shopping. Denies any stroke symptoms,denies dizziness or weakness increase.  Reports back to taking Plavix after recent visit with Dr.Dew.  Hypertension Reports continuing to check blood pressure discussed recent reading of 140-160/74, heart rate in the 70's.Discussed having appointment with nephrologist on today.    Patient has completed 60 days of transition of care program,reviewed current care goals, patient request continued follow up for care management  needs for chronic disease concerns of Diabetes, hypertension.  Plan Will continue to follow patient for complex care management concerns, patient care goals reviewed.  Patient/wife agreeable to scheduling home visit follow up.   THN CM Care Plan Problem One     Most Recent Value  Care Plan Problem One  At risk for readmission related to chronic disease states, hypertension , carotid disease, hypertension, diabetes, urinary retention,4 admits in the last 6 months.   Role Documenting the Problem One  Care Management [place] for Problem One  Active  [facility]N Long Term Goal   Patient will not experience a hospital admission in the next 60  days   THN Long Term Goal Start Date  10/20/17  [facility] Long Term Goal Met Date  10/20/17  Interventions for Problem One Long Term Goal  Review of current clinical symptoms, reinforced continuing to follow prescribed medicine regimen,   THN CM Short Term Goal #1   Patient will report continuing to monitor blood pressure daily and keeping a record in the next   [facility] CM Short Term Goal #1 Start Date  10/20/17  [facility] CM Short Term Goal #1 Met Date  09/26/17  Interventions for Short Term Goal #1  Advised patient regarding importance of knowing blood pressure reading, keeping a record , advised patient regarding taking  record to MD visit today and discuss when to call parameter   THN CM Short Term Goal #2   Patient will report continuing to monitor blood sugars daily, keeping a record in the  next 30 days   THN CM Short Term Goal #2 Start Date  10/20/17  [facility] CM Short Term Goal #2 Met Date  09/25/17  Interventions for Short Term Goal #2  Reviewed recent readings, reinforced importance of monitoring blood sugar in relation to taking insulin twice daily.   THN CM Short Term Goal #3  Patient will report increased improvement in walking and activity tolerance as evidenced by report in the next 30 days   THN CM Short Term Goal #3 Start Date  08/25/17  [facility] CM Short Term Goal #3 Met Date  09/26/17  Interventions for Short Tern Goal #3  Reviewed  current progress and reviewed safety precautions of using walker, not driving if having dizziness or weakness.   THN CM Short Term Goal #4  Patient will report checking blood sugar daily in the next 30 days   THN CM Short Term Goal #4 Start Date  09/26/17  Interventions for Short Term Goal #4  Reviewed recent reading and teachback on low blood sugar managment , reinforced noftiyfing MD of increase in hypoglycemic episodes   THN CM Short Term Goal #5   Patient will report monitoring blood pressure at least 3 days a week in the next 30 days   THN CM Short Term Goal #5 Start Date  09/25/17  Interventions for Short Term Goal #5  REviewed recent readings and teachback of technique       [name], RN, [place] [facility] Management Coordinator  [phone]- Mobile [phone]- [place]

## 2017-10-22 DIAGNOSIS — R3914 Feeling of incomplete bladder emptying: Secondary | ICD-10-CM | POA: Diagnosis not present

## 2017-10-23 ENCOUNTER — Other Ambulatory Visit: Payer: Self-pay | Admitting: Urology

## 2017-10-23 DIAGNOSIS — J3801 Paralysis of vocal cords and larynx, unilateral: Secondary | ICD-10-CM | POA: Diagnosis not present

## 2017-10-23 DIAGNOSIS — R490 Dysphonia: Secondary | ICD-10-CM | POA: Diagnosis not present

## 2017-10-24 ENCOUNTER — Ambulatory Visit: Payer: PPO | Admitting: Urology

## 2017-10-24 ENCOUNTER — Encounter: Payer: Self-pay | Admitting: Urology

## 2017-10-24 VITALS — BP 100/62 | HR 73 | Ht 75.0 in | Wt 214.5 lb

## 2017-10-24 DIAGNOSIS — R339 Retention of urine, unspecified: Secondary | ICD-10-CM

## 2017-10-24 DIAGNOSIS — N2889 Other specified disorders of kidney and ureter: Secondary | ICD-10-CM | POA: Diagnosis not present

## 2017-10-24 NOTE — Progress Notes (Signed)
10/24/2017 11:11 AM   [name] [birth year] [id number]  Referring provider: Glean Hess, MD [address] [place], [place] [address]  Chief Complaint  Patient presents with  . Follow-up    UDS results    HPI: 77 year old white male to discus UDS results for urinary retention.  He does have a history of simple prostatectomy and his prostatic fossa was wide open at the time of recent ureteroscopy.  He was started on antifungals as a precaution.   UDS Results: Maximum capacity 381 cc First sensation 368 cc Max unstable detrusor contracture 9 cm of water First unstable contraction occurred at 381 cc Maximum flow rate was 12 cc/s Detrusor pressure max flow rate was 13 cm of water Max detrusor pressure of 14 cm of water PVR 65 cc The patient did technically passive UDS trial of void his detrusor pressures were very weak.  Background history:  He underwent a right carotid endarterectomy at the beginning of theOctober 2018and then fell resulting in a fracture of T12 a few weeks ago. Over the month he has had progressive weak stream and hesitancy and feeling of incomplete emptying. He underwent CT scan which showed a distended bladder and small bilateral bladder diverticula and a small prostate. He also had acute kidney injury which looked to be prerenal. Baseline creatinine around 2.8 or 3. Patient reports what sounds like an open simple prostatectomy in the mid 90s which is likely why the prostate looks small on the CT yet he has diverticula. A Foley catheter was placed and he is made good urine now that he has been resuscitated. He is on tamsulosin.  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 Patient recalls gross hematuria after starting Plavix following the carotid endarterectomy earlier this month. Urine was red. He has not had clots. Urine is light pink currently.  He presents today for a trial of void also to discuss right  ureteroscopy for further evaluation of the right renal pelvic mass.  This turned out to be a stone versus fungal ball. It was not a malignancy.  Stone analysis appeared consistent with blood material with a few crystals too small to identify.     PMH: Past Medical History:  Diagnosis Date  . Blood transfusion without reported diagnosis   . 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 Complication of anesthesia    raspy voice since carotid endarterectomy 07/17/17  . Coronary artery disease   . Diabetes mellitus without complication ([place])   . Dysrhythmia   . Hyperlipidemia   . Hypertension   . Myocardial infarction ([place]) 1997  . Peripheral vascular disease ([place])   . Pneumonia 2014  . Stroke ([place]) 05/2017  . Thyroid disease     Surgical History: Past Surgical History:  Procedure Laterality Date  . APPENDECTOMY  2011  . BACK SURGERY    . CARDIAC CATHETERIZATION    . CAROTID ANGIOGRAPHY Right 06/13/2017   Procedure: Right subclavian and Carotid Angiography, possible intervention;  Surgeon: Algernon Huxley, MD;  Location: [facility];  Service: Cardiovascular;  Laterality: Right;  . CHOLECYSTECTOMY N/A 04/23/2016   Procedure: LAPAROSCOPIC CHOLECYSTECTOMY;  Surgeon: [name], MD;  Location: [facility] OR[facility];  Service: General;  Laterality: N/A;  . COLON SURGERY  2011   Colectomy for ileo-cecal valve cancer  . CORONARY ARTERY BYPASS GRAFT  1997   x 3  . CYSTOSCOPY W/ RETROGRADES Bilateral 09/03/2017   Procedure: CYSTOSCOPY  WITH RETROGRADE PYELOGRAM;  Surgeon: Hollice Espy, MD;  Location: [facility] OR[facility];  Service: Urology;  Laterality: Bilateral;  . CYSTOSCOPY WITH STENT PLACEMENT Right 09/03/2017   Procedure: CYSTOSCOPY WITH STENT PLACEMENT;  Surgeon: Hollice Espy, MD;  Location: [facility];  Service: Urology;  Laterality: Right;  . ENDARTERECTOMY Right 07/17/2017   Procedure: ENDARTERECTOMY CAROTID;  Surgeon: Algernon Huxley, MD;  Location: [facility];  Service: Vascular;  Laterality: Right;  . HERNIA REPAIR   2011   Ventral hernia  . HOLMIUM LASER APPLICATION N/A 69/62/9528   Procedure: HOLMIUM LASER APPLICATION;  Surgeon: Hollice Espy, MD;  Location: [facility] OR[facility];  Service: Urology;  Laterality: N/A;  . KNEE SURGERY Left   . KYPHOPLASTY N/A 08/14/2017   Procedure: [id number];  Surgeon: Hessie Knows, MD;  Location: [facility] OR[facility];  Service: Orthopedics;  Laterality: N/A;  . PROSTATE SURGERY  2002   BPH benign pathology  . SPINE SURGERY  1989   Lumbar disc    Home Medications:  Allergies as of 10/24/2017      Reactions   Ace Inhibitors Other (See Comments)   Reaction:  Raises potassium    Quinapril Rash, Other (See Comments)   hyperkalemia      Medication List        Accurate as of 10/24/17 11:11 AM. Always use your most recent med list.          acetaminophen 500 MG tablet Commonly known as:  TYLENOL Take 1,000 mg every 6 (six) hours as needed by mouth for moderate pain or headache.   aspirin 81 MG EC tablet Take 1 tablet (81 mg total) by mouth daily.   atenolol 50 MG tablet Commonly known as:  TENORMIN Take 1 tablet (50 mg total) by mouth daily.   atorvastatin 40 MG tablet Commonly known as:  LIPITOR Take 1 tablet (40 mg total) daily at 6 PM by mouth.   clopidogrel 75 MG tablet Commonly known as:  PLAVIX Take 1 tablet (75 mg total) by mouth daily.   docusate sodium 100 MG capsule Commonly known as:  COLACE Take 1 capsule (100 mg total) by mouth 2 (two) times daily.   HYDROcodone-acetaminophen 5-325 MG tablet Commonly known as:  NORCO/VICODIN Take 1-2 tablets by mouth every 6 (six) hours as needed for moderate pain.   insulin NPH Human 100 UNIT/ML injection Commonly known as:  HUMULIN N,NOVOLIN N Inject 0.25 mLs (25 Units total) into the skin 2 (two) times daily.   insulin regular 100 units/mL injection Commonly known as:  NOVOLIN R,HUMULIN R Inject 10-15 Units See admin instructions into the skin. Inject 15 units SQ in the morning and inject 10 units SQ with  supper   levothyroxine 300 MCG tablet Commonly known as:  SYNTHROID, LEVOTHROID Take 300 mcg by mouth daily before breakfast.   polyethylene glycol packet Commonly known as:  MIRALAX / GLYCOLAX Take 17 g by mouth daily as needed for mild constipation.   sodium bicarbonate 650 MG tablet Take 650 mg 2 (two) times daily by mouth.   tamsulosin 0.4 MG Caps capsule Commonly known as:  FLOMAX Take 1 capsule (0.4 mg total) by mouth daily after breakfast.   tamsulosin 0.4 MG Caps capsule Commonly known as:  FLOMAX Take 1 capsule (0.4 mg total) by mouth daily.       Allergies:  Allergies  Allergen Reactions  . Ace Inhibitors Other (See Comments)    Reaction:  Raises potassium   . Quinapril Rash and Other (See Comments)  hyperkalemia    Family History: Family History  Problem Relation Age of Onset  . Cancer Mother        Lung  . Diabetes Mother   . Heart disease Mother   . Heart disease Father     Social History:  reports that he quit smoking about 21 years ago. His smoking use included cigarettes. he has never used smokeless tobacco. He reports that he does not drink alcohol or use drugs.  ROS: UROLOGY Frequent Urination?: No Hard to postpone urination?: No Burning/pain with urination?: No Get up at night to urinate?: No Leakage of urine?: No Urine stream starts and stops?: No Trouble starting stream?: No Do you have to strain to urinate?: No Blood in urine?: No Urinary tract infection?: No Sexually transmitted disease?: No Injury to kidneys or bladder?: No Painful intercourse?: No Weak stream?: No Erection problems?: No Penile pain?: No  Gastrointestinal Nausea?: No Vomiting?: No Indigestion/heartburn?: No Diarrhea?: No Constipation?: No  Constitutional Fever: No Night sweats?: No Weight loss?: No Fatigue?: No  Skin Skin rash/lesions?: No Itching?: No  Eyes Blurred vision?: No Double vision?: No  Ears/Nose/Throat Sore throat?: No Sinus  problems?: No  Hematologic/Lymphatic Swollen glands?: No Easy bruising?: No  Cardiovascular Leg swelling?: No Chest pain?: No  Respiratory Cough?: No Shortness of breath?: No  Endocrine Excessive thirst?: No  Musculoskeletal Back pain?: No Joint pain?: No  Neurological Headaches?: No Dizziness?: No  Psychologic Depression?: No  Physical Exam: BP 100/62 (BP Location: Right Arm, Patient Position: Sitting, Cuff Size: Normal)   Pulse 73   Ht 6\' 3"  (1.905 m)   Wt 214 lb 8 oz (97.3 kg)   BMI 26.81 kg/m   Constitutional:  Alert and oriented, No acute distress. HEENT: [place] AT, moist mucus membranes.  Trachea midline, no masses. Cardiovascular: No clubbing, cyanosis, or edema. Respiratory: Normal respiratory effort, no increased work of breathing. GI: Abdomen is soft, nontender, nondistended, no abdominal masses GU: No CVA tenderness.  Skin: No rashes, bruises or suspicious lesions. Lymph: No cervical or inguinal adenopathy. Neurologic: Grossly intact, no focal deficits, moving all 4 extremities. Psychiatric: Normal mood and affect.  Laboratory Data: Lab Results  Component Value Date   WBC 5.8 08/13/2017   HGB 12.8 (L) 08/13/2017   HCT 39.1 (L) 08/13/2017   MCV 91.5 08/13/2017   PLT 130 (L) 08/13/2017    Lab Results  Component Value Date   CREATININE 2.61 (H) 08/15/2017    Lab Results  Component Value Date   PSA <0.1 05/16/2014    No results found for: TESTOSTERONE  Lab Results  Component Value Date   HGBA1C 7.1 (H) 08/11/2017    Urinalysis    Component Value Date/Time   COLORURINE YELLOW (A) 08/10/2017 2155   APPEARANCEUR TURBID (A) 08/10/2017 2155   LABSPEC 1.010 08/10/2017 2155   PHURINE 5.0 08/10/2017 2155   GLUCOSEU >=500 (A) 08/10/2017 2155   HGBUR MODERATE (A) 08/10/2017 2155   BILIRUBINUR NEGATIVE 08/10/2017 2155   [place] NEGATIVE 08/10/2017 2155   PROTEINUR 100 (A) 08/10/2017 2155   NITRITE NEGATIVE 08/10/2017 2155   LEUKOCYTESUR  LARGE (A) 08/10/2017 2155    Assessment & Plan:    1.  Neurogenic bladder I did discuss the patient that technically he did pass his voiding trial during his UDS studies.  However, his bladder is very weak consistent with a neurogenic bladder.  I am concerned he will develop retention if you remove his Foley catheter.  We discussed options including CIC.  We will remove his catheter today.  He will institute CIC twice per day.  He will void prior to catheterizing himself twice per day.  He will record outputs from this.  He will follow-up in 1 month to assess his progress.  2.  History of fungal ball versus stone Patient still needs a renal ultrasound to rule out iatrogenic hydronephrosis.  We will have him undergo this prior to follow-up.  Return in about 4 weeks (around 11/21/2017) for renal u/s prior.  Nickie Retort, MD  [facility] [address] [place], [place] [address] [phone]

## 2017-10-29 DIAGNOSIS — N184 Chronic kidney disease, stage 4 (severe): Secondary | ICD-10-CM | POA: Diagnosis not present

## 2017-10-31 ENCOUNTER — Other Ambulatory Visit: Payer: Self-pay | Admitting: *Deleted

## 2017-10-31 NOTE — Patient Outreach (Signed)
[facility]) Care Management   10/31/2017  [name] [birth year] [id number]  [name] is an 77 y.o. male   77 year old male with recent hospital admission, discharged to home onDx: AKI, UTI, urinary retention foley catheter in place, Kyphoplasty11/1. 11/21 Cystoscopy with stent placement procedure.  PMHx significant for Diabetes, hypertension, carotid stenosis, right carotid endarterectomy, CVA,CKD.   Subjective:  Patient reports beginning to start to feel better. Still bothered by hoarseness since recent carotid surgery, but doesn't believe he wants to have further surgery for problem.  Patient discussed recent follow up visit with urology and foley catheter being removed . Patient also discussed recent appointment nephrologist , and awaiting results of lab work drawn  on this week.  Objective:  BP 138/72 (BP Location: Left Arm, Patient Position: Sitting, Cuff Size: Large)   Pulse 70   Resp 18   SpO2 98%  Review of Systems  Constitutional: Negative.   HENT: Negative.        Voice hoarseness   Eyes: Negative.   Respiratory: Positive for cough.        Clear sputum at times   Cardiovascular: Negative.   Gastrointestinal: Negative.   Genitourinary: Negative.        Self catheterization    Musculoskeletal: Negative.   Skin: Negative.   Neurological: Negative.   Endo/Heme/Allergies: Negative.   Psychiatric/Behavioral: Negative.     Physical Exam  Constitutional: He is oriented to person, place, and time. He appears well-developed and well-nourished.  Cardiovascular: Normal rate and normal heart sounds.  Respiratory: Effort normal and breath sounds normal.  GI: Soft. Bowel sounds are normal.  Neurological: He is alert and oriented to person, place, and time.  Skin: Skin is warm and dry.  Psychiatric: He has a normal mood and affect. His behavior is normal. Judgment and thought content normal.    Encounter Medications:   Outpatient  Encounter Medications as of 10/31/2017  Medication Sig Note  . acetaminophen (TYLENOL) 500 MG tablet Take 1,000 mg every 6 (six) hours as needed by mouth for moderate pain or headache.   Marland Kitchen aspirin EC 81 MG EC tablet Take 1 tablet (81 mg total) by mouth daily.   Marland Kitchen atenolol (TENORMIN) 50 MG tablet Take 1 tablet (50 mg total) by mouth daily.   Marland Kitchen atorvastatin (LIPITOR) 40 MG tablet Take 1 tablet (40 mg total) daily at 6 PM by mouth.   . clopidogrel (PLAVIX) 75 MG tablet Take 1 tablet (75 mg total) by mouth daily.   . insulin NPH Human (HUMULIN N,NOVOLIN N) 100 UNIT/ML injection Inject 0.25 mLs (25 Units total) into the skin 2 (two) times daily.   . insulin regular (NOVOLIN R,HUMULIN R) 100 units/mL injection Inject 10-15 Units See admin instructions into the skin. Inject 15 units SQ in the morning and inject 10 units SQ with supper 10/31/2017: Reports taking 12 units   . levothyroxine (SYNTHROID, LEVOTHROID) 300 MCG tablet Take 300 mcg by mouth daily before breakfast.   . sodium bicarbonate 650 MG tablet Take 650 mg 2 (two) times daily by mouth.    . tamsulosin (FLOMAX) 0.4 MG CAPS capsule Take 1 capsule (0.4 mg total) by mouth daily.   Marland Kitchen docusate sodium (COLACE) 100 MG capsule Take 1 capsule (100 mg total) by mouth 2 (two) times daily. (Patient not taking: Reported on 10/31/2017)   . HYDROcodone-acetaminophen (NORCO/VICODIN) 5-325 MG tablet Take 1-2 tablets by mouth every 6 (six) hours as needed for moderate pain. (Patient  not taking: Reported on 10/31/2017)   . polyethylene glycol (MIRALAX / GLYCOLAX) packet Take 17 g by mouth daily as needed for mild constipation. (Patient not taking: Reported on 10/31/2017)   . tamsulosin (FLOMAX) 0.4 MG CAPS capsule Take 1 capsule (0.4 mg total) by mouth daily after breakfast. (Patient not taking: Reported on 10/31/2017)    Facility-Administered Encounter Medications as of 10/31/2017  Medication  . ciprofloxacin (CIPRO) tablet 500 mg  . lidocaine (XYLOCAINE) 2 % jelly  1 application    Functional Status:   In your present state of health, do you have any difficulty performing the following activities: 08/29/2017 08/25/2017  Hearing? - N  Vision? - N  Difficulty concentrating or making decisions? - N  Walking or climbing stairs? N Y  Comment - using walker   Dressing or bathing? - Y  Comment - wife helps   Doing errands, shopping? Y Y  Comment - family helps   Preparing Food and eating ? - Y  Comment - wife helps with meal prep  Using the Toilet? - N  In the past six months, have you accidently leaked urine? - N  Do you have problems with loss of bowel control? - N  Managing your Medications? - Y  Comment - family helps  Managing your Finances? - N  Housekeeping or managing your Housekeeping? - Y  Comment - wife helps   Some recent data might be hidden    Fall/Depression Screening:    Fall Risk  08/20/2017 07/31/2017 06/20/2017  Falls in the past year? Yes Yes No  Number falls in past yr: 1 1 -  Injury with Fall? Yes Yes -  Risk Factor Category  High Fall Risk High Fall Risk -  Risk for fall due to : History of fall(s);Impaired balance/gait History of fall(s);Impaired balance/gait;Impaired mobility Impaired balance/gait  Follow up Falls prevention discussed Falls evaluation completed;Falls prevention discussed;Education provided -   [facility] 2/9 Scores 09/10/2017 07/31/2017 06/20/2017 07/22/2016 03/19/2016 03/16/2015  PHQ - 2 Score 0 0 0 0 0 0    Assessment:  Routine home visit, patient wife present.  Urinary retention  Foley catheter removed at recent urology visit, patient is now doing daily clean intermittent catheterizations, once each evening . Patient reports being taught procedure during office visit and denies having difficulty , reports he has all supplies needed. Patient reports urinating during the day good amounts and self caths at night with less than 30 ounces obtained. Reports urine voided and obtained by catheter is clear.   Diabetes   Patient admits to having low blood sugar episodes a few weeks ago and he decreased the regular insulin from 15 to 12 units twice daily. 30 day average is 120, patient has only checked blood sugar 9 times in the last 30 days. Patient last visit with Dr.Paul endocrinologist was 05/2017, patient agreeable to calling for follow up visit and following up on vision exam for routine diabetes annual follow up.  Reinforced monitoring blood sugars and low blood sugar treatment plan   History of Stroke No new symptoms, continues with hoarseness, taking plavix without signs of bleeding. 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   Patient denies any new concerns at this time   Plan:  Will plan follow up call in the next week for progress with monitoring blood sugars.  [facility] CM Care Plan Problem One     Most Recent Value  Care Plan Problem One  At risk for readmission related to chronic disease states, hypertension , carotid disease, hypertension, diabetes, urinary retention,4 admits in the last 6 months.   Role Documenting the Problem One  Care Management [place] for Problem One  Active  [facility] Long Term Goal   Patient will not experience a hospital admission in the next 60  days   [facility] Long Term Goal Start Date  10/20/17  Interventions for Problem One Long Term Goal  Discussed current clinical status, reinforced contining to follow MD instructiion on medication , and follow up   [facility] CM Short Term Goal #1   Patient will report continuing to monitor blood pressure daily and keeping a record in the next   [facility] CM Short Term Goal #1 Start Date  10/20/17  Interventions for Short Term Goal #1  Reviewed record of blood pressure montioring , return demonstration of use of monitor   [facility] CM Short Term Goal #2   Patient will report continuing to monitor blood sugars daily, keeping a record in the next 30 days   [facility] CM  Short Term Goal #2 Start Date  10/20/17  Interventions for Short Term Goal #2  Reviewed meter , Provided EMMI handout and reviewed on when to check blood sugar, reviewed with patient needed supplies for monitoring , discussed  to identify barriers to monitoring   [facility] CM Short Term Goal #3  Patient will be able to recall steps of treating low blood sugar in the next 30 days   [facility] CM Short Term Goal #3 Start Date  10/31/17  Interventions for Short Tern Goal #3  RNCM reviewed low blood sugar symptoms, action plan of low blood sugar reading rule of 15, emphasized recheck.      [name], RN, [place] Management Coordinator  [phone]- Mobile [phone]- Toll Free Main Office

## 2017-11-06 ENCOUNTER — Other Ambulatory Visit: Payer: Self-pay | Admitting: *Deleted

## 2017-11-06 NOTE — Patient Outreach (Addendum)
[name]) Care Management  11/06/2017  [name] 1941/04/03 [id number]  Telephone follow up call   77 year old male with recent hospital admission, discharged to home onDx: AKI, UTI, urinary retention foley catheter in place, Kyphoplasty11/1. 11/21 Cystoscopy with stent placement procedure.  PMHx significant for Diabetes, hypertension, carotid stenosis, right carotid endarterectomy, CVA,CKD  Successful telephone outreach call, patient reports he is doing about the same.  Discussion follow up on   Diabetes Patient hasn't checked blood sugar since last visit, verified current meter in working order. He bought new meter to one touch strips but reports it is the wrong meter. Continued education and support on significance of monitoring blood sugars. Patient denies recent low blood sugar readings .  Urinary retention.  Continues to catheterize self daily at night usually, gets about an ounce as measured in bottle. Patient reports urinating "good amounts during the day', clear yellow urine, not keeping a record. Reports it was his understanding to catheterize  once daily.  Will clarify with Dr.Budzyn office  patient frequency for catheterizations . Patient reports he has a supply of urinary catheters.  Chronic kidney disease Patient awaiting lab  results from recent visit with Dr.Kruska. Will provide wife with contact number.   Plan Placed call to Dr.Budzyn office to clarify patient CIC instructions, able to leave a message on nurse triage line with return call information.  Will plan telephone follow up to patient  in the next week and sooner with updates.    [name], RN, [place] Management Coordinator  [phone]- Mobile [phone]- Toll Free Main Office   [facility] CM Care Plan Problem One     Most Recent Value  Care Plan Problem One  At risk for readmission related to chronic disease states, hypertension , carotid disease,  hypertension, diabetes, urinary retention,4 admits in the last 6 months.   Role Documenting the Problem One  Care Management [place] for Problem One  Active  [facility] Long Term Goal   Patient will not experience a hospital admission in the next 60  days   [facility]N Long Term Goal Start Date  10/20/17  Interventions for Problem One Long Term Goal  Reviewed current clinical state, provide patient with contact number for obtaining lab result from recent nephrology visit.   [facility] CM Short Term Goal #1   Patient will report continuing to monitor blood pressure daily and keeping a record in the next   [facility] CM Short Term Goal #1 Start Date  10/20/17  Interventions for Short Term Goal #1  Reviewed record of blood pressure montioring , return demonstration of use of monitor   [facility] CM Short Term Goal #2   Patient will report continuing to monitor blood sugars daily, keeping a record in the next 30 days   [facility] CM Short Term Goal #2 Start Date  10/20/17  Interventions for Short Term Goal #2  Discussed with patient current monitoring pattern, positive reinforcement toward working on goal   [facility] CM Short Term Goal #3  Patient will be able to recall steps of treating low blood sugar in the next 30 days   [facility] CM Short Term Goal #3 Start Date  10/31/17  Interventions for Short Tern Goal #3  RNCM reviewed low blood sugar symptoms, action plan of low blood sugar reading rule of 15, emphasized recheck.   [facility] CM Short Term Goal #4  Patient will be able to perform CIC as instructed by urology in  the next 14 days   [facility] CM Short Term Goal #4 Start Date  11/06/17  Interventions for Short Term Goal #4  RNCM placed call to urology office to discuss patient instructions on schedule for catheterizations , reviewed clean catheterization procedure with teachback. , discussed the significance of catheterizing to make sure not hold excess urine in bladder after urinating.

## 2017-11-07 ENCOUNTER — Other Ambulatory Visit: Payer: Self-pay | Admitting: *Deleted

## 2017-11-07 NOTE — Patient Outreach (Signed)
[place] [facility]) Care Management  11/07/2017  [name] Nov 18, 1940 [id number]   Telephone follow up call  Unsuccessful follow up call to patient , able to leave a HIPAA compliant message for return call.   Plan  Will plan follow up call in the next week.   [name], RN, [place] Management Coordinator  [phone]- Mobile [phone]- Toll Free Main Office

## 2017-11-10 ENCOUNTER — Other Ambulatory Visit: Payer: Self-pay | Admitting: *Deleted

## 2017-11-10 DIAGNOSIS — N179 Acute kidney failure, unspecified: Secondary | ICD-10-CM | POA: Diagnosis not present

## 2017-11-10 NOTE — Patient Outreach (Signed)
  [place] [facility]) Care Management  11/10/2017  [name] 31-Jan-1941 [id number]  Care Coordination  Incoming call from  [facility] office of [place], returning call  to office on 1/24 to clarify how often patient to catheterize daily. Discussed with representative that patient reports that he is catheterizing self once daily. Nurse reports per MD note reads  patient should be catheterizing self twice daily after urinating and keeping a record, to determine if emptying bladder after urinating.  Discussed I would reiterate this with patient .  Unsuccessful call to patient , no answer able to leave a HIPAA compliant message.   Plan  Will plan return call within this week.  [name], RN, [place] Management Coordinator  [phone]- Mobile [phone]- Toll Free Main Office

## 2017-11-12 ENCOUNTER — Ambulatory Visit: Payer: Self-pay | Admitting: *Deleted

## 2017-11-13 ENCOUNTER — Other Ambulatory Visit: Payer: Self-pay

## 2017-11-13 ENCOUNTER — Emergency Department: Payer: PPO

## 2017-11-13 ENCOUNTER — Inpatient Hospital Stay
Admission: EM | Admit: 2017-11-13 | Discharge: 2017-11-15 | DRG: 699 | Disposition: A | Discharge status: Home / Self Care | Payer: PPO | Attending: Internal Medicine | Admitting: Internal Medicine

## 2017-11-13 ENCOUNTER — Encounter: Payer: Self-pay | Admitting: Emergency Medicine

## 2017-11-13 DIAGNOSIS — Y846 Urinary catheterization as the cause of abnormal reaction of the patient, or of later complication, without mention of misadventure at the time of the procedure: Secondary | ICD-10-CM | POA: Diagnosis present

## 2017-11-13 DIAGNOSIS — I129 Hypertensive chronic kidney disease with stage 1 through stage 4 chronic kidney disease, or unspecified chronic kidney disease: Secondary | ICD-10-CM | POA: Diagnosis present

## 2017-11-13 DIAGNOSIS — T45525A Adverse effect of antithrombotic drugs, initial encounter: Secondary | ICD-10-CM | POA: Diagnosis not present

## 2017-11-13 DIAGNOSIS — R310 Gross hematuria: Secondary | ICD-10-CM

## 2017-11-13 DIAGNOSIS — N183 Chronic kidney disease, stage 3 (moderate): Secondary | ICD-10-CM | POA: Diagnosis not present

## 2017-11-13 DIAGNOSIS — E875 Hyperkalemia: Secondary | ICD-10-CM

## 2017-11-13 DIAGNOSIS — S3739XA Other injury of urethra, initial encounter: Principal | ICD-10-CM | POA: Diagnosis present

## 2017-11-13 DIAGNOSIS — E1122 Type 2 diabetes mellitus with diabetic chronic kidney disease: Secondary | ICD-10-CM | POA: Diagnosis not present

## 2017-11-13 DIAGNOSIS — N179 Acute kidney failure, unspecified: Secondary | ICD-10-CM | POA: Diagnosis present

## 2017-11-13 DIAGNOSIS — E785 Hyperlipidemia, unspecified: Secondary | ICD-10-CM | POA: Diagnosis present

## 2017-11-13 DIAGNOSIS — I252 Old myocardial infarction: Secondary | ICD-10-CM | POA: Diagnosis not present

## 2017-11-13 DIAGNOSIS — Z794 Long term (current) use of insulin: Secondary | ICD-10-CM

## 2017-11-13 DIAGNOSIS — Y92009 Unspecified place in unspecified non-institutional (private) residence as the place of occurrence of the external cause: Secondary | ICD-10-CM

## 2017-11-13 DIAGNOSIS — Z8673 Personal history of transient ischemic attack (TIA), and cerebral infarction without residual deficits: Secondary | ICD-10-CM

## 2017-11-13 DIAGNOSIS — Z7982 Long term (current) use of aspirin: Secondary | ICD-10-CM

## 2017-11-13 DIAGNOSIS — Z85038 Personal history of other malignant neoplasm of large intestine: Secondary | ICD-10-CM

## 2017-11-13 DIAGNOSIS — R319 Hematuria, unspecified: Secondary | ICD-10-CM | POA: Diagnosis present

## 2017-11-13 DIAGNOSIS — Z7901 Long term (current) use of anticoagulants: Secondary | ICD-10-CM

## 2017-11-13 DIAGNOSIS — E039 Hypothyroidism, unspecified: Secondary | ICD-10-CM | POA: Diagnosis present

## 2017-11-13 DIAGNOSIS — R339 Retention of urine, unspecified: Secondary | ICD-10-CM | POA: Diagnosis not present

## 2017-11-13 DIAGNOSIS — Z87891 Personal history of nicotine dependence: Secondary | ICD-10-CM

## 2017-11-13 DIAGNOSIS — N189 Chronic kidney disease, unspecified: Secondary | ICD-10-CM

## 2017-11-13 DIAGNOSIS — I2510 Atherosclerotic heart disease of native coronary artery without angina pectoris: Secondary | ICD-10-CM | POA: Diagnosis present

## 2017-11-13 DIAGNOSIS — N319 Neuromuscular dysfunction of bladder, unspecified: Secondary | ICD-10-CM | POA: Diagnosis not present

## 2017-11-13 DIAGNOSIS — Z833 Family history of diabetes mellitus: Secondary | ICD-10-CM

## 2017-11-13 DIAGNOSIS — Z8249 Family history of ischemic heart disease and other diseases of the circulatory system: Secondary | ICD-10-CM

## 2017-11-13 DIAGNOSIS — Z951 Presence of aortocoronary bypass graft: Secondary | ICD-10-CM

## 2017-11-13 DIAGNOSIS — N1330 Unspecified hydronephrosis: Secondary | ICD-10-CM | POA: Diagnosis present

## 2017-11-13 DIAGNOSIS — E1151 Type 2 diabetes mellitus with diabetic peripheral angiopathy without gangrene: Secondary | ICD-10-CM | POA: Diagnosis present

## 2017-11-13 DIAGNOSIS — Z79890 Hormone replacement therapy (postmenopausal): Secondary | ICD-10-CM

## 2017-11-13 LAB — BASIC METABOLIC PANEL
Anion gap: 9 (ref 5–15)
BUN: 46 mg/dL — AB (ref 6–20)
CO2: 19 mmol/L — ABNORMAL LOW (ref 22–32)
CREATININE: 3.41 mg/dL — AB (ref 0.61–1.24)
Calcium: 8.8 mg/dL — ABNORMAL LOW (ref 8.9–10.3)
Chloride: 112 mmol/L — ABNORMAL HIGH (ref 101–111)
GFR calc Af Amer: 19 mL/min — ABNORMAL LOW (ref >60)
GFR, EST NON AFRICAN AMERICAN: 16 mL/min — AB (ref >60)
Glucose, Bld: 60 mg/dL — ABNORMAL LOW (ref 65–99)
POTASSIUM: 5.6 mmol/L — AB (ref 3.5–5.1)
SODIUM: 140 mmol/L (ref 135–145)

## 2017-11-13 LAB — PROTIME-INR
INR: 1.07
PROTHROMBIN TIME: 13.8 s (ref 11.4–15.2)

## 2017-11-13 LAB — CBC WITH DIFFERENTIAL/PLATELET
Basophils Absolute: 0.1 10*3/uL (ref 0–0.1)
Basophils Relative: 1 %
EOS ABS: 0.3 10*3/uL (ref 0–0.7)
EOS PCT: 3 %
HCT: 38.3 % — ABNORMAL LOW (ref 40.0–52.0)
Hemoglobin: 12.5 g/dL — ABNORMAL LOW (ref 13.0–18.0)
LYMPHS ABS: 3.3 10*3/uL (ref 1.0–3.6)
LYMPHS PCT: 38 %
MCH: 30.7 pg (ref 26.0–34.0)
MCHC: 32.8 g/dL (ref 32.0–36.0)
MCV: 93.6 fL (ref 80.0–100.0)
MONO ABS: 0.8 10*3/uL (ref 0.2–1.0)
MONOS PCT: 9 %
Neutro Abs: 4.3 10*3/uL (ref 1.4–6.5)
Neutrophils Relative %: 49 %
PLATELETS: 134 10*3/uL — AB (ref 150–440)
RBC: 4.09 MIL/uL — AB (ref 4.40–5.90)
RDW: 15.9 % — AB (ref 11.5–14.5)
WBC: 8.7 10*3/uL (ref 3.8–10.6)

## 2017-11-13 LAB — URINALYSIS, COMPLETE (UACMP) WITH MICROSCOPIC
Specific Gravity, Urine: 1.015 (ref 1.005–1.030)
Squamous Epithelial / LPF: NONE SEEN

## 2017-11-13 LAB — APTT: aPTT: 32 seconds (ref 24–36)

## 2017-11-13 MED ORDER — SODIUM CHLORIDE 0.9 % IV BOLUS (SEPSIS)
500.0000 mL | Freq: Once | INTRAVENOUS | Status: AC
  Administered 2017-11-13: 500 mL via INTRAVENOUS

## 2017-11-13 MED ORDER — DEXTROSE 50 % IV SOLN
50.0000 mL | Freq: Once | INTRAVENOUS | Status: AC
  Administered 2017-11-13: 50 mL via INTRAVENOUS
  Filled 2017-11-13: qty 50

## 2017-11-13 MED ORDER — METOPROLOL TARTRATE 5 MG/5ML IV SOLN
INTRAVENOUS | Status: AC
  Filled 2017-11-13: qty 5

## 2017-11-13 MED ORDER — SODIUM BICARBONATE 8.4 % IV SOLN
50.0000 meq | Freq: Once | INTRAVENOUS | Status: AC
  Administered 2017-11-13: 50 meq via INTRAVENOUS
  Filled 2017-11-13: qty 50

## 2017-11-13 MED ORDER — INSULIN ASPART 100 UNIT/ML ~~LOC~~ SOLN
5.0000 [IU] | Freq: Once | SUBCUTANEOUS | Status: AC
  Administered 2017-11-13: 5 [IU] via INTRAVENOUS
  Filled 2017-11-13: qty 1

## 2017-11-13 NOTE — ED Notes (Signed)
Pt voided 200cc Bladder scan post void 1000cc. Pt felt he couldn't empty his bladder because he had so many cords attached. Monitors removed and pt voided another 200cc and bladder scan 600cc

## 2017-11-13 NOTE — ED Provider Notes (Signed)
[facility] Emergency Department Provider Note  ____________________________________________  Time seen: Approximately 11:42 PM  I have reviewed the triage vital signs and the nursing notes.   HISTORY  Chief Complaint Hematuria   HPI [name] is a 77 y.o. male the history of CAD on Plavix, diabetes, hypertension, peripheral vascular disease who presents for evaluation of gross hematuria. Patient reports that his symptoms started this evening. Ongoing for 2 hours, severe, constant. Has had 2 episodes of gross hematuria with small clots. He denies abdominal pain or flank pain. Patient reports similar episode in November 2018 when he was diagnosed with a renal mass which was removed by Dr. Erlene Quan. The mass was benign. Patient denies dysuria. He denies dizziness, chest pain, or shortness of breath.  Past Medical History:  Diagnosis Date  . Blood transfusion without reported diagnosis   . Cancer [facility])    Colon  . Complication of anesthesia    raspy voice since carotid endarterectomy 07/17/17  . Coronary artery disease   . Diabetes mellitus without complication ([place])   . Dysrhythmia   . Hyperlipidemia   . Hypertension   . Myocardial infarction ([place]) 1997  . Peripheral vascular disease ([place])   . Pneumonia 2014  . Stroke ([place]) 05/2017  . Thyroid disease     Patient Active Problem List   Diagnosis Date Noted  . Urinary retention 08/28/2017  . CAP (community acquired pneumonia) 08/11/2017  . Bilateral carotid artery stenosis 08/01/2017  . Hoarseness of voice 08/01/2017  . Compression fracture of body of thoracic vertebra ([place]) 07/28/2017  . Carotid stenosis, symptomatic, with infarction ([place]) 07/17/2017  . Cerebrovascular accident (CVA) ([place]) 06/11/2017  . Chronic shoulder bursitis, left 07/22/2016  . Cholecystitis, acute 04/21/2016  . Uncontrolled type 2 diabetes mellitus with stage 4 chronic kidney disease, with long-term current use of insulin  ([place]) 09/14/2015  . 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 CKD stage 4 due to type 2 diabetes mellitus ([place]) 03/16/2015  . Diabetes mellitus with polyneuropathy ([place]) 03/16/2015  . CAD in native artery 01/20/2015  . Gout 01/20/2015  . Diabetic peripheral neuropathy ([place]) 01/20/2015  . H/O malignant neoplasm of colon 01/20/2015  . Adult hypothyroidism 01/20/2015  . Hyperlipidemia associated with type 2 diabetes mellitus ([place]) 01/20/2015  . Encounter for long-term (current) use of insulin ([place]) 07/27/2014  . High potassium 05/19/2014  . 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 Hypertension in stage 4 chronic kidney disease due to type 2 diabetes mellitus ([place]) 07/15/2013    Past Surgical History:  Procedure Laterality Date  . APPENDECTOMY  2011  . BACK SURGERY    . CARDIAC CATHETERIZATION    . CAROTID ANGIOGRAPHY Right 06/13/2017   Procedure: Right subclavian and Carotid Angiography, possible intervention;  Surgeon: Algernon Huxley, MD;  Location: [facility];  Service: Cardiovascular;  Laterality: Right;  . CHOLECYSTECTOMY N/A 04/23/2016   Procedure: LAPAROSCOPIC CHOLECYSTECTOMY;  Surgeon: [name], MD;  Location: [facility];  Service: General;  Laterality: N/A;  . COLON SURGERY  2011   Colectomy for ileo-cecal valve cancer  . CORONARY ARTERY BYPASS GRAFT  1997   x 3  . CYSTOSCOPY W/ RETROGRADES Bilateral 09/03/2017   Procedure: CYSTOSCOPY WITH RETROGRADE PYELOGRAM;  Surgeon: Hollice Espy, MD;  Location: [facility] OR[facility];  Service: Urology;  Laterality: Bilateral;  . CYSTOSCOPY WITH STENT PLACEMENT Right 09/03/2017   Procedure: CYSTOSCOPY WITH STENT PLACEMENT;  Surgeon: Hollice Espy, MD;  Location: [facility];  Service: Urology;  Laterality: Right;  . ENDARTERECTOMY Right 07/17/2017   Procedure: ENDARTERECTOMY CAROTID;  Surgeon: Algernon Huxley, MD;  Location: [facility];  Service: Vascular;  Laterality: Right;  . HERNIA REPAIR  2011   Ventral  hernia  . HOLMIUM LASER APPLICATION N/A [id number]   Procedure: HOLMIUM LASER APPLICATION;  Surgeon: Hollice Espy, MD;  Location: [facility];  Service: Urology;  Laterality: N/A;  . KNEE SURGERY Left   . KYPHOPLASTY N/A 08/14/2017   Procedure: [id number];  Surgeon: Hessie Knows, MD;  Location: [facility] OR[facility];  Service: Orthopedics;  Laterality: N/A;  . PROSTATE SURGERY  2002   BPH benign pathology  . SPINE SURGERY  1989   Lumbar disc    Prior to Admission medications   Medication Sig Start Date End Date Taking? Authorizing Provider  acetaminophen (TYLENOL) 500 MG tablet Take 1,000 mg every 6 (six) hours as needed by mouth for moderate pain or headache.    [provider]  aspirin EC 81 MG EC tablet Take 1 tablet (81 mg total) by mouth daily. 07/19/17   Algernon Huxley, MD  atenolol (TENORMIN) 50 MG tablet Take 1 tablet (50 mg total) by mouth daily. 08/08/17   Glean Hess, MD  atorvastatin (LIPITOR) 40 MG tablet Take 1 tablet (40 mg total) daily at 6 PM by mouth. 08/28/17   Glean Hess, MD  clopidogrel (PLAVIX) 75 MG tablet Take 1 tablet (75 mg total) by mouth daily. 08/08/17   Glean Hess, MD  docusate sodium (COLACE) 100 MG capsule Take 1 capsule (100 mg total) by mouth 2 (two) times daily. Patient not taking: Reported on 10/31/2017 08/15/17   Vaughan Basta, MD  HYDROcodone-acetaminophen (NORCO/VICODIN) 5-325 MG tablet Take 1-2 tablets by mouth every 6 (six) hours as needed for moderate pain. Patient not taking: Reported on 10/31/2017 09/03/17   Hollice Espy, MD  insulin NPH Human (HUMULIN N,NOVOLIN N) 100 UNIT/ML injection Inject 0.25 mLs (25 Units total) into the skin 2 (two) times daily. 08/15/17   Vaughan Basta, MD  insulin regular (NOVOLIN R,HUMULIN R) 100 units/mL injection Inject 10-15 Units See admin instructions into the skin. Inject 15 units SQ in the morning and inject 10 units SQ with supper    [provider]  levothyroxine  (SYNTHROID, LEVOTHROID) 300 MCG tablet Take 300 mcg by mouth daily before breakfast.    [provider]  polyethylene glycol (MIRALAX / GLYCOLAX) packet Take 17 g by mouth daily as needed for mild constipation. Patient not taking: Reported on 10/31/2017 08/15/17   Vaughan Basta, MD  sodium bicarbonate 650 MG tablet Take 650 mg 2 (two) times daily by mouth.  04/28/17 04/28/18  [provider]  tamsulosin (FLOMAX) 0.4 MG CAPS capsule Take 1 capsule (0.4 mg total) by mouth daily after breakfast. Patient not taking: Reported on 10/31/2017 08/16/17   Vaughan Basta, MD  tamsulosin (FLOMAX) 0.4 MG CAPS capsule Take 1 capsule (0.4 mg total) by mouth daily. 10/08/17   Nickie Retort, MD    Allergies Ace inhibitors and Quinapril  Family History  Problem Relation Age of Onset  . Cancer Mother        Lung  . Diabetes Mother   . Heart disease Mother   . Heart disease Father     Social History Social History   Tobacco Use  . Smoking status: Former Smoker    Types: Cigarettes    Last attempt to quit: 06/24/1996    Years since quitting: 21.4  .  Smokeless tobacco: Never Used  Substance Use Topics  . Alcohol use: No    Alcohol/week: 0.0 oz  . Drug use: No    Review of Systems  Constitutional: Negative for fever. Eyes: Negative for visual changes. ENT: Negative for sore throat. Neck: No neck pain  Cardiovascular: Negative for chest pain. Respiratory: Negative for shortness of breath. Gastrointestinal: Negative for abdominal pain, vomiting or diarrhea. Genitourinary: Negative for dysuria. + hematuria Musculoskeletal: Negative for back pain. Skin: Negative for rash. Neurological: Negative for headaches, weakness or numbness. Psych: No SI or HI  ____________________________________________   PHYSICAL EXAM:  VITAL SIGNS: ED Triage Vitals  Enc Vitals Group     BP 11/13/17 1938 135/84     Pulse Rate 11/13/17 1938 72     Resp 11/13/17 1938 18      Temp 11/13/17 1938 (!) 97.4 F (36.3 C)     Temp Source 11/13/17 1938 Oral     SpO2 11/13/17 1938 100 %     Weight 11/13/17 1938 210 lb (95.3 kg)     Height 11/13/17 1938 6\' 2"  (1.88 m)     Head Circumference --      Peak Flow --      Pain Score 11/13/17 1955 0     Pain Loc --      Pain Edu? --      Excl. in [place]? --     Constitutional: Alert and oriented. Well appearing and in no apparent distress. HEENT:      Head: Normocephalic and atraumatic.         Eyes: Conjunctivae are normal. Sclera is non-icteric.       Mouth/Throat: Mucous membranes are moist.       Neck: Supple with no signs of meningismus. Cardiovascular: Regular rate and rhythm. No murmurs, gallops, or rubs. 2+ symmetrical distal pulses are present in all extremities. No JVD. Respiratory: Normal respiratory effort. Lungs are clear to auscultation bilaterally. No wheezes, crackles, or rhonchi.  Gastrointestinal: Soft, non tender, and non distended with positive bowel sounds. No rebound or guarding. Musculoskeletal: Nontender with normal range of motion in all extremities. No edema, cyanosis, or erythema of extremities. Neurologic: Normal speech and language. Face is symmetric. Moving all extremities. No gross focal neurologic deficits are appreciated. Skin: Skin is warm, dry and intact. No rash noted. Psychiatric: Mood and affect are normal. Speech and behavior are normal.  ____________________________________________   LABS (all labs ordered are listed, but only abnormal results are displayed)  Labs Reviewed  CBC WITH DIFFERENTIAL/PLATELET - Abnormal; Notable for the following components:      Result Value   RBC 4.09 (*)    Hemoglobin 12.5 (*)    HCT 38.3 (*)    RDW 15.9 (*)    Platelets 134 (*)    All other components within normal limits  BASIC METABOLIC PANEL - Abnormal; Notable for the following components:   Potassium 5.6 (*)    Chloride 112 (*)    CO2 19 (*)    Glucose, Bld 60 (*)    BUN 46 (*)     Creatinine, Ser 3.41 (*)    Calcium 8.8 (*)    GFR calc non Af Amer 16 (*)    GFR calc Af Amer 19 (*)    All other components within normal limits  URINALYSIS, COMPLETE (UACMP) WITH MICROSCOPIC - Abnormal; Notable for the following components:   Color, Urine RED (*)    APPearance TURBID (*)    Glucose, UA   (*)  Value: TEST NOT REPORTED DUE TO COLOR INTERFERENCE OF URINE PIGMENT   Hgb urine dipstick   (*)    Value: TEST NOT REPORTED DUE TO COLOR INTERFERENCE OF URINE PIGMENT   Bilirubin Urine   (*)    Value: TEST NOT REPORTED DUE TO COLOR INTERFERENCE OF URINE PIGMENT   Ketones, ur   (*)    Value: TEST NOT REPORTED DUE TO COLOR INTERFERENCE OF URINE PIGMENT   Protein, ur   (*)    Value: TEST NOT REPORTED DUE TO COLOR INTERFERENCE OF URINE PIGMENT   Nitrite   (*)    Value: TEST NOT REPORTED DUE TO COLOR INTERFERENCE OF URINE PIGMENT   Leukocytes, UA   (*)    Value: TEST NOT REPORTED DUE TO COLOR INTERFERENCE OF URINE PIGMENT   Bacteria, UA RARE (*)    All other components within normal limits  PROTIME-INR  APTT   ____________________________________________  EKG  ED ECG REPORT I, Rudene Re, the attending physician, personally viewed and interpreted this ECG.  Normal sinus rhythm, rate of 75, normal intervals, normal axis, occasional PVCs, no peaked T waves, no ST elevations or depressions. unchanged from prior from October 2018 ____________________________________________  RADIOLOGY  Interpreted by me: CT renal: no evidence of renal mass or kidney stones.   Interpretation by Radiologist:  Ct Renal Stone Study  Result Date: 11/13/2017 CLINICAL DATA:  Hematuria EXAM: CT ABDOMEN AND PELVIS WITHOUT CONTRAST TECHNIQUE: Multidetector CT imaging of the abdomen and pelvis was performed following the standard protocol without IV contrast. COMPARISON:  08/10/2017 FINDINGS: Lower chest: Heart size upper normal. Coronary artery calcification is evident. Hepatobiliary: No  focal abnormality in the liver on this study without intravenous contrast. Gallbladder surgically absent. No intrahepatic or extrahepatic biliary dilation. Pancreas: No focal mass lesion. No dilatation of the main duct. No intraparenchymal cyst. No peripancreatic edema. Spleen: No splenomegaly. No focal mass lesion. Adrenals/Urinary Tract: No adrenal nodule or mass. Kidneys appear atrophic bilaterally. Prominent right extrarenal pelvis again noted. The hyperattenuating dependent opacity in the right renal pelvis on the previous study is no longer evident. There is right ureteral fullness down to the level of the UVJ without etiology for the ureteral distention. Left ureter unremarkable. Mild bladder wall thickening is associated with bladder diverticuli. Stomach/Bowel: Stomach is nondistended. No gastric wall thickening. No evidence of outlet obstruction. Duodenum is normally positioned as is the ligament of Treitz. Duodenal diverticulum evident. No small bowel wall thickening. No small bowel dilatation. Prior right hemicolectomy. 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 Vascular/Lymphatic: There is abdominal aortic atherosclerosis without aneurysm. There is no gastrohepatic or hepatoduodenal ligament lymphadenopathy. No intraperitoneal or retroperitoneal lymphadenopathy. No pelvic sidewall lymphadenopathy. Reproductive: Prostate gland surgically absent. Other: No intraperitoneal free fluid. Musculoskeletal: Patient is status post vertebral augmentation at T12. IMPRESSION: 1. Mild right hydroureteronephrosis without definable etiology. Hyperattenuating lesion in the right renal pelvis on the previous study is no longer evident. 2. 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 3.  Aortic Atherosclerois (ICD10-170.0) 4. Right hemicolectomy. Electronically Signed   By: Misty Stanley M.D.   On: 11/13/2017 20:39       ____________________________________________   PROCEDURES  Procedure(s) performed: None Procedures Critical Care performed:  None ____________________________________________   INITIAL IMPRESSION / ASSESSMENT AND PLAN / ED COURSE  77 y.o. male the history of CAD on Plavix, diabetes, hypertension, peripheral vascular disease who presents for evaluation of gross hematuria  that started this evening. patient on Plavix, hemodynamically stable, creatinine poor at baseline but stable. hgb stable. Hyperkalemia will treat with bicarbonate, IVF, D50, and insulin. No EKG changes. Will check PVR and consult Urology  Clinical Course as of Nov 14 2347  Thu Nov 13, 2017  2227 Discussed with Dr. Bernardo Heater, urologist on call who recommended outpatient follow-up tomorrow in the office if patient is not retaining due to clot burden. At this time patient is receiving insulin, bicarbonate, D50, and fluids for mild hyperkalemia. His EKG shows no evidence of hyperkalemia. We'll check a post void residual. Urine has been sent for culture. Patient has been updated.  [CV]  2338 Patient with urinary retention, Foley placed. Total of 1L gross hematuria. Kidney function is poor at baseline, K elevated, patient on Plavix.   [CV]    Clinical Course User Index [CV] Alfred Levins [place], MD     As part of my medical decision making, I reviewed the following data within the [facility] notes reviewed and incorporated, Labs reviewed , EKG interpreted , Old EKG reviewed, Radiograph reviewed , Discussed with admitting physician , A consult was requested and obtained from this/these consultant(s) Urology, Notes from prior ED visits and [place] Controlled Substance Database    Pertinent labs & imaging results that were available during my care of the patient were reviewed by me and considered in my medical decision making (see chart for  details).    ____________________________________________   FINAL CLINICAL IMPRESSION(S) / ED DIAGNOSES  Final diagnoses:  Gross hematuria  Chronic kidney disease, unspecified CKD stage  Hyperkalemia  Current use of long term anticoagulation  Urinary retention      NEW MEDICATIONS STARTED DURING THIS VISIT:  ED Discharge Orders    None       Note:  This document was prepared using Dragon voice recognition software and may include unintentional dictation errors.    Rudene Re, MD 11/13/17 [phone]

## 2017-11-13 NOTE — ED Triage Notes (Addendum)
Patient ambulatory to triage with steady gait, without difficulty or distress noted; pt reports that he self-caths; kidney mass removed in Oct; at last cath at 630pm began having bleeding and unsure if has injured himself; denies pain; pt currently taking plavix

## 2017-11-14 ENCOUNTER — Other Ambulatory Visit: Payer: Self-pay

## 2017-11-14 ENCOUNTER — Ambulatory Visit: Payer: Self-pay | Admitting: *Deleted

## 2017-11-14 ENCOUNTER — Encounter: Payer: Self-pay | Admitting: Internal Medicine

## 2017-11-14 DIAGNOSIS — E1151 Type 2 diabetes mellitus with diabetic peripheral angiopathy without gangrene: Secondary | ICD-10-CM | POA: Diagnosis not present

## 2017-11-14 DIAGNOSIS — S3739XA Other injury of urethra, initial encounter: Secondary | ICD-10-CM | POA: Diagnosis not present

## 2017-11-14 DIAGNOSIS — R319 Hematuria, unspecified: Secondary | ICD-10-CM | POA: Diagnosis present

## 2017-11-14 DIAGNOSIS — R339 Retention of urine, unspecified: Secondary | ICD-10-CM | POA: Diagnosis not present

## 2017-11-14 DIAGNOSIS — I129 Hypertensive chronic kidney disease with stage 1 through stage 4 chronic kidney disease, or unspecified chronic kidney disease: Secondary | ICD-10-CM | POA: Diagnosis not present

## 2017-11-14 DIAGNOSIS — N189 Chronic kidney disease, unspecified: Secondary | ICD-10-CM | POA: Diagnosis not present

## 2017-11-14 DIAGNOSIS — I10 Essential (primary) hypertension: Secondary | ICD-10-CM | POA: Diagnosis not present

## 2017-11-14 DIAGNOSIS — T45525A Adverse effect of antithrombotic drugs, initial encounter: Secondary | ICD-10-CM | POA: Diagnosis not present

## 2017-11-14 DIAGNOSIS — Y846 Urinary catheterization as the cause of abnormal reaction of the patient, or of later complication, without mention of misadventure at the time of the procedure: Secondary | ICD-10-CM | POA: Diagnosis not present

## 2017-11-14 DIAGNOSIS — I252 Old myocardial infarction: Secondary | ICD-10-CM | POA: Diagnosis not present

## 2017-11-14 DIAGNOSIS — E875 Hyperkalemia: Secondary | ICD-10-CM | POA: Diagnosis not present

## 2017-11-14 DIAGNOSIS — Z794 Long term (current) use of insulin: Secondary | ICD-10-CM | POA: Diagnosis not present

## 2017-11-14 DIAGNOSIS — I2510 Atherosclerotic heart disease of native coronary artery without angina pectoris: Secondary | ICD-10-CM | POA: Diagnosis not present

## 2017-11-14 DIAGNOSIS — E785 Hyperlipidemia, unspecified: Secondary | ICD-10-CM | POA: Diagnosis not present

## 2017-11-14 DIAGNOSIS — N179 Acute kidney failure, unspecified: Secondary | ICD-10-CM | POA: Diagnosis not present

## 2017-11-14 DIAGNOSIS — Z951 Presence of aortocoronary bypass graft: Secondary | ICD-10-CM | POA: Diagnosis not present

## 2017-11-14 DIAGNOSIS — Z8673 Personal history of transient ischemic attack (TIA), and cerebral infarction without residual deficits: Secondary | ICD-10-CM | POA: Diagnosis not present

## 2017-11-14 DIAGNOSIS — Y92009 Unspecified place in unspecified non-institutional (private) residence as the place of occurrence of the external cause: Secondary | ICD-10-CM | POA: Diagnosis not present

## 2017-11-14 DIAGNOSIS — Z833 Family history of diabetes mellitus: Secondary | ICD-10-CM | POA: Diagnosis not present

## 2017-11-14 DIAGNOSIS — E039 Hypothyroidism, unspecified: Secondary | ICD-10-CM | POA: Diagnosis not present

## 2017-11-14 DIAGNOSIS — R310 Gross hematuria: Secondary | ICD-10-CM | POA: Diagnosis not present

## 2017-11-14 DIAGNOSIS — E1122 Type 2 diabetes mellitus with diabetic chronic kidney disease: Secondary | ICD-10-CM | POA: Diagnosis not present

## 2017-11-14 DIAGNOSIS — N319 Neuromuscular dysfunction of bladder, unspecified: Secondary | ICD-10-CM | POA: Diagnosis not present

## 2017-11-14 DIAGNOSIS — Z7982 Long term (current) use of aspirin: Secondary | ICD-10-CM | POA: Diagnosis not present

## 2017-11-14 DIAGNOSIS — N183 Chronic kidney disease, stage 3 (moderate): Secondary | ICD-10-CM | POA: Diagnosis not present

## 2017-11-14 DIAGNOSIS — Z87891 Personal history of nicotine dependence: Secondary | ICD-10-CM | POA: Diagnosis not present

## 2017-11-14 DIAGNOSIS — N1330 Unspecified hydronephrosis: Secondary | ICD-10-CM | POA: Diagnosis not present

## 2017-11-14 DIAGNOSIS — Z85038 Personal history of other malignant neoplasm of large intestine: Secondary | ICD-10-CM | POA: Diagnosis not present

## 2017-11-14 DIAGNOSIS — Z8249 Family history of ischemic heart disease and other diseases of the circulatory system: Secondary | ICD-10-CM | POA: Diagnosis not present

## 2017-11-14 HISTORY — DX: Chronic kidney disease, unspecified: N18.9

## 2017-11-14 LAB — BASIC METABOLIC PANEL
ANION GAP: 7 (ref 5–15)
ANION GAP: 9 (ref 5–15)
BUN: 44 mg/dL — ABNORMAL HIGH (ref 6–20)
BUN: 42 mg/dL — ABNORMAL HIGH (ref 6–20)
CALCIUM: 8.1 mg/dL — AB (ref 8.9–10.3)
CHLORIDE: 110 mmol/L (ref 101–111)
CO2: 17 mmol/L — AB (ref 22–32)
CO2: 19 mmol/L — AB (ref 22–32)
Calcium: 8.5 mg/dL — ABNORMAL LOW (ref 8.9–10.3)
Chloride: 114 mmol/L — ABNORMAL HIGH (ref 101–111)
Creatinine, Ser: 3.07 mg/dL — ABNORMAL HIGH (ref 0.61–1.24)
Creatinine, Ser: 3.06 mg/dL — ABNORMAL HIGH (ref 0.61–1.24)
GFR calc non Af Amer: 18 mL/min — ABNORMAL LOW (ref >60)
GFR calc non Af Amer: 18 mL/min — ABNORMAL LOW (ref >60)
GFR, EST AFRICAN AMERICAN: 21 mL/min — AB (ref >60)
GFR, EST AFRICAN AMERICAN: 21 mL/min — AB (ref >60)
GLUCOSE: 103 mg/dL — AB (ref 65–99)
Glucose, Bld: 165 mg/dL — ABNORMAL HIGH (ref 65–99)
POTASSIUM: 5.2 mmol/L — AB (ref 3.5–5.1)
POTASSIUM: 5.8 mmol/L — AB (ref 3.5–5.1)
Sodium: 138 mmol/L (ref 135–145)
Sodium: 138 mmol/L (ref 135–145)

## 2017-11-14 LAB — CBC
HEMATOCRIT: 34.9 % — AB (ref 40.0–52.0)
HEMOGLOBIN: 11.4 g/dL — AB (ref 13.0–18.0)
MCH: 30.5 pg (ref 26.0–34.0)
MCHC: 32.6 g/dL (ref 32.0–36.0)
MCV: 93.6 fL (ref 80.0–100.0)
Platelets: 124 10*3/uL — ABNORMAL LOW (ref 150–440)
RBC: 3.73 MIL/uL — AB (ref 4.40–5.90)
RDW: 15.9 % — ABNORMAL HIGH (ref 11.5–14.5)
WBC: 8.3 10*3/uL (ref 3.8–10.6)

## 2017-11-14 MED ORDER — SODIUM BICARBONATE 650 MG PO TABS
650.0000 mg | ORAL_TABLET | Freq: Two times a day (BID) | ORAL | Status: DC
  Administered 2017-11-14 – 2017-11-15 (×3): 650 mg via ORAL
  Filled 2017-11-14 (×3): qty 1

## 2017-11-14 MED ORDER — ACETAMINOPHEN 325 MG PO TABS: 650.0000 mg | ORAL_TABLET | Freq: Four times a day (QID) | ORAL | Status: DC | PRN

## 2017-11-14 MED ORDER — SODIUM POLYSTYRENE SULFONATE 15 GM/60ML PO SUSP
15.0000 g | Freq: Once | ORAL | Status: AC
Start: 2017-11-14 — End: 2017-11-14
  Administered 2017-11-14: 15 g via ORAL
  Filled 2017-11-14: qty 60

## 2017-11-14 MED ORDER — ATENOLOL 50 MG PO TABS
50.0000 mg | ORAL_TABLET | Freq: Every day | ORAL | Status: DC
  Administered 2017-11-14 – 2017-11-15 (×2): 50 mg via ORAL
  Filled 2017-11-14 (×2): qty 1

## 2017-11-14 MED ORDER — HYDROCODONE-ACETAMINOPHEN 5-325 MG PO TABS: 1.0000 | ORAL_TABLET | ORAL | Status: DC | PRN

## 2017-11-14 MED ORDER — ONDANSETRON HCL 4 MG/2ML IJ SOLN: 4.0000 mg | Freq: Four times a day (QID) | INTRAMUSCULAR | Status: DC | PRN

## 2017-11-14 MED ORDER — ATORVASTATIN CALCIUM 20 MG PO TABS
40.0000 mg | ORAL_TABLET | Freq: Every day | ORAL | Status: DC
  Administered 2017-11-14 – 2017-11-15 (×2): 40 mg via ORAL
  Filled 2017-11-14 (×2): qty 2

## 2017-11-14 MED ORDER — LEVOTHYROXINE SODIUM 100 MCG PO TABS
300.0000 ug | ORAL_TABLET | Freq: Every day | ORAL | Status: DC
  Administered 2017-11-14 – 2017-11-15 (×2): 300 ug via ORAL
  Filled 2017-11-14 (×2): qty 3

## 2017-11-14 MED ORDER — SENNOSIDES-DOCUSATE SODIUM 8.6-50 MG PO TABS: 1.0000 | ORAL_TABLET | Freq: Every evening | ORAL | Status: DC | PRN

## 2017-11-14 MED ORDER — POLYETHYLENE GLYCOL 3350 17 G PO PACK: 17.0000 g | PACK | Freq: Every day | ORAL | Status: DC | PRN

## 2017-11-14 MED ORDER — SODIUM CHLORIDE 0.9 % IV SOLN
INTRAVENOUS | Status: DC
  Administered 2017-11-14 – 2017-11-15 (×3): via INTRAVENOUS

## 2017-11-14 MED ORDER — TAMSULOSIN HCL 0.4 MG PO CAPS
0.4000 mg | ORAL_CAPSULE | Freq: Every day | ORAL | Status: DC
  Administered 2017-11-14 – 2017-11-15 (×2): 0.4 mg via ORAL
  Filled 2017-11-14 (×2): qty 1

## 2017-11-14 MED ORDER — ACETAMINOPHEN 650 MG RE SUPP: 650.0000 mg | Freq: Four times a day (QID) | RECTAL | Status: DC | PRN

## 2017-11-14 MED ORDER — ONDANSETRON HCL 4 MG PO TABS: 4.0000 mg | ORAL_TABLET | Freq: Four times a day (QID) | ORAL | Status: DC | PRN

## 2017-11-14 NOTE — Progress Notes (Signed)
[facility] NAME: [name]    MR#:  [id number]  DATE OF BIRTH:  04/04/1941  SUBJECTIVE:   Patient here due to hematuria and also acute on chronic renal failure. Renal function slightly improved since yesterday. Remains hyperkalemic. Seen by urology and no plans for intervention as they think this is more urethral trauma.  REVIEW OF SYSTEMS:    Review of Systems  Constitutional: Negative for chills and fever.  HENT: Negative for congestion and tinnitus.   Eyes: Negative for blurred vision and double vision.  Respiratory: Negative for cough, shortness of breath and wheezing.   Cardiovascular: Negative for chest pain, orthopnea and PND.  Gastrointestinal: Negative for abdominal pain, diarrhea, nausea and vomiting.  Genitourinary: Negative for dysuria and hematuria.  Neurological: Negative for dizziness, sensory change and focal weakness.  All other systems reviewed and are negative.   Nutrition: Heart Healthy/Carb modified.  Tolerating Diet: Yes Tolerating PT: Ambulatory     DRUG ALLERGIES:   Allergies  Allergen Reactions  . Ace Inhibitors Other (See Comments)    Reaction:  Raises potassium   . Quinapril Rash and Other (See Comments)    hyperkalemia    VITALS:  Blood pressure (!) 156/69, pulse 65, temperature 98.2 F (36.8 C), temperature source Oral, resp. rate 17, height 6\' 2"  (1.88 m), weight 95.3 kg (210 lb), SpO2 96 %.  PHYSICAL EXAMINATION:   Physical Exam  GENERAL:  77 y.o.-year-old patient lying in bed in no acute distress.  EYES: Pupils equal, round, reactive to light and accommodation. No scleral icterus. Extraocular muscles intact.  HEENT: Head atraumatic, normocephalic. Oropharynx and nasopharynx clear.  NECK:  Supple, no jugular venous distention. No thyroid enlargement, no tenderness.  LUNGS: Normal breath sounds bilaterally, no wheezing, rales, rhonchi. No use of accessory muscles of respiration.   CARDIOVASCULAR: S1, S2 normal. No murmurs, rubs, or gallops.  ABDOMEN: Soft, nontender, nondistended. Bowel sounds present. No organomegaly or mass.  EXTREMITIES: No cyanosis, clubbing or edema b/l.    NEUROLOGIC: Cranial nerves II through XII are intact. No focal Motor or sensory deficits b/l.   PSYCHIATRIC: The patient is alert and oriented x 3.  SKIN: No obvious rash, lesion, or ulcer.   + Foley Cath in place with Blood tinged Urine draining.   LABORATORY PANEL:   CBC Recent Labs  Lab 11/14/17 0449  WBC 8.3  HGB 11.4*  HCT 34.9*  PLT 124*   ------------------------------------------------------------------------------------------------------------------  Chemistries  Recent Labs  Lab 11/14/17 1315  NA 138  K 5.8*  CL 110  CO2 19*  GLUCOSE 165*  BUN 42*  CREATININE 3.06*  CALCIUM 8.5*   ------------------------------------------------------------------------------------------------------------------  Cardiac Enzymes No results for input(s): TROPONINI in the last 168 hours. ------------------------------------------------------------------------------------------------------------------  RADIOLOGY:  Ct Renal Stone Study  Result Date: 11/13/2017 CLINICAL DATA:  Hematuria EXAM: CT ABDOMEN AND PELVIS WITHOUT CONTRAST TECHNIQUE: Multidetector CT imaging of the abdomen and pelvis was performed following the standard protocol without IV contrast. COMPARISON:  08/10/2017 FINDINGS: Lower chest: Heart size upper normal. Coronary artery calcification is evident. Hepatobiliary: No focal abnormality in the liver on this study without intravenous contrast. Gallbladder surgically absent. No intrahepatic or extrahepatic biliary dilation. Pancreas: No focal mass lesion. No dilatation of the main duct. No intraparenchymal cyst. No peripancreatic edema. Spleen: No splenomegaly. No focal mass lesion. Adrenals/Urinary Tract: No adrenal nodule or mass. Kidneys appear atrophic bilaterally.  Prominent right extrarenal pelvis again noted. The hyperattenuating dependent opacity in the  right renal pelvis on the previous study is no longer evident. There is right ureteral fullness down to the level of the UVJ without etiology for the ureteral distention. Left ureter unremarkable. Mild bladder wall thickening is associated with bladder diverticuli. Stomach/Bowel: Stomach is nondistended. No gastric wall thickening. No evidence of outlet obstruction. Duodenum is normally positioned as is the ligament of Treitz. Duodenal diverticulum evident. No small bowel wall thickening. No small bowel dilatation. Prior right hemicolectomy. 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 Vascular/Lymphatic: There is abdominal aortic atherosclerosis without aneurysm. There is no gastrohepatic or hepatoduodenal ligament lymphadenopathy. No intraperitoneal or retroperitoneal lymphadenopathy. No pelvic sidewall lymphadenopathy. Reproductive: Prostate gland surgically absent. Other: No intraperitoneal free fluid. Musculoskeletal: Patient is status post vertebral augmentation at T12. IMPRESSION: 1. Mild right hydroureteronephrosis without definable etiology. Hyperattenuating lesion in the right renal pelvis on the previous study is no longer evident. 2. 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 3.  Aortic Atherosclerois (ICD10-170.0) 4. Right hemicolectomy. Electronically Signed   By: Misty Stanley M.D.   On: 11/13/2017 20:39     ASSESSMENT AND PLAN:   77 year old male with past medical history of essential hypertension, hyperlipidemia, peripheral vascular disease, diabetes, chronic kidney disease stage III, history of urinary retention who presented to the hospital secondary to hematuria and noted to be in acute on chronic renal failure.  1. Hematuria-patient presented to the hospital with gross hematuria which has  improved. Patient's hemoglobin is stable.  -Seen by urology and patient has a previous history of urinary retention and neurogenic bladder and recently underwent right ureteroscopy with removal of a right-sided renal pelvic mass which was not malignant. Patient is on Plavix which is contributing to the patient's hematuria which is currently being held. -Urology thinks this is most likely trauma from patient doing self catheterizations and being on Plavix. -Continue Foley catheter and patient be discharged on that with follow-up with urology as an outpatient. -Continue Flomax.  2. Acute on chronic renal failure-secondary to hematuria and urinary retention. -Status post Foley catheter placement. Continue IV fluids, creatinine is improving.  3. Hyperkalemia-secondary to the acute on chronic kidney injury. -Continue IV fluids, will give a dose of Kayexalate. Repeat potassium in the morning.  4. Essential hypertension-continue atenolol.  5. Hypothyroidism-continue Synthroid.  6. Hyperlipidemia-continue atorvastatin.  Possible d/c home tomorrow if Cr. Improving and Hyperkalemia improved.   All the records are reviewed and case discussed with Care Management/Social Worker. Management plans discussed with the patient, family and they are in agreement.  CODE STATUS: DNR  DVT Prophylaxis: Ted's & SCD's.   TOTAL TIME TAKING CARE OF THIS PATIENT: 30 minutes.   POSSIBLE D/C IN 1-2 DAYS, DEPENDING ON CLINICAL CONDITION.   Henreitta Leber M.D on 11/14/2017 at 2:12 PM  Between 7am to 6pm - Pager - [phone]  After 6pm go to [email or web address] - Proofreader  [facility]  Office  [phone]  CC: Primary care physician; Glean Hess, MD

## 2017-11-14 NOTE — Consult Note (Signed)
Urology Consult  I have been asked to see the patient by Dr. Estanislado Pandy, for evaluation and management of gross hematuria.  Chief Complaint: Blood in urine  History of Present Illness: [name] is a 77 y.o. year old who developed lower urinary tract symptoms and urinary retention after a fall resulting in a T12 fracture in October 2018.  CT showed a distended bladder with a small prostate.  He was also noted to have a 19 mm hyperattenuating right renal pelvic mass and subsequently underwent right ureteroscopy with no evidence of tumor and findings of a soft lamellar stone versus fungus ball.  He had persistent urinary retention and a urodynamic study was consistent with detrusor hypotonicity.  He was last seen in our office on 10/24/2017 and was started on twice daily intermittent catheterization.  He is voiding between catheterizations.  He is on chronic anticoagulation with Plavix.  After catheterizing last night he developed gross hematuria and was concerned and went to the ED.  His urine was described as "red wine" in appearance with small clots.  A noncontrast CT was performed which showed absence of the previously noted renal pelvic abnormality.  There was prominent of the right collecting system and ureter down to the UVJ.  No hyperdense material was seen in the bladder.  His bladder was distended and a Foley catheter was placed with return of 1 L of urine.  His Plavix has been discontinued.  He has a prior history of a probable open prostatectomy back in the 1990s.  Past Medical History:  Diagnosis Date  . Blood transfusion without reported diagnosis   . 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 Complication of anesthesia    raspy voice since carotid endarterectomy 07/17/17  . Coronary artery disease   . Diabetes mellitus without complication ([place])   . Dysrhythmia   . Hyperlipidemia   . Hypertension   . Myocardial infarction ([place]) 1997  . Peripheral vascular disease ([place])   . Pneumonia 2014    . Stroke ([place]) 05/2017  . Thyroid disease     Past Surgical History:  Procedure Laterality Date  . APPENDECTOMY  2011  . BACK SURGERY    . CARDIAC CATHETERIZATION    . CAROTID ANGIOGRAPHY Right 06/13/2017   Procedure: Right subclavian and Carotid Angiography, possible intervention;  Surgeon: Algernon Huxley, MD;  Location: [place] [facility];  Service: Cardiovascular;  Laterality: Right;  . CHOLECYSTECTOMY N/A 04/23/2016   Procedure: LAPAROSCOPIC CHOLECYSTECTOMY;  Surgeon: [name], MD;  Location: [facility] OR[facility];  Service: General;  Laterality: N/A;  . COLON SURGERY  2011   Colectomy for ileo-cecal valve cancer  . CORONARY ARTERY BYPASS GRAFT  1997   x 3  . CYSTOSCOPY W/ RETROGRADES Bilateral 09/03/2017   Procedure: CYSTOSCOPY WITH RETROGRADE PYELOGRAM;  Surgeon: Hollice Espy, MD;  Location: [facility] ORS;  Service: Urology;  Laterality: Bilateral;  . CYSTOSCOPY WITH STENT PLACEMENT Right 09/03/2017   Procedure: CYSTOSCOPY WITH STENT PLACEMENT;  Surgeon: Hollice Espy, MD;  Location: [facility] OR[facility];  Service: Urology;  Laterality: Right;  . ENDARTERECTOMY Right 07/17/2017   Procedure: ENDARTERECTOMY CAROTID;  Surgeon: Algernon Huxley, MD;  Location: [facility];  Service: Vascular;  Laterality: Right;  . HERNIA REPAIR  2011   Ventral hernia  . HOLMIUM LASER APPLICATION N/A 29/93/7169   Procedure: HOLMIUM LASER APPLICATION;  Surgeon: Hollice Espy, MD;  Location: [facility] OR[facility];  Service: Urology;  Laterality: N/A;  . KNEE SURGERY Left   .  KYPHOPLASTY N/A 08/14/2017   Procedure: [id number];  Surgeon: Hessie Knows, MD;  Location: [facility] ORS;  Service: Orthopedics;  Laterality: N/A;  . PROSTATE SURGERY  2002   BPH benign pathology  . SPINE SURGERY  1989   Lumbar disc    Home Medications:  Current Facility-Administered Medications for the 11/13/17 encounter [facility] Encounter)  Medication  . ciprofloxacin (CIPRO) tablet 500 mg  . lidocaine (XYLOCAINE) 2 % jelly 1 application   Current Meds   Medication Sig  . acetaminophen (TYLENOL) 500 MG tablet Take 1,000 mg every 6 (six) hours as needed by mouth for moderate pain or headache.  Marland Kitchen aspirin EC 81 MG EC tablet Take 1 tablet (81 mg total) by mouth daily.  Marland Kitchen atenolol (TENORMIN) 50 MG tablet Take 1 tablet (50 mg total) by mouth daily.  Marland Kitchen atorvastatin (LIPITOR) 40 MG tablet Take 1 tablet (40 mg total) daily at 6 PM by mouth.  . clopidogrel (PLAVIX) 75 MG tablet Take 1 tablet (75 mg total) by mouth daily.  . insulin NPH Human (HUMULIN N,NOVOLIN N) 100 UNIT/ML injection Inject 0.25 mLs (25 Units total) into the skin 2 (two) times daily.  . insulin regular (NOVOLIN R,HUMULIN R) 100 units/mL injection Inject 10-15 Units See admin instructions into the skin. Inject 15 units SQ in the morning and inject 10 units SQ with supper  . levothyroxine (SYNTHROID, LEVOTHROID) 300 MCG tablet Take 300 mcg by mouth daily before breakfast.  . sodium bicarbonate 650 MG tablet Take 650 mg 2 (two) times daily by mouth.   . tamsulosin (FLOMAX) 0.4 MG CAPS capsule Take 1 capsule (0.4 mg total) by mouth daily.    Allergies:  Allergies  Allergen Reactions  . Ace Inhibitors Other (See Comments)    Reaction:  Raises potassium   . Quinapril Rash and Other (See Comments)    hyperkalemia    Family History  Problem Relation Age of Onset  . Cancer Mother        Lung  . Diabetes Mother   . Heart disease Mother   . Heart disease Father     Social History:  reports that he quit smoking about 21 years ago. His smoking use included cigarettes. he has never used smokeless tobacco. He reports that he does not drink alcohol or use drugs.  ROS: A complete review of systems was performed.  All systems are negative except for pertinent findings as noted.  Physical Exam:  Vital signs in last 24 hours: Temp:  [97.4 F (36.3 C)-98.4 F (36.9 C)] 98.4 F (36.9 C) (02/01 0931) Pulse Rate:  [63-74] 70 (02/01 0931) Resp:  [17-27] 17 (02/01 0931) BP:  (135-176)/(54-84) 143/56 (02/01 0931) SpO2:  [96 %-100 %] 96 % (02/01 0931) Weight:  [210 lb (95.3 kg)] 210 lb (95.3 kg) (01/31 1938) Constitutional:  Alert and oriented, No acute distress HEENT: [place] AT, moist mucus membranes.  Trachea midline, no masses Cardiovascular: Regular rate and rhythm, no clubbing, cyanosis, or edema. Respiratory: Normal respiratory effort, lungs clear bilaterally GI: Abdomen is soft, nontender, nondistended, no abdominal masses GU: No CVA tenderness.  Penis without lesions.  Foley catheter in place draining clear urine. Skin: No rashes, bruises or suspicious lesions Lymph: No cervical or inguinal adenopathy Neurologic: Grossly intact, no focal deficits, moving all 4 extremities Psychiatric: Normal mood and affect   Laboratory Data:  Recent Labs    11/13/17 1953 11/14/17 0449  WBC 8.7 8.3  HGB 12.5* 11.4*  HCT 38.3* 34.9*   Recent Labs  11/13/17 1953 11/14/17 0449  NA 140 138  K 5.6* 5.2*  CL 112* 114*  CO2 19* 17*  GLUCOSE 60* 103*  BUN 46* 44*  CREATININE 3.41* 3.07*  CALCIUM 8.8* 8.1*   Recent Labs    11/13/17 1953  INR 1.07   No results for input(s): LABURIN in the last 72 hours. Results for orders placed or performed during the hospital encounter of 09/03/17  Urine Culture     Status: None   Collection Time: 09/03/17  2:52 PM  Result Value Ref Range Status   Specimen Description CYSTOSCOPY  Final   Special Requests NONE  Final   Culture   Final    NO GROWTH Performed at [facility], [address] [place], [place] [address]    Report Status 09/05/2017 FINAL  Final  Stone analysis     Status: None   Collection Time: 09/03/17  3:08 PM  Result Value Ref Range Status   Color Red  Final   Size Comment mm Final    Comment: Specimen received as fragments.   Stone Weight KSTONE 277.0 mg Corrected   Nidus No Nidus visualized  Corrected   Composition Comment  Corrected    Comment: Percentage (Represents the % composition)    COMMENT Comment:  Corrected    Comment: (NOTE) Specimen appears to consist of blood mateial along with a few crystals too small to identify.    STONE COMMENT Note:  Corrected    Comment: (NOTE) Please do not submit specimens on Q-Tips, in tape, on filters, or in liquids such as blood, urine or formalin.  This may cause unnecessary biohazards, erroneous results and/or delay in the processing of the specimen.    Photo Comment  Corrected    Comment: Photograph will follow under separate cover.   Comment: Comment  Corrected    Comment: (NOTE) Physician questions regarding Calculi Analysis contact [facility] at: [phone].    PLEASE NOTE: Comment  Corrected    Comment: (NOTE) Calculi report with photograph will follow via computer, mail or courier delivery.    Disclaimer - Kidney Stone Analysis: Comment  Corrected    Comment: (NOTE) This test was developed and its performance characteristics determined by [facility]. It has not been cleared or approved by the Food and Drug Administration. Performed At: [facility] [place], [place] [id number] Rush Farmer MD RJ:[id number]      Radiologic Imaging: Ct Renal Stone Study  Result Date: 11/13/2017 CLINICAL DATA:  Hematuria EXAM: CT ABDOMEN AND PELVIS WITHOUT CONTRAST TECHNIQUE: Multidetector CT imaging of the abdomen and pelvis was performed following the standard protocol without IV contrast. COMPARISON:  08/10/2017 FINDINGS: Lower chest: Heart size upper normal. Coronary artery calcification is evident. Hepatobiliary: No focal abnormality in the liver on this study without intravenous contrast. Gallbladder surgically absent. No intrahepatic or extrahepatic biliary dilation. Pancreas: No focal mass lesion. No dilatation of the main duct. No intraparenchymal cyst. No peripancreatic edema. Spleen: No splenomegaly. No focal mass lesion. Adrenals/Urinary Tract: No adrenal nodule or mass. Kidneys appear atrophic  bilaterally. Prominent right extrarenal pelvis again noted. The hyperattenuating dependent opacity in the right renal pelvis on the previous study is no longer evident. There is right ureteral fullness down to the level of the UVJ without etiology for the ureteral distention. Left ureter unremarkable. Mild bladder wall thickening is associated with bladder diverticuli. Stomach/Bowel: Stomach is nondistended. No gastric wall thickening. No evidence of outlet obstruction. Duodenum is normally positioned as is the  ligament of [place]. Duodenal diverticulum evident. No small bowel wall thickening. No small bowel dilatation. Prior right hemicolectomy. 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 Vascular/Lymphatic: There is abdominal aortic atherosclerosis without aneurysm. There is no gastrohepatic or hepatoduodenal ligament lymphadenopathy. No intraperitoneal or retroperitoneal lymphadenopathy. No pelvic sidewall lymphadenopathy. Reproductive: Prostate gland surgically absent. Other: No intraperitoneal free fluid. Musculoskeletal: Patient is status post vertebral augmentation at T12. IMPRESSION: 1. Mild right hydroureteronephrosis without definable etiology. Hyperattenuating lesion in the right renal pelvis on the previous study is no longer evident. 2. 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 3.  Aortic Atherosclerois (ICD10-170.0) 4. Right hemicolectomy. Electronically Signed   By: Misty Stanley M.D.   On: 11/13/2017 20:39    Impression/Assessment:  77 year old male with a neurogenic bladder on intermittent catheterization with an episode of gross hematuria last night which does not appear to be significant.  His urine is clear today.  This was most likely secondary to traumatic catheterization and Plavix.  CT does show mild right hydronephrosis which may be secondary to bladder distention.  Upper tract  bleeding with resultant hydronephrosis is less likely but should be considered.  Plan:  He has an appointment in our office on 11/21/2017 with a renal ultrasound on 2/5.  Would discharge with an indwelling Foley catheter and see if his hydronephrosis resolves with bladder drainage.  His catheter can be removed at the time of office follow-up.  11/14/2017, 12:02 PM  John Giovanni,  MD   Thank you for involving me in this patient's care.  Please page with any further questions or concerns. [place]

## 2017-11-14 NOTE — Progress Notes (Signed)
Inpatient Diabetes Program Recommendations  AACE/ADA: New Consensus Statement on Inpatient Glycemic Control (2015)  Target Ranges:  Prepandial:   less than 140 mg/dL      Peak postprandial:   less than 180 mg/dL (1-2 hours)      Critically ill patients:  140 - 180 mg/dL   Results for KAMDIN, FOLLETT (MRN [id number]) as of 11/14/2017 08:55  Ref. Range 11/13/2017 19:53 11/14/2017 04:49  Glucose Latest Ref Range: 65 - 99 mg/dL 60 (L) 103 (H)   Review of Glycemic Control  Diabetes history: DM2 Outpatient Diabetes medications: NPH 25 units BID Current orders for Inpatient glycemic control: None  Inpatient Diabetes Program Recommendations: Correction ([facility]I): While inpatient, please consider ordering CBGs wtih Novolog correction scale ACHS.  Thanks, Barnie Alderman, RN, MSN, CDE Diabetes Coordinator Inpatient Diabetes Program [phone] (Team Pager from 8am to 5pm)

## 2017-11-14 NOTE — H&P (Addendum)
[facility] NAME: [name]    MR#:  [id number]  DATE OF BIRTH:  12/14/1940  DATE OF ADMISSION:  11/13/2017  PRIMARY CARE PHYSICIAN: Glean Hess, MD   REQUESTING/REFERRING PHYSICIAN:   CHIEF COMPLAINT:   Chief Complaint  Patient presents with  . Hematuria    HISTORY OF PRESENT ILLNESS: [name]  is a 77 y.o. male with a known history of colonic cancer, coronary artery disease, diabetes mellitus, hyperlipidemia, hypertension, peripheral vascular disease, CVA presented to the emergency room for evaluation of hematuria.  Patient states he noticed episodes of hematuria with clots around 6 PM yesterday evening.  No complaints of any abdominal pain, flank pain.  Patient had similar episodes in 2018 November he was diagnosed with a renal mass and was removed by Dr. Erlene Quan the mass was benign.  No complaints of any dysuria.  No complaints of any chest pain, shortness of breath.  No fever chills and cough.  PAST MEDICAL HISTORY:   Past Medical History:  Diagnosis Date  . Blood transfusion without reported diagnosis   . Cancer [facility])    Colon  . Complication of anesthesia    raspy voice since carotid endarterectomy 07/17/17  . Coronary artery disease   . Diabetes mellitus without complication ([place])   . Dysrhythmia   . Hyperlipidemia   . Hypertension   . Myocardial infarction ([place]) 1997  . Peripheral vascular disease ([place])   . Pneumonia 2014  . Stroke ([place]) 05/2017  . Thyroid disease     PAST SURGICAL HISTORY:  Past Surgical History:  Procedure Laterality Date  . APPENDECTOMY  2011  . BACK SURGERY    . CARDIAC CATHETERIZATION    . CAROTID ANGIOGRAPHY Right 06/13/2017   Procedure: Right subclavian and Carotid Angiography, possible intervention;  Surgeon: Algernon Huxley, MD;  Location: [facility];  Service: Cardiovascular;  Laterality: Right;  . CHOLECYSTECTOMY N/A 04/23/2016   Procedure: LAPAROSCOPIC  CHOLECYSTECTOMY;  Surgeon: [name], MD;  Location: [facility];  Service: General;  Laterality: N/A;  . COLON SURGERY  2011   Colectomy for ileo-cecal valve cancer  . CORONARY ARTERY BYPASS GRAFT  1997   x 3  . CYSTOSCOPY W/ RETROGRADES Bilateral 09/03/2017   Procedure: CYSTOSCOPY WITH RETROGRADE PYELOGRAM;  Surgeon: Hollice Espy, MD;  Location: [facility];  Service: Urology;  Laterality: Bilateral;  . CYSTOSCOPY WITH STENT PLACEMENT Right 09/03/2017   Procedure: CYSTOSCOPY WITH STENT PLACEMENT;  Surgeon: Hollice Espy, MD;  Location: [facility];  Service: Urology;  Laterality: Right;  . ENDARTERECTOMY Right 07/17/2017   Procedure: ENDARTERECTOMY CAROTID;  Surgeon: Algernon Huxley, MD;  Location: [facility];  Service: Vascular;  Laterality: Right;  . HERNIA REPAIR  2011   Ventral hernia  . HOLMIUM LASER APPLICATION N/A 18/84/1660   Procedure: HOLMIUM LASER APPLICATION;  Surgeon: Hollice Espy, MD;  Location: [facility];  Service: Urology;  Laterality: N/A;  . KNEE SURGERY Left   . KYPHOPLASTY N/A 08/14/2017   Procedure: [id number];  Surgeon: Hessie Knows, MD;  Location: [facility];  Service: Orthopedics;  Laterality: N/A;  . PROSTATE SURGERY  2002   BPH benign pathology  . SPINE SURGERY  1989   Lumbar disc    SOCIAL HISTORY:  Social History   Tobacco Use  . Smoking status: Former Smoker    Types: Cigarettes    Last attempt to quit: 06/24/1996    Years since quitting: 21.4  . Smokeless  tobacco: Never Used  Substance Use Topics  . Alcohol use: No    Alcohol/week: 0.0 oz    FAMILY HISTORY:  Family History  Problem Relation Age of Onset  . Cancer Mother        Lung  . Diabetes Mother   . Heart disease Mother   . Heart disease Father     DRUG ALLERGIES:  Allergies  Allergen Reactions  . Ace Inhibitors Other (See Comments)    Reaction:  Raises potassium   . Quinapril Rash and Other (See Comments)    hyperkalemia    REVIEW OF SYSTEMS:   CONSTITUTIONAL: No fever,  fatigue or weakness.  EYES: No blurred or double vision.  EARS, NOSE, AND THROAT: No tinnitus or ear pain.  RESPIRATORY: No cough, shortness of breath, wheezing or hemoptysis.  CARDIOVASCULAR: No chest pain, orthopnea, edema.  GASTROINTESTINAL: No nausea, vomiting, diarrhea or abdominal pain.  GENITOURINARY: No dysuria,  Has hematuria.  ENDOCRINE: No polyuria, nocturia,  HEMATOLOGY: No anemia, easy bruising or bleeding SKIN: No rash or lesion. MUSCULOSKELETAL: No joint pain or arthritis.   NEUROLOGIC: No tingling, numbness, weakness.  PSYCHIATRY: No anxiety or depression.   MEDICATIONS AT HOME:  Prior to Admission medications   Medication Sig Start Date End Date Taking? Authorizing Provider  acetaminophen (TYLENOL) 500 MG tablet Take 1,000 mg every 6 (six) hours as needed by mouth for moderate pain or headache.   Yes [provider]  aspirin EC 81 MG EC tablet Take 1 tablet (81 mg total) by mouth daily. 07/19/17  Yes Dew, Erskine Squibb, MD  atenolol (TENORMIN) 50 MG tablet Take 1 tablet (50 mg total) by mouth daily. 08/08/17  Yes Glean Hess, MD  atorvastatin (LIPITOR) 40 MG tablet Take 1 tablet (40 mg total) daily at 6 PM by mouth. 08/28/17  Yes Glean Hess, MD  clopidogrel (PLAVIX) 75 MG tablet Take 1 tablet (75 mg total) by mouth daily. 08/08/17  Yes Glean Hess, MD  insulin NPH Human (HUMULIN N,NOVOLIN N) 100 UNIT/ML injection Inject 0.25 mLs (25 Units total) into the skin 2 (two) times daily. 08/15/17  Yes Vaughan Basta, MD  insulin regular (NOVOLIN R,HUMULIN R) 100 units/mL injection Inject 10-15 Units See admin instructions into the skin. Inject 15 units SQ in the morning and inject 10 units SQ with supper   Yes [provider]  levothyroxine (SYNTHROID, LEVOTHROID) 300 MCG tablet Take 300 mcg by mouth daily before breakfast.   Yes [provider]  sodium bicarbonate 650 MG tablet Take 650 mg 2 (two) times daily by mouth.  04/28/17 04/28/18  Yes [provider]  tamsulosin (FLOMAX) 0.4 MG CAPS capsule Take 1 capsule (0.4 mg total) by mouth daily. 10/08/17  Yes Nickie Retort, MD  docusate sodium (COLACE) 100 MG capsule Take 1 capsule (100 mg total) by mouth 2 (two) times daily. Patient not taking: Reported on 10/31/2017 08/15/17   Vaughan Basta, MD  HYDROcodone-acetaminophen (NORCO/VICODIN) 5-325 MG tablet Take 1-2 tablets by mouth every 6 (six) hours as needed for moderate pain. Patient not taking: Reported on 10/31/2017 09/03/17   Hollice Espy, MD  polyethylene glycol [facility] / Floria Raveling) packet Take 17 g by mouth daily as needed for mild constipation. Patient not taking: Reported on 10/31/2017 08/15/17   Vaughan Basta, MD  tamsulosin [facility]) 0.4 MG CAPS capsule Take 1 capsule (0.4 mg total) by mouth daily after breakfast. Patient not taking: Reported on 10/31/2017 08/16/17   Vaughan Basta, MD  PHYSICAL EXAMINATION:   VITAL SIGNS: Blood pressure (!) 172/54, pulse 74, temperature (!) 97.4 F (36.3 C), temperature source Oral, resp. rate (!) 27, height 6\' 2"  (1.88 m), weight 95.3 kg (210 lb), SpO2 99 %.  GENERAL:  77 y.o.-year-old patient lying in the bed with no acute distress.  EYES: Pupils equal, round, reactive to light and accommodation. No scleral icterus. Extraocular muscles intact.  HEENT: Head atraumatic, normocephalic. Oropharynx and nasopharynx clear.  NECK:  Supple, no jugular venous distention. No thyroid enlargement, no tenderness.  LUNGS: Normal breath sounds bilaterally, no wheezing, rales,rhonchi or crepitation. No use of accessory muscles of respiration.  CARDIOVASCULAR: S1, S2 normal. No murmurs, rubs, or gallops.  ABDOMEN: Soft, nontender, nondistended. Bowel sounds present. No organomegaly or mass.  EXTREMITIES: No pedal edema, cyanosis, or clubbing.  NEUROLOGIC: Cranial nerves II through XII are intact. Muscle strength 5/5 in all extremities. Sensation intact. Gait  not checked.  PSYCHIATRIC: The patient is alert and oriented x 3.  SKIN: No obvious rash, lesion, or ulcer.   LABORATORY PANEL:   CBC Recent Labs  Lab 11/13/17 1953  WBC 8.7  HGB 12.5*  HCT 38.3*  PLT 134*  MCV 93.6  MCH 30.7  MCHC 32.8  RDW 15.9*  LYMPHSABS 3.3  MONOABS 0.8  EOSABS 0.3  BASOSABS 0.1   ------------------------------------------------------------------------------------------------------------------  Chemistries  Recent Labs  Lab 11/13/17 1953  NA 140  K 5.6*  CL 112*  CO2 19*  GLUCOSE 60*  BUN 46*  CREATININE 3.41*  CALCIUM 8.8*   ------------------------------------------------------------------------------------------------------------------ estimated creatinine clearance is 21.1 mL/min (A) (by C-G formula based on SCr of 3.41 mg/dL (H)). ------------------------------------------------------------------------------------------------------------------ No results for input(s): TSH, T4TOTAL, T3FREE, THYROIDAB in the last 72 hours.  Invalid input(s): FREET3   Coagulation profile Recent Labs  Lab 11/13/17 1953  INR 1.07   ------------------------------------------------------------------------------------------------------------------- No results for input(s): DDIMER in the last 72 hours. -------------------------------------------------------------------------------------------------------------------  Cardiac Enzymes No results for input(s): CKMB, TROPONINI, MYOGLOBIN in the last 168 hours.  Invalid input(s): CK ------------------------------------------------------------------------------------------------------------------ Invalid input(s): POCBNP  ---------------------------------------------------------------------------------------------------------------  Urinalysis    Component Value Date/Time   COLORURINE RED (A) 11/13/2017 2022   APPEARANCEUR TURBID (A) 11/13/2017 2022   LABSPEC 1.015 11/13/2017 2022   PHURINE   11/13/2017 2022    TEST NOT REPORTED DUE TO COLOR INTERFERENCE OF URINE PIGMENT   GLUCOSEU (A) 11/13/2017 2022    TEST NOT REPORTED DUE TO COLOR INTERFERENCE OF URINE PIGMENT   HGBUR (A) 11/13/2017 2022    TEST NOT REPORTED DUE TO COLOR INTERFERENCE OF URINE PIGMENT   BILIRUBINUR (A) 11/13/2017 2022    TEST NOT REPORTED DUE TO COLOR INTERFERENCE OF URINE PIGMENT   KETONESUR (A) 11/13/2017 2022    TEST NOT REPORTED DUE TO COLOR INTERFERENCE OF URINE PIGMENT   PROTEINUR (A) 11/13/2017 2022    TEST NOT REPORTED DUE TO COLOR INTERFERENCE OF URINE PIGMENT   NITRITE (A) 11/13/2017 2022    TEST NOT REPORTED DUE TO COLOR INTERFERENCE OF URINE PIGMENT   LEUKOCYTESUR (A) 11/13/2017 2022    TEST NOT REPORTED DUE TO COLOR INTERFERENCE OF URINE PIGMENT     RADIOLOGY: Ct Renal Stone Study  Result Date: 11/13/2017 CLINICAL DATA:  Hematuria EXAM: CT ABDOMEN AND PELVIS WITHOUT CONTRAST TECHNIQUE: Multidetector CT imaging of the abdomen and pelvis was performed following the standard protocol without IV contrast. COMPARISON:  08/10/2017 FINDINGS: Lower chest: Heart size upper normal. Coronary artery calcification is evident. Hepatobiliary: No focal abnormality in the liver on this study  without intravenous contrast. Gallbladder surgically absent. No intrahepatic or extrahepatic biliary dilation. Pancreas: No focal mass lesion. No dilatation of the main duct. No intraparenchymal cyst. No peripancreatic edema. Spleen: No splenomegaly. No focal mass lesion. Adrenals/Urinary Tract: No adrenal nodule or mass. Kidneys appear atrophic bilaterally. Prominent right extrarenal pelvis again noted. The hyperattenuating dependent opacity in the right renal pelvis on the previous study is no longer evident. There is right ureteral fullness down to the level of the UVJ without etiology for the ureteral distention. Left ureter unremarkable. Mild bladder wall thickening is associated with bladder diverticuli. Stomach/Bowel:  Stomach is nondistended. No gastric wall thickening. No evidence of outlet obstruction. Duodenum is normally positioned as is the ligament of Treitz. Duodenal diverticulum evident. No small bowel wall thickening. No small bowel dilatation. Prior right hemicolectomy. 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 Vascular/Lymphatic: There is abdominal aortic atherosclerosis without aneurysm. There is no gastrohepatic or hepatoduodenal ligament lymphadenopathy. No intraperitoneal or retroperitoneal lymphadenopathy. No pelvic sidewall lymphadenopathy. Reproductive: Prostate gland surgically absent. Other: No intraperitoneal free fluid. Musculoskeletal: Patient is status post vertebral augmentation at T12. IMPRESSION: 1. Mild right hydroureteronephrosis without definable etiology. Hyperattenuating lesion in the right renal pelvis on the previous study is no longer evident. 2. 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 3.  Aortic Atherosclerois (ICD10-170.0) 4. Right hemicolectomy. Electronically Signed   By: Misty Stanley M.D.   On: 11/13/2017 20:39    EKG: Orders placed or performed during the hospital encounter of 11/13/17  . ED EKG  . ED EKG    IMPRESSION AND PLAN:  77 year old male patient with history of colon cancer, hypertension, hyperlipidemia, peripheral vascular disease, CVA presented to the emergency room with hematuria.  Admitting diagnosis 1.  Hematuria 2.  Hyperkalemia 3.  Hypertension 4.  Hyperlipidemia 5.  Peripheral vascular disease Treatment plan Admit patient to medical floor Monitor hemoglobin hematocrit Hold aspirin and Plavix Urology consultation Follow potassium level Patient received sodium bicarbonate, insulin and dextrose for elevated potassium in the emergency room.   All the records are reviewed and case discussed with ED provider. Management plans discussed  with the patient, family and they are in agreement.  CODE STATUS: Full code Code Status History    Date Active Date Inactive Code Status Order ID Comments User Context   08/11/2017 02:00 08/15/2017 17:41 DNR [id number]  Gorden Harms, MD Inpatient   07/17/2017 18:37 07/18/2017 18:55 Full Code [id number]  Algernon Huxley, MD Inpatient   06/11/2017 22:31 06/14/2017 16:38 Full Code [id number]  Harvie Bridge, DO Inpatient   12/25/2016 15:31 12/26/2016 16:31 DNR [id number]  Hillary Bow, MD ED   04/23/2016 13:20 04/26/2016 22:17 Full Code [id number]  Jeanie Cooks, MD Inpatient   04/21/2016 02:13 04/23/2016 13:20 Full Code [id number]  Gonczy, [place], MD ED    Questions for Most Recent Historical Code Status (Order [id number])    Question Answer Comment   In the event of cardiac or respiratory ARREST Do not call a "code blue"    In the event of cardiac or respiratory ARREST Do not perform Intubation, CPR, defibrillation or ACLS    In the event of cardiac or respiratory ARREST Use medication by any route, position, wound care, and other measures to relive pain and suffering. May use oxygen, suction and manual treatment of airway obstruction as needed for comfort.        TOTAL TIME TAKING CARE OF THIS PATIENT:  50 minutes.    Saundra Shelling M.D on 11/14/2017 at 1:59 AM  Between 7am to 6pm - Pager - [phone]  After 6pm go to [email or web address] - password [email or web address] [facility]  [place] [facility]  Office  [phone]  CC: Primary care physician; Glean Hess, MD

## 2017-11-14 NOTE — Care Management Important Message (Signed)
Important Message  Patient Details  Name: AVYUKT CIMO MRN: [id number] Date of Birth: 1941/03/08   Medicare Important Message Given:  Yes    Beverly Sessions, RN 11/14/2017, 3:42 PM

## 2017-11-15 DIAGNOSIS — I10 Essential (primary) hypertension: Secondary | ICD-10-CM | POA: Diagnosis not present

## 2017-11-15 DIAGNOSIS — E785 Hyperlipidemia, unspecified: Secondary | ICD-10-CM | POA: Diagnosis not present

## 2017-11-15 DIAGNOSIS — R319 Hematuria, unspecified: Secondary | ICD-10-CM | POA: Diagnosis not present

## 2017-11-15 DIAGNOSIS — S3739XA Other injury of urethra, initial encounter: Secondary | ICD-10-CM | POA: Diagnosis not present

## 2017-11-15 DIAGNOSIS — E875 Hyperkalemia: Secondary | ICD-10-CM | POA: Diagnosis not present

## 2017-11-15 LAB — BASIC METABOLIC PANEL
ANION GAP: 8 (ref 5–15)
BUN: 40 mg/dL — ABNORMAL HIGH (ref 6–20)
CHLORIDE: 113 mmol/L — AB (ref 101–111)
CO2: 18 mmol/L — ABNORMAL LOW (ref 22–32)
Calcium: 8.6 mg/dL — ABNORMAL LOW (ref 8.9–10.3)
Creatinine, Ser: 2.96 mg/dL — ABNORMAL HIGH (ref 0.61–1.24)
GFR, EST AFRICAN AMERICAN: 22 mL/min — AB (ref >60)
GFR, EST NON AFRICAN AMERICAN: 19 mL/min — AB (ref >60)
Glucose, Bld: 150 mg/dL — ABNORMAL HIGH (ref 65–99)
Potassium: 5.6 mmol/L — ABNORMAL HIGH (ref 3.5–5.1)
SODIUM: 139 mmol/L (ref 135–145)

## 2017-11-15 LAB — POTASSIUM: POTASSIUM: 5 mmol/L (ref 3.5–5.1)

## 2017-11-15 MED ORDER — SODIUM POLYSTYRENE SULFONATE 15 GM/60ML PO SUSP
30.0000 g | Freq: Once | ORAL | Status: AC
  Administered 2017-11-15: 30 g via ORAL
  Filled 2017-11-15: qty 120

## 2017-11-15 MED ORDER — HYDRALAZINE HCL 20 MG/ML IJ SOLN
10.0000 mg | Freq: Four times a day (QID) | INTRAMUSCULAR | Status: DC | PRN
  Administered 2017-11-15: 10 mg via INTRAVENOUS
  Filled 2017-11-15: qty 1

## 2017-11-15 NOTE — Progress Notes (Signed)
MD ordered patient to be discharged home.  Discharge instructions were reviewed with the patient and he voiced understanding.  Patient instructed on making the follow-up appointment.  No prescriptions given to the patient.  IV was removed with catheter intact.  Patient asked for the leg bag for the foley.  I gave him a standard foley bag and leg bag.  He is very familiar on how to change it.  All patients questions were answered.  Patient leaving via wheelchair escorted by NT.

## 2017-11-15 NOTE — Discharge Summary (Signed)
[facility] [place] NAME: [name]    MR#:  [id number]  DATE OF BIRTH:  10/24/1940  DATE OF ADMISSION:  11/13/2017 ADMITTING PHYSICIAN: Saundra Shelling, MD  DATE OF DISCHARGE: 11/15/17  PRIMARY CARE PHYSICIAN: Glean Hess, MD    ADMISSION DIAGNOSIS:  Hyperkalemia [E87.5] Urinary retention [R33.9] Gross hematuria [R31.0] Current use of long term anticoagulation [Z79.01] Chronic kidney disease, unspecified CKD stage [N18.9]  DISCHARGE DIAGNOSIS:  Hematuria appears traumatic due to self catheterization---Now has foley Hyperkalemia SECONDARY DIAGNOSIS:   Past Medical History:  Diagnosis Date  . Blood transfusion without reported diagnosis   . Cancer [facility])    Colon  . Complication of anesthesia    raspy voice since carotid endarterectomy 07/17/17  . Coronary artery disease   . Diabetes mellitus without complication ([place])   . Dysrhythmia   . Hyperlipidemia   . Hypertension   . Myocardial infarction ([place]) 1997  . Peripheral vascular disease ([place])   . Pneumonia 2014  . Stroke ([place]) 05/2017  . Thyroid disease     HOSPITAL COURSE:  77 year old male with past medical history of essential hypertension, hyperlipidemia, peripheral vascular disease, diabetes, chronic kidney disease stage III, history of urinary retention who presented to the hospital secondary to hematuria and noted to be in acute on chronic renal failure.  1. Hematuria-patient presented to the hospital with gross hematuria which has improved. Patient's hemoglobin is stable.  -Seen by urology and patient has a previous history of urinary retention and neurogenic bladder and recently underwent right ureteroscopy with removal of a right-sided renal pelvic mass which was not malignant. Patient is on Plavix which is contributing to the patient's hematuria which is currently being held. -Urology thinks this is most likely trauma from patient doing self  catheterizations and being on Plavix. -Continue Foley catheter and patient be discharged on that with follow-up with urology as an outpatient. -Continue Flomax. -PLAVIX on hold till hematuria w/u completed  2. Acute on chronic renal failure-secondary to hematuria and urinary retention. -Status post Foley catheter placement.  creatinine is improving.  3. Hyperkalemia-secondary to the acute on chronic kidney injury. -Continue IV fluids, will give a dose of Kayexalate. Repeat potassium 5.0  4. Essential hypertension-continue atenolol.  5. Hypothyroidism-continue Synthroid.  6. Hyperlipidemia-continue atorvastatin.   Overall stable D/c home with foley and out pt f/u with Dr Pilar Jarvis  CONSULTS OBTAINED:  Treatment Team:  Abbie Sons, MD  DRUG ALLERGIES:   Allergies  Allergen Reactions  . Ace Inhibitors Other (See Comments)    Reaction:  Raises potassium   . Quinapril Rash and Other (See Comments)    hyperkalemia    DISCHARGE MEDICATIONS:   Allergies as of 11/15/2017      Reactions   Ace Inhibitors Other (See Comments)   Reaction:  Raises potassium    Quinapril Rash, Other (See Comments)   hyperkalemia      Medication List    STOP taking these medications   clopidogrel 75 MG tablet Commonly known as:  PLAVIX   HYDROcodone-acetaminophen 5-325 MG tablet Commonly known as:  NORCO/VICODIN   polyethylene glycol packet Commonly known as:  MIRALAX / GLYCOLAX     TAKE these medications   acetaminophen 500 MG tablet Commonly known as:  TYLENOL Take 1,000 mg every 6 (six) hours as needed by mouth for moderate pain or headache.   aspirin 81 MG EC tablet Take 1 tablet (81 mg total) by mouth daily.  atenolol 50 MG tablet Commonly known as:  TENORMIN Take 1 tablet (50 mg total) by mouth daily.   atorvastatin 40 MG tablet Commonly known as:  LIPITOR Take 1 tablet (40 mg total) daily at 6 PM by mouth.   docusate sodium 100 MG capsule Commonly known as:   COLACE Take 1 capsule (100 mg total) by mouth 2 (two) times daily.   insulin NPH Human 100 UNIT/ML injection Commonly known as:  HUMULIN N,NOVOLIN N Inject 0.25 mLs (25 Units total) into the skin 2 (two) times daily.   insulin regular 100 units/mL injection Commonly known as:  NOVOLIN R,HUMULIN R Inject 10-15 Units See admin instructions into the skin. Inject 15 units SQ in the morning and inject 10 units SQ with supper   levothyroxine 300 MCG tablet Commonly known as:  SYNTHROID, LEVOTHROID Take 300 mcg by mouth daily before breakfast.   sodium bicarbonate 650 MG tablet Take 650 mg 2 (two) times daily by mouth.   tamsulosin 0.4 MG Caps capsule Commonly known as:  FLOMAX Take 1 capsule (0.4 mg total) by mouth daily. What changed:  Another medication with the same name was removed. Continue taking this medication, and follow the directions you see here.       If you experience worsening of your admission symptoms, develop shortness of breath, life threatening emergency, suicidal or homicidal thoughts you must seek medical attention immediately by calling 911 or calling your MD immediately  if symptoms less severe.  You Must read complete instructions/literature along with all the possible adverse reactions/side effects for all the Medicines you take and that have been prescribed to you. Take any new Medicines after you have completely understood and accept all the possible adverse reactions/side effects.   Please note  You were cared for by a hospitalist during your hospital stay. If you have any questions about your discharge medications or the care you received while you were in the hospital after you are discharged, you can call the unit and asked to speak with the hospitalist on call if the hospitalist that took care of you is not available. Once you are discharged, your primary care physician will handle any further medical issues. Please note that NO REFILLS for any discharge  medications will be authorized once you are discharged, as it is imperative that you return to your primary care physician (or establish a relationship with a primary care physician if you do not have one) for your aftercare needs so that they can reassess your need for medications and monitor your lab values. Today   SUBJECTIVE   Now new complaints  Foley draining yellow urine  VITAL SIGNS:  Blood pressure (!) 156/72, pulse 64, temperature 98.1 F (36.7 C), temperature source Oral, resp. rate (!) 22, height 6\' 2"  (1.88 m), weight 95.3 kg (210 lb), SpO2 95 %.  I/O:    Intake/Output Summary (Last 24 hours) at 11/15/2017 1644 Last data filed at 11/15/2017 1434 Gross per 24 hour  Intake 1753.5 ml  Output 1625 ml  Net 128.5 ml    PHYSICAL EXAMINATION:  GENERAL:  77 y.o.-year-old patient lying in the bed with no acute distress.  EYES: Pupils equal, round, reactive to light and accommodation. No scleral icterus. Extraocular muscles intact.  HEENT: Head atraumatic, normocephalic. Oropharynx and nasopharynx clear.  NECK:  Supple, no jugular venous distention. No thyroid enlargement, no tenderness.  LUNGS: Normal breath sounds bilaterally, no wheezing, rales,rhonchi or crepitation. No use of accessory muscles of respiration.  CARDIOVASCULAR:  S1, S2 normal. No murmurs, rubs, or gallops.  ABDOMEN: Soft, non-tender, non-distended. Bowel sounds present. No organomegaly or mass. Foley+ EXTREMITIES: No pedal edema, cyanosis, or clubbing.  NEUROLOGIC: Cranial nerves II through XII are intact. Muscle strength 5/5 in all extremities. Sensation intact. Gait not checked.  PSYCHIATRIC: The patient is alert and oriented x 3.  SKIN: No obvious rash, lesion, or ulcer.   DATA REVIEW:   CBC  Recent Labs  Lab 11/14/17 0449  WBC 8.3  HGB 11.4*  HCT 34.9*  PLT 124*    Chemistries  Recent Labs  Lab 11/15/17 0604 11/15/17 1556  NA 139  --   K 5.6* 5.0  CL 113*  --   CO2 18*  --   GLUCOSE 150*   --   BUN 40*  --   CREATININE 2.96*  --   CALCIUM 8.6*  --     Microbiology Results   No results found for this or any previous visit (from the past 240 hour(s)).  RADIOLOGY:  Ct Renal Stone Study  Result Date: 11/13/2017 CLINICAL DATA:  Hematuria EXAM: CT ABDOMEN AND PELVIS WITHOUT CONTRAST TECHNIQUE: Multidetector CT imaging of the abdomen and pelvis was performed following the standard protocol without IV contrast. COMPARISON:  08/10/2017 FINDINGS: Lower chest: Heart size upper normal. Coronary artery calcification is evident. Hepatobiliary: No focal abnormality in the liver on this study without intravenous contrast. Gallbladder surgically absent. No intrahepatic or extrahepatic biliary dilation. Pancreas: No focal mass lesion. No dilatation of the main duct. No intraparenchymal cyst. No peripancreatic edema. Spleen: No splenomegaly. No focal mass lesion. Adrenals/Urinary Tract: No adrenal nodule or mass. Kidneys appear atrophic bilaterally. Prominent right extrarenal pelvis again noted. The hyperattenuating dependent opacity in the right renal pelvis on the previous study is no longer evident. There is right ureteral fullness down to the level of the UVJ without etiology for the ureteral distention. Left ureter unremarkable. Mild bladder wall thickening is associated with bladder diverticuli. Stomach/Bowel: Stomach is nondistended. No gastric wall thickening. No evidence of outlet obstruction. Duodenum is normally positioned as is the ligament of Treitz. Duodenal diverticulum evident. No small bowel wall thickening. No small bowel dilatation. Prior right hemicolectomy. 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 Vascular/Lymphatic: There is abdominal aortic atherosclerosis without aneurysm. There is no gastrohepatic or hepatoduodenal ligament lymphadenopathy. No intraperitoneal or retroperitoneal lymphadenopathy. No pelvic sidewall  lymphadenopathy. Reproductive: Prostate gland surgically absent. Other: No intraperitoneal free fluid. Musculoskeletal: Patient is status post vertebral augmentation at T12. IMPRESSION: 1. Mild right hydroureteronephrosis without definable etiology. Hyperattenuating lesion in the right renal pelvis on the previous study is no longer evident. 2. 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 3.  Aortic Atherosclerois (ICD10-170.0) 4. Right hemicolectomy. Electronically Signed   By: Misty Stanley M.D.   On: 11/13/2017 20:39     Management plans discussed with the patient, family and they are in agreement.  CODE STATUS:     Code Status Orders  (From admission, onward)        Start     Ordered   11/14/17 0327  Do not attempt resuscitation (DNR)  Continuous    Question Answer Comment  In the event of cardiac or respiratory ARREST Do not call a "code blue"   In the event of cardiac or respiratory ARREST Do not perform Intubation, CPR, defibrillation or ACLS   In the event of cardiac or respiratory ARREST Use medication  by any route, position, wound care, and other measures to relive pain and suffering. May use oxygen, suction and manual treatment of airway obstruction as needed for comfort.      11/14/17 0327    Code Status History    Date Active Date Inactive Code Status Order ID Comments User Context   08/11/2017 02:00 08/15/2017 17:41 DNR [id number]  Gorden Harms, MD Inpatient   07/17/2017 18:37 07/18/2017 18:55 Full Code [id number]  Algernon Huxley, MD Inpatient   06/11/2017 22:31 06/14/2017 16:38 Full Code [id number]  Harvie Bridge, DO Inpatient   12/25/2016 15:31 12/26/2016 16:31 DNR [id number]  Hillary Bow, MD ED   04/23/2016 13:20 04/26/2016 22:17 Full Code [id number]  Jeanie Cooks, MD Inpatient   04/21/2016 02:13 04/23/2016 13:20 Full Code [id number]  Gonczy, [place], MD ED      TOTAL TIME TAKING CARE OF THIS PATIENT: 40 minutes.    Fritzi Mandes  M.D on 11/15/2017 at 4:44 PM  Between 7am to 6pm - Pager - [phone] After 6pm go to [email or web address] - password [email or web address] [facility]  Office  [phone]  CC: Primary care physician; Glean Hess, MD

## 2017-11-15 NOTE — Progress Notes (Signed)
Called Dr. Posey Pronto to let her know patient's BP is elevated.  She gave me a verbal order for IV Hydralazine.

## 2017-11-17 ENCOUNTER — Other Ambulatory Visit: Payer: Self-pay | Admitting: *Deleted

## 2017-11-17 ENCOUNTER — Encounter: Payer: Self-pay | Admitting: *Deleted

## 2017-11-17 ENCOUNTER — Telehealth: Payer: Self-pay

## 2017-11-17 NOTE — Telephone Encounter (Signed)
Discharge summary reviewed and discussed with Dr. Army Melia. Because pt is followed by Nephrology and Urology on a routine basis, Dr. Army Melia states it is unnecessary for her to see this pt for a hosp f/u appt. Care for hematuria will be assumed by Dr. Pilar Jarvis and Dr. Johnney Ou. In light of our conversation, a TCM call was not placed.

## 2017-11-17 NOTE — Patient Outreach (Signed)
[facility]) Care Management  [place]  11/17/2017   [name] Mar 05, 1941 [id number]    Transition of care call Patient with recent [facility] admission 1/31-2/2 for hematuria and hyperkalemia.  77 year old male with PMH of Diabetes, hypertension, CKD, urinary retention ,CVA, carotid endarterectomy, hyperlipemia,   Subjective:  Successful outreach call to patient, spoke with his wife [name], HIPAA information confirmed, patient is on speaker phone.  Patient and wife discussed recent admission related to hematuria .  Patient discussed having foley catheter back in urine is clear, reports he will have place in place until 2/8. Patient has follow up visit with Dr.Budzyn on 2/8.  Discussed wife scheduling post hospital office visit with Dr.Berguland PCP, per discharge instructions.   Patient reports he is taking medications as prescribed, verbalizes he is off Plavix again until follow up MD and urinary problems addressed. Patient was recently discharged from hospital and all medications have been reviewed.  Wife reports patient is eating and drinking as usual, patient reports low blood sugar this morning reading of 66, reports he ate a snack, but did not recheck blood sugar. Discussed with patient importance of monitoring and rechecking blood sugar after having a snack for low blood sugar treatment .     Encounter Medications:  Outpatient Encounter Medications as of 11/17/2017  Medication Sig Note  . acetaminophen (TYLENOL) 500 MG tablet Take 1,000 mg every 6 (six) hours as needed by mouth for moderate pain or headache.   Marland Kitchen aspirin EC 81 MG EC tablet Take 1 tablet (81 mg total) by mouth daily.   Marland Kitchen atenolol (TENORMIN) 50 MG tablet Take 1 tablet (50 mg total) by mouth daily.   Marland Kitchen atorvastatin (LIPITOR) 40 MG tablet Take 1 tablet (40 mg total) daily at 6 PM by mouth.   . insulin NPH Human (HUMULIN N,NOVOLIN N) 100 UNIT/ML injection Inject 0.25 mLs (25 Units total) into  the skin 2 (two) times daily.   . insulin regular (NOVOLIN R,HUMULIN R) 100 units/mL injection Inject 10-15 Units See admin instructions into the skin. Inject 15 units SQ in the morning and inject 10 units SQ with supper 10/31/2017: Reports taking 12 units   . levothyroxine (SYNTHROID, LEVOTHROID) 300 MCG tablet Take 300 mcg by mouth daily before breakfast.   . sodium bicarbonate 650 MG tablet Take 650 mg 2 (two) times daily by mouth.    . tamsulosin (FLOMAX) 0.4 MG CAPS capsule Take 1 capsule (0.4 mg total) by mouth daily.   Marland Kitchen docusate sodium (COLACE) 100 MG capsule Take 1 capsule (100 mg total) by mouth 2 (two) times daily. (Patient not taking: Reported on 10/31/2017)    No facility-administered encounter medications on file as of 11/17/2017.     Functional Status:  In your present state of health, do you have any difficulty performing the following activities: 11/17/2017 11/14/2017  Hearing? N N  Vision? N N  Difficulty concentrating or making decisions? N N  Walking or climbing stairs? Y N  Comment takes few steps slowly -  Dressing or bathing? N N  Comment - -  Doing errands, shopping? N [place] and eating ? Y -  Comment wife helps  -  Using the Toilet? N -  In the past six months, have you accidently leaked urine? N -  Comment has foley -  Do you have problems with loss of bowel control? N -  Managing your Medications? Y -  Comment  wife assist  -  Managing your Finances? N -  Housekeeping or managing your Housekeeping? Y -  Comment wife available to assist  -  Some recent data might be hidden    Fall/Depression Screening: Fall Risk  11/17/2017 08/20/2017 07/31/2017  Falls in the past year? Yes Yes Yes  Number falls in past yr: 1 1 1   Injury with Fall? Yes Yes Yes  Risk Factor Category  - High Fall Risk High Fall Risk  Risk for fall due to : History of fall(s) History of fall(s);Impaired balance/gait History of fall(s);Impaired balance/gait;Impaired mobility   Follow up - Falls prevention discussed Falls evaluation completed;Falls prevention discussed;Education provided   [facility] 2[facility]9 Scores 11/17/2017 09/10/2017 07/31/2017 06/20/2017 07/22/2016 03/19/2016 03/16/2015  PHQ - 2 Score 0 0 0 0 0 0 0      Plan:  Patient will remain active with transition of care program and weekly outreaches, home visit scheduled in the next week.    THN CM Care Plan Problem One     Most Recent Value  Care Plan Problem One  Recent hospital admission related to hematuria   Role Documenting the Problem One  Care Management [place] for Problem One  Active  THN Long Term Goal   Patient will not experience a hospital admission in the next 31 days   THN Long Term Goal Start Date  11/17/17  Interventions for Problem One Long Term Goal  Reviewed discharge instructions and advised regarding adhering to medication plan and reinforced follow up with MD in noted changes in urine color or noted pain, reinforced keeping foley bag , off the floor , but hanging below bladder level and rationale.   THN CM Short Term Goal #1   Patient will report attending all medical appointment in the next 21 days   THN CM Short Term Goal #1 Start Date  11/17/17  Interventions for Short Term Goal #1  Advised regarding prompt follow up with PCP after discharge from the hospital , and keep all scheduled urology appointments, verifed patient has  transportation  [facility] CM Short Term Goal #2   Patient will report continuing to monitor blood sugars daily, keeping a record in the next 30 days   THN CM Short Term Goal #2 Start Date  10/20/17  Interventions for Short Term Goal #2  Discussed with patient current monitoring pattern, positive reinforcement toward working on goal   [facility] CM Short Term Goal #3  Patient will be able to recall steps of treating low blood sugar in the next 30 days   THN CM Short Term Goal #3 Start Date  11/17/17  Interventions for Short Tern Goal #3  Reviewed with patient steps in  treating low blood sugar, reinforced rechecking after treating , keep a record of low reading and notify MD of increased episodes..   THN CM Short Term Goal #4  Patient will be able to perform CIC as instructed by urology in the next 14 days   THN CM Short Term Goal #4 Start Date  11/06/17  Interventions for Short Term Goal #4  RNCM placed call to urology office to discuss patient instructions on schedule for catheterizations , reviewed clean catheterization procedure with teachback. , discussed the significance of catheterizing to make sure not hold excess urine in bladder after urinating.       [name], RN, [place] Management Coordinator  [phone]- Mobile [phone]- Toll Free Main Office

## 2017-11-18 ENCOUNTER — Encounter: Payer: Self-pay | Admitting: *Deleted

## 2017-11-18 ENCOUNTER — Ambulatory Visit
Admission: RE | Admit: 2017-11-18 | Discharge: 2017-11-18 | Disposition: A | Discharge status: Home / Self Care | Payer: PPO | Source: Ambulatory Visit | Attending: Urology | Admitting: Urology

## 2017-11-18 DIAGNOSIS — N1330 Unspecified hydronephrosis: Secondary | ICD-10-CM | POA: Insufficient documentation

## 2017-11-18 DIAGNOSIS — N2889 Other specified disorders of kidney and ureter: Secondary | ICD-10-CM

## 2017-11-21 ENCOUNTER — Encounter: Payer: Self-pay | Admitting: Urology

## 2017-11-21 ENCOUNTER — Ambulatory Visit: Payer: PPO | Admitting: Urology

## 2017-11-21 VITALS — BP 118/63 | HR 63 | Ht 74.0 in | Wt 215.7 lb

## 2017-11-21 DIAGNOSIS — R339 Retention of urine, unspecified: Secondary | ICD-10-CM

## 2017-11-21 DIAGNOSIS — N1330 Unspecified hydronephrosis: Secondary | ICD-10-CM

## 2017-11-21 NOTE — Progress Notes (Signed)
11/21/2017 11:43 AM   [name] 10/27/1940 [id number]  Referring provider: Glean Hess, MD [address] [place], [place] [address]  Chief Complaint  Patient presents with  . Follow-up    Renal US results    HPI: 77 year old white male to discus UDS results for follow up. He does have a history of simple prostatectomy and his prostatic fossa was wide open at the time of recent ureteroscopy.   Previous history is discussed below.  He was started on CIC at his last visit.  His recent him into the hospital and neurology was consulted for gross hematuria after catheterization.  This is happened before as described below.  A Foley catheter was placed.  At that time was noted to have right hydroureteronephrosis.  He did not have a catheter in that CT scan.  A Foley catheter was placed and he was discharged.  Follow-up a renal ultrasound shows persistence of this hydronephrosis despite bladder decompression.  The patient was performing CIC.  However he noted that he got no more than 1 ounce out whenever he performed a postvoid CIC.  He was voiding well.  His only concern was episode of gross hematuria and the hydroureteronephrosis seen on recent CT.   Background history: He underwent a right carotid endarterectomy at the beginning of theOctober 2018and then fell resulting in a fracture of T12 a few weeks ago. He had progressive weak stream and hesitancy and feeling of incomplete emptying. He underwent CT scan which showed a distended bladder and small bilateral bladder diverticula and a small prostate. He also had acute kidney injury which looked to be prerenal. Baseline creatinine is around 2.8 or 3. Patient reports what sounds like an open simple prostatectomy in the mid 90s which is likely why the prostate looks small on the CT yet he has diverticula. A Foley catheter was placed and he is made good urine now that he has been resuscitated. He is on  tamsulosin.  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 Patient recalls gross hematuria after starting Plavix following the carotid endarterectomy earlier this month. Urine was red. He has not had clots. Urine is light pink currently.  He presents today for a trial of void also to discuss right ureteroscopy for further evaluation of the right renal pelvic mass.  This turned out to be a stone versus fungal ball. It was not a malignancy.  Stone analysis appeared consistent with blood material with a few crystals too small to identify.    UDS Results: Maximum capacity 381 cc First sensation 368 cc Max unstable detrusor contracture 9 cm of water First unstable contraction occurred at 381 cc Maximum flow rate was 12 cc/s Detrusor pressure max flow rate was 13 cm of water Max detrusor pressure of 14 cm of water PVR 65 cc The patient did technically pass his UDS trial of void, but his detrusor pressures were very weak.     PMH: Past Medical History:  Diagnosis Date  . Blood transfusion without reported diagnosis   . 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 Complication of anesthesia    raspy voice since carotid endarterectomy 07/17/17  . Coronary artery disease   . Diabetes mellitus without complication ([place])   . Dysrhythmia   . Hyperlipidemia   . Hypertension   . Myocardial infarction ([place]) 1997  . Peripheral vascular disease ([place])   . Pneumonia 2014  . Stroke ([place]) 05/2017  .  Thyroid disease     Surgical History: Past Surgical History:  Procedure Laterality Date  . APPENDECTOMY  2011  . BACK SURGERY    . CARDIAC CATHETERIZATION    . CAROTID ANGIOGRAPHY Right 06/13/2017   Procedure: Right subclavian and Carotid Angiography, possible intervention;  Surgeon: Algernon Huxley, MD;  Location: [facility];  Service: Cardiovascular;  Laterality: Right;  . CHOLECYSTECTOMY N/A 04/23/2016   Procedure: LAPAROSCOPIC CHOLECYSTECTOMY;  Surgeon: [name], MD;  Location:  [facility];  Service: General;  Laterality: N/A;  . COLON SURGERY  2011   Colectomy for ileo-cecal valve cancer  . CORONARY ARTERY BYPASS GRAFT  1997   x 3  . CYSTOSCOPY W/ RETROGRADES Bilateral 09/03/2017   Procedure: CYSTOSCOPY WITH RETROGRADE PYELOGRAM;  Surgeon: Hollice Espy, MD;  Location: [facility];  Service: Urology;  Laterality: Bilateral;  . CYSTOSCOPY WITH STENT PLACEMENT Right 09/03/2017   Procedure: CYSTOSCOPY WITH STENT PLACEMENT;  Surgeon: Hollice Espy, MD;  Location: [facility];  Service: Urology;  Laterality: Right;  . ENDARTERECTOMY Right 07/17/2017   Procedure: ENDARTERECTOMY CAROTID;  Surgeon: Algernon Huxley, MD;  Location: [facility];  Service: Vascular;  Laterality: Right;  . HERNIA REPAIR  2011   Ventral hernia  . HOLMIUM LASER APPLICATION N/A 82/99/3716   Procedure: HOLMIUM LASER APPLICATION;  Surgeon: Hollice Espy, MD;  Location: [facility];  Service: Urology;  Laterality: N/A;  . KNEE SURGERY Left   . KYPHOPLASTY N/A 08/14/2017   Procedure: [id number];  Surgeon: Hessie Knows, MD;  Location: [facility];  Service: Orthopedics;  Laterality: N/A;  . PROSTATE SURGERY  2002   BPH benign pathology  . SPINE SURGERY  1989   Lumbar disc    Home Medications:  Allergies as of 11/21/2017      Reactions   Ace Inhibitors Other (See Comments)   Reaction:  Raises potassium    Quinapril Rash, Other (See Comments)   hyperkalemia      Medication List        Accurate as of 11/21/17 11:43 AM. Always use your most recent med list.          acetaminophen 500 MG tablet Commonly known as:  TYLENOL Take 1,000 mg every 6 (six) hours as needed by mouth for moderate pain or headache.   aspirin 81 MG EC tablet Take 1 tablet (81 mg total) by mouth daily.   atenolol 50 MG tablet Commonly known as:  TENORMIN Take 1 tablet (50 mg total) by mouth daily.   atorvastatin 40 MG tablet Commonly known as:  LIPITOR Take 1 tablet (40 mg total) daily at 6 PM by mouth.   docusate  sodium 100 MG capsule Commonly known as:  COLACE Take 1 capsule (100 mg total) by mouth 2 (two) times daily.   insulin NPH Human 100 UNIT/ML injection Commonly known as:  HUMULIN N,NOVOLIN N Inject 0.25 mLs (25 Units total) into the skin 2 (two) times daily.   insulin regular 100 units/mL injection Commonly known as:  NOVOLIN R,HUMULIN R Inject 10-15 Units See admin instructions into the skin. Inject 15 units SQ in the morning and inject 10 units SQ with supper   levothyroxine 300 MCG tablet Commonly known as:  SYNTHROID, LEVOTHROID Take 300 mcg by mouth daily before breakfast.   sodium bicarbonate 650 MG tablet Take 650 mg 2 (two) times daily by mouth.   tamsulosin 0.4 MG Caps capsule Commonly known as:  FLOMAX Take 1 capsule (0.4 mg total) by mouth daily.  Allergies:  Allergies  Allergen Reactions  . Ace Inhibitors Other (See Comments)    Reaction:  Raises potassium   . Quinapril Rash and Other (See Comments)    hyperkalemia    Family History: Family History  Problem Relation Age of Onset  . Cancer Mother        Lung  . Diabetes Mother   . Heart disease Mother   . Heart disease Father     Social History:  reports that he quit smoking about 21 years ago. His smoking use included cigarettes. he has never used smokeless tobacco. He reports that he does not drink alcohol or use drugs.  ROS: UROLOGY Frequent Urination?: No Hard to postpone urination?: No Burning/pain with urination?: No Get up at night to urinate?: No Leakage of urine?: No Urine stream starts and stops?: No Trouble starting stream?: No Do you have to strain to urinate?: No Blood in urine?: No Urinary tract infection?: No Sexually transmitted disease?: No Injury to kidneys or bladder?: No Painful intercourse?: No Weak stream?: No Erection problems?: No Penile pain?: No  Gastrointestinal Nausea?: No Vomiting?: No Indigestion/heartburn?: No Diarrhea?: No Constipation?:  No  Constitutional Fever: No Night sweats?: No Weight loss?: No Fatigue?: No  Skin Skin rash/lesions?: No Itching?: No  Eyes Blurred vision?: No Double vision?: No  Ears/Nose/Throat Sore throat?: No Sinus problems?: No  Hematologic/Lymphatic Swollen glands?: No Easy bruising?: No  Cardiovascular Leg swelling?: No Chest pain?: No  Respiratory Cough?: No Shortness of breath?: No  Endocrine Excessive thirst?: No  Musculoskeletal Back pain?: No Joint pain?: No  Neurological Headaches?: No Dizziness?: No  Psychologic Depression?: No Anxiety?: No  Physical Exam: BP 118/63 (BP Location: Right Arm, Patient Position: Sitting, Cuff Size: Normal)   Pulse 63   Ht 6\' 2"  (1.88 m)   Wt 215 lb 11.2 oz (97.8 kg)   BMI 27.69 kg/m   Constitutional:  Alert and oriented, No acute distress. HEENT: [place] AT, moist mucus membranes.  Trachea midline, no masses. Cardiovascular: No clubbing, cyanosis, or edema. Respiratory: Normal respiratory effort, no increased work of breathing. GI: Abdomen is soft, nontender, nondistended, no abdominal masses GU: No CVA tenderness.  Skin: No rashes, bruises or suspicious lesions. Lymph: No cervical or inguinal adenopathy. Neurologic: Grossly intact, no focal deficits, moving all 4 extremities. Psychiatric: Normal mood and affect.  Laboratory Data: Lab Results  Component Value Date   WBC 8.3 11/14/2017   HGB 11.4 (L) 11/14/2017   HCT 34.9 (L) 11/14/2017   MCV 93.6 11/14/2017   PLT 124 (L) 11/14/2017    Lab Results  Component Value Date   CREATININE 2.96 (H) 11/15/2017    Lab Results  Component Value Date   PSA <0.1 05/16/2014    No results found for: TESTOSTERONE  Lab Results  Component Value Date   HGBA1C 7.1 (H) 08/11/2017    Urinalysis    Component Value Date/Time   COLORURINE RED (A) 11/13/2017 2022   APPEARANCEUR TURBID (A) 11/13/2017 2022   LABSPEC 1.015 11/13/2017 2022   PHURINE  11/13/2017 2022    TEST  NOT REPORTED DUE TO COLOR INTERFERENCE OF URINE PIGMENT   GLUCOSEU (A) 11/13/2017 2022    TEST NOT REPORTED DUE TO COLOR INTERFERENCE OF URINE PIGMENT   HGBUR (A) 11/13/2017 2022    TEST NOT REPORTED DUE TO COLOR INTERFERENCE OF URINE PIGMENT   BILIRUBINUR (A) 11/13/2017 2022    TEST NOT REPORTED DUE TO COLOR INTERFERENCE OF URINE PIGMENT   KETONESUR (A) 11/13/2017  2022    TEST NOT REPORTED DUE TO COLOR INTERFERENCE OF URINE PIGMENT   PROTEINUR (A) 11/13/2017 2022    TEST NOT REPORTED DUE TO COLOR INTERFERENCE OF URINE PIGMENT   NITRITE (A) 11/13/2017 2022    TEST NOT REPORTED DUE TO COLOR INTERFERENCE OF URINE PIGMENT   LEUKOCYTESUR (A) 11/13/2017 2022    TEST NOT REPORTED DUE TO COLOR INTERFERENCE OF URINE PIGMENT    Assessment & Plan:    1.  Urinary retention  2.  Right hydroureteronephrosis. I discussed with the patient that he continues to have persistent right hydroureteronephrosis despite complete bladder drainage.  At this time I recommend that he undergoes a Lasix renogram previously nonobstructive process.  I recommend he keeps his Foley until that time to ensure maximal drainage.  We will have him follow-up after undergoing the renogram.  I do think at that time assuming he has nonobstructive drainage that we can remove his Foley for a trial of void since his PVR is via CIC were still low.  He will follow-up after undergoing imaging studies.  Return for early AM appt for possible TOV after renogram.  Nickie Retort, [place] [address], [place], [place] [address] [phone]

## 2017-11-25 ENCOUNTER — Other Ambulatory Visit: Payer: Self-pay | Admitting: *Deleted

## 2017-11-25 ENCOUNTER — Encounter: Payer: Self-pay | Admitting: *Deleted

## 2017-11-25 NOTE — Patient Outreach (Signed)
[facility]) Care Management   11/25/2017  NAHUN Ruiz June 08, 1941 [id number]  [name] is an 77 y.o. male  Subjective:  Patient discussed recent hospitalization and current plan to keep foley catheter until next urology visit and test.   Objective:  BP 120/70 (BP Location: Left Arm, Patient Position: Sitting, Cuff Size: Large)   Pulse 67   Resp 18   SpO2 97%  Review of Systems  Constitutional: Negative.   HENT: Negative.        Voice hoarseness   Respiratory: Negative.   Cardiovascular: Negative.   Gastrointestinal: Negative.   Genitourinary:       Has foley catheter, wearing leg bag at visit  Musculoskeletal: Negative.   Skin: Negative.   Neurological: Negative.   Endo/Heme/Allergies: Negative.   Psychiatric/Behavioral: Negative.     Physical Exam  Constitutional: He is oriented to person, place, and time. He appears well-developed and well-nourished.  Cardiovascular: Normal rate, normal heart sounds and intact distal pulses.  Respiratory: Effort normal.  GI: Soft. Bowel sounds are normal.  Neurological: He is alert and oriented to person, place, and time.  Skin: Skin is warm and dry.  Psychiatric: He has a normal mood and affect. His behavior is normal. Judgment and thought content normal.    Encounter Medications:   Outpatient Encounter Medications as of 11/25/2017  Medication Sig Note  . acetaminophen (TYLENOL) 500 MG tablet Take 1,000 mg every 6 (six) hours as needed by mouth for moderate pain or headache.   Marland Kitchen aspirin EC 81 MG EC tablet Take 1 tablet (81 mg total) by mouth daily.   Marland Kitchen atenolol (TENORMIN) 50 MG tablet Take 1 tablet (50 mg total) by mouth daily.   Marland Kitchen atorvastatin (LIPITOR) 40 MG tablet Take 1 tablet (40 mg total) daily at 6 PM by mouth.   . docusate sodium (COLACE) 100 MG capsule Take 1 capsule (100 mg total) by mouth 2 (two) times daily. (Patient not taking: Reported on 10/31/2017)   . insulin NPH Human (HUMULIN N,NOVOLIN N)  100 UNIT/ML injection Inject 0.25 mLs (25 Units total) into the skin 2 (two) times daily.   . insulin regular (NOVOLIN R,HUMULIN R) 100 units/mL injection Inject 10-15 Units See admin instructions into the skin. Inject 15 units SQ in the morning and inject 10 units SQ with supper 10/31/2017: Reports taking 12 units   . levothyroxine (SYNTHROID, LEVOTHROID) 300 MCG tablet Take 300 mcg by mouth daily before breakfast.   . sodium bicarbonate 650 MG tablet Take 650 mg 2 (two) times daily by mouth.    . tamsulosin (FLOMAX) 0.4 MG CAPS capsule Take 1 capsule (0.4 mg total) by mouth daily.    No facility-administered encounter medications on file as of 11/25/2017.     Functional Status:   In your present state of health, do you have any difficulty performing the following activities: 11/17/2017 11/14/2017  Hearing? N N  Vision? N N  Difficulty concentrating or making decisions? N N  Walking or climbing stairs? Y N  Comment takes few steps slowly -  Dressing or bathing? N N  Comment - -  Doing errands, shopping? N [place] and eating ? Y -  Comment wife helps  -  Using the Toilet? N -  In the past six months, have you accidently leaked urine? N -  Comment has foley -  Do you have problems with loss of bowel control? N -  Managing your  Medications? Y -  Comment wife assist  -  Managing your Finances? N -  Housekeeping or managing your Housekeeping? Y -  Comment wife available to assist  -  Some recent data might be hidden    Fall/Depression Screening:    Fall Risk  11/17/2017 08/20/2017 07/31/2017  Falls in the past year? Yes Yes Yes  Number falls in past yr: 1 1 1   Injury with Fall? Yes Yes Yes  Risk Factor Category  - High Fall Risk High Fall Risk  Risk for fall due to : History of fall(s) History of fall(s);Impaired balance/gait History of fall(s);Impaired balance/gait;Impaired mobility  Follow up - Falls prevention discussed Falls evaluation completed;Falls prevention  discussed;Education provided   [facility] 2/9 Scores 11/17/2017 09/10/2017 07/31/2017 06/20/2017 07/22/2016 03/19/2016 03/16/2015  PHQ - 2 Score 0 0 0 0 0 0 0    Assessment:  Transition of care home visit.  Urinary retention  Patient continues with foley catheter in place until further test, and follow up with Dr.Budzyn, urologist, awaiting call from [facility] regarding renogram.  Urine clear yellow, patient wearing leg bag, report she has only one bag, prefers to wear this than foley catheter bag.    Diabetes Patient reports taking insulin daily, but not checking blood sugar on a regular basis, has only checked it 2 times in the last 30 days. Patient reports having 1  episode in the last 2 weeks, of hypoglycemia symptoms, took glucose tablets 2 and reports that did the job. Will benefit from continued education on treatment of low blood sugar. Patient events usually occur before  lunch time.  Patient has all the  needed supplies for monitoring blood sugar.  Has upcoming visit with Dr.Paul, endocrinologist and will discuss.  Discussed importance of annual eye exam, patient reports he wants to get some of the other follow up done before he makes an appointments.  Hypertension Patient monitoring blood pressure at least weekly, recent home reading  137/69.  Advanced directives Patient requesting assistance with getting Advanced directive completed.  History of CVA,Right CEA Tolerating activity, driving to local appointments and shopping. Patient still has hoarse voice and upcoming follow up with ENT. Plavix is on hold until after follow up urinary testing.   Discussed PCP follow up visit after discharge, offered to call office.  wife reports she will call to schedule.   Plan:  Will continue to follow for weekly transition of transition of care, next call in a week. Placed call to [facility] /Palliative care regarding community services of completing /notarizing Advanced Directive able to leave a message with my  return call back number.   Placed call to Dr.Budzyn office able to speak with triage nurse regarding foley catheter leg bag patient wearing 24 hours a day, not using foley bag. Per nurse it is okay for patient to continue to wear leg bag and they will change at next office visit.  Will send   [facility] CM Care Plan Problem One     Most Recent Value  Care Plan Problem One  Recent hospital admission related to hematuria   Role Documenting the Problem One  Care Management [place] for Problem One  Active  [facility] Long Term Goal   Patient will not experience a hospital admission in the next 31 days   [facility] Long Term Goal Start Date  11/17/17  Interventions for Problem One Long Term Goal  Discussed clinical state, reviewed medication and advised regarding taking as prescribed. Reviewed to notify MD  of new changes in urine color or discomfort.   [facility] CM Short Term Goal #1   Patient will report attending all medical appointment in the next 21 days   [facility] CM Short Term Goal #1 Start Date  11/17/17  Interventions for Short Term Goal #1  Reviewed with patient and wife upcoming medical visits, called to verify endocrinologist visit.   [facility] CM Short Term Goal #2   Patient will be able to report increase knowledge of maintaing foley catheter in the next 30 days   [facility] CM Short Term Goal #2 Start Date  11/25/17  Interventions for Short Term Goal #2  Discussed with patient current foley care, advised regarding infection prevention, clean foley site with soap and water , rinse well, empty bag frequently throughtout the day, RNCM call to urology office to clarify use of leg bag vs foley bag. and explained to patient .   [facility] CM Short Term Goal #3  Patient will be able to recall steps of treating low blood sugar in the next 30 days   [facility] CM Short Term Goal #3 Start Date  11/17/17  Interventions for Short Tern Goal #3  Discussed with patient recent low blood episode and treatment , reviewed recommended low blood sugar  snack choices as well as carbohydrate amount reviewed bottle label for glucose tablets , reviewed rule of 15.       [name], RN, [place] Management Coordinator  [phone]- Mobile [phone]- Toll Free Main Office

## 2017-11-27 ENCOUNTER — Ambulatory Visit: Payer: PPO

## 2017-12-01 ENCOUNTER — Other Ambulatory Visit: Payer: Self-pay | Admitting: *Deleted

## 2017-12-01 DIAGNOSIS — N184 Chronic kidney disease, stage 4 (severe): Secondary | ICD-10-CM | POA: Diagnosis not present

## 2017-12-01 NOTE — Patient Outreach (Signed)
[facility]) Care Management  12/01/2017  [name] 28-Jan-1941 [id number]   Transition of care call  Unsuccessful telephone outreach call to patient for transition of care , able to leave a HIPAA complaint message requesting a return call .   Plan Will plan follow up call later in week.    [name], RN, [place] Management Coordinator  [phone]- Mobile [phone]- Toll Free Main Office

## 2017-12-02 DIAGNOSIS — Z794 Long term (current) use of insulin: Secondary | ICD-10-CM | POA: Diagnosis not present

## 2017-12-02 DIAGNOSIS — E063 Autoimmune thyroiditis: Secondary | ICD-10-CM | POA: Diagnosis not present

## 2017-12-02 DIAGNOSIS — E1165 Type 2 diabetes mellitus with hyperglycemia: Secondary | ICD-10-CM | POA: Diagnosis not present

## 2017-12-02 DIAGNOSIS — E038 Other specified hypothyroidism: Secondary | ICD-10-CM | POA: Diagnosis not present

## 2017-12-04 ENCOUNTER — Other Ambulatory Visit: Payer: Self-pay | Admitting: *Deleted

## 2017-12-04 DIAGNOSIS — R490 Dysphonia: Secondary | ICD-10-CM | POA: Diagnosis not present

## 2017-12-04 DIAGNOSIS — J3801 Paralysis of vocal cords and larynx, unilateral: Secondary | ICD-10-CM | POA: Diagnosis not present

## 2017-12-04 NOTE — Patient Outreach (Signed)
[facility]) Care Management  12/04/2017  [name] August 06, 1941 [id number]   Telephone follow up call  2nd attempt for transition of care call this week, unsuccessful , able to leave a HIPAA compliant message requesting a return call.   Care Coordination call.  Noted patient follow up appointment with Dr.Budyzn on 3/1, after renogram. At home visit on last week, patient did not have schedule for renogram test, and not noted as scheduled on Epic schedule today.  Placed call to [facility] scheduling for NM spoke with Manuela Schwartz she confirms she has  order and has authorization and test has not been scheduled. She will contact patient to schedule test.     Plan Will plan follow up call to patient in the next week.    [name], RN, [place] Management Coordinator  [phone]- Mobile [phone]- Toll Free Main Office

## 2017-12-08 ENCOUNTER — Other Ambulatory Visit: Payer: Self-pay | Admitting: *Deleted

## 2017-12-08 NOTE — Patient Outreach (Signed)
[facility]) Care Management  12/08/2017  ARY LAVINE [birth year] [id number]   Transition of care call  Unsuccessful outreach call to patient, able to leave a HIPAA compliant message for return call   Plan  Will await return call if no response will plan return call within a week.   [name], RN, [place] Management Coordinator  [phone]- Mobile [phone]- Toll Free Main Office

## 2017-12-09 ENCOUNTER — Inpatient Hospital Stay: Admission: RE | Admit: 2017-12-09 | Payer: PPO | Source: Ambulatory Visit

## 2017-12-09 DIAGNOSIS — E063 Autoimmune thyroiditis: Secondary | ICD-10-CM | POA: Diagnosis not present

## 2017-12-09 DIAGNOSIS — E1165 Type 2 diabetes mellitus with hyperglycemia: Secondary | ICD-10-CM | POA: Diagnosis not present

## 2017-12-09 DIAGNOSIS — Z794 Long term (current) use of insulin: Secondary | ICD-10-CM | POA: Diagnosis not present

## 2017-12-09 DIAGNOSIS — N184 Chronic kidney disease, stage 4 (severe): Secondary | ICD-10-CM | POA: Diagnosis not present

## 2017-12-09 DIAGNOSIS — E1142 Type 2 diabetes mellitus with diabetic polyneuropathy: Secondary | ICD-10-CM | POA: Diagnosis not present

## 2017-12-09 DIAGNOSIS — E1122 Type 2 diabetes mellitus with diabetic chronic kidney disease: Secondary | ICD-10-CM | POA: Diagnosis not present

## 2017-12-09 DIAGNOSIS — E038 Other specified hypothyroidism: Secondary | ICD-10-CM | POA: Diagnosis not present

## 2017-12-11 ENCOUNTER — Other Ambulatory Visit: Payer: Self-pay

## 2017-12-11 ENCOUNTER — Encounter
Admission: RE | Admit: 2017-12-11 | Discharge: 2017-12-11 | Disposition: A | Discharge status: Home / Self Care | Payer: PPO | Source: Ambulatory Visit | Attending: Unknown Physician Specialty | Admitting: Unknown Physician Specialty

## 2017-12-11 DIAGNOSIS — Z01812 Encounter for preprocedural laboratory examination: Secondary | ICD-10-CM | POA: Insufficient documentation

## 2017-12-11 HISTORY — DX: Hypothyroidism, unspecified: E03.9

## 2017-12-11 HISTORY — DX: Other specified disorders of kidney and ureter: N28.89

## 2017-12-11 HISTORY — DX: Chronic kidney disease, unspecified: N18.9

## 2017-12-11 HISTORY — DX: Abnormal weight loss: R63.4

## 2017-12-11 LAB — CBC WITH DIFFERENTIAL/PLATELET
BASOS PCT: 1 %
Basophils Absolute: 0.1 10*3/uL (ref 0–0.1)
EOS ABS: 0.2 10*3/uL (ref 0–0.7)
EOS PCT: 3 %
HCT: 35.8 % — ABNORMAL LOW (ref 40.0–52.0)
Hemoglobin: 11.6 g/dL — ABNORMAL LOW (ref 13.0–18.0)
Lymphocytes Relative: 35 %
Lymphs Abs: 2.4 10*3/uL (ref 1.0–3.6)
MCH: 30.7 pg (ref 26.0–34.0)
MCHC: 32.5 g/dL (ref 32.0–36.0)
MCV: 94.5 fL (ref 80.0–100.0)
Monocytes Absolute: 0.6 10*3/uL (ref 0.2–1.0)
Monocytes Relative: 9 %
NEUTROS PCT: 52 %
Neutro Abs: 3.5 10*3/uL (ref 1.4–6.5)
PLATELETS: 112 10*3/uL — AB (ref 150–440)
RBC: 3.79 MIL/uL — AB (ref 4.40–5.90)
RDW: 15.8 % — ABNORMAL HIGH (ref 11.5–14.5)
WBC: 6.8 10*3/uL (ref 3.8–10.6)

## 2017-12-11 LAB — BASIC METABOLIC PANEL
Anion gap: 7 (ref 5–15)
BUN: 37 mg/dL — ABNORMAL HIGH (ref 6–20)
CO2: 19 mmol/L — ABNORMAL LOW (ref 22–32)
CREATININE: 2.97 mg/dL — AB (ref 0.61–1.24)
Calcium: 8.1 mg/dL — ABNORMAL LOW (ref 8.9–10.3)
Chloride: 112 mmol/L — ABNORMAL HIGH (ref 101–111)
GFR, EST AFRICAN AMERICAN: 22 mL/min — AB (ref >60)
GFR, EST NON AFRICAN AMERICAN: 19 mL/min — AB (ref >60)
Glucose, Bld: 230 mg/dL — ABNORMAL HIGH (ref 65–99)
POTASSIUM: 6.2 mmol/L — AB (ref 3.5–5.1)
SODIUM: 138 mmol/L (ref 135–145)

## 2017-12-11 NOTE — Pre-Procedure Instructions (Addendum)
Patient has foley catheter in place and will have until later in March when he returns to see the Urologist.  Potassium result today = 6.2. Notice sent to Dr. Army Melia. Patient currently takes Sodium Bicarb to counter elevated levels but this has been the highest result so far.  Discussed abnormal labs and patient medical history with Dr. Kayleen Memos.  He requests for PCP to optimize and treat his elevated potassium level before surgery.  Also, with renal labs as elevated as they are, does patient possibly need dialysis prior to surgery??  A request was sent to Dr. Ubaldo Glassing requesting office notes, EKG and stress test.  A fax was sent to Dr. Tami Ribas updating him on current labs and the need for above clearance.

## 2017-12-11 NOTE — Patient Instructions (Incomplete)
Your procedure is scheduled on: TUESDAY, MARCH 5  Report to [facility]  To find out your arrival time please call [phone] between 1PM - 3PM on Monday, MARCH 4  Remember: Instructions that are not followed completely may result in serious medical risk, up to and including death, or upon the discretion of your surgeon and  anesthesiologist your surgery may need to be rescheduled.     _X__ 1. Do not eat food after midnight the night before your procedure.                 No gum chewing or hard candies.                   You may drink clear liquids up to 2 hours                 before you are scheduled to arrive for your surgery-                  DO not drink clear liquids within 2 hours of the start of your surgery.                  Clear Liquids include:  water, apple juice without pulp, clear carbohydrate                 drink such as Clearfast of Gartorade, Black Coffee or Tea (Do not add                 anything to coffee or tea).  __X__2.  On the morning of surgery brush your teeth with toothpaste and water,                       you may rinse your mouth with mouthwash if you wish.                               Do not swallow any toothpaste of mouthwash.     _X__ 3.  No Alcohol for 24 hours before or after surgery.   _X__ 4.  Do Not Smoke or use e-cigarettes For 24 Hours Prior to Your Surgery.                 Do not use any chewable tobacco products for at least 6 hours prior to                 surgery.  ____  5.  Bring all medications with you on the day of surgery if instructed.   ____  6.  Notify your doctor if there is any change in your medical condition      (cold, fever, infections).     Do not wear jewelry, make-up, hairpins, clips or nail polish. Do not wear lotions, powders, or perfumes. You may wear deodorant. Do not shave 48 hours prior to surgery. Men may shave face and neck. Do not bring valuables to the  hospital.    [facility] is not responsible for any belongings or valuables.  Contacts, dentures or bridgework may not be worn into surgery. Leave your suitcase in the car. After surgery it may be brought to your room. For patients admitted to the hospital, discharge time is determined by your treatment team.   Patients discharged the day of surgery will not be allowed to drive home.   Please read  over the following fact sheets that you were given:   PREPARING FOR SURGERY    ____ Take these medicines the morning of surgery with A SIP OF WATER:    1.  ATENOLOL  2.  LEVOTHYROXINE  3.  FLOMAX  4.    5.  6.  ____ Fleet Enema (as directed)   __X__ Use CHG Soap as directed  ____ Use inhalers on the day of surgery   ____ Take 1/2 of usual insulin dose the night before surgery.TAKE 15 UNITS OF HUMULIN N                AND 5 UNITS OF HUMULIN R           No insulin the morning of surgery.   ____ Stop Coumadin/Plavix AS YOU HAVE ALREADY             STOP ASPIRIN NOW!!  ____ Stop Anti-inflammatories NOW   ____ Stop supplements until after surgery.               YOU MAY CONTINUE YOUR SODIUM BICARB BUT DO NOT TAKE ON SURGERY DAY  ____ Bring C-Pap to the hospital.   CONTINUE TO TAKE YOUR ATORVASTATIN AS SCHEDULED.  PREPARE SOME SOFT FOODS FOR DISCHARGE                                                    Your procedure is scheduled on: @ADMITDT2 @ Report to [facility] Locations:[id number]}. To find out your arrival time please call [phone] between 1PM - 3PM on ***.  Remember: Instructions that are not followed completely may result in serious medical risk, up to and including death, or upon the discretion of your surgeon and anesthesiologist your surgery may need to be rescheduled.     _X__ 1. Do not eat food after midnight the night before your procedure.                 No gum chewing or hard candies. You may drink clear liquids up to 2 hours                  before you are scheduled to arrive for your surgery- DO not drink clear                 liquids within 2 hours of the start of your surgery.                 Clear Liquids include:  water, apple juice without pulp, clear carbohydrate                 drink such as Clearfast of Gartorade, Black Coffee or Tea (Do not add                 anything to coffee or tea).  __X__2.  On the morning of surgery brush your teeth with toothpaste and water, you                 may rinse your mouth with mouthwash if you wish.  Do not swallow any              toothpaste of mouthwash.     _X__ 3.  No Alcohol for 24 hours before or after surgery.   _X__ 4.  Do Not Smoke or use e-cigarettes For  24 Hours Prior to Your Surgery.                 Do not use any chewable tobacco products for at least 6 hours prior to                 surgery.  ____  5.  Bring all medications with you on the day of surgery if instructed.   ____  6.  Notify your doctor if there is any change in your medical condition      (cold, fever, infections).     Do not wear jewelry, make-up, hairpins, clips or nail polish. Do not wear lotions, powders, or perfumes. You may wear deodorant. Do not shave 48 hours prior to surgery. Men may shave face and neck. Do not bring valuables to the hospital.    [facility] is not responsible for any belongings or valuables.  Contacts, dentures or bridgework may not be worn into surgery. Leave your suitcase in the car. After surgery it may be brought to your room. For patients admitted to the hospital, discharge time is determined by your treatment team.   Patients discharged the day of surgery will not be allowed to drive home.   Please read over the following fact sheets that you were given:   {OR PAT FACT SHEETS GIVEN:20460}          ____ Take these medicines the morning of surgery with A SIP OF WATER:    1. ***  2.   3.   4.  5.  6.  ____ Fleet Enema (as directed)   ____  Use CHG Soap as directed  ____ Use inhalers on the day of surgery  ____ Stop metformin 2 days prior to surgery    ____ Take 1/2 of usual insulin dose the night before surgery. No insulin the morning          of surgery.   ____ Stop Coumadin/Plavix/aspirin on ***  ____ Stop Anti-inflammatories on ***   ____ Stop supplements until after surgery.    ____ Bring C-Pap to the hospital.

## 2017-12-12 ENCOUNTER — Ambulatory Visit: Payer: PPO

## 2017-12-12 ENCOUNTER — Other Ambulatory Visit: Payer: Self-pay | Admitting: Internal Medicine

## 2017-12-12 ENCOUNTER — Other Ambulatory Visit: Payer: Self-pay | Admitting: *Deleted

## 2017-12-12 DIAGNOSIS — E875 Hyperkalemia: Secondary | ICD-10-CM

## 2017-12-12 NOTE — Patient Outreach (Signed)
[facility]) Care Management  12/12/2017  SANAV REMER 1941/04/12 [id number]   Unsuccessful attempt x2 this week for transition of care call, able to leave a HIPPA compliant message for return call    Plan RNCM will plan transition of care call in the next week.    [name], RN, [place] Management Coordinator  [phone]- Mobile [phone]- Toll Free Main Office

## 2017-12-13 ENCOUNTER — Emergency Department
Admission: EM | Admit: 2017-12-13 | Discharge: 2017-12-13 | Disposition: A | Discharge status: Home / Self Care | Payer: PPO | Attending: Emergency Medicine | Admitting: Emergency Medicine

## 2017-12-13 ENCOUNTER — Encounter: Payer: Self-pay | Admitting: Emergency Medicine

## 2017-12-13 ENCOUNTER — Other Ambulatory Visit: Payer: Self-pay

## 2017-12-13 DIAGNOSIS — Y828 Other medical devices associated with adverse incidents: Secondary | ICD-10-CM | POA: Diagnosis not present

## 2017-12-13 DIAGNOSIS — Z7901 Long term (current) use of anticoagulants: Secondary | ICD-10-CM | POA: Insufficient documentation

## 2017-12-13 DIAGNOSIS — T83018A Breakdown (mechanical) of other indwelling urethral catheter, initial encounter: Secondary | ICD-10-CM | POA: Diagnosis not present

## 2017-12-13 DIAGNOSIS — I252 Old myocardial infarction: Secondary | ICD-10-CM | POA: Insufficient documentation

## 2017-12-13 DIAGNOSIS — N184 Chronic kidney disease, stage 4 (severe): Secondary | ICD-10-CM | POA: Diagnosis not present

## 2017-12-13 DIAGNOSIS — E1140 Type 2 diabetes mellitus with diabetic neuropathy, unspecified: Secondary | ICD-10-CM | POA: Diagnosis not present

## 2017-12-13 DIAGNOSIS — Z794 Long term (current) use of insulin: Secondary | ICD-10-CM | POA: Diagnosis not present

## 2017-12-13 DIAGNOSIS — E1151 Type 2 diabetes mellitus with diabetic peripheral angiopathy without gangrene: Secondary | ICD-10-CM | POA: Diagnosis not present

## 2017-12-13 DIAGNOSIS — T83011A Breakdown (mechanical) of indwelling urethral catheter, initial encounter: Secondary | ICD-10-CM | POA: Diagnosis not present

## 2017-12-13 DIAGNOSIS — E1122 Type 2 diabetes mellitus with diabetic chronic kidney disease: Secondary | ICD-10-CM | POA: Diagnosis not present

## 2017-12-13 DIAGNOSIS — I2510 Atherosclerotic heart disease of native coronary artery without angina pectoris: Secondary | ICD-10-CM | POA: Diagnosis not present

## 2017-12-13 DIAGNOSIS — I129 Hypertensive chronic kidney disease with stage 1 through stage 4 chronic kidney disease, or unspecified chronic kidney disease: Secondary | ICD-10-CM | POA: Diagnosis not present

## 2017-12-13 DIAGNOSIS — Z87891 Personal history of nicotine dependence: Secondary | ICD-10-CM | POA: Insufficient documentation

## 2017-12-13 DIAGNOSIS — Z7982 Long term (current) use of aspirin: Secondary | ICD-10-CM | POA: Insufficient documentation

## 2017-12-13 NOTE — ED Triage Notes (Signed)
Pt to ED states foley catheter bag leaking, woke up with bed wet.  States went to get leg bag from drug store but not sold there so came here for just get changed out.  Denies tubing problem, pain, or other symptoms.

## 2017-12-13 NOTE — ED Notes (Signed)
Pt states awoke in the middle of the night and noted that his bed was wet. Pt has chronic foley. Pt states hole popped in his leg bag, requesting a new leg bag. Denies any other complaints at this time. Pt states attempted to get a leg bag from drug store but was unable to get one and was directed to come here.

## 2017-12-13 NOTE — ED Notes (Signed)
NAD noted at time of D/C. Pt denies questions or concerns. Pt ambulatory to the lobby at this time.  

## 2017-12-13 NOTE — ED Provider Notes (Signed)
[facility] Emergency Department Provider Note  ____________________________________________  Time seen: Approximately 4:03 PM  I have reviewed the triage vital signs and the nursing notes.   HISTORY  Chief Complaint Foley Catheter Leaking   HPI EDRIAN Ruiz is a 77 y.o. male who presents to the emergency department for replacement of his Foley catheter bag.  Patient states that sometime overnight it burst in the bed.  He denies any complaint, but states that he caught every pharmacy he could find in the phone book and know what he had a replacement back for his Foley.  States that he called the on-call provider at his primary care office and they advised him that he would have to come to the emergency department for replacement bag.  Past Medical History:  Diagnosis Date  . Blood transfusion without reported diagnosis    patient unaware of receiving blood unless it was during surgery and he was not told  . Cancer [facility]) 2001   Colon resection  . Chronic kidney disease 11/2017   stage IV CKD per nephrologist.  . Complication of anesthesia    raspy voice since carotid endarterectomy 07/17/17. paralysis of vocal chords  . Coronary artery disease   . Diabetes mellitus without complication ([place])   . Dysrhythmia    patient unaware of any irregular heart rhythms  . Hyperlipidemia   . Hypertension   . Hypothyroidism   . Kidney mass 2019  . Myocardial infarction ([place]) 1997  . Peripheral vascular disease ([place])   . Pneumonia 2014, 2018   developed after surgery 2018  . Stroke ([place]) 07/2017   mild stroke and then had carotid endarterectomy  . Thyroid disease   . Weight loss 2019   patient has lost over 60 pounds since 07/2017.     Patient Active Problem List   Diagnosis Date Noted  . Hematuria 11/14/2017  . Urinary retention 08/28/2017  . CAP (community acquired pneumonia) 08/11/2017  . Bilateral carotid artery stenosis 08/01/2017  . Hoarseness of voice  08/01/2017  . Compression fracture of body of thoracic vertebra ([place]) 07/28/2017  . Carotid stenosis, symptomatic, with infarction ([place]) 07/17/2017  . Cerebrovascular accident (CVA) ([place]) 06/11/2017  . Chronic shoulder bursitis, left 07/22/2016  . Cholecystitis, acute 04/21/2016  . Uncontrolled type 2 diabetes mellitus with stage 4 chronic kidney disease, with long-term current use of insulin ([place]) 09/14/2015  . 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 CKD stage 4 due to type 2 diabetes mellitus ([place]) 03/16/2015  . Diabetes mellitus with polyneuropathy ([place]) 03/16/2015  . CAD in native artery 01/20/2015  . Gout 01/20/2015  . Diabetic peripheral neuropathy ([place]) 01/20/2015  . H/O malignant neoplasm of colon 01/20/2015  . Adult hypothyroidism 01/20/2015  . Hyperlipidemia associated with type 2 diabetes mellitus ([place]) 01/20/2015  . Encounter for long-term (current) use of insulin ([place]) 07/27/2014  . High potassium 05/19/2014  . 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 Hypertension in stage 4 chronic kidney disease due to type 2 diabetes mellitus ([place]) 07/15/2013    Past Surgical History:  Procedure Laterality Date  . APPENDECTOMY  2011  . BACK SURGERY    . CARDIAC CATHETERIZATION  1997  . CAROTID ANGIOGRAPHY Right 06/13/2017   Procedure: Right subclavian and Carotid Angiography, possible intervention;  Surgeon: Algernon Huxley, MD;  Location: [place] [facility];  Service: Cardiovascular;  Laterality: Right;  . CHOLECYSTECTOMY N/A 04/23/2016   had infection post surgery requiring him to debride daily  .  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 CORONARY ARTERY BYPASS GRAFT  1997   x 3  . CYSTOSCOPY W/ RETROGRADES Bilateral 09/03/2017   Procedure: CYSTOSCOPY WITH RETROGRADE PYELOGRAM;  Surgeon: Hollice Espy, MD;  Location: [facility] OR[facility];  Service: Urology;  Laterality: Bilateral;  . CYSTOSCOPY W/ URETERAL STENT  REMOVAL  08/2017  . CYSTOSCOPY WITH STENT PLACEMENT Right 09/03/2017   Procedure: CYSTOSCOPY WITH STENT PLACEMENT;  Surgeon: Hollice Espy, MD;  Location: [facility] OR[facility];  Service: Urology;  Laterality: Right;  . ENDARTERECTOMY Right 07/17/2017   Procedure: ENDARTERECTOMY CAROTID;  Surgeon: Algernon Huxley, MD;  Location: [facility] ORS;  Service: Vascular;  Laterality: Right;  . HERNIA REPAIR  2011   Ventral hernia  . HOLMIUM LASER APPLICATION N/A 14/78/2956   Procedure: HOLMIUM LASER APPLICATION;  Surgeon: Hollice Espy, MD;  Location: [facility] OR[facility];  Service: Urology;  Laterality: N/A;  . KNEE SURGERY Left    arthroscopy  . KYPHOPLASTY N/A 08/14/2017   Procedure: [id number];  Surgeon: Hessie Knows, MD;  Location: [facility] ORS;  Service: Orthopedics;  Laterality: N/A;  . PROSTATE SURGERY  2002   BPH benign pathology  . SPINE SURGERY  1989   Lumbar disc    Prior to Admission medications   Medication Sig Start Date End Date Taking? Authorizing Provider  acetaminophen (TYLENOL) 500 MG tablet Take 1,000 mg every 6 (six) hours as needed by mouth for moderate pain or headache.    [provider]  aspirin EC 81 MG EC tablet Take 1 tablet (81 mg total) by mouth daily. 07/19/17   Algernon Huxley, MD  atenolol (TENORMIN) 50 MG tablet Take 1 tablet (50 mg total) by mouth daily. 08/08/17   Glean Hess, MD  atorvastatin (LIPITOR) 40 MG tablet Take 1 tablet (40 mg total) daily at 6 PM by mouth. 08/28/17   Glean Hess, MD  clopidogrel (PLAVIX) 75 MG tablet Take 75 mg by mouth daily. Stop one week prior to vocal cord surgery    [provider]  docusate sodium (COLACE) 100 MG capsule Take 1 capsule (100 mg total) by mouth 2 (two) times daily. Patient not taking: Reported on 10/31/2017 08/15/17   Vaughan Basta, MD  insulin NPH Human (HUMULIN N,NOVOLIN N) 100 UNIT/ML injection Inject 0.25 mLs (25 Units total) into the skin 2 (two) times daily. Patient taking differently: Inject 30 Units  into the skin 2 (two) times daily.  08/15/17   Vaughan Basta, MD  insulin regular (NOVOLIN R,HUMULIN R) 100 units/mL injection Inject 10-15 Units into the skin 2 (two) times daily. 15 units in the morning & 10 units in the evening with supper    [provider]  levothyroxine (SYNTHROID, LEVOTHROID) 300 MCG tablet Take 300 mcg by mouth daily before breakfast.    [provider]  sodium bicarbonate 650 MG tablet Take 650 mg 2 (two) times daily by mouth.  04/28/17 04/28/18  [provider]  tamsulosin (FLOMAX) 0.4 MG CAPS capsule Take 1 capsule (0.4 mg total) by mouth daily. 10/08/17   Nickie Retort, MD    Allergies Ace inhibitors and Quinapril  Family History  Problem Relation Age of Onset  . Cancer Mother        Lung  . Diabetes Mother   . Heart disease Mother   . Heart disease Father     Social History Social History   Tobacco Use  . Smoking status: Former Smoker  Years: 1.00    Types: Cigarettes    Last attempt to quit: 06/24/1996    Years since quitting: 21.4  . Smokeless tobacco: Never Used  Substance Use Topics  . Alcohol use: No    Alcohol/week: 0.0 oz    Comment: rare  . Drug use: No    Review of Systems Constitutional: Negative for fever. ENT: Negative for sore throat. Respiratory: Negative for cough or shortness of breath Gastrointestinal: No abdominal pain.  No nausea, no vomiting.  No diarrhea.  Musculoskeletal: Negative for generalized body aches. Skin: Negative for rash/lesion/wound. Neurological: Negative for headaches, focal weakness or numbness.  ____________________________________________   PHYSICAL EXAM:  VITAL SIGNS: ED Triage Vitals  Enc Vitals Group     BP 12/13/17 1306 139/66     Pulse Rate 12/13/17 1306 69     Resp 12/13/17 1306 16     Temp 12/13/17 1306 98.2 F (36.8 C)     Temp Source 12/13/17 1306 Oral     SpO2 12/13/17 1306 96 %     Weight 12/13/17 1307 209 lb (94.8 kg)     Height 12/13/17  1307 6\' 2"  (1.88 m)     Head Circumference --      Peak Flow --      Pain Score 12/13/17 1307 0     Pain Loc --      Pain Edu? --      Excl. in [place]? --     Constitutional: Alert and oriented. Well appearing and in no acute distress. Eyes: Conjunctivae are normal. PERRL. EOMI. Head: Atraumatic. Nose: No congestion/rhinnorhea. Mouth/Throat: Mucous membranes are moist. Neck: No stridor.  Cardiovascular: Normal rate, regular rhythm. Good peripheral circulation. Respiratory: Normal respiratory effort. Musculoskeletal: Full ROM throughout.  Neurologic:  Normal speech and language. No gross focal neurologic deficits are appreciated. Speech is normal. No gait instability. Skin:  Skin is warm, dry and intact. No rash noted. Psychiatric: Mood and affect are normal. Speech and behavior are normal.  ____________________________________________   LABS (all labs ordered are listed, but only abnormal results are displayed)  Labs Reviewed - No data to display ____________________________________________  EKG  Not indicated ____________________________________________  RADIOLOGY  Not indicated ____________________________________________   PROCEDURES  Foley bag was replaced ____________________________________________   INITIAL IMPRESSION / ASSESSMENT AND PLAN / ED COURSE     Pertinent labs & imaging results that were available during my care of the patient were reviewed by me and considered in my medical decision making (see chart for details).  Patient was advised to follow-up with his primary care provider for symptoms of concern or return to the emergency department.. ____________________________________________   FINAL CLINICAL IMPRESSION(S) / ED DIAGNOSES  Final diagnoses:  Malfunction of Foley catheter, initial encounter [facility])       Victorino Dike, FNP 12/13/17 1605    Orbie Pyo, MD 12/14/17 [phone]

## 2017-12-15 ENCOUNTER — Other Ambulatory Visit: Payer: Self-pay | Admitting: *Deleted

## 2017-12-15 ENCOUNTER — Telehealth: Payer: Self-pay

## 2017-12-15 NOTE — Pre-Procedure Instructions (Signed)
SPOKE WITH JAMIE AT DR [facility]. PLAN HAD BEEN TO RECHECK LABS KT  ON 12/12/17/ PATIENT NO SHOW. NOTIFIED [place] NOT CLEAR.

## 2017-12-15 NOTE — Patient Outreach (Addendum)
[facility]) Care Management  12/15/2017  [name] 11/19/1940 [id number]   Transition of care call  Successful telephone outreach to patient , HIPAA information verified. Patient states he just got off the phone with Dr.McQueen office regarding his planned thyroplasty surgery on 3/5, stating his potassium level was elevated and Dr.Bergland would not sign off on him having surgery. Patient states the surgery has now been rescheduled until 3/19. Discussed with patient importance of follow up with PCP regarding elevated potassium level . Patient is in agreement with calling PCP office regarding scheduling for potassium recheck. Patient states his wife has appointment on 3/7 and he will try and schedule at time also. I offered to call help coordinate MD visit patient declined stating he would handle it.   Patient discussed visit to emergency room due to leaking leg bag, reports bag in place, urine clear , denies problems has extra urine leg bags.  Patient discussed recent visit with Dr.Paul for diabetes and his A1c is 7.5, no changes to medications.   Patient has completed transition of care program 30 day, will continue to follow for complex care management.   Plan Will follow up with patient in the next day regarding PCP follow up related to elevated potassium. RNCM will notify MD office of patient recent result of potassium of 6.2 on 2/28. Placed call to Dr.Bergland office able to leave a message on nurse line , [place] regarding elevated potassium level.    [name], RN, [place] Management Coordinator  [phone]- Mobile [phone]- Toll Free Main Office

## 2017-12-15 NOTE — Telephone Encounter (Signed)
ENT called for Dr Tami Ribas to have surgery clearance for patients surgery. Dr Army Melia denied surgical clearance because patient did not come for last appt to have potassium rechecked. Potassium is too out of range to have surgery at this time.

## 2017-12-16 ENCOUNTER — Other Ambulatory Visit: Payer: Self-pay | Admitting: *Deleted

## 2017-12-16 NOTE — Patient Outreach (Signed)
[facility]) Care Management  12/16/2017  [name] September 24, 1941 [id number]   Care Coordination   Late Entry for 12/15/17  5:15 Received voice mail message from [place] at [facility] office, acknowledging they are aware of patient elevated potassium level, he did not attend appointment on 3/1 for lab recheck, but she has spoken with him and he  has a scheduled visit on 3/6 for lab draw.   James Draft, RN, [place] Management Coordinator  [phone]- Mobile [phone]- Toll Free Main Office

## 2017-12-17 ENCOUNTER — Other Ambulatory Visit: Payer: Self-pay

## 2017-12-17 DIAGNOSIS — E875 Hyperkalemia: Secondary | ICD-10-CM | POA: Diagnosis not present

## 2017-12-18 LAB — POTASSIUM: Potassium: 6.2 mmol/L — ABNORMAL HIGH (ref 3.5–5.2)

## 2017-12-19 ENCOUNTER — Ambulatory Visit
Admission: RE | Admit: 2017-12-19 | Discharge: 2017-12-19 | Disposition: A | Discharge status: Home / Self Care | Payer: PPO | Source: Ambulatory Visit | Attending: Urology | Admitting: Urology

## 2017-12-19 DIAGNOSIS — N1330 Unspecified hydronephrosis: Secondary | ICD-10-CM | POA: Diagnosis not present

## 2017-12-19 MED ORDER — FUROSEMIDE 10 MG/ML IJ SOLN
47.4000 mg | Freq: Once | INTRAMUSCULAR | Status: AC
  Administered 2017-12-19: 47.4 mg via INTRAVENOUS
  Filled 2017-12-19: qty 4.7

## 2017-12-19 MED ORDER — TECHNETIUM TC 99M MERTIATIDE
5.4500 | Freq: Once | INTRAVENOUS | Status: AC | PRN
Start: 2017-12-19 — End: 2017-12-19
  Administered 2017-12-19: 5.45 via INTRAVENOUS

## 2017-12-22 ENCOUNTER — Other Ambulatory Visit: Payer: Self-pay | Admitting: *Deleted

## 2017-12-22 ENCOUNTER — Other Ambulatory Visit: Payer: Self-pay

## 2017-12-22 NOTE — Patient Outreach (Signed)
[facility]) Care Management  12/22/2017  [name] [birth year] [id number]   Telephone assessment    Telephone follow up call to patient unsuccessful, able to leave a HIPAA compliant message for return call   Plan Will plan follow up call within a week if no return call on today.    [name], RN, [place] Management Coordinator  [phone]- Mobile [phone]- Toll Free Main Office

## 2017-12-23 ENCOUNTER — Encounter: Payer: Self-pay | Admitting: Medical Oncology

## 2017-12-23 ENCOUNTER — Emergency Department
Admission: EM | Admit: 2017-12-23 | Discharge: 2017-12-23 | Disposition: A | Discharge status: Home / Self Care | Payer: PPO | Attending: Emergency Medicine | Admitting: Emergency Medicine

## 2017-12-23 DIAGNOSIS — E1122 Type 2 diabetes mellitus with diabetic chronic kidney disease: Secondary | ICD-10-CM | POA: Insufficient documentation

## 2017-12-23 DIAGNOSIS — I129 Hypertensive chronic kidney disease with stage 1 through stage 4 chronic kidney disease, or unspecified chronic kidney disease: Secondary | ICD-10-CM | POA: Insufficient documentation

## 2017-12-23 DIAGNOSIS — N184 Chronic kidney disease, stage 4 (severe): Secondary | ICD-10-CM | POA: Insufficient documentation

## 2017-12-23 DIAGNOSIS — Z794 Long term (current) use of insulin: Secondary | ICD-10-CM | POA: Insufficient documentation

## 2017-12-23 DIAGNOSIS — Z87891 Personal history of nicotine dependence: Secondary | ICD-10-CM | POA: Insufficient documentation

## 2017-12-23 DIAGNOSIS — Z85038 Personal history of other malignant neoplasm of large intestine: Secondary | ICD-10-CM | POA: Insufficient documentation

## 2017-12-23 DIAGNOSIS — I10 Essential (primary) hypertension: Secondary | ICD-10-CM | POA: Diagnosis not present

## 2017-12-23 DIAGNOSIS — Z7982 Long term (current) use of aspirin: Secondary | ICD-10-CM | POA: Insufficient documentation

## 2017-12-23 DIAGNOSIS — Z8673 Personal history of transient ischemic attack (TIA), and cerebral infarction without residual deficits: Secondary | ICD-10-CM | POA: Insufficient documentation

## 2017-12-23 DIAGNOSIS — Z7902 Long term (current) use of antithrombotics/antiplatelets: Secondary | ICD-10-CM | POA: Insufficient documentation

## 2017-12-23 DIAGNOSIS — R899 Unspecified abnormal finding in specimens from other organs, systems and tissues: Secondary | ICD-10-CM | POA: Diagnosis not present

## 2017-12-23 DIAGNOSIS — E1140 Type 2 diabetes mellitus with diabetic neuropathy, unspecified: Secondary | ICD-10-CM | POA: Diagnosis not present

## 2017-12-23 DIAGNOSIS — R799 Abnormal finding of blood chemistry, unspecified: Secondary | ICD-10-CM | POA: Diagnosis not present

## 2017-12-23 LAB — CBC
HCT: 35.4 % — ABNORMAL LOW (ref 40.0–52.0)
HEMOGLOBIN: 11.7 g/dL — AB (ref 13.0–18.0)
MCH: 31.2 pg (ref 26.0–34.0)
MCHC: 33 g/dL (ref 32.0–36.0)
MCV: 94.5 fL (ref 80.0–100.0)
Platelets: 118 10*3/uL — ABNORMAL LOW (ref 150–440)
RBC: 3.75 MIL/uL — ABNORMAL LOW (ref 4.40–5.90)
RDW: 15.6 % — AB (ref 11.5–14.5)
WBC: 7 10*3/uL (ref 3.8–10.6)

## 2017-12-23 LAB — BASIC METABOLIC PANEL
ANION GAP: 6 (ref 5–15)
BUN: 41 mg/dL — AB (ref 6–20)
CALCIUM: 7.8 mg/dL — AB (ref 8.9–10.3)
CO2: 19 mmol/L — AB (ref 22–32)
Chloride: 112 mmol/L — ABNORMAL HIGH (ref 101–111)
Creatinine, Ser: 3.08 mg/dL — ABNORMAL HIGH (ref 0.61–1.24)
GFR calc Af Amer: 21 mL/min — ABNORMAL LOW (ref >60)
GFR calc non Af Amer: 18 mL/min — ABNORMAL LOW (ref >60)
Glucose, Bld: 152 mg/dL — ABNORMAL HIGH (ref 65–99)
Potassium: 4.9 mmol/L (ref 3.5–5.1)
Sodium: 137 mmol/L (ref 135–145)

## 2017-12-23 LAB — POTASSIUM: POTASSIUM: 4.5 mmol/L (ref 3.5–5.1)

## 2017-12-23 NOTE — ED Triage Notes (Signed)
Pt had pre-op labs done on 12/17/17, today pt was called and told that his potassium is 6.2.

## 2017-12-23 NOTE — ED Provider Notes (Signed)
[facility] Emergency Department Provider Note ____________________________________________   First MD Initiated Contact with Patient 12/23/17 1755     (approximate)  I have reviewed the triage vital signs and the nursing notes.   HISTORY  Chief Complaint Abnormal Lab    HPI [name] is a 77 y.o. male with past medical history as noted below who presents with hyperkalemia, measured to 6.2 last week, unknown onset, and not associated with any specific symptoms.  The patient states that he is supposed to get to carotid surgery but it was postponed when he was found to be hyperkalemic.  He followed up with his nephrologist this week, and she attempted to identify where he could get medication for the hyperkalemia from pharmacy, but none of the pharmacies in the area had in stock, so she told him to come to the hospital.  The patient denies any weakness, cramping, numbness, lightheadedness, chest pain, palpitations, or other acute symptoms.   Past Medical History:  Diagnosis Date  . Blood transfusion without reported diagnosis    patient unaware of receiving blood unless it was during surgery and he was not told  . Cancer [facility]) 2001   Colon resection  . Chronic kidney disease 11/2017   stage IV CKD per nephrologist.  . Complication of anesthesia    raspy voice since carotid endarterectomy 07/17/17. paralysis of vocal chords  . Coronary artery disease   . Diabetes mellitus without complication ([place])   . Dysrhythmia    patient unaware of any irregular heart rhythms  . Hyperlipidemia   . Hypertension   . Hypothyroidism   . Kidney mass 2019  . Myocardial infarction ([place]) 1997  . Peripheral vascular disease ([place])   . Pneumonia 2014, 2018   developed after surgery 2018  . Stroke ([place]) 07/2017   mild stroke and then had carotid endarterectomy  . Thyroid disease   . Weight loss 2019   patient has lost over 60 pounds since 07/2017.     Patient  Active Problem List   Diagnosis Date Noted  . Hematuria 11/14/2017  . Urinary retention 08/28/2017  . CAP (community acquired pneumonia) 08/11/2017  . Bilateral carotid artery stenosis 08/01/2017  . Hoarseness of voice 08/01/2017  . Compression fracture of body of thoracic vertebra ([place]) 07/28/2017  . Carotid stenosis, symptomatic, with infarction ([place]) 07/17/2017  . Cerebrovascular accident (CVA) ([place]) 06/11/2017  . Chronic shoulder bursitis, left 07/22/2016  . Cholecystitis, acute 04/21/2016  . 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 CKD stage 4 due to type 2 diabetes mellitus ([place]) 03/16/2015  . Diabetes mellitus with polyneuropathy ([place]) 03/16/2015  . CAD in native artery 01/20/2015  . Gout 01/20/2015  . Diabetic peripheral neuropathy ([place]) 01/20/2015  . H/O malignant neoplasm of colon 01/20/2015  . Adult hypothyroidism 01/20/2015  . Hyperlipidemia associated with type 2 diabetes mellitus ([place]) 01/20/2015  . Encounter for long-term (current) use of insulin ([place]) 07/27/2014  . High potassium 05/19/2014  . 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 Hypertension in stage 4 chronic kidney disease due to type 2 diabetes mellitus ([place]) 07/15/2013    Past Surgical History:  Procedure Laterality Date  . APPENDECTOMY  2011  . BACK SURGERY    . CARDIAC CATHETERIZATION  1997  . CAROTID ANGIOGRAPHY Right 06/13/2017   Procedure: Right subclavian and Carotid Angiography, possible  intervention;  Surgeon: Algernon Huxley, MD;  Location: [facility];  Service: Cardiovascular;  Laterality: Right;  . CHOLECYSTECTOMY N/A 04/23/2016   had infection post surgery requiring him to debride daily  . 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 CORONARY ARTERY BYPASS GRAFT  1997   x 3  .  CYSTOSCOPY W/ RETROGRADES Bilateral 09/03/2017   Procedure: CYSTOSCOPY WITH RETROGRADE PYELOGRAM;  Surgeon: Hollice Espy, MD;  Location: [facility];  Service: Urology;  Laterality: Bilateral;  . CYSTOSCOPY W/ URETERAL STENT REMOVAL  08/2017  . CYSTOSCOPY WITH STENT PLACEMENT Right 09/03/2017   Procedure: CYSTOSCOPY WITH STENT PLACEMENT;  Surgeon: Hollice Espy, MD;  Location: [facility];  Service: Urology;  Laterality: Right;  . ENDARTERECTOMY Right 07/17/2017   Procedure: ENDARTERECTOMY CAROTID;  Surgeon: Algernon Huxley, MD;  Location: [facility];  Service: Vascular;  Laterality: Right;  . HERNIA REPAIR  2011   Ventral hernia  . HOLMIUM LASER APPLICATION N/A 56/86/1683   Procedure: HOLMIUM LASER APPLICATION;  Surgeon: Hollice Espy, MD;  Location: [facility];  Service: Urology;  Laterality: N/A;  . KNEE SURGERY Left    arthroscopy  . KYPHOPLASTY N/A 08/14/2017   Procedure: [id number];  Surgeon: Hessie Knows, MD;  Location: [facility];  Service: Orthopedics;  Laterality: N/A;  . PROSTATE SURGERY  2002   BPH benign pathology  . SPINE SURGERY  1989   Lumbar disc    Prior to Admission medications   Medication Sig Start Date End Date Taking? Authorizing Provider  aspirin EC 81 MG EC tablet Take 1 tablet (81 mg total) by mouth daily. 07/19/17  Yes Dew, Erskine Squibb, MD  atenolol (TENORMIN) 50 MG tablet Take 1 tablet (50 mg total) by mouth daily. 08/08/17  Yes Glean Hess, MD  atorvastatin (LIPITOR) 40 MG tablet Take 1 tablet (40 mg total) daily at 6 PM by mouth. 08/28/17  Yes Glean Hess, MD  clopidogrel (PLAVIX) 75 MG tablet Take 75 mg by mouth daily. Stop one week prior to vocal cord surgery   Yes [provider]  docusate sodium (COLACE) 100 MG capsule Take 1 capsule (100 mg total) by mouth 2 (two) times daily. 08/15/17  Yes Vaughan Basta, MD  insulin NPH Human (HUMULIN N,NOVOLIN N) 100 UNIT/ML injection Inject 0.25 mLs (25 Units total) into the skin 2 (two) times  daily. Patient taking differently: Inject 30 Units into the skin 2 (two) times daily.  08/15/17  Yes Vaughan Basta, MD  insulin regular (NOVOLIN R,HUMULIN R) 100 units/mL injection Inject 10-15 Units into the skin 2 (two) times daily. 15 units in the morning & 10 units in the evening with supper   Yes [provider]  levothyroxine (SYNTHROID, LEVOTHROID) 300 MCG tablet Take 300 mcg by mouth daily before breakfast.   Yes [provider]  sodium bicarbonate 650 MG tablet Take 650 mg 2 (two) times daily by mouth.  04/28/17 04/28/18 Yes [provider]  tamsulosin (FLOMAX) 0.4 MG CAPS capsule Take 1 capsule (0.4 mg total) by mouth daily. 10/08/17  Yes Nickie Retort, MD  acetaminophen (TYLENOL) 500 MG tablet Take 1,000 mg every 6 (six) hours as needed by mouth for moderate pain or headache.    [provider]    Allergies Ace inhibitors and Quinapril  Family History  Problem Relation Age of Onset  . Cancer Mother        Lung  . Diabetes Mother   .  Heart disease Mother   . Heart disease Father     Social History Social History   Tobacco Use  . Smoking status: Former Smoker    Years: 1.00    Types: Cigarettes    Last attempt to quit: 06/24/1996    Years since quitting: 21.5  . Smokeless tobacco: Never Used  Substance Use Topics  . Alcohol use: No    Alcohol/week: 0.0 oz    Comment: rare  . Drug use: No    Review of Systems  Constitutional: No fever. Eyes: No visual changes. ENT: No sore throat. Cardiovascular: Denies chest pain. Respiratory: Denies shortness of breath. Gastrointestinal:  No diarrhea.  Genitourinary: Negative for dysuria or frequency.  Musculoskeletal: Negative for back pain. Skin: Negative for rash. Neurological: Negative for focal weakness or numbness.   ____________________________________________   PHYSICAL EXAM:  VITAL SIGNS: ED Triage Vitals  Enc Vitals Group     BP 12/23/17 1454 (!) 152/61      Pulse Rate 12/23/17 1454 67     Resp 12/23/17 1454 18     Temp 12/23/17 1454 97.8 F (36.6 C)     Temp Source 12/23/17 1454 Oral     SpO2 12/23/17 1454 97 %     Weight 12/23/17 1455 209 lb (94.8 kg)     Height 12/23/17 1455 6\' 2"  (1.88 m)     Head Circumference --      Peak Flow --      Pain Score 12/23/17 1455 0     Pain Loc --      Pain Edu? --      Excl. in [place]? --     Constitutional: Alert and oriented. Well appearing and in no acute distress. Eyes: Conjunctivae are normal.  Head: Atraumatic. Nose: No congestion/rhinnorhea. Mouth/Throat: Mucous membranes are moist.   Neck: Normal range of motion.  Cardiovascular:  Good peripheral circulation. Respiratory: Normal respiratory effort.   Gastrointestinal: No distention.  Musculoskeletal: No lower extremity edema.  Extremities warm and well perfused.  Neurologic:  Normal speech and language. No gross focal neurologic deficits are appreciated.  Skin:  Skin is warm and dry. No rash noted. Psychiatric: Mood and affect are normal. Speech and behavior are normal.  ____________________________________________   LABS (all labs ordered are listed, but only abnormal results are displayed)  Labs Reviewed  BASIC METABOLIC PANEL - Abnormal; Notable for the following components:      Result Value   Chloride 112 (*)    CO2 19 (*)    Glucose, Bld 152 (*)    BUN 41 (*)    Creatinine, Ser 3.08 (*)    Calcium 7.8 (*)    GFR calc non Af Amer 18 (*)    GFR calc Af Amer 21 (*)    All other components within normal limits  CBC - Abnormal; Notable for the following components:   RBC 3.75 (*)    Hemoglobin 11.7 (*)    HCT 35.4 (*)    RDW 15.6 (*)    Platelets 118 (*)    All other components within normal limits  POTASSIUM   ____________________________________________  EKG   ____________________________________________  RADIOLOGY    ____________________________________________   PROCEDURES  Procedure(s) performed:  No  Procedures  Critical Care performed: No ____________________________________________   INITIAL IMPRESSION / ASSESSMENT AND PLAN / ED COURSE  Pertinent labs & imaging results that were available during my care of the patient were reviewed by me and considered in my medical decision making (  see chart for details).  77 year old male presents referred to the emergency department for treatment of asymptomatic hyperkalemia.  Past medical records in epic reveal BMP showing potassium of 6.2 12 days ago and again 6 days ago.  On exam, vital signs are normal except for hypertension, the patient is well-appearing, and the remainder the exam is unremarkable.  Initial labs here revealed potassium of 4.9.  The remainder of the basic labs are within normal range for patient.  It is unclear whether the prior readings may have been spurious, such as due to hemolysis, versus mild hyperkalemia which resolved on its own.  We will repeat the potassium to verify that it is not in fact elevated.  If it is not, then patient will be safe for discharge home.    ----------------------------------------- 6:56 PM on 12/23/2017 -----------------------------------------  Repeat potassium is normal.  Patient does not require any treatment.  I discussed the test results with him and his wife and gave them return precautions.  He will follow-up with his regular doctors and is scheduled for surgery on the 19th.  ____________________________________________   FINAL CLINICAL IMPRESSION(S) / ED DIAGNOSES  Final diagnoses:  Abnormal laboratory test result      NEW MEDICATIONS STARTED DURING THIS VISIT:  New Prescriptions   No medications on file     Note:  This document was prepared using Dragon voice recognition software and may include unintentional dictation errors.    Arta Silence, MD 12/23/17 [phone]

## 2017-12-23 NOTE — ED Triage Notes (Signed)
Patient to ER for c/o "I need medications to get my potassium level down to have an operation and none of the pharmacies have it.".

## 2017-12-23 NOTE — Discharge Instructions (Signed)
Your potassium was checked twice today and it is normal.  We got results of 4.9 and 4.5.  You do not require any medication to treat your potassium level at this time.  Follow-up with your regular doctors and return to the emergency department for any new symptoms or other acute concerns.

## 2017-12-25 NOTE — Pre-Procedure Instructions (Signed)
PER DR Johnney Ou., USE ANTECUBITAL SPACE TO DRAW BLOOD AM SURGERY AND RELEASE TOURNIQUET BEFORE SPECIMEN OBTAINED

## 2017-12-26 ENCOUNTER — Encounter: Payer: Self-pay | Admitting: Urology

## 2017-12-26 ENCOUNTER — Other Ambulatory Visit: Payer: Self-pay | Admitting: Radiology

## 2017-12-26 ENCOUNTER — Ambulatory Visit: Payer: PPO | Admitting: Urology

## 2017-12-26 ENCOUNTER — Ambulatory Visit: Payer: PPO

## 2017-12-26 ENCOUNTER — Telehealth: Payer: Self-pay | Admitting: Radiology

## 2017-12-26 VITALS — BP 114/65 | HR 48 | Resp 16 | Ht 74.0 in | Wt 220.7 lb

## 2017-12-26 DIAGNOSIS — N1330 Unspecified hydronephrosis: Secondary | ICD-10-CM | POA: Diagnosis not present

## 2017-12-26 DIAGNOSIS — R339 Retention of urine, unspecified: Secondary | ICD-10-CM

## 2017-12-26 NOTE — Telephone Encounter (Signed)
Pt scheduled for ureteroscopy with stent placement with Dr Erlene Quan on 01/14/2018. Advised pt to continue ASA 81mg  but to hold Plavix x 7 days prior to surgery per Dr Pilar Jarvis & previous surgical clearance. Pt states he is not currently taking either d/t possible surgery with Dr Tami Ribas.

## 2017-12-26 NOTE — Progress Notes (Signed)
12/26/2017 10:59 AM   [name] 1941/05/28 [id number]  Referring provider: Glean Hess, [place], [place] [address]  No chief complaint on file.   HPI: 77 year old white male to discuss Lasix renogram results.   The patient has a long and complex history as described below:  He has history of simple prostatectomyandhis prostatic fossa was wide open at the time of recent ureteroscopy.   He was recent him into the hospital and urology was consulted for gross hematuria after catheterization.   At that time was noted to have right hydroureteronephrosis.  He did not have a catheter in that CT scan.  A Foley catheter was placed and he was discharged. Follow-up a renal ultrasound shows persistence of this hydronephrosis despite bladder decompression.  The patient was performing CIC.  However he noted that he got no more than 1 ounce out whenever he performed a postvoid CIC.  He was voiding well.  His only concern was episode of gross hematuria and the hydroureteronephrosis seen on recent CT.  He underwent a right carotid endarterectomy at the beginning of theOctober 2018and then fell resulting in a fracture of T12 a few weeks ago. He had progressive weak stream and hesitancy and feeling of incomplete emptying. He underwent CT scan which showed a distended bladder and small bilateral bladder diverticula and a small prostate. He also had acute kidney injury which looked to be prerenal. Baseline creatinine is around 2.8 or 3. A Foley catheter was placed. He is on tamsulosin.  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 Patient recalls gross hematuria after starting Plavix following the carotid endarterectomy. Urine was red. He had not had clots. Urine is light pink currently.  He underwent right ureteroscopy for further evaluation of the right renal pelvic mass.This turned out to be a stone versus fungal ball. It was not  a malignancy.Stone analysis appeared consistent with blood material with a few crystals too small to identify.  He underwent a UDS as described below which weak detrusor pressures but low PVR.  His Foley catheter was maintained.  However, his right hydroureteronephrosis persisted.  Due to this, he underwent a Lasix renogram with the Foley catheter in place which showed significant delayed washout of the right kidney despite Lasix.  This is per report as the software is not allowing me to review the images currently.   Previous UDS Results: Maximum capacity 381 cc First sensation 368 cc Max unstable detrusor contracture 9 cm of water First unstable contraction occurred at 381 cc Maximum flow rate was 12 cc/s Detrusor pressure max flow rate was 13 cm of water Max detrusor pressure of 14 cm of water PVR 65 cc The patient did technically pass his UDS trial of void, but his detrusor pressures were very weak.    PMH: Past Medical History:  Diagnosis Date  . Blood transfusion without reported diagnosis    patient unaware of receiving blood unless it was during surgery and he was not told  . Cancer [facility]) 2001   Colon resection  . Chronic kidney disease 11/2017   stage IV CKD per nephrologist.  . Complication of anesthesia    raspy voice since carotid endarterectomy 07/17/17. paralysis of vocal chords  . Coronary artery disease   . Diabetes mellitus without complication ([place])   . Dysrhythmia    patient unaware of any irregular heart rhythms  . Hyperlipidemia   . Hypertension   .  Hypothyroidism   . Kidney mass 2019  . Myocardial infarction ([place]) 1997  . Peripheral vascular disease ([place])   . Pneumonia 2014, 2018   developed after surgery 2018  . Stroke ([place]) 07/2017   mild stroke and then had carotid endarterectomy  . Thyroid disease   . Weight loss 2019   patient has lost over 60 pounds since 07/2017.     Surgical History: Past Surgical History:  Procedure Laterality Date    . APPENDECTOMY  2011  . BACK SURGERY    . CARDIAC CATHETERIZATION  1997  . CAROTID ANGIOGRAPHY Right 06/13/2017   Procedure: Right subclavian and Carotid Angiography, possible intervention;  Surgeon: Algernon Huxley, MD;  Location: [place] [facility];  Service: Cardiovascular;  Laterality: Right;  . CHOLECYSTECTOMY N/A 04/23/2016   had infection post surgery requiring him to debride daily  . 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 CORONARY ARTERY BYPASS GRAFT  1997   x 3  . CYSTOSCOPY W/ RETROGRADES Bilateral 09/03/2017   Procedure: CYSTOSCOPY WITH RETROGRADE PYELOGRAM;  Surgeon: Hollice Espy, MD;  Location: [facility] OR[facility];  Service: Urology;  Laterality: Bilateral;  . CYSTOSCOPY W/ URETERAL STENT REMOVAL  08/2017  . CYSTOSCOPY WITH STENT PLACEMENT Right 09/03/2017   Procedure: CYSTOSCOPY WITH STENT PLACEMENT;  Surgeon: Hollice Espy, MD;  Location: [facility] OR[facility];  Service: Urology;  Laterality: Right;  . ENDARTERECTOMY Right 07/17/2017   Procedure: ENDARTERECTOMY CAROTID;  Surgeon: Algernon Huxley, MD;  Location: [facility] OR[facility];  Service: Vascular;  Laterality: Right;  . HERNIA REPAIR  2011   Ventral hernia  . HOLMIUM LASER APPLICATION N/A 81/19/1478   Procedure: HOLMIUM LASER APPLICATION;  Surgeon: Hollice Espy, MD;  Location: [facility] OR[facility];  Service: Urology;  Laterality: N/A;  . KNEE SURGERY Left    arthroscopy  . KYPHOPLASTY N/A 08/14/2017   Procedure: [id number];  Surgeon: Hessie Knows, MD;  Location: [facility] ORS;  Service: Orthopedics;  Laterality: N/A;  . PROSTATE SURGERY  2002   BPH benign pathology  . SPINE SURGERY  1989   Lumbar disc    Home Medications:  Allergies as of 12/26/2017      Reactions   Ace Inhibitors Other (See Comments)   Reaction:  Raises potassium    Quinapril Rash, Other (See Comments)   hyperkalemia      Medication List        Accurate as of 12/26/17 10:59 AM. Always use your most recent med list.           acetaminophen 500 MG tablet Commonly known as:  TYLENOL Take 1,000 mg every 6 (six) hours as needed by mouth for moderate pain or headache.   aspirin 81 MG EC tablet Take 1 tablet (81 mg total) by mouth daily.   atenolol 50 MG tablet Commonly known as:  TENORMIN Take 1 tablet (50 mg total) by mouth daily.   atorvastatin 40 MG tablet Commonly known as:  LIPITOR Take 1 tablet (40 mg total) daily at 6 PM by mouth.   clopidogrel 75 MG tablet Commonly known as:  PLAVIX Take 75 mg by mouth daily. Stop one week prior to vocal cord surgery   insulin NPH Human 100 UNIT/ML injection Commonly known as:  HUMULIN N,NOVOLIN N Inject 0.25 mLs (25 Units total) into the skin 2 (two) times daily.   insulin regular 100 units/mL injection Commonly known as:  NOVOLIN R,HUMULIN R Inject 10-15 Units into the skin 2 (two) times daily. 15  units in the morning & 10 units in the evening with supper   levothyroxine 300 MCG tablet Commonly known as:  SYNTHROID, LEVOTHROID Take 300 mcg by mouth daily before breakfast.   sodium bicarbonate 650 MG tablet Take 650 mg 2 (two) times daily by mouth.   tamsulosin 0.4 MG Caps capsule Commonly known as:  FLOMAX Take 1 capsule (0.4 mg total) by mouth daily.       Allergies:  Allergies  Allergen Reactions  . Ace Inhibitors Other (See Comments)    Reaction:  Raises potassium   . Quinapril Rash and Other (See Comments)    hyperkalemia    Family History: Family History  Problem Relation Age of Onset  . Cancer Mother        Lung  . Diabetes Mother   . Heart disease Mother   . Heart disease Father     Social History:  reports that he quit smoking about 21 years ago. His smoking use included cigarettes. He quit after 1.00 year of use. he has never used smokeless tobacco. He reports that he does not drink alcohol or use drugs.  ROS: UROLOGY Frequent Urination?: No Hard to postpone urination?: No Burning/pain with urination?: No Get up at night  to urinate?: No Leakage of urine?: No Urine stream starts and stops?: No Trouble starting stream?: No Do you have to strain to urinate?: No Blood in urine?: No Urinary tract infection?: No Sexually transmitted disease?: No Injury to kidneys or bladder?: No Painful intercourse?: No Weak stream?: No Erection problems?: No Penile pain?: No  Gastrointestinal Nausea?: No Vomiting?: No Indigestion/heartburn?: No Diarrhea?: No Constipation?: No  Constitutional Fever: No Night sweats?: No Weight loss?: No Fatigue?: No  Skin Skin rash/lesions?: No Itching?: No  Eyes Blurred vision?: No Double vision?: No  Ears/Nose/Throat Sore throat?: No Sinus problems?: No  Hematologic/Lymphatic Swollen glands?: No Easy bruising?: No  Cardiovascular Leg swelling?: No Chest pain?: No  Respiratory Cough?: No Shortness of breath?: No  Endocrine Excessive thirst?: No  Musculoskeletal Back pain?: No Joint pain?: No  Neurological Headaches?: No Dizziness?: No  Psychologic Depression?: No Anxiety?: No  Physical Exam: BP 114/65   Pulse (!) 48   Resp 16   Ht 6\' 2"  (1.88 m)   Wt 220 lb 11.2 oz (100.1 kg)   SpO2 98%   BMI 28.34 kg/m   Constitutional:  Alert and oriented, No acute distress. HEENT: [place] AT, moist mucus membranes.  Trachea midline, no masses. Cardiovascular: No clubbing, cyanosis, or edema. Respiratory: Normal respiratory effort, no increased work of breathing. GI: Abdomen is soft, nontender, nondistended, no abdominal masses GU: No CVA tenderness.  Skin: No rashes, bruises or suspicious lesions. Lymph: No cervical or inguinal adenopathy. Neurologic: Grossly intact, no focal deficits, moving all 4 extremities. Psychiatric: Normal mood and affect.  Laboratory Data: Lab Results  Component Value Date   WBC 7.0 12/23/2017   HGB 11.7 (L) 12/23/2017   HCT 35.4 (L) 12/23/2017   MCV 94.5 12/23/2017   PLT 118 (L) 12/23/2017    Lab Results  Component  Value Date   CREATININE 3.08 (H) 12/23/2017    Lab Results  Component Value Date   PSA <0.1 05/16/2014    No results found for: TESTOSTERONE  Lab Results  Component Value Date   HGBA1C 7.1 (H) 08/11/2017    Urinalysis    Component Value Date/Time   COLORURINE RED (A) 11/13/2017 2022   APPEARANCEUR TURBID (A) 11/13/2017 2022   LABSPEC 1.015 11/13/2017 2022  PHURINE  11/13/2017 2022    TEST NOT REPORTED DUE TO COLOR INTERFERENCE OF URINE PIGMENT   GLUCOSEU (A) 11/13/2017 2022    TEST NOT REPORTED DUE TO COLOR INTERFERENCE OF URINE PIGMENT   HGBUR (A) 11/13/2017 2022    TEST NOT REPORTED DUE TO COLOR INTERFERENCE OF URINE PIGMENT   BILIRUBINUR (A) 11/13/2017 2022    TEST NOT REPORTED DUE TO COLOR INTERFERENCE OF URINE PIGMENT   KETONESUR (A) 11/13/2017 2022    TEST NOT REPORTED DUE TO COLOR INTERFERENCE OF URINE PIGMENT   PROTEINUR (A) 11/13/2017 2022    TEST NOT REPORTED DUE TO COLOR INTERFERENCE OF URINE PIGMENT   NITRITE (A) 11/13/2017 2022    TEST NOT REPORTED DUE TO COLOR INTERFERENCE OF URINE PIGMENT   LEUKOCYTESUR (A) 11/13/2017 2022    TEST NOT REPORTED DUE TO COLOR INTERFERENCE OF URINE PIGMENT    Assessment & Plan:    1.  Right obstructive hydroureteronephrosis I discussed the patient that his Lasix renogram showed persistent right hydroureteronephrosis despite maximal bladder drainage.  We discussed investigating this.  Potential causes include iatrogenic injury from previous instrumentation.  He has been through this procedure before.  We will have him scheduled for cystoscopy, right retrograde pyelogram, right ureteroscopy, and right ureteral stent placement to further evaluate why his right collecting system is not appropriately draining.  2.  History of urinary retention The patient was voiding with small PVRs prior to Foley placement for above.  We will remove his Foley catheter at this time.  For the next couple days, he will check a postvoid residual with  a catheter as he is very familiar CIC.  If this value is less than 200 cc, he will stop intermittent catheterization.  Nickie Retort, MD  [facility] [address] [place], [place] [address] [phone]

## 2017-12-31 ENCOUNTER — Other Ambulatory Visit: Payer: Self-pay | Admitting: *Deleted

## 2017-12-31 ENCOUNTER — Other Ambulatory Visit: Payer: Self-pay | Admitting: Radiology

## 2017-12-31 DIAGNOSIS — N1330 Unspecified hydronephrosis: Secondary | ICD-10-CM

## 2017-12-31 DIAGNOSIS — E875 Hyperkalemia: Secondary | ICD-10-CM | POA: Diagnosis not present

## 2017-12-31 NOTE — Patient Outreach (Signed)
[name]) Care Management  12/31/2017  [name] 03/22/1941 [id number]   Telephone follow up   Successful outreach call to patient wife, HIPAA verified, patient is on speaker phone his voice is still hoarse.   Wife discussed patient recent issues with elevated potassium, ENT surgery delayed until next week.  Wife discussed patient had repeat labs on today at [place], Nephrology, and hoping for lab level in normal range awaiting result report. . Wife reports they have been laying off bananas, tomatoes/juice foods with more potassium . Patient reports being off Plavix and aspirin in preparation for outpatient surgical procedure, patient hopeful procedure will help his voice.   Urinary concerns  Patient reports urinating without problems, self catheterizes once daily after urinating  and getting "very little" out  , just a corner in measuring container describes urine as clear. Patient discussed upcoming urinary procedure in April also.   Diabetes Not checking blood sugars reports he  taking insulin as prescribed, denies any hypoglycemia symptoms. Alc 7.5 on 2/19.  Hypertension Reports he is checking blood pressures every once in a while, unable to report recent readings.   Discussed with patient importance of follow up with PCP, patient and wife discussed the frequent visits to specialist, Nephrologist, Urologist, ENT and recent visit at endocrinologist.   Patient and wife denies any new concerns. Patient tolerating usual activity around the home.   Plan   RNCM will plan follow up call in the next week after procedure . Encouraged to notify MD office if they have not heard from results in the next day.    James Draft, RN, [place] Management Coordinator  [phone]- Mobile [phone]- Toll Free Main Office

## 2018-01-05 ENCOUNTER — Ambulatory Visit: Payer: PPO | Admitting: Anesthesiology

## 2018-01-05 ENCOUNTER — Ambulatory Visit
Admission: RE | Admit: 2018-01-05 | Discharge: 2018-01-05 | Disposition: A | Discharge status: Home / Self Care | Payer: PPO | Source: Ambulatory Visit | Attending: Unknown Physician Specialty | Admitting: Unknown Physician Specialty

## 2018-01-05 ENCOUNTER — Encounter: Payer: Self-pay | Admitting: *Deleted

## 2018-01-05 ENCOUNTER — Other Ambulatory Visit: Payer: Self-pay

## 2018-01-05 ENCOUNTER — Encounter
Admission: RE | Disposition: A | Discharge status: Home / Self Care | Payer: Self-pay | Source: Ambulatory Visit | Attending: Unknown Physician Specialty

## 2018-01-05 DIAGNOSIS — E119 Type 2 diabetes mellitus without complications: Secondary | ICD-10-CM | POA: Insufficient documentation

## 2018-01-05 DIAGNOSIS — Z79899 Other long term (current) drug therapy: Secondary | ICD-10-CM | POA: Insufficient documentation

## 2018-01-05 DIAGNOSIS — E039 Hypothyroidism, unspecified: Secondary | ICD-10-CM | POA: Diagnosis not present

## 2018-01-05 DIAGNOSIS — E1122 Type 2 diabetes mellitus with diabetic chronic kidney disease: Secondary | ICD-10-CM | POA: Diagnosis not present

## 2018-01-05 DIAGNOSIS — I252 Old myocardial infarction: Secondary | ICD-10-CM | POA: Diagnosis not present

## 2018-01-05 DIAGNOSIS — R490 Dysphonia: Secondary | ICD-10-CM | POA: Insufficient documentation

## 2018-01-05 DIAGNOSIS — I10 Essential (primary) hypertension: Secondary | ICD-10-CM | POA: Insufficient documentation

## 2018-01-05 DIAGNOSIS — Z794 Long term (current) use of insulin: Secondary | ICD-10-CM | POA: Diagnosis not present

## 2018-01-05 DIAGNOSIS — Z888 Allergy status to other drugs, medicaments and biological substances status: Secondary | ICD-10-CM | POA: Insufficient documentation

## 2018-01-05 DIAGNOSIS — J3801 Paralysis of vocal cords and larynx, unilateral: Secondary | ICD-10-CM | POA: Insufficient documentation

## 2018-01-05 DIAGNOSIS — Z87891 Personal history of nicotine dependence: Secondary | ICD-10-CM | POA: Diagnosis not present

## 2018-01-05 DIAGNOSIS — Z7982 Long term (current) use of aspirin: Secondary | ICD-10-CM | POA: Insufficient documentation

## 2018-01-05 DIAGNOSIS — Z8673 Personal history of transient ischemic attack (TIA), and cerebral infarction without residual deficits: Secondary | ICD-10-CM | POA: Diagnosis not present

## 2018-01-05 DIAGNOSIS — N184 Chronic kidney disease, stage 4 (severe): Secondary | ICD-10-CM | POA: Diagnosis not present

## 2018-01-05 DIAGNOSIS — E785 Hyperlipidemia, unspecified: Secondary | ICD-10-CM | POA: Diagnosis not present

## 2018-01-05 DIAGNOSIS — I2510 Atherosclerotic heart disease of native coronary artery without angina pectoris: Secondary | ICD-10-CM | POA: Diagnosis not present

## 2018-01-05 DIAGNOSIS — I129 Hypertensive chronic kidney disease with stage 1 through stage 4 chronic kidney disease, or unspecified chronic kidney disease: Secondary | ICD-10-CM | POA: Diagnosis not present

## 2018-01-05 HISTORY — PX: THYROPLASTY: SHX2512

## 2018-01-05 LAB — POCT I-STAT 4, (NA,K, GLUC, HGB,HCT)
GLUCOSE: 174 mg/dL — AB (ref 65–99)
HCT: 36 % — ABNORMAL LOW (ref 39.0–52.0)
Hemoglobin: 12.2 g/dL — ABNORMAL LOW (ref 13.0–17.0)
POTASSIUM: 5 mmol/L (ref 3.5–5.1)
Sodium: 140 mmol/L (ref 135–145)

## 2018-01-05 LAB — GLUCOSE, CAPILLARY
Glucose-Capillary: 143 mg/dL — ABNORMAL HIGH (ref 65–99)
Glucose-Capillary: 176 mg/dL — ABNORMAL HIGH (ref 65–99)

## 2018-01-05 SURGERY — THYROPLASTY
Anesthesia: Monitor Anesthesia Care | Laterality: Right

## 2018-01-05 MED ORDER — EPINEPHRINE PF 1 MG/ML IJ SOLN
INTRAMUSCULAR | Status: AC
  Filled 2018-01-05: qty 1

## 2018-01-05 MED ORDER — LIDOCAINE-EPINEPHRINE 1 %-1:100000 IJ SOLN
INTRAMUSCULAR | Status: AC
  Filled 2018-01-05: qty 1

## 2018-01-05 MED ORDER — CEPHALEXIN 500 MG PO CAPS: 500.0000 mg | ORAL_CAPSULE | Freq: Three times a day (TID) | ORAL | 0 refills | Status: DC

## 2018-01-05 MED ORDER — PROPOFOL 10 MG/ML IV BOLUS
INTRAVENOUS | Status: AC
  Filled 2018-01-05: qty 20

## 2018-01-05 MED ORDER — MIDAZOLAM HCL 2 MG/2ML IJ SOLN
INTRAMUSCULAR | Status: DC | PRN
  Administered 2018-01-05: 1 mg via INTRAVENOUS

## 2018-01-05 MED ORDER — FENTANYL CITRATE (PF) 100 MCG/2ML IJ SOLN: 25.0000 ug | INTRAMUSCULAR | Status: DC | PRN

## 2018-01-05 MED ORDER — PROPOFOL 10 MG/ML IV BOLUS
INTRAVENOUS | Status: DC | PRN
  Administered 2018-01-05 (×6): 20 mg via INTRAVENOUS
  Administered 2018-01-05: 30 mg via INTRAVENOUS

## 2018-01-05 MED ORDER — OXYCODONE-ACETAMINOPHEN 10-325 MG PO TABS: 1.0000 | ORAL_TABLET | Freq: Four times a day (QID) | ORAL | 0 refills | Status: DC | PRN

## 2018-01-05 MED ORDER — REMIFENTANIL HCL 1 MG IV SOLR
INTRAVENOUS | Status: AC
  Filled 2018-01-05: qty 1000

## 2018-01-05 MED ORDER — ONDANSETRON HCL 4 MG/2ML IJ SOLN: 4.0000 mg | Freq: Once | INTRAMUSCULAR | Status: DC | PRN

## 2018-01-05 MED ORDER — DEXAMETHASONE SODIUM PHOSPHATE 10 MG/ML IJ SOLN
INTRAMUSCULAR | Status: DC | PRN
  Administered 2018-01-05: 10 mg via INTRAVENOUS

## 2018-01-05 MED ORDER — BACITRACIN ZINC 500 UNIT/GM EX OINT
TOPICAL_OINTMENT | CUTANEOUS | Status: AC
  Filled 2018-01-05: qty 28.35

## 2018-01-05 MED ORDER — SUCCINYLCHOLINE CHLORIDE 20 MG/ML IJ SOLN
INTRAMUSCULAR | Status: AC
  Filled 2018-01-05: qty 1

## 2018-01-05 MED ORDER — SODIUM CHLORIDE 0.9 % IV SOLN
INTRAVENOUS | Status: DC
  Administered 2018-01-05: 07:00:00 via INTRAVENOUS

## 2018-01-05 MED ORDER — LIDOCAINE-EPINEPHRINE 1 %-1:100000 IJ SOLN
INTRAMUSCULAR | Status: DC | PRN
  Administered 2018-01-05: 1 mL
  Administered 2018-01-05: 5 mL

## 2018-01-05 MED ORDER — MIDAZOLAM HCL 2 MG/2ML IJ SOLN
INTRAMUSCULAR | Status: AC
  Filled 2018-01-05: qty 2

## 2018-01-05 MED ORDER — FENTANYL CITRATE (PF) 100 MCG/2ML IJ SOLN
INTRAMUSCULAR | Status: AC
  Filled 2018-01-05: qty 2

## 2018-01-05 MED ORDER — LIDOCAINE HCL (PF) 2 % IJ SOLN
INTRAMUSCULAR | Status: AC
  Filled 2018-01-05: qty 10

## 2018-01-05 MED ORDER — LIDOCAINE HCL (CARDIAC) 20 MG/ML IV SOLN
INTRAVENOUS | Status: DC | PRN
  Administered 2018-01-05: 50 mg via INTRAVENOUS

## 2018-01-05 MED ORDER — DEXAMETHASONE SODIUM PHOSPHATE 10 MG/ML IJ SOLN
INTRAMUSCULAR | Status: AC
  Filled 2018-01-05: qty 1

## 2018-01-05 MED ORDER — PHENYLEPHRINE HCL 10 MG/ML IJ SOLN
INTRAMUSCULAR | Status: AC
  Filled 2018-01-05: qty 1

## 2018-01-05 MED ORDER — ONDANSETRON HCL 4 MG/2ML IJ SOLN
INTRAMUSCULAR | Status: AC
  Filled 2018-01-05: qty 2

## 2018-01-05 SURGICAL SUPPLY — 43 items
BLADE SAW 2.5X18X0.5 (BLADE) ×2 IMPLANT
BLADE SURG 15 STRL LF DISP TIS (BLADE) ×1 IMPLANT
BLADE SURG 15 STRL SS (BLADE) ×1
CANISTER SUCT 1200ML W/VALVE (MISCELLANEOUS) ×2 IMPLANT
CORD BIP STRL DISP 12FT (MISCELLANEOUS) ×2 IMPLANT
DERMABOND ADVANCED (GAUZE/BANDAGES/DRESSINGS) ×1
DERMABOND ADVANCED .7 DNX12 (GAUZE/BANDAGES/DRESSINGS) ×1 IMPLANT
DRAPE MAG INST 16X20 L/F (DRAPES) ×2 IMPLANT
ELECT REM PT RETURN 9FT ADLT (ELECTROSURGICAL) ×2
ELECTRODE REM PT RTRN 9FT ADLT (ELECTROSURGICAL) ×1 IMPLANT
FORCEPS JEWEL BIP 4-3/4 STR (INSTRUMENTS) ×2 IMPLANT
GLOVE BIO SURGEON STRL SZ7.5 (GLOVE) ×2 IMPLANT
GOWN STRL REUS W/ TWL LRG LVL3 (GOWN DISPOSABLE) ×2 IMPLANT
GOWN STRL REUS W/TWL LRG LVL3 (GOWN DISPOSABLE) ×2
HOOK STAY BLUNT/RETRACTOR 5M (MISCELLANEOUS) IMPLANT
JACKSON PRATT 10 (INSTRUMENTS) IMPLANT
LABEL OR SOLS (LABEL) IMPLANT
LOOP RED MAXI  1X406MM (MISCELLANEOUS)
LOOP VESSEL MAXI 1X406 RED (MISCELLANEOUS) IMPLANT
MARKER SKIN DUAL TIP RULER LAB (MISCELLANEOUS) ×2 IMPLANT
NEEDLE FILTER BLUNT 18X 1/2SAF (NEEDLE) ×1
NEEDLE FILTER BLUNT 18X1 1/2 (NEEDLE) ×1 IMPLANT
NS IRRIG 500ML POUR BTL (IV SOLUTION) ×2 IMPLANT
PACK HEAD/NECK (MISCELLANEOUS) ×2 IMPLANT
PATTIES SURGICAL .5 X.5 (GAUZE/BANDAGES/DRESSINGS) ×2 IMPLANT
SHEARS HARMONIC 9CM CVD (BLADE) ×2 IMPLANT
SPONGE KITTNER 5P (MISCELLANEOUS) ×2 IMPLANT
SPONGE XRAY 4X4 16PLY STRL (MISCELLANEOUS) ×2 IMPLANT
STAPLER SKIN PROX 35W (STAPLE) ×2 IMPLANT
SUCTION FRAZIER HANDLE 10FR (MISCELLANEOUS) ×1
SUCTION TUBE FRAZIER 10FR DISP (MISCELLANEOUS) ×1 IMPLANT
SUT SILK 0 (SUTURE)
SUT SILK 0 30XBRD TIE 6 (SUTURE) IMPLANT
SUT SILK 2 0 (SUTURE)
SUT SILK 2-0 30XBRD TIE 12 (SUTURE) IMPLANT
SUT VIC AB 3-0 SH 27 (SUTURE)
SUT VIC AB 3-0 SH 27X BRD (SUTURE) IMPLANT
SUT VIC AB 4-0 RB1 18 (SUTURE) ×4 IMPLANT
SYR 3ML LL SCALE MARK (SYRINGE) ×2 IMPLANT
SYSTEM CHEST DRAIN TLS 7FR (DRAIN) ×2 IMPLANT
THYROPLASTY /INST/S (MISCELLANEOUS) ×2 IMPLANT
montgomery thyroplasty implant male 10 (Prosthesis and Implant ENT) ×2 IMPLANT
montgomery thyroplasty measuring kit ×2 IMPLANT

## 2018-01-05 NOTE — Anesthesia Post-op Follow-up Note (Signed)
Anesthesia QCDR form completed.        

## 2018-01-05 NOTE — Op Note (Signed)
01/05/2018  8:37 AM    [name]  [id number]   Pre-Op Dx: HOARSENESS/ DYSPHONIA; right vocal cord paralysis  Post-op Dx: SAME  Proc: Right Montgomery thyroplasty   Surg:  Roena Malady   Asst.: Juengel  Anes:  GOT  EBL:  Less than 10 cc  Comp:  None  Findings:  Normal anterior neck anatomy  Procedure: [name] was identified in the holding area taken the operating room placed in supine position. After mild IV sedation the neck was gently extended. Incision line was marked over the right thyroid cartilage alae.  A local anesthetic of 1% lidocaine with 1 100,000 units epinephrine was used to inject along the incision line.  The neck was then prepped and draped sterilely. A 15 blade was used to incise down to and through the platysma muscle. Hemostasis achieved using the Bovie cautery. The sternohyoid muscle was identified and due to its thickness it was divided using the Harmonic scalpel. This gave excellent access to the thyroid cartilage region. The thyrohyoid muscle was identified as it attached to the thyroid cartilage just was gently dissected free using the microbipolar. This gave excellent access to the right side of the entire thyroid cartilage including the thyroid notch. The inferior thyroid tubercle was identified in the key point was marked using the standard fashion described in the [facility] thyroplasty kit. The male window instruments were used at the key point to identify the window to be drilled in the thyroid cartilage. With the window marked using a surgical marker a Stryker oscillating saw used to create a small rectangular male window through the thyroid cartilage. The small piece of cartilage was removed and the perichondrium was gently elevated beneath the thyroid a left. A cottonoid pledget with 1 2000 adrenaline was placed within the window for hemostasis. With the pledget removed the male sizers were brought into the field and the sizing devices were placed  within the window from the 9 through the 12. While having the patient speak with each sizer placed the #10 and the best voice quality with minimal stridor. Therefore a #10 Montgomery implant was placed through the thyroid cartilage window and snapped into position. This gave excellent quality to his voice with no stridor. The wounds and copiously irrigated with saline any small bleeding points were cauterized using the microbipolar. The sternohyoid muscle was reapproximated using 4-0 Vicryl the platysma layer was closed using 4-0 Vicryl the subcutaneous taste tissues were closed using 4-0 Vicryl and skin was closed using Dermabond. Prior to closure a #7 TLS drain was brought of the wound inferiorly and affixed to the skin using a Tegaderm. The patient was taken to the recovery room in stable condition.  Dispo:   Good  Plan:  Discharge to home with instructions on how to change the drain ampules follow up tomorrow for drain removal.  Roena Malady  01/05/2018 8:37 AM

## 2018-01-05 NOTE — Discharge Instructions (Addendum)
° °  AMBULATORY SURGERY  DISCHARGE INSTRUCTIONS   1) The drugs that you were given will stay in your system until tomorrow so for the next 24 hours you should not:  A) Drive an automobile B) Make any legal decisions C) Drink any alcoholic beverage   2) You may resume regular meals tomorrow.  Today it is better to start with liquids and gradually work up to solid foods.  You may eat anything you prefer, but it is better to start with liquids, then soup and crackers, and gradually work up to solid foods.   3) Please notify your doctor immediately if you have any unusual bleeding, trouble breathing, redness and pain at the surgery site, drainage, fever, or pain not relieved by medication.    4) Additional Instructions:        Please contact your physician with any problems or Same Day Surgery at [phone], Monday through Friday 6 am to 4 pm, or [place] at [facility] number at [phone].     Please send home with extra drain tubes and instructions to change drain q6hr.  I will remove the drain tomorrow.

## 2018-01-05 NOTE — Transfer of Care (Signed)
Immediate Anesthesia Transfer of Care Note  Patient: [name]  Procedure(s) Performed: THYROPLASTY (Right )  Patient Location: PACU  Anesthesia Type:MAC  Level of Consciousness: awake, alert  and oriented  Airway & Oxygen Therapy: Patient Spontanous Breathing  Post-op Assessment: Report given to RN and Post -op Vital signs reviewed and stable  Post vital signs: Reviewed and stable  Last Vitals:  Vitals Value Taken Time  BP 149/67 01/05/2018  8:37 AM  Temp 36.1 C 01/05/2018  8:38 AM  Pulse 69 01/05/2018  8:38 AM  Resp 19 01/05/2018  8:38 AM  SpO2 100 % 01/05/2018  8:38 AM  Vitals shown include unvalidated device data.  Last Pain:  Vitals:   01/05/18 0710  TempSrc: Oral         Complications: No apparent anesthesia complications

## 2018-01-05 NOTE — Anesthesia Procedure Notes (Signed)
Procedure Name: MAC Performed by: Lance Muss, CRNA Pre-anesthesia Checklist: Patient identified, Emergency Drugs available, Suction available, Patient being monitored and Timeout performed Oxygen Delivery Method: Nasal cannula

## 2018-01-05 NOTE — OR Nursing (Signed)
Instructed wife in changing red neck tube per instructions. She reports good understanding and reports feeling comfortable performing this.BP 186/73. Dr. Baruch Merl notified and has cleared patient for discharge to home. Pt. Instructed to take his atenolol when he gets home today. Wife and patient agree to do this.

## 2018-01-05 NOTE — Anesthesia Preprocedure Evaluation (Signed)
Anesthesia Evaluation  Patient identified by MRN, date of birth, ID band Patient awake    Reviewed: Allergy & Precautions, H&P , NPO status , Patient's Chart, lab work & pertinent test results, reviewed documented beta blocker date and time   History of Anesthesia Complications (+) history of anesthetic complications  Airway Mallampati: III  TM Distance: >3 FB Neck ROM: full    Dental no notable dental hx. (+) Teeth Intact   Pulmonary neg pulmonary ROS, pneumonia, former smoker,    Pulmonary exam normal breath sounds clear to auscultation       Cardiovascular Exercise Tolerance: Poor hypertension, + CAD and + Past MI  negative cardio ROS  + dysrhythmias  Rhythm:regular Rate:Normal     Neuro/Psych  Neuromuscular disease CVA negative neurological ROS  negative psych ROS   GI/Hepatic negative GI ROS, Neg liver ROS,   Endo/Other  negative endocrine ROSdiabetesHypothyroidism   Renal/GU Renal disease     Musculoskeletal   Abdominal   Peds  Hematology negative hematology ROS (+)   Anesthesia Other Findings   Reproductive/Obstetrics negative OB ROS                             Anesthesia Physical Anesthesia Plan  ASA: III  Anesthesia Plan: MAC   Post-op Pain Management:    Induction:   PONV Risk Score and Plan:   Airway Management Planned:   Additional Equipment:   Intra-op Plan:   Post-operative Plan:   Informed Consent: I have reviewed the patients History and Physical, chart, labs and discussed the procedure including the risks, benefits and alternatives for the proposed anesthesia with the patient or authorized representative who has indicated his/her understanding and acceptance.     Plan Discussed with: CRNA  Anesthesia Plan Comments:         Anesthesia Quick Evaluation

## 2018-01-05 NOTE — H&P (Signed)
The patient's history has been reviewed, patient examined, no change in status, stable for surgery.  Questions were answered to the patients satisfaction.  

## 2018-01-06 ENCOUNTER — Other Ambulatory Visit: Payer: PPO

## 2018-01-06 DIAGNOSIS — N1330 Unspecified hydronephrosis: Secondary | ICD-10-CM

## 2018-01-06 LAB — URINALYSIS, COMPLETE
BILIRUBIN UA: NEGATIVE
KETONES UA: NEGATIVE
NITRITE UA: NEGATIVE
Specific Gravity, UA: 1.02 (ref 1.005–1.030)
Urobilinogen, Ur: 0.2 mg/dL (ref 0.2–1.0)
pH, UA: 6.5 (ref 5.0–7.5)

## 2018-01-06 LAB — MICROSCOPIC EXAMINATION: EPITHELIAL CELLS (NON RENAL): NONE SEEN /HPF (ref 0–10)

## 2018-01-06 NOTE — Anesthesia Postprocedure Evaluation (Signed)
Anesthesia Post Note  Patient: [name]  Procedure(s) Performed: THYROPLASTY (Right )  Patient location during evaluation: PACU Anesthesia Type: MAC Level of consciousness: awake and alert Pain management: pain level controlled Vital Signs Assessment: post-procedure vital signs reviewed and stable Respiratory status: spontaneous breathing, nonlabored ventilation, respiratory function stable and patient connected to nasal cannula oxygen Cardiovascular status: blood pressure returned to baseline and stable Postop Assessment: no apparent nausea or vomiting Anesthetic complications: no     Last Vitals:  Vitals:   01/05/18 0912 01/05/18 0940  BP: (!) 191/64 (!) 186/73  Pulse: 72 69  Resp: 18 18  Temp: (!) 36.4 C   SpO2: 98% 97%    Last Pain:  Vitals:   01/05/18 0940  TempSrc:   PainSc: 0-No pain                 Molli Barrows

## 2018-01-07 ENCOUNTER — Other Ambulatory Visit: Payer: Self-pay

## 2018-01-07 ENCOUNTER — Other Ambulatory Visit: Payer: PPO

## 2018-01-07 ENCOUNTER — Encounter
Admission: RE | Admit: 2018-01-07 | Discharge: 2018-01-07 | Disposition: A | Discharge status: Home / Self Care | Payer: PPO | Source: Ambulatory Visit | Attending: Urology | Admitting: Urology

## 2018-01-07 NOTE — Patient Instructions (Signed)
Your procedure is scheduled on: 01/14/18 Report to [facility]. To find out your arrival time please call [phone] between 1PM - 3PM on 01/13/18.  Remember: Instructions that are not followed completely may result in serious medical risk, up to and including death, or upon the discretion of your surgeon and anesthesiologist your surgery may need to be rescheduled.     _X__ 1. Do not eat food after midnight the night before your procedure.                 No gum chewing or hard candies. You may drink clear liquids up to 2 hours                 before you are scheduled to arrive for your surgery- DO not drink clear                 liquids within 2 hours of the start of your surgery.                 Clear Liquids include:  water, apple juice without pulp, clear carbohydrate                 drink such as Clearfast or Gatorade, Black Coffee or Tea (Do not add                 anything to coffee or tea).  __X__2.  On the morning of surgery brush your teeth with toothpaste and water, you                 may rinse your mouth with mouthwash if you wish.  Do not swallow any              toothpaste of mouthwash.     _X__ 3.  No Alcohol for 24 hours before or after surgery.   _X__ 4.  Do Not Smoke or use e-cigarettes For 24 Hours Prior to Your Surgery.                 Do not use any chewable tobacco products for at least 6 hours prior to                 surgery.  ____  5.  Bring all medications with you on the day of surgery if instructed.   __X__  6.  Notify your doctor if there is any change in your medical condition      (cold, fever, infections).     Do not wear jewelry, make-up, hairpins, clips or nail polish. Do not wear lotions, powders, or perfumes.  Do not shave 48 hours prior to surgery. Men may shave face and neck. Do not bring valuables to the hospital.    [facility] is not responsible for any belongings or  valuables.  Contacts, dentures/partials or body piercings may not be worn into surgery. Bring a case for your contacts, glasses or hearing aids, a denture cup will be supplied. Leave your suitcase in the car. After surgery it may be brought to your room. For patients admitted to the hospital, discharge time is determined by your treatment team.   Patients discharged the day of surgery will not be allowed to drive home.   Please read over the following fact sheets that you were given:     __X__ Take these medicines the morning of surgery with A SIP OF WATER:    1. atenolol  2. levothyroxine  3. tamsulosin  4.  5.  6.  ____ Fleet Enema (as directed)   __ __ Use CHG Soap/SAGE wipes as directed  ____ Use inhalers on the day of surgery  ____ Stop metformin/Janumet/Farxiga 2 days prior to surgery    __x__ Take 1/2 of usual insulin dose the night before surgery. No insulin the morning          of surgery.   __x__ Stop Blood Thinners Coumadin/Plavix/Xarelto/Pleta/Pradaxa/Eliquis/Effient/Aspirin  on   Or contact your Surgeon, Cardiologist or Medical Doctor regarding  ability to stop your blood thinners  __X__ Stop Anti-inflammatories 7 days before surgery such as Advil, Ibuprofen, Motrin,  BC or Goodies Powder, Naprosyn, Naproxen, Aleve, Aspirin    __X__ Stopall herbal supplements, fish oil or vitamin E until after surgery.    ____ Bring C-Pap to the hospital.

## 2018-01-09 ENCOUNTER — Other Ambulatory Visit: Payer: Self-pay | Admitting: *Deleted

## 2018-01-09 LAB — CULTURE, URINE COMPREHENSIVE

## 2018-01-09 NOTE — Patient Outreach (Signed)
[place] [facility]) Care Management  01/09/2018  HUMZAH HARTY January 09, 1941 [id number]   Telephone assessment call  Successful call to spoke with patient wife , [name]  HIPAA information verified . Wife discussed patient is doing well after recent ENT surgery and his has is voice back.  Spoke briefly with patient, voice sounds stronger than previous conversations. Patient discussed feeling stronger and outside working in the yard.  Patient discussed upcoming urology procedure on next week, otherwise he denies having any new concerns .  Plan Will plan follow up call in the next month, assess for further care management needs and plans.    [name], RN, [place] Management Coordinator  [phone]- Mobile [phone]- Toll Free Main Office

## 2018-01-13 MED ORDER — CEFAZOLIN SODIUM-DEXTROSE 2-4 GM/100ML-% IV SOLN: 2.0000 g | INTRAVENOUS | Status: DC

## 2018-01-14 ENCOUNTER — Encounter: Payer: Self-pay | Admitting: *Deleted

## 2018-01-14 ENCOUNTER — Encounter: Payer: Self-pay | Admitting: Certified Registered"

## 2018-01-14 ENCOUNTER — Encounter
Admission: RE | Disposition: A | Discharge status: Home / Self Care | Payer: Self-pay | Source: Ambulatory Visit | Attending: Urology

## 2018-01-14 ENCOUNTER — Ambulatory Visit
Admission: RE | Admit: 2018-01-14 | Discharge: 2018-01-14 | Disposition: A | Discharge status: Home / Self Care | Payer: PPO | Source: Ambulatory Visit | Attending: Urology | Admitting: Urology

## 2018-01-14 ENCOUNTER — Telehealth: Payer: Self-pay | Admitting: Internal Medicine

## 2018-01-14 DIAGNOSIS — N1330 Unspecified hydronephrosis: Secondary | ICD-10-CM

## 2018-01-14 SURGERY — CYSTOSCOPY WITH URETEROSCOPY
Anesthesia: Choice | Laterality: Right

## 2018-01-14 MED ORDER — HYDROMORPHONE HCL 1 MG/ML IJ SOLN
INTRAMUSCULAR | Status: AC
Start: 2018-01-14 — End: 2018-01-14
  Filled 2018-01-14: qty 2

## 2018-01-14 MED ORDER — CEFAZOLIN SODIUM-DEXTROSE 2-4 GM/100ML-% IV SOLN
INTRAVENOUS | Status: AC
  Filled 2018-01-14: qty 100

## 2018-01-14 MED ORDER — PROPOFOL 10 MG/ML IV BOLUS
INTRAVENOUS | Status: AC
Start: 2018-01-14 — End: 2018-01-14
  Filled 2018-01-14: qty 20

## 2018-01-14 MED ORDER — FAMOTIDINE 20 MG PO TABS
ORAL_TABLET | ORAL | Status: AC
  Filled 2018-01-14: qty 1

## 2018-01-14 SURGICAL SUPPLY — 21 items
BAG DRAIN CYSTO-URO LG1000N (MISCELLANEOUS) ×2 IMPLANT
BRUSH SCRUB EZ  4% CHG (MISCELLANEOUS) ×1
BRUSH SCRUB EZ 4% CHG (MISCELLANEOUS) ×1 IMPLANT
CATH URETL 5X70 OPEN END (CATHETERS) ×2 IMPLANT
CONRAY 43 FOR UROLOGY 50M (MISCELLANEOUS) ×2 IMPLANT
DRAPE UTILITY 15X26 TOWEL STRL (DRAPES) ×2 IMPLANT
GLOVE BIO SURGEON STRL SZ 6.5 (GLOVE) ×2 IMPLANT
GOWN STRL REUS W/ TWL LRG LVL3 (GOWN DISPOSABLE) ×2 IMPLANT
GOWN STRL REUS W/TWL LRG LVL3 (GOWN DISPOSABLE) ×2
KIT TURNOVER CYSTO (KITS) ×2 IMPLANT
PACK CYSTO AR (MISCELLANEOUS) ×2 IMPLANT
SENSORWIRE 0.038 NOT ANGLED (WIRE) ×2
SET CYSTO W/LG BORE CLAMP LF (SET/KITS/TRAYS/PACK) ×2 IMPLANT
SOL .9 NS 3000ML IRR  AL (IV SOLUTION) ×1
SOL .9 NS 3000ML IRR UROMATIC (IV SOLUTION) ×1 IMPLANT
STENT URET 6FRX24 CONTOUR (STENTS) IMPLANT
STENT URET 6FRX26 CONTOUR (STENTS) IMPLANT
SURGILUBE 2OZ TUBE FLIPTOP (MISCELLANEOUS) ×2 IMPLANT
SYRINGE IRR TOOMEY STRL 70CC (SYRINGE) ×2 IMPLANT
WATER STERILE IRR 1000ML POUR (IV SOLUTION) ×2 IMPLANT
WIRE SENSOR 0.038 NOT ANGLED (WIRE) ×1 IMPLANT

## 2018-01-14 NOTE — Telephone Encounter (Signed)
Called to schedule AWV with Nurse Health Advisor. Last AWV on 10/9 /17 please schedule AWV with NHA any date  Thank you! For questions please contact: Jill Alexanders [phone]  Skype [email or web address]

## 2018-01-15 ENCOUNTER — Other Ambulatory Visit: Payer: Self-pay | Admitting: Radiology

## 2018-01-15 DIAGNOSIS — N1330 Unspecified hydronephrosis: Secondary | ICD-10-CM

## 2018-01-21 ENCOUNTER — Other Ambulatory Visit: Payer: Self-pay | Admitting: *Deleted

## 2018-01-21 NOTE — Patient Outreach (Signed)
[place] [facility]) Care Management  01/21/2018  [name] 1941-06-08 [id number]   Telephone assessment follow up call  Placed call to patient his wife [name] answered phone call, HIPAA information verified. Only able to have a brief conversation, due to another family member with medical issues, she states patient is doing well, has cysto procedure in the next week. Wife request call in the next week after settled with that visit.  Unable to review progress on care plan at this time.    Plan Will plan follow up call in the next week.    [name], RN, [place] Management Coordinator  [phone]- Mobile [phone]- Toll Free Main Office

## 2018-01-22 DIAGNOSIS — E872 Acidosis, unspecified: Secondary | ICD-10-CM | POA: Insufficient documentation

## 2018-01-22 DIAGNOSIS — N184 Chronic kidney disease, stage 4 (severe): Secondary | ICD-10-CM | POA: Diagnosis not present

## 2018-01-22 DIAGNOSIS — E559 Vitamin D deficiency, unspecified: Secondary | ICD-10-CM | POA: Diagnosis not present

## 2018-01-22 DIAGNOSIS — N2581 Secondary hyperparathyroidism of renal origin: Secondary | ICD-10-CM | POA: Insufficient documentation

## 2018-01-26 ENCOUNTER — Other Ambulatory Visit: Payer: Self-pay | Admitting: Radiology

## 2018-01-26 DIAGNOSIS — N1330 Unspecified hydronephrosis: Secondary | ICD-10-CM

## 2018-01-27 MED ORDER — CEFAZOLIN SODIUM-DEXTROSE 2-4 GM/100ML-% IV SOLN
2.0000 g | INTRAVENOUS | Status: AC
  Administered 2018-01-28: 2 g via INTRAVENOUS
  Filled 2018-01-27: qty 100

## 2018-01-27 NOTE — Patient Instructions (Signed)
Your procedure is scheduled on: 01/28/18 Report to Day [facility]. [facility] SECOND FLOOR To find out your arrival time please call [phone] between 1PM - 3PM on 01/27/18.  Remember: Instructions that are not followed completely may result in serious medical risk, up to and including death, or upon the discretion of your surgeon and anesthesiologist your surgery may need to be rescheduled.     _X__ 1. Do not eat food after midnight the night before your procedure.                 No gum chewing or hard candies. You may drink clear liquids up to 2 hours                 before you are scheduled to arrive for your surgery- DO not drink clear                 liquids within 2 hours of the start of your surgery.                 Clear Liquids include:  water, apple juice without pulp, clear carbohydrate                 drink such as Clearfast of Gartorade, Black Coffee or Tea (Do not add                 anything to coffee or tea).  __X__2.  On the morning of surgery brush your teeth with toothpaste and water, you                 may rinse your mouth with mouthwash if you wish.  Do not swallow any              toothpaste of mouthwash.     _X__ 3.  No Alcohol for 24 hours before or after surgery.   _X__ 4.  Do Not Smoke or use e-cigarettes For 24 Hours Prior to Your Surgery.                 Do not use any chewable tobacco products for at least 6 hours prior to                 surgery.  ____  5.  Bring all medications with you on the day of surgery if instructed.   __X__  6.  Notify your doctor if there is any change in your medical condition      (cold, fever, infections).     Do not wear jewelry, make-up, hairpins, clips or nail polish. Do not wear lotions, powders, or perfumes. You may wear deodorant. Do not shave 48 hours prior to surgery. Men may shave face and neck. Do not bring valuables to the hospital.    [facility] is not responsible for any belongings or  valuables.  Contacts, dentures or bridgework may not be worn into surgery. Leave your suitcase in the car. After surgery it may be brought to your room. For patients admitted to the hospital, discharge time is determined by your treatment team.   Patients discharged the day of surgery will not be allowed to drive home.    __X__ Take these medicines the morning of surgery with A SIP OF WATER:    1. LEVOTHYROXINE  2. ATENOLOL  3. TAMSULOSIN  4.  5.  6.  ____ Fleet Enema (as directed)   ____ Use CHG Soap as directed  ____ Use inhalers on the day  of surgery  ____ Stop metformin 2 days prior to surgery    __X__ Take 1/2 of usual insulin dose the night before surgery. No insulin the morning          of surgery.   _X___ Stop Coumadin/Plavix/aspirin on  NO ASPIRIN UNTIL AFTER SURGERY  ____ Stop Anti-inflammatories on    ____ Stop supplements until after surgery.    ____ Bring C-Pap to the hospital.

## 2018-01-28 ENCOUNTER — Ambulatory Visit
Admission: RE | Admit: 2018-01-28 | Discharge: 2018-01-28 | Disposition: A | Discharge status: Home / Self Care | Payer: PPO | Source: Ambulatory Visit | Attending: Urology | Admitting: Urology

## 2018-01-28 ENCOUNTER — Ambulatory Visit: Payer: PPO | Admitting: Anesthesiology

## 2018-01-28 ENCOUNTER — Encounter: Payer: Self-pay | Admitting: Certified Registered Nurse Anesthetist

## 2018-01-28 ENCOUNTER — Encounter
Admission: RE | Disposition: A | Discharge status: Home / Self Care | Payer: Self-pay | Source: Ambulatory Visit | Attending: Urology

## 2018-01-28 DIAGNOSIS — I499 Cardiac arrhythmia, unspecified: Secondary | ICD-10-CM | POA: Diagnosis not present

## 2018-01-28 DIAGNOSIS — R634 Abnormal weight loss: Secondary | ICD-10-CM | POA: Insufficient documentation

## 2018-01-28 DIAGNOSIS — E039 Hypothyroidism, unspecified: Secondary | ICD-10-CM | POA: Diagnosis not present

## 2018-01-28 DIAGNOSIS — D4111 Neoplasm of uncertain behavior of right renal pelvis: Secondary | ICD-10-CM | POA: Diagnosis not present

## 2018-01-28 DIAGNOSIS — I252 Old myocardial infarction: Secondary | ICD-10-CM | POA: Diagnosis not present

## 2018-01-28 DIAGNOSIS — Z6827 Body mass index (BMI) 27.0-27.9, adult: Secondary | ICD-10-CM | POA: Insufficient documentation

## 2018-01-28 DIAGNOSIS — E785 Hyperlipidemia, unspecified: Secondary | ICD-10-CM | POA: Diagnosis not present

## 2018-01-28 DIAGNOSIS — Z86718 Personal history of other venous thrombosis and embolism: Secondary | ICD-10-CM | POA: Diagnosis not present

## 2018-01-28 DIAGNOSIS — I129 Hypertensive chronic kidney disease with stage 1 through stage 4 chronic kidney disease, or unspecified chronic kidney disease: Secondary | ICD-10-CM | POA: Diagnosis not present

## 2018-01-28 DIAGNOSIS — Z951 Presence of aortocoronary bypass graft: Secondary | ICD-10-CM | POA: Diagnosis not present

## 2018-01-28 DIAGNOSIS — N1330 Unspecified hydronephrosis: Secondary | ICD-10-CM

## 2018-01-28 DIAGNOSIS — N131 Hydronephrosis with ureteral stricture, not elsewhere classified: Secondary | ICD-10-CM | POA: Diagnosis not present

## 2018-01-28 DIAGNOSIS — Z7689 Persons encountering health services in other specified circumstances: Secondary | ICD-10-CM | POA: Diagnosis not present

## 2018-01-28 DIAGNOSIS — E1122 Type 2 diabetes mellitus with diabetic chronic kidney disease: Secondary | ICD-10-CM | POA: Diagnosis not present

## 2018-01-28 DIAGNOSIS — Z9049 Acquired absence of other specified parts of digestive tract: Secondary | ICD-10-CM | POA: Insufficient documentation

## 2018-01-28 DIAGNOSIS — E119 Type 2 diabetes mellitus without complications: Secondary | ICD-10-CM | POA: Diagnosis not present

## 2018-01-28 DIAGNOSIS — Z79899 Other long term (current) drug therapy: Secondary | ICD-10-CM | POA: Insufficient documentation

## 2018-01-28 DIAGNOSIS — I10 Essential (primary) hypertension: Secondary | ICD-10-CM | POA: Diagnosis not present

## 2018-01-28 DIAGNOSIS — I2510 Atherosclerotic heart disease of native coronary artery without angina pectoris: Secondary | ICD-10-CM | POA: Diagnosis not present

## 2018-01-28 DIAGNOSIS — Z85038 Personal history of other malignant neoplasm of large intestine: Secondary | ICD-10-CM | POA: Diagnosis not present

## 2018-01-28 DIAGNOSIS — N184 Chronic kidney disease, stage 4 (severe): Secondary | ICD-10-CM | POA: Diagnosis not present

## 2018-01-28 DIAGNOSIS — I493 Ventricular premature depolarization: Secondary | ICD-10-CM | POA: Diagnosis not present

## 2018-01-28 HISTORY — PX: CYSTOSCOPY W/ RETROGRADES: SHX1426

## 2018-01-28 HISTORY — PX: CYSTOSCOPY WITH BIOPSY: SHX5122

## 2018-01-28 HISTORY — PX: CYSTOSCOPY WITH STENT PLACEMENT: SHX5790

## 2018-01-28 HISTORY — PX: CYSTOSCOPY WITH URETEROSCOPY: SHX5123

## 2018-01-28 LAB — POCT I-STAT 4, (NA,K, GLUC, HGB,HCT)
GLUCOSE: 111 mg/dL — AB (ref 65–99)
HEMATOCRIT: 34 % — AB (ref 39.0–52.0)
Hemoglobin: 11.6 g/dL — ABNORMAL LOW (ref 13.0–17.0)
POTASSIUM: 4.6 mmol/L (ref 3.5–5.1)
Sodium: 140 mmol/L (ref 135–145)

## 2018-01-28 LAB — GLUCOSE, CAPILLARY
GLUCOSE-CAPILLARY: 120 mg/dL — AB (ref 65–99)
Glucose-Capillary: 116 mg/dL — ABNORMAL HIGH (ref 65–99)

## 2018-01-28 SURGERY — CYSTOSCOPY WITH URETEROSCOPY
Anesthesia: General | Site: Ureter | Laterality: Right | Wound class: Clean Contaminated

## 2018-01-28 MED ORDER — FENTANYL CITRATE (PF) 100 MCG/2ML IJ SOLN: 25.0000 ug | INTRAMUSCULAR | Status: DC | PRN

## 2018-01-28 MED ORDER — FENTANYL CITRATE (PF) 100 MCG/2ML IJ SOLN
INTRAMUSCULAR | Status: DC | PRN
  Administered 2018-01-28: 50 ug via INTRAVENOUS

## 2018-01-28 MED ORDER — GLYCOPYRROLATE 0.2 MG/ML IJ SOLN
INTRAMUSCULAR | Status: DC | PRN
  Administered 2018-01-28: .7 mg via INTRAVENOUS

## 2018-01-28 MED ORDER — ROCURONIUM BROMIDE 50 MG/5ML IV SOLN
INTRAVENOUS | Status: AC
  Filled 2018-01-28: qty 1

## 2018-01-28 MED ORDER — IOTHALAMATE MEGLUMINE 43 % IV SOLN
INTRAVENOUS | Status: DC | PRN
  Administered 2018-01-28: 15 mL via URETHRAL

## 2018-01-28 MED ORDER — GLYCOPYRROLATE 0.2 MG/ML IJ SOLN
INTRAMUSCULAR | Status: AC
  Filled 2018-01-28: qty 2

## 2018-01-28 MED ORDER — PROPOFOL 10 MG/ML IV BOLUS
INTRAVENOUS | Status: DC | PRN
  Administered 2018-01-28: 140 mg via INTRAVENOUS

## 2018-01-28 MED ORDER — FAMOTIDINE 20 MG PO TABS
ORAL_TABLET | ORAL | Status: AC
  Administered 2018-01-28: 20 mg via ORAL
  Filled 2018-01-28: qty 1

## 2018-01-28 MED ORDER — DEXAMETHASONE SODIUM PHOSPHATE 10 MG/ML IJ SOLN
INTRAMUSCULAR | Status: DC | PRN
  Administered 2018-01-28: 6 mg via INTRAVENOUS

## 2018-01-28 MED ORDER — NEOSTIGMINE METHYLSULFATE 10 MG/10ML IV SOLN
INTRAVENOUS | Status: AC
Start: 2018-01-28 — End: 2018-01-28
  Filled 2018-01-28: qty 1

## 2018-01-28 MED ORDER — PHENYLEPHRINE HCL 10 MG/ML IJ SOLN
INTRAMUSCULAR | Status: DC | PRN
  Administered 2018-01-28: 200 ug via INTRAVENOUS
  Administered 2018-01-28 (×2): 100 ug via INTRAVENOUS
  Administered 2018-01-28: 200 ug via INTRAVENOUS
  Administered 2018-01-28: 100 ug via INTRAVENOUS

## 2018-01-28 MED ORDER — DOCUSATE SODIUM 100 MG PO CAPS: 100.0000 mg | ORAL_CAPSULE | Freq: Two times a day (BID) | ORAL | 0 refills | Status: DC

## 2018-01-28 MED ORDER — ROCURONIUM BROMIDE 100 MG/10ML IV SOLN
INTRAVENOUS | Status: DC | PRN
  Administered 2018-01-28: 10 mg via INTRAVENOUS
  Administered 2018-01-28: 40 mg via INTRAVENOUS
  Administered 2018-01-28: 10 mg via INTRAVENOUS

## 2018-01-28 MED ORDER — GLYCOPYRROLATE 0.2 MG/ML IJ SOLN
INTRAMUSCULAR | Status: AC
  Filled 2018-01-28: qty 1

## 2018-01-28 MED ORDER — HYDROCODONE-ACETAMINOPHEN 5-325 MG PO TABS: 1.0000 | ORAL_TABLET | Freq: Four times a day (QID) | ORAL | 0 refills | Status: DC | PRN

## 2018-01-28 MED ORDER — OXYBUTYNIN CHLORIDE 5 MG PO TABS: 5.0000 mg | ORAL_TABLET | Freq: Three times a day (TID) | ORAL | 0 refills | Status: DC | PRN

## 2018-01-28 MED ORDER — DEXAMETHASONE SODIUM PHOSPHATE 10 MG/ML IJ SOLN
INTRAMUSCULAR | Status: AC
  Filled 2018-01-28: qty 1

## 2018-01-28 MED ORDER — ONDANSETRON HCL 4 MG/2ML IJ SOLN
INTRAMUSCULAR | Status: DC | PRN
  Administered 2018-01-28: 4 mg via INTRAVENOUS

## 2018-01-28 MED ORDER — CEFAZOLIN SODIUM-DEXTROSE 2-3 GM-%(50ML) IV SOLR
INTRAVENOUS | Status: AC
  Filled 2018-01-28: qty 50

## 2018-01-28 MED ORDER — LIDOCAINE HCL (PF) 2 % IJ SOLN
INTRAMUSCULAR | Status: AC
  Filled 2018-01-28: qty 10

## 2018-01-28 MED ORDER — FENTANYL CITRATE (PF) 100 MCG/2ML IJ SOLN
INTRAMUSCULAR | Status: AC
  Filled 2018-01-28: qty 2

## 2018-01-28 MED ORDER — FAMOTIDINE 20 MG PO TABS
20.0000 mg | ORAL_TABLET | Freq: Once | ORAL | Status: AC
  Administered 2018-01-28: 20 mg via ORAL

## 2018-01-28 MED ORDER — LIDOCAINE HCL (CARDIAC) PF 100 MG/5ML IV SOSY
PREFILLED_SYRINGE | INTRAVENOUS | Status: DC | PRN
  Administered 2018-01-28: 60 mg via INTRAVENOUS

## 2018-01-28 MED ORDER — ONDANSETRON HCL 4 MG/2ML IJ SOLN
INTRAMUSCULAR | Status: AC
  Filled 2018-01-28: qty 2

## 2018-01-28 MED ORDER — PROPOFOL 10 MG/ML IV BOLUS
INTRAVENOUS | Status: AC
  Filled 2018-01-28: qty 20

## 2018-01-28 MED ORDER — ONDANSETRON HCL 4 MG/2ML IJ SOLN: 4.0000 mg | Freq: Once | INTRAMUSCULAR | Status: DC | PRN

## 2018-01-28 MED ORDER — SODIUM CHLORIDE 0.9 % IV SOLN
INTRAVENOUS | Status: DC
  Administered 2018-01-28 (×2): via INTRAVENOUS

## 2018-01-28 MED ORDER — NEOSTIGMINE METHYLSULFATE 10 MG/10ML IV SOLN
INTRAVENOUS | Status: DC | PRN
Start: 2018-01-28 — End: 2018-01-28
  Administered 2018-01-28: 4 mg via INTRAVENOUS

## 2018-01-28 SURGICAL SUPPLY — 28 items
BAG DRAIN CYSTO-URO LG1000N (MISCELLANEOUS) ×2 IMPLANT
BRUSH CYTOLOGY 3FR 115L (MISCELLANEOUS) IMPLANT
BRUSH SCRUB EZ  4% CHG (MISCELLANEOUS) ×1
BRUSH SCRUB EZ 4% CHG (MISCELLANEOUS) ×1 IMPLANT
CATH URETL 5X70 OPEN END (CATHETERS) ×2 IMPLANT
CONRAY 43 FOR UROLOGY 50M (MISCELLANEOUS) ×2 IMPLANT
DRAPE UTILITY 15X26 TOWEL STRL (DRAPES) ×2 IMPLANT
DRSG TELFA 4X3 1S NADH ST (GAUZE/BANDAGES/DRESSINGS) ×2 IMPLANT
FORCEPS BIOP PIRANHA Y (CUTTING FORCEPS) ×2 IMPLANT
GLOVE BIO SURGEON STRL SZ 6.5 (GLOVE) ×2 IMPLANT
GOWN STRL REUS W/ TWL LRG LVL3 (GOWN DISPOSABLE) ×2 IMPLANT
GOWN STRL REUS W/TWL LRG LVL3 (GOWN DISPOSABLE) ×2
GUIDEWIRE GREEN .038 145CM (MISCELLANEOUS) ×2 IMPLANT
KIT TURNOVER CYSTO (KITS) ×2 IMPLANT
PACK CYSTO AR (MISCELLANEOUS) ×2 IMPLANT
SENSORWIRE 0.038 NOT ANGLED (WIRE) ×2
SET CYSTO W/LG BORE CLAMP LF (SET/KITS/TRAYS/PACK) ×2 IMPLANT
SHEATH URETERAL 12FR 45CM (SHEATH) ×2 IMPLANT
SOL .9 NS 3000ML IRR  AL (IV SOLUTION) ×1
SOL .9 NS 3000ML IRR UROMATIC (IV SOLUTION) ×1 IMPLANT
STENT URET 6FRX24 CONTOUR (STENTS) IMPLANT
STENT URET 6FRX26 CONTOUR (STENTS) IMPLANT
STENT URO INLAY 6FRX26CM (STENTS) ×2 IMPLANT
SURGILUBE 2OZ TUBE FLIPTOP (MISCELLANEOUS) ×2 IMPLANT
SYRINGE IRR TOOMEY STRL 70CC (SYRINGE) ×2 IMPLANT
UroMax Ultra ×2 IMPLANT
WATER STERILE IRR 1000ML POUR (IV SOLUTION) ×2 IMPLANT
WIRE SENSOR 0.038 NOT ANGLED (WIRE) ×1 IMPLANT

## 2018-01-28 NOTE — Op Note (Signed)
Date of procedure: 01/28/18  Preoperative diagnosis:  1. Right hydronephrosis   Postoperative diagnosis:  1. Same 2. 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 Right retrograde pyelogram 2. Right ureteroscopy 3. Right UPJ/proximal ureteral biopsy 4. Right selective cytology 5. Right ureteral stent placement 6. Right balloon dilation   Surgeon: Hollice Espy, MD  Anesthesia: General  Complications: None  Intraoperative findings: Narrowing within the proximal ureter with some nonspecific changes of the mucosa, no obvious tumors or papillary fronds.  Proximal hydroureteronephrosis with tapering at the UPJ.  EBL: Minimal  Specimens: Right renal pelvic cytology, right UPJ/proximal ureteral biopsies  Drains: 6 x 26 French Bard Optima ureteral stent on right  Indication: [name] is a 77 y.o. 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  After reviewing the management options for treatment, he elected to proceed with the above surgical procedure(s). We have discussed the potential benefits and risks of the procedure, side effects of the proposed treatment, the likelihood of the patient achieving the goals of the procedure, and any potential problems that might occur during the procedure or recuperation. Informed consent has been obtained.  Description of procedure:  The patient was taken to the operating room and general anesthesia was induced.  The patient was placed in the dorsal lithotomy position, prepped and draped in the usual sterile fashion, and preoperative antibiotics were administered. A preoperative time-out was performed.   A 21 French scope was advanced per urethra into the bladder.  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  Inspection of the bladder revealed trabeculation with large bilateral diverticula which were otherwise unremarkable.   Attention was turned to the right ureteral orifice which was cannulated using a 5 [place] open-ended ureteral catheter just within the UO.  Gentle retrograde pyelogram was performed on the side which revealed  moderate hydroureteronephrosis with a beaking of the renal pelvis down to level of the UPJ and is several centimeter area of narrowing with dilation of the ureter distal to this area.  On delayed imaging, the ureter drained but residual contrast material was left within the collecting system.  A wire was in place up to the level of the renal pelvis without difficulty.  A second Super Stiff wire was then introduced without difficulty as well.  The sensor wire stent in place as a safety wire.  There was able to advance a 8 French flexible digital ureteroscope into the renal pelvis.  I did have some slight difficulty at the level of the narrowing but ultimately with back tension on the wire, I was able to advance the scope relatively easily into the collecting system.  Formal pyeloscopy was then performed visualizing each every calyx which was otherwise unremarkable.  The renal pelvis was then carefully inspected and noted to have some very subtle cobblestoning type changes without any obvious tumors or lesions at this level.  In light of these changes, I did aspirate her urine cytology as well as perform brush cytologies of the area which were sent as right renal pelvic cytology.  I then used Parona forceps to take extremely small minute bites of the UPJ and proximal ureter, a total of 3 tissue specimens were obtained which are very small.  I did discuss this with pathology and the specimens were sent down fresh given the minute size.  The finally, in light of this strictured, narrowed area of unclear etiology, it  did go ahead and elect to balloon dilate the strictured area using a [employer] 4 cm ureteral dilator balloon up to 15 [place] using a total of 18 atm and the balloon for several minutes time.  I  then replaced the scope to inspect the proximal ureter which appeared to be intact and were widely patent and eat more easily traversable.  There is no obvious ureteral injuries appreciated from the balloon dilation.  A final retrograde pyelogram was performed which showed no contrast extravasation and more prompt excretion of the contrast material than before the balloon dilation.  Finally, the safety wire was backloaded over rigid cystoscope.  A 6 x 26 French double-J Bard Optima ureteral stent was then advanced over the wire to the renal pelvis.  The wires partially drawn until full closed over the renal pelvis.  The wires and fluid drawn full coils noted within the bladder.  The bladder was then drained.  The patient was then clean and dry, repositioned in supine position, reversed from anesthesia, and taken to PACU in stable condition.  Plan: I discussed the findings with the patient's wife extensively.  Etiology of the strictured area remains unclear.  Reexamination of old CT scans show a tapering of the ureter at this location dating back to 2017 on scans which appears to have progressed.  It appears to be in and around the location of the gonadal vessels and incidentally, intraoperatively pulsation was seen at this area.  We will plan for follow-up in 4 weeks to discuss management options.  This could include chronic indwelling ureteral stent, removal of stent with observation, or more definitive surgery if deemed necessary.  All questions were answered.  Hollice Espy, M.D.

## 2018-01-28 NOTE — Anesthesia Preprocedure Evaluation (Signed)
Anesthesia Evaluation  Patient identified by MRN, date of birth, ID band Patient awake    Reviewed: Allergy & Precautions, H&P , NPO status , Patient's Chart, lab work & pertinent test results, reviewed documented beta blocker date and time   History of Anesthesia Complications (+) history of anesthetic complications  Airway Mallampati: II  TM Distance: >3 FB Neck ROM: full    Dental  (+) Teeth Intact   Pulmonary neg pulmonary ROS, pneumonia, resolved, former smoker,    Pulmonary exam normal        Cardiovascular Exercise Tolerance: Good hypertension, On Medications + CAD and + Past MI  negative cardio ROS Normal cardiovascular exam+ dysrhythmias  Rhythm:regular Rate:Normal     Neuro/Psych  Neuromuscular disease CVA, No Residual Symptoms negative neurological ROS  negative psych ROS   GI/Hepatic negative GI ROS, Neg liver ROS,   Endo/Other  negative endocrine ROSdiabetesHypothyroidism   Renal/GU Renal diseasenegative Renal ROS  negative genitourinary   Musculoskeletal   Abdominal   Peds  Hematology negative hematology ROS (+)   Anesthesia Other Findings Past Medical History: No date: Blood transfusion without reported diagnosis     Comment:  patient unaware of receiving blood unless it was during               surgery and he was not told 2001: Cancer ([place])     Comment:  Colon resection 11/2017: Chronic kidney disease     Comment:  stage IV CKD per nephrologist. No date: Complication of anesthesia     Comment:  raspy voice since carotid endarterectomy 07/17/17.               paralysis of vocal chords No date: Coronary artery disease No date: Diabetes mellitus without complication ([place]) No date: Dysrhythmia     Comment:  patient unaware of any irregular heart rhythms No date: Hyperlipidemia No date: Hypertension No date: Hypothyroidism 2019: Kidney mass 1997: Myocardial infarction [facility]) No date:  Peripheral vascular disease ([place]) 2014, 2018: Pneumonia     Comment:  developed after surgery 2018 07/2017: Stroke [facility])     Comment:  mild stroke and then had carotid endarterectomy No date: Thyroid disease 2019: Weight loss     Comment:  patient has lost over 60 pounds since 07/2017.  Past Surgical History: 2011: APPENDECTOMY No date: BACK SURGERY 1997: CARDIAC CATHETERIZATION 06/13/2017: CAROTID ANGIOGRAPHY; Right     Comment:  Procedure: Right subclavian and Carotid Angiography,               possible intervention;  Surgeon: Algernon Huxley, MD;                Location: [facility];  Service: Cardiovascular;              Laterality: Right; 04/23/2016: CHOLECYSTECTOMY; N/A     Comment:  had infection post surgery requiring him to debride               daily 2011: COLON SURGERY     Comment:  Colectomy for ileo-cecal valve cancer, also took               appendix 1997: CORONARY ARTERY BYPASS GRAFT     Comment:  x 3 09/03/2017: CYSTOSCOPY W/ RETROGRADES; Bilateral     Comment:  Procedure: CYSTOSCOPY WITH RETROGRADE PYELOGRAM;                Surgeon: Hollice Espy, MD;  Location: [facility];  Service: Urology;  Laterality: Bilateral; 08/2017: CYSTOSCOPY W/ URETERAL STENT REMOVAL 09/03/2017: CYSTOSCOPY WITH STENT PLACEMENT; Right     Comment:  Procedure: CYSTOSCOPY WITH STENT PLACEMENT;  Surgeon:               Hollice Espy, MD;  Location: [facility];  Service:               Urology;  Laterality: Right; 07/17/2017: ENDARTERECTOMY; Right     Comment:  Procedure: ENDARTERECTOMY CAROTID;  Surgeon: Algernon Huxley, MD;  Location: [facility];  Service: Vascular;                Laterality: Right; 2011: HERNIA REPAIR     Comment:  Ventral hernia 09/03/2017: HOLMIUM LASER APPLICATION; N/A     Comment:  Procedure: HOLMIUM LASER APPLICATION;  Surgeon: Hollice Espy, MD;  Location: [facility];  Service: Urology;                Laterality: N/A; No  date: KNEE SURGERY; Left     Comment:  arthroscopy 08/14/2017: KYPHOPLASTY; N/A     Comment:  Procedure: [id number];  Surgeon: Hessie Knows, MD;              Location: [facility];  Service: Orthopedics;  Laterality:               N/A; No date: LARYNX SURGERY 2002: PROSTATE SURGERY     Comment:  BPH benign pathology 1989: SPINE SURGERY     Comment:  Lumbar disc 01/05/2018: THYROPLASTY; Right     Comment:  Procedure: THYROPLASTY;  Surgeon: Beverly Gust, MD;               Location: [facility];  Service: ENT;  Laterality: Right; BMI    Body Mass Index:  27.60 kg/m     Reproductive/Obstetrics negative OB ROS                             Anesthesia Physical Anesthesia Plan  ASA: III  Anesthesia Plan: General ETT   Post-op Pain Management:    Induction:   PONV Risk Score and Plan:   Airway Management Planned: Video Laryngoscope Planned  Additional Equipment:   Intra-op Plan:   Post-operative Plan:   Informed Consent: I have reviewed the patients History and Physical, chart, labs and discussed the procedure including the risks, benefits and alternatives for the proposed anesthesia with the patient or authorized representative who has indicated his/her understanding and acceptance.   Dental Advisory Given  Plan Discussed with: CRNA  Anesthesia Plan Comments: (Discussed situation with Dr. Erlene Quan.  Initial thought was for SAB but because of surgical needs, we will need GOT with intraoperative paralysis.  JA)        Anesthesia Quick Evaluation

## 2018-01-28 NOTE — Anesthesia Post-op Follow-up Note (Signed)
Anesthesia QCDR form completed.        

## 2018-01-28 NOTE — Discharge Instructions (Signed)

## 2018-01-28 NOTE — Anesthesia Procedure Notes (Addendum)
Procedure Name: Intubation Performed by: Eben Burow, CRNA Pre-anesthesia Checklist: Patient identified, Emergency Drugs available, Suction available, Patient being monitored and Timeout performed Patient Re-evaluated:Patient Re-evaluated prior to induction Oxygen Delivery Method: Circle system utilized and Simple face mask Preoxygenation: Pre-oxygenation with 100% oxygen Induction Type: IV induction Ventilation: Mask ventilation with difficulty and Oral airway inserted - appropriate to patient size Laryngoscope Size: Glidescope and 3 Tube type: Oral Tube size: 6.5 mm Number of attempts: 1 Airway Equipment and Method: Stylet Placement Confirmation: ETT inserted through vocal cords under direct vision,  positive ETCO2 and breath sounds checked- equal and bilateral Secured at: 22 cm Tube secured with: Tape Dental Injury: Teeth and Oropharynx as per pre-operative assessment  Comments: Easy atraumatic DL with BS = B and able to avoid right side stent from thyroplasty. JA

## 2018-01-28 NOTE — Transfer of Care (Signed)
Immediate Anesthesia Transfer of Care Note  Patient: STANISLAW ACTON  Procedure(s) Performed: CYSTOSCOPY WITH URETEROSCOPY (Right Ureter) CYSTOSCOPY WITH RETROGRADE PYELOGRAM (Right Ureter) CYSTOSCOPY WITH STENT PLACEMENT (Right Ureter) CYSTOSCOPY WITH BIOPSY (Right Ureter)  Patient Location: PACU  Anesthesia Type:General  Level of Consciousness: awake, alert , oriented and patient cooperative  Airway & Oxygen Therapy: Patient Spontanous Breathing and Patient connected to face mask oxygen  Post-op Assessment: Report given to RN and Post -op Vital signs reviewed and stable  Post vital signs: Reviewed and stable  Last Vitals:  Vitals Value Taken Time  BP 187/99 01/28/2018  1:15 PM  Temp 36.2 C 01/28/2018  1:15 PM  Pulse 67 01/28/2018  1:15 PM  Resp 17 01/28/2018  1:15 PM  SpO2 100 % 01/28/2018  1:15 PM    Last Pain:  Vitals:   01/28/18 1315  TempSrc:   PainSc: (P) 0-No pain         Complications: No apparent anesthesia complications

## 2018-01-28 NOTE — H&P (Signed)
12/26/2017 --> updated 01/28/18 R[facility] CTAB  [name] 07/19/1941 [id number]  Referring provider: Glean Hess, [address], [place] [address]  No chief complaint on file.   HPI: 77 year old white male to discuss Lasix renogram results.   The patient has a long and complex history as described below:  He has history of simple prostatectomyandhis prostatic fossa was wide open at the time of recent ureteroscopy.   He was recent him into the hospital and urology was consulted for gross hematuria after catheterization. At that time was noted to have right hydroureteronephrosis. He did not have a catheter in that CT scan. A Foley catheter was placed and he was discharged. Follow-up a renal ultrasound shows persistence of this hydronephrosis despite bladder decompression.  The patient was performing CIC. However he noted that he got no more than 1 ounce out whenever he performed a postvoid CIC. He was voiding well. His only concern was episode of gross hematuria and the hydroureteronephrosis seen on recent CT.  He underwent a right carotid endarterectomy at the beginning of theOctober 2018and then fell resulting in a fracture of T12 a few weeks ago. He had progressive weak stream and hesitancy and feeling of incomplete emptying. He underwent CT scan which showed a distended bladder and small bilateral bladder diverticula and a small prostate. He also had acute kidney injury which looked to be prerenal. Baseline creatinine isaround 2.8 or 3. A Foley catheter was placed. He is on tamsulosin.  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 Patient recalls gross hematuria after starting Plavix following the carotid endarterectomy. Urine was red. He had not had clots. Urine is light pink currently.  He underwent right ureteroscopy for further evaluation of the right renal pelvic mass.This turned out to be a stone  versus fungal ball. It was not a malignancy.Stone analysis appeared consistent with blood material with a few crystals too small to identify.  He underwent a UDS as described below which weak detrusor pressures but low PVR.  His Foley catheter was maintained.  However, his right hydroureteronephrosis persisted.  Due to this, he underwent a Lasix renogram with the Foley catheter in place which showed significant delayed washout of the right kidney despite Lasix.  This is per report as the software is not allowing me to review the images currently.   Previous UDS Results: Maximum capacity 381 cc First sensation 368 cc Max unstable detrusor contracture 9 cm of water First unstable contraction occurred at 381 cc Maximum flow rate was 12 cc/s Detrusor pressure max flow rate was 13 cm of water Max detrusor pressure of 14 cm of water PVR 65 cc The patient did technically passhisUDS trial of void, buthis detrusor pressures were very weak.    PMH:     Past Medical History:  Diagnosis Date  . Blood transfusion without reported diagnosis    patient unaware of receiving blood unless it was during surgery and he was not told  . Cancer [facility]) 2001   Colon resection  . Chronic kidney disease 11/2017   stage IV CKD per nephrologist.  . Complication of anesthesia    raspy voice since carotid endarterectomy 07/17/17. paralysis of vocal chords  . Coronary artery disease   . Diabetes mellitus without complication ([place])   . Dysrhythmia    patient unaware of any irregular heart rhythms  . Hyperlipidemia   . Hypertension   . Hypothyroidism   . Kidney  mass 2019  . Myocardial infarction ([place]) 1997  . Peripheral vascular disease ([place])   . Pneumonia 2014, 2018   developed after surgery 2018  . Stroke ([place]) 07/2017   mild stroke and then had carotid endarterectomy  . Thyroid disease   . Weight loss 2019   patient has lost over 60 pounds since 07/2017.     Surgical  History:      Past Surgical History:  Procedure Laterality Date  . APPENDECTOMY  2011  . BACK SURGERY    . CARDIAC CATHETERIZATION  1997  . CAROTID ANGIOGRAPHY Right 06/13/2017   Procedure: Right subclavian and Carotid Angiography, possible intervention;  Surgeon: Algernon Huxley, MD;  Location: [facility];  Service: Cardiovascular;  Laterality: Right;  . CHOLECYSTECTOMY N/A 04/23/2016   had infection post surgery requiring him to debride daily  . 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 CORONARY ARTERY BYPASS GRAFT  1997   x 3  . CYSTOSCOPY W/ RETROGRADES Bilateral 09/03/2017   Procedure: CYSTOSCOPY WITH RETROGRADE PYELOGRAM;  Surgeon: Hollice Espy, MD;  Location: [facility];  Service: Urology;  Laterality: Bilateral;  . CYSTOSCOPY W/ URETERAL STENT REMOVAL  08/2017  . CYSTOSCOPY WITH STENT PLACEMENT Right 09/03/2017   Procedure: CYSTOSCOPY WITH STENT PLACEMENT;  Surgeon: Hollice Espy, MD;  Location: [facility];  Service: Urology;  Laterality: Right;  . ENDARTERECTOMY Right 07/17/2017   Procedure: ENDARTERECTOMY CAROTID;  Surgeon: Algernon Huxley, MD;  Location: [facility];  Service: Vascular;  Laterality: Right;  . HERNIA REPAIR  2011   Ventral hernia  . HOLMIUM LASER APPLICATION N/A 16/07/9603   Procedure: HOLMIUM LASER APPLICATION;  Surgeon: Hollice Espy, MD;  Location: [facility];  Service: Urology;  Laterality: N/A;  . KNEE SURGERY Left    arthroscopy  . KYPHOPLASTY N/A 08/14/2017   Procedure: [id number];  Surgeon: Hessie Knows, MD;  Location: [facility];  Service: Orthopedics;  Laterality: N/A;  . PROSTATE SURGERY  2002   BPH benign pathology  . SPINE SURGERY  1989   Lumbar disc    Home Medications:      Allergies as of 12/26/2017      Reactions   Ace Inhibitors Other (See Comments)   Reaction:  Raises potassium    Quinapril Rash, Other (See Comments)   hyperkalemia                    Medication List             Accurate as of 12/26/17 10:59 AM. Always use your most recent med list.           acetaminophen 500 MG tablet Commonly known as:  TYLENOL Take 1,000 mg every 6 (six) hours as needed by mouth for moderate pain or headache.   aspirin 81 MG EC tablet Take 1 tablet (81 mg total) by mouth daily.   atenolol 50 MG tablet Commonly known as:  TENORMIN Take 1 tablet (50 mg total) by mouth daily.   atorvastatin 40 MG tablet Commonly known as:  LIPITOR Take 1 tablet (40 mg total) daily at 6 PM by mouth.   clopidogrel 75 MG tablet Commonly known as:  PLAVIX Take 75 mg by mouth daily. Stop one week prior to vocal cord surgery   insulin NPH Human 100 UNIT/ML injection Commonly known as:  HUMULIN N,NOVOLIN N Inject 0.25 mLs (25 Units total) into the skin 2 (two) times daily.   insulin  regular 100 units/mL injection Commonly known as:  NOVOLIN R,HUMULIN R Inject 10-15 Units into the skin 2 (two) times daily. 15 units in the morning & 10 units in the evening with supper   levothyroxine 300 MCG tablet Commonly known as:  SYNTHROID, LEVOTHROID Take 300 mcg by mouth daily before breakfast.   sodium bicarbonate 650 MG tablet Take 650 mg 2 (two) times daily by mouth.   tamsulosin 0.4 MG Caps capsule Commonly known as:  FLOMAX Take 1 capsule (0.4 mg total) by mouth daily.       Allergies:       Allergies  Allergen Reactions  . Ace Inhibitors Other (See Comments)    Reaction:  Raises potassium   . Quinapril Rash and Other (See Comments)    hyperkalemia    Family History:      Family History  Problem Relation Age of Onset  . Cancer Mother        Lung  . Diabetes Mother   . Heart disease Mother   . Heart disease Father     Social History:  reports that he quit smoking about 21 years ago. His smoking use included cigarettes. He quit after 1.00 year of use. he has never used smokeless tobacco. He reports that he  does not drink alcohol or use drugs.  ROS: UROLOGY Frequent Urination?: No Hard to postpone urination?: No Burning/pain with urination?: No Get up at night to urinate?: No Leakage of urine?: No Urine stream starts and stops?: No Trouble starting stream?: No Do you have to strain to urinate?: No Blood in urine?: No Urinary tract infection?: No Sexually transmitted disease?: No Injury to kidneys or bladder?: No Painful intercourse?: No Weak stream?: No Erection problems?: No Penile pain?: No  Gastrointestinal Nausea?: No Vomiting?: No Indigestion/heartburn?: No Diarrhea?: No Constipation?: No  Constitutional Fever: No Night sweats?: No Weight loss?: No Fatigue?: No  Skin Skin rash/lesions?: No Itching?: No  Eyes Blurred vision?: No Double vision?: No  Ears/Nose/Throat Sore throat?: No Sinus problems?: No  Hematologic/Lymphatic Swollen glands?: No Easy bruising?: No  Cardiovascular Leg swelling?: No Chest pain?: No  Respiratory Cough?: No Shortness of breath?: No  Endocrine Excessive thirst?: No  Musculoskeletal Back pain?: No Joint pain?: No  Neurological Headaches?: No Dizziness?: No  Psychologic Depression?: No Anxiety?: No  Physical Exam: BP 114/65   Pulse (!) 48   Resp 16   Ht 6\' 2"  (1.88 m)   Wt 220 lb 11.2 oz (100.1 kg)   SpO2 98%   BMI 28.34 kg/m   Constitutional:  Alert and oriented, No acute distress. HEENT: [place] AT, moist mucus membranes.  Trachea midline, no masses. Cardiovascular: No clubbing, cyanosis, or edema. Respiratory: Normal respiratory effort, no increased work of breathing. GI: Abdomen is soft, nontender, nondistended, no abdominal masses GU: No CVA tenderness.  Skin: No rashes, bruises or suspicious lesions. Lymph: No cervical or inguinal adenopathy. Neurologic: Grossly intact, no focal deficits, moving all 4 extremities. Psychiatric: Normal mood and affect.  Laboratory Data: RecentLabs        Lab Results  Component Value Date   WBC 7.0 12/23/2017   HGB 11.7 (L) 12/23/2017   HCT 35.4 (L) 12/23/2017   MCV 94.5 12/23/2017   PLT 118 (L) 12/23/2017      RecentLabs       Lab Results  Component Value Date   CREATININE 3.08 (H) 12/23/2017      RecentLabs       Lab Results  Component Value  Date   PSA <0.1 05/16/2014      RecentLabs  No results found for: TESTOSTERONE    RecentLabs       Lab Results  Component Value Date   HGBA1C 7.1 (H) 08/11/2017      Urinalysis Labs(Brief)           Component Value Date/Time   COLORURINE RED (A) 11/13/2017 2022   APPEARANCEUR TURBID (A) 11/13/2017 2022   LABSPEC 1.015 11/13/2017 2022   PHURINE  11/13/2017 2022    TEST NOT REPORTED DUE TO COLOR INTERFERENCE OF URINE PIGMENT   GLUCOSEU (A) 11/13/2017 2022    TEST NOT REPORTED DUE TO COLOR INTERFERENCE OF URINE PIGMENT   HGBUR (A) 11/13/2017 2022    TEST NOT REPORTED DUE TO COLOR INTERFERENCE OF URINE PIGMENT   BILIRUBINUR (A) 11/13/2017 2022    TEST NOT REPORTED DUE TO COLOR INTERFERENCE OF URINE PIGMENT   KETONESUR (A) 11/13/2017 2022    TEST NOT REPORTED DUE TO COLOR INTERFERENCE OF URINE PIGMENT   PROTEINUR (A) 11/13/2017 2022    TEST NOT REPORTED DUE TO COLOR INTERFERENCE OF URINE PIGMENT   NITRITE (A) 11/13/2017 2022    TEST NOT REPORTED DUE TO COLOR INTERFERENCE OF URINE PIGMENT   LEUKOCYTESUR (A) 11/13/2017 2022    TEST NOT REPORTED DUE TO COLOR INTERFERENCE OF URINE PIGMENT      Assessment & Plan:    1.  Right obstructive hydroureteronephrosis I discussed the patient that his Lasix renogram showed persistent right hydroureteronephrosis despite maximal bladder drainage.  We discussed investigating this.  Potential causes include iatrogenic injury from previous instrumentation.  He has been through this procedure before.  We will have him scheduled for cystoscopy, right retrograde pyelogram, right  ureteroscopy, and right ureteral stent placement to further evaluate why his right collecting system is not appropriately draining.  2.  History of urinary retention The patient was voiding with small PVRs prior to Foley placement for above.  We will remove his Foley catheter at this time.  For the next couple days, he will check a postvoid residual with a catheter as he is very familiar CIC.  If this value is less than 200 cc, he will stop intermittent catheterization.  Nickie Retort, MD  [facility] [address] [place], [place] [address] [phone]

## 2018-01-29 ENCOUNTER — Encounter: Payer: Self-pay | Admitting: Urology

## 2018-01-29 LAB — CYTOLOGY - NON PAP

## 2018-01-29 LAB — SURGICAL PATHOLOGY

## 2018-01-29 NOTE — Anesthesia Postprocedure Evaluation (Signed)
Anesthesia Post Note  Patient: [name]  Procedure(s) Performed: CYSTOSCOPY WITH URETEROSCOPY (Right Ureter) CYSTOSCOPY WITH RETROGRADE PYELOGRAM (Right Ureter) CYSTOSCOPY WITH STENT PLACEMENT (Right Ureter) CYSTOSCOPY WITH BIOPSY (Right Ureter)  Patient location during evaluation: PACU Anesthesia Type: General Level of consciousness: awake and alert Pain management: pain level controlled Vital Signs Assessment: post-procedure vital signs reviewed and stable Respiratory status: spontaneous breathing, nonlabored ventilation, respiratory function stable and patient connected to nasal cannula oxygen Cardiovascular status: blood pressure returned to baseline and stable Postop Assessment: no apparent nausea or vomiting Anesthetic complications: no     Last Vitals:  Vitals:   01/28/18 1413 01/28/18 1501  BP: (!) 158/81 (!) 169/84  Pulse: (!) 55 (!) 59  Resp: 16 16  Temp:  (!) 36.1 C  SpO2: 94% 94%    Last Pain:  Vitals:   01/28/18 1501  TempSrc: Temporal  PainSc:                  Precious Haws Wilma Wuthrich

## 2018-01-30 ENCOUNTER — Other Ambulatory Visit: Payer: Self-pay | Admitting: *Deleted

## 2018-01-30 NOTE — Patient Outreach (Signed)
[facility]) Care Management  01/30/2018  [name] 1941-02-26 [id number]   Telephone assessment   Placed call to patient , able to speak with his wife [name] contact person HIPAA verified.  Patient spouse discussed patient recent urology procedure with stent placement, She reports patient is doing well, reports urinating without problems, urine orange colored but clearing states told that this would be expected.   Patient discussed tolerating mobility without problems.  Patient reports tolerating diet .  Patient reports his voice has improved since ENT procedure and patient is hopeful for continued progress.   Diabetes  Patient reports he is not checking his blood sugar but taking insulin and other medications, she is still off plavix.     Patient is agreeable to home visit in the next month.  Plan Will plan home visit in the next month to assess for further care management needs and possible transition to health coach for continued education support of diabetes.    James Draft, RN, [place] Management Coordinator  [phone]- Mobile [phone]- Toll Free Main Office

## 2018-02-03 DIAGNOSIS — I25118 Atherosclerotic heart disease of native coronary artery with other forms of angina pectoris: Secondary | ICD-10-CM | POA: Diagnosis not present

## 2018-02-03 DIAGNOSIS — E1129 Type 2 diabetes mellitus with other diabetic kidney complication: Secondary | ICD-10-CM | POA: Diagnosis not present

## 2018-02-03 DIAGNOSIS — I10 Essential (primary) hypertension: Secondary | ICD-10-CM | POA: Diagnosis not present

## 2018-02-03 DIAGNOSIS — I25718 Atherosclerosis of autologous vein coronary artery bypass graft(s) with other forms of angina pectoris: Secondary | ICD-10-CM | POA: Diagnosis not present

## 2018-02-03 DIAGNOSIS — R0989 Other specified symptoms and signs involving the circulatory and respiratory systems: Secondary | ICD-10-CM | POA: Diagnosis not present

## 2018-02-10 ENCOUNTER — Other Ambulatory Visit: Payer: PPO | Admitting: Urology

## 2018-02-10 ENCOUNTER — Encounter: Payer: Self-pay | Admitting: Urology

## 2018-02-10 ENCOUNTER — Telehealth: Payer: Self-pay | Admitting: Urology

## 2018-02-10 NOTE — Telephone Encounter (Signed)
Patient was a no-show today for routine follow-up to discuss management of his stent.  I would like him to be rescheduled for in about 2 weeks.  It is extremely important that he follow-up given that he has a current indwelling ureteral stent.  Please reach out to him to reschedule this visit.    Hollice Espy, MD

## 2018-02-11 NOTE — Telephone Encounter (Signed)
Appt rescheduled. Wife reports forgetting appointment.

## 2018-02-16 ENCOUNTER — Other Ambulatory Visit: Payer: Self-pay | Admitting: *Deleted

## 2018-02-16 ENCOUNTER — Other Ambulatory Visit: Payer: Self-pay

## 2018-02-16 ENCOUNTER — Ambulatory Visit: Payer: Self-pay | Admitting: *Deleted

## 2018-02-16 MED ORDER — TAMSULOSIN HCL 0.4 MG PO CAPS: 0.4000 mg | ORAL_CAPSULE | Freq: Every day | ORAL | 0 refills | Status: DC

## 2018-02-16 NOTE — Patient Outreach (Signed)
[facility]) Care Management  02/16/2018  UNO ESAU 11/19/1940 [id number]   Scheduled home visit for 11:00 today, arrived at patient home , no answer at door, Placed call to patient no answer, able to leave a HIPAA compliant message for return call.   Plan  Will await return call if no response, will plan return call to patient within the week.    [name], RN, [place] Management Coordinator  [phone]- Mobile [phone]- Toll Free Main Office

## 2018-02-20 ENCOUNTER — Encounter: Payer: Self-pay | Admitting: *Deleted

## 2018-02-20 ENCOUNTER — Other Ambulatory Visit: Payer: Self-pay | Admitting: *Deleted

## 2018-02-20 NOTE — Patient Outreach (Signed)
[facility]) Care Management  02/20/2018  [name] 05/17/1941 [id number]  Case Closure  Successful outreach call to patient, HIPAA verified.  Discussed missed home visit on 5/6, patient reports that they forgot and were out shopping.  Patient states that he is doing well at home, increased mobility, no falls, discussed being able to do some yard work.   Patient reports his voice has improved since his recent ENT procedure and denies any new concerns.   Patient discussed that he continues to take medications as prescribed.  He reports checking his blood sugar but not always daily, he continues taking insulin  .Patient discussed recent episode of low blood sugar less than 60 reports he had been working outside, after having a snack reports symptoms improved , but did not recheck reading. Patient is able to state symptoms to recognize of low blood sugar and stating treating with a snack , they keep glucose tablets available. Reinforced rule of 15 for hypoglycemia, patient states he understands.  Advised patient regarding notifying MD of increased episodes of low blood sugar. Patient discussed he has appointment with his endocrinologist in next  month, reports his last A1c was 7.5 in February.   Patient discussed recent office visit with cardiology 4/23 with Dr.Fath  and nephrologist, Dr.Kruska  on  4/11.  Patient discussed his last urology procedure having a stent in place and that he no longer has to do catheterizations.  Patient that he is urinating in good amount, no pain , color is clear. Patient discussed having follow up visit with urologist in the next week .   Discussed with patient importance of keeping all medical appointments, discussed upcoming urology, and endocrine visits. Reinforced importance of scheduling PCP office visit for wellness visit, patient agrees   Patient denies any new concerns at this time.   Patient has completed complex care management  post recent discharge. Explained and offered [facility] telephonic disease management program for chronic condition of Diabetes,HTN .Patient has declined need for continued follow up at this time. Verified patient has [facility] contact number if new concerns arise.   Plan  Will close case,  Will send PCP case closure letter.    [name], RN, [place] Management Coordinator  [phone]- Mobile [phone]- Toll Free Main Office

## 2018-02-21 ENCOUNTER — Other Ambulatory Visit: Payer: Self-pay | Admitting: Internal Medicine

## 2018-02-21 DIAGNOSIS — I2510 Atherosclerotic heart disease of native coronary artery without angina pectoris: Secondary | ICD-10-CM

## 2018-02-23 NOTE — Telephone Encounter (Signed)
Patient is scheduled to come in September for cpe/maw

## 2018-02-25 ENCOUNTER — Encounter: Payer: Self-pay | Admitting: Urology

## 2018-02-25 ENCOUNTER — Ambulatory Visit: Payer: PPO | Admitting: Urology

## 2018-02-25 VITALS — BP 116/61 | HR 62 | Ht 74.0 in | Wt 215.6 lb

## 2018-02-25 DIAGNOSIS — N135 Crossing vessel and stricture of ureter without hydronephrosis: Secondary | ICD-10-CM | POA: Diagnosis not present

## 2018-02-26 NOTE — Progress Notes (Signed)
02/25/2018 8:08 PM   [name] [birth year] [id number]  Referring provider: Glean Hess, MD [address] [place], [place] [address]  Chief Complaint  Patient presents with  . Follow-up    HPI: 77 year old male who returns today for further discussion stent management.  He is undergone several procedures for right hydronephrosis with possible obstruction.  Most recently, he returned to the operating room on 01/29/2016 for right retrograde pyelogram, right ureteroscopy, ureteral biopsy, selective cytology balloon dilation and stent placement.  He is found to have narrowing within the right proximal ureter with no obvious tumor.  There was hydronephrosis proximal to the tapering at the level of the UPJ.  There is also pulsation noted at this level concerning for possible underlying UPJ obstruction possibly related to crossing vessel..  Biopsies are negative for any dysplasia or malignancy.  He is completely asymptomatic without right flank pain or any other symptoms.  He did undergo Lasix renogram in 12/2017 with normal split function of his right kidney, 51%, delayed emptying of the left kidney at 30 minutes and essentially non-emptying of the right with impaired washout of the radiotracer within dilated collecting system.  This is felt to be consistent with obstruction.  Reexamination of old CT scans dating back to 2017 show a tapering at the same level but with slight progression of the hydronephrosis.  This appears to be around the location of the gonadal vessels.  He has been tolerating the stent without any difficulty.  He reports that he has no urinary symptoms or flank pain.  He does not even notice that the stent is in place.  PMH: Past Medical History:  Diagnosis Date  . Blood transfusion without reported diagnosis    patient unaware of receiving blood unless it was during surgery and he was not told  . Cancer [facility]) 2001   Colon resection  . Chronic  kidney disease 11/2017   stage IV CKD per nephrologist.  . Complication of anesthesia    raspy voice since carotid endarterectomy 07/17/17. paralysis of vocal chords  . Coronary artery disease   . Diabetes mellitus without complication ([place])   . Dysrhythmia    patient unaware of any irregular heart rhythms  . Hyperlipidemia   . Hypertension   . Hypothyroidism   . Kidney mass 2019  . Myocardial infarction ([place]) 1997  . Peripheral vascular disease ([place])   . Pneumonia 2014, 2018   developed after surgery 2018  . Stroke ([place]) 07/2017   mild stroke and then had carotid endarterectomy  . Thyroid disease   . Weight loss 2019   patient has lost over 60 pounds since 07/2017.     Surgical History: Past Surgical History:  Procedure Laterality Date  . APPENDECTOMY  2011  . BACK SURGERY    . CARDIAC CATHETERIZATION  1997  . CAROTID ANGIOGRAPHY Right 06/13/2017   Procedure: Right subclavian and Carotid Angiography, possible intervention;  Surgeon: Algernon Huxley, MD;  Location: [place] [facility];  Service: Cardiovascular;  Laterality: Right;  . CHOLECYSTECTOMY N/A 04/23/2016   had infection post surgery requiring him to debride daily  . 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 CORONARY ARTERY BYPASS GRAFT  1997   x 3  . CYSTOSCOPY W/ RETROGRADES Bilateral 09/03/2017   Procedure: CYSTOSCOPY WITH RETROGRADE PYELOGRAM;  Surgeon: Hollice Espy, MD;  Location: [facility];  Service: Urology;  Laterality: Bilateral;  . CYSTOSCOPY W/ RETROGRADES Right  01/28/2018   Procedure: CYSTOSCOPY WITH RETROGRADE PYELOGRAM;  Surgeon: Hollice Espy, MD;  Location: [facility];  Service: Urology;  Laterality: Right;  . CYSTOSCOPY W/ URETERAL STENT REMOVAL  08/2017  . CYSTOSCOPY WITH BIOPSY Right 01/28/2018   Procedure: CYSTOSCOPY WITH BIOPSY;  Surgeon: Hollice Espy, MD;  Location: [facility];  Service: Urology;  Laterality: Right;  . CYSTOSCOPY WITH STENT PLACEMENT Right  09/03/2017   Procedure: CYSTOSCOPY WITH STENT PLACEMENT;  Surgeon: Hollice Espy, MD;  Location: [facility];  Service: Urology;  Laterality: Right;  . CYSTOSCOPY WITH STENT PLACEMENT Right 01/28/2018   Procedure: CYSTOSCOPY WITH STENT PLACEMENT;  Surgeon: Hollice Espy, MD;  Location: [facility];  Service: Urology;  Laterality: Right;  . CYSTOSCOPY WITH URETEROSCOPY Right 01/28/2018   Procedure: CYSTOSCOPY WITH URETEROSCOPY;  Surgeon: Hollice Espy, MD;  Location: [facility];  Service: Urology;  Laterality: Right;  . ENDARTERECTOMY Right 07/17/2017   Procedure: ENDARTERECTOMY CAROTID;  Surgeon: Algernon Huxley, MD;  Location: [facility];  Service: Vascular;  Laterality: Right;  . HERNIA REPAIR  2011   Ventral hernia  . HOLMIUM LASER APPLICATION N/A 24/23/5361   Procedure: HOLMIUM LASER APPLICATION;  Surgeon: Hollice Espy, MD;  Location: [facility];  Service: Urology;  Laterality: N/A;  . KNEE SURGERY Left    arthroscopy  . KYPHOPLASTY N/A 08/14/2017   Procedure: [id number];  Surgeon: Hessie Knows, MD;  Location: [facility];  Service: Orthopedics;  Laterality: N/A;  . LARYNX SURGERY    . PROSTATE SURGERY  2002   BPH benign pathology  . SPINE SURGERY  1989   Lumbar disc  . THYROPLASTY Right 01/05/2018   Procedure: THYROPLASTY;  Surgeon: Beverly Gust, MD;  Location: [facility];  Service: ENT;  Laterality: Right;    Home Medications:  Allergies as of 02/25/2018      Reactions   Ace Inhibitors Other (See Comments)   Reaction:  Raises potassium    Quinapril Rash, Other (See Comments)   hyperkalemia      Medication List        Accurate as of 02/25/18 11:59 PM. Always use your most recent med list.          acetaminophen 500 MG tablet Commonly known as:  TYLENOL Take 1,000 mg every 6 (six) hours as needed by mouth for moderate pain or headache.   aspirin 81 MG EC tablet Take 1 tablet (81 mg total) by mouth daily.   atenolol 50 MG tablet Commonly known as:  TENORMIN TAKE 1 TABLET  BY MOUTH ONCE DAILY   atorvastatin 40 MG tablet Commonly known as:  LIPITOR Take 1 tablet (40 mg total) daily at 6 PM by mouth.   docusate sodium 100 MG capsule Commonly known as:  COLACE Take 1 capsule (100 mg total) by mouth 2 (two) times daily.   HYDROcodone-acetaminophen 5-325 MG tablet Commonly known as:  NORCO/VICODIN Take 1-2 tablets by mouth every 6 (six) hours as needed for moderate pain.   insulin NPH Human 100 UNIT/ML injection Commonly known as:  HUMULIN N,NOVOLIN N Inject 0.25 mLs (25 Units total) into the skin 2 (two) times daily.   insulin regular 100 units/mL injection Commonly known as:  NOVOLIN R,HUMULIN R Inject 10-15 Units into the skin 2 (two) times daily. 15 units in the morning & 10 units in the evening with supper   levothyroxine 300 MCG tablet Commonly known as:  SYNTHROID, LEVOTHROID Take 300 mcg by mouth daily before breakfast.   oxybutynin 5 MG tablet Commonly known as:  DITROPAN Take 1 tablet (5 mg total) by mouth every 8 (eight) hours as needed for bladder spasms.   sodium bicarbonate 650 MG tablet Take 650 mg 2 (two) times daily by mouth.   tamsulosin 0.4 MG Caps capsule Commonly known as:  FLOMAX Take 1 capsule (0.4 mg total) by mouth daily.       Allergies:  Allergies  Allergen Reactions  . Ace Inhibitors Other (See Comments)    Reaction:  Raises potassium   . Quinapril Rash and Other (See Comments)    hyperkalemia    Family History: Family History  Problem Relation Age of Onset  . Cancer Mother        Lung  . Diabetes Mother   . Heart disease Mother   . Heart disease Father     Social History:  reports that he quit smoking about 21 years ago. His smoking use included cigarettes. He quit after 1.00 year of use. He has never used smokeless tobacco. He reports that he does not drink alcohol or use drugs.  ROS: UROLOGY Frequent Urination?: No Hard to postpone urination?: No Burning/pain with urination?: No Get up at night  to urinate?: No Leakage of urine?: No Urine stream starts and stops?: No Trouble starting stream?: No Do you have to strain to urinate?: No Blood in urine?: No Urinary tract infection?: No Sexually transmitted disease?: No Injury to kidneys or bladder?: No Painful intercourse?: No Weak stream?: No Erection problems?: No Penile pain?: No  Gastrointestinal Nausea?: No Vomiting?: No Indigestion/heartburn?: No Diarrhea?: No Constipation?: No  Constitutional Fever: No Night sweats?: No Weight loss?: No Fatigue?: No  Skin Skin rash/lesions?: No Itching?: No  Eyes Blurred vision?: No Double vision?: No  Ears/Nose/Throat Sore throat?: No Sinus problems?: No  Hematologic/Lymphatic Swollen glands?: No Easy bruising?: No  Cardiovascular Leg swelling?: No Chest pain?: No  Respiratory Cough?: No Shortness of breath?: No  Endocrine Excessive thirst?: No  Musculoskeletal Back pain?: No Joint pain?: No  Neurological Headaches?: No Dizziness?: No  Psychologic Depression?: No Anxiety?: No  Physical Exam: BP 116/61 (BP Location: Left Arm, Patient Position: Sitting, Cuff Size: Large)   Pulse 62   Ht 6\' 2"  (1.88 m)   Wt 215 lb 9.6 oz (97.8 kg)   BMI 27.68 kg/m   Constitutional:  Alert and oriented, No acute distress.  Accompanied by wife today. HEENT: [place] AT, moist mucus membranes.  Trachea midline, no masses.  Cardiovascular: No clubbing, cyanosis, or edema. Respiratory: Normal respiratory effort, no increased work of breathing. Skin: No rashes, bruises or suspicious lesions. Neurologic: Grossly intact, no focal deficits, moving all 4 extremities. Psychiatric: Normal mood and affect.  Laboratory Data: Lab Results  Component Value Date   WBC 7.0 12/23/2017   HGB 11.6 (L) 01/28/2018   HCT 34.0 (L) 01/28/2018   MCV 94.5 12/23/2017   PLT 118 (L) 12/23/2017   . Lab Results  Component Value Date   HGBA1C 7.1 (H) 08/11/2017   Component     Latest  Ref Rng & Units 12/23/2017         3:08 PM  Sodium     135 - 145 mmol/L 137  Potassium     3.5 - 5.1 mmol/L 4.9  Chloride     101 - 111 mmol/L 112 (H)  CO2     22 - 32 mmol/L 19 (L)  Glucose     65 - 99 mg/dL 152 (H)  BUN     6 - 20 mg/dL 41 (  H)  Creatinine     0.61 - 1.24 mg/dL 3.08 (H)  Calcium     8.9 - 10.3 mg/dL 7.8 (L)  GFR, Est Non African American     >60 mL/min 18 (L)  GFR, Est African American     >60 mL/min 21 (L)  Anion gap     5 - 15 6   Creatinine stable, baseline creatinine ranges from 2.5-3 since at least 2015.  Urinalysis N/a  Pertinent imaging: Extensive review today of multiple scans dating back to 2017 including all cross-sectional CT scans, appears to be most consistent with UPJ, possibly related to crossing gonadal vessel with dilation of the ureter, renal pelvis proximal to this  Lasix renogram from 12/2017 also reviewed personally  Assessment & Plan:    1. 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 with tapering near the area of a crossing vessel Otherwise asymptomatic Renal function is essentially remained stable for the past 5 years He is asymptomatic without episodes of flank pain We discussed options for further management He did undergo balloon dilation which will likely not be effective We discussed management with chronic indwelling ureteral stent versus conservative management with surveillance/observation versus more extensive surgical reconstruction in the form of pyeloplasty He is not interested in any major procedures and given his age, comorbidities, and asymptomatic state with a normal split function, and also hesitant to offer this to him After lengthy discussion about the risks and benefits, he would like to have the stent removed in a few weeks and manage conservatively with serial imaging, renograms, and monitoring of his overall renal function He  understands if this fails, he will need replacement of his stent to optimize drainage All questions were answered today   Return in about 1 month (around 03/25/2018), or cystoscopy/ stent removal.  Hollice Espy, MD  [facility] [address], [place], [place] [address] [phone]  I spent 25 min with this patient of which greater than 50% was spent in counseling and coordination of care with the patient.

## 2018-03-16 DIAGNOSIS — J3801 Paralysis of vocal cords and larynx, unilateral: Secondary | ICD-10-CM | POA: Diagnosis not present

## 2018-03-21 ENCOUNTER — Other Ambulatory Visit: Payer: Self-pay | Admitting: Urology

## 2018-03-27 ENCOUNTER — Ambulatory Visit (INDEPENDENT_AMBULATORY_CARE_PROVIDER_SITE_OTHER): Payer: PPO | Admitting: Vascular Surgery

## 2018-03-27 ENCOUNTER — Encounter (INDEPENDENT_AMBULATORY_CARE_PROVIDER_SITE_OTHER): Payer: Self-pay | Admitting: Vascular Surgery

## 2018-03-27 ENCOUNTER — Ambulatory Visit (INDEPENDENT_AMBULATORY_CARE_PROVIDER_SITE_OTHER): Payer: PPO

## 2018-03-27 VITALS — BP 133/69 | HR 62 | Resp 14 | Ht 74.0 in | Wt 215.0 lb

## 2018-03-27 DIAGNOSIS — I63239 Cerebral infarction due to unspecified occlusion or stenosis of unspecified carotid arteries: Secondary | ICD-10-CM | POA: Diagnosis not present

## 2018-03-27 DIAGNOSIS — I129 Hypertensive chronic kidney disease with stage 1 through stage 4 chronic kidney disease, or unspecified chronic kidney disease: Secondary | ICD-10-CM | POA: Diagnosis not present

## 2018-03-27 DIAGNOSIS — N184 Chronic kidney disease, stage 4 (severe): Secondary | ICD-10-CM | POA: Diagnosis not present

## 2018-03-27 DIAGNOSIS — I10 Essential (primary) hypertension: Secondary | ICD-10-CM | POA: Diagnosis not present

## 2018-03-27 DIAGNOSIS — E1142 Type 2 diabetes mellitus with diabetic polyneuropathy: Secondary | ICD-10-CM | POA: Diagnosis not present

## 2018-03-27 DIAGNOSIS — E1122 Type 2 diabetes mellitus with diabetic chronic kidney disease: Secondary | ICD-10-CM

## 2018-03-27 NOTE — Assessment & Plan Note (Signed)
blood glucose control important in reducing the progression of atherosclerotic disease. Also, involved in wound healing. On appropriate medications.  

## 2018-03-27 NOTE — Assessment & Plan Note (Signed)
His carotid duplex today shows a widely patent right carotid endarterectomy and a known left carotid artery occlusion. Doing well now.  Continue current medical regimen including aspirin and Lipitor.  Recheck in 1 year

## 2018-03-27 NOTE — Assessment & Plan Note (Signed)
blood pressure control important in reducing the progression of atherosclerotic disease. On appropriate oral medications.  

## 2018-03-27 NOTE — Progress Notes (Signed)
MRN : [id number]  [name] is a 77 y.o. (20-Sep-1941) male who presents with chief complaint of  Chief Complaint  Patient presents with  . Carotid    6 month Follow up  .  History of Present Illness: Patient returns in follow-up of his carotid disease.  He is doing well from his carotid standpoint.  He is still struggling somewhat with his urologic issues.  His hoarseness is much better after procedure by ENT.  His carotid duplex today shows a widely patent right carotid endarterectomy and a known left carotid artery occlusion.  Current Outpatient Medications  Medication Sig Dispense Refill  . acetaminophen (TYLENOL) 500 MG tablet Take 1,000 mg every 6 (six) hours as needed by mouth for moderate pain or headache.    Marland Kitchen aspirin EC 81 MG EC tablet Take 1 tablet (81 mg total) by mouth daily. 90 tablet 3  . atenolol (TENORMIN) 50 MG tablet TAKE 1 TABLET BY MOUTH ONCE DAILY 30 tablet 5  . atorvastatin (LIPITOR) 40 MG tablet Take 1 tablet (40 mg total) daily at 6 PM by mouth. 30 tablet 5  . docusate sodium (COLACE) 100 MG capsule Take 1 capsule (100 mg total) by mouth 2 (two) times daily. 60 capsule 0  . HYDROcodone-acetaminophen (NORCO/VICODIN) 5-325 MG tablet Take 1-2 tablets by mouth every 6 (six) hours as needed for moderate pain. 10 tablet 0  . insulin NPH Human (HUMULIN N,NOVOLIN N) 100 UNIT/ML injection Inject 0.25 mLs (25 Units total) into the skin 2 (two) times daily. (Patient taking differently: Inject 30 Units into the skin 2 (two) times daily. ) 10 mL 11  . insulin regular (NOVOLIN R,HUMULIN R) 100 units/mL injection Inject 10-15 Units into the skin 2 (two) times daily. 15 units in the morning & 10 units in the evening with supper    . levothyroxine (SYNTHROID, LEVOTHROID) 300 MCG tablet Take 300 mcg by mouth daily before breakfast.    . oxybutynin (DITROPAN) 5 MG tablet Take 1 tablet (5 mg total) by mouth every 8 (eight) hours as needed for bladder spasms. 30 tablet 0  . sodium  bicarbonate 650 MG tablet Take 650 mg 2 (two) times daily by mouth.     . tamsulosin (FLOMAX) 0.4 MG CAPS capsule TAKE 1 CAPSULE BY MOUTH ONCE DAILY 30 capsule 0   No current facility-administered medications for this visit.     Past Medical History:  Diagnosis Date  . Blood transfusion without reported diagnosis    patient unaware of receiving blood unless it was during surgery and he was not told  . Cancer [facility]) 2001   Colon resection  . Chronic kidney disease 11/2017   stage IV CKD per nephrologist.  . Complication of anesthesia    raspy voice since carotid endarterectomy 07/17/17. paralysis of vocal chords  . Coronary artery disease   . Diabetes mellitus without complication ([place])   . Dysrhythmia    patient unaware of any irregular heart rhythms  . Hyperlipidemia   . Hypertension   . Hypothyroidism   . Kidney mass 2019  . Myocardial infarction ([place]) 1997  . Peripheral vascular disease ([place])   . Pneumonia 2014, 2018   developed after surgery 2018  . Stroke ([place]) 07/2017   mild stroke and then had carotid endarterectomy  . Thyroid disease   . Weight loss 2019   patient has lost over 60 pounds since 07/2017.     Past Surgical History:  Procedure Laterality Date  . APPENDECTOMY  2011  . BACK SURGERY    . CARDIAC CATHETERIZATION  1997  . CAROTID ANGIOGRAPHY Right 06/13/2017   Procedure: Right subclavian and Carotid Angiography, possible intervention;  Surgeon: Algernon Huxley, MD;  Location: [place] [facility];  Service: Cardiovascular;  Laterality: Right;  . CHOLECYSTECTOMY N/A 04/23/2016   had infection post surgery requiring him to debride daily  . 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 CORONARY ARTERY BYPASS GRAFT  1997   x 3  . CYSTOSCOPY W/ RETROGRADES Bilateral 09/03/2017   Procedure: CYSTOSCOPY WITH RETROGRADE PYELOGRAM;  Surgeon: Hollice Espy, MD;  Location: [facility];  Service: Urology;  Laterality: Bilateral;  .  CYSTOSCOPY W/ RETROGRADES Right 01/28/2018   Procedure: CYSTOSCOPY WITH RETROGRADE PYELOGRAM;  Surgeon: Hollice Espy, MD;  Location: [facility];  Service: Urology;  Laterality: Right;  . CYSTOSCOPY W/ URETERAL STENT REMOVAL  08/2017  . CYSTOSCOPY WITH BIOPSY Right 01/28/2018   Procedure: CYSTOSCOPY WITH BIOPSY;  Surgeon: Hollice Espy, MD;  Location: [facility];  Service: Urology;  Laterality: Right;  . CYSTOSCOPY WITH STENT PLACEMENT Right 09/03/2017   Procedure: CYSTOSCOPY WITH STENT PLACEMENT;  Surgeon: Hollice Espy, MD;  Location: [facility];  Service: Urology;  Laterality: Right;  . CYSTOSCOPY WITH STENT PLACEMENT Right 01/28/2018   Procedure: CYSTOSCOPY WITH STENT PLACEMENT;  Surgeon: Hollice Espy, MD;  Location: [facility];  Service: Urology;  Laterality: Right;  . CYSTOSCOPY WITH URETEROSCOPY Right 01/28/2018   Procedure: CYSTOSCOPY WITH URETEROSCOPY;  Surgeon: Hollice Espy, MD;  Location: [facility];  Service: Urology;  Laterality: Right;  . ENDARTERECTOMY Right 07/17/2017   Procedure: ENDARTERECTOMY CAROTID;  Surgeon: Algernon Huxley, MD;  Location: [facility];  Service: Vascular;  Laterality: Right;  . HERNIA REPAIR  2011   Ventral hernia  . HOLMIUM LASER APPLICATION N/A 56/81/2751   Procedure: HOLMIUM LASER APPLICATION;  Surgeon: Hollice Espy, MD;  Location: [facility];  Service: Urology;  Laterality: N/A;  . KNEE SURGERY Left    arthroscopy  . KYPHOPLASTY N/A 08/14/2017   Procedure: [id number];  Surgeon: Hessie Knows, MD;  Location: [facility];  Service: Orthopedics;  Laterality: N/A;  . LARYNX SURGERY    . PROSTATE SURGERY  2002   BPH benign pathology  . SPINE SURGERY  1989   Lumbar disc  . THYROPLASTY Right 01/05/2018   Procedure: THYROPLASTY;  Surgeon: Beverly Gust, MD;  Location: [facility];  Service: ENT;  Laterality: Right;    Social History Social History   Tobacco Use  . Smoking status: Former Smoker    Years: 1.00    Types: Cigarettes    Last attempt to quit:  06/24/1996    Years since quitting: 21.7  . Smokeless tobacco: Never Used  Substance Use Topics  . Alcohol use: No    Alcohol/week: 0.0 oz    Comment: rare  . Drug use: No     Family History Family History  Problem Relation Age of Onset  . Cancer Mother        Lung  . Diabetes Mother   . Heart disease Mother   . Heart disease Father      Allergies  Allergen Reactions  . Ace Inhibitors Other (See Comments)    Reaction:  Raises potassium   . Quinapril Rash and Other (See Comments)    hyperkalemia     REVIEW OF SYSTEMS (Negative unless checked)  Constitutional: [] Weight loss  [] Fever  [] Chills Cardiac: [] Chest pain   [] Chest pressure   []   Palpitations   [] Shortness of breath when laying flat   [] Shortness of breath at rest   [] Shortness of breath with exertion. Vascular:  [] Pain in legs with walking   [] Pain in legs at rest   [] Pain in legs when laying flat   [] Claudication   [] Pain in feet when walking  [] Pain in feet at rest  [] Pain in feet when laying flat   [] History of DVT   [] Phlebitis   [] Swelling in legs   [] Varicose veins   [] Non-healing ulcers Pulmonary:   [] Uses home oxygen   [] Productive cough   [] Hemoptysis   [] Wheeze  [] COPD   [] Asthma Neurologic:  [] Dizziness  [] Blackouts   [] Seizures   [x] History of stroke   [] History of TIA  [] Aphasia   [] Temporary blindness   [x] Dysphagia   [] Weakness or numbness in arms   [] Weakness or numbness in legs Musculoskeletal:  [x] Arthritis   [] Joint swelling   [x] Joint pain   [] Low back pain Hematologic:  [] Easy bruising  [] Easy bleeding   [] Hypercoagulable state   [] Anemic  [] Hepatitis Gastrointestinal:  [] Blood in stool   [] Vomiting blood  [] Gastroesophageal reflux/heartburn   [] Difficulty swallowing. Genitourinary:  [x] Chronic kidney disease   [x] Difficult urination  [] Frequent urination  [] Burning with urination   [] Blood in urine Skin:  [] Rashes   [] Ulcers   [] Wounds Psychological:  [] History of anxiety   []  History of major  depression.  Physical Examination  Vitals:   03/27/18 1014 03/27/18 1015  BP: 138/76 133/69  Pulse: 65 62  Resp: 14   Weight: 215 lb (97.5 kg)   Height: 6\' 2"  (1.88 m)    Body mass index is 27.6 kg/m. Gen:  WD/WN, NAD Head: [place]/AT, No temporalis wasting. Ear/Nose/Throat: Hearing grossly intact, nares w/o erythema or drainage, trachea midline Eyes: Conjunctiva clear. Sclera non-icteric Neck: Supple.  Left carotid bruit  Pulmonary:  Good air movement, equal and clear to auscultation bilaterally.  Cardiac: RRR, No JVD Vascular:  Vessel Right Left  Radial Palpable Palpable                                    Musculoskeletal: M/S 5/5 throughout.  No deformity or atrophy. Mild LE edema. Neurologic: CN 2-12 intact. Sensation grossly intact in extremities.  Symmetrical.  Speech is fluent with less hoarseness. Motor exam as listed above. Psychiatric: Judgment intact, Mood & affect appropriate for pt's clinical situation. Dermatologic: No rashes or ulcers noted.  No cellulitis or open wounds.      CBC Lab Results  Component Value Date   WBC 7.0 12/23/2017   HGB 11.6 (L) 01/28/2018   HCT 34.0 (L) 01/28/2018   MCV 94.5 12/23/2017   PLT 118 (L) 12/23/2017    BMET    Component Value Date/Time   NA 140 01/28/2018 1012   NA 132 (L) 12/24/2016 1541   NA 140 12/10/2012 0726   K 4.6 01/28/2018 1012   K 4.6 12/10/2012 0726   CL 112 (H) 12/23/2017 1508   CL 111 (H) 12/10/2012 0726   CO2 19 (L) 12/23/2017 1508   CO2 20 (L) 12/10/2012 0726   GLUCOSE 111 (H) 01/28/2018 1012   GLUCOSE 181 (H) 12/10/2012 0726   BUN 41 (H) 12/23/2017 1508   BUN 50 (H) 12/24/2016 1541   BUN 33 (H) 09/01/2013 0951   CREATININE 3.08 (H) 12/23/2017 1508   CREATININE 2.27 (H) 09/01/2013 0951   CALCIUM 7.8 (L)  12/23/2017 1508   CALCIUM 8.1 (L) 12/10/2012 0726   GFRNONAA 18 (L) 12/23/2017 1508   GFRNONAA 28 (L) 09/01/2013 0951   GFRAA 21 (L) 12/23/2017 1508   GFRAA 32 (L) 09/01/2013 0951    CrCl cannot be calculated (Patient's most recent lab result is older than the maximum 21 days allowed.).  COAG Lab Results  Component Value Date   INR 1.07 11/13/2017   INR 1.13 07/10/2017   INR 1.07 06/11/2017    Radiology No results found.    Assessment/Plan Hypertension blood pressure control important in reducing the progression of atherosclerotic disease. On appropriate oral medications.   Diabetes mellitus with polyneuropathy blood glucose control important in reducing the progression of atherosclerotic disease. Also, involved in wound healing. On appropriate medications.   Carotid stenosis, symptomatic, with infarction [facility]) His carotid duplex today shows a widely patent right carotid endarterectomy and a known left carotid artery occlusion. Doing well now.  Continue current medical regimen including aspirin and Lipitor.  Recheck in 1 year    Leotis Pain, MD  03/27/2018 10:46 AM    This note was created with Dragon medical transcription system.  Any errors from dictation are purely unintentional

## 2018-03-30 DIAGNOSIS — E1159 Type 2 diabetes mellitus with other circulatory complications: Secondary | ICD-10-CM | POA: Diagnosis not present

## 2018-03-30 DIAGNOSIS — E785 Hyperlipidemia, unspecified: Secondary | ICD-10-CM | POA: Diagnosis not present

## 2018-03-30 DIAGNOSIS — I10 Essential (primary) hypertension: Secondary | ICD-10-CM | POA: Diagnosis not present

## 2018-03-30 DIAGNOSIS — E1142 Type 2 diabetes mellitus with diabetic polyneuropathy: Secondary | ICD-10-CM | POA: Diagnosis not present

## 2018-03-30 DIAGNOSIS — E1169 Type 2 diabetes mellitus with other specified complication: Secondary | ICD-10-CM | POA: Diagnosis not present

## 2018-03-31 ENCOUNTER — Encounter: Payer: Self-pay | Admitting: Urology

## 2018-03-31 ENCOUNTER — Ambulatory Visit: Payer: PPO | Admitting: Urology

## 2018-03-31 DIAGNOSIS — R310 Gross hematuria: Secondary | ICD-10-CM | POA: Diagnosis not present

## 2018-03-31 DIAGNOSIS — N135 Crossing vessel and stricture of ureter without hydronephrosis: Secondary | ICD-10-CM

## 2018-03-31 LAB — URINALYSIS, COMPLETE
Bilirubin, UA: NEGATIVE
KETONES UA: NEGATIVE
LEUKOCYTES UA: NEGATIVE
Nitrite, UA: NEGATIVE
SPEC GRAV UA: 1.025 (ref 1.005–1.030)
Urobilinogen, Ur: 0.2 mg/dL (ref 0.2–1.0)
pH, UA: 5.5 (ref 5.0–7.5)

## 2018-03-31 LAB — MICROSCOPIC EXAMINATION: WBC, UA: NONE SEEN /hpf (ref 0–5)

## 2018-03-31 MED ORDER — LIDOCAINE HCL URETHRAL/MUCOSAL 2 % EX GEL
1.0000 "application " | Freq: Once | CUTANEOUS | Status: AC
  Administered 2018-03-31: 1 via URETHRAL

## 2018-03-31 MED ORDER — CIPROFLOXACIN HCL 500 MG PO TABS
500.0000 mg | ORAL_TABLET | Freq: Once | ORAL | Status: AC
  Administered 2018-03-31: 500 mg via ORAL

## 2018-03-31 NOTE — Progress Notes (Addendum)
   03/31/18  CC:  Chief Complaint  Patient presents with  . 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  He returns today for cystoscopy, stent removal following right ureteroscopy with balloon dilation on 03/30/2017.  Please see previous note for details.  He has been tolerating the stent well.  He has no urinary complaints today.  UA is unconcerning for infection today.   NED. A&Ox3.   No respiratory distress   Abd soft, NT, ND Normal phallus with bilateral descended testicles  Cystoscopy/ Stent removal procedure  Patient identification was confirmed, informed consent was obtained, and patient was prepped using Betadine solution.  Lidocaine jelly was administered per urethral meatus.    Preoperative abx where received prior to procedure.    Procedure: - Flexible cystoscope introduced, without any difficulty.   - Thorough search of the bladder revealed:    normal urethral meatus  Stent seen emanating from right ureteral orifice, grasped with stent graspers, and removed in entirety.     Post-Procedure: - Patient tolerated the procedure well  Assessment/ Plan:  1. Obstruction of right ureteropelvic junction (UPJ) Status post uncomplicated right ureteral stent removal today We will follow-up in 4 weeks with renal ultrasound to assess degree of hydronephrosis We will likely repeat Lasix renogram down the road to assess for decline in renal function and need for further intervention Prefers to follow conservatively at this time which is reasonable Voiding symptoms following stent removal was discussed - Urinalysis, Complete - ciprofloxacin (CIPRO) tablet 500 mg - lidocaine (XYLOCAINE) 2 % jelly 1 application - US RENAL; Future - Microscopic Examination  Return in about 1 month (around 04/28/2018) for RUS.   Hollice Espy, MD

## 2018-03-31 NOTE — Addendum Note (Signed)
Addended by: Hollice Espy on: 03/31/2018 08:29 PM   Modules accepted: Level of Service

## 2018-04-08 DIAGNOSIS — E559 Vitamin D deficiency, unspecified: Secondary | ICD-10-CM | POA: Diagnosis not present

## 2018-04-08 DIAGNOSIS — N184 Chronic kidney disease, stage 4 (severe): Secondary | ICD-10-CM | POA: Diagnosis not present

## 2018-04-08 DIAGNOSIS — N2581 Secondary hyperparathyroidism of renal origin: Secondary | ICD-10-CM | POA: Diagnosis not present

## 2018-04-20 ENCOUNTER — Ambulatory Visit
Admission: RE | Admit: 2018-04-20 | Discharge: 2018-04-20 | Disposition: A | Discharge status: Home / Self Care | Payer: PPO | Source: Ambulatory Visit | Attending: Urology | Admitting: Urology

## 2018-04-20 DIAGNOSIS — N2889 Other specified disorders of kidney and ureter: Secondary | ICD-10-CM | POA: Diagnosis not present

## 2018-04-20 DIAGNOSIS — N135 Crossing vessel and stricture of ureter without hydronephrosis: Secondary | ICD-10-CM | POA: Insufficient documentation

## 2018-04-24 ENCOUNTER — Other Ambulatory Visit: Payer: Self-pay | Admitting: Urology

## 2018-05-05 ENCOUNTER — Ambulatory Visit: Payer: PPO | Admitting: Urology

## 2018-05-05 ENCOUNTER — Encounter: Payer: Self-pay | Admitting: Urology

## 2018-05-05 VITALS — BP 114/61 | HR 67 | Ht 74.0 in | Wt 215.0 lb

## 2018-05-05 DIAGNOSIS — E1122 Type 2 diabetes mellitus with diabetic chronic kidney disease: Secondary | ICD-10-CM | POA: Diagnosis not present

## 2018-05-05 DIAGNOSIS — N135 Crossing vessel and stricture of ureter without hydronephrosis: Secondary | ICD-10-CM

## 2018-05-05 DIAGNOSIS — N184 Chronic kidney disease, stage 4 (severe): Secondary | ICD-10-CM | POA: Diagnosis not present

## 2018-05-05 NOTE — Progress Notes (Signed)
05/05/2018 2:34 PM   [name] 10-16-1940 [id number]  Referring provider: Glean Hess, MD [address] [place], [place] [address]-  Chief Complaint  Patient presents with  . Hydronephrosis    1 month RUS    HPI: 77 year old male with history of chronic recurrent right hydronephrosis he returns today with follow-up renal ultrasound.  He is undergone several procedures for right hydronephrosis with possible obstruction.  Most recently, he returned to the operating room on 01/28/2018 for right retrograde pyelogram, right ureteroscopy, ureteral biopsy, selective cytology balloon dilation and stent placement.  He is found to have narrowing within the right proximal ureter with no obvious tumor.  There was hydronephrosis proximal to the tapering at the level of the UPJ.  There is also pulsation noted at this level concerning for possible underlying UPJ obstruction possibly related to crossing vessel..  Biopsies are negative for any dysplasia or malignancy.  He remains completely asymptomatic without right flank pain or any other symptoms.  He did undergo Lasix renogram in 12/2017 with normal split function of his right kidney, 51%, delayed emptying of the left kidney at 30 minutes and essentially non-emptying of the right with impaired washout of the radiotracer within dilated collecting system.  This is felt to be consistent with obstruction.  Reexamination of old CT scans dating back to 2017 show a tapering at the same level but with slight progression of the hydronephrosis.  This appears to be around the location of the gonadal vessels.   Ultimately, he elected to have his stent were removed with plans for conservative management after the aforementioned surgery.  His stent was removed on 03/31/2018.  Follow-up renal ultrasound on 04/21/2017 shows no further hydronephrosis with slightly decreased echogenicity of his kidneys bilaterally.  He continues to deny any  flank pain or any other urinary symptoms.  He does have baseline stage IV CKD with his GFR in the 20s.   PMH: Past Medical History:  Diagnosis Date  . Blood transfusion without reported diagnosis    patient unaware of receiving blood unless it was during surgery and he was not told  . Cancer [facility]) 2001   Colon resection  . Chronic kidney disease 11/2017   stage IV CKD per nephrologist.  . Complication of anesthesia    raspy voice since carotid endarterectomy 07/17/17. paralysis of vocal chords  . Coronary artery disease   . Diabetes mellitus without complication ([place])   . Dysrhythmia    patient unaware of any irregular heart rhythms  . Hyperlipidemia   . Hypertension   . Hypothyroidism   . Kidney mass 2019  . Myocardial infarction ([place]) 1997  . Peripheral vascular disease ([place])   . Pneumonia 2014, 2018   developed after surgery 2018  . Stroke ([place]) 07/2017   mild stroke and then had carotid endarterectomy  . Thyroid disease   . Weight loss 2019   patient has lost over 60 pounds since 07/2017.     Surgical History: Past Surgical History:  Procedure Laterality Date  . APPENDECTOMY  2011  . BACK SURGERY    . CARDIAC CATHETERIZATION  1997  . CAROTID ANGIOGRAPHY Right 06/13/2017   Procedure: Right subclavian and Carotid Angiography, possible intervention;  Surgeon: Algernon Huxley, MD;  Location: [facility];  Service: Cardiovascular;  Laterality: Right;  . CHOLECYSTECTOMY N/A 04/23/2016   had infection post surgery requiring him to debride daily  . 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 CORONARY ARTERY BYPASS GRAFT  1997   x 3  . CYSTOSCOPY W/ RETROGRADES Bilateral 09/03/2017   Procedure: CYSTOSCOPY WITH RETROGRADE PYELOGRAM;  Surgeon: Hollice Espy, MD;  Location: [facility];  Service: Urology;  Laterality: Bilateral;  . CYSTOSCOPY W/ RETROGRADES Right 01/28/2018   Procedure: CYSTOSCOPY WITH RETROGRADE PYELOGRAM;  Surgeon:  Hollice Espy, MD;  Location: [facility];  Service: Urology;  Laterality: Right;  . CYSTOSCOPY W/ URETERAL STENT REMOVAL  08/2017  . CYSTOSCOPY WITH BIOPSY Right 01/28/2018   Procedure: CYSTOSCOPY WITH BIOPSY;  Surgeon: Hollice Espy, MD;  Location: [facility];  Service: Urology;  Laterality: Right;  . CYSTOSCOPY WITH STENT PLACEMENT Right 09/03/2017   Procedure: CYSTOSCOPY WITH STENT PLACEMENT;  Surgeon: Hollice Espy, MD;  Location: [facility];  Service: Urology;  Laterality: Right;  . CYSTOSCOPY WITH STENT PLACEMENT Right 01/28/2018   Procedure: CYSTOSCOPY WITH STENT PLACEMENT;  Surgeon: Hollice Espy, MD;  Location: [facility];  Service: Urology;  Laterality: Right;  . CYSTOSCOPY WITH URETEROSCOPY Right 01/28/2018   Procedure: CYSTOSCOPY WITH URETEROSCOPY;  Surgeon: Hollice Espy, MD;  Location: [facility];  Service: Urology;  Laterality: Right;  . ENDARTERECTOMY Right 07/17/2017   Procedure: ENDARTERECTOMY CAROTID;  Surgeon: Algernon Huxley, MD;  Location: [facility];  Service: Vascular;  Laterality: Right;  . HERNIA REPAIR  2011   Ventral hernia  . HOLMIUM LASER APPLICATION N/A [id number]   Procedure: HOLMIUM LASER APPLICATION;  Surgeon: Hollice Espy, MD;  Location: [facility];  Service: Urology;  Laterality: N/A;  . KNEE SURGERY Left    arthroscopy  . KYPHOPLASTY N/A 08/14/2017   Procedure: [id number];  Surgeon: Hessie Knows, MD;  Location: [facility];  Service: Orthopedics;  Laterality: N/A;  . LARYNX SURGERY    . PROSTATE SURGERY  2002   BPH benign pathology  . SPINE SURGERY  1989   Lumbar disc  . THYROPLASTY Right 01/05/2018   Procedure: THYROPLASTY;  Surgeon: Beverly Gust, MD;  Location: [facility];  Service: ENT;  Laterality: Right;    Home Medications:  Allergies as of 05/05/2018      Reactions   Ace Inhibitors Other (See Comments)   Reaction:  Raises potassium    Quinapril Rash, Other (See Comments)   hyperkalemia      Medication List        Accurate as of 05/05/18  11:59 PM. Always use your most recent med list.          acetaminophen 500 MG tablet Commonly known as:  TYLENOL Take 1,000 mg every 6 (six) hours as needed by mouth for moderate pain or headache.   aspirin 81 MG EC tablet Take 1 tablet (81 mg total) by mouth daily.   atenolol 50 MG tablet Commonly known as:  TENORMIN TAKE 1 TABLET BY MOUTH ONCE DAILY   atorvastatin 40 MG tablet Commonly known as:  LIPITOR Take 1 tablet (40 mg total) daily at 6 PM by mouth.   insulin NPH Human 100 UNIT/ML injection Commonly known as:  HUMULIN N,NOVOLIN N Inject 0.25 mLs (25 Units total) into the skin 2 (two) times daily.   insulin regular 100 units/mL injection Commonly known as:  NOVOLIN R,HUMULIN R Inject 10-15 Units into the skin 2 (two) times daily. 15 units in the morning & 10 units in the evening with supper   levothyroxine 300 MCG tablet Commonly known as:  SYNTHROID, LEVOTHROID Take 300 mcg by mouth daily before breakfast.   tamsulosin 0.4 MG Caps capsule Commonly  known as:  FLOMAX TAKE 1 CAPSULE BY MOUTH ONCE DAILY       Allergies:  Allergies  Allergen Reactions  . Ace Inhibitors Other (See Comments)    Reaction:  Raises potassium   . Quinapril Rash and Other (See Comments)    hyperkalemia    Family History: Family History  Problem Relation Age of Onset  . Cancer Mother        Lung  . Diabetes Mother   . Heart disease Mother   . Heart disease Father     Social History:  reports that he quit smoking about 21 years ago. His smoking use included cigarettes. He quit after 1.00 year of use. He has never used smokeless tobacco. He reports that he does not drink alcohol or use drugs.  ROS: UROLOGY Frequent Urination?: No Hard to postpone urination?: No Burning/pain with urination?: No Get up at night to urinate?: No Leakage of urine?: No Urine stream starts and stops?: No Trouble starting stream?: No Do you have to strain to urinate?: No Blood in urine?:  No Urinary tract infection?: No Sexually transmitted disease?: No Injury to kidneys or bladder?: No Painful intercourse?: No Weak stream?: No Erection problems?: No Penile pain?: No  Gastrointestinal Nausea?: No Vomiting?: No Indigestion/heartburn?: No Diarrhea?: No Constipation?: No  Constitutional Fever: No Night sweats?: No Weight loss?: No Fatigue?: No  Skin Skin rash/lesions?: No Itching?: No  Eyes Blurred vision?: No Double vision?: No  Ears/Nose/Throat Sore throat?: No Sinus problems?: No  Hematologic/Lymphatic Swollen glands?: No Easy bruising?: No  Cardiovascular Leg swelling?: No Chest pain?: No  Respiratory Cough?: No Shortness of breath?: No  Endocrine Excessive thirst?: No  Musculoskeletal Back pain?: No Joint pain?: No  Neurological Headaches?: No Dizziness?: No  Psychologic Depression?: No Anxiety?: No  Physical Exam: BP 114/61   Pulse 67   Ht 6\' 2"  (1.88 m)   Wt 215 lb (97.5 kg)   BMI 27.60 kg/m   Constitutional:  Alert and oriented, No acute distress.  Accompanied by wife. HEENT: [place] AT, moist mucus membranes.  Trachea midline, no masses. Cardiovascular: No clubbing, cyanosis, or edema. Respiratory: Normal respiratory effort, no increased work of breathing. GI: Abdomen is soft, nontender, nondistended, no abdominal masses Skin: No rashes, bruises or suspicious lesions. Neurologic: Grossly intact, no focal deficits, moving all 4 extremities. Psychiatric: Normal mood and affect.  Laboratory Data: Lab Results  Component Value Date   WBC 7.0 12/23/2017   HGB 11.6 (L) 01/28/2018   HCT 34.0 (L) 01/28/2018   MCV 94.5 12/23/2017   PLT 118 (L) 12/23/2017    Lab Results  Component Value Date   CREATININE 3.08 (H) 12/23/2017    Lab Results  Component Value Date   PSA <0.1 05/16/2014    Lab Results  Component Value Date   HGBA1C 7.1 (H) 08/11/2017    Urinalysis N/a  Pertinent Imaging: Results for orders  placed during the hospital encounter of 04/20/18  US RENAL   Narrative CLINICAL DATA:  77 year old male with right ureteral pelvic junction obstruction post dilation. Prior right renal mass post excision November 2018. Subsequent encounter.  EXAM: RENAL / URINARY TRACT ULTRASOUND COMPLETE  COMPARISON:  11/18/2017 ultrasound.  11/13/2017 CT.  FINDINGS: Right Kidney:  Length: 10.2 cm. Slight increased echogenicity. No mass or hydronephrosis.  Left Kidney:  Length: 11.4 cm. Slight increased echogenicity. No mass or hydronephrosis.  Bladder:  Appears normal for degree of bladder distention.  IMPRESSION: No hydronephrosis.  Slight increased renal parenchyma echogenicity.  Electronically Signed   By: Genia Del M.D.   On: 04/21/2018 06:34    RUS personally reviewed today  Assessment & Plan:    1. 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 CKD stage 4 due to type 2 diabetes mellitus (HCC) Creatinine is remained stable with a GFR in the 20s We will recheck his creatinine at next visit unless its recently been performed Given absence of hydronephrosis, do not suspect urinary obstruction is contributing factor   Return in about 6 months (around 11/05/2018) for RUS.  Hollice Espy, MD  [facility] [address], [place], [place] [address] [phone]

## 2018-06-07 ENCOUNTER — Other Ambulatory Visit: Payer: Self-pay | Admitting: Urology

## 2018-06-11 NOTE — Telephone Encounter (Signed)
Pt needs a med refill for Tamsulosin.  Pt's wife called again asking about this.  Pharmacy sent request over.  [phone]

## 2018-06-16 DIAGNOSIS — E1159 Type 2 diabetes mellitus with other circulatory complications: Secondary | ICD-10-CM | POA: Diagnosis not present

## 2018-06-16 DIAGNOSIS — E785 Hyperlipidemia, unspecified: Secondary | ICD-10-CM | POA: Diagnosis not present

## 2018-06-16 DIAGNOSIS — E1169 Type 2 diabetes mellitus with other specified complication: Secondary | ICD-10-CM | POA: Diagnosis not present

## 2018-06-16 DIAGNOSIS — I10 Essential (primary) hypertension: Secondary | ICD-10-CM | POA: Diagnosis not present

## 2018-06-16 DIAGNOSIS — E1142 Type 2 diabetes mellitus with diabetic polyneuropathy: Secondary | ICD-10-CM | POA: Diagnosis not present

## 2018-06-16 LAB — BASIC METABOLIC PANEL
BUN: 57 — AB (ref 4–21)
Creatinine: 3.8 — AB (ref 0.6–1.3)
Glucose: 306
Potassium: 5.6 — AB (ref 3.4–5.3)
Sodium: 135 — AB (ref 137–147)

## 2018-06-16 LAB — HEPATIC FUNCTION PANEL
ALT: 13 (ref 10–40)
AST: 12 — AB (ref 14–40)

## 2018-06-16 LAB — MICROALBUMIN, URINE: Microalb, Ur: POSITIVE

## 2018-06-16 LAB — LIPID PANEL
CHOLESTEROL: 91 (ref 0–200)
HDL: 17 — AB (ref 35–70)
LDL CALC: 41
Triglycerides: 165 — AB (ref 40–160)

## 2018-06-16 LAB — TSH: TSH: 0.14 — AB (ref 0.41–5.90)

## 2018-06-16 LAB — HEMOGLOBIN A1C: Hgb A1c MFr Bld: 9.8 — AB (ref 4.0–6.0)

## 2018-06-22 ENCOUNTER — Ambulatory Visit (INDEPENDENT_AMBULATORY_CARE_PROVIDER_SITE_OTHER): Payer: PPO

## 2018-06-22 VITALS — BP 134/74 | HR 64 | Temp 98.0°F | Ht 74.0 in | Wt 215.0 lb

## 2018-06-22 DIAGNOSIS — Z23 Encounter for immunization: Secondary | ICD-10-CM | POA: Diagnosis not present

## 2018-06-22 DIAGNOSIS — E1165 Type 2 diabetes mellitus with hyperglycemia: Secondary | ICD-10-CM

## 2018-06-22 DIAGNOSIS — E785 Hyperlipidemia, unspecified: Secondary | ICD-10-CM | POA: Diagnosis not present

## 2018-06-22 DIAGNOSIS — Z794 Long term (current) use of insulin: Secondary | ICD-10-CM

## 2018-06-22 DIAGNOSIS — E1169 Type 2 diabetes mellitus with other specified complication: Secondary | ICD-10-CM | POA: Diagnosis not present

## 2018-06-22 DIAGNOSIS — N184 Chronic kidney disease, stage 4 (severe): Secondary | ICD-10-CM

## 2018-06-22 DIAGNOSIS — Z0000 Encounter for general adult medical examination without abnormal findings: Secondary | ICD-10-CM | POA: Diagnosis not present

## 2018-06-22 DIAGNOSIS — E1142 Type 2 diabetes mellitus with diabetic polyneuropathy: Secondary | ICD-10-CM | POA: Diagnosis not present

## 2018-06-22 DIAGNOSIS — E1122 Type 2 diabetes mellitus with diabetic chronic kidney disease: Secondary | ICD-10-CM

## 2018-06-22 DIAGNOSIS — E1159 Type 2 diabetes mellitus with other circulatory complications: Secondary | ICD-10-CM | POA: Diagnosis not present

## 2018-06-22 DIAGNOSIS — I10 Essential (primary) hypertension: Secondary | ICD-10-CM | POA: Diagnosis not present

## 2018-06-22 DIAGNOSIS — IMO0002 Reserved for concepts with insufficient information to code with codable children: Secondary | ICD-10-CM

## 2018-06-22 NOTE — Patient Instructions (Signed)
[name] , Thank you for taking time to come for your Medicare Wellness Visit. I appreciate your ongoing commitment to your health goals. Please review the following plan we discussed and let me know if I can assist you in the future.   Screening recommendations/referrals: Colorectal Screening: No longer required  Vision and Dental Exams: Recommended annual ophthalmology exams for early detection of glaucoma and other disorders of the eye Recommended annual dental exams for proper oral hygiene  Diabetic Exams: Recommended annual diabetic eye exams for early detection of retinopathy Recommended annual diabetic foot exams for early detection of peripheral neuropathy.  Diabetic Eye Exam: You will receive a call from our office regarding your appointment Diabetic Foot Exam: Please keep your appointment as scheduled with Dr. Army Melia  Vaccinations: Influenza vaccine: Completed today Pneumococcal vaccine: Up to date Tdap vaccine: Up to date Shingles vaccine: Please call your insurance company to determine your out of pocket expense for the Shingrix vaccine. You may also receive this vaccine at your local pharmacy or Health Dept.    Advanced directives: Please bring a copy of your POA (Power of Attorney) and/or Living Will to your next appointment.  Goals: Recommend to drink at least 6-8 8oz glasses of water per day.  Next appointment: Please schedule your Annual Wellness Visit with your Nurse Health Advisor in one year.  Preventive Care 22 Years and Older, Male Preventive care refers to lifestyle choices and visits with your health care provider that can promote health and wellness. What does preventive care include?  A yearly physical exam. This is also called an annual well check.  Dental exams once or twice a year.  Routine eye exams. Ask your health care provider how often you should have your eyes checked.  Personal lifestyle choices, including:  Daily care of your teeth and  gums.  Regular physical activity.  Eating a healthy diet.  Avoiding tobacco and drug use.  Limiting alcohol use.  Practicing safe sex.  Taking low doses of aspirin every day.  Taking vitamin and mineral supplements as recommended by your health care provider. What happens during an annual well check? The services and screenings done by your health care provider during your annual well check will depend on your age, overall health, lifestyle risk factors, and family history of disease. Counseling  Your health care provider may ask you questions about your:  Alcohol use.  Tobacco use.  Drug use.  Emotional well-being.  Home and relationship well-being.  Sexual activity.  Eating habits.  History of falls.  Memory and ability to understand (cognition).  Work and work Statistician. Screening  You may have the following tests or measurements:  Height, weight, and BMI.  Blood pressure.  Lipid and cholesterol levels. These may be checked every 5 years, or more frequently if you are over 79 years old.  Skin check.  Lung cancer screening. You may have this screening every year starting at age 25 if you have a 30-pack-year history of smoking and currently smoke or have quit within the past 15 years.  Fecal occult blood test (FOBT) of the stool. You may have this test every year starting at age 83.  Flexible sigmoidoscopy or colonoscopy. You may have a sigmoidoscopy every 5 years or a colonoscopy every 10 years starting at age 32.  Prostate cancer screening. Recommendations will vary depending on your family history and other risks.  Hepatitis C blood test.  Hepatitis B blood test.  Sexually transmitted disease (STD) testing.  Diabetes  screening. This is done by checking your blood sugar (glucose) after you have not eaten for a while (fasting). You may have this done every 1-3 years.  Abdominal aortic aneurysm (AAA) screening. You may need this if you are a  current or former smoker.  Osteoporosis. You may be screened starting at age 68 if you are at high risk. Talk with your health care provider about your test results, treatment options, and if necessary, the need for more tests. Vaccines  Your health care provider may recommend certain vaccines, such as:  Influenza vaccine. This is recommended every year.  Tetanus, diphtheria, and acellular pertussis (Tdap, Td) vaccine. You may need a Td booster every 10 years.  Zoster vaccine. You may need this after age 47.  Pneumococcal 13-valent conjugate (PCV13) vaccine. One dose is recommended after age 5.  Pneumococcal polysaccharide (PPSV23) vaccine. One dose is recommended after age 26. Talk to your health care provider about which screenings and vaccines you need and how often you need them. This information is not intended to replace advice given to you by your health care provider. Make sure you discuss any questions you have with your health care provider. Document Released: 10/27/2015 Document Revised: 06/19/2016 Document Reviewed: 08/01/2015 Elsevier Interactive Patient Education  2017 [place] Prevention in the Home Falls can cause injuries. They can happen to people of all ages. There are many things you can do to make your home safe and to help prevent falls. What can I do on the outside of my home?  Regularly fix the edges of walkways and driveways and fix any cracks.  Remove anything that might make you trip as you walk through a door, such as a raised step or threshold.  Trim any bushes or trees on the path to your home.  Use bright outdoor lighting.  Clear any walking paths of anything that might make someone trip, such as rocks or tools.  Regularly check to see if handrails are loose or broken. Make sure that both sides of any steps have handrails.  Any raised decks and porches should have guardrails on the edges.  Have any leaves, snow, or ice cleared  regularly.  Use sand or salt on walking paths during winter.  Clean up any spills in your garage right away. This includes oil or grease spills. What can I do in the bathroom?  Use night lights.  Install grab bars by the toilet and in the tub and shower. Do not use towel bars as grab bars.  Use non-skid mats or decals in the tub or shower.  If you need to sit down in the shower, use a plastic, non-slip stool.  Keep the floor dry. Clean up any water that spills on the floor as soon as it happens.  Remove soap buildup in the tub or shower regularly.  Attach bath mats securely with double-sided non-slip rug tape.  Do not have throw rugs and other things on the floor that can make you trip. What can I do in the bedroom?  Use night lights.  Make sure that you have a light by your bed that is easy to reach.  Do not use any sheets or blankets that are too big for your bed. They should not hang down onto the floor.  Have a firm chair that has side arms. You can use this for support while you get dressed.  Do not have throw rugs and other things on the floor that can make  you trip. What can I do in the kitchen?  Clean up any spills right away.  Avoid walking on wet floors.  Keep items that you use a lot in easy-to-reach places.  If you need to reach something above you, use a strong step stool that has a grab bar.  Keep electrical cords out of the way.  Do not use floor polish or wax that makes floors slippery. If you must use wax, use non-skid floor wax.  Do not have throw rugs and other things on the floor that can make you trip. What can I do with my stairs?  Do not leave any items on the stairs.  Make sure that there are handrails on both sides of the stairs and use them. Fix handrails that are broken or loose. Make sure that handrails are as long as the stairways.  Check any carpeting to make sure that it is firmly attached to the stairs. Fix any carpet that is loose  or worn.  Avoid having throw rugs at the top or bottom of the stairs. If you do have throw rugs, attach them to the floor with carpet tape.  Make sure that you have a light switch at the top of the stairs and the bottom of the stairs. If you do not have them, ask someone to add them for you. What else can I do to help prevent falls?  Wear shoes that:  Do not have high heels.  Have rubber bottoms.  Are comfortable and fit you well.  Are closed at the toe. Do not wear sandals.  If you use a stepladder:  Make sure that it is fully opened. Do not climb a closed stepladder.  Make sure that both sides of the stepladder are locked into place.  Ask someone to hold it for you, if possible.  Clearly mark and make sure that you can see:  Any grab bars or handrails.  First and last steps.  Where the edge of each step is.  Use tools that help you move around (mobility aids) if they are needed. These include:  Canes.  Walkers.  Scooters.  Crutches.  Turn on the lights when you go into a dark area. Replace any light bulbs as soon as they burn out.  Set up your furniture so you have a clear path. Avoid moving your furniture around.  If any of your floors are uneven, fix them.  If there are any pets around you, be aware of where they are.  Review your medicines with your doctor. Some medicines can make you feel dizzy. This can increase your chance of falling. Ask your doctor what other things that you can do to help prevent falls. This information is not intended to replace advice given to you by your health care provider. Make sure you discuss any questions you have with your health care provider. Document Released: 07/27/2009 Document Revised: 03/07/2016 Document Reviewed: 11/04/2014 Elsevier Interactive Patient Education  2017 [employer].

## 2018-06-22 NOTE — Progress Notes (Signed)
Subjective:   [name] is a 77 y.o. male who presents for Medicare Annual/Subsequent preventive examination.  Review of Systems:  N/A Cardiac Risk Factors include: advanced age (>29mn, >>8women);male gender;hypertension;dyslipidemia;diabetes mellitus;sedentary lifestyle     Objective:    Vitals: BP 134/74 (BP Location: Right Arm, Patient Position: Sitting, Cuff Size: Normal)   Pulse 64   Temp 98 F (36.7 C) (Oral)   Ht _0  (1.88 m)   Wt 215 lb (97.5 kg)   SpO2 94%   BMI 27.60 kg/m   Body mass index is 27.6 kg/m.  Advanced Directives 06/22/2018 12/23/2017 12/13/2017 12/11/2017 11/25/2017 11/17/2017 11/13/2017  Does Patient Have a Medical Advance Directive? No No No No - No No  Does patient want to make changes to medical advance directive? - - - - - - -  Would patient like information on creating a medical advance directive? Yes (MAU/Ambulatory/Procedural Areas - Information given) No - Patient declined No - Patient declined Yes (MAU/Ambulatory/Procedural Areas - Information given) (No Data) Yes (MAU/Ambulatory/Procedural Areas - Information given) No - Patient declined    Tobacco Social History   Tobacco Use  Smoking Status Former Smoker  . Packs/day: 1.50  . Years: 44.00  . Pack years: 66.00  . Types: Cigarettes  . Last attempt to quit: 06/24/1996  . Years since quitting: 22.0  Smokeless Tobacco Never Used  Tobacco Comment   smoking cessation materials not required     Counseling given: No Comment: smoking cessation materials not required  Clinical Intake:  Pre-visit preparation completed: Yes  Pain : No/denies pain   BMI - recorded: 27.6 Nutritional Status: BMI 25 -29 Overweight Nutritional Risks: None  Nutrition Risk Assessment: Has the patient had any N/V/D within the last 2 months?  No Does the patient have any non-healing wounds?  No Has the patient had any unintentional weight loss or weight gain?  No  Is the patient diabetic?  Yes If diabetic,  was a CBG obtained today?  No Did the patient bring in their glucometer from home?  No Comments: Pt monitors CBG's twice daily. Denies any financial strains with the device or supplies.  Diabetic Exams: Diabetic Eye Exam: Overdue for diabetic eye exam. Pt has been advised about the importance in completing this exam. A referral has been placed today. Message sent to referral coordinator for scheduling purposes. Advised pt to expect a call from our office re: appt. Diabetic Foot Exam: Completed 07/22/16. Pt has been advised about the importance in completing this exam. Pt is scheduled for diabetic foot exam on 06/29/18 with Dr. BArmy Melia  How often do you need to have someone help you when you read instructions, pamphlets, or other written materials from your doctor or pharmacy?: 1 - Never  Interpreter Needed?: No  Information entered by :: AIdell Pickles LPN  Past Medical History:  Diagnosis Date  . Blood transfusion without reported diagnosis    patient unaware of receiving blood unless it was during surgery and he was not told  . Cancer ([facility] 2001   Colon resection  . Chronic kidney disease 11/2017   stage IV CKD per nephrologist.  . Complication of anesthesia    raspy voice since carotid endarterectomy 07/17/17. paralysis of vocal chords  . Coronary artery disease   . [place]   . Dysrhythmia    patient unaware of any irregular heart rhythms  . Hyperlipidemia   . Hypertension   . Hypothyroidism   . Kidney mass 2019  .  Myocardial infarction ([place]) 1997  . Peripheral vascular disease ([place])   . Pneumonia 2014, 2018   developed after surgery 2018  . Stroke ([place]) 07/2017   mild stroke and then had carotid endarterectomy  . Thyroid disease   . Weight loss 2019   patient has lost over 60 pounds since 07/2017.    Past Surgical History:  Procedure Laterality Date  . APPENDECTOMY  2011  . BACK SURGERY    . CARDIAC CATHETERIZATION  1997  . CAROTID  ANGIOGRAPHY Right 06/13/2017   Procedure: Right subclavian and Carotid Angiography, possible intervention;  Surgeon: Algernon Huxley, MD;  Location: [place] [facility];  Service: Cardiovascular;  Laterality: Right;  . CHOLECYSTECTOMY N/A 04/23/2016   had infection post surgery requiring him to debride daily  . 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 CORONARY ARTERY BYPASS GRAFT  1997   x 3  . CYSTOSCOPY W/ RETROGRADES Bilateral 09/03/2017   Procedure: CYSTOSCOPY WITH RETROGRADE PYELOGRAM;  Surgeon: Hollice Espy, MD;  Location: [facility];  Service: Urology;  Laterality: Bilateral;  . CYSTOSCOPY W/ RETROGRADES Right 01/28/2018   Procedure: CYSTOSCOPY WITH RETROGRADE PYELOGRAM;  Surgeon: Hollice Espy, MD;  Location: [facility];  Service: Urology;  Laterality: Right;  . CYSTOSCOPY W/ URETERAL STENT REMOVAL  08/2017  . CYSTOSCOPY WITH BIOPSY Right 01/28/2018   Procedure: CYSTOSCOPY WITH BIOPSY;  Surgeon: Hollice Espy, MD;  Location: [facility];  Service: Urology;  Laterality: Right;  . CYSTOSCOPY WITH STENT PLACEMENT Right 09/03/2017   Procedure: CYSTOSCOPY WITH STENT PLACEMENT;  Surgeon: Hollice Espy, MD;  Location: [facility];  Service: Urology;  Laterality: Right;  . CYSTOSCOPY WITH STENT PLACEMENT Right 01/28/2018   Procedure: CYSTOSCOPY WITH STENT PLACEMENT;  Surgeon: Hollice Espy, MD;  Location: [facility];  Service: Urology;  Laterality: Right;  . CYSTOSCOPY WITH URETEROSCOPY Right 01/28/2018   Procedure: CYSTOSCOPY WITH URETEROSCOPY;  Surgeon: Hollice Espy, MD;  Location: [facility];  Service: Urology;  Laterality: Right;  . ENDARTERECTOMY Right 07/17/2017   Procedure: ENDARTERECTOMY CAROTID;  Surgeon: Algernon Huxley, MD;  Location: [facility];  Service: Vascular;  Laterality: Right;  . HERNIA REPAIR  2011   Ventral hernia  . HOLMIUM LASER APPLICATION N/A [id number]   Procedure: HOLMIUM LASER APPLICATION;  Surgeon: Hollice Espy, MD;  Location:  [facility];  Service: Urology;  Laterality: N/A;  . KNEE SURGERY Left    arthroscopy  . KYPHOPLASTY N/A 08/14/2017   Procedure: [id number];  Surgeon: Hessie Knows, MD;  Location: [facility];  Service: Orthopedics;  Laterality: N/A;  . LARYNX SURGERY    . PROSTATE SURGERY  2002   BPH benign pathology  . SPINE SURGERY  1989   Lumbar disc  . THYROPLASTY Right 01/05/2018   Procedure: THYROPLASTY;  Surgeon: Beverly Gust, MD;  Location: [facility];  Service: ENT;  Laterality: Right;   Family History  Problem Relation Age of Onset  . Diabetes Mother   . Heart disease Mother   . Heart disease Father   . Cancer Father        lung   Social History   Socioeconomic History  . Marital status: Married    Spouse name: Not on file  . Number of children: 2  . Years of education: some college  . Highest education level: 12th grade  Occupational History  . Occupation: retired  Scientific laboratory technician  . Financial resource strain: Not hard at all  . Food insecurity:  Worry: Never true    Inability: Never true  . Transportation needs:    Medical: No    Non-medical: No  Tobacco Use  . Smoking status: Former Smoker    Packs/day: 1.50    Years: 44.00    Pack years: 66.00    Types: Cigarettes    Last attempt to quit: 06/24/1996    Years since quitting: 22.0  . Smokeless tobacco: Never Used  . Tobacco comment: smoking cessation materials not required  Substance and Sexual Activity  . Alcohol use: Yes    Alcohol/week: 0.0 standard drinks    Comment: rare  . Drug use: No  . Sexual activity: Never  Lifestyle  . Physical activity:    Days per week: 0 days    Minutes per session: 0 min  . Stress: Not at all  Relationships  . Social connections:    Talks on phone: Patient refused    Gets together: Patient refused    Attends religious service: Patient refused    Active member of club or organization: Patient refused    Attends meetings of clubs or organizations: Patient refused     Relationship status: Married  Other Topics Concern  . Not on file  Social History Narrative  . Not on file    Outpatient Encounter Medications as of 06/22/2018  Medication Sig  . acetaminophen (TYLENOL) 500 MG tablet Take 1,000 mg every 6 (six) hours as needed by mouth for moderate pain or headache.  Marland Kitchen aspirin EC 81 MG EC tablet Take 1 tablet (81 mg total) by mouth daily.  Marland Kitchen atenolol (TENORMIN) 50 MG tablet TAKE 1 TABLET BY MOUTH ONCE DAILY  . atorvastatin (LIPITOR) 40 MG tablet Take 1 tablet (40 mg total) daily at 6 PM by mouth.  . Blood Glucose Monitoring Suppl (FIFTY50 GLUCOSE METER 2.0) w/Device KIT 1 Device by Other route 2 (two) times daily.  . calcitRIOL (ROCALTROL) 0.25 MCG capsule Take 0.25 mcg by mouth daily.  . clopidogrel (PLAVIX) 75 MG tablet Take 75 mg by mouth daily.  Marland Kitchen glucose blood (RELION PRIME TEST) test strip 1 strip by Other route 2 (two) times daily.  . insulin NPH Human (HUMULIN N,NOVOLIN N) 100 UNIT/ML injection Inject 0.25 mLs (25 Units total) into the skin 2 (two) times daily. (Patient taking differently: Inject 30 Units into the skin 2 (two) times daily. )  . insulin regular (NOVOLIN R,HUMULIN R) 100 units/mL injection Inject 10-15 Units into the skin 2 (two) times daily. 15 units in the morning & 10 units in the evening with supper  . levothyroxine (SYNTHROID, LEVOTHROID) 300 MCG tablet Take 300 mcg by mouth daily before breakfast.  . sodium bicarbonate 650 MG tablet Take 650 mg by mouth 2 (two) times daily.  . tamsulosin (FLOMAX) 0.4 MG CAPS capsule TAKE 1 CAPSULE BY MOUTH ONCE DAILY   No facility-administered encounter medications on file as of 06/22/2018.     Activities of Daily Living In your present state of health, do you have any difficulty performing the following activities: 06/22/2018 12/11/2017  Hearing? N N  Comment denies hearing aids -  Vision? N N  Comment wears eyeglasses -  Difficulty concentrating or making decisions? Y N  Comment short term  memory loss -  Walking or climbing stairs? N N  Comment - -  Dressing or bathing? N N  Comment - -  Doing errands, shopping? N [place] and eating ? N -  Comment full  set upper dentures -  Using the Toilet? N -  In the past six months, have you accidently leaked urine? N -  Comment - -  Do you have problems with loss of bowel control? N -  Managing your Medications? N -  Comment - -  Managing your Finances? N -  Housekeeping or managing your Housekeeping? N -  Comment - -  Some recent data might be hidden    Patient Care Team: Glean Hess, MD as PCP - General (Internal Medicine) Lloyd Huger, MD as Consulting Physician (Oncology) Ubaldo Glassing Javier Docker, MD as Consulting Physician (Cardiology) Hollice Espy, MD as Consulting Physician (Urology) Lonia Farber, MD as Consulting Physician (Internal Medicine) Hessie Knows, MD as Consulting Physician (Orthopedic Surgery) Beverly Gust, MD as Consulting Physician (Otolaryngology) Lucky Cowboy Erskine Squibb, MD as Consulting Physician (Vascular Surgery)   Assessment:   This is a routine wellness examination for [name].  Exercise Activities and Dietary recommendations Current Exercise Habits: The patient does not participate in regular exercise at present, Exercise limited by: None identified  Goals    . DIET - INCREASE WATER INTAKE     Recommend to drink at least 6-8 8oz glasses of water per day.    . Finances     Get truck on road again and get ALL finances caught up.        Fall Risk Fall Risk  06/22/2018 11/17/2017 08/20/2017 07/31/2017 06/20/2017  Falls in the past year? Yes Yes Yes Yes No  Number falls in past yr: _0 -  Injury with Fall? Yes Yes Yes Yes -  Comment broken vertebrae - - - -  Risk Factor Category  High Fall Risk - High Fall Risk High Fall Risk -  Risk for fall due to : History of fall(s);Impaired vision;Other (Comment);Impaired balance/gait History of fall(s) History of  fall(s);Impaired balance/gait History of fall(s);Impaired balance/gait;Impaired mobility Impaired balance/gait  Risk for fall due to: Comment h/o CVA; wears eyeglasses; use of walker or cane when needed - - - -  Follow up Falls evaluation completed;Education provided;Falls prevention discussed - Falls prevention discussed Falls evaluation completed;Falls prevention discussed;Education provided -   FALL RISK PREVENTION PERTAINING TO HOME: Is your home free of loose throw rugs in walkways, pet beds, electrical cords, etc? Yes Is there adequate lighting in your home to reduce risk of falls?  Yes Are there stairs in or around your home WITH handrails? Yes  ASSISTIVE DEVICES UTILIZED TO PREVENT FALLS: Use of a cane, walker or w/c? Yes if needed Grab bars in the bathroom? Yes  Shower chair or a place to sit while bathing? Yes An elevated toilet seat or a handicapped toilet? Yes  Timed Get Up and Go Performed: Yes. Pt ambulated 10 feet within 12 sec. Gait slow, steady and without the use of an assistive device. No intervention required at this time. Fall risk prevention has been discussed.  Community Resource Referral:  [place] Referral not required at this time.   Depression Screen PHQ 2/9 Scores 06/22/2018 11/17/2017 09/10/2017 07/31/2017  PHQ - 2 Score 0 0 0 0  PHQ- 9 Score 0 - - -    Cognitive Function     6CIT Screen 06/22/2018 07/22/2016  What Year? 0 points 0 points  What month? 3 points 0 points  What time? 0 points 0 points  Count back from 20 0 points 0 points  Months in reverse 0 points 0 points  Repeat phrase 6 points 0  points  Total Score 9 0    Immunization History  Administered Date(s) Administered  . Influenza, High Dose Seasonal PF 06/22/2018  . Influenza,inj,Quad PF,6+ Mos 09/14/2015, 07/22/2016, 07/07/2017  . Pneumococcal Conjugate-13 03/19/2016  . Pneumococcal Polysaccharide-23 03/09/2013  . Tdap 01/19/2013, 03/09/2013    Qualifies for Shingles  Vaccine? Yes. Due for Shingrix. Education has been provided regarding the importance of this vaccine. Pt has been advised to call insurance company to determine out of pocket expense. Advised may also receive vaccine at local pharmacy or Health Dept. Verbalized acceptance and understanding.  Screening Tests Health Maintenance  Topic Date Due  . OPHTHALMOLOGY EXAM  01/25/1951  . FOOT EXAM  07/22/2017  . URINE MICROALBUMIN  07/22/2017  . HEMOGLOBIN A1C  02/09/2018  . TETANUS/TDAP  03/10/2023  . INFLUENZA VACCINE  Completed  . PNA vac Low Risk Adult  Completed   Cancer Screenings: Lung: Low Dose CT Chest recommended if Age 6-80 years, 30 pack-year currently smoking OR have quit w/in 15years. Patient does not qualify. Colorectal: No longer required  Additional Screenings: Hepatitis C Screening: Does not qualify     Plan:  I have personally reviewed and addressed the Medicare Annual Wellness questionnaire and have noted the following in the patient's chart:  A. Medical and social history B. Use of alcohol, tobacco or illicit drugs  C. Current medications and supplements D. Functional ability and status E.  Nutritional status F.  Physical activity G. Advance directives H. List of other physicians I.  Hospitalizations, surgeries, and ER visits in previous 12 months J.  [place] such as hearing and vision if needed, cognitive and depression L. Referrals and appointments  In addition, I have reviewed and discussed with patient certain preventive protocols, quality metrics, and best practice recommendations. A written personalized care plan for preventive services as well as general preventive health recommendations were provided to patient.  Signed,  Aleatha Borer, LPN Nurse Health Advisor  MD Recommendations: Due for Shingrix. Education has been provided regarding the importance of this vaccine. Pt has been advised to call insurance company to determine out of pocket  expense. Advised may also receive vaccine at local pharmacy or Health Dept. Verbalized acceptance and understanding.  Diabetic Eye Exam: Overdue for diabetic eye exam. Pt has been advised about the importance in completing this exam. A referral has been placed today. Message sent to referral coordinator for scheduling purposes. Advised pt to expect a call from our office re: appt.  Diabetic Foot Exam: Completed 07/22/16. Pt has been advised about the importance in completing this exam. Pt is scheduled for diabetic foot exam on 06/29/18 with Dr. Army Melia.

## 2018-06-23 ENCOUNTER — Other Ambulatory Visit: Payer: Self-pay | Admitting: Urology

## 2018-06-23 MED ORDER — TAMSULOSIN HCL 0.4 MG PO CAPS: 0.4000 mg | ORAL_CAPSULE | Freq: Every day | ORAL | 0 refills | Status: DC

## 2018-06-23 NOTE — Progress Notes (Signed)
Flomax script in.

## 2018-06-28 ENCOUNTER — Encounter: Payer: Self-pay | Admitting: Internal Medicine

## 2018-06-29 ENCOUNTER — Ambulatory Visit (INDEPENDENT_AMBULATORY_CARE_PROVIDER_SITE_OTHER): Payer: PPO | Admitting: Internal Medicine

## 2018-06-29 ENCOUNTER — Encounter: Payer: Self-pay | Admitting: Internal Medicine

## 2018-06-29 VITALS — BP 138/62 | HR 64 | Ht 74.0 in | Wt 213.0 lb

## 2018-06-29 DIAGNOSIS — E1122 Type 2 diabetes mellitus with diabetic chronic kidney disease: Secondary | ICD-10-CM

## 2018-06-29 DIAGNOSIS — Z794 Long term (current) use of insulin: Secondary | ICD-10-CM

## 2018-06-29 DIAGNOSIS — IMO0002 Reserved for concepts with insufficient information to code with codable children: Secondary | ICD-10-CM

## 2018-06-29 DIAGNOSIS — I10 Essential (primary) hypertension: Secondary | ICD-10-CM

## 2018-06-29 DIAGNOSIS — E039 Hypothyroidism, unspecified: Secondary | ICD-10-CM

## 2018-06-29 DIAGNOSIS — N184 Chronic kidney disease, stage 4 (severe): Secondary | ICD-10-CM

## 2018-06-29 DIAGNOSIS — E1165 Type 2 diabetes mellitus with hyperglycemia: Secondary | ICD-10-CM

## 2018-06-29 DIAGNOSIS — I129 Hypertensive chronic kidney disease with stage 1 through stage 4 chronic kidney disease, or unspecified chronic kidney disease: Secondary | ICD-10-CM

## 2018-06-29 DIAGNOSIS — E1169 Type 2 diabetes mellitus with other specified complication: Secondary | ICD-10-CM

## 2018-06-29 DIAGNOSIS — E785 Hyperlipidemia, unspecified: Secondary | ICD-10-CM | POA: Diagnosis not present

## 2018-06-29 DIAGNOSIS — Z0000 Encounter for general adult medical examination without abnormal findings: Secondary | ICD-10-CM

## 2018-06-29 NOTE — Patient Instructions (Addendum)
[facility] in [place]  [phone]

## 2018-06-29 NOTE — Progress Notes (Signed)
Date:  06/29/2018   Name:  [name]   DOB:  [birth year]   MRN:  [id number]   Chief Complaint: Annual Exam [name] is a 77 y.o. male who presents today for his Complete Annual Exam. He feels well. He reports exercising very little - most just working on cars intermittently. He reports he is sleeping fairly well.  He is due for DM eye exam but can't go to [place] any more.  He does not want to go to [place] due to past issues. He has had his influenza vaccine.  DM - he is seeing Endo, on Insulin NPH and R.  He can not afford other treatment.  His A1C has been higher - he is to start check BS more often.  He needs eye exam - I suggest [facility] in [place].  Hypothyroid - followed by Endo, recently had dose reduced to 1/2 tab one day a week.  HTN - on atenolol with normal readings.   Had a mild CVA last year.  On aspirin and plavix.  Follow by Dr. Ubaldo Glassing.  Lipids - s/p CEA, recent labs good on atorvastatin.  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  Recent GFR slightly worse.  Re-educated about low potassium diet, started on Calcitriol for secondary Hyperparathyroidism.  Lab Results  Component Value Date   HGBA1C 9.8 (A) 06/16/2018   Lab Results  Component Value Date   CREATININE 3.8 (A) 06/16/2018   BUN 57 (A) 06/16/2018   NA 135 (A) 06/16/2018   K 5.6 (A) 06/16/2018   CL 112 (H) 12/23/2017   CO2 19 (L) 12/23/2017   Lab Results  Component Value Date   CHOL 91 06/16/2018   HDL 17 (A) 06/16/2018   LDLCALC 41 06/16/2018   TRIG 165 (A) 06/16/2018   CHOLHDL 6.4 06/12/2017   Lab Results  Component Value Date   TSH 0.14 (A) 06/16/2018    Review of Systems  Constitutional: Negative for appetite change, fatigue and fever.  HENT: Positive for voice change. Negative for hearing loss and trouble swallowing.   Eyes: Negative for visual disturbance.  Respiratory: Negative for choking, chest tightness and shortness of breath.   Cardiovascular: Negative for chest  pain, palpitations and leg swelling.  Gastrointestinal: Negative for abdominal pain, constipation and diarrhea.  Genitourinary: Negative for dysuria and hematuria.  Skin: Negative for color change and rash.  Allergic/Immunologic: Negative for environmental allergies.  Neurological: Negative for dizziness, tremors, light-headedness and headaches.  Hematological: Negative for adenopathy.  Psychiatric/Behavioral: Negative for dysphoric mood and sleep disturbance.    Patient Active Problem List   Diagnosis Date Noted  . Hypertension 03/27/2018  . Hematuria 11/14/2017  . Urinary retention 08/28/2017  . CAP (community acquired pneumonia) 08/11/2017  . Bilateral carotid artery stenosis 08/01/2017  . Hoarseness of voice 08/01/2017  . Compression fracture of body of thoracic vertebra ([place]) 07/28/2017  . Carotid stenosis, symptomatic, with infarction ([place]) 07/17/2017  . Cerebrovascular accident (CVA) ([place]) 06/11/2017  . Chronic shoulder bursitis, left 07/22/2016  . Cholecystitis, acute 04/21/2016  . 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 CKD stage 4 due to type 2 diabetes mellitus ([place]) 03/16/2015  . Diabetes mellitus with polyneuropathy ([place]) 03/16/2015  . CAD in native artery 01/20/2015  . Gout 01/20/2015  . Diabetic peripheral neuropathy ([place]) 01/20/2015  . H/O malignant neoplasm of colon  01/20/2015  . Adult hypothyroidism 01/20/2015  . Hyperlipidemia associated with type 2 diabetes mellitus ([place]) 01/20/2015  . Encounter for long-term (current) use of insulin ([place]) 07/27/2014  . High potassium 05/19/2014  . 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 Hypertension in stage 4 chronic kidney disease due to type 2 diabetes mellitus ([place]) 07/15/2013    Allergies  Allergen Reactions  . Ace Inhibitors Other (See Comments)      Reaction:  Raises potassium   . Quinapril Rash and Other (See Comments)    hyperkalemia    Past Surgical History:  Procedure Laterality Date  . APPENDECTOMY  2011  . BACK SURGERY    . CARDIAC CATHETERIZATION  1997  . CAROTID ANGIOGRAPHY Right 06/13/2017   Procedure: Right subclavian and Carotid Angiography, possible intervention;  Surgeon: Algernon Huxley, MD;  Location: [facility];  Service: Cardiovascular;  Laterality: Right;  . CHOLECYSTECTOMY N/A 04/23/2016   had infection post surgery requiring him to debride daily  . 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 CORONARY ARTERY BYPASS GRAFT  1997   x 3  . CYSTOSCOPY W/ RETROGRADES Bilateral 09/03/2017   Procedure: CYSTOSCOPY WITH RETROGRADE PYELOGRAM;  Surgeon: Hollice Espy, MD;  Location: [facility];  Service: Urology;  Laterality: Bilateral;  . CYSTOSCOPY W/ RETROGRADES Right 01/28/2018   Procedure: CYSTOSCOPY WITH RETROGRADE PYELOGRAM;  Surgeon: Hollice Espy, MD;  Location: [facility];  Service: Urology;  Laterality: Right;  . CYSTOSCOPY W/ URETERAL STENT REMOVAL  08/2017  . CYSTOSCOPY WITH BIOPSY Right 01/28/2018   Procedure: CYSTOSCOPY WITH BIOPSY;  Surgeon: Hollice Espy, MD;  Location: [facility];  Service: Urology;  Laterality: Right;  . CYSTOSCOPY WITH STENT PLACEMENT Right 09/03/2017   Procedure: CYSTOSCOPY WITH STENT PLACEMENT;  Surgeon: Hollice Espy, MD;  Location: [facility];  Service: Urology;  Laterality: Right;  . CYSTOSCOPY WITH STENT PLACEMENT Right 01/28/2018   Procedure: CYSTOSCOPY WITH STENT PLACEMENT;  Surgeon: Hollice Espy, MD;  Location: [facility];  Service: Urology;  Laterality: Right;  . CYSTOSCOPY WITH URETEROSCOPY Right 01/28/2018   Procedure: CYSTOSCOPY WITH URETEROSCOPY;  Surgeon: Hollice Espy, MD;  Location: [facility];  Service: Urology;  Laterality: Right;  . ENDARTERECTOMY Right 07/17/2017   Procedure: ENDARTERECTOMY CAROTID;  Surgeon: Algernon Huxley,  MD;  Location: [facility];  Service: Vascular;  Laterality: Right;  . HERNIA REPAIR  2011   Ventral hernia  . HOLMIUM LASER APPLICATION N/A 32/54/9826   Procedure: HOLMIUM LASER APPLICATION;  Surgeon: Hollice Espy, MD;  Location: [facility];  Service: Urology;  Laterality: N/A;  . KNEE SURGERY Left    arthroscopy  . KYPHOPLASTY N/A 08/14/2017   Procedure: [id number];  Surgeon: Hessie Knows, MD;  Location: [facility];  Service: Orthopedics;  Laterality: N/A;  . LARYNX SURGERY    . PROSTATE SURGERY  2002   BPH benign pathology  . SPINE SURGERY  1989   Lumbar disc  . THYROPLASTY Right 01/05/2018   Procedure: THYROPLASTY;  Surgeon: Beverly Gust, MD;  Location: [facility];  Service: ENT;  Laterality: Right;    Social History   Tobacco Use  . Smoking status: Former Smoker    Packs/day: 1.50    Years: 44.00    Pack years: 66.00    Types: Cigarettes    Last attempt to quit: 06/24/1996    Years since quitting: 22.0  . Smokeless tobacco: Never Used  . Tobacco comment: smoking cessation materials not required  Substance Use Topics  . Alcohol use: Yes    Alcohol/week: 0.0 standard drinks    Comment: rare  . Drug use: No     Medication list has been reviewed and updated.  Current Meds  Medication Sig  . acetaminophen (TYLENOL) 500 MG tablet Take 1,000 mg every 6 (six) hours as needed by mouth for moderate pain or headache.  Marland Kitchen aspirin EC 81 MG EC tablet Take 1 tablet (81 mg total) by mouth daily.  Marland Kitchen atenolol (TENORMIN) 50 MG tablet TAKE 1 TABLET BY MOUTH ONCE DAILY  . atorvastatin (LIPITOR) 40 MG tablet Take 1 tablet (40 mg total) daily at 6 PM by mouth.  . Blood Glucose Monitoring Suppl (FIFTY50 GLUCOSE METER 2.0) w/Device KIT 1 Device by Other route 2 (two) times daily.  . clopidogrel (PLAVIX) 75 MG tablet Take 75 mg by mouth daily.  Marland Kitchen glucose blood (RELION PRIME TEST) test strip 1 strip by Other route 2 (two) times daily.  . insulin NPH Human (HUMULIN N,NOVOLIN N) 100 UNIT/ML  injection Inject 0.25 mLs (25 Units total) into the skin 2 (two) times daily. (Patient taking differently: Inject 30 Units into the skin 2 (two) times daily. )  . insulin regular (NOVOLIN R,HUMULIN R) 100 units/mL injection Inject 14 Units into the skin 2 (two) times daily. 15 units in the morning & 10 units in the evening with supper  . levothyroxine (SYNTHROID, LEVOTHROID) 300 MCG tablet Take 300 mcg by mouth daily before breakfast. 1/2 pill once a week, 1qday other days  . sodium bicarbonate 650 MG tablet Take 650 mg by mouth 2 (two) times daily.  . tamsulosin (FLOMAX) 0.4 MG CAPS capsule Take 1 capsule (0.4 mg total) by mouth daily.    PHQ 2/9 Scores 06/29/2018 06/22/2018 11/17/2017 09/10/2017  PHQ - 2 Score 0 0 0 0  PHQ- 9 Score 0 0 - -    Physical Exam  Constitutional: He is oriented to person, place, and time. He appears well-developed and well-nourished.  HENT:  Head: Normocephalic.  Right Ear: Tympanic membrane, external ear and ear canal normal.  Left Ear: Tympanic membrane, external ear and ear canal normal.  Nose: Nose normal.  Mouth/Throat: Uvula is midline and oropharynx is clear and moist.  Eyes: Pupils are equal, round, and reactive to light. Conjunctivae and EOM are normal.  Neck: Normal range of motion. Neck supple. Carotid bruit is not present. No thyromegaly present.  Cardiovascular: Normal rate, regular rhythm, normal heart sounds and intact distal pulses.  Pulmonary/Chest: Effort normal and breath sounds normal. He has no wheezes. Right breast exhibits no mass. Left breast exhibits no mass.  Abdominal: Soft. Normal appearance and bowel sounds are normal. There is no hepatosplenomegaly. There is no tenderness.  Musculoskeletal: Normal range of motion.  Lymphadenopathy:    He has no cervical adenopathy.  Neurological: He is alert and oriented to person, place, and time. He has normal reflexes.  Skin: Skin is warm, dry and intact.  Psychiatric: He has a normal mood and  affect. His speech is normal and behavior is normal. Judgment and thought content normal.  Nursing note and vitals reviewed.   BP 138/62   Pulse 64   Ht 6' 2"  (1.88 m)   Wt 213 lb (96.6 kg)   BMI 27.35 kg/m   Assessment and Plan: 1. Annual physical exam Encouraged reduced calorie diet, regular exercise Vaccines are up to date  2. 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 Essential hypertension controlled  4. Hypertension in stage 4 chronic kidney disease due to type 2 diabetes mellitus (H[facility]) GFR worse; continue nephrology follow up  5. Hyperlipidemia associated with type 2 diabetes mellitus ([place]) On statin therapy  6. Adult hypothyroidism Supplemented, dose reduced slightly after last labs   No orders of the defined types were placed in this encounter.   Partially dictated using Editor, commissioning. Any errors are unintentional.  Halina Maidens, MD [facility]  06/29/2018

## 2018-07-28 DIAGNOSIS — E875 Hyperkalemia: Secondary | ICD-10-CM | POA: Diagnosis not present

## 2018-07-28 DIAGNOSIS — I120 Hypertensive chronic kidney disease with stage 5 chronic kidney disease or end stage renal disease: Secondary | ICD-10-CM | POA: Diagnosis not present

## 2018-07-28 DIAGNOSIS — N185 Chronic kidney disease, stage 5: Secondary | ICD-10-CM | POA: Diagnosis not present

## 2018-07-28 DIAGNOSIS — E872 Acidosis: Secondary | ICD-10-CM | POA: Diagnosis not present

## 2018-07-28 DIAGNOSIS — M1A9XX0 Chronic gout, unspecified, without tophus (tophi): Secondary | ICD-10-CM | POA: Diagnosis not present

## 2018-07-28 DIAGNOSIS — N2581 Secondary hyperparathyroidism of renal origin: Secondary | ICD-10-CM | POA: Diagnosis not present

## 2018-08-03 DIAGNOSIS — H2513 Age-related nuclear cataract, bilateral: Secondary | ICD-10-CM | POA: Diagnosis not present

## 2018-08-03 DIAGNOSIS — E113293 Type 2 diabetes mellitus with mild nonproliferative diabetic retinopathy without macular edema, bilateral: Secondary | ICD-10-CM | POA: Diagnosis not present

## 2018-08-15 ENCOUNTER — Other Ambulatory Visit: Payer: Self-pay | Admitting: Internal Medicine

## 2018-08-15 DIAGNOSIS — I2510 Atherosclerotic heart disease of native coronary artery without angina pectoris: Secondary | ICD-10-CM

## 2018-08-17 DIAGNOSIS — I25718 Atherosclerosis of autologous vein coronary artery bypass graft(s) with other forms of angina pectoris: Secondary | ICD-10-CM | POA: Diagnosis not present

## 2018-08-17 DIAGNOSIS — E1159 Type 2 diabetes mellitus with other circulatory complications: Secondary | ICD-10-CM | POA: Diagnosis not present

## 2018-08-17 DIAGNOSIS — I10 Essential (primary) hypertension: Secondary | ICD-10-CM | POA: Diagnosis not present

## 2018-08-17 DIAGNOSIS — I25118 Atherosclerotic heart disease of native coronary artery with other forms of angina pectoris: Secondary | ICD-10-CM | POA: Diagnosis not present

## 2018-08-23 ENCOUNTER — Other Ambulatory Visit: Payer: Self-pay | Admitting: Internal Medicine

## 2018-08-23 DIAGNOSIS — I2510 Atherosclerotic heart disease of native coronary artery without angina pectoris: Secondary | ICD-10-CM

## 2018-08-24 IMAGING — CT CT ABD-PELV W/O CM
2 of 4 series · 14 of 46 positions shown, 16 images · non-contrast
Comparison: Abdominal CT dated 04/21/2016

CLINICAL DATA: 76-year-old male with abdominal pain, nausea and
acute renal failure.

EXAM:
CT ABDOMEN AND PELVIS WITHOUT CONTRAST
TECHNIQUE: Multidetector CT imaging of the abdomen and pelvis was performed
following the standard protocol without IV contrast.

[Series 2: routine abd/pel wo · axial · 0.85mm/px · z∈[-548,-58]mm · 11 of 108 slices shown, 13 images]
[im 5/108  soft-tissue]
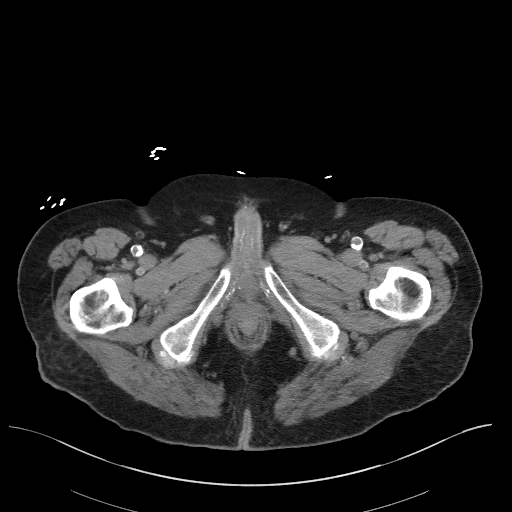
[im 5/108  bone]
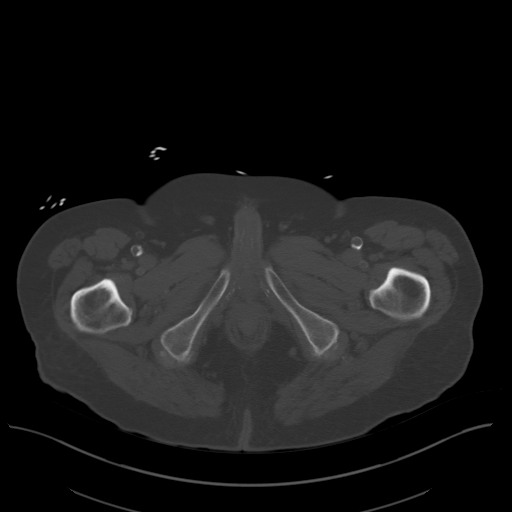
[im 15/108  soft-tissue]
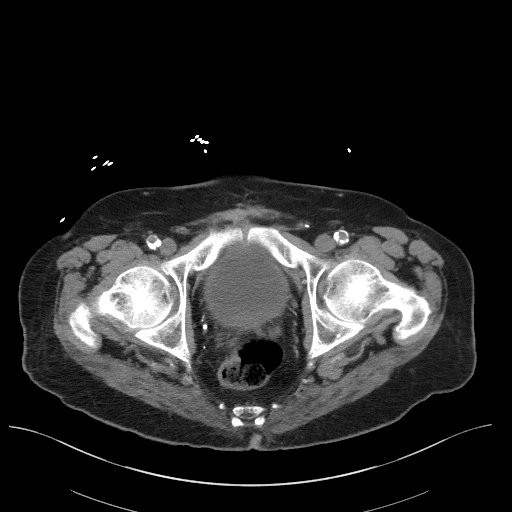
[im 25/108  soft-tissue]
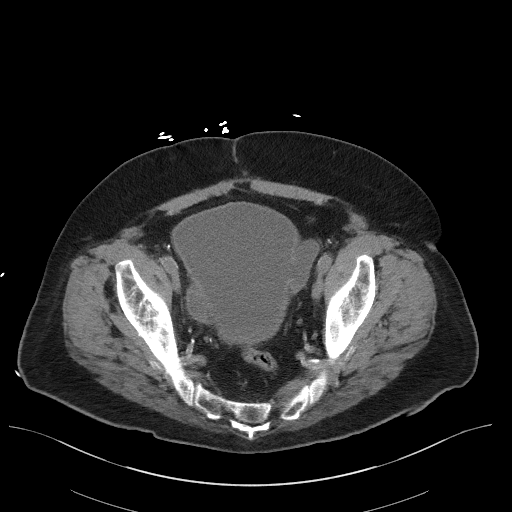
[im 35/108  soft-tissue]
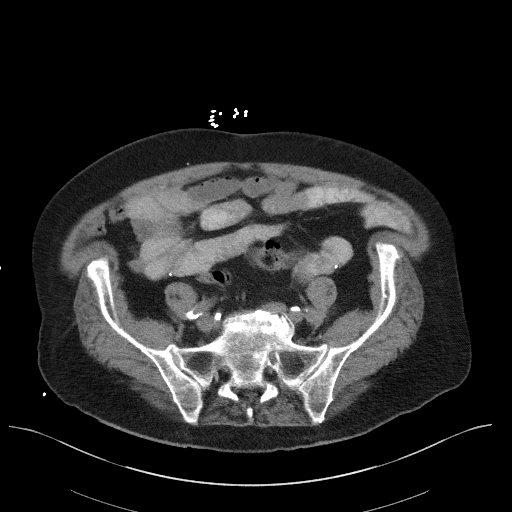
[im 44/108  soft-tissue]
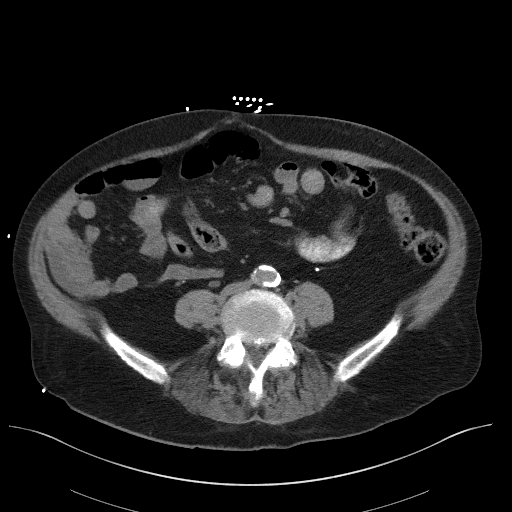
[im 54/108  soft-tissue]
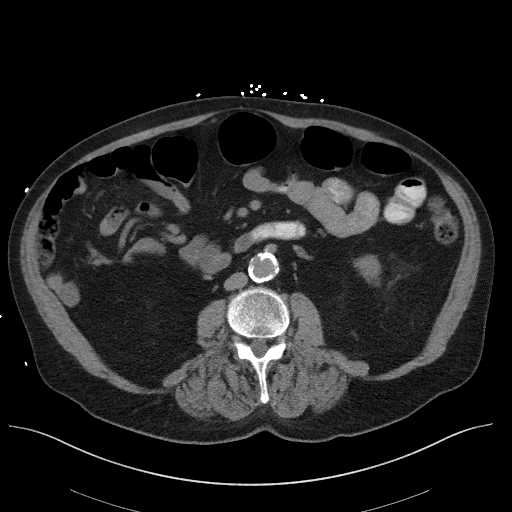
[im 64/108  soft-tissue]
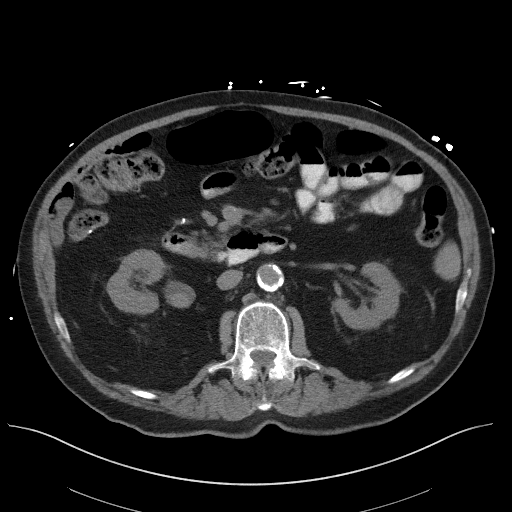
[im 73/108  soft-tissue]
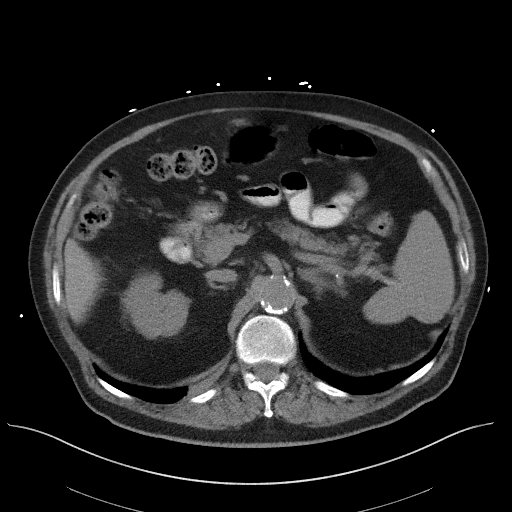
[im 83/108  soft-tissue]
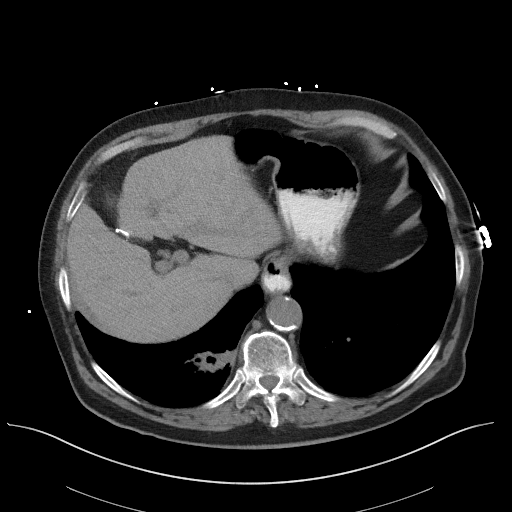
[im 83/108  bone]
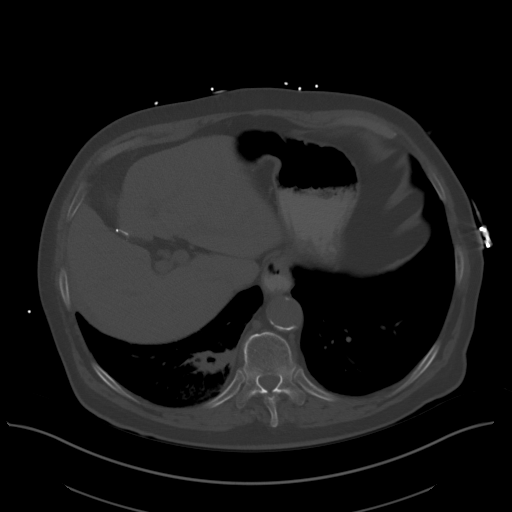
[im 93/108  soft-tissue]
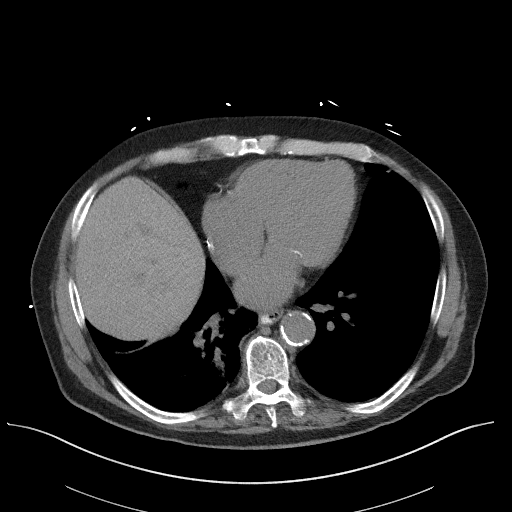
[im 103/108  soft-tissue]
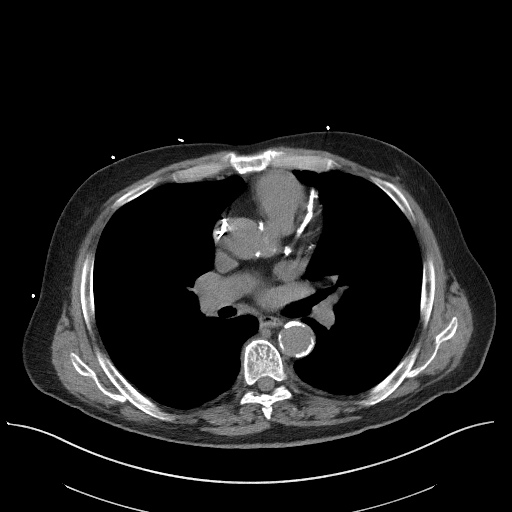

[Series 5: coronal st · coronal · 0.80mm/px · 3 of 107 slices shown]
[im 36/107  soft-tissue]
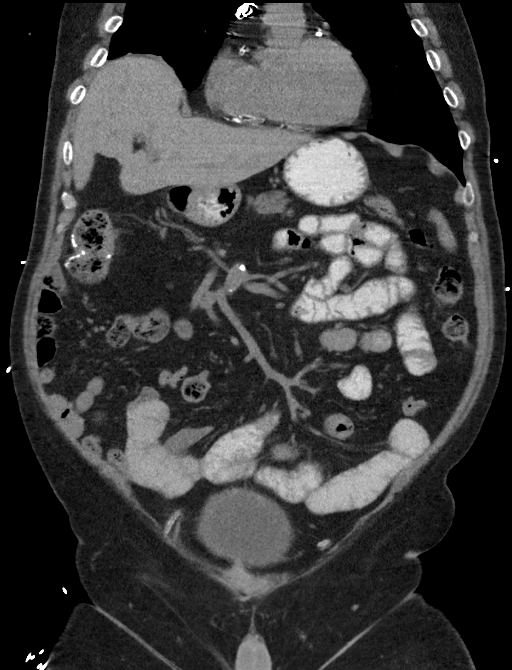
[im 48/107  soft-tissue]
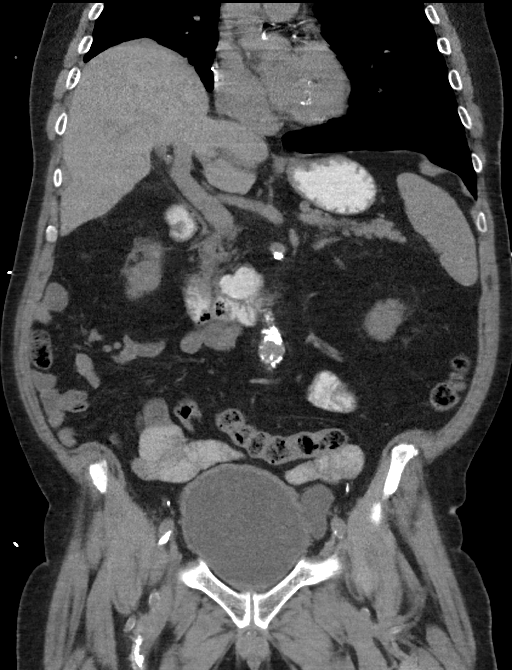
[im 59/107  soft-tissue]
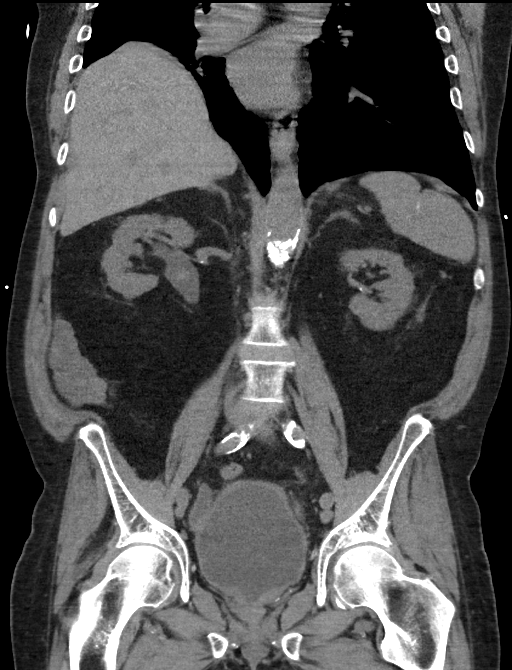

[14 of 46 positions shown; findings below may reference images not displayed]

FINDINGS: Evaluation of this exam is limited in the absence of intravenous
contrast.

Lower chest: There is a 2.5 x 4.5 cm focal consolidative change at
the right lung base with nodularity of the adjacent parenchyma. This
may represent an area of pneumonia possibly related to aspiration.
Less likely this may represent an area of pulmonary contusion given
adjacent vertebral body fracture. Underlying mass or a neoplastic
process is not excluded. Clinical correlation and follow-up to
resolution recommended. There is advanced multi vessel coronary
vascular calcification and CABG surgery.

No intra-abdominal free air or free fluid.

Hepatobiliary: The liver is unremarkable. Cholecystectomy with mild
dilatation of the central CBD, likely post cholecystectomy.

Pancreas: Unremarkable. No pancreatic ductal dilatation or
surrounding inflammatory changes.

Spleen: Normal in size without focal abnormality.

Adrenals/Urinary Tract: The adrenal glands are unremarkable. There
is a 14 x 11 x 19 mm lesion along the posterior wall of the right
renal pelvis most consistent with a neoplasm, likely a transitional
cell carcinoma. Further evaluation with direct visualization and
ureteroscopy recommended. There is no hydronephrosis or
nephrolithiasis on either side. The visualized ureters appear
unremarkable. There is trabeculated appearance of the bladder wall
with bilateral bladder wall diverticulum measuring up to 5 cm on the
left.

Stomach/Bowel: There are scattered colonic diverticula without
active inflammatory changes. This postsurgical changes of bowel
surgery with ileocolonic anastomosis in the right upper quadrant. No
evidence of bowel obstruction or active inflammation. Appendectomy.
There is a small hiatal hernia with gastroesophageal reflux. There
is a 4 cm distal duodenal diverticulum without active inflammatory
changes.

Vascular/Lymphatic: There is advanced aortoiliac atherosclerotic
disease. There is a 3 cm infrarenal abdominal aortic aneurysm. There
is mild narrowing of the infrarenal aorta secondary to advanced
atherosclerotic disease. No portal venous gas identified. No
adenopathy.

Reproductive: The prostate gland is not visualized, likely
surgically absent.

Other: Midline vertical anterior abdominal wall incisional scar. No
fluid collection.

Musculoskeletal: Osteopenia with degenerative changes of the spine.
There is T12 compression fracture with approximately 50% loss of
vertebral body height. This fracture appears acute. There is mild
buckling of the superior posterior cortex of the T12 vertebra. No
significant narrowing of the central canal. If there is neurological
deficit or clinical signs of cord compression, MRI is recommended
for further evaluation. There is small amount of hematoma in the
adjacent perivertebral soft tissues. Median sternotomy wires noted.
IMPRESSION: 1. Rounded lesion along the posterior wall of the right renal pelvis
highly concerning for a neoplastic process such as TCC. Further
evaluation with direct visualization and ureteroscopy recommended.
2. Acute T12 compression fracture with approximately 50% loss of
vertebral body height and minimal buckling of the superior posterior
cortex. No significant central canal narrowing. If there is clinical
concern for cord compression, further evaluation with MRI
recommended.
3. Focal area of consolidative change and adjacent parenchymal
nodularity in the right lower lobe most likely representing
pneumonia possibly related to aspiration. Clinical correlation is
recommended.
4. Colonic and duodenal diverticulum without active inflammatory
changes. No bowel obstruction or active inflammation.
5. Trabeculated bladder wall with bladder diverticula.
6.  Aortic Atherosclerosis ([id number]).
7. A 3 cm infrarenal abdominal aortic aneurysm. Recommend followup
by ultrasound in 3 years. This recommendation follows ACR consensus
guidelines: White Paper of the ACR Incidental Findings Committee II

## 2018-08-30 ENCOUNTER — Emergency Department
Admission: EM | Admit: 2018-08-30 | Discharge: 2018-08-30 | Disposition: A | Discharge status: Home / Self Care | Payer: PPO | Attending: Emergency Medicine | Admitting: Emergency Medicine

## 2018-08-30 ENCOUNTER — Other Ambulatory Visit: Payer: Self-pay

## 2018-08-30 DIAGNOSIS — N184 Chronic kidney disease, stage 4 (severe): Secondary | ICD-10-CM | POA: Insufficient documentation

## 2018-08-30 DIAGNOSIS — E1140 Type 2 diabetes mellitus with diabetic neuropathy, unspecified: Secondary | ICD-10-CM | POA: Insufficient documentation

## 2018-08-30 DIAGNOSIS — K922 Gastrointestinal hemorrhage, unspecified: Secondary | ICD-10-CM | POA: Diagnosis not present

## 2018-08-30 DIAGNOSIS — Z7982 Long term (current) use of aspirin: Secondary | ICD-10-CM | POA: Insufficient documentation

## 2018-08-30 DIAGNOSIS — K625 Hemorrhage of anus and rectum: Secondary | ICD-10-CM | POA: Diagnosis present

## 2018-08-30 DIAGNOSIS — Z87891 Personal history of nicotine dependence: Secondary | ICD-10-CM | POA: Insufficient documentation

## 2018-08-30 DIAGNOSIS — Z85038 Personal history of other malignant neoplasm of large intestine: Secondary | ICD-10-CM | POA: Diagnosis not present

## 2018-08-30 DIAGNOSIS — E1122 Type 2 diabetes mellitus with diabetic chronic kidney disease: Secondary | ICD-10-CM | POA: Insufficient documentation

## 2018-08-30 DIAGNOSIS — I2510 Atherosclerotic heart disease of native coronary artery without angina pectoris: Secondary | ICD-10-CM | POA: Insufficient documentation

## 2018-08-30 DIAGNOSIS — Z7902 Long term (current) use of antithrombotics/antiplatelets: Secondary | ICD-10-CM | POA: Insufficient documentation

## 2018-08-30 DIAGNOSIS — Z794 Long term (current) use of insulin: Secondary | ICD-10-CM | POA: Diagnosis not present

## 2018-08-30 DIAGNOSIS — I252 Old myocardial infarction: Secondary | ICD-10-CM | POA: Diagnosis not present

## 2018-08-30 DIAGNOSIS — I129 Hypertensive chronic kidney disease with stage 1 through stage 4 chronic kidney disease, or unspecified chronic kidney disease: Secondary | ICD-10-CM | POA: Diagnosis not present

## 2018-08-30 DIAGNOSIS — Z79899 Other long term (current) drug therapy: Secondary | ICD-10-CM | POA: Diagnosis not present

## 2018-08-30 DIAGNOSIS — Z8673 Personal history of transient ischemic attack (TIA), and cerebral infarction without residual deficits: Secondary | ICD-10-CM | POA: Insufficient documentation

## 2018-08-30 LAB — CBC
HCT: 34.6 % — ABNORMAL LOW (ref 39.0–52.0)
HEMOGLOBIN: 11.3 g/dL — AB (ref 13.0–17.0)
MCH: 32.7 pg (ref 26.0–34.0)
MCHC: 32.7 g/dL (ref 30.0–36.0)
MCV: 100 fL (ref 80.0–100.0)
Platelets: 153 10*3/uL (ref 150–400)
RBC: 3.46 MIL/uL — AB (ref 4.22–5.81)
RDW: 13.5 % (ref 11.5–15.5)
WBC: 11.6 10*3/uL — AB (ref 4.0–10.5)
nRBC: 0 % (ref 0.0–0.2)

## 2018-08-30 LAB — COMPREHENSIVE METABOLIC PANEL
ALT: 21 U/L (ref 0–44)
AST: 18 U/L (ref 15–41)
Albumin: 3.1 g/dL — ABNORMAL LOW (ref 3.5–5.0)
Alkaline Phosphatase: 90 U/L (ref 38–126)
Anion gap: 7 (ref 5–15)
BILIRUBIN TOTAL: 0.5 mg/dL (ref 0.3–1.2)
BUN: 57 mg/dL — AB (ref 8–23)
CO2: 19 mmol/L — ABNORMAL LOW (ref 22–32)
Calcium: 8.4 mg/dL — ABNORMAL LOW (ref 8.9–10.3)
Chloride: 111 mmol/L (ref 98–111)
Creatinine, Ser: 3.91 mg/dL — ABNORMAL HIGH (ref 0.61–1.24)
GFR, EST AFRICAN AMERICAN: 16 mL/min — AB (ref >60)
GFR, EST NON AFRICAN AMERICAN: 13 mL/min — AB (ref >60)
Glucose, Bld: 213 mg/dL — ABNORMAL HIGH (ref 70–99)
POTASSIUM: 5.9 mmol/L — AB (ref 3.5–5.1)
Sodium: 137 mmol/L (ref 135–145)
TOTAL PROTEIN: 6.4 g/dL — AB (ref 6.5–8.1)

## 2018-08-30 LAB — HEMOGLOBIN AND HEMATOCRIT, BLOOD
HEMATOCRIT: 36.5 % — AB (ref 39.0–52.0)
Hemoglobin: 11.9 g/dL — ABNORMAL LOW (ref 13.0–17.0)

## 2018-08-30 LAB — TYPE AND SCREEN
ABO/RH(D): O POS
ANTIBODY SCREEN: NEGATIVE

## 2018-08-30 NOTE — Discharge Instructions (Addendum)
As we discussed it is very important that you return if you were to have any further bloody bowel movements. Please seek medical attention for any high fevers, chest pain, shortness of breath, change in behavior, persistent vomiting, bloody stool or any other new or concerning symptoms.

## 2018-08-30 NOTE — ED Triage Notes (Signed)
Pt c/o having 1 large loose dark red bloody stool today. Denies any abd pain or other sx. States he does take plavix.

## 2018-08-30 NOTE — ED Provider Notes (Signed)
[facility] Emergency Department Provider Note   ____________________________________________   I have reviewed the triage vital signs and the nursing notes.   HISTORY  Chief Complaint GI Bleeding   History limited by: Not Limited   HPI [name] is a 77 y.o. male who presents to the emergency department today because of concerns for an episode of rectal bleeding.  The patient stated that he had an episode of diarrhea earlier today.  He then took some Imodium.  States shortly after that he had another bowel movement however this was primarily red and blood like.  He has not had any further episodes.  He denies any significant pain although had a slight discomfort to the left side of his abdomen today.  He denies any unusual ingestions recently.  Denies similar symptoms in the past.  States last colonoscopy was 10 years ago and he does not recall any abnormalities.   Per medical record review patient has a history of being on plavix.   Past Medical History:  Diagnosis Date  . Blood transfusion without reported diagnosis    patient unaware of receiving blood unless it was during surgery and he was not told  . Cancer [facility]) 2001   Colon resection  . CAP (community acquired pneumonia) 08/11/2017  . Carotid stenosis, symptomatic, with infarction ([place]) 07/17/2017  . Chronic kidney disease 11/2017   stage IV CKD per nephrologist.  . Complication of anesthesia    raspy voice since carotid endarterectomy 07/17/17. paralysis of vocal chords  . Coronary artery disease   . Diabetes mellitus without complication ([place])   . Dysrhythmia    patient unaware of any irregular heart rhythms  . Hyperlipidemia   . Hypertension   . Hypothyroidism   . Kidney mass 2019  . Myocardial infarction ([place]) 1997  . Peripheral vascular disease ([place])   . Pneumonia 2014, 2018   developed after surgery 2018  . Stroke ([place]) 07/2017   mild stroke and then had carotid endarterectomy   . Thyroid disease   . Weight loss 2019   patient has lost over 60 pounds since 07/2017.     Patient Active Problem List   Diagnosis Date Noted  . Hypertension 03/27/2018  . Hematuria 11/14/2017  . Urinary retention 08/28/2017  . Bilateral carotid artery stenosis 08/01/2017  . Hoarseness of voice 08/01/2017  . History of compression fracture of spine 07/28/2017  . History of stroke 06/11/2017  . Chronic shoulder bursitis, left 07/22/2016  . Cholecystitis, acute 04/21/2016  . 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 CAD in native artery 01/20/2015  . Gout 01/20/2015  . Diabetic peripheral neuropathy ([place]) 01/20/2015  . H/O malignant neoplasm of colon 01/20/2015  . Adult hypothyroidism 01/20/2015  . Hyperlipidemia associated with type 2 diabetes mellitus ([place]) 01/20/2015  . High potassium 05/19/2014  . 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 Hypertension in stage 4 chronic kidney disease due to type 2 diabetes mellitus ([place]) 07/15/2013    Past Surgical History:  Procedure Laterality Date  . APPENDECTOMY  2011  . BACK SURGERY    . CARDIAC CATHETERIZATION  1997  . CAROTID ANGIOGRAPHY Right 06/13/2017   Procedure: Right subclavian and Carotid Angiography, possible intervention;  Surgeon: Algernon Huxley, MD;  Location: [facility];  Service: Cardiovascular;  Laterality: Right;  . CHOLECYSTECTOMY N/A 04/23/2016  had infection post surgery requiring him to debride daily  . 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 CORONARY ARTERY BYPASS GRAFT  1997   x 3  . CYSTOSCOPY W/ RETROGRADES Bilateral 09/03/2017   Procedure: CYSTOSCOPY WITH RETROGRADE PYELOGRAM;  Surgeon: Hollice Espy, MD;  Location: [facility];  Service: Urology;  Laterality: Bilateral;  . CYSTOSCOPY W/  RETROGRADES Right 01/28/2018   Procedure: CYSTOSCOPY WITH RETROGRADE PYELOGRAM;  Surgeon: Hollice Espy, MD;  Location: [facility];  Service: Urology;  Laterality: Right;  . CYSTOSCOPY W/ URETERAL STENT REMOVAL  08/2017  . CYSTOSCOPY WITH BIOPSY Right 01/28/2018   Procedure: CYSTOSCOPY WITH BIOPSY;  Surgeon: Hollice Espy, MD;  Location: [facility];  Service: Urology;  Laterality: Right;  . CYSTOSCOPY WITH STENT PLACEMENT Right 09/03/2017   Procedure: CYSTOSCOPY WITH STENT PLACEMENT;  Surgeon: Hollice Espy, MD;  Location: [facility];  Service: Urology;  Laterality: Right;  . CYSTOSCOPY WITH STENT PLACEMENT Right 01/28/2018   Procedure: CYSTOSCOPY WITH STENT PLACEMENT;  Surgeon: Hollice Espy, MD;  Location: [facility];  Service: Urology;  Laterality: Right;  . CYSTOSCOPY WITH URETEROSCOPY Right 01/28/2018   Procedure: CYSTOSCOPY WITH URETEROSCOPY;  Surgeon: Hollice Espy, MD;  Location: [facility];  Service: Urology;  Laterality: Right;  . ENDARTERECTOMY Right 07/17/2017   Procedure: ENDARTERECTOMY CAROTID;  Surgeon: Algernon Huxley, MD;  Location: [facility];  Service: Vascular;  Laterality: Right;  . HERNIA REPAIR  2011   Ventral hernia  . HOLMIUM LASER APPLICATION N/A 65/[id number]/4696   Procedure: HOLMIUM LASER APPLICATION;  Surgeon: Hollice Espy, MD;  Location: [facility];  Service: Urology;  Laterality: N/A;  . KNEE SURGERY Left    arthroscopy  . KYPHOPLASTY N/A 08/14/2017   Procedure: [id number];  Surgeon: Hessie Knows, MD;  Location: [facility];  Service: Orthopedics;  Laterality: N/A;  . LARYNX SURGERY    . PROSTATE SURGERY  2002   BPH benign pathology  . SPINE SURGERY  1989   Lumbar disc  . THYROPLASTY Right 01/05/2018   Procedure: THYROPLASTY;  Surgeon: Beverly Gust, MD;  Location: [facility];  Service: ENT;  Laterality: Right;    Prior to Admission medications   Medication Sig Start Date End Date Taking? Authorizing Provider  acetaminophen (TYLENOL) 500 MG tablet Take 1,000 mg every  6 (six) hours as needed by mouth for moderate pain or headache.    [provider]  aspirin EC 81 MG EC tablet Take 1 tablet (81 mg total) by mouth daily. 07/19/17   Algernon Huxley, MD  atenolol (TENORMIN) 50 MG tablet TAKE 1 TABLET BY MOUTH ONCE DAILY 02/21/18   Glean Hess, MD  atorvastatin (LIPITOR) 40 MG tablet Take 1 tablet (40 mg total) daily at 6 PM by mouth. 08/28/17   Glean Hess, MD  Blood Glucose Monitoring Suppl (FIFTY50 GLUCOSE METER 2.0) w/Device KIT 1 Device by Other route 2 (two) times daily. 12/08/17   [provider]  calcitRIOL (ROCALTROL) 0.25 MCG capsule Take 0.25 mcg by mouth daily. 06/07/18   [provider]  clopidogrel (PLAVIX) 75 MG tablet Take 75 mg by mouth daily. 06/15/17   [provider]  clopidogrel (PLAVIX) 75 MG tablet TAKE 1 TABLET BY MOUTH ONCE DAILY 08/24/18   Glean Hess, MD  glucose blood (RELION PRIME TEST) test strip 1 strip by Other route 2 (two) times daily. 07/09/17   [provider]  insulin NPH Human (HUMULIN N,NOVOLIN N) 100 UNIT/ML injection Inject  0.25 mLs (25 Units total) into the skin 2 (two) times daily. Patient taking differently: Inject 30 Units into the skin 2 (two) times daily.  08/15/17   Vaughan Basta, MD  insulin regular (NOVOLIN R,HUMULIN R) 100 units/mL injection Inject 14 Units into the skin 2 (two) times daily. 15 units in the morning & 10 units in the evening with supper    [provider]  levothyroxine (SYNTHROID, LEVOTHROID) 300 MCG tablet Take 300 mcg by mouth daily before breakfast. 1/2 pill once a week, 1qday other days    [provider]  sodium bicarbonate 650 MG tablet Take 650 mg by mouth 2 (two) times daily. 06/19/18   [provider]  tamsulosin (FLOMAX) 0.4 MG CAPS capsule Take 1 capsule (0.4 mg total) by mouth daily. 06/23/18   Zara Council A, PA-C    Allergies Ace inhibitors and Quinapril  Family History  Problem Relation Age of  Onset  . Diabetes Mother   . Heart disease Mother   . Heart disease Father   . Cancer Father        lung    Social History Social History   Tobacco Use  . Smoking status: Former Smoker    Packs/day: 1.50    Years: 44.00    Pack years: 66.00    Types: Cigarettes    Last attempt to quit: 06/24/1996    Years since quitting: 22.1  . Smokeless tobacco: Never Used  . Tobacco comment: smoking cessation materials not required  Substance Use Topics  . Alcohol use: Yes    Alcohol/week: 0.0 standard drinks    Comment: rare  . Drug use: No    Review of Systems Constitutional: No fever/chills Eyes: No visual changes. ENT: No sore throat. Cardiovascular: Denies chest pain. Respiratory: Denies shortness of breath. Gastrointestinal: Positive for GI bleed.  Genitourinary: Negative for dysuria. Musculoskeletal: Negative for back pain. Skin: Negative for rash. Neurological: Negative for headaches, focal weakness or numbness.  ____________________________________________   PHYSICAL EXAM:  VITAL SIGNS: ED Triage Vitals  Enc Vitals Group     BP 08/30/18 1434 132/72     Pulse Rate 08/30/18 1434 77     Resp 08/30/18 1434 17     Temp 08/30/18 1434 98.7 F (37.1 C)     Temp Source 08/30/18 1434 Oral     SpO2 08/30/18 1434 99 %     Weight 08/30/18 1435 214 lb (97.1 kg)     Height 08/30/18 1435 _0  (1.88 m)     Head Circumference --      Peak Flow --      Pain Score 08/30/18 1435 0    Constitutional: Alert and oriented.  Eyes: Conjunctivae are normal.  ENT      Head: Normocephalic and atraumatic.      Nose: No congestion/rhinnorhea.      Mouth/Throat: Mucous membranes are moist.      Neck: No stridor. Hematological/Lymphatic/Immunilogical: No cervical lymphadenopathy. Cardiovascular: Normal rate, regular rhythm.  No murmurs, rubs, or gallops.  Respiratory: Normal respiratory effort without tachypnea nor retractions. Breath sounds are clear and equal bilaterally. No  wheezes/rales/rhonchi. Gastrointestinal: Soft and non tender. No rebound. No guarding.  Genitourinary: Deferred Musculoskeletal: Normal range of motion in all extremities. No lower extremity edema. Neurologic:  Normal speech and language. No gross focal neurologic deficits are appreciated.  Skin:  Skin is warm, dry and intact. No rash noted. Psychiatric: Mood and affect are normal. Speech and behavior are normal. Patient exhibits appropriate  insight and judgment.  ____________________________________________    LABS (pertinent positives/negatives)  CBC wbc 11.6, hgb 11.3, plt 153 CMP na 137, k 5.9, cr 3.91 Repeat h/h hgb 11.9, hct 36.5 ____________________________________________   EKG  None  ____________________________________________    RADIOLOGY  None  ____________________________________________   PROCEDURES  Procedures  ____________________________________________   INITIAL IMPRESSION / ASSESSMENT AND PLAN / ED COURSE  Pertinent labs & imaging results that were available during my care of the patient were reviewed by me and considered in my medical decision making (see chart for details).   Patient presented to the emergency department today because of concerns for GI bleed.  On exam patient is neither tachycardic nor hypotensive.  Rectal exam showed some brown stool on the glove which was guaiac positive.  I did have a discussion with the patient.  We decided that we would recheck hemoglobin and hematocrit.  Patient did not have any further bloody stool movements here.  His hematocrit and hemoglobin actually improved.  Did discuss going home at this point.  We also discussed admission.  Patient felt comfortable going home and I think this is completely reasonable.  We did discuss importance of returning if he had further bleeding.  Terms of his other blood work his potassium and creatinine are elevated.  Per medical record it appears that he has chronic elevation of  his potassium and this is not unusual for him. Discussed importance of PCP follow up and colonoscopy.  ____________________________________________   FINAL CLINICAL IMPRESSION(S) / ED DIAGNOSES  Final diagnoses:  Gastrointestinal hemorrhage, unspecified gastrointestinal hemorrhage type     Note: This dictation was prepared with Dragon dictation. Any transcriptional errors that result from this process are unintentional     Nance Pear, MD 08/30/18 2119

## 2018-08-30 NOTE — ED Notes (Signed)
Pt given sandwich tray and diet ginger ale.

## 2018-08-30 NOTE — ED Notes (Signed)
Call pt's daughter in demographics if anything changes.

## 2018-09-04 ENCOUNTER — Encounter: Payer: Self-pay | Admitting: Internal Medicine

## 2018-09-04 ENCOUNTER — Ambulatory Visit (INDEPENDENT_AMBULATORY_CARE_PROVIDER_SITE_OTHER): Payer: PPO | Admitting: Internal Medicine

## 2018-09-04 VITALS — BP 104/58 | HR 82 | Ht 74.0 in | Wt 217.0 lb

## 2018-09-04 DIAGNOSIS — Z85038 Personal history of other malignant neoplasm of large intestine: Secondary | ICD-10-CM

## 2018-09-04 DIAGNOSIS — K625 Hemorrhage of anus and rectum: Secondary | ICD-10-CM

## 2018-09-04 NOTE — [place] Notes (Signed)
Date:  09/04/2018   Name:  [name]   DOB:  May 30, 1941   MRN:  [id number]   Chief Complaint: Blood In Stools (Wanted to discuss with Dr Army Melia and see if he can get referral for colonoscopy. Having excessive acid reflux. AVS said to F/up with PCP and Dr Marius Ditch - GI. )  Rectal Bleeding   The current episode started more than 2 weeks ago. The onset was sudden. The problem has been resolved. The patient is experiencing no pain. The stool is described as soft. Associated symptoms include diarrhea. Pertinent negatives include no fever, no abdominal pain, no chest pain and no coughing.    Review of Systems  Constitutional: Negative for chills, fatigue and fever.  Respiratory: Negative for cough, chest tightness and shortness of breath.   Cardiovascular: Negative for chest pain.  Gastrointestinal: Positive for diarrhea and hematochezia. Negative for abdominal pain.    Patient Active Problem List   Diagnosis Date Noted  . Hypertension 03/27/2018  . Hematuria 11/14/2017  . Urinary retention 08/28/2017  . Bilateral carotid artery stenosis 08/01/2017  . Hoarseness of voice 08/01/2017  . History of compression fracture of spine 07/28/2017  . History of stroke 06/11/2017  . Chronic shoulder bursitis, left 07/22/2016  . Cholecystitis, acute 04/21/2016  . 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 CAD in native artery 01/20/2015  . Gout 01/20/2015  . Diabetic peripheral neuropathy ([place]) 01/20/2015  . H/O malignant neoplasm of colon 01/20/2015  . Adult hypothyroidism 01/20/2015  . Hyperlipidemia associated with type 2 diabetes mellitus ([place]) 01/20/2015  . High potassium 05/19/2014  . 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 Hypertension in stage 4 chronic kidney disease due to type 2 diabetes mellitus ([place])  07/15/2013    Allergies  Allergen Reactions  . Ace Inhibitors Other (See Comments)    Reaction:  Raises potassium   . Quinapril Rash and Other (See Comments)    hyperkalemia    Past Surgical History:  Procedure Laterality Date  . APPENDECTOMY  2011  . BACK SURGERY    . CARDIAC CATHETERIZATION  1997  . CAROTID ANGIOGRAPHY Right 06/13/2017   Procedure: Right subclavian and Carotid Angiography, possible intervention;  Surgeon: Algernon Huxley, MD;  Location: [facility];  Service: Cardiovascular;  Laterality: Right;  . CHOLECYSTECTOMY N/A 04/23/2016   had infection post surgery requiring him to debride daily  . 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 CORONARY ARTERY BYPASS GRAFT  1997   x 3  . CYSTOSCOPY W/ RETROGRADES Bilateral 09/03/2017   Procedure: CYSTOSCOPY WITH RETROGRADE PYELOGRAM;  Surgeon: Hollice Espy, MD;  Location: [facility];  Service: Urology;  Laterality: Bilateral;  . CYSTOSCOPY W/ RETROGRADES Right 01/28/2018   Procedure: CYSTOSCOPY WITH RETROGRADE PYELOGRAM;  Surgeon: Hollice Espy, MD;  Location: [facility];  Service: Urology;  Laterality: Right;  . CYSTOSCOPY W/ URETERAL STENT REMOVAL  08/2017  . CYSTOSCOPY WITH BIOPSY Right 01/28/2018   Procedure: CYSTOSCOPY WITH BIOPSY;  Surgeon: Hollice Espy, MD;  Location: [facility];  Service: Urology;  Laterality: Right;  . CYSTOSCOPY WITH STENT PLACEMENT Right 09/03/2017   Procedure: CYSTOSCOPY WITH STENT PLACEMENT;  Surgeon: Hollice Espy, MD;  Location: [facility];  Service: Urology;  Laterality: Right;  . CYSTOSCOPY WITH STENT PLACEMENT Right 01/28/2018   Procedure:  CYSTOSCOPY WITH STENT PLACEMENT;  Surgeon: Hollice Espy, MD;  Location: [facility];  Service: Urology;  Laterality: Right;  . CYSTOSCOPY WITH URETEROSCOPY Right 01/28/2018   Procedure: CYSTOSCOPY WITH URETEROSCOPY;  Surgeon: Hollice Espy, MD;  Location: [facility];  Service: Urology;  Laterality: Right;  .  ENDARTERECTOMY Right 07/17/2017   Procedure: ENDARTERECTOMY CAROTID;  Surgeon: Algernon Huxley, MD;  Location: [facility];  Service: Vascular;  Laterality: Right;  . HERNIA REPAIR  2011   Ventral hernia  . HOLMIUM LASER APPLICATION N/A 69/79/4801   Procedure: HOLMIUM LASER APPLICATION;  Surgeon: Hollice Espy, MD;  Location: [facility];  Service: Urology;  Laterality: N/A;  . KNEE SURGERY Left    arthroscopy  . KYPHOPLASTY N/A 08/14/2017   Procedure: [id number];  Surgeon: Hessie Knows, MD;  Location: [facility];  Service: Orthopedics;  Laterality: N/A;  . LARYNX SURGERY    . PROSTATE SURGERY  2002   BPH benign pathology  . SPINE SURGERY  1989   Lumbar disc  . THYROPLASTY Right 01/05/2018   Procedure: THYROPLASTY;  Surgeon: Beverly Gust, MD;  Location: [facility];  Service: ENT;  Laterality: Right;    Social History   Tobacco Use  . Smoking status: Former Smoker    Packs/day: 1.50    Years: 44.00    Pack years: 66.00    Types: Cigarettes    Last attempt to quit: 06/24/1996    Years since quitting: 22.2  . Smokeless tobacco: Never Used  . Tobacco comment: smoking cessation materials not required  Substance Use Topics  . Alcohol use: Yes    Alcohol/week: 0.0 standard drinks    Comment: rare  . Drug use: No     Medication list has been reviewed and updated.  Current Meds  Medication Sig  . acetaminophen (TYLENOL) 500 MG tablet Take 1,000 mg every 6 (six) hours as needed by mouth for moderate pain or headache.  Marland Kitchen aspirin EC 81 MG EC tablet Take 1 tablet (81 mg total) by mouth daily.  Marland Kitchen atenolol (TENORMIN) 50 MG tablet TAKE 1 TABLET BY MOUTH ONCE DAILY  . atorvastatin (LIPITOR) 40 MG tablet Take 1 tablet (40 mg total) daily at 6 PM by mouth.  . Blood Glucose Monitoring Suppl (FIFTY50 GLUCOSE METER 2.0) w/Device KIT 1 Device by Other route 2 (two) times daily.  . calcitRIOL (ROCALTROL) 0.25 MCG capsule Take 0.25 mcg by mouth daily.  . clopidogrel (PLAVIX) 75 MG tablet Take 75  mg by mouth daily.  . clopidogrel (PLAVIX) 75 MG tablet TAKE 1 TABLET BY MOUTH ONCE DAILY  . glucose blood (RELION PRIME TEST) test strip 1 strip by Other route 2 (two) times daily.  . insulin NPH Human (HUMULIN N,NOVOLIN N) 100 UNIT/ML injection Inject 0.25 mLs (25 Units total) into the skin 2 (two) times daily. (Patient taking differently: Inject 30 Units into the skin 2 (two) times daily. )  . insulin regular (NOVOLIN R,HUMULIN R) 100 units/mL injection Inject 14 Units into the skin 2 (two) times daily. 15 units in the morning & 10 units in the evening with supper  . levothyroxine (SYNTHROID, LEVOTHROID) 300 MCG tablet Take 300 mcg by mouth daily before breakfast. 1/2 pill once a week, 1qday other days  . sodium bicarbonate 650 MG tablet Take 650 mg by mouth 2 (two) times daily.  . tamsulosin (FLOMAX) 0.4 MG CAPS capsule Take 1 capsule (0.4 mg total) by mouth daily.    PHQ 2/9 Scores 06/29/2018 06/22/2018 11/17/2017 09/10/2017  PHQ - 2  Score 0 0 0 0  PHQ- 9 Score 0 0 - -    Physical Exam  Constitutional: He appears well-developed and well-nourished. No distress.  Neck: Normal range of motion. Neck supple.  Cardiovascular: Normal rate, regular rhythm and normal heart sounds.  Pulmonary/Chest: Effort normal and breath sounds normal.  Abdominal: Soft. Bowel sounds are normal. There is no tenderness.  Musculoskeletal: He exhibits no edema.  Skin: Skin is warm and dry.    BP (!) 104/58 (BP Location: Right Arm, Patient Position: Sitting, Cuff Size: Large)   Pulse 82   Ht 6' 2"  (1.88 m)   Wt 217 lb (98.4 kg)   SpO2 97%   BMI 27.86 kg/m   Assessment and Plan: 1. H/O malignant neoplasm of colon Has not had any followup for unclear reasons - Ambulatory referral to Gastroenterology  2. Rectal bleeding - Ambulatory referral to Gastroenterology   Partially dictated using [place]. Any errors are unintentional.  Halina Maidens, MD [facility]  09/04/2018

## 2018-09-04 NOTE — [place] Notes (Deleted)
blo

## 2018-09-14 DIAGNOSIS — H25012 Cortical age-related cataract, left eye: Secondary | ICD-10-CM | POA: Diagnosis not present

## 2018-09-14 DIAGNOSIS — H2513 Age-related nuclear cataract, bilateral: Secondary | ICD-10-CM | POA: Diagnosis not present

## 2018-09-14 DIAGNOSIS — E113492 Type 2 diabetes mellitus with severe nonproliferative diabetic retinopathy without macular edema, left eye: Secondary | ICD-10-CM | POA: Diagnosis not present

## 2018-09-14 DIAGNOSIS — H35033 Hypertensive retinopathy, bilateral: Secondary | ICD-10-CM | POA: Diagnosis not present

## 2018-09-14 DIAGNOSIS — E113411 Type 2 diabetes mellitus with severe nonproliferative diabetic retinopathy with macular edema, right eye: Secondary | ICD-10-CM | POA: Diagnosis not present

## 2018-09-14 DIAGNOSIS — H25013 Cortical age-related cataract, bilateral: Secondary | ICD-10-CM | POA: Diagnosis not present

## 2018-09-14 DIAGNOSIS — H2512 Age-related nuclear cataract, left eye: Secondary | ICD-10-CM | POA: Diagnosis not present

## 2018-09-21 ENCOUNTER — Encounter (INDEPENDENT_AMBULATORY_CARE_PROVIDER_SITE_OTHER): Payer: PPO | Admitting: Ophthalmology

## 2018-09-21 DIAGNOSIS — E11311 Type 2 diabetes mellitus with unspecified diabetic retinopathy with macular edema: Secondary | ICD-10-CM

## 2018-09-21 DIAGNOSIS — E113392 Type 2 diabetes mellitus with moderate nonproliferative diabetic retinopathy without macular edema, left eye: Secondary | ICD-10-CM | POA: Diagnosis not present

## 2018-09-21 DIAGNOSIS — H35033 Hypertensive retinopathy, bilateral: Secondary | ICD-10-CM | POA: Diagnosis not present

## 2018-09-21 DIAGNOSIS — H2513 Age-related nuclear cataract, bilateral: Secondary | ICD-10-CM

## 2018-09-21 DIAGNOSIS — H43813 Vitreous degeneration, bilateral: Secondary | ICD-10-CM

## 2018-09-21 DIAGNOSIS — I10 Essential (primary) hypertension: Secondary | ICD-10-CM

## 2018-09-21 DIAGNOSIS — E113311 Type 2 diabetes mellitus with moderate nonproliferative diabetic retinopathy with macular edema, right eye: Secondary | ICD-10-CM

## 2018-09-22 DIAGNOSIS — H25812 Combined forms of age-related cataract, left eye: Secondary | ICD-10-CM | POA: Diagnosis not present

## 2018-09-22 DIAGNOSIS — H2512 Age-related nuclear cataract, left eye: Secondary | ICD-10-CM | POA: Diagnosis not present

## 2018-09-28 DIAGNOSIS — E1142 Type 2 diabetes mellitus with diabetic polyneuropathy: Secondary | ICD-10-CM | POA: Diagnosis not present

## 2018-09-28 DIAGNOSIS — E038 Other specified hypothyroidism: Secondary | ICD-10-CM | POA: Diagnosis not present

## 2018-09-28 DIAGNOSIS — Z794 Long term (current) use of insulin: Secondary | ICD-10-CM | POA: Diagnosis not present

## 2018-09-28 DIAGNOSIS — E063 Autoimmune thyroiditis: Secondary | ICD-10-CM | POA: Diagnosis not present

## 2018-09-29 DIAGNOSIS — H2512 Age-related nuclear cataract, left eye: Secondary | ICD-10-CM | POA: Diagnosis not present

## 2018-10-01 ENCOUNTER — Ambulatory Visit (INDEPENDENT_AMBULATORY_CARE_PROVIDER_SITE_OTHER): Payer: PPO | Admitting: Gastroenterology

## 2018-10-01 ENCOUNTER — Other Ambulatory Visit: Payer: Self-pay

## 2018-10-01 ENCOUNTER — Encounter: Payer: Self-pay | Admitting: Gastroenterology

## 2018-10-01 VITALS — BP 130/61 | HR 76 | Ht 74.0 in | Wt 223.0 lb

## 2018-10-01 DIAGNOSIS — K921 Melena: Secondary | ICD-10-CM

## 2018-10-01 DIAGNOSIS — Z85038 Personal history of other malignant neoplasm of large intestine: Secondary | ICD-10-CM

## 2018-10-01 MED ORDER — NA SULFATE-K SULFATE-MG SULF 17.5-3.13-1.6 GM/177ML PO SOLN: 1.0000 | ORAL | 0 refills | Status: DC

## 2018-10-01 NOTE — Progress Notes (Signed)
Gastroenterology Consultation  Referring Provider:     Glean Hess, MD Primary Care Physician:  Glean Hess, MD Primary Gastroenterologist:  Dr. Allen Norris     Reason for Consultation:     Rectal bleeding with a history of colon cancer        HPI:   [name] is a 77 y.o. y/o male referred for consultation & management of rectal bleeding with a history of colon cancer by Dr. Army Melia, Jesse Sans, MD.  This patient comes in today after having a history of colon cancer.  The patient had a colonoscopy in 2011 by Dr. Sula Rumple.  At that time the patient underwent a right hemicolectomy.  The patient was in the emergency room for rectal bleeding in November of this year and reports that the rectal bleeding was associated with 1 day of diarrhea.  The patient had some left-sided abdominal pain around the time of the ER visit.  The patient has also reported increased heartburn recently.  The patient's hemoglobin in the emergency room was 11.9 which appears to be around his baseline.  The patient also reports that he has intermittent heartburn.  He usually takes Tums in the middle the night when he has the heartburn episodes and it is quickly relieved.  He is not reporting any dysphasia black stools or bloody stools associated with the heartburn.  The patient states he has not had any symptoms of heartburn for the last week.  Also denies he has had no further episodes of rectal bleeding since going to the emergency room.   Past Medical History:  Diagnosis Date  . Blood transfusion without reported diagnosis    patient unaware of receiving blood unless it was during surgery and he was not told  . Cancer [facility]) 2001   Colon resection  . CAP (community acquired pneumonia) 08/11/2017  . Carotid stenosis, symptomatic, with infarction ([place]) 07/17/2017  . Chronic kidney disease 11/2017   stage IV CKD per nephrologist.  . Complication of anesthesia    raspy voice since carotid endarterectomy 07/17/17.  paralysis of vocal chords  . Coronary artery disease   . Diabetes mellitus without complication ([place])   . Dysrhythmia    patient unaware of any irregular heart rhythms  . Hyperlipidemia   . Hypertension   . Hypothyroidism   . Kidney mass 2019  . Myocardial infarction ([place]) 1997  . Peripheral vascular disease ([place])   . Pneumonia 2014, 2018   developed after surgery 2018  . Stroke ([place]) 07/2017   mild stroke and then had carotid endarterectomy  . Thyroid disease   . Weight loss 2019   patient has lost over 60 pounds since 07/2017.     Past Surgical History:  Procedure Laterality Date  . APPENDECTOMY  2011  . BACK SURGERY    . CARDIAC CATHETERIZATION  1997  . CAROTID ANGIOGRAPHY Right 06/13/2017   Procedure: Right subclavian and Carotid Angiography, possible intervention;  Surgeon: Algernon Huxley, MD;  Location: [facility];  Service: Cardiovascular;  Laterality: Right;  . CHOLECYSTECTOMY N/A 04/23/2016   had infection post surgery requiring him to debride daily  . 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 CORONARY ARTERY BYPASS GRAFT  1997   x 3  . CYSTOSCOPY W/ RETROGRADES Bilateral 09/03/2017   Procedure: CYSTOSCOPY WITH RETROGRADE PYELOGRAM;  Surgeon: Hollice Espy, MD;  Location: [facility];  Service: Urology;  Laterality: Bilateral;  .  CYSTOSCOPY W/ RETROGRADES Right 01/28/2018   Procedure: CYSTOSCOPY WITH RETROGRADE PYELOGRAM;  Surgeon: Hollice Espy, MD;  Location: [facility];  Service: Urology;  Laterality: Right;  . CYSTOSCOPY W/ URETERAL STENT REMOVAL  08/2017  . CYSTOSCOPY WITH BIOPSY Right 01/28/2018   Procedure: CYSTOSCOPY WITH BIOPSY;  Surgeon: Hollice Espy, MD;  Location: [facility];  Service: Urology;  Laterality: Right;  . CYSTOSCOPY WITH STENT PLACEMENT Right 09/03/2017   Procedure: CYSTOSCOPY WITH STENT PLACEMENT;  Surgeon: Hollice Espy, MD;  Location: [facility];  Service: Urology;  Laterality: Right;  .  CYSTOSCOPY WITH STENT PLACEMENT Right 01/28/2018   Procedure: CYSTOSCOPY WITH STENT PLACEMENT;  Surgeon: Hollice Espy, MD;  Location: [facility];  Service: Urology;  Laterality: Right;  . CYSTOSCOPY WITH URETEROSCOPY Right 01/28/2018   Procedure: CYSTOSCOPY WITH URETEROSCOPY;  Surgeon: Hollice Espy, MD;  Location: [facility];  Service: Urology;  Laterality: Right;  . ENDARTERECTOMY Right 07/17/2017   Procedure: ENDARTERECTOMY CAROTID;  Surgeon: Algernon Huxley, MD;  Location: [facility];  Service: Vascular;  Laterality: Right;  . HERNIA REPAIR  2011   Ventral hernia  . HOLMIUM LASER APPLICATION N/A 99/24/2683   Procedure: HOLMIUM LASER APPLICATION;  Surgeon: Hollice Espy, MD;  Location: [facility];  Service: Urology;  Laterality: N/A;  . KNEE SURGERY Left    arthroscopy  . KYPHOPLASTY N/A 08/14/2017   Procedure: [id number];  Surgeon: Hessie Knows, MD;  Location: [facility];  Service: Orthopedics;  Laterality: N/A;  . LARYNX SURGERY    . PROSTATE SURGERY  2002   BPH benign pathology  . SPINE SURGERY  1989   Lumbar disc  . THYROPLASTY Right 01/05/2018   Procedure: THYROPLASTY;  Surgeon: Beverly Gust, MD;  Location: [facility];  Service: ENT;  Laterality: Right;    Prior to Admission medications   Medication Sig Start Date End Date Taking? Authorizing Provider  acetaminophen (TYLENOL) 500 MG tablet Take 1,000 mg every 6 (six) hours as needed by mouth for moderate pain or headache.    [provider]  aspirin EC 81 MG EC tablet Take 1 tablet (81 mg total) by mouth daily. 07/19/17   Algernon Huxley, MD  atenolol (TENORMIN) 50 MG tablet TAKE 1 TABLET BY MOUTH ONCE DAILY 02/21/18   Glean Hess, MD  atorvastatin (LIPITOR) 40 MG tablet Take 1 tablet (40 mg total) daily at 6 PM by mouth. 08/28/17   Glean Hess, MD  Blood Glucose Monitoring Suppl (FIFTY50 GLUCOSE METER 2.0) w/Device KIT 1 Device by Other route 2 (two) times daily. 12/08/17   [provider]  calcitRIOL  (ROCALTROL) 0.25 MCG capsule Take 0.25 mcg by mouth daily. 06/07/18   [provider]  clopidogrel (PLAVIX) 75 MG tablet Take 75 mg by mouth daily. 06/15/17   [provider]  clopidogrel (PLAVIX) 75 MG tablet TAKE 1 TABLET BY MOUTH ONCE DAILY 08/24/18   Glean Hess, MD  glucose blood (RELION PRIME TEST) test strip 1 strip by Other route 2 (two) times daily. 07/09/17   [provider]  insulin NPH Human (HUMULIN N,NOVOLIN N) 100 UNIT/ML injection Inject 0.25 mLs (25 Units total) into the skin 2 (two) times daily. Patient taking differently: Inject 30 Units into the skin 2 (two) times daily.  08/15/17   Vaughan Basta, MD  insulin regular (NOVOLIN R,HUMULIN R) 100 units/mL injection Inject 14 Units into the skin 2 (two) times daily. 15 units in the morning & 10 units in the evening with supper    [provider]  levothyroxine (SYNTHROID, LEVOTHROID) 300 MCG tablet Take 300 mcg by mouth daily before breakfast. 1/2 pill once a week, 1qday other days    [provider]  sodium bicarbonate 650 MG tablet Take 650 mg by mouth 2 (two) times daily. 06/19/18   [provider]  tamsulosin (FLOMAX) 0.4 MG CAPS capsule Take 1 capsule (0.4 mg total) by mouth daily. 06/23/18   Nori Riis, PA-C    Family History  Problem Relation Age of Onset  . Diabetes Mother   . Heart disease Mother   . Heart disease Father   . Cancer Father        lung     Social History   Tobacco Use  . Smoking status: Former Smoker    Packs/day: 1.50    Years: 44.00    Pack years: 66.00    Types: Cigarettes    Last attempt to quit: 06/24/1996    Years since quitting: 22.2  . Smokeless tobacco: Never Used  . Tobacco comment: smoking cessation materials not required  Substance Use Topics  . Alcohol use: Yes    Alcohol/week: 0.0 standard drinks    Comment: rare  . Drug use: No    Allergies as of 10/01/2018 - Review Complete 09/04/2018  Allergen Reaction  Noted  . Ace inhibitors Other (See Comments) 01/20/2015  . Quinapril Rash and Other (See Comments) 07/14/2013    Review of Systems:    All systems reviewed and negative except where noted in HPI.   Physical Exam:  There were no vitals taken for this visit. No LMP for male patient. General:   Alert,  Well-developed, well-nourished, pleasant and cooperative in NAD Head:  Normocephalic and atraumatic. Eyes:  Sclera clear, no icterus.   Conjunctiva pink. Ears:  Normal auditory acuity. Nose:  No deformity, discharge, or lesions. Mouth:  No deformity or lesions,oropharynx pink & moist. Neck:  Supple; no masses or thyromegaly. Lungs:  Respirations even and unlabored.  Clear throughout to auscultation.   No wheezes, crackles, or rhonchi. No acute distress. Heart:  Regular rate and rhythm; no murmurs, clicks, rubs, or gallops. Abdomen:  Normal bowel sounds.  No bruits.  Soft, non-tender and non-distended without masses, hepatosplenomegaly or hernias noted.  No guarding or rebound tenderness.  Negative Carnett sign.   Rectal:  Deferred.  Msk:  Symmetrical without gross deformities.  Good, equal movement & strength bilaterally. Pulses:  Normal pulses noted. Extremities:  No clubbing or edema.  No cyanosis. Neurologic:  Alert and oriented x3;  grossly normal neurologically. Skin:  Intact without significant lesions or rashes.  No jaundice. Lymph Nodes:  No significant cervical adenopathy. Psych:  Alert and cooperative. Normal mood and affect.  Imaging Studies: No results found.  Assessment and Plan:   [name] is a 77 y.o. y/o male who comes in today with a history of a colon cancer in 2011.  The patient has not had a repeat colonoscopy.  The patient had an episode of rectal bleeding which he went to the emergency room for.  The patient now reports that the bleeding has stopped but has not had a colonoscopy since 2011.  The patient will be set up for a colonoscopy to rule out the  patient's hematochezia being from a neoplasm versus diverticulosis versus hemorrhoids.  The patient has been told to see if his reflux becomes more prevalent and if it does he may need to be started on 20 mg of omeprazole every day.  At this point he states it is infrequent.  Patient and his wife have been explained the plan and agree with it  Lucilla Lame, MD. Marval Regal    Note: This dictation was prepared with Dragon dictation along with smaller phrase technology. Any transcriptional errors that result from this process are unintentional.

## 2018-10-13 ENCOUNTER — Other Ambulatory Visit: Payer: Self-pay

## 2018-10-13 ENCOUNTER — Encounter: Payer: Self-pay | Admitting: Emergency Medicine

## 2018-10-13 ENCOUNTER — Ambulatory Visit
Admission: EM | Admit: 2018-10-13 | Discharge: 2018-10-13 | Disposition: A | Discharge status: Home / Self Care | Payer: PPO | Attending: Family Medicine | Admitting: Family Medicine

## 2018-10-13 DIAGNOSIS — Z9189 Other specified personal risk factors, not elsewhere classified: Secondary | ICD-10-CM | POA: Insufficient documentation

## 2018-10-13 DIAGNOSIS — K120 Recurrent oral aphthae: Secondary | ICD-10-CM

## 2018-10-13 MED ORDER — DOXYCYCLINE HYCLATE 100 MG PO TABS: 100.0000 mg | ORAL_TABLET | Freq: Two times a day (BID) | ORAL | 0 refills | Status: DC

## 2018-10-13 MED ORDER — VALACYCLOVIR HCL 500 MG PO TABS: 1000.0000 mg | ORAL_TABLET | Freq: Every day | ORAL | 0 refills | Status: DC

## 2018-10-13 NOTE — ED Triage Notes (Addendum)
Patient in today c/o swelling in his lips and mouth x 3-4 days. Patient states he is unable to eat or sleep. Patient denies any allergies.

## 2018-10-16 ENCOUNTER — Ambulatory Visit (INDEPENDENT_AMBULATORY_CARE_PROVIDER_SITE_OTHER): Payer: Medicare HMO | Admitting: Internal Medicine

## 2018-10-16 ENCOUNTER — Encounter: Payer: Self-pay | Admitting: Internal Medicine

## 2018-10-16 VITALS — BP 110/60 | HR 80 | Temp 98.0°F | Resp 16 | Ht 72.0 in | Wt 215.0 lb

## 2018-10-16 DIAGNOSIS — K029 Dental caries, unspecified: Secondary | ICD-10-CM | POA: Diagnosis not present

## 2018-10-16 DIAGNOSIS — K120 Recurrent oral aphthae: Secondary | ICD-10-CM | POA: Diagnosis not present

## 2018-10-16 DIAGNOSIS — R21 Rash and other nonspecific skin eruption: Secondary | ICD-10-CM

## 2018-10-16 MED ORDER — VALACYCLOVIR HCL 1 G PO TABS: 1000.0000 mg | ORAL_TABLET | Freq: Three times a day (TID) | ORAL | 0 refills | Status: AC

## 2018-10-16 MED ORDER — MAGIC MOUTHWASH W/LIDOCAINE: 5.0000 mL | Freq: Four times a day (QID) | ORAL | 0 refills | Status: DC

## 2018-10-16 NOTE — Progress Notes (Signed)
Date:  10/16/2018   Name:  [name]   DOB:  [birth year]   MRN:  [id number]   Chief Complaint: Rash (mouth sores in mouth x 1 weel and ulcers on tongue also had eye surgery 10 days ago worried it could be related top eye drops? ) Patient was seen in ED on 10/13/18 for complaint of lip swelling.  He was diagnosed with aphthous ulcers and treated with Valtrex and doxycyline.  Since then he thinks he has developed an allergic reaction.  Rash  This is a new problem. The affected locations include the back. The rash is characterized by itchiness. Pertinent negatives include no fatigue, fever or shortness of breath.  Mouth sores - seen in ED.  No better.  Only got valtrex 1000 mg a day x 3.  Taking doxycycline - presumably for possible dental infection.  Review of Systems  Constitutional: Negative for chills, fatigue and fever.  HENT: Positive for mouth sores. Negative for trouble swallowing.   Respiratory: Negative for chest tightness, shortness of breath and wheezing.   Cardiovascular: Negative for chest pain.  Skin: Positive for rash.  Neurological: Negative for dizziness and headaches.    Patient Active Problem List   Diagnosis Date Noted  . Hypertension 03/27/2018  . Metabolic acidosis 39/76/7341  . Secondary hyperparathyroidism of renal origin ([place]) 01/22/2018  . Vitamin D deficiency 01/22/2018  . Hematuria 11/14/2017  . Urinary retention 08/28/2017  . Closed wedge compression fracture of twelfth thoracic vertebra ([place]) 08/11/2017  . Bilateral carotid artery stenosis 08/01/2017  . Hoarseness of voice 08/01/2017  . History of compression fracture of spine 07/28/2017  . History of stroke 06/11/2017  . Chronic shoulder bursitis, left 07/22/2016  . Cholecystitis, acute 04/21/2016  . 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 CAD in native artery  01/20/2015  . Gout 01/20/2015  . Diabetic peripheral neuropathy ([place]) 01/20/2015  . H/O malignant neoplasm of colon 01/20/2015  . Adult hypothyroidism 01/20/2015  . Hyperlipidemia associated with type 2 diabetes mellitus ([place]) 01/20/2015  . High potassium 05/19/2014  . 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 Hypertension in stage 4 chronic kidney disease due to type 2 diabetes mellitus ([place]) 07/15/2013    Allergies  Allergen Reactions  . Ace Inhibitors Other (See Comments)    Reaction:  Raises potassium   . Quinapril Rash and Other (See Comments)    hyperkalemia    Past Surgical History:  Procedure Laterality Date  . APPENDECTOMY  2011  . BACK SURGERY    . CARDIAC CATHETERIZATION  1997  . CAROTID ANGIOGRAPHY Right 06/13/2017   Procedure: Right subclavian and Carotid Angiography, possible intervention;  Surgeon: Algernon Huxley, MD;  Location: [facility];  Service: Cardiovascular;  Laterality: Right;  . CHOLECYSTECTOMY N/A 04/23/2016   had infection post surgery requiring him to debride daily  . 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 CORONARY ARTERY BYPASS GRAFT  1997   x 3  . CYSTOSCOPY W/ RETROGRADES Bilateral 09/03/2017   Procedure: CYSTOSCOPY WITH RETROGRADE PYELOGRAM;  Surgeon: Hollice Espy, MD;  Location: [facility];  Service: Urology;  Laterality: Bilateral;  . CYSTOSCOPY W/ RETROGRADES Right 01/28/2018   Procedure: CYSTOSCOPY WITH RETROGRADE PYELOGRAM;  Surgeon: Hollice Espy, MD;  Location: [facility];  Service: Urology;  Laterality: Right;  .  CYSTOSCOPY W/ URETERAL STENT REMOVAL  08/2017  . CYSTOSCOPY WITH BIOPSY Right 01/28/2018   Procedure: CYSTOSCOPY WITH BIOPSY;  Surgeon: Hollice Espy, MD;  Location: [facility];  Service: Urology;  Laterality: Right;  . CYSTOSCOPY WITH STENT PLACEMENT Right 09/03/2017   Procedure: CYSTOSCOPY WITH STENT PLACEMENT;  Surgeon: Hollice Espy, MD;   Location: [facility];  Service: Urology;  Laterality: Right;  . CYSTOSCOPY WITH STENT PLACEMENT Right 01/28/2018   Procedure: CYSTOSCOPY WITH STENT PLACEMENT;  Surgeon: Hollice Espy, MD;  Location: [facility];  Service: Urology;  Laterality: Right;  . CYSTOSCOPY WITH URETEROSCOPY Right 01/28/2018   Procedure: CYSTOSCOPY WITH URETEROSCOPY;  Surgeon: Hollice Espy, MD;  Location: [facility];  Service: Urology;  Laterality: Right;  . ENDARTERECTOMY Right 07/17/2017   Procedure: ENDARTERECTOMY CAROTID;  Surgeon: Algernon Huxley, MD;  Location: [facility];  Service: Vascular;  Laterality: Right;  . HERNIA REPAIR  2011   Ventral hernia  . HOLMIUM LASER APPLICATION N/A 32/54/9826   Procedure: HOLMIUM LASER APPLICATION;  Surgeon: Hollice Espy, MD;  Location: [facility];  Service: Urology;  Laterality: N/A;  . KNEE SURGERY Left    arthroscopy  . KYPHOPLASTY N/A 08/14/2017   Procedure: [id number];  Surgeon: Hessie Knows, MD;  Location: [facility];  Service: Orthopedics;  Laterality: N/A;  . LARYNX SURGERY    . PROSTATE SURGERY  2002   BPH benign pathology  . SPINE SURGERY  1989   Lumbar disc  . THYROPLASTY Right 01/05/2018   Procedure: THYROPLASTY;  Surgeon: Beverly Gust, MD;  Location: [facility];  Service: ENT;  Laterality: Right;    Social History   Tobacco Use  . Smoking status: Former Smoker    Packs/day: 1.50    Years: 44.00    Pack years: 66.00    Types: Cigarettes    Last attempt to quit: 06/24/1996    Years since quitting: 22.3  . Smokeless tobacco: Never Used  . Tobacco comment: smoking cessation materials not required  Substance Use Topics  . Alcohol use: Yes    Alcohol/week: 0.0 standard drinks    Comment: rare  . Drug use: No     Medication list has been reviewed and updated.  Current Meds  Medication Sig  . acetaminophen (TYLENOL) 500 MG tablet Take 1,000 mg every 6 (six) hours as needed by mouth for moderate pain or headache.  Marland Kitchen aspirin EC 81 MG EC tablet Take 1  tablet (81 mg total) by mouth daily.  Marland Kitchen atenolol (TENORMIN) 50 MG tablet TAKE 1 TABLET BY MOUTH ONCE DAILY  . atorvastatin (LIPITOR) 40 MG tablet Take 1 tablet (40 mg total) daily at 6 PM by mouth.  . Blood Glucose Monitoring Suppl (FIFTY50 GLUCOSE METER 2.0) w/Device KIT 1 Device by Other route 2 (two) times daily.  . calcitRIOL (ROCALTROL) 0.25 MCG capsule Take 0.25 mcg by mouth daily.  . clopidogrel (PLAVIX) 75 MG tablet TAKE 1 TABLET BY MOUTH ONCE DAILY  . doxycycline (VIBRA-TABS) 100 MG tablet Take 1 tablet (100 mg total) by mouth 2 (two) times daily.  Marland Kitchen glucose blood (RELION PRIME TEST) test strip 1 strip by Other route 2 (two) times daily.  . insulin NPH Human (HUMULIN N,NOVOLIN N) 100 UNIT/ML injection Inject 0.25 mLs (25 Units total) into the skin 2 (two) times daily. (Patient taking differently: Inject 30 Units into the skin 2 (two) times daily. )  . insulin regular (NOVOLIN R,HUMULIN R) 100 units/mL injection Inject 14 Units into the skin 2 (two) times daily. 15 units  in the morning & 10 units in the evening with supper  . ketorolac (ACULAR) 0.5 % ophthalmic solution 1 DROP IN LEFT EYE FOUR TIMES A DAY START 1 WEEK PRIOR TO SURGERY  . levothyroxine (SYNTHROID, LEVOTHROID) 300 MCG tablet Take 300 mcg by mouth daily before breakfast. 1/2 pill once a week, 1qday other days  . ofloxacin (OCUFLOX) 0.3 % ophthalmic solution INSTILL ONE DROP IN BOTH EYE FOUR TIMES A DAY. START 72 HOURS PRIOR TO SURGERY.  Marland Kitchen prednisoLONE acetate (PRED FORTE) 1 % ophthalmic suspension 1 DROP IN LEFT EYE FOUR TIMES A DAY START AFTER SURGERY  . sodium bicarbonate 650 MG tablet Take 650 mg by mouth 2 (two) times daily.  . tamsulosin (FLOMAX) 0.4 MG CAPS capsule Take 1 capsule (0.4 mg total) by mouth daily.  . [DISCONTINUED] Na Sulfate-K Sulfate-Mg Sulf (SUPREP BOWEL PREP KIT) 17.5-3.13-1.6 GM/177ML SOLN Take 1 kit by mouth as directed.    PHQ 2/9 Scores 06/29/2018 06/22/2018 11/17/2017 09/10/2017  PHQ - 2 Score 0 0 0 0    PHQ- 9 Score 0 0 - -    Physical Exam Vitals signs and nursing note reviewed.  Constitutional:      General: He is not in acute distress.    Appearance: He is well-developed.  HENT:     Head: Normocephalic and atraumatic.     Mouth/Throat:     Mouth: Mucous membranes are moist.     Dentition: Abnormal dentition. Gingival swelling, dental caries and gum lesions present.     Pharynx: Oropharynx is clear.      Comments: Several aphthous ulcers and erythema inner lower lip Neck:     Musculoskeletal: Normal range of motion.  Cardiovascular:     Rate and Rhythm: Normal rate and regular rhythm.  Pulmonary:     Effort: Pulmonary effort is normal. No respiratory distress.     Breath sounds: Normal breath sounds.  Musculoskeletal: Normal range of motion.  Lymphadenopathy:     Cervical: No cervical adenopathy.  Skin:    General: Skin is warm and dry.     Findings: No rash.     Comments: < 10 Scattered erythematous lesions on upper back - non specific.  Neurological:     Mental Status: He is alert and oriented to person, place, and time.  Psychiatric:        Behavior: Behavior normal.        Thought Content: Thought content normal.     BP 110/60   Pulse 80   Temp 98 F (36.7 C) (Oral)   Resp 16   Ht 6' (1.829 m)   Wt 215 lb (97.5 kg)   BMI 29.16 kg/m   Assessment and Plan: 1. Aphthous ulcer of mouth - magic mouthwash w/lidocaine SOLN; Take 5 mLs by mouth 4 (four) times daily.  Dispense: 300 mL; Refill: 0 - valACYclovir (VALTREX) 1000 MG tablet; Take 1 tablet (1,000 mg total) by mouth 3 (three) times daily.  Dispense: 21 tablet; Refill: 0  2. Dental caries Finish course of doxycline Dental care as soon as possible  3. Rash and nonspecific skin eruption Use otc cortisone cream bid prn itching   Partially dictated using Editor, commissioning. Any errors are unintentional.  Halina Maidens, MD [facility]  10/16/2018

## 2018-10-16 NOTE — Patient Instructions (Signed)
Magic mouthwash - 1 teaspoon swish, gargle and spit four times a day.  Nothing to eat or drink for 15 minutes afterward  Finish the Doxycycline prescription.

## 2018-10-19 ENCOUNTER — Encounter (INDEPENDENT_AMBULATORY_CARE_PROVIDER_SITE_OTHER): Payer: Medicare HMO | Admitting: Ophthalmology

## 2018-10-19 DIAGNOSIS — E11311 Type 2 diabetes mellitus with unspecified diabetic retinopathy with macular edema: Secondary | ICD-10-CM

## 2018-10-19 DIAGNOSIS — H43813 Vitreous degeneration, bilateral: Secondary | ICD-10-CM | POA: Diagnosis not present

## 2018-10-19 DIAGNOSIS — H2511 Age-related nuclear cataract, right eye: Secondary | ICD-10-CM | POA: Diagnosis not present

## 2018-10-19 DIAGNOSIS — H35033 Hypertensive retinopathy, bilateral: Secondary | ICD-10-CM | POA: Diagnosis not present

## 2018-10-19 DIAGNOSIS — E113392 Type 2 diabetes mellitus with moderate nonproliferative diabetic retinopathy without macular edema, left eye: Secondary | ICD-10-CM | POA: Diagnosis not present

## 2018-10-19 DIAGNOSIS — E113311 Type 2 diabetes mellitus with moderate nonproliferative diabetic retinopathy with macular edema, right eye: Secondary | ICD-10-CM | POA: Diagnosis not present

## 2018-10-19 DIAGNOSIS — I10 Essential (primary) hypertension: Secondary | ICD-10-CM

## 2018-10-20 DIAGNOSIS — E875 Hyperkalemia: Secondary | ICD-10-CM | POA: Diagnosis not present

## 2018-10-20 DIAGNOSIS — N185 Chronic kidney disease, stage 5: Secondary | ICD-10-CM | POA: Diagnosis not present

## 2018-10-20 DIAGNOSIS — N2581 Secondary hyperparathyroidism of renal origin: Secondary | ICD-10-CM | POA: Diagnosis not present

## 2018-10-20 DIAGNOSIS — M1A9XX0 Chronic gout, unspecified, without tophus (tophi): Secondary | ICD-10-CM | POA: Diagnosis not present

## 2018-10-20 DIAGNOSIS — E872 Acidosis: Secondary | ICD-10-CM | POA: Diagnosis not present

## 2018-10-21 ENCOUNTER — Other Ambulatory Visit: Payer: Self-pay

## 2018-10-21 ENCOUNTER — Encounter: Payer: Self-pay | Admitting: *Deleted

## 2018-10-21 NOTE — Anesthesia Preprocedure Evaluation (Addendum)
Anesthesia Evaluation    History of Anesthesia Complications (+) history of anesthetic complications (vocal cord paralysis s/p CEA 2018)  Airway Mallampati: II  TM Distance: >3 FB Neck ROM: Full    Dental no notable dental hx.    Pulmonary former smoker (quit 1997),    Pulmonary exam normal breath sounds clear to auscultation       Cardiovascular hypertension, + CAD, + Past MI (1997), + CABG (3V 1997) and + Peripheral Vascular Disease  Normal cardiovascular exam Rhythm:Regular Rate:Normal  On Plavix   Neuro/Psych TIACVA    GI/Hepatic   Endo/Other  diabetes, Poorly Controlled, Type 2, Insulin DependentHypothyroidism   Renal/GU      Musculoskeletal   Abdominal   Peds  Hematology Colon CA   Anesthesia Other Findings Cardiology note 08/17/18:  Assessment and Plan  78 y.o. male with  1. Essential hypertension, benign-we will continue with atenolol 50 mg daily. Low-sodium diet and daily activity recommended.  2. Atherosclerosis of native coronary artery of native heart with stable angina pectoris - doing well from a cardiovascular standpoint. We will continue with current regimen including atenolol 50 mg daily, aspirin clopidogrel with atorvastatin at 40 mg daily.    3. 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   4. Type 2 diabetes mellitus with diabetic polyneuropathy, unspecified whether long term insulin use   Return in about 6 months (around 02/15/2019).   Reproductive/Obstetrics                            Anesthesia Physical Anesthesia Plan  ASA: III  Anesthesia Plan: General   Post-op Pain Management:    Induction: Intravenous  PONV Risk Score and Plan:   Airway Management Planned: Natural Airway  Additional Equipment:   Intra-op Plan:   Post-operative Plan: Extubation in  OR  Informed Consent: I have reviewed the patients History and Physical, chart, labs and discussed the procedure including the risks, benefits and alternatives for the proposed anesthesia with the patient or authorized representative who has indicated his/her understanding and acceptance.   Dental advisory given  Plan Discussed with: CRNA  Anesthesia Plan Comments:         Anesthesia Quick Evaluation

## 2018-10-22 NOTE — Discharge Instructions (Signed)
General Anesthesia, Adult, Care After  This sheet gives you information about how to care for yourself after your procedure. Your health care provider may also give you more specific instructions. If you have problems or questions, contact your health care provider.  What can I expect after the procedure?  After the procedure, the following side effects are common:  Pain or discomfort at the IV site.  Nausea.  Vomiting.  Sore throat.  Trouble concentrating.  Feeling cold or chills.  Weak or tired.  Sleepiness and fatigue.  Soreness and body aches. These side effects can affect parts of the body that were not involved in surgery.  Follow these instructions at home:    For at least 24 hours after the procedure:  Have a responsible adult stay with you. It is important to have someone help care for you until you are awake and alert.  Rest as needed.  Do not:  Participate in activities in which you could fall or become injured.  Drive.  Use heavy machinery.  Drink alcohol.  Take sleeping pills or medicines that cause drowsiness.  Make important decisions or sign legal documents.  Take care of children on your own.  Eating and drinking  Follow any instructions from your health care provider about eating or drinking restrictions.  When you feel hungry, start by eating small amounts of foods that are soft and easy to digest (bland), such as toast. Gradually return to your regular diet.  Drink enough fluid to keep your urine pale yellow.  If you vomit, rehydrate by drinking water, juice, or clear broth.  General instructions  If you have sleep apnea, surgery and certain medicines can increase your risk for breathing problems. Follow instructions from your health care provider about wearing your sleep device:  Anytime you are sleeping, including during daytime naps.  While taking prescription pain medicines, sleeping medicines, or medicines that make you drowsy.  Return to your normal activities as told by your health care  provider. Ask your health care provider what activities are safe for you.  Take over-the-counter and prescription medicines only as told by your health care provider.  If you smoke, do not smoke without supervision.  Keep all follow-up visits as told by your health care provider. This is important.  Contact a health care provider if:  You have nausea or vomiting that does not get better with medicine.  You cannot eat or drink without vomiting.  You have pain that does not get better with medicine.  You are unable to pass urine.  You develop a skin rash.  You have a fever.  You have redness around your IV site that gets worse.  Get help right away if:  You have difficulty breathing.  You have chest pain.  You have blood in your urine or stool, or you vomit blood.  Summary  After the procedure, it is common to have a sore throat or nausea. It is also common to feel tired.  Have a responsible adult stay with you for the first 24 hours after general anesthesia. It is important to have someone help care for you until you are awake and alert.  When you feel hungry, start by eating small amounts of foods that are soft and easy to digest (bland), such as toast. Gradually return to your regular diet.  Drink enough fluid to keep your urine pale yellow.  Return to your normal activities as told by your health care provider. Ask your health care   provider what activities are safe for you.  This information is not intended to replace advice given to you by your health care provider. Make sure you discuss any questions you have with your health care provider.  Document Released: 01/06/2001 Document Revised: 05/16/2017 Document Reviewed: 05/16/2017  Elsevier Interactive Patient Education  2019 [employer]vier Inc[employer]

## 2018-10-26 ENCOUNTER — Ambulatory Visit
Admission: RE | Admit: 2018-10-26 | Discharge: 2018-10-26 | Disposition: A | Discharge status: Home / Self Care | Payer: Medicare HMO | Attending: Gastroenterology | Admitting: Gastroenterology

## 2018-10-26 ENCOUNTER — Ambulatory Visit: Payer: Medicare HMO | Admitting: Anesthesiology

## 2018-10-26 ENCOUNTER — Ambulatory Visit
Admission: RE | Disposition: A | Discharge status: Home / Self Care | Payer: Self-pay | Source: Home / Self Care | Attending: Gastroenterology

## 2018-10-26 DIAGNOSIS — D124 Benign neoplasm of descending colon: Secondary | ICD-10-CM

## 2018-10-26 DIAGNOSIS — Z794 Long term (current) use of insulin: Secondary | ICD-10-CM | POA: Diagnosis not present

## 2018-10-26 DIAGNOSIS — E039 Hypothyroidism, unspecified: Secondary | ICD-10-CM | POA: Diagnosis not present

## 2018-10-26 DIAGNOSIS — Z951 Presence of aortocoronary bypass graft: Secondary | ICD-10-CM | POA: Diagnosis not present

## 2018-10-26 DIAGNOSIS — I2510 Atherosclerotic heart disease of native coronary artery without angina pectoris: Secondary | ICD-10-CM | POA: Insufficient documentation

## 2018-10-26 DIAGNOSIS — K648 Other hemorrhoids: Secondary | ICD-10-CM | POA: Diagnosis not present

## 2018-10-26 DIAGNOSIS — E1122 Type 2 diabetes mellitus with diabetic chronic kidney disease: Secondary | ICD-10-CM | POA: Diagnosis not present

## 2018-10-26 DIAGNOSIS — K635 Polyp of colon: Secondary | ICD-10-CM | POA: Diagnosis not present

## 2018-10-26 DIAGNOSIS — I252 Old myocardial infarction: Secondary | ICD-10-CM | POA: Insufficient documentation

## 2018-10-26 DIAGNOSIS — N184 Chronic kidney disease, stage 4 (severe): Secondary | ICD-10-CM | POA: Insufficient documentation

## 2018-10-26 DIAGNOSIS — I739 Peripheral vascular disease, unspecified: Secondary | ICD-10-CM | POA: Insufficient documentation

## 2018-10-26 DIAGNOSIS — E1142 Type 2 diabetes mellitus with diabetic polyneuropathy: Secondary | ICD-10-CM | POA: Insufficient documentation

## 2018-10-26 DIAGNOSIS — K921 Melena: Secondary | ICD-10-CM | POA: Diagnosis not present

## 2018-10-26 DIAGNOSIS — Z85038 Personal history of other malignant neoplasm of large intestine: Secondary | ICD-10-CM

## 2018-10-26 DIAGNOSIS — Z87891 Personal history of nicotine dependence: Secondary | ICD-10-CM | POA: Diagnosis not present

## 2018-10-26 DIAGNOSIS — D123 Benign neoplasm of transverse colon: Secondary | ICD-10-CM | POA: Diagnosis not present

## 2018-10-26 DIAGNOSIS — E1165 Type 2 diabetes mellitus with hyperglycemia: Secondary | ICD-10-CM | POA: Insufficient documentation

## 2018-10-26 DIAGNOSIS — E785 Hyperlipidemia, unspecified: Secondary | ICD-10-CM | POA: Insufficient documentation

## 2018-10-26 DIAGNOSIS — E1151 Type 2 diabetes mellitus with diabetic peripheral angiopathy without gangrene: Secondary | ICD-10-CM | POA: Insufficient documentation

## 2018-10-26 DIAGNOSIS — I129 Hypertensive chronic kidney disease with stage 1 through stage 4 chronic kidney disease, or unspecified chronic kidney disease: Secondary | ICD-10-CM | POA: Diagnosis not present

## 2018-10-26 DIAGNOSIS — Z8673 Personal history of transient ischemic attack (TIA), and cerebral infarction without residual deficits: Secondary | ICD-10-CM | POA: Diagnosis not present

## 2018-10-26 DIAGNOSIS — Z79899 Other long term (current) drug therapy: Secondary | ICD-10-CM | POA: Insufficient documentation

## 2018-10-26 DIAGNOSIS — Z791 Long term (current) use of non-steroidal anti-inflammatories (NSAID): Secondary | ICD-10-CM | POA: Diagnosis not present

## 2018-10-26 HISTORY — PX: COLONOSCOPY WITH PROPOFOL: SHX5780

## 2018-10-26 HISTORY — PX: POLYPECTOMY: SHX149

## 2018-10-26 HISTORY — DX: Presence of dental prosthetic device (complete) (partial): Z97.2

## 2018-10-26 LAB — GLUCOSE, CAPILLARY
GLUCOSE-CAPILLARY: 262 mg/dL — AB (ref 70–99)
Glucose-Capillary: 261 mg/dL — ABNORMAL HIGH (ref 70–99)

## 2018-10-26 SURGERY — COLONOSCOPY WITH PROPOFOL
Anesthesia: General

## 2018-10-26 MED ORDER — LIDOCAINE HCL (CARDIAC) PF 100 MG/5ML IV SOSY
PREFILLED_SYRINGE | INTRAVENOUS | Status: DC | PRN
  Administered 2018-10-26: 30 mg via INTRAVENOUS

## 2018-10-26 MED ORDER — NON FORMULARY
4.0000 [IU] | Freq: Once | Status: AC
  Administered 2018-10-26: 4 [IU] via SUBCUTANEOUS

## 2018-10-26 MED ORDER — SODIUM CHLORIDE 0.9 % IV SOLN: INTRAVENOUS | Status: DC

## 2018-10-26 MED ORDER — PROPOFOL 10 MG/ML IV BOLUS
INTRAVENOUS | Status: DC | PRN
  Administered 2018-10-26: 20 mg via INTRAVENOUS
  Administered 2018-10-26: 70 mg via INTRAVENOUS
  Administered 2018-10-26 (×2): 30 mg via INTRAVENOUS

## 2018-10-26 MED ORDER — LACTATED RINGERS IV SOLN
INTRAVENOUS | Status: DC
  Administered 2018-10-26: 11:00:00 via INTRAVENOUS

## 2018-10-26 MED FILL — Insulin Lispro Subcutaneous Soln 100 Unit/ML: SUBCUTANEOUS | Qty: 4 | Status: AC

## 2018-10-26 SURGICAL SUPPLY — 10 items
CANISTER SUCT 1200ML W/VALVE (MISCELLANEOUS) ×3 IMPLANT
ELECT REM PT RETURN 9FT ADLT (ELECTROSURGICAL)
ELECTRODE REM PT RTRN 9FT ADLT (ELECTROSURGICAL) IMPLANT
FORCEPS BIOP RAD 4 LRG CAP 4 (CUTTING FORCEPS) ×3 IMPLANT
GOWN CVR UNV OPN BCK APRN NK (MISCELLANEOUS) ×4 IMPLANT
GOWN ISOL THUMB LOOP REG UNIV (MISCELLANEOUS) ×2
KIT ENDO PROCEDURE OLY (KITS) ×3 IMPLANT
SNARE SHORT THROW 13M SML OVAL (MISCELLANEOUS) ×3 IMPLANT
TRAP ETRAP POLY (MISCELLANEOUS) ×3 IMPLANT
WATER STERILE IRR 250ML POUR (IV SOLUTION) ×3 IMPLANT

## 2018-10-26 NOTE — Op Note (Signed)
[facility] Gastroenterology Patient Name: [name] Procedure Date: 10/26/2018 11:38 AM MRN: [id number] Account #: [id number] Date of Birth: [birth year] Admit Type: Outpatient Age: 78 Room: [facility] OR ROOM 01 Gender: Male Note Status: Finalized Procedure:            Colonoscopy Indications:          Hematochezia Providers:            Lucilla Lame MD, MD Referring MD:         Halina Maidens, MD (Referring MD) Medicines:            Propofol per Anesthesia Complications:        No immediate complications. Procedure:            Pre-Anesthesia Assessment:                       - Prior to the procedure, a History and Physical was                        performed, and patient medications and allergies were                        reviewed. The patient's tolerance of previous                        anesthesia was also reviewed. The risks and benefits of                        the procedure and the sedation options and risks were                        discussed with the patient. All questions were                        answered, and informed consent was obtained. Prior                        Anticoagulants: The patient has taken no previous                        anticoagulant or antiplatelet agents. ASA Grade                        Assessment: II - A patient with mild systemic disease.                        After reviewing the risks and benefits, the patient was                        deemed in satisfactory condition to undergo the                        procedure.                       After obtaining informed consent, the colonoscope was                        passed under direct vision. Throughout the procedure,  the patient's blood pressure, pulse, and oxygen                        saturations were monitored continuously. The was                        introduced through the anus and advanced to the the                        transverse colon  for evaluation. This was the intended                        extent. The colonoscopy was performed without                        difficulty. The patient tolerated the procedure well.                        The quality of the bowel preparation was fair. Findings:      The perianal and digital rectal examinations were normal.      Three sessile polyps were found in the transverse colon. The polyps were       3 to 4 mm in size. These polyps were removed with a cold snare.       Resection and retrieval were complete.      A 3 mm polyp was found in the descending colon. The polyp was sessile.       The polyp was removed with a cold snare. Resection and retrieval were       complete.      There was evidence of a prior end-to-side colo-colonic anastomosis in       the transverse colon. This was patent.      Non-bleeding internal hemorrhoids were found during retroflexion. The       hemorrhoids were Grade III (internal hemorrhoids that prolapse but       require manual reduction). Impression:           - Preparation of the colon was fair.                       - Three 3 to 4 mm polyps in the transverse colon,                        removed with a cold snare. Resected and retrieved.                       - One 3 mm polyp in the descending colon, removed with                        a cold snare. Resected and retrieved.                       - Patent end-to-side colo-colonic anastomosis.                       - Non-bleeding internal hemorrhoids. Recommendation:       - Discharge patient to home.                       - Resume previous diet.                       -  Await pathology results.                       - Continue present medications. Procedure Code(s):    --- Professional ---                       [id number], 69, Colonoscopy, flexible; with removal of                        tumor(s), polyp(s), or other lesion(s) by snare                        technique Diagnosis Code(s):    --- Professional  ---                       K92.1, Melena (includes Hematochezia)                       D12.4, Benign neoplasm of descending colon                       D12.3, Benign neoplasm of transverse colon (hepatic                        flexure or splenic flexure) CPT copyright 2018 American Medical Association. All rights reserved. The codes documented in this report are preliminary and upon coder review may  be revised to meet current compliance requirements. Lucilla Lame MD, MD 10/26/2018 12:03:16 PM This report has been signed electronically. Number of Addenda: 0 Note Initiated On: 10/26/2018 11:38 AM Total Procedure Duration: 0 hours 12 minutes 1 second       [facility]

## 2018-10-26 NOTE — Anesthesia Postprocedure Evaluation (Signed)
Anesthesia Post Note  Patient: [name]  Procedure(s) Performed: COLONOSCOPY WITH PROPOFOL (N/A ) POLYPECTOMY INTESTINAL  Patient location during evaluation: PACU Anesthesia Type: General Level of consciousness: awake and alert Pain management: pain level controlled Vital Signs Assessment: post-procedure vital signs reviewed and stable Respiratory status: spontaneous breathing, nonlabored ventilation, respiratory function stable and patient connected to nasal cannula oxygen Cardiovascular status: blood pressure returned to baseline and stable Postop Assessment: no apparent nausea or vomiting Anesthetic complications: no   Pt did not take his normal medicine for diabetes II today.  His BG was 262 preoperatively.  The patient was given 4 units Lispro Humalog per MAP H protocol preoperatively.  In PACU, the BG is 261.  Although this is only slight improvement, we have prevented the BG from going higher.  Additionally, as this is an outpatient center, he is unlikely to bottom out from our therapy.  The patient and his wife do check his glucose and do treat according to those readings.  He and his wife understand to resume checking and treating after leaving the surgery center.  They also know to receive medical attention if the glucose persists outside his normal readings.  Cyndia Diver MD   [name]

## 2018-10-26 NOTE — H&P (Signed)
Lucilla Lame, MD [facility] [address], [place], [place] [address] Phone:[phone] Fax : [phone]  Primary Care Physician:  Glean Hess, MD Primary Gastroenterologist:  Dr. Allen Norris  Pre-Procedure History & Physical: HPI:  [name] is a 78 y.o. male is here for an colonoscopy.   Past Medical History:  Diagnosis Date  . Blood transfusion without reported diagnosis    patient unaware of receiving blood unless it was during surgery and he was not told  . Cancer [facility]) 2001   Colon resection  . CAP (community acquired pneumonia) 08/11/2017  . Carotid stenosis, symptomatic, with infarction ([place]) 07/17/2017  . Chronic kidney disease 11/2017   stage IV CKD per nephrologist.  . Complication of anesthesia    raspy voice since carotid endarterectomy 07/17/17. paralysis of vocal chords  . Coronary artery disease   . Diabetes mellitus without complication ([place])   . Dysrhythmia    patient unaware of any irregular heart rhythms  . Hyperlipidemia   . Hypertension   . Hypothyroidism   . Kidney mass 2019  . Myocardial infarction ([place]) 1997  . Peripheral vascular disease ([place])   . Pneumonia 2014, 2018   developed after surgery 2018  . Stroke ([place]) 07/2017   mild stroke and then had carotid endarterectomy  . Thyroid disease   . Wears dentures    full upper  . Weight loss 2019   patient has lost over 60 pounds since 07/2017.     Past Surgical History:  Procedure Laterality Date  . APPENDECTOMY  2011  . BACK SURGERY    . CARDIAC CATHETERIZATION  1997  . CAROTID ANGIOGRAPHY Right 06/13/2017   Procedure: Right subclavian and Carotid Angiography, possible intervention;  Surgeon: Algernon Huxley, MD;  Location: [facility];  Service: Cardiovascular;  Laterality: Right;  . CHOLECYSTECTOMY N/A 04/23/2016   had infection post surgery requiring him to debride daily  . 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 CORONARY ARTERY  BYPASS GRAFT  1997   x 3  . CYSTOSCOPY W/ RETROGRADES Bilateral 09/03/2017   Procedure: CYSTOSCOPY WITH RETROGRADE PYELOGRAM;  Surgeon: Hollice Espy, MD;  Location: [facility];  Service: Urology;  Laterality: Bilateral;  . CYSTOSCOPY W/ RETROGRADES Right 01/28/2018   Procedure: CYSTOSCOPY WITH RETROGRADE PYELOGRAM;  Surgeon: Hollice Espy, MD;  Location: [facility];  Service: Urology;  Laterality: Right;  . CYSTOSCOPY W/ URETERAL STENT REMOVAL  08/2017  . CYSTOSCOPY WITH BIOPSY Right 01/28/2018   Procedure: CYSTOSCOPY WITH BIOPSY;  Surgeon: Hollice Espy, MD;  Location: [facility];  Service: Urology;  Laterality: Right;  . CYSTOSCOPY WITH STENT PLACEMENT Right 09/03/2017   Procedure: CYSTOSCOPY WITH STENT PLACEMENT;  Surgeon: Hollice Espy, MD;  Location: [facility];  Service: Urology;  Laterality: Right;  . CYSTOSCOPY WITH STENT PLACEMENT Right 01/28/2018   Procedure: CYSTOSCOPY WITH STENT PLACEMENT;  Surgeon: Hollice Espy, MD;  Location: [facility];  Service: Urology;  Laterality: Right;  . CYSTOSCOPY WITH URETEROSCOPY Right 01/28/2018   Procedure: CYSTOSCOPY WITH URETEROSCOPY;  Surgeon: Hollice Espy, MD;  Location: [facility];  Service: Urology;  Laterality: Right;  . ENDARTERECTOMY Right 07/17/2017   Procedure: ENDARTERECTOMY CAROTID;  Surgeon: Algernon Huxley, MD;  Location: [facility];  Service: Vascular;  Laterality: Right;  . HERNIA REPAIR  2011   Ventral hernia  . HOLMIUM LASER APPLICATION N/A 74/09/8785   Procedure: HOLMIUM LASER APPLICATION;  Surgeon: Hollice Espy, MD;  Location: [facility];  Service: Urology;  Laterality: N/A;  . KNEE SURGERY Left    arthroscopy  . KYPHOPLASTY N/A 08/14/2017   Procedure: [id number];  Surgeon: Hessie Knows, MD;  Location: [facility];  Service: Orthopedics;  Laterality: N/A;  . LARYNX SURGERY    . PROSTATE SURGERY  2002   BPH benign pathology  . SPINE SURGERY  1989   Lumbar disc  . THYROPLASTY Right 01/05/2018   Procedure: THYROPLASTY;  Surgeon:  Beverly Gust, MD;  Location: [facility];  Service: ENT;  Laterality: Right;    Prior to Admission medications   Medication Sig Start Date End Date Taking? Authorizing Provider  acetaminophen (TYLENOL) 500 MG tablet Take 1,000 mg every 6 (six) hours as needed by mouth for moderate pain or headache.   Yes [provider]  aspirin EC 81 MG EC tablet Take 1 tablet (81 mg total) by mouth daily. 07/19/17  Yes Algernon Huxley, MD  atenolol (TENORMIN) 50 MG tablet TAKE 1 TABLET BY MOUTH ONCE DAILY 02/21/18  Yes Glean Hess, MD  atorvastatin (LIPITOR) 40 MG tablet Take 1 tablet (40 mg total) daily at 6 PM by mouth. 08/28/17  Yes Glean Hess, MD  brimonidine (ALPHAGAN) 0.2 % ophthalmic solution 1 DROP INTO LEFT EYE THREE TIMES A DAY START 24 HOURS PRIOR TO SURGERY 09/15/18  Yes [provider]  calcitRIOL (ROCALTROL) 0.25 MCG capsule Take 0.25 mcg by mouth daily. 06/07/18  Yes [provider]  clopidogrel (PLAVIX) 75 MG tablet TAKE 1 TABLET BY MOUTH ONCE DAILY 08/24/18  Yes Glean Hess, MD  cyclopentolate (CYCLODRYL,CYCLOGYL) 1 % ophthalmic solution 1 DROP IN LEFT EYE THREE TIMES A DAY START 24 HOURS PRIOR TO SURGERY 09/16/18  Yes [provider]  insulin NPH Human (HUMULIN N,NOVOLIN N) 100 UNIT/ML injection Inject 0.25 mLs (25 Units total) into the skin 2 (two) times daily. Patient taking differently: Inject 30 Units into the skin 2 (two) times daily.  08/15/17  Yes Vaughan Basta, MD  insulin regular (NOVOLIN R,HUMULIN R) 100 units/mL injection Inject 14 Units into the skin 2 (two) times daily. 15 units in the morning & 10 units in the evening with supper   Yes [provider]  ketorolac (ACULAR) 0.5 % ophthalmic solution 1 DROP IN LEFT EYE FOUR TIMES A DAY START 1 WEEK PRIOR TO SURGERY 09/15/18  Yes [provider]  levothyroxine (SYNTHROID, LEVOTHROID) 300 MCG tablet Take 300 mcg by mouth daily before breakfast. 1/2 pill once a week,  1qday other days   Yes [provider]  magic mouthwash w/lidocaine SOLN Take 5 mLs by mouth 4 (four) times daily. 10/16/18  Yes Glean Hess, MD  ofloxacin (OCUFLOX) 0.3 % ophthalmic solution INSTILL ONE DROP IN BOTH EYE FOUR TIMES A DAY. START 72 HOURS PRIOR TO SURGERY. 09/15/18  Yes [provider]  prednisoLONE acetate (PRED FORTE) 1 % ophthalmic suspension 1 DROP IN LEFT EYE FOUR TIMES A DAY START AFTER SURGERY 09/15/18  Yes [provider]  sodium bicarbonate 650 MG tablet Take 650 mg by mouth 2 (two) times daily. 06/19/18  Yes [provider]  tamsulosin (FLOMAX) 0.4 MG CAPS capsule Take 1 capsule (0.4 mg total) by mouth daily. 06/23/18  Yes McGowan, Larene Beach A, PA-C  valACYclovir (VALTREX) 1000 MG tablet Take 1 tablet (1,000 mg total) by mouth 3 (three) times daily. 10/16/18  Yes Glean Hess, MD  Blood Glucose Monitoring Suppl (FIFTY50 GLUCOSE METER 2.0) w/Device KIT 1 Device by Other route 2 (  two) times daily. 12/08/17   [provider]  doxycycline (VIBRA-TABS) 100 MG tablet Take 1 tablet (100 mg total) by mouth 2 (two) times daily. Patient not taking: Reported on 10/26/2018 10/13/18   Norval Gable, MD  glucose blood (RELION PRIME TEST) test strip 1 strip by Other route 2 (two) times daily. 07/09/17   [provider]    Allergies as of 10/01/2018 - Review Complete 10/01/2018  Allergen Reaction Noted  . Ace inhibitors Other (See Comments) 01/20/2015  . Quinapril Rash and Other (See Comments) 07/14/2013    Family History  Problem Relation Age of Onset  . Diabetes Mother   . Heart disease Mother   . Heart disease Father   . Cancer Father        lung    Social History   Socioeconomic History  . Marital status: Married    Spouse name: Not on file  . Number of children: 2  . Years of education: some college  . Highest education level: 12th grade  Occupational History  . Occupation: retired  Scientific laboratory technician  . Financial  resource strain: Not hard at all  . Food insecurity:    Worry: Never true    Inability: Never true  . Transportation needs:    Medical: No    Non-medical: No  Tobacco Use  . Smoking status: Former Smoker    Packs/day: 1.50    Years: 44.00    Pack years: 66.00    Types: Cigarettes    Last attempt to quit: 06/24/1996    Years since quitting: 22.3  . Smokeless tobacco: Never Used  . Tobacco comment: smoking cessation materials not required  Substance and Sexual Activity  . Alcohol use: Yes    Alcohol/week: 0.0 standard drinks    Comment: rare  . Drug use: No  . Sexual activity: Not Currently  Lifestyle  . Physical activity:    Days per week: 0 days    Minutes per session: 0 min  . Stress: Not at all  Relationships  . Social connections:    Talks on phone: Patient refused    Gets together: Patient refused    Attends religious service: Patient refused    Active member of club or organization: Patient refused    Attends meetings of clubs or organizations: Patient refused    Relationship status: Married  . Intimate partner violence:    Fear of current or ex partner: No    Emotionally abused: No    Physically abused: No    Forced sexual activity: No  Other Topics Concern  . Not on file  Social History Narrative  . Not on file    Review of Systems: See HPI, otherwise negative ROS  Physical Exam: BP 133/72   Pulse 82   Temp (!) 97.3 F (36.3 C) (Temporal)   Resp 16   Ht 6' (1.829 m)   Wt 93.9 kg   SpO2 97%   BMI 28.07 kg/m  General:   Alert,  pleasant and cooperative in NAD Head:  Normocephalic and atraumatic. Neck:  Supple; no masses or thyromegaly. Lungs:  Clear throughout to auscultation.    Heart:  Regular rate and rhythm. Abdomen:  Soft, nontender and nondistended. Normal bowel sounds, without guarding, and without rebound.   Neurologic:  Alert and  oriented x4;  grossly normal neurologically.  Impression/Plan: [name] is here for an colonoscopy  to be performed for hematochezia  Risks, benefits, limitations, and alternatives regarding  colonoscopy have  been reviewed with the patient.  Questions have been answered.  All parties agreeable.   Lucilla Lame, MD  10/26/2018, 11:05 AM

## 2018-10-26 NOTE — Anesthesia Procedure Notes (Signed)
Date/Time: 10/26/2018 11:44 AM Performed by: Cameron Ali, CRNA Pre-anesthesia Checklist: Patient identified, Emergency Drugs available, Suction available, Timeout performed and Patient being monitored Patient Re-evaluated:Patient Re-evaluated prior to induction Oxygen Delivery Method: Nasal cannula Placement Confirmation: positive ETCO2

## 2018-10-26 NOTE — Transfer of Care (Signed)
Immediate Anesthesia Transfer of Care Note  Patient: [name]  Procedure(s) Performed: COLONOSCOPY WITH PROPOFOL (N/A ) POLYPECTOMY INTESTINAL  Patient Location: PACU  Anesthesia Type: General  Level of Consciousness: awake, alert  and patient cooperative  Airway and Oxygen Therapy: Patient Spontanous Breathing and Patient connected to supplemental oxygen  Post-op Assessment: Post-op Vital signs reviewed, Patient's Cardiovascular Status Stable, Respiratory Function Stable, Patent Airway and No signs of Nausea or vomiting  Post-op Vital Signs: Reviewed and stable  Complications: No apparent anesthesia complications

## 2018-10-27 ENCOUNTER — Encounter: Payer: Self-pay | Admitting: Gastroenterology

## 2018-10-28 ENCOUNTER — Encounter: Payer: Self-pay | Admitting: Gastroenterology

## 2018-10-28 ENCOUNTER — Other Ambulatory Visit (INDEPENDENT_AMBULATORY_CARE_PROVIDER_SITE_OTHER): Payer: Self-pay | Admitting: Vascular Surgery

## 2018-10-29 ENCOUNTER — Encounter: Payer: Self-pay | Admitting: Gastroenterology

## 2018-10-29 ENCOUNTER — Other Ambulatory Visit: Payer: Self-pay | Admitting: Internal Medicine

## 2018-10-29 DIAGNOSIS — I2510 Atherosclerotic heart disease of native coronary artery without angina pectoris: Secondary | ICD-10-CM

## 2018-11-02 DIAGNOSIS — R69 Illness, unspecified: Secondary | ICD-10-CM | POA: Diagnosis not present

## 2018-11-05 ENCOUNTER — Ambulatory Visit
Admission: RE | Admit: 2018-11-05 | Discharge: 2018-11-05 | Disposition: A | Discharge status: Home / Self Care | Payer: Medicare HMO | Source: Ambulatory Visit | Attending: Urology | Admitting: Urology

## 2018-11-05 DIAGNOSIS — N135 Crossing vessel and stricture of ureter without hydronephrosis: Secondary | ICD-10-CM | POA: Diagnosis present

## 2018-11-05 DIAGNOSIS — N2889 Other specified disorders of kidney and ureter: Secondary | ICD-10-CM | POA: Diagnosis not present

## 2018-11-05 DIAGNOSIS — N289 Disorder of kidney and ureter, unspecified: Secondary | ICD-10-CM | POA: Diagnosis not present

## 2018-11-10 ENCOUNTER — Ambulatory Visit (INDEPENDENT_AMBULATORY_CARE_PROVIDER_SITE_OTHER): Payer: Medicare HMO | Admitting: Urology

## 2018-11-10 ENCOUNTER — Encounter: Payer: Self-pay | Admitting: Urology

## 2018-11-10 VITALS — BP 133/55 | HR 76 | Ht 74.0 in | Wt 215.0 lb

## 2018-11-10 DIAGNOSIS — N184 Chronic kidney disease, stage 4 (severe): Secondary | ICD-10-CM

## 2018-11-10 DIAGNOSIS — N135 Crossing vessel and stricture of ureter without hydronephrosis: Secondary | ICD-10-CM | POA: Diagnosis not present

## 2018-11-10 DIAGNOSIS — E1122 Type 2 diabetes mellitus with diabetic chronic kidney disease: Secondary | ICD-10-CM

## 2018-11-10 NOTE — Progress Notes (Signed)
11/10/2018  4:48 PM   [name] 08-31-1941 [id number]  Referring provider: Glean Hess, MD [address] [place], [place] [address]  Chief Complaint  Patient presents with  . Obstruction of right UPJ    6 mo fu    HPI: [name] is a 78 y.o. White or Caucasian male that presents today follow up today for a 16-monthfollow up renal ultrasound.   He reports today he is doing really well.  Patient reports baseline urinary symptoms. Denies dysuria, gross hematuria or flank/abdominal/pelvic/scrotal pain.  RUS in 10/2018 with no evidence of renal mass or hydronephrosis.  His most recent creatinine was 3.91 which is stable.  History as below: He is undergone several procedures for right hydronephrosis with possible obstruction. Most recently, he returned to the operating room on 01/28/2018 for right retrograde pyelogram, right ureteroscopy, ureteral biopsy, selective cytology balloon dilation and stent placement. He is found to have narrowing within the right proximal ureter with no obvious tumor. There was hydronephrosis proximal to the tapering at the level of the UPJ. There is also pulsation noted at this level concerning for possible underlying UPJ obstruction possibly related to crossing vessel..  Biopsies are negative for any dysplasia or malignancy.  He did undergo Lasix renogram in 12/2017 with normal split function of his right kidney, 51%, delayed emptying of the left kidney at 30 minutes and essentially non-emptying of the right with impaired washout of the radiotracer within dilated collecting system. This is felt to be consistent with obstruction.  Reexamination of old CT scans dating back to 2017 show a tapering at the same level but with slight progression of the hydronephrosis. This appears to be around the location of the gonadal vessels.   Ultimately, he elected to have his stent were removed with plans for conservative management after the  aforementioned surgery.  His stent was removed on 03/31/2018.  Follow-up renal ultrasound on 04/21/2017 shows no further hydronephrosis with slightly decreased echogenicity of his kidneys bilaterally.  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 Blood transfusion without reported diagnosis    patient unaware of receiving blood unless it was during surgery and he was not told  . Cancer ([facility] 2001   Colon resection  . CAP (community acquired pneumonia) 08/11/2017  . Carotid stenosis, symptomatic, with infarction ([facility] 07/17/2017  . Chronic kidney disease 11/2017   stage IV CKD per nephrologist.  . Complication of anesthesia    raspy voice since carotid endarterectomy 07/17/17. paralysis of vocal chords  . Coronary artery disease   . Diabetes mellitus without complication ([facility]   . Dysrhythmia    patient unaware of any irregular heart rhythms  . Hyperlipidemia   . Hypertension   . Hypothyroidism   . Kidney mass 2019  . [facility]ocardial infarction ([facility] 1997  . Peripheral vascular disease ([facility]   . Pneumonia 2014, 2018   developed after surgery 2018  . Stroke ([facility] 07/2017   mild stroke and then had carotid endarterectomy  . Thyroid disease   . Wears dentures    full upper  . Weight loss 2019   patient has lost over 60 pounds since 07/2017.     Surgical History: Past Surgical History:  Procedure Laterality Date  . APPENDECTOMY  2011  . BACK SURGERY    . CARDIAC CATHETERIZATION  1997  . CAROTID ANGIOGRAPHY Right 06/13/2017   Procedure:  Right subclavian and Carotid Angiography, possible intervention;  Surgeon: Algernon Huxley, MD;  Location: [facility];  Service: Cardiovascular;  Laterality: Right;  . CHOLECYSTECTOMY N/A 04/23/2016   had infection post surgery requiring him to debride daily  . 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 COLONOSCOPY WITH PROPOFOL N/A  10/26/2018   Procedure: COLONOSCOPY WITH PROPOFOL;  Surgeon: Lucilla Lame, MD;  Location: [facility];  Service: Endoscopy;  Laterality: N/A;  Diabetic - insulin  . CORONARY ARTERY BYPASS GRAFT  1997   x 3  . CYSTOSCOPY W/ RETROGRADES Bilateral 09/03/2017   Procedure: CYSTOSCOPY WITH RETROGRADE PYELOGRAM;  Surgeon: Hollice Espy, MD;  Location: [facility];  Service: Urology;  Laterality: Bilateral;  . CYSTOSCOPY W/ RETROGRADES Right 01/28/2018   Procedure: CYSTOSCOPY WITH RETROGRADE PYELOGRAM;  Surgeon: Hollice Espy, MD;  Location: [facility];  Service: Urology;  Laterality: Right;  . CYSTOSCOPY W/ URETERAL STENT REMOVAL  08/2017  . CYSTOSCOPY WITH BIOPSY Right 01/28/2018   Procedure: CYSTOSCOPY WITH BIOPSY;  Surgeon: Hollice Espy, MD;  Location: [facility];  Service: Urology;  Laterality: Right;  . CYSTOSCOPY WITH STENT PLACEMENT Right 09/03/2017   Procedure: CYSTOSCOPY WITH STENT PLACEMENT;  Surgeon: Hollice Espy, MD;  Location: [facility];  Service: Urology;  Laterality: Right;  . CYSTOSCOPY WITH STENT PLACEMENT Right 01/28/2018   Procedure: CYSTOSCOPY WITH STENT PLACEMENT;  Surgeon: Hollice Espy, MD;  Location: [facility];  Service: Urology;  Laterality: Right;  . CYSTOSCOPY WITH URETEROSCOPY Right 01/28/2018   Procedure: CYSTOSCOPY WITH URETEROSCOPY;  Surgeon: Hollice Espy, MD;  Location: [facility];  Service: Urology;  Laterality: Right;  . ENDARTERECTOMY Right 07/17/2017   Procedure: ENDARTERECTOMY CAROTID;  Surgeon: Algernon Huxley, MD;  Location: [facility];  Service: Vascular;  Laterality: Right;  . HERNIA REPAIR  2011   Ventral hernia  . HOLMIUM LASER APPLICATION N/A 37/[id number]/9678   Procedure: HOLMIUM LASER APPLICATION;  Surgeon: Hollice Espy, MD;  Location: [facility];  Service: Urology;  Laterality: N/A;  . KNEE SURGERY Left    arthroscopy  . KYPHOPLASTY N/A 08/14/2017   Procedure: [id number];  Surgeon: Hessie Knows, MD;  Location: [facility];  Service: Orthopedics;   Laterality: N/A;  . LARYNX SURGERY    . POLYPECTOMY  10/26/2018   Procedure: POLYPECTOMY INTESTINAL;  Surgeon: Lucilla Lame, MD;  Location: [facility];  Service: Endoscopy;;  Descending colon polyp Transverse colon polyps x 3  . PROSTATE SURGERY  2002   BPH benign pathology  . SPINE SURGERY  1989   Lumbar disc  . THYROPLASTY Right 01/05/2018   Procedure: THYROPLASTY;  Surgeon: Beverly Gust, MD;  Location: [facility];  Service: ENT;  Laterality: Right;    Home Medications:  Allergies as of 11/10/2018      Reactions   Ace Inhibitors Other (See Comments)   Reaction:  Raises potassium    Quinapril Rash, Other (See Comments)   hyperkalemia      Medication List       Accurate as of November 10, 2018  4:48 PM. Always use your most recent med list.        acetaminophen 500 MG tablet Commonly known as:  TYLENOL Take 1,000 mg every 6 (six) hours as needed by mouth for moderate pain or headache.   atenolol 50 MG tablet Commonly known as:  TENORMIN TAKE 1 TABLET BY MOUTH ONCE DAILY   atorvastatin 40 MG tablet Commonly known as:  LIPITOR Take 1 tablet (  40 mg total) daily at 6 PM by mouth.   BESIVANCE 0.6 % Susp Generic drug:  Besifloxacin HCl INSTILL 1 DROP INTO RIGHT EYE 4 TIMES DAILY FOR 2 DAYS AFTER EACH MONTHLY EYE INJECTION   brimonidine 0.2 % ophthalmic solution Commonly known as:  ALPHAGAN 1 DROP INTO LEFT EYE THREE TIMES A DAY START 24 HOURS PRIOR TO SURGERY   calcitRIOL 0.25 MCG capsule Commonly known as:  ROCALTROL Take 0.25 mcg by mouth daily.   clopidogrel 75 MG tablet Commonly known as:  PLAVIX TAKE 1 TABLET BY MOUTH ONCE DAILY   cyclopentolate 1 % ophthalmic solution Commonly known as:  CYCLODRYL,CYCLOGYL 1 DROP IN LEFT EYE THREE TIMES A DAY START 24 HOURS PRIOR TO SURGERY   doxycycline 100 MG tablet Commonly known as:  VIBRA-TABS Take 1 tablet (100 mg total) by mouth 2 (two) times daily.   EQ ASPIRIN ADULT LOW DOSE 81 MG EC tablet Generic  drug:  aspirin TAKE 1  BY MOUTH ONCE DAILY   FIFTY50 GLUCOSE METER 2.0 w/Device Kit 1 Device by Other route 2 (two) times daily.   insulin NPH Human 100 UNIT/ML injection Commonly known as:  HUMULIN N,NOVOLIN N Inject 0.25 mLs (25 Units total) into the skin 2 (two) times daily.   insulin regular 100 units/mL injection Commonly known as:  NOVOLIN R,HUMULIN R Inject 14 Units into the skin 2 (two) times daily. 15 units in the morning & 10 units in the evening with supper   ketorolac 0.5 % ophthalmic solution Commonly known as:  ACULAR 1 DROP IN LEFT EYE FOUR TIMES A DAY START 1 WEEK PRIOR TO SURGERY   levothyroxine 300 MCG tablet Commonly known as:  SYNTHROID, LEVOTHROID Take 300 mcg by mouth daily before breakfast. 1/2 pill once a week, 1qday other days   lidocaine 2 % solution Commonly known as:  XYLOCAINE   magic mouthwash w/lidocaine Soln Take 5 mLs by mouth 4 (four) times daily.   ofloxacin 0.3 % ophthalmic solution Commonly known as:  OCUFLOX INSTILL ONE DROP IN BOTH EYE FOUR TIMES A DAY. START 72 HOURS PRIOR TO SURGERY.   prednisoLONE acetate 1 % ophthalmic suspension Commonly known as:  PRED FORTE 1 DROP IN LEFT EYE FOUR TIMES A DAY START AFTER SURGERY   RELION PRIME TEST test strip Generic drug:  glucose blood 1 strip by Other route 2 (two) times daily.   sodium bicarbonate 650 MG tablet Take 650 mg by mouth 2 (two) times daily.   valACYclovir 1000 MG tablet Commonly known as:  VALTREX Take 1 tablet (1,000 mg total) by mouth 3 (three) times daily.       Allergies:  Allergies  Allergen Reactions  . Ace Inhibitors Other (See Comments)    Reaction:  Raises potassium   . Quinapril Rash and Other (See Comments)    hyperkalemia    Family History: Family History  Problem Relation Age of Onset  . Diabetes Mother   . Heart disease Mother   . Heart disease Father   . Cancer Father        lung    Social History:  reports that he quit smoking about 22  years ago. His smoking use included cigarettes. He has a 66.00 pack-year smoking history. He has never used smokeless tobacco. He reports current alcohol use. He reports that he does not use drugs.  ROS: UROLOGY Frequent Urination?: No Hard to postpone urination?: No Burning/pain with urination?: No Get up at night to urinate?: No Leakage  of urine?: No Urine stream starts and stops?: No Trouble starting stream?: No Do you have to strain to urinate?: No Blood in urine?: No Urinary tract infection?: No Sexually transmitted disease?: No Injury to kidneys or bladder?: No Painful intercourse?: No Weak stream?: No Erection problems?: No Penile pain?: No  Gastrointestinal Nausea?: No Vomiting?: No Indigestion/heartburn?: No Diarrhea?: No Constipation?: No  Constitutional Fever: No Night sweats?: No Weight loss?: No Fatigue?: No  Skin Skin rash/lesions?: No Itching?: No  Eyes Blurred vision?: No Double vision?: No  Ears/Nose/Throat Sore throat?: No Sinus problems?: No  Hematologic/Lymphatic Swollen glands?: No Easy bruising?: No  Cardiovascular Leg swelling?: No Chest pain?: No  Respiratory Cough?: Yes Shortness of breath?: No  Endocrine Excessive thirst?: No  Musculoskeletal Back pain?: No Joint pain?: No  Neurological Headaches?: No Dizziness?: No  Psychologic Depression?: No Anxiety?: No  Physical Exam: BP (!) 133/55 (BP Location: Left Arm, Patient Position: Sitting)   Pulse 76   Ht 6' 2"  (1.88 m)   Wt 215 lb (97.5 kg)   BMI 27.60 kg/m   Constitutional:  Alert and oriented, No acute distress. Accompanied by wife. Pleasant couple. Respiratory: Normal respiratory effort, no increased work of breathing. Head: Normocephalic and Atraumatic. GU: No CVA tenderness Skin: No rashes, bruises or suspicious lesions. Neurologic: Grossly intact, no focal deficits, moving all 4 extremities. Psychiatric: Normal mood and affect.  Laboratory Data: Lab  Results  Component Value Date   WBC 11.6 (H) 08/30/2018   HGB 11.9 (L) 08/30/2018   HCT 36.5 (L) 08/30/2018   MCV 100.0 08/30/2018   PLT 153 08/30/2018   Lab Results  Component Value Date   CREATININE 3.91 (H) 08/30/2018   Pertinent Imaging: Results for orders placed during the hospital encounter of 11/05/18  US RENAL   Narrative CLINICAL DATA:  History of RIGHT UPJ obstruction and hydronephrosis  EXAM: RENAL / URINARY TRACT ULTRASOUND COMPLETE  COMPARISON:  04/20/2018  Correlation: CT abdomen and pelvis 11/13/2017  FINDINGS: Right Kidney:  Renal measurements: 10.9 x 5.2 x 5.3 cm = volume: 155.5 mL. Mild cortical thinning. Increased cortical echogenicity. No mass, hydronephrosis or shadowing calcification.  Left Kidney:  Renal measurements: 11.4 x 5.1 x 5.0 cm = volume: 150.7 mL. Cortical thinning. Increased cortical echogenicity. No mass, hydronephrosis or shadowing calcification.  Bladder:  Well distended without mass or wall thickening. Small LEFT-sided bladder diverticulum identified, confirmed on a prior CT.  IMPRESSION: Medical renal disease changes of both kidneys.  No evidence of renal mass or hydronephrosis.  Small LEFT-sided bladder diverticulum.   Electronically Signed   By: Lavonia Dana M.D.   On: 11/05/2018 16:17    I have personally reviewed all the images today.  Assessment & Plan:    1. 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   - Opted for conservative management which seems to be working well  - Most recent US in 10/2018 continues to show no hydronephrosis  - Follow up in 1 year with RUS  - Patient is currently on flomax, suspect this prescription was written for stent pain and has been continued since.  I asked him to stop the medication and see if he has any changes in his urinary symptoms, if not he may  discontinue this med  2. 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   Return in about 1 year (around 11/09/2019) for RUS.  [facility] [address] [place], [place] [address] [phone]  I, Temidayo Atanda-Ogunleye , am acting as a scribe for Hollice Espy, MD  I spent 30 min with this patient of which greater than 50% was spent in counseling and coordination of care with the patient.

## 2018-11-17 ENCOUNTER — Encounter (INDEPENDENT_AMBULATORY_CARE_PROVIDER_SITE_OTHER): Payer: Medicare HMO | Admitting: Ophthalmology

## 2018-11-17 DIAGNOSIS — E113392 Type 2 diabetes mellitus with moderate nonproliferative diabetic retinopathy without macular edema, left eye: Secondary | ICD-10-CM

## 2018-11-17 DIAGNOSIS — I10 Essential (primary) hypertension: Secondary | ICD-10-CM

## 2018-11-17 DIAGNOSIS — H353121 Nonexudative age-related macular degeneration, left eye, early dry stage: Secondary | ICD-10-CM | POA: Diagnosis not present

## 2018-11-17 DIAGNOSIS — E113311 Type 2 diabetes mellitus with moderate nonproliferative diabetic retinopathy with macular edema, right eye: Secondary | ICD-10-CM

## 2018-11-17 DIAGNOSIS — E11311 Type 2 diabetes mellitus with unspecified diabetic retinopathy with macular edema: Secondary | ICD-10-CM | POA: Diagnosis not present

## 2018-11-17 DIAGNOSIS — H43813 Vitreous degeneration, bilateral: Secondary | ICD-10-CM

## 2018-11-17 DIAGNOSIS — H2511 Age-related nuclear cataract, right eye: Secondary | ICD-10-CM | POA: Diagnosis not present

## 2018-11-17 DIAGNOSIS — H35033 Hypertensive retinopathy, bilateral: Secondary | ICD-10-CM | POA: Diagnosis not present

## 2018-12-03 ENCOUNTER — Other Ambulatory Visit: Payer: Self-pay | Admitting: Internal Medicine

## 2018-12-03 DIAGNOSIS — E1169 Type 2 diabetes mellitus with other specified complication: Secondary | ICD-10-CM

## 2018-12-03 DIAGNOSIS — E785 Hyperlipidemia, unspecified: Principal | ICD-10-CM

## 2018-12-14 DIAGNOSIS — N2581 Secondary hyperparathyroidism of renal origin: Secondary | ICD-10-CM | POA: Diagnosis not present

## 2018-12-14 DIAGNOSIS — N185 Chronic kidney disease, stage 5: Secondary | ICD-10-CM | POA: Diagnosis not present

## 2018-12-14 DIAGNOSIS — E872 Acidosis: Secondary | ICD-10-CM | POA: Diagnosis not present

## 2018-12-14 DIAGNOSIS — I10 Essential (primary) hypertension: Secondary | ICD-10-CM | POA: Diagnosis not present

## 2018-12-15 ENCOUNTER — Encounter (INDEPENDENT_AMBULATORY_CARE_PROVIDER_SITE_OTHER): Payer: Medicare HMO | Admitting: Ophthalmology

## 2018-12-15 DIAGNOSIS — E113311 Type 2 diabetes mellitus with moderate nonproliferative diabetic retinopathy with macular edema, right eye: Secondary | ICD-10-CM | POA: Diagnosis not present

## 2018-12-15 DIAGNOSIS — I10 Essential (primary) hypertension: Secondary | ICD-10-CM

## 2018-12-15 DIAGNOSIS — E11311 Type 2 diabetes mellitus with unspecified diabetic retinopathy with macular edema: Secondary | ICD-10-CM | POA: Diagnosis not present

## 2018-12-15 DIAGNOSIS — H35033 Hypertensive retinopathy, bilateral: Secondary | ICD-10-CM

## 2018-12-15 DIAGNOSIS — H43813 Vitreous degeneration, bilateral: Secondary | ICD-10-CM | POA: Diagnosis not present

## 2018-12-15 DIAGNOSIS — E113392 Type 2 diabetes mellitus with moderate nonproliferative diabetic retinopathy without macular edema, left eye: Secondary | ICD-10-CM | POA: Diagnosis not present

## 2018-12-15 DIAGNOSIS — H2511 Age-related nuclear cataract, right eye: Secondary | ICD-10-CM | POA: Diagnosis not present

## 2018-12-28 DIAGNOSIS — I252 Old myocardial infarction: Secondary | ICD-10-CM | POA: Diagnosis not present

## 2018-12-28 DIAGNOSIS — E1122 Type 2 diabetes mellitus with diabetic chronic kidney disease: Secondary | ICD-10-CM | POA: Diagnosis not present

## 2018-12-28 DIAGNOSIS — E785 Hyperlipidemia, unspecified: Secondary | ICD-10-CM | POA: Diagnosis not present

## 2018-12-28 DIAGNOSIS — Z7902 Long term (current) use of antithrombotics/antiplatelets: Secondary | ICD-10-CM | POA: Diagnosis not present

## 2018-12-28 DIAGNOSIS — I2510 Atherosclerotic heart disease of native coronary artery without angina pectoris: Secondary | ICD-10-CM | POA: Diagnosis not present

## 2018-12-28 DIAGNOSIS — Z7982 Long term (current) use of aspirin: Secondary | ICD-10-CM | POA: Diagnosis not present

## 2018-12-28 DIAGNOSIS — N189 Chronic kidney disease, unspecified: Secondary | ICD-10-CM | POA: Diagnosis not present

## 2018-12-28 DIAGNOSIS — Z794 Long term (current) use of insulin: Secondary | ICD-10-CM | POA: Diagnosis not present

## 2018-12-28 DIAGNOSIS — E039 Hypothyroidism, unspecified: Secondary | ICD-10-CM | POA: Diagnosis not present

## 2018-12-28 DIAGNOSIS — I129 Hypertensive chronic kidney disease with stage 1 through stage 4 chronic kidney disease, or unspecified chronic kidney disease: Secondary | ICD-10-CM | POA: Diagnosis not present

## 2019-01-12 ENCOUNTER — Encounter (INDEPENDENT_AMBULATORY_CARE_PROVIDER_SITE_OTHER): Payer: Medicare HMO | Admitting: Ophthalmology

## 2019-02-13 NOTE — ED Provider Notes (Signed)
[facility]    CSN: [id number] Arrival date & time: 10/13/18  1559     History   Chief Complaint Chief Complaint  Patient presents with  . swollen lips    HPI [name] is a 78 y.o. male.   78 yo male with a c/o lesions and swelling to his lips and mouth for the past 3-4 days. Denies any fevers, chills, injuries.   The history is provided by the patient.    Past Medical History:  Diagnosis Date  . Blood transfusion without reported diagnosis    patient unaware of receiving blood unless it was during surgery and he was not told  . Cancer [facility]) 2001   Colon resection  . CAP (community acquired pneumonia) 08/11/2017  . Carotid stenosis, symptomatic, with infarction ([place]) 07/17/2017  . Chronic kidney disease 11/2017   stage IV CKD per nephrologist.  . Complication of anesthesia    raspy voice since carotid endarterectomy 07/17/17. paralysis of vocal chords  . Coronary artery disease   . Diabetes mellitus without complication ([place])   . Dysrhythmia    patient unaware of any irregular heart rhythms  . Hyperlipidemia   . Hypertension   . Hypothyroidism   . Kidney mass 2019  . Myocardial infarction ([place]) 1997  . Peripheral vascular disease ([place])   . Pneumonia 2014, 2018   developed after surgery 2018  . Stroke ([place]) 07/2017   mild stroke and then had carotid endarterectomy  . Thyroid disease   . Wears dentures    full upper  . Weight loss 2019   patient has lost over 60 pounds since 07/2017.     Patient Active Problem List   Diagnosis Date Noted  . Hematochezia   . Benign neoplasm of transverse colon   . Benign neoplasm of descending colon   . Hypertension 03/27/2018  . Metabolic acidosis 08/67/6195  . Secondary hyperparathyroidism of renal origin ([place]) 01/22/2018  . Vitamin D deficiency 01/22/2018  . Hematuria 11/14/2017  . Urinary retention 08/28/2017  . Closed wedge compression fracture of twelfth thoracic vertebra ([place]) 08/11/2017  .  Bilateral carotid artery stenosis 08/01/2017  . Hoarseness of voice 08/01/2017  . History of compression fracture of spine 07/28/2017  . History of stroke 06/11/2017  . Chronic shoulder bursitis, left 07/22/2016  . Cholecystitis, acute 04/21/2016  . 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 CAD in native artery 01/20/2015  . Gout 01/20/2015  . Diabetic peripheral neuropathy ([place]) 01/20/2015  . H/O malignant neoplasm of colon 01/20/2015  . Adult hypothyroidism 01/20/2015  . Hyperlipidemia associated with type 2 diabetes mellitus ([place]) 01/20/2015  . High potassium 05/19/2014  . 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 Hypertension in stage 4 chronic kidney disease due to type 2 diabetes mellitus ([place]) 07/15/2013    Past Surgical History:  Procedure Laterality Date  . APPENDECTOMY  2011  . BACK SURGERY    . CARDIAC CATHETERIZATION  1997  . CAROTID ANGIOGRAPHY Right 06/13/2017   Procedure: Right subclavian and Carotid Angiography, possible intervention;  Surgeon: Algernon Huxley, MD;  Location: [facility];  Service: Cardiovascular;  Laterality: Right;  . CHOLECYSTECTOMY N/A 04/23/2016   had infection post surgery requiring him to debride daily  . 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 COLONOSCOPY  WITH PROPOFOL N/A 10/26/2018   Procedure: COLONOSCOPY WITH PROPOFOL;  Surgeon: Lucilla Lame, MD;  Location: [place];  Service: Endoscopy;  Laterality: N/A;  Diabetic - insulin  . CORONARY ARTERY BYPASS GRAFT  1997   x 3  . CYSTOSCOPY W/ RETROGRADES Bilateral 09/03/2017   Procedure: CYSTOSCOPY WITH RETROGRADE PYELOGRAM;  Surgeon: Hollice Espy, MD;  Location: [facility];  Service: Urology;  Laterality: Bilateral;  . CYSTOSCOPY W/ RETROGRADES Right 01/28/2018    Procedure: CYSTOSCOPY WITH RETROGRADE PYELOGRAM;  Surgeon: Hollice Espy, MD;  Location: [facility];  Service: Urology;  Laterality: Right;  . CYSTOSCOPY W/ URETERAL STENT REMOVAL  08/2017  . CYSTOSCOPY WITH BIOPSY Right 01/28/2018   Procedure: CYSTOSCOPY WITH BIOPSY;  Surgeon: Hollice Espy, MD;  Location: [facility];  Service: Urology;  Laterality: Right;  . CYSTOSCOPY WITH STENT PLACEMENT Right 09/03/2017   Procedure: CYSTOSCOPY WITH STENT PLACEMENT;  Surgeon: Hollice Espy, MD;  Location: [facility];  Service: Urology;  Laterality: Right;  . CYSTOSCOPY WITH STENT PLACEMENT Right 01/28/2018   Procedure: CYSTOSCOPY WITH STENT PLACEMENT;  Surgeon: Hollice Espy, MD;  Location: [facility];  Service: Urology;  Laterality: Right;  . CYSTOSCOPY WITH URETEROSCOPY Right 01/28/2018   Procedure: CYSTOSCOPY WITH URETEROSCOPY;  Surgeon: Hollice Espy, MD;  Location: [facility];  Service: Urology;  Laterality: Right;  . ENDARTERECTOMY Right 07/17/2017   Procedure: ENDARTERECTOMY CAROTID;  Surgeon: Algernon Huxley, MD;  Location: [facility];  Service: Vascular;  Laterality: Right;  . HERNIA REPAIR  2011   Ventral hernia  . HOLMIUM LASER APPLICATION N/A 09/47/0962   Procedure: HOLMIUM LASER APPLICATION;  Surgeon: Hollice Espy, MD;  Location: [facility];  Service: Urology;  Laterality: N/A;  . KNEE SURGERY Left    arthroscopy  . KYPHOPLASTY N/A 08/14/2017   Procedure: [id number];  Surgeon: Hessie Knows, MD;  Location: [facility];  Service: Orthopedics;  Laterality: N/A;  . LARYNX SURGERY    . POLYPECTOMY  10/26/2018   Procedure: POLYPECTOMY INTESTINAL;  Surgeon: Lucilla Lame, MD;  Location: [place];  Service: Endoscopy;;  Descending colon polyp Transverse colon polyps x 3  . PROSTATE SURGERY  2002   BPH benign pathology  . SPINE SURGERY  1989   Lumbar disc  . THYROPLASTY Right 01/05/2018   Procedure: THYROPLASTY;  Surgeon: Beverly Gust, MD;  Location: [facility];  Service: ENT;  Laterality:  Right;       Home Medications    Prior to Admission medications   Medication Sig Start Date End Date Taking? Authorizing Provider  acetaminophen (TYLENOL) 500 MG tablet Take 1,000 mg every 6 (six) hours as needed by mouth for moderate pain or headache.   Yes [provider]  Blood Glucose Monitoring Suppl (FIFTY50 GLUCOSE METER 2.0) w/Device KIT 1 Device by Other route 2 (two) times daily. 12/08/17  Yes [provider]  brimonidine (ALPHAGAN) 0.2 % ophthalmic solution 1 DROP INTO LEFT EYE THREE TIMES A DAY START 24 HOURS PRIOR TO SURGERY 09/15/18  Yes [provider]  calcitRIOL (ROCALTROL) 0.25 MCG capsule Take 0.25 mcg by mouth daily. 06/07/18  Yes [provider]  clopidogrel (PLAVIX) 75 MG tablet TAKE 1 TABLET BY MOUTH ONCE DAILY 08/24/18  Yes Glean Hess, MD  cyclopentolate (CYCLODRYL,CYCLOGYL) 1 % ophthalmic solution 1 DROP IN LEFT EYE THREE TIMES A DAY START 24 HOURS PRIOR TO SURGERY 09/16/18  Yes [provider]  glucose blood (RELION PRIME TEST) test strip 1 strip by Other route 2 (two) times daily. 07/09/17  Yes [provider]  insulin NPH Human (HUMULIN N,NOVOLIN N) 100 UNIT/ML injection Inject 0.25 mLs (25 Units total) into the skin 2 (two) times daily. Patient taking differently: Inject 30 Units into the skin 2 (two) times daily.  08/15/17  Yes Vaughan Basta, MD  insulin regular (NOVOLIN R,HUMULIN R) 100 units/mL injection Inject 14 Units into the skin 2 (two) times daily. 15 units in the morning & 10 units in the evening with supper   Yes [provider]  ketorolac (ACULAR) 0.5 % ophthalmic solution 1 DROP IN LEFT EYE FOUR TIMES A DAY START 1 WEEK PRIOR TO SURGERY 09/15/18  Yes [provider]  levothyroxine (SYNTHROID, LEVOTHROID) 300 MCG tablet Take 300 mcg by mouth daily before breakfast. 1/2 pill once a week, 1qday other days   Yes [provider]  ofloxacin (OCUFLOX) 0.3 % ophthalmic  solution INSTILL ONE DROP IN BOTH EYE FOUR TIMES A DAY. START 72 HOURS PRIOR TO SURGERY. 09/15/18  Yes [provider]  prednisoLONE acetate (PRED FORTE) 1 % ophthalmic suspension 1 DROP IN LEFT EYE FOUR TIMES A DAY START AFTER SURGERY 09/15/18  Yes [provider]  sodium bicarbonate 650 MG tablet Take 650 mg by mouth 2 (two) times daily. 06/19/18  Yes [provider]  atenolol (TENORMIN) 50 MG tablet TAKE 1 TABLET BY MOUTH ONCE DAILY 10/30/18   Glean Hess, MD  atorvastatin (LIPITOR) 40 MG tablet TAKE 1 TABLET BY MOUTH ONCE DAILY AT  6PM 12/03/18   Glean Hess, MD  BESIVANCE 0.6 % SUSP INSTILL 1 DROP INTO RIGHT EYE 4 TIMES DAILY FOR 2 DAYS AFTER EACH MONTHLY EYE INJECTION 09/21/18   [provider]  doxycycline (VIBRA-TABS) 100 MG tablet Take 1 tablet (100 mg total) by mouth 2 (two) times daily. 10/13/18   Norval Gable, MD  EQ ASPIRIN ADULT LOW DOSE 81 MG EC tablet TAKE 1  BY MOUTH ONCE DAILY 11/04/18   Algernon Huxley, MD  lidocaine (XYLOCAINE) 2 % solution  10/16/18   [provider]  magic mouthwash w/lidocaine SOLN Take 5 mLs by mouth 4 (four) times daily. 10/16/18   Glean Hess, MD  valACYclovir (VALTREX) 1000 MG tablet Take 1 tablet (1,000 mg total) by mouth 3 (three) times daily. 10/16/18   Glean Hess, MD    Family History Family History  Problem Relation Age of Onset  . Diabetes Mother   . Heart disease Mother   . Heart disease Father   . Cancer Father        lung    Social History Social History   Tobacco Use  . Smoking status: Former Smoker    Packs/day: 1.50    Years: 44.00    Pack years: 66.00    Types: Cigarettes    Last attempt to quit: 06/24/1996    Years since quitting: 22.6  . Smokeless tobacco: Never Used  . Tobacco comment: smoking cessation materials not required  Substance Use Topics  . Alcohol use: Yes    Alcohol/week: 0.0 standard drinks    Comment: rare  . Drug use: No     Allergies   Ace  inhibitors and Quinapril   Review of Systems Review of Systems   Physical Exam Triage Vital Signs ED Triage Vitals  Enc Vitals Group     BP 10/13/18 1613 (!) 173/77     Pulse Rate 10/13/18 1613 70     Resp 10/13/18 1613 16     Temp 10/13/18  1613 98.1 F (36.7 C)     Temp Source 10/13/18 1613 Oral     SpO2 10/13/18 1613 100 %     Weight 10/13/18 1613 215 lb (97.5 kg)     Height 10/13/18 1613 6' 2"  (1.88 m)     Head Circumference --      Peak Flow --      Pain Score 10/13/18 1612 6     Pain Loc --      Pain Edu? --      Excl. in [place]? --    No data found.  Updated Vital Signs BP (!) 173/77 (BP Location: Left Arm)   Pulse 70   Temp 98.1 F (36.7 C) (Oral)   Resp 16   Ht 6' 2"  (1.88 m)   Wt 97.5 kg   SpO2 100%   BMI 27.60 kg/m   Visual Acuity Right Eye Distance:   Left Eye Distance:   Bilateral Distance:    Right Eye Near:   Left Eye Near:    Bilateral Near:     Physical Exam Vitals signs and nursing note reviewed.  Constitutional:      General: He is not in acute distress.    Appearance: He is not toxic-appearing or diaphoretic.  HENT:     Mouth/Throat:     Mouth: Oral lesions present.  Neurological:     Mental Status: He is alert.      UC Treatments / Results  Labs (all labs ordered are listed, but only abnormal results are displayed) Labs Reviewed - No data to display  EKG None  Radiology No results found.  Procedures Procedures (including critical care time)  Medications Ordered in UC Medications - No data to display  Initial Impression / Assessment and Plan / UC Course  I have reviewed the triage vital signs and the nursing notes.  Pertinent labs & imaging results that were available during my care of the patient were reviewed by me and considered in my medical decision making (see chart for details).      Final Clinical Impressions(s) / UC Diagnoses   Final diagnoses:  Oral aphthous ulcer  At risk for secondary infection     ED Prescriptions    Medication Sig Dispense Auth. Provider   valACYclovir (VALTREX) 500 MG tablet Take 2 tablets (1,000 mg total) by mouth daily. Patient not taking:  Reported on 10/16/2018 3 tablet Norval Gable, MD   doxycycline (VIBRA-TABS) 100 MG tablet Take 1 tablet (100 mg total) by mouth 2 (two) times daily. 14 tablet Kerah Hardebeck, Linward Foster, MD      1. diagnosis reviewed with patient 2. rx as per orders above; reviewed possible side effects, interactions, risks and benefits  3. Follow-up prn if symptoms worsen or don't improve Controlled Substance Prescriptions [place] Controlled Substance Registry consulted? Not Applicable   Norval Gable, MD 02/13/19 [id number]

## 2019-02-17 DIAGNOSIS — E1169 Type 2 diabetes mellitus with other specified complication: Secondary | ICD-10-CM | POA: Diagnosis not present

## 2019-02-17 DIAGNOSIS — Z8673 Personal history of transient ischemic attack (TIA), and cerebral infarction without residual deficits: Secondary | ICD-10-CM | POA: Diagnosis not present

## 2019-02-17 DIAGNOSIS — E1142 Type 2 diabetes mellitus with diabetic polyneuropathy: Secondary | ICD-10-CM | POA: Diagnosis not present

## 2019-02-17 DIAGNOSIS — E785 Hyperlipidemia, unspecified: Secondary | ICD-10-CM | POA: Diagnosis not present

## 2019-02-17 DIAGNOSIS — E1159 Type 2 diabetes mellitus with other circulatory complications: Secondary | ICD-10-CM | POA: Diagnosis not present

## 2019-02-17 DIAGNOSIS — I10 Essential (primary) hypertension: Secondary | ICD-10-CM | POA: Diagnosis not present

## 2019-02-17 DIAGNOSIS — Z794 Long term (current) use of insulin: Secondary | ICD-10-CM | POA: Diagnosis not present

## 2019-02-17 DIAGNOSIS — I25118 Atherosclerotic heart disease of native coronary artery with other forms of angina pectoris: Secondary | ICD-10-CM | POA: Diagnosis not present

## 2019-02-26 DIAGNOSIS — E1169 Type 2 diabetes mellitus with other specified complication: Secondary | ICD-10-CM | POA: Diagnosis not present

## 2019-02-26 DIAGNOSIS — E1142 Type 2 diabetes mellitus with diabetic polyneuropathy: Secondary | ICD-10-CM | POA: Diagnosis not present

## 2019-02-26 DIAGNOSIS — E063 Autoimmune thyroiditis: Secondary | ICD-10-CM | POA: Diagnosis not present

## 2019-02-26 DIAGNOSIS — E038 Other specified hypothyroidism: Secondary | ICD-10-CM | POA: Diagnosis not present

## 2019-02-26 DIAGNOSIS — I10 Essential (primary) hypertension: Secondary | ICD-10-CM | POA: Diagnosis not present

## 2019-02-26 DIAGNOSIS — E1159 Type 2 diabetes mellitus with other circulatory complications: Secondary | ICD-10-CM | POA: Diagnosis not present

## 2019-02-26 DIAGNOSIS — E785 Hyperlipidemia, unspecified: Secondary | ICD-10-CM | POA: Diagnosis not present

## 2019-02-26 DIAGNOSIS — Z794 Long term (current) use of insulin: Secondary | ICD-10-CM | POA: Diagnosis not present

## 2019-03-03 DIAGNOSIS — H25011 Cortical age-related cataract, right eye: Secondary | ICD-10-CM | POA: Diagnosis not present

## 2019-03-03 DIAGNOSIS — H25041 Posterior subcapsular polar age-related cataract, right eye: Secondary | ICD-10-CM | POA: Diagnosis not present

## 2019-03-03 DIAGNOSIS — H2511 Age-related nuclear cataract, right eye: Secondary | ICD-10-CM | POA: Diagnosis not present

## 2019-03-03 LAB — HM DIABETES EYE EXAM

## 2019-03-04 ENCOUNTER — Encounter: Payer: Self-pay | Admitting: Internal Medicine

## 2019-03-16 DIAGNOSIS — H25041 Posterior subcapsular polar age-related cataract, right eye: Secondary | ICD-10-CM | POA: Diagnosis not present

## 2019-03-16 DIAGNOSIS — H2511 Age-related nuclear cataract, right eye: Secondary | ICD-10-CM | POA: Diagnosis not present

## 2019-03-16 DIAGNOSIS — H25011 Cortical age-related cataract, right eye: Secondary | ICD-10-CM | POA: Diagnosis not present

## 2019-03-16 DIAGNOSIS — H25811 Combined forms of age-related cataract, right eye: Secondary | ICD-10-CM | POA: Diagnosis not present

## 2019-03-22 ENCOUNTER — Other Ambulatory Visit: Payer: Self-pay

## 2019-03-22 ENCOUNTER — Encounter (INDEPENDENT_AMBULATORY_CARE_PROVIDER_SITE_OTHER): Payer: Medicare HMO | Admitting: Ophthalmology

## 2019-03-22 DIAGNOSIS — I10 Essential (primary) hypertension: Secondary | ICD-10-CM | POA: Diagnosis not present

## 2019-03-22 DIAGNOSIS — E11311 Type 2 diabetes mellitus with unspecified diabetic retinopathy with macular edema: Secondary | ICD-10-CM

## 2019-03-22 DIAGNOSIS — E113311 Type 2 diabetes mellitus with moderate nonproliferative diabetic retinopathy with macular edema, right eye: Secondary | ICD-10-CM

## 2019-03-22 DIAGNOSIS — E113392 Type 2 diabetes mellitus with moderate nonproliferative diabetic retinopathy without macular edema, left eye: Secondary | ICD-10-CM

## 2019-03-22 DIAGNOSIS — H43813 Vitreous degeneration, bilateral: Secondary | ICD-10-CM

## 2019-03-22 DIAGNOSIS — H35033 Hypertensive retinopathy, bilateral: Secondary | ICD-10-CM

## 2019-03-23 DIAGNOSIS — H2511 Age-related nuclear cataract, right eye: Secondary | ICD-10-CM | POA: Diagnosis not present

## 2019-03-30 ENCOUNTER — Ambulatory Visit (INDEPENDENT_AMBULATORY_CARE_PROVIDER_SITE_OTHER): Payer: Medicare HMO | Admitting: Vascular Surgery

## 2019-03-30 ENCOUNTER — Other Ambulatory Visit: Payer: Self-pay

## 2019-03-30 ENCOUNTER — Ambulatory Visit (INDEPENDENT_AMBULATORY_CARE_PROVIDER_SITE_OTHER): Payer: Medicare HMO

## 2019-03-30 VITALS — BP 170/89 | HR 69 | Resp 16 | Ht 74.0 in | Wt 220.0 lb

## 2019-03-30 DIAGNOSIS — E1142 Type 2 diabetes mellitus with diabetic polyneuropathy: Secondary | ICD-10-CM | POA: Diagnosis not present

## 2019-03-30 DIAGNOSIS — Z9889 Other specified postprocedural states: Secondary | ICD-10-CM | POA: Diagnosis not present

## 2019-03-30 DIAGNOSIS — Z794 Long term (current) use of insulin: Secondary | ICD-10-CM

## 2019-03-30 DIAGNOSIS — I10 Essential (primary) hypertension: Secondary | ICD-10-CM | POA: Diagnosis not present

## 2019-03-30 DIAGNOSIS — Z7982 Long term (current) use of aspirin: Secondary | ICD-10-CM | POA: Diagnosis not present

## 2019-03-30 DIAGNOSIS — Z79899 Other long term (current) drug therapy: Secondary | ICD-10-CM

## 2019-03-30 DIAGNOSIS — I6522 Occlusion and stenosis of left carotid artery: Secondary | ICD-10-CM | POA: Diagnosis not present

## 2019-03-30 DIAGNOSIS — Z8673 Personal history of transient ischemic attack (TIA), and cerebral infarction without residual deficits: Secondary | ICD-10-CM

## 2019-03-30 DIAGNOSIS — I6523 Occlusion and stenosis of bilateral carotid arteries: Secondary | ICD-10-CM

## 2019-03-30 DIAGNOSIS — I63239 Cerebral infarction due to unspecified occlusion or stenosis of unspecified carotid arteries: Secondary | ICD-10-CM

## 2019-03-30 NOTE — Progress Notes (Signed)
MRN : [id number]  [name] is a 78 y.o. (25-Feb-1941) male who presents with chief complaint of  Chief Complaint  Patient presents with  . Follow-up    ultrasound follow up  .  History of Present Illness: Patient returns in follow-up of his carotid disease.  He is doing well today.  No current focal neurologic symptoms.  His carotid duplex today shows a widely patent right carotid endarterectomy and a known left ICA occlusion.  His carotid endarterectomy was performed in 2018.  Current Outpatient Medications  Medication Sig Dispense Refill  . acetaminophen (TYLENOL) 500 MG tablet Take 1,000 mg every 6 (six) hours as needed by mouth for moderate pain or headache.    Marland Kitchen atenolol (TENORMIN) 50 MG tablet TAKE 1 TABLET BY MOUTH ONCE DAILY 90 tablet 3  . atorvastatin (LIPITOR) 40 MG tablet TAKE 1 TABLET BY MOUTH ONCE DAILY AT  6PM 90 tablet 0  . BESIVANCE 0.6 % SUSP INSTILL 1 DROP INTO RIGHT EYE 4 TIMES DAILY FOR 2 DAYS AFTER EACH MONTHLY EYE INJECTION    . Blood Glucose Monitoring Suppl (FIFTY50 GLUCOSE METER 2.0) w/Device KIT 1 Device by Other route 2 (two) times daily.    . brimonidine (ALPHAGAN) 0.2 % ophthalmic solution 1 DROP INTO LEFT EYE THREE TIMES A DAY START 24 HOURS PRIOR TO SURGERY  1  . calcitRIOL (ROCALTROL) 0.25 MCG capsule Take 0.25 mcg by mouth daily.    . calcitRIOL (ROCALTROL) 0.5 MCG capsule     . clopidogrel (PLAVIX) 75 MG tablet TAKE 1 TABLET BY MOUTH ONCE DAILY 30 tablet 5  . cyclopentolate (CYCLODRYL,CYCLOGYL) 1 % ophthalmic solution 1 DROP IN LEFT EYE THREE TIMES A DAY START 24 HOURS PRIOR TO SURGERY  0  . EQ ASPIRIN ADULT LOW DOSE 81 MG EC tablet TAKE 1  BY MOUTH ONCE DAILY 90 tablet 0  . glucose blood (RELION PRIME TEST) test strip 1 strip by Other route 2 (two) times daily.    . insulin NPH Human (HUMULIN N,NOVOLIN N) 100 UNIT/ML injection Inject 0.25 mLs (25 Units total) into the skin 2 (two) times daily. (Patient taking differently: Inject 30 Units into the  skin 2 (two) times daily. ) 10 mL 11  . insulin regular (NOVOLIN R,HUMULIN R) 100 units/mL injection Inject 14 Units into the skin 2 (two) times daily. 15 units in the morning & 10 units in the evening with supper    . ketorolac (ACULAR) 0.5 % ophthalmic solution 1 DROP IN LEFT EYE FOUR TIMES A DAY START 1 WEEK PRIOR TO SURGERY  4  . levothyroxine (SYNTHROID, LEVOTHROID) 300 MCG tablet Take 300 mcg by mouth daily before breakfast. 1/2 pill once a week, 1qday other days    . lidocaine (XYLOCAINE) 2 % solution     . magic mouthwash w/lidocaine SOLN Take 5 mLs by mouth 4 (four) times daily. 300 mL 0  . ofloxacin (OCUFLOX) 0.3 % ophthalmic solution INSTILL ONE DROP IN BOTH EYE FOUR TIMES A DAY. START 72 HOURS PRIOR TO SURGERY.  1  . prednisoLONE acetate (PRED FORTE) 1 % ophthalmic suspension 1 DROP IN LEFT EYE FOUR TIMES A DAY START AFTER SURGERY  1  . sodium bicarbonate 650 MG tablet Take 650 mg by mouth 2 (two) times daily.    . valACYclovir (VALTREX) 1000 MG tablet Take 1 tablet (1,000 mg total) by mouth 3 (three) times daily. 21 tablet 0  . doxycycline (VIBRA-TABS) 100 MG tablet Take 1 tablet (100 mg  total) by mouth 2 (two) times daily. (Patient not taking: Reported on 03/30/2019) 14 tablet 0   No current facility-administered medications for this visit.     Past Medical History:  Diagnosis Date  . Blood transfusion without reported diagnosis    patient unaware of receiving blood unless it was during surgery and he was not told  . Cancer [facility]) 2001   Colon resection  . CAP (community acquired pneumonia) 08/11/2017  . Carotid stenosis, symptomatic, with infarction ([place]) 07/17/2017  . Chronic kidney disease 11/2017   stage IV CKD per nephrologist.  . Complication of anesthesia    raspy voice since carotid endarterectomy 07/17/17. paralysis of vocal chords  . Coronary artery disease   . Diabetes mellitus without complication ([place])   . Dysrhythmia    patient unaware of any irregular heart  rhythms  . Hyperlipidemia   . Hypertension   . Hypothyroidism   . Kidney mass 2019  . Myocardial infarction ([place]) 1997  . Peripheral vascular disease ([place])   . Pneumonia 2014, 2018   developed after surgery 2018  . Stroke ([place]) 07/2017   mild stroke and then had carotid endarterectomy  . Thyroid disease   . Wears dentures    full upper  . Weight loss 2019   patient has lost over 60 pounds since 07/2017.     Past Surgical History:  Procedure Laterality Date  . APPENDECTOMY  2011  . BACK SURGERY    . CARDIAC CATHETERIZATION  1997  . CAROTID ANGIOGRAPHY Right 06/13/2017   Procedure: Right subclavian and Carotid Angiography, possible intervention;  Surgeon: Algernon Huxley, MD;  Location: [facility];  Service: Cardiovascular;  Laterality: Right;  . CHOLECYSTECTOMY N/A 04/23/2016   had infection post surgery requiring him to debride daily  . 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 COLONOSCOPY WITH PROPOFOL N/A 10/26/2018   Procedure: COLONOSCOPY WITH PROPOFOL;  Surgeon: Lucilla Lame, MD;  Location: [place];  Service: Endoscopy;  Laterality: N/A;  Diabetic - insulin  . CORONARY ARTERY BYPASS GRAFT  1997   x 3  . CYSTOSCOPY W/ RETROGRADES Bilateral 09/03/2017   Procedure: CYSTOSCOPY WITH RETROGRADE PYELOGRAM;  Surgeon: Hollice Espy, MD;  Location: [facility];  Service: Urology;  Laterality: Bilateral;  . CYSTOSCOPY W/ RETROGRADES Right 01/28/2018   Procedure: CYSTOSCOPY WITH RETROGRADE PYELOGRAM;  Surgeon: Hollice Espy, MD;  Location: [facility];  Service: Urology;  Laterality: Right;  . CYSTOSCOPY W/ URETERAL STENT REMOVAL  08/2017  . CYSTOSCOPY WITH BIOPSY Right 01/28/2018   Procedure: CYSTOSCOPY WITH BIOPSY;  Surgeon: Hollice Espy, MD;  Location: [facility];  Service: Urology;  Laterality: Right;  . CYSTOSCOPY WITH STENT PLACEMENT Right 09/03/2017   Procedure: CYSTOSCOPY WITH STENT PLACEMENT;  Surgeon: Hollice Espy,  MD;  Location: [facility];  Service: Urology;  Laterality: Right;  . CYSTOSCOPY WITH STENT PLACEMENT Right 01/28/2018   Procedure: CYSTOSCOPY WITH STENT PLACEMENT;  Surgeon: Hollice Espy, MD;  Location: [facility];  Service: Urology;  Laterality: Right;  . CYSTOSCOPY WITH URETEROSCOPY Right 01/28/2018   Procedure: CYSTOSCOPY WITH URETEROSCOPY;  Surgeon: Hollice Espy, MD;  Location: [facility];  Service: Urology;  Laterality: Right;  . ENDARTERECTOMY Right 07/17/2017   Procedure: ENDARTERECTOMY CAROTID;  Surgeon: Algernon Huxley, MD;  Location: [facility];  Service: Vascular;  Laterality: Right;  . HERNIA REPAIR  2011   Ventral hernia  . HOLMIUM LASER APPLICATION N/A 41/96/2229   Procedure: HOLMIUM LASER APPLICATION;  Surgeon: Hollice Espy, MD;  Location: [facility];  Service: Urology;  Laterality: N/A;  . KNEE SURGERY Left    arthroscopy  . KYPHOPLASTY N/A 08/14/2017   Procedure: [id number];  Surgeon: Hessie Knows, MD;  Location: [facility];  Service: Orthopedics;  Laterality: N/A;  . LARYNX SURGERY    . POLYPECTOMY  10/26/2018   Procedure: POLYPECTOMY INTESTINAL;  Surgeon: Lucilla Lame, MD;  Location: [place];  Service: Endoscopy;;  Descending colon polyp Transverse colon polyps x 3  . PROSTATE SURGERY  2002   BPH benign pathology  . SPINE SURGERY  1989   Lumbar disc  . THYROPLASTY Right 01/05/2018   Procedure: THYROPLASTY;  Surgeon: Beverly Gust, MD;  Location: [facility];  Service: ENT;  Laterality: Right;   Social History        Tobacco Use  . Smoking status: Former Smoker    Years: 1.00    Types: Cigarettes    Last attempt to quit: 06/24/1996    Years since quitting: 21.7  . Smokeless tobacco: Never Used  Substance Use Topics  . Alcohol use: No    Alcohol/week: 0.0 oz    Comment: rare  . Drug use: No     Family History      Family History  Problem Relation Age of Onset  . Cancer Mother        Lung  . Diabetes Mother   . Heart disease  Mother   . Heart disease Father           Allergies  Allergen Reactions  . Ace Inhibitors Other (See Comments)    Reaction:  Raises potassium   . Quinapril Rash and Other (See Comments)    hyperkalemia     REVIEW OF SYSTEMS (Negative unless checked)  Constitutional: [] ?Weight loss  [] ?Fever  [] ?Chills Cardiac: [] ?Chest pain   [] ?Chest pressure   [] ?Palpitations   [] ?Shortness of breath when laying flat   [] ?Shortness of breath at rest   [] ?Shortness of breath with exertion. Vascular:  [] ?Pain in legs with walking   [] ?Pain in legs at rest   [] ?Pain in legs when laying flat   [] ?Claudication   [] ?Pain in feet when walking  [] ?Pain in feet at rest  [] ?Pain in feet when laying flat   [] ?History of DVT   [] ?Phlebitis   [] ?Swelling in legs   [] ?Varicose veins   [] ?Non-healing ulcers Pulmonary:   [] ?Uses home oxygen   [] ?Productive cough   [] ?Hemoptysis   [] ?Wheeze  [] ?COPD   [] ?Asthma Neurologic:  [] ?Dizziness  [] ?Blackouts   [] ?Seizures   [x] ?History of stroke   [] ?History of TIA  [] ?Aphasia   [] ?Temporary blindness   [x] ?Dysphagia   [] ?Weakness or numbness in arms   [] ?Weakness or numbness in legs Musculoskeletal:  [x] ?Arthritis   [] ?Joint swelling   [x] ?Joint pain   [] ?Low back pain Hematologic:  [] ?Easy bruising  [] ?Easy bleeding   [] ?Hypercoagulable state   [] ?Anemic  [] ?Hepatitis Gastrointestinal:  [] ?Blood in stool   [] ?Vomiting blood  [] ?Gastroesophageal reflux/heartburn   [] ?Difficulty swallowing. Genitourinary:  [x] ?Chronic kidney disease   [x] ?Difficult urination  [] ?Frequent urination  [] ?Burning with urination   [] ?Blood in urine Skin:  [] ?Rashes   [] ?Ulcers   [] ?Wounds Psychological:  [] ?History of anxiety   [] ? History of major depression.    Physical Examination  Vitals:   03/30/19 1004 03/30/19 1005  BP: 102/66 (!) 170/89  Pulse: 69   Resp: 16   Weight: 220 lb (99.8 kg)   Height: 6' 2"  (1.88  m)    Body mass index is 28.25 kg/m. Gen:  WD/WN, NAD  Head: [place]/AT, No temporalis wasting. Ear/Nose/Throat: Hearing grossly intact, nares w/o erythema or drainage, trachea midline Eyes: Conjunctiva clear. Sclera non-icteric Neck: Supple.  No bruit  Pulmonary:  Good air movement, equal and clear to auscultation bilaterally.  Cardiac: RRR, No JVD Vascular:  Vessel Right Left  Radial Palpable Palpable           Musculoskeletal: M/S 5/5 throughout.  No deformity or atrophy. Mild LE edema. Neurologic: CN 2-12 intact. Sensation grossly intact in extremities.  Symmetrical.  Speech is fluent. Motor exam as listed above. Psychiatric: Judgment intact, Mood & affect appropriate for pt's clinical situation. Dermatologic: No rashes or ulcers noted.  No cellulitis or open wounds. Lymph : No Cervical, Axillary, or Inguinal lymphadenopathy.     CBC Lab Results  Component Value Date   WBC 11.6 (H) 08/30/2018   HGB 11.9 (L) 08/30/2018   HCT 36.5 (L) 08/30/2018   MCV 100.0 08/30/2018   PLT 153 08/30/2018    BMET    Component Value Date/Time   NA 137 08/30/2018 1443   NA 135 (A) 06/16/2018   NA 140 12/10/2012 0726   K 5.9 (H) 08/30/2018 1443   K 4.6 12/10/2012 0726   CL 111 08/30/2018 1443   CL 111 (H) 12/10/2012 0726   CO2 19 (L) 08/30/2018 1443   CO2 20 (L) 12/10/2012 0726   GLUCOSE 213 (H) 08/30/2018 1443   GLUCOSE 181 (H) 12/10/2012 0726   BUN 57 (H) 08/30/2018 1443   BUN 57 (A) 06/16/2018   BUN 33 (H) 09/01/2013 0951   CREATININE 3.91 (H) 08/30/2018 1443   CREATININE 2.27 (H) 09/01/2013 0951   CALCIUM 8.4 (L) 08/30/2018 1443   CALCIUM 8.1 (L) 12/10/2012 0726   GFRNONAA 13 (L) 08/30/2018 1443   GFRNONAA 28 (L) 09/01/2013 0951   GFRAA 16 (L) 08/30/2018 1443   GFRAA 32 (L) 09/01/2013 0951   CrCl cannot be calculated (Patient's most recent lab result is older than the maximum 21 days allowed.).  COAG Lab Results  Component Value Date   INR 1.07 11/13/2017   INR 1.13 07/10/2017   INR 1.07 06/11/2017    Radiology No  results found.    Assessment/Plan Hypertension blood pressure control important in reducing the progression of atherosclerotic disease. On appropriate oral medications.   Diabetes mellitus with polyneuropathy blood glucose control important in reducing the progression of atherosclerotic disease. Also, involved in wound healing. On appropriate medications.   Carotid stenosis, symptomatic, with infarction [facility]) His carotid duplex today shows a widely patent right carotid endarterectomy and a known left carotid artery occlusion. Doing well now.  Continue current medical regimen including aspirin and Lipitor.  Recheck in 1 year    Leotis Pain, MD  03/30/2019 12:30 PM    This note was created with Dragon medical transcription system.  Any errors from dictation are purely unintentional

## 2019-04-21 ENCOUNTER — Other Ambulatory Visit: Payer: Self-pay

## 2019-04-21 ENCOUNTER — Encounter (INDEPENDENT_AMBULATORY_CARE_PROVIDER_SITE_OTHER): Payer: Medicare HMO | Admitting: Ophthalmology

## 2019-04-21 DIAGNOSIS — E113392 Type 2 diabetes mellitus with moderate nonproliferative diabetic retinopathy without macular edema, left eye: Secondary | ICD-10-CM | POA: Diagnosis not present

## 2019-04-21 DIAGNOSIS — I10 Essential (primary) hypertension: Secondary | ICD-10-CM | POA: Diagnosis not present

## 2019-04-21 DIAGNOSIS — H35033 Hypertensive retinopathy, bilateral: Secondary | ICD-10-CM | POA: Diagnosis not present

## 2019-04-21 DIAGNOSIS — E113311 Type 2 diabetes mellitus with moderate nonproliferative diabetic retinopathy with macular edema, right eye: Secondary | ICD-10-CM

## 2019-04-21 DIAGNOSIS — E11311 Type 2 diabetes mellitus with unspecified diabetic retinopathy with macular edema: Secondary | ICD-10-CM | POA: Diagnosis not present

## 2019-04-21 DIAGNOSIS — H43813 Vitreous degeneration, bilateral: Secondary | ICD-10-CM | POA: Diagnosis not present

## 2019-05-19 ENCOUNTER — Other Ambulatory Visit: Payer: Self-pay | Admitting: Internal Medicine

## 2019-05-19 ENCOUNTER — Encounter (INDEPENDENT_AMBULATORY_CARE_PROVIDER_SITE_OTHER): Payer: Medicare HMO | Admitting: Ophthalmology

## 2019-05-19 DIAGNOSIS — I2510 Atherosclerotic heart disease of native coronary artery without angina pectoris: Secondary | ICD-10-CM

## 2019-05-26 ENCOUNTER — Encounter (INDEPENDENT_AMBULATORY_CARE_PROVIDER_SITE_OTHER): Payer: Medicare HMO | Admitting: Ophthalmology

## 2019-05-26 ENCOUNTER — Other Ambulatory Visit: Payer: Self-pay

## 2019-05-26 DIAGNOSIS — E113392 Type 2 diabetes mellitus with moderate nonproliferative diabetic retinopathy without macular edema, left eye: Secondary | ICD-10-CM

## 2019-05-26 DIAGNOSIS — H35033 Hypertensive retinopathy, bilateral: Secondary | ICD-10-CM | POA: Diagnosis not present

## 2019-05-26 DIAGNOSIS — I10 Essential (primary) hypertension: Secondary | ICD-10-CM | POA: Diagnosis not present

## 2019-05-26 DIAGNOSIS — H43813 Vitreous degeneration, bilateral: Secondary | ICD-10-CM | POA: Diagnosis not present

## 2019-05-26 DIAGNOSIS — E11311 Type 2 diabetes mellitus with unspecified diabetic retinopathy with macular edema: Secondary | ICD-10-CM | POA: Diagnosis not present

## 2019-05-26 DIAGNOSIS — E113311 Type 2 diabetes mellitus with moderate nonproliferative diabetic retinopathy with macular edema, right eye: Secondary | ICD-10-CM

## 2019-05-31 DIAGNOSIS — E1142 Type 2 diabetes mellitus with diabetic polyneuropathy: Secondary | ICD-10-CM | POA: Diagnosis not present

## 2019-05-31 DIAGNOSIS — E785 Hyperlipidemia, unspecified: Secondary | ICD-10-CM | POA: Diagnosis not present

## 2019-05-31 DIAGNOSIS — Z794 Long term (current) use of insulin: Secondary | ICD-10-CM | POA: Diagnosis not present

## 2019-05-31 DIAGNOSIS — E1159 Type 2 diabetes mellitus with other circulatory complications: Secondary | ICD-10-CM | POA: Diagnosis not present

## 2019-05-31 DIAGNOSIS — E063 Autoimmune thyroiditis: Secondary | ICD-10-CM | POA: Diagnosis not present

## 2019-05-31 DIAGNOSIS — E1169 Type 2 diabetes mellitus with other specified complication: Secondary | ICD-10-CM | POA: Diagnosis not present

## 2019-05-31 DIAGNOSIS — E038 Other specified hypothyroidism: Secondary | ICD-10-CM | POA: Diagnosis not present

## 2019-05-31 DIAGNOSIS — I10 Essential (primary) hypertension: Secondary | ICD-10-CM | POA: Diagnosis not present

## 2019-06-24 ENCOUNTER — Other Ambulatory Visit: Payer: Self-pay

## 2019-06-24 ENCOUNTER — Encounter (INDEPENDENT_AMBULATORY_CARE_PROVIDER_SITE_OTHER): Payer: Medicare HMO | Admitting: Ophthalmology

## 2019-06-24 DIAGNOSIS — H35033 Hypertensive retinopathy, bilateral: Secondary | ICD-10-CM

## 2019-06-24 DIAGNOSIS — E113292 Type 2 diabetes mellitus with mild nonproliferative diabetic retinopathy without macular edema, left eye: Secondary | ICD-10-CM

## 2019-06-24 DIAGNOSIS — H43813 Vitreous degeneration, bilateral: Secondary | ICD-10-CM

## 2019-06-24 DIAGNOSIS — E113311 Type 2 diabetes mellitus with moderate nonproliferative diabetic retinopathy with macular edema, right eye: Secondary | ICD-10-CM

## 2019-06-24 DIAGNOSIS — I10 Essential (primary) hypertension: Secondary | ICD-10-CM

## 2019-06-24 DIAGNOSIS — E11311 Type 2 diabetes mellitus with unspecified diabetic retinopathy with macular edema: Secondary | ICD-10-CM

## 2019-06-30 ENCOUNTER — Ambulatory Visit (INDEPENDENT_AMBULATORY_CARE_PROVIDER_SITE_OTHER): Payer: Medicare HMO

## 2019-06-30 VITALS — BP 153/94 | HR 62 | Ht 74.0 in | Wt 215.0 lb

## 2019-06-30 DIAGNOSIS — Z0000 Encounter for general adult medical examination without abnormal findings: Secondary | ICD-10-CM

## 2019-06-30 NOTE — Progress Notes (Signed)
Subjective:   [name] is a 78 y.o. male who presents for Medicare Annual/Subsequent preventive examination.  Virtual Visit via Telephone Note  I connected with [name] on 06/30/19 at  9:20 AM EDT by telephone and verified that I am speaking with the correct person using two identifiers.  Medicare Annual Wellness visit completed telephonically due to Covid-19 pandemic.   Location: Patient: home Provider: office   I discussed the limitations, risks, security and privacy concerns of performing an evaluation and management service by telephone and the availability of in person appointments. The patient expressed understanding and agreed to proceed.  Some vital signs may be absent or patient reported.   Clemetine Marker, LPN    Review of Systems:   Cardiac Risk Factors include: advanced age (>33mn, >>71women);diabetes mellitus;hypertension;dyslipidemia;male gender;sedentary lifestyle     Objective:    Vitals: BP (!) 153/94   Pulse 62   Ht _0  (1.88 m)   Wt 215 lb (97.5 kg)   BMI 27.60 kg/m   Body mass index is 27.6 kg/m.  Advanced Directives 06/30/2019 10/26/2018 08/30/2018 06/22/2018 12/23/2017 12/13/2017 12/11/2017  Does Patient Have a Medical Advance Directive? _1  No No  Does patient want to make changes to medical advance directive? - - - - - - -  Would patient like information on creating a medical advance directive? Yes (MAU/Ambulatory/Procedural Areas - Information given) No - Patient declined No - Patient declined Yes (MAU/Ambulatory/Procedural Areas - Information given) No - Patient declined No - Patient declined Yes (MAU/Ambulatory/Procedural Areas - Information given)    Tobacco Social History   Tobacco Use  Smoking Status Former Smoker  . Packs/day: 1.50  . Years: 44.00  . Pack years: 66.00  . Types: Cigarettes  . Quit date: 06/24/1996  . Years since quitting: 23.0  Smokeless Tobacco Never Used  Tobacco Comment   smoking cessation  materials not required     Counseling given: Not Answered Comment: smoking cessation materials not required   Clinical Intake:  Pre-visit preparation completed: Yes  Pain : No/denies pain     BMI - recorded: 27.6 Nutritional Status: BMI 25 -29 Overweight Nutritional Risks: None Diabetes: Yes CBG done?: No Did pt. bring in CBG monitor from home?: No   Nutrition Risk Assessment:  Has the patient had any N/V/D within the last 2 months?  No  Does the patient have any non-healing wounds?  No  Has the patient had any unintentional weight loss or weight gain?  No   Diabetes:  Is the patient diabetic?  Yes  If diabetic, was a CBG obtained today?  No  Did the patient bring in their glucometer from home?  No  How often do you monitor your CBG's? Every so often per patient.   Financial Strains and Diabetes Management:  Are you having any financial strains with the device, your supplies or your medication? No .  Does the patient want to be seen by Chronic Care Management for management of their diabetes?  No  Would the patient like to be referred to a Nutritionist or for Diabetic Management?  No   Diabetic Exams:  Diabetic Eye Exam: Completed 03/03/19 positive retinopathy.   Diabetic Foot Exam: Completed 06/29/18.  How often do you need to have someone help you when you read instructions, pamphlets, or other written materials from your doctor or pharmacy?: 1 - Never  Interpreter Needed?: No  Information entered by :: KClemetine MarkerLPN  Past  Medical History:  Diagnosis Date  . Blood transfusion without reported diagnosis    patient unaware of receiving blood unless it was during surgery and he was not told  . Cancer [facility]) 2001   Colon resection  . CAP (community acquired pneumonia) 08/11/2017  . Carotid stenosis, symptomatic, with infarction ([place]) 07/17/2017  . Chronic kidney disease 11/2017   stage IV CKD per nephrologist.  . Complication of anesthesia    raspy voice  since carotid endarterectomy 07/17/17. paralysis of vocal chords  . Coronary artery disease   . Diabetes mellitus without complication ([place])   . Dysrhythmia    patient unaware of any irregular heart rhythms  . Hyperlipidemia   . Hypertension   . Hypothyroidism   . Kidney mass 2019  . Myocardial infarction ([place]) 1997  . Peripheral vascular disease ([place])   . Pneumonia 2014, 2018   developed after surgery 2018  . Stroke ([place]) 07/2017   mild stroke and then had carotid endarterectomy  . Thyroid disease   . Wears dentures    full upper  . Weight loss 2019   patient has lost over 60 pounds since 07/2017.    Past Surgical History:  Procedure Laterality Date  . APPENDECTOMY  2011  . BACK SURGERY    . CARDIAC CATHETERIZATION  1997  . CAROTID ANGIOGRAPHY Right 06/13/2017   Procedure: Right subclavian and Carotid Angiography, possible intervention;  Surgeon: Algernon Huxley, MD;  Location: [facility];  Service: Cardiovascular;  Laterality: Right;  . CATARACT EXTRACTION, BILATERAL    . CHOLECYSTECTOMY N/A 04/23/2016   had infection post surgery requiring him to debride daily  . 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 COLONOSCOPY WITH PROPOFOL N/A 10/26/2018   Procedure: COLONOSCOPY WITH PROPOFOL;  Surgeon: Lucilla Lame, MD;  Location: [facility];  Service: Endoscopy;  Laterality: N/A;  Diabetic - insulin  . CORONARY ARTERY BYPASS GRAFT  1997   x 3  . CYSTOSCOPY W/ RETROGRADES Bilateral 09/03/2017   Procedure: CYSTOSCOPY WITH RETROGRADE PYELOGRAM;  Surgeon: Hollice Espy, MD;  Location: [facility];  Service: Urology;  Laterality: Bilateral;  . CYSTOSCOPY W/ RETROGRADES Right 01/28/2018   Procedure: CYSTOSCOPY WITH RETROGRADE PYELOGRAM;  Surgeon: Hollice Espy, MD;  Location: [facility];  Service: Urology;  Laterality: Right;  . CYSTOSCOPY W/ URETERAL STENT REMOVAL  08/2017  . CYSTOSCOPY WITH BIOPSY Right 01/28/2018   Procedure:  CYSTOSCOPY WITH BIOPSY;  Surgeon: Hollice Espy, MD;  Location: [facility];  Service: Urology;  Laterality: Right;  . CYSTOSCOPY WITH STENT PLACEMENT Right 09/03/2017   Procedure: CYSTOSCOPY WITH STENT PLACEMENT;  Surgeon: Hollice Espy, MD;  Location: [facility];  Service: Urology;  Laterality: Right;  . CYSTOSCOPY WITH STENT PLACEMENT Right 01/28/2018   Procedure: CYSTOSCOPY WITH STENT PLACEMENT;  Surgeon: Hollice Espy, MD;  Location: [facility];  Service: Urology;  Laterality: Right;  . CYSTOSCOPY WITH URETEROSCOPY Right 01/28/2018   Procedure: CYSTOSCOPY WITH URETEROSCOPY;  Surgeon: Hollice Espy, MD;  Location: [facility];  Service: Urology;  Laterality: Right;  . ENDARTERECTOMY Right 07/17/2017   Procedure: ENDARTERECTOMY CAROTID;  Surgeon: Algernon Huxley, MD;  Location: [facility];  Service: Vascular;  Laterality: Right;  . HERNIA REPAIR  2011   Ventral hernia  . HOLMIUM LASER APPLICATION N/A [id number]   Procedure: HOLMIUM LASER APPLICATION;  Surgeon: Hollice Espy, MD;  Location: [facility];  Service: Urology;  Laterality: N/A;  . KNEE SURGERY Left    arthroscopy  .  KYPHOPLASTY N/A 08/14/2017   Procedure: [id number];  Surgeon: Hessie Knows, MD;  Location: [facility];  Service: Orthopedics;  Laterality: N/A;  . LARYNX SURGERY    . POLYPECTOMY  10/26/2018   Procedure: POLYPECTOMY INTESTINAL;  Surgeon: Lucilla Lame, MD;  Location: [facility];  Service: Endoscopy;;  Descending colon polyp Transverse colon polyps x 3  . PROSTATE SURGERY  2002   BPH benign pathology  . SPINE SURGERY  1989   Lumbar disc  . THYROPLASTY Right 01/05/2018   Procedure: THYROPLASTY;  Surgeon: Beverly Gust, MD;  Location: [facility];  Service: ENT;  Laterality: Right;   Family History  Problem Relation Age of Onset  . Diabetes Mother   . Heart disease Mother   . Heart disease Father   . Cancer Father        lung   Social History   Socioeconomic History  . Marital status: Married    Spouse  name: Not on file  . Number of children: 2  . Years of education: some college  . Highest education level: 12th grade  Occupational History  . Occupation: retired  Scientific laboratory technician  . Financial resource strain: Not hard at all  . Food insecurity    Worry: Never true    Inability: Never true  . Transportation needs    Medical: No    Non-medical: No  Tobacco Use  . Smoking status: Former Smoker    Packs/day: 1.50    Years: 44.00    Pack years: 66.00    Types: Cigarettes    Quit date: 06/24/1996    Years since quitting: 23.0  . Smokeless tobacco: Never Used  . Tobacco comment: smoking cessation materials not required  Substance and Sexual Activity  . Alcohol use: Yes    Alcohol/week: 0.0 standard drinks    Comment: rare  . Drug use: No  . Sexual activity: Not Currently  Lifestyle  . Physical activity    Days per week: 0 days    Minutes per session: 0 min  . Stress: Only a little  Relationships  . Social Herbalist on phone: Patient refused    Gets together: Patient refused    Attends religious service: Patient refused    Active member of club or organization: Patient refused    Attends meetings of clubs or organizations: Patient refused    Relationship status: Married  Other Topics Concern  . Not on file  Social History Narrative  . Not on file    Outpatient Encounter Medications as of 06/30/2019  Medication Sig  . acetaminophen (TYLENOL) 500 MG tablet Take 1,000 mg every 6 (six) hours as needed by mouth for moderate pain or headache.  Marland Kitchen atenolol (TENORMIN) 50 MG tablet TAKE 1 TABLET BY MOUTH ONCE DAILY  . atorvastatin (LIPITOR) 40 MG tablet TAKE 1 TABLET BY MOUTH ONCE DAILY AT  6PM  . Blood Glucose Monitoring Suppl (FIFTY50 GLUCOSE METER 2.0) w/Device KIT 1 Device by Other route 2 (two) times daily.  . calcitRIOL (ROCALTROL) 0.25 MCG capsule Take 0.25 mcg by mouth daily.  . clopidogrel (PLAVIX) 75 MG tablet Take 1 tablet by mouth once daily  . EQ ASPIRIN  ADULT LOW DOSE 81 MG EC tablet TAKE 1  BY MOUTH ONCE DAILY  . glucose blood (RELION PRIME TEST) test strip 1 strip by Other route 2 (two) times daily.  . insulin NPH Human (HUMULIN N,NOVOLIN N) 100 UNIT/ML injection Inject 0.25 mLs (25 Units total) into the skin  2 (two) times daily. (Patient taking differently: Inject 30 Units into the skin 2 (two) times daily. )  . insulin regular (NOVOLIN R,HUMULIN R) 100 units/mL injection Inject 14 Units into the skin 2 (two) times daily. 15 units in the morning & 10 units in the evening with supper  . levothyroxine (SYNTHROID, LEVOTHROID) 300 MCG tablet Take 300 mcg by mouth daily before breakfast. 1/2 pill once a week, 1qday other days  . sodium bicarbonate 650 MG tablet Take 650 mg by mouth 2 (two) times daily.  . valACYclovir (VALTREX) 1000 MG tablet Take 1 tablet (1,000 mg total) by mouth 3 (three) times daily.  . [DISCONTINUED] BESIVANCE 0.6 % SUSP INSTILL 1 DROP INTO RIGHT EYE 4 TIMES DAILY FOR 2 DAYS AFTER EACH MONTHLY EYE INJECTION  . [DISCONTINUED] brimonidine (ALPHAGAN) 0.2 % ophthalmic solution 1 DROP INTO LEFT EYE THREE TIMES A DAY START 24 HOURS PRIOR TO SURGERY  . [DISCONTINUED] calcitRIOL (ROCALTROL) 0.5 MCG capsule   . [DISCONTINUED] cyclopentolate (CYCLODRYL,CYCLOGYL) 1 % ophthalmic solution 1 DROP IN LEFT EYE THREE TIMES A DAY START 24 HOURS PRIOR TO SURGERY  . [DISCONTINUED] doxycycline (VIBRA-TABS) 100 MG tablet Take 1 tablet (100 mg total) by mouth 2 (two) times daily. (Patient not taking: Reported on 03/30/2019)  . [DISCONTINUED] ketorolac (ACULAR) 0.5 % ophthalmic solution 1 DROP IN LEFT EYE FOUR TIMES A DAY START 1 WEEK PRIOR TO SURGERY  . [DISCONTINUED] lidocaine (XYLOCAINE) 2 % solution   . [DISCONTINUED] magic mouthwash w/lidocaine SOLN Take 5 mLs by mouth 4 (four) times daily.  . [DISCONTINUED] ofloxacin (OCUFLOX) 0.3 % ophthalmic solution INSTILL ONE DROP IN BOTH EYE FOUR TIMES A DAY. START 72 HOURS PRIOR TO SURGERY.  . [DISCONTINUED]  prednisoLONE acetate (PRED FORTE) 1 % ophthalmic suspension 1 DROP IN LEFT EYE FOUR TIMES A DAY START AFTER SURGERY   No facility-administered encounter medications on file as of 06/30/2019.     Activities of Daily Living In your present state of health, do you have any difficulty performing the following activities: 06/30/2019 10/26/2018  Hearing? N N  Comment declines hearing aids -  Vision? N N  Difficulty concentrating or making decisions? N N  Walking or climbing stairs? N N  Dressing or bathing? N N  Doing errands, shopping? N -  Preparing Food and eating ? N -  Using the Toilet? N -  In the past six months, have you accidently leaked urine? N -  Do you have problems with loss of bowel control? N -  Managing your Medications? N -  Managing your Finances? N -  Housekeeping or managing your Housekeeping? N -  Some recent data might be hidden    Patient Care Team: Glean Hess, MD as PCP - General (Internal Medicine) Lloyd Huger, MD as Consulting Physician (Oncology) Ubaldo Glassing Javier Docker, MD as Consulting Physician (Cardiology) Hollice Espy, MD as Consulting Physician (Urology) Lonia Farber, MD as Consulting Physician (Endocrinology) Hessie Knows, MD as Consulting Physician (Orthopedic Surgery) Beverly Gust, MD as Consulting Physician (Otolaryngology) Lucky Cowboy Erskine Squibb, MD as Consulting Physician (Vascular Surgery) Justin Mend, MD as Attending Physician (Nephrology)   Assessment:   This is a routine wellness examination for Berley.  Exercise Activities and Dietary recommendations Current Exercise Habits: The patient does not participate in regular exercise at present, Exercise limited by: orthopedic condition(s)  Goals    . DIET - INCREASE WATER INTAKE     Recommend to drink at least 6-8 8oz glasses of water per  day.    . Finances     Get truck on road again and get ALL finances caught up.        Fall Risk Fall Risk  06/30/2019 10/16/2018  06/29/2018 06/22/2018 11/17/2017  Falls in the past year? 0 0 Yes Yes Yes  Number falls in past yr: 0 0 _0 Injury with Fall? 0 0 Yes Yes Yes  Comment - - "2 broken vertebraes" broken vertebrae -  Risk Factor Category  - - - High Fall Risk -  Risk for fall due to : - - - History of fall(s);Impaired vision;Other (Comment);Impaired balance/gait History of fall(s)  Risk for fall due to: Comment - - - h/o CVA; wears eyeglasses; use of walker or cane when needed -  Follow up Falls prevention discussed - - Falls evaluation completed;Education provided;Falls prevention discussed -   FALL RISK PREVENTION PERTAINING TO THE HOME:  Any stairs in or around the home? Yes  If so, do they handrails? Yes   Home free of loose throw rugs in walkways, pet beds, electrical cords, etc? Yes  Adequate lighting in your home to reduce risk of falls? Yes   ASSISTIVE DEVICES UTILIZED TO PREVENT FALLS:  Life alert? No  Use of a cane, walker or w/c? Yes  Grab bars in the bathroom? Yes  Shower chair or bench in shower? Yes  Elevated toilet seat or a handicapped toilet? Yes   DME ORDERS:  DME order needed?  No   TIMED UP AND GO:  Was the test performed? No . Telephonic visit.   Education: Fall risk prevention has been discussed.  Intervention(s) required? No   Depression Screen PHQ 2/9 Scores 06/30/2019 06/29/2018 06/22/2018 11/17/2017  PHQ - 2 Score 0 0 0 0  PHQ- 9 Score - 0 0 -    Cognitive Function     6CIT Screen 06/22/2018 07/22/2016  What Year? 0 points 0 points  What month? 3 points 0 points  What time? 0 points 0 points  Count back from 20 0 points 0 points  Months in reverse 0 points 0 points  Repeat phrase 6 points 0 points  Total Score 9 0    Immunization History  Administered Date(s) Administered  . Influenza, High Dose Seasonal PF 06/22/2018  . Influenza,inj,Quad PF,6+ Mos 09/14/2015, 07/22/2016, 07/07/2017  . Pneumococcal Conjugate-13 03/19/2016  . Pneumococcal Polysaccharide-23  03/09/2013  . Tdap 01/19/2013, 03/09/2013    Qualifies for Shingles Vaccine? Yes . Due for Shingrix. Education has been provided regarding the importance of this vaccine. Pt has been advised to call insurance company to determine out of pocket expense. Advised may also receive vaccine at local pharmacy or Health Dept. Verbalized acceptance and understanding.  Tdap: Up to date  Flu Vaccine: Due for Flu vaccine. Does the patient want to receive this vaccine today?  No . Education has been provided regarding the importance of this vaccine but still declined. Advised may receive this vaccine at local pharmacy or Health Dept. Aware to provide a copy of the vaccination record if obtained from local pharmacy or Health Dept. Verbalized acceptance and understanding.  Pneumococcal Vaccine: Up to date    Screening Tests Health Maintenance  Topic Date Due  . HEMOGLOBIN A1C  12/15/2018  . INFLUENZA VACCINE  05/15/2019  . URINE MICROALBUMIN  06/17/2019  . FOOT EXAM  06/30/2019  . OPHTHALMOLOGY EXAM  03/02/2020  . TETANUS/TDAP  03/10/2023  . PNA vac Low Risk Adult  Completed   Cancer  Screenings:  Colorectal Screening: Completed 10/26/18.  No longer required.   Lung Cancer Screening: (Low Dose CT Chest recommended if Age 70-80 years, 30 pack-year currently smoking OR have quit w/in 15years.) does not qualify.    Additional Screening:  Hepatitis C Screening: no longer required  Vision Screening: Recommended annual ophthalmology exams for early detection of glaucoma and other disorders of the eye. Is the patient up to date with their annual eye exam?  Yes  Who is the provider or what is the name of the office in which the pt attends annual eye exams? Dr. Zigmund Daniel   Dental Screening: Recommended annual dental exams for proper oral hygiene  Community Resource Referral:  CRR required this visit?  No       Plan:    I have personally reviewed and addressed the Medicare Annual Wellness  questionnaire and have noted the following in the patient's chart:  A. Medical and social history B. Use of alcohol, tobacco or illicit drugs  C. Current medications and supplements D. Functional ability and status E.  Nutritional status F.  Physical activity G. Advance directives H. List of other physicians I.  Hospitalizations, surgeries, and ER visits in previous 12 months J.  [place] such as hearing and vision if needed, cognitive and depression L. Referrals and appointments   In addition, I have reviewed and discussed with patient certain preventive protocols, quality metrics, and best practice recommendations. A written personalized care plan for preventive services as well as general preventive health recommendations were provided to patient.   Signed,  Clemetine Marker, LPN Nurse Health Advisor   Nurse Notes: pt doing well and appreciative of visit today.

## 2019-06-30 NOTE — Patient Instructions (Signed)
[name] , Thank you for taking time to come for your Medicare Wellness Visit. I appreciate your ongoing commitment to your health goals. Please review the following plan we discussed and let me know if I can assist you in the future.   Screening recommendations/referrals: Colonoscopy: done 10/26/18 Recommended yearly ophthalmology/optometry visit for glaucoma screening and checkup Recommended yearly dental visit for hygiene and checkup  Vaccinations: Influenza vaccine: appt scheduled for Monday 07/05/19 Pneumococcal vaccine: done 03/19/16 Tdap vaccine: done 03/09/13 Shingles vaccine: Shingrix discussed. Please contact your pharmacy for coverage information.   Advanced directives: Advance directive discussed with you today. I have provided a copy for you to complete at home and have notarized. Once this is complete please bring a copy in to our office so we can scan it into your chart.  Conditions/risks identified: Recommend increasing physical activity as tolerated  Next appointment: Please follow up in one year for your Medicare Annual Wellness visit.    Preventive Care 23 Years and Older, Male Preventive care refers to lifestyle choices and visits with your health care provider that can promote health and wellness. What does preventive care include?  A yearly physical exam. This is also called an annual well check.  Dental exams once or twice a year.  Routine eye exams. Ask your health care provider how often you should have your eyes checked.  Personal lifestyle choices, including:  Daily care of your teeth and gums.  Regular physical activity.  Eating a healthy diet.  Avoiding tobacco and drug use.  Limiting alcohol use.  Practicing safe sex.  Taking low doses of aspirin every day.  Taking vitamin and mineral supplements as recommended by your health care provider. What happens during an annual well check? The services and screenings done by your health care provider  during your annual well check will depend on your age, overall health, lifestyle risk factors, and family history of disease. Counseling  Your health care provider may ask you questions about your:  Alcohol use.  Tobacco use.  Drug use.  Emotional well-being.  Home and relationship well-being.  Sexual activity.  Eating habits.  History of falls.  Memory and ability to understand (cognition).  Work and work Statistician. Screening  You may have the following tests or measurements:  Height, weight, and BMI.  Blood pressure.  Lipid and cholesterol levels. These may be checked every 5 years, or more frequently if you are over 66 years old.  Skin check.  Lung cancer screening. You may have this screening every year starting at age 50 if you have a 30-pack-year history of smoking and currently smoke or have quit within the past 15 years.  Fecal occult blood test (FOBT) of the stool. You may have this test every year starting at age 74.  Flexible sigmoidoscopy or colonoscopy. You may have a sigmoidoscopy every 5 years or a colonoscopy every 10 years starting at age 28.  Prostate cancer screening. Recommendations will vary depending on your family history and other risks.  Hepatitis C blood test.  Hepatitis B blood test.  Sexually transmitted disease (STD) testing.  Diabetes screening. This is done by checking your blood sugar (glucose) after you have not eaten for a while (fasting). You may have this done every 1-3 years.  Abdominal aortic aneurysm (AAA) screening. You may need this if you are a current or former smoker.  Osteoporosis. You may be screened starting at age 23 if you are at high risk. Talk with your health care  provider about your test results, treatment options, and if necessary, the need for more tests. Vaccines  Your health care provider may recommend certain vaccines, such as:  Influenza vaccine. This is recommended every year.  Tetanus,  diphtheria, and acellular pertussis (Tdap, Td) vaccine. You may need a Td booster every 10 years.  Zoster vaccine. You may need this after age 22.  Pneumococcal 13-valent conjugate (PCV13) vaccine. One dose is recommended after age 18.  Pneumococcal polysaccharide (PPSV23) vaccine. One dose is recommended after age 72. Talk to your health care provider about which screenings and vaccines you need and how often you need them. This information is not intended to replace advice given to you by your health care provider. Make sure you discuss any questions you have with your health care provider. Document Released: 10/27/2015 Document Revised: 06/19/2016 Document Reviewed: 08/01/2015 Elsevier Interactive Patient Education  2017 [employer] Prevention in the Home Falls can cause injuries. They can happen to people of all ages. There are many things you can do to make your home safe and to help prevent falls. What can I do on the outside of my home?  Regularly fix the edges of walkways and driveways and fix any cracks.  Remove anything that might make you trip as you walk through a door, such as a raised step or threshold.  Trim any bushes or trees on the path to your home.  Use bright outdoor lighting.  Clear any walking paths of anything that might make someone trip, such as rocks or tools.  Regularly check to see if handrails are loose or broken. Make sure that both sides of any steps have handrails.  Any raised decks and porches should have guardrails on the edges.  Have any leaves, snow, or ice cleared regularly.  Use sand or salt on walking paths during winter.  Clean up any spills in your garage right away. This includes oil or grease spills. What can I do in the bathroom?  Use night lights.  Install grab bars by the toilet and in the tub and shower. Do not use towel bars as grab bars.  Use non-skid mats or decals in the tub or shower.  If you need to sit down in  the shower, use a plastic, non-slip stool.  Keep the floor dry. Clean up any water that spills on the floor as soon as it happens.  Remove soap buildup in the tub or shower regularly.  Attach bath mats securely with double-sided non-slip rug tape.  Do not have throw rugs and other things on the floor that can make you trip. What can I do in the bedroom?  Use night lights.  Make sure that you have a light by your bed that is easy to reach.  Do not use any sheets or blankets that are too big for your bed. They should not hang down onto the floor.  Have a firm chair that has side arms. You can use this for support while you get dressed.  Do not have throw rugs and other things on the floor that can make you trip. What can I do in the kitchen?  Clean up any spills right away.  Avoid walking on wet floors.  Keep items that you use a lot in easy-to-reach places.  If you need to reach something above you, use a strong step stool that has a grab bar.  Keep electrical cords out of the way.  Do not use floor polish  or wax that makes floors slippery. If you must use wax, use non-skid floor wax.  Do not have throw rugs and other things on the floor that can make you trip. What can I do with my stairs?  Do not leave any items on the stairs.  Make sure that there are handrails on both sides of the stairs and use them. Fix handrails that are broken or loose. Make sure that handrails are as long as the stairways.  Check any carpeting to make sure that it is firmly attached to the stairs. Fix any carpet that is loose or worn.  Avoid having throw rugs at the top or bottom of the stairs. If you do have throw rugs, attach them to the floor with carpet tape.  Make sure that you have a light switch at the top of the stairs and the bottom of the stairs. If you do not have them, ask someone to add them for you. What else can I do to help prevent falls?  Wear shoes that:  Do not have high  heels.  Have rubber bottoms.  Are comfortable and fit you well.  Are closed at the toe. Do not wear sandals.  If you use a stepladder:  Make sure that it is fully opened. Do not climb a closed stepladder.  Make sure that both sides of the stepladder are locked into place.  Ask someone to hold it for you, if possible.  Clearly mark and make sure that you can see:  Any grab bars or handrails.  First and last steps.  Where the edge of each step is.  Use tools that help you move around (mobility aids) if they are needed. These include:  Canes.  Walkers.  Scooters.  Crutches.  Turn on the lights when you go into a dark area. Replace any light bulbs as soon as they burn out.  Set up your furniture so you have a clear path. Avoid moving your furniture around.  If any of your floors are uneven, fix them.  If there are any pets around you, be aware of where they are.  Review your medicines with your doctor. Some medicines can make you feel dizzy. This can increase your chance of falling. Ask your doctor what other things that you can do to help prevent falls. This information is not intended to replace advice given to you by your health care provider. Make sure you discuss any questions you have with your health care provider. Document Released: 07/27/2009 Document Revised: 03/07/2016 Document Reviewed: 11/04/2014 Elsevier Interactive Patient Education  2017 [employer].

## 2019-07-01 ENCOUNTER — Encounter: Payer: PPO | Admitting: Internal Medicine

## 2019-07-05 ENCOUNTER — Ambulatory Visit (INDEPENDENT_AMBULATORY_CARE_PROVIDER_SITE_OTHER): Payer: Medicare HMO

## 2019-07-05 ENCOUNTER — Other Ambulatory Visit: Payer: Self-pay

## 2019-07-05 DIAGNOSIS — Z23 Encounter for immunization: Secondary | ICD-10-CM

## 2019-07-05 NOTE — Progress Notes (Signed)
Flu shot given

## 2019-07-22 ENCOUNTER — Encounter (INDEPENDENT_AMBULATORY_CARE_PROVIDER_SITE_OTHER): Payer: Medicare HMO | Admitting: Ophthalmology

## 2019-08-04 ENCOUNTER — Emergency Department: Payer: Medicare HMO

## 2019-08-04 ENCOUNTER — Other Ambulatory Visit: Payer: Self-pay

## 2019-08-04 ENCOUNTER — Inpatient Hospital Stay
Admission: EM | Admit: 2019-08-04 | Discharge: 2019-08-06 | DRG: 247 | Disposition: A | Discharge status: Other Acute Inpatient Hospital | Payer: Medicare HMO | Attending: Internal Medicine | Admitting: Internal Medicine

## 2019-08-04 DIAGNOSIS — I214 Non-ST elevation (NSTEMI) myocardial infarction: Secondary | ICD-10-CM | POA: Diagnosis not present

## 2019-08-04 DIAGNOSIS — I6523 Occlusion and stenosis of bilateral carotid arteries: Secondary | ICD-10-CM | POA: Diagnosis present

## 2019-08-04 DIAGNOSIS — Z20828 Contact with and (suspected) exposure to other viral communicable diseases: Secondary | ICD-10-CM | POA: Diagnosis not present

## 2019-08-04 DIAGNOSIS — Z888 Allergy status to other drugs, medicaments and biological substances status: Secondary | ICD-10-CM

## 2019-08-04 DIAGNOSIS — R079 Chest pain, unspecified: Secondary | ICD-10-CM

## 2019-08-04 DIAGNOSIS — E039 Hypothyroidism, unspecified: Secondary | ICD-10-CM | POA: Diagnosis present

## 2019-08-04 DIAGNOSIS — R0789 Other chest pain: Secondary | ICD-10-CM | POA: Diagnosis not present

## 2019-08-04 DIAGNOSIS — Z794 Long term (current) use of insulin: Secondary | ICD-10-CM

## 2019-08-04 DIAGNOSIS — I440 Atrioventricular block, first degree: Secondary | ICD-10-CM | POA: Diagnosis present

## 2019-08-04 DIAGNOSIS — I2510 Atherosclerotic heart disease of native coronary artery without angina pectoris: Secondary | ICD-10-CM | POA: Diagnosis present

## 2019-08-04 DIAGNOSIS — Z833 Family history of diabetes mellitus: Secondary | ICD-10-CM

## 2019-08-04 DIAGNOSIS — Y840 Cardiac catheterization as the cause of abnormal reaction of the patient, or of later complication, without mention of misadventure at the time of the procedure: Secondary | ICD-10-CM | POA: Diagnosis present

## 2019-08-04 DIAGNOSIS — I252 Old myocardial infarction: Secondary | ICD-10-CM

## 2019-08-04 DIAGNOSIS — I9751 Accidental puncture and laceration of a circulatory system organ or structure during a circulatory system procedure: Secondary | ICD-10-CM | POA: Diagnosis not present

## 2019-08-04 DIAGNOSIS — Z79890 Hormone replacement therapy (postmenopausal): Secondary | ICD-10-CM

## 2019-08-04 DIAGNOSIS — I4891 Unspecified atrial fibrillation: Secondary | ICD-10-CM | POA: Diagnosis not present

## 2019-08-04 DIAGNOSIS — Z87891 Personal history of nicotine dependence: Secondary | ICD-10-CM

## 2019-08-04 DIAGNOSIS — E785 Hyperlipidemia, unspecified: Secondary | ICD-10-CM | POA: Diagnosis present

## 2019-08-04 DIAGNOSIS — I10 Essential (primary) hypertension: Secondary | ICD-10-CM | POA: Diagnosis not present

## 2019-08-04 DIAGNOSIS — N179 Acute kidney failure, unspecified: Secondary | ICD-10-CM | POA: Diagnosis present

## 2019-08-04 DIAGNOSIS — Z8701 Personal history of pneumonia (recurrent): Secondary | ICD-10-CM

## 2019-08-04 DIAGNOSIS — E1122 Type 2 diabetes mellitus with diabetic chronic kidney disease: Secondary | ICD-10-CM | POA: Diagnosis present

## 2019-08-04 DIAGNOSIS — N184 Chronic kidney disease, stage 4 (severe): Secondary | ICD-10-CM | POA: Diagnosis present

## 2019-08-04 DIAGNOSIS — I129 Hypertensive chronic kidney disease with stage 1 through stage 4 chronic kidney disease, or unspecified chronic kidney disease: Secondary | ICD-10-CM | POA: Diagnosis present

## 2019-08-04 DIAGNOSIS — Z801 Family history of malignant neoplasm of trachea, bronchus and lung: Secondary | ICD-10-CM

## 2019-08-04 DIAGNOSIS — Z8673 Personal history of transient ischemic attack (TIA), and cerebral infarction without residual deficits: Secondary | ICD-10-CM

## 2019-08-04 DIAGNOSIS — Z955 Presence of coronary angioplasty implant and graft: Secondary | ICD-10-CM

## 2019-08-04 DIAGNOSIS — Z7982 Long term (current) use of aspirin: Secondary | ICD-10-CM

## 2019-08-04 DIAGNOSIS — Z7902 Long term (current) use of antithrombotics/antiplatelets: Secondary | ICD-10-CM

## 2019-08-04 DIAGNOSIS — E1151 Type 2 diabetes mellitus with diabetic peripheral angiopathy without gangrene: Secondary | ICD-10-CM | POA: Diagnosis present

## 2019-08-04 DIAGNOSIS — Z66 Do not resuscitate: Secondary | ICD-10-CM | POA: Diagnosis present

## 2019-08-04 DIAGNOSIS — Z8249 Family history of ischemic heart disease and other diseases of the circulatory system: Secondary | ICD-10-CM

## 2019-08-04 DIAGNOSIS — E1165 Type 2 diabetes mellitus with hyperglycemia: Secondary | ICD-10-CM | POA: Diagnosis not present

## 2019-08-04 LAB — CBC
HCT: 36 % — ABNORMAL LOW (ref 39.0–52.0)
Hemoglobin: 11.7 g/dL — ABNORMAL LOW (ref 13.0–17.0)
MCH: 32 pg (ref 26.0–34.0)
MCHC: 32.5 g/dL (ref 30.0–36.0)
MCV: 98.4 fL (ref 80.0–100.0)
Platelets: 129 10*3/uL — ABNORMAL LOW (ref 150–400)
RBC: 3.66 MIL/uL — ABNORMAL LOW (ref 4.22–5.81)
RDW: 15.9 % — ABNORMAL HIGH (ref 11.5–15.5)
WBC: 8.6 10*3/uL (ref 4.0–10.5)
nRBC: 0 % (ref 0.0–0.2)

## 2019-08-04 LAB — BASIC METABOLIC PANEL
Anion gap: 8 (ref 5–15)
BUN: 60 mg/dL — ABNORMAL HIGH (ref 8–23)
CO2: 21 mmol/L — ABNORMAL LOW (ref 22–32)
Calcium: 8.3 mg/dL — ABNORMAL LOW (ref 8.9–10.3)
Chloride: 107 mmol/L (ref 98–111)
Creatinine, Ser: 5.91 mg/dL — ABNORMAL HIGH (ref 0.61–1.24)
GFR calc Af Amer: 10 mL/min — ABNORMAL LOW (ref >60)
GFR calc non Af Amer: 8 mL/min — ABNORMAL LOW (ref >60)
Glucose, Bld: 336 mg/dL — ABNORMAL HIGH (ref 70–99)
Potassium: 5.2 mmol/L — ABNORMAL HIGH (ref 3.5–5.1)
Sodium: 136 mmol/L (ref 135–145)

## 2019-08-04 LAB — TROPONIN I (HIGH SENSITIVITY): Troponin I (High Sensitivity): 471 ng/L (ref <18)

## 2019-08-04 NOTE — ED Provider Notes (Signed)
[facility] Emergency Department Provider Note  ____________________________________________  Time seen: Approximately 11:47 PM  I have reviewed the triage vital signs and the nursing notes.   HISTORY  Chief Complaint Chest Pain   HPI [name] is a 78 y.o. male with a history of CABG in 1997, diabetes, chronic kidney disease, CVA on Plavix, hypertension who presents for evaluation of chest pain.  Patient reports that he was eating dinner when he developed sudden onset of severe chest ache.  Patient called 911 who told him to take a full aspirin.  He did that.  EMS was called to the home and after 2 sublingual nitros the pain went away.  Patient is asymptomatic at this time.  Patient denies ever having similar pain since having his CABG in 1997.  At this time he has no chest pain, no headache, no shortness of breath, no abdominal or back pain.  Patient denies having nausea, vomiting, dizziness, shortness of breath, or diaphoresis associated with this episode of chest pain.   Past Medical History:  Diagnosis Date  . Blood transfusion without reported diagnosis    patient unaware of receiving blood unless it was during surgery and he was not told  . Cancer [facility]) 2001   Colon resection  . CAP (community acquired pneumonia) 08/11/2017  . Carotid stenosis, symptomatic, with infarction ([place]) 07/17/2017  . Chronic kidney disease 11/2017   stage IV CKD per nephrologist.  . Complication of anesthesia    raspy voice since carotid endarterectomy 07/17/17. paralysis of vocal chords  . Coronary artery disease   . Diabetes mellitus without complication ([place])   . Dysrhythmia    patient unaware of any irregular heart rhythms  . Hyperlipidemia   . Hypertension   . Hypothyroidism   . Kidney mass 2019  . Myocardial infarction ([place]) 1997  . Peripheral vascular disease ([place])   . Pneumonia 2014, 2018   developed after surgery 2018  . Stroke ([place]) 07/2017   mild  stroke and then had carotid endarterectomy  . Thyroid disease   . Wears dentures    full upper  . Weight loss 2019   patient has lost over 60 pounds since 07/2017.     Patient Active Problem List   Diagnosis Date Noted  . Hematochezia   . Benign neoplasm of transverse colon   . Benign neoplasm of descending colon   . Hypertension 03/27/2018  . Metabolic acidosis 70/35/0093  . Secondary hyperparathyroidism of renal origin ([place]) 01/22/2018  . Vitamin D deficiency 01/22/2018  . Hematuria 11/14/2017  . Urinary retention 08/28/2017  . Closed wedge compression fracture of twelfth thoracic vertebra ([place]) 08/11/2017  . Bilateral carotid artery stenosis 08/01/2017  . Hoarseness of voice 08/01/2017  . History of compression fracture of spine 07/28/2017  . History of stroke 06/11/2017  . Chronic shoulder bursitis, left 07/22/2016  . Cholecystitis, acute 04/21/2016  . 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 CAD in native artery 01/20/2015  . Gout 01/20/2015  . Diabetic peripheral neuropathy ([place]) 01/20/2015  . H/O malignant neoplasm of colon 01/20/2015  . Adult hypothyroidism 01/20/2015  . Hyperlipidemia associated with type 2 diabetes mellitus ([place]) 01/20/2015  . High potassium 05/19/2014  . 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 Hypertension in stage 4 chronic kidney disease due to type 2 diabetes mellitus ([place]) 07/15/2013  Past Surgical History:  Procedure Laterality Date  . APPENDECTOMY  2011  . BACK SURGERY    . CARDIAC CATHETERIZATION  1997  . CAROTID ANGIOGRAPHY Right 06/13/2017   Procedure: Right subclavian and Carotid Angiography, possible intervention;  Surgeon: Algernon Huxley, MD;  Location: [place] [facility];  Service: Cardiovascular;  Laterality: Right;  . CATARACT EXTRACTION, BILATERAL    .  CHOLECYSTECTOMY N/A 04/23/2016   had infection post surgery requiring him to debride daily  . 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 COLONOSCOPY WITH PROPOFOL N/A 10/26/2018   Procedure: COLONOSCOPY WITH PROPOFOL;  Surgeon: Lucilla Lame, MD;  Location: [place];  Service: Endoscopy;  Laterality: N/A;  Diabetic - insulin  . CORONARY ARTERY BYPASS GRAFT  1997   x 3  . CYSTOSCOPY W/ RETROGRADES Bilateral 09/03/2017   Procedure: CYSTOSCOPY WITH RETROGRADE PYELOGRAM;  Surgeon: Hollice Espy, MD;  Location: [facility];  Service: Urology;  Laterality: Bilateral;  . CYSTOSCOPY W/ RETROGRADES Right 01/28/2018   Procedure: CYSTOSCOPY WITH RETROGRADE PYELOGRAM;  Surgeon: Hollice Espy, MD;  Location: [facility];  Service: Urology;  Laterality: Right;  . CYSTOSCOPY W/ URETERAL STENT REMOVAL  08/2017  . CYSTOSCOPY WITH BIOPSY Right 01/28/2018   Procedure: CYSTOSCOPY WITH BIOPSY;  Surgeon: Hollice Espy, MD;  Location: [facility];  Service: Urology;  Laterality: Right;  . CYSTOSCOPY WITH STENT PLACEMENT Right 09/03/2017   Procedure: CYSTOSCOPY WITH STENT PLACEMENT;  Surgeon: Hollice Espy, MD;  Location: [facility];  Service: Urology;  Laterality: Right;  . CYSTOSCOPY WITH STENT PLACEMENT Right 01/28/2018   Procedure: CYSTOSCOPY WITH STENT PLACEMENT;  Surgeon: Hollice Espy, MD;  Location: [facility];  Service: Urology;  Laterality: Right;  . CYSTOSCOPY WITH URETEROSCOPY Right 01/28/2018   Procedure: CYSTOSCOPY WITH URETEROSCOPY;  Surgeon: Hollice Espy, MD;  Location: [facility];  Service: Urology;  Laterality: Right;  . ENDARTERECTOMY Right 07/17/2017   Procedure: ENDARTERECTOMY CAROTID;  Surgeon: Algernon Huxley, MD;  Location: [facility];  Service: Vascular;  Laterality: Right;  . HERNIA REPAIR  2011   Ventral hernia  . HOLMIUM LASER APPLICATION N/A 65/68/1275   Procedure: HOLMIUM LASER APPLICATION;  Surgeon: Hollice Espy, MD;  Location: [facility];   Service: Urology;  Laterality: N/A;  . KNEE SURGERY Left    arthroscopy  . KYPHOPLASTY N/A 08/14/2017   Procedure: [id number];  Surgeon: Hessie Knows, MD;  Location: [facility];  Service: Orthopedics;  Laterality: N/A;  . LARYNX SURGERY    . POLYPECTOMY  10/26/2018   Procedure: POLYPECTOMY INTESTINAL;  Surgeon: Lucilla Lame, MD;  Location: [place];  Service: Endoscopy;;  Descending colon polyp Transverse colon polyps x 3  . PROSTATE SURGERY  2002   BPH benign pathology  . SPINE SURGERY  1989   Lumbar disc  . THYROPLASTY Right 01/05/2018   Procedure: THYROPLASTY;  Surgeon: Beverly Gust, MD;  Location: [facility];  Service: ENT;  Laterality: Right;    Prior to Admission medications   Medication Sig Start Date End Date Taking? Authorizing Provider  atenolol (TENORMIN) 50 MG tablet TAKE 1 TABLET BY MOUTH ONCE DAILY Patient taking differently: Take 50 mg by mouth daily.  10/30/18  Yes Glean Hess, MD  calcitRIOL (ROCALTROL) 0.5 MCG capsule Take 0.5 mcg by mouth daily. 07/05/19  Yes [provider]  clopidogrel (PLAVIX) 75 MG tablet Take 1 tablet by mouth once daily 05/19/19  Yes Glean Hess, MD  levothyroxine (SYNTHROID, LEVOTHROID) 300 MCG tablet  Take 300 mcg by mouth daily before breakfast.    Yes [provider]  sodium bicarbonate 650 MG tablet Take 650 mg by mouth 2 (two) times daily. 06/19/18  Yes [provider]  acetaminophen (TYLENOL) 500 MG tablet Take 1,000 mg every 6 (six) hours as needed by mouth for moderate pain or headache.    [provider]  atorvastatin (LIPITOR) 40 MG tablet TAKE 1 TABLET BY MOUTH ONCE DAILY AT  6PM Patient taking differently: Take 40 mg by mouth daily at 6 PM.  12/03/18   Glean Hess, MD  Blood Glucose Monitoring Suppl (FIFTY50 GLUCOSE METER 2.0) w/Device KIT 1 Device by Other route 2 (two) times daily. 12/08/17   [provider]  EQ ASPIRIN ADULT LOW DOSE 81 MG EC tablet TAKE 1  BY  MOUTH ONCE DAILY Patient taking differently: Take 81 mg by mouth daily.  11/04/18   Algernon Huxley, MD  glucose blood (RELION PRIME TEST) test strip 1 strip by Other route 2 (two) times daily. 07/09/17   [provider]  insulin NPH Human (HUMULIN N,NOVOLIN N) 100 UNIT/ML injection Inject 0.25 mLs (25 Units total) into the skin 2 (two) times daily. Patient taking differently: Inject 30 Units into the skin 2 (two) times daily.  08/15/17   Vaughan Basta, MD  insulin regular (NOVOLIN R,HUMULIN R) 100 units/mL injection Inject 14 Units into the skin 2 (two) times daily. 15 units in the morning & 10 units in the evening with supper    [provider]  valACYclovir (VALTREX) 1000 MG tablet Take 1 tablet (1,000 mg total) by mouth 3 (three) times daily. 10/16/18   Glean Hess, MD    Allergies Ace inhibitors and Quinapril  Family History  Problem Relation Age of Onset  . Diabetes Mother   . Heart disease Mother   . Heart disease Father   . Cancer Father        lung    Social History Social History   Tobacco Use  . Smoking status: Former Smoker    Packs/day: 1.50    Years: 44.00    Pack years: 66.00    Types: Cigarettes    Quit date: 06/24/1996    Years since quitting: 23.1  . Smokeless tobacco: Never Used  . Tobacco comment: smoking cessation materials not required  Substance Use Topics  . Alcohol use: Yes    Alcohol/week: 0.0 standard drinks    Comment: rare  . Drug use: No    Review of Systems  Constitutional: Negative for fever. Eyes: Negative for visual changes. ENT: Negative for sore throat. Neck: No neck pain  Cardiovascular: +chest pain. Respiratory: Negative for shortness of breath. Gastrointestinal: Negative for abdominal pain, vomiting or diarrhea. Genitourinary: Negative for dysuria. Musculoskeletal: Negative for back pain. Skin: Negative for rash. Neurological: Negative for headaches, weakness or numbness. Psych: No SI or HI   ____________________________________________   PHYSICAL EXAM:  VITAL SIGNS: ED Triage Vitals  Enc Vitals Group     BP 08/04/19 2251 (!) 170/76     Pulse Rate 08/04/19 2251 62     Resp 08/04/19 2251 13     Temp 08/04/19 2253 98.1 F (36.7 C)     Temp Source 08/04/19 2253 Oral     SpO2 08/04/19 2251 (!) 87 %     Weight 08/04/19 2251 215 lb (97.5 kg)     Height 08/04/19 2251 6' 2"  (1.88 m)     Head Circumference --  Peak Flow --      Pain Score 08/04/19 2251 1     Pain Loc --      Pain Edu? --      Excl. in [place]? --     Constitutional: Alert and oriented. Well appearing and in no apparent distress. HEENT:      Head: Normocephalic and atraumatic.         Eyes: Conjunctivae are normal. Sclera is non-icteric.       Mouth/Throat: Mucous membranes are moist.       Neck: Supple with no signs of meningismus. Cardiovascular: Regular rate and rhythm. No murmurs, gallops, or rubs. 2+ symmetrical distal pulses are present in all extremities. No JVD. Respiratory: Normal respiratory effort. Lungs are clear to auscultation bilaterally. No wheezes, crackles, or rhonchi.  Gastrointestinal: Soft, non tender, and non distended with positive bowel sounds. No rebound or guarding. Musculoskeletal: Nontender with normal range of motion in all extremities. No edema, cyanosis, or erythema of extremities. Neurologic: Normal speech and language. Face is symmetric. Moving all extremities. No gross focal neurologic deficits are appreciated. Skin: Skin is warm, dry and intact. No rash noted. Psychiatric: Mood and affect are normal. Speech and behavior are normal.  ____________________________________________   LABS (all labs ordered are listed, but only abnormal results are displayed)  Labs Reviewed  BASIC METABOLIC PANEL - Abnormal; Notable for the following components:      Result Value   Potassium 5.2 (*)    CO2 21 (*)    Glucose, Bld 336 (*)    BUN 60 (*)    Creatinine, Ser 5.91 (*)     Calcium 8.3 (*)    GFR calc non Af Amer 8 (*)    GFR calc Af Amer 10 (*)    All other components within normal limits  CBC - Abnormal; Notable for the following components:   RBC 3.66 (*)    Hemoglobin 11.7 (*)    HCT 36.0 (*)    RDW 15.9 (*)    Platelets 129 (*)    All other components within normal limits  TROPONIN I (HIGH SENSITIVITY) - Abnormal; Notable for the following components:   Troponin I (High Sensitivity) 471 (*)    All other components within normal limits   ____________________________________________  EKG  ED ECG REPORT I, Rudene Re, the attending physician, personally viewed and interpreted this ECG.   Normal sinus rhythm, rate of 64, normal intervals, normal axis, minimal ST depressions on lateral leads with no ST elevation.  These are new when compared to prior ____________________________________________  RADIOLOGY  I have personally reviewed the images performed during this visit and I agree with the Radiologist's read.   Interpretation by Radiologist:  No results found.    ____________________________________________   PROCEDURES  Procedure(s) performed: None Procedures Critical Care performed: yes  CRITICAL CARE Performed by: Rudene Re  ?  Total critical care time: 35 min  Critical care time was exclusive of separately billable procedures and treating other patients.  Critical care was necessary to treat or prevent imminent or life-threatening deterioration.  Critical care was time spent personally by me on the following activities: development of treatment plan with patient and/or surrogate as well as nursing, discussions with consultants, evaluation of patient's response to treatment, examination of patient, obtaining history from patient or surrogate, ordering and performing treatments and interventions, ordering and review of laboratory studies, ordering and review of radiographic studies, pulse oximetry and  re-evaluation of patient's condition.  ____________________________________________  INITIAL IMPRESSION / ASSESSMENT AND PLAN / ED COURSE  78 y.o. male with a history of CABG in 1997, diabetes, chronic kidney disease, CVA on Plavix, hypertension who presents for evaluation of chest pain.  Patient is pain-free at this time after receiving 2 nitros per EMS.  He took a full dose aspirin at the onset of the pain.  His initial EKG shows minimal ST elevations on the lateral leads with a flipped T wave in V6 which are new when compared to prior.  Does not meet STEMI criteria.  First troponin is elevated at 471 concerning for NSTEMI.  Will start patient on heparin.  Discussed with the hospitalist for admission.       As part of my medical decision making, I reviewed the following data within the [facility] notes reviewed and incorporated, Labs reviewed , EKG interpreted , Old EKG reviewed, Old chart reviewed, Radiograph reviewed , Discussed with admitting physician , Notes from prior ED visits and [place] Controlled Substance Database   Patient was evaluated in Emergency Department today for the symptoms described in the history of present illness. Patient was evaluated in the context of the global COVID-19 pandemic, which necessitated consideration that the patient might be at risk for infection with the SARS-CoV-2 virus that causes COVID-19. Institutional protocols and algorithms that pertain to the evaluation of patients at risk for COVID-19 are in a state of rapid change based on information released by regulatory bodies including the CDC and federal and state organizations. These policies and algorithms were followed during the patient's care in the ED.   ____________________________________________   FINAL CLINICAL IMPRESSION(S) / ED DIAGNOSES   Final diagnoses:  NSTEMI (non-ST elevated myocardial infarction) ([place])      NEW MEDICATIONS STARTED DURING THIS VISIT:   ED Discharge Orders    None       Note:  This document was prepared using Dragon voice recognition software and may include unintentional dictation errors.    Rudene Re, MD 08/04/19 [phone]

## 2019-08-04 NOTE — ED Triage Notes (Signed)
Pt arrived via [facility] from home with chest pain that started at dinner time. Pt has hx of CABG. Pt was reding a fib with ems but he does not have a hx of a-fib. Pt took 324 of asa with a nitro given by ems. Pt now states that pain is a 1/10. Pt alert and oriented.

## 2019-08-05 ENCOUNTER — Inpatient Hospital Stay
Admit: 2019-08-05 | Discharge: 2019-08-05 | Disposition: A | Discharge status: Home / Self Care | Payer: Medicare HMO | Attending: Nurse Practitioner | Admitting: Nurse Practitioner

## 2019-08-05 ENCOUNTER — Other Ambulatory Visit: Payer: Self-pay

## 2019-08-05 DIAGNOSIS — Z801 Family history of malignant neoplasm of trachea, bronchus and lung: Secondary | ICD-10-CM | POA: Diagnosis not present

## 2019-08-05 DIAGNOSIS — R410 Disorientation, unspecified: Secondary | ICD-10-CM | POA: Diagnosis not present

## 2019-08-05 DIAGNOSIS — I6523 Occlusion and stenosis of bilateral carotid arteries: Secondary | ICD-10-CM | POA: Diagnosis present

## 2019-08-05 DIAGNOSIS — Z87891 Personal history of nicotine dependence: Secondary | ICD-10-CM | POA: Diagnosis not present

## 2019-08-05 DIAGNOSIS — M542 Cervicalgia: Secondary | ICD-10-CM | POA: Diagnosis not present

## 2019-08-05 DIAGNOSIS — Z833 Family history of diabetes mellitus: Secondary | ICD-10-CM | POA: Diagnosis not present

## 2019-08-05 DIAGNOSIS — I2510 Atherosclerotic heart disease of native coronary artery without angina pectoris: Secondary | ICD-10-CM | POA: Diagnosis present

## 2019-08-05 DIAGNOSIS — I25810 Atherosclerosis of coronary artery bypass graft(s) without angina pectoris: Secondary | ICD-10-CM | POA: Diagnosis not present

## 2019-08-05 DIAGNOSIS — Z794 Long term (current) use of insulin: Secondary | ICD-10-CM | POA: Diagnosis not present

## 2019-08-05 DIAGNOSIS — I351 Nonrheumatic aortic (valve) insufficiency: Secondary | ICD-10-CM | POA: Diagnosis not present

## 2019-08-05 DIAGNOSIS — I4891 Unspecified atrial fibrillation: Secondary | ICD-10-CM | POA: Diagnosis present

## 2019-08-05 DIAGNOSIS — Z888 Allergy status to other drugs, medicaments and biological substances status: Secondary | ICD-10-CM | POA: Diagnosis not present

## 2019-08-05 DIAGNOSIS — N186 End stage renal disease: Secondary | ICD-10-CM | POA: Diagnosis not present

## 2019-08-05 DIAGNOSIS — I252 Old myocardial infarction: Secondary | ICD-10-CM | POA: Diagnosis not present

## 2019-08-05 DIAGNOSIS — R040 Epistaxis: Secondary | ICD-10-CM | POA: Diagnosis not present

## 2019-08-05 DIAGNOSIS — N179 Acute kidney failure, unspecified: Secondary | ICD-10-CM | POA: Diagnosis not present

## 2019-08-05 DIAGNOSIS — I440 Atrioventricular block, first degree: Secondary | ICD-10-CM | POA: Diagnosis present

## 2019-08-05 DIAGNOSIS — Z8701 Personal history of pneumonia (recurrent): Secondary | ICD-10-CM | POA: Diagnosis not present

## 2019-08-05 DIAGNOSIS — E039 Hypothyroidism, unspecified: Secondary | ICD-10-CM | POA: Diagnosis present

## 2019-08-05 DIAGNOSIS — J9601 Acute respiratory failure with hypoxia: Secondary | ICD-10-CM | POA: Diagnosis not present

## 2019-08-05 DIAGNOSIS — E1169 Type 2 diabetes mellitus with other specified complication: Secondary | ICD-10-CM | POA: Diagnosis not present

## 2019-08-05 DIAGNOSIS — Z955 Presence of coronary angioplasty implant and graft: Secondary | ICD-10-CM | POA: Diagnosis not present

## 2019-08-05 DIAGNOSIS — E785 Hyperlipidemia, unspecified: Secondary | ICD-10-CM | POA: Diagnosis present

## 2019-08-05 DIAGNOSIS — Z8249 Family history of ischemic heart disease and other diseases of the circulatory system: Secondary | ICD-10-CM | POA: Diagnosis not present

## 2019-08-05 DIAGNOSIS — I129 Hypertensive chronic kidney disease with stage 1 through stage 4 chronic kidney disease, or unspecified chronic kidney disease: Secondary | ICD-10-CM | POA: Diagnosis not present

## 2019-08-05 DIAGNOSIS — E1151 Type 2 diabetes mellitus with diabetic peripheral angiopathy without gangrene: Secondary | ICD-10-CM | POA: Diagnosis not present

## 2019-08-05 DIAGNOSIS — I214 Non-ST elevation (NSTEMI) myocardial infarction: Secondary | ICD-10-CM | POA: Diagnosis not present

## 2019-08-05 DIAGNOSIS — I959 Hypotension, unspecified: Secondary | ICD-10-CM | POA: Diagnosis not present

## 2019-08-05 DIAGNOSIS — I10 Essential (primary) hypertension: Secondary | ICD-10-CM | POA: Diagnosis not present

## 2019-08-05 DIAGNOSIS — Z8673 Personal history of transient ischemic attack (TIA), and cerebral infarction without residual deficits: Secondary | ICD-10-CM | POA: Diagnosis not present

## 2019-08-05 DIAGNOSIS — I9751 Accidental puncture and laceration of a circulatory system organ or structure during a circulatory system procedure: Secondary | ICD-10-CM | POA: Diagnosis not present

## 2019-08-05 DIAGNOSIS — I361 Nonrheumatic tricuspid (valve) insufficiency: Secondary | ICD-10-CM | POA: Diagnosis not present

## 2019-08-05 DIAGNOSIS — Z20828 Contact with and (suspected) exposure to other viral communicable diseases: Secondary | ICD-10-CM | POA: Diagnosis not present

## 2019-08-05 DIAGNOSIS — E1165 Type 2 diabetes mellitus with hyperglycemia: Secondary | ICD-10-CM | POA: Diagnosis not present

## 2019-08-05 DIAGNOSIS — Z66 Do not resuscitate: Secondary | ICD-10-CM | POA: Diagnosis not present

## 2019-08-05 DIAGNOSIS — I200 Unstable angina: Secondary | ICD-10-CM | POA: Diagnosis not present

## 2019-08-05 DIAGNOSIS — Y840 Cardiac catheterization as the cause of abnormal reaction of the patient, or of later complication, without mention of misadventure at the time of the procedure: Secondary | ICD-10-CM | POA: Diagnosis not present

## 2019-08-05 DIAGNOSIS — R079 Chest pain, unspecified: Secondary | ICD-10-CM | POA: Diagnosis present

## 2019-08-05 DIAGNOSIS — E1122 Type 2 diabetes mellitus with diabetic chronic kidney disease: Secondary | ICD-10-CM | POA: Diagnosis not present

## 2019-08-05 DIAGNOSIS — N184 Chronic kidney disease, stage 4 (severe): Secondary | ICD-10-CM | POA: Diagnosis not present

## 2019-08-05 DIAGNOSIS — Z992 Dependence on renal dialysis: Secondary | ICD-10-CM | POA: Diagnosis not present

## 2019-08-05 DIAGNOSIS — J810 Acute pulmonary edema: Secondary | ICD-10-CM | POA: Diagnosis not present

## 2019-08-05 LAB — GLUCOSE, CAPILLARY
Glucose-Capillary: 205 mg/dL — ABNORMAL HIGH (ref 70–99)
Glucose-Capillary: 150 mg/dL — ABNORMAL HIGH (ref 70–99)
Glucose-Capillary: 163 mg/dL — ABNORMAL HIGH (ref 70–99)
Glucose-Capillary: 216 mg/dL — ABNORMAL HIGH (ref 70–99)
Glucose-Capillary: 213 mg/dL — ABNORMAL HIGH (ref 70–99)

## 2019-08-05 LAB — BASIC METABOLIC PANEL
Anion gap: 9 (ref 5–15)
BUN: 58 mg/dL — ABNORMAL HIGH (ref 8–23)
CO2: 18 mmol/L — ABNORMAL LOW (ref 22–32)
Calcium: 8.2 mg/dL — ABNORMAL LOW (ref 8.9–10.3)
Chloride: 111 mmol/L (ref 98–111)
Creatinine, Ser: 5.72 mg/dL — ABNORMAL HIGH (ref 0.61–1.24)
GFR calc Af Amer: 10 mL/min — ABNORMAL LOW (ref >60)
GFR calc non Af Amer: 9 mL/min — ABNORMAL LOW (ref >60)
Glucose, Bld: 210 mg/dL — ABNORMAL HIGH (ref 70–99)
Potassium: 4.5 mmol/L (ref 3.5–5.1)
Sodium: 138 mmol/L (ref 135–145)

## 2019-08-05 LAB — LIPID PANEL
Cholesterol: 131 mg/dL (ref 0–200)
HDL: 18 mg/dL — ABNORMAL LOW (ref >40)
LDL Cholesterol: 82 mg/dL (ref 0–99)
Total CHOL/HDL Ratio: 7.3 RATIO
Triglycerides: 156 mg/dL — ABNORMAL HIGH (ref <150)
VLDL: 31 mg/dL (ref 0–40)

## 2019-08-05 LAB — CBC
HCT: 35.6 % — ABNORMAL LOW (ref 39.0–52.0)
Hemoglobin: 11.6 g/dL — ABNORMAL LOW (ref 13.0–17.0)
MCH: 32 pg (ref 26.0–34.0)
MCHC: 32.6 g/dL (ref 30.0–36.0)
MCV: 98.1 fL (ref 80.0–100.0)
Platelets: 121 10*3/uL — ABNORMAL LOW (ref 150–400)
RBC: 3.63 MIL/uL — ABNORMAL LOW (ref 4.22–5.81)
RDW: 15.8 % — ABNORMAL HIGH (ref 11.5–15.5)
WBC: 8.1 10*3/uL (ref 4.0–10.5)
nRBC: 0 % (ref 0.0–0.2)

## 2019-08-05 LAB — SARS CORONAVIRUS 2 BY RT PCR (HOSPITAL ORDER, PERFORMED IN ~~LOC~~ HOSPITAL LAB): SARS Coronavirus 2: NEGATIVE

## 2019-08-05 LAB — HEMOGLOBIN A1C
Hgb A1c MFr Bld: 8.5 % — ABNORMAL HIGH (ref 4.8–5.6)
Mean Plasma Glucose: 197.25 mg/dL

## 2019-08-05 LAB — TSH: TSH: 2.728 u[IU]/mL (ref 0.350–4.500)

## 2019-08-05 LAB — ECHOCARDIOGRAM COMPLETE
Height: 74 in
Weight: 3440 oz

## 2019-08-05 LAB — TROPONIN I (HIGH SENSITIVITY)
Troponin I (High Sensitivity): 507 ng/L (ref <18)
Troponin I (High Sensitivity): 1266 ng/L (ref <18)
Troponin I (High Sensitivity): 1123 ng/L (ref <18)

## 2019-08-05 LAB — HEPARIN LEVEL (UNFRACTIONATED)
Heparin Unfractionated: 0.52 IU/mL (ref 0.30–0.70)
Heparin Unfractionated: 0.5 IU/mL (ref 0.30–0.70)

## 2019-08-05 LAB — APTT: aPTT: 33 seconds (ref 24–36)

## 2019-08-05 LAB — PROTIME-INR
INR: 1.1 (ref 0.8–1.2)
Prothrombin Time: 13.8 seconds (ref 11.4–15.2)

## 2019-08-05 MED ORDER — VALACYCLOVIR HCL 500 MG PO TABS
1000.0000 mg | ORAL_TABLET | Freq: Three times a day (TID) | ORAL | Status: DC
  Administered 2019-08-05 – 2019-08-06 (×4): 1000 mg via ORAL
  Filled 2019-08-05 (×6): qty 2

## 2019-08-05 MED ORDER — ACETAMINOPHEN 500 MG PO TABS: 1000.0000 mg | ORAL_TABLET | Freq: Four times a day (QID) | ORAL | Status: DC | PRN

## 2019-08-05 MED ORDER — ATENOLOL 50 MG PO TABS
50.0000 mg | ORAL_TABLET | Freq: Every day | ORAL | Status: DC
  Filled 2019-08-05: qty 1

## 2019-08-05 MED ORDER — NITROGLYCERIN 0.4 MG SL SUBL
0.4000 mg | SUBLINGUAL_TABLET | SUBLINGUAL | Status: AC | PRN
  Administered 2019-08-05 (×3): 0.4 mg via SUBLINGUAL
  Filled 2019-08-05: qty 1

## 2019-08-05 MED ORDER — ATORVASTATIN CALCIUM 20 MG PO TABS
80.0000 mg | ORAL_TABLET | Freq: Every day | ORAL | Status: DC
  Administered 2019-08-05: 80 mg via ORAL
  Filled 2019-08-05: qty 1

## 2019-08-05 MED ORDER — CLOPIDOGREL BISULFATE 75 MG PO TABS
75.0000 mg | ORAL_TABLET | Freq: Every day | ORAL | Status: DC
  Administered 2019-08-05 – 2019-08-06 (×2): 75 mg via ORAL
  Filled 2019-08-05 (×2): qty 1

## 2019-08-05 MED ORDER — LEVOTHYROXINE SODIUM 100 MCG PO TABS
300.0000 ug | ORAL_TABLET | Freq: Every day | ORAL | Status: DC
  Administered 2019-08-05 – 2019-08-06 (×2): 300 ug via ORAL
  Filled 2019-08-05 (×2): qty 3

## 2019-08-05 MED ORDER — HYDRALAZINE HCL 50 MG PO TABS: 50.0000 mg | ORAL_TABLET | Freq: Three times a day (TID) | ORAL | Status: DC

## 2019-08-05 MED ORDER — SODIUM BICARBONATE 650 MG PO TABS
650.0000 mg | ORAL_TABLET | Freq: Two times a day (BID) | ORAL | Status: DC
  Administered 2019-08-05 – 2019-08-06 (×3): 650 mg via ORAL
  Filled 2019-08-05 (×4): qty 1

## 2019-08-05 MED ORDER — HYDRALAZINE HCL 50 MG PO TABS
50.0000 mg | ORAL_TABLET | Freq: Once | ORAL | Status: AC
  Administered 2019-08-05: 50 mg via ORAL
  Filled 2019-08-05: qty 1

## 2019-08-05 MED ORDER — ASPIRIN EC 81 MG PO TBEC
81.0000 mg | DELAYED_RELEASE_TABLET | Freq: Every day | ORAL | Status: DC
  Administered 2019-08-06: 81 mg via ORAL
  Filled 2019-08-05: qty 1

## 2019-08-05 MED ORDER — INSULIN ASPART 100 UNIT/ML ~~LOC~~ SOLN
0.0000 [IU] | Freq: Three times a day (TID) | SUBCUTANEOUS | Status: DC
  Administered 2019-08-05: 1 [IU] via SUBCUTANEOUS
  Administered 2019-08-05: 2 [IU] via SUBCUTANEOUS
  Administered 2019-08-05 – 2019-08-06 (×2): 3 [IU] via SUBCUTANEOUS
  Administered 2019-08-06: 2 [IU] via SUBCUTANEOUS
  Administered 2019-08-06: 3 [IU] via SUBCUTANEOUS
  Filled 2019-08-05 (×6): qty 1

## 2019-08-05 MED ORDER — HEPARIN (PORCINE) 25000 UT/250ML-% IV SOLN
1350.0000 [IU]/h | INTRAVENOUS | Status: DC
  Administered 2019-08-05 – 2019-08-06 (×3): 1350 [IU]/h via INTRAVENOUS
  Filled 2019-08-05 (×3): qty 250

## 2019-08-05 MED ORDER — HEPARIN BOLUS VIA INFUSION
4000.0000 [IU] | Freq: Once | INTRAVENOUS | Status: AC
  Administered 2019-08-05: 4000 [IU] via INTRAVENOUS
  Filled 2019-08-05: qty 4000

## 2019-08-05 MED ORDER — CALCITRIOL 0.25 MCG PO CAPS
0.5000 ug | ORAL_CAPSULE | Freq: Every day | ORAL | Status: DC
  Administered 2019-08-05 – 2019-08-06 (×2): 0.5 ug via ORAL
  Filled 2019-08-05 (×2): qty 2

## 2019-08-05 MED ORDER — SODIUM CHLORIDE 0.9 % IV SOLN
INTRAVENOUS | Status: DC
  Administered 2019-08-05 (×2): via INTRAVENOUS

## 2019-08-05 MED ORDER — ONDANSETRON HCL 4 MG/2ML IJ SOLN: 4.0000 mg | Freq: Four times a day (QID) | INTRAMUSCULAR | Status: DC | PRN

## 2019-08-05 MED ORDER — ATORVASTATIN CALCIUM 20 MG PO TABS: 40.0000 mg | ORAL_TABLET | Freq: Every day | ORAL | Status: DC

## 2019-08-05 MED ORDER — INSULIN ASPART 100 UNIT/ML ~~LOC~~ SOLN
0.0000 [IU] | Freq: Every day | SUBCUTANEOUS | Status: DC
  Administered 2019-08-05 – 2019-08-06 (×3): 2 [IU] via SUBCUTANEOUS
  Filled 2019-08-05 (×3): qty 1

## 2019-08-05 MED ORDER — ATROPINE SULFATE 1 MG/10ML IJ SOSY: 0.5000 mg | PREFILLED_SYRINGE | INTRAMUSCULAR | Status: DC | PRN

## 2019-08-05 MED ORDER — HYDRALAZINE HCL 50 MG PO TABS
50.0000 mg | ORAL_TABLET | Freq: Three times a day (TID) | ORAL | Status: DC
  Administered 2019-08-05 – 2019-08-06 (×3): 50 mg via ORAL
  Filled 2019-08-05 (×4): qty 1

## 2019-08-05 NOTE — Progress Notes (Signed)
Pt c/o 5/10 chest pain and tightness associated with some sob/ no changes on tele/ VSS/ EKG obtained/ x3 SL nitro given/ 02 applied/ pt reports improvement  / rates pain 1/10 currently / Dr. Darvin Neighbours made aware/ MD stated that he would make Dr. Nehemiah Massed aware/ will monitor close

## 2019-08-05 NOTE — ED Notes (Addendum)
ED TO INPATIENT HANDOFF REPORT  ED Nurse Name and Phone #: Karena Addison [phone]  S Name/Age/Gender [name] 78 y.o. male Room/Bed: ED12A/ED12A  Code Status   Code Status: DNR  Home/SNF/Other Home Patient oriented to: self, place, time and situation Is this baseline? Yes   Triage Complete: Triage complete  Chief Complaint Chest Pain  Triage Note Pt arrived via [facility] from home with chest pain that started at dinner time. Pt has hx of CABG. Pt was reding a fib with ems but he does not have a hx of a-fib. Pt took 324 of asa with a nitro given by ems. Pt now states that pain is a 1/10. Pt alert and oriented.   Allergies Allergies  Allergen Reactions  . Ace Inhibitors Other (See Comments)    Reaction:  Raises potassium   . Quinapril Rash and Other (See Comments)    hyperkalemia    Level of Care/Admitting Diagnosis ED Disposition    ED Disposition Condition [facility]: [place] [[id number]]  Level of Care: Telemetry [5]  Covid Evaluation: Asymptomatic Screening Protocol (No Symptoms)  Diagnosis: Chest pain with high risk of acute coronary syndrome [[id number]]  Admitting Physician: Lang Snow [[id number]]  Attending Physician: Rufina Falco ACHIENG [[id number]]  Estimated length of stay: past midnight tomorrow  Certification:: I certify this patient will need inpatient services for at least 2 midnights  PT Class (Do Not Modify): Inpatient [101]  PT Acc Code (Do Not Modify): Private [1]       B Medical/Surgery History Past Medical History:  Diagnosis Date  . Blood transfusion without reported diagnosis    patient unaware of receiving blood unless it was during surgery and he was not told  . Cancer [facility]) 2001   Colon resection  . CAP (community acquired pneumonia) 08/11/2017  . Carotid stenosis, symptomatic, with infarction ([place]) 07/17/2017  . Chronic kidney disease 11/2017   stage IV CKD per nephrologist.  . Complication of  anesthesia    raspy voice since carotid endarterectomy 07/17/17. paralysis of vocal chords  . Coronary artery disease   . Diabetes mellitus without complication ([place])   . Dysrhythmia    patient unaware of any irregular heart rhythms  . Hyperlipidemia   . Hypertension   . Hypothyroidism   . Kidney mass 2019  . Myocardial infarction ([place]) 1997  . Peripheral vascular disease ([place])   . Pneumonia 2014, 2018   developed after surgery 2018  . Stroke ([place]) 07/2017   mild stroke and then had carotid endarterectomy  . Thyroid disease   . Wears dentures    full upper  . Weight loss 2019   patient has lost over 60 pounds since 07/2017.    Past Surgical History:  Procedure Laterality Date  . APPENDECTOMY  2011  . BACK SURGERY    . CARDIAC CATHETERIZATION  1997  . CAROTID ANGIOGRAPHY Right 06/13/2017   Procedure: Right subclavian and Carotid Angiography, possible intervention;  Surgeon: Algernon Huxley, MD;  Location: [place] [facility];  Service: Cardiovascular;  Laterality: Right;  . CATARACT EXTRACTION, BILATERAL    . CHOLECYSTECTOMY N/A 04/23/2016   had infection post surgery requiring him to debride daily  . 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 COLONOSCOPY WITH PROPOFOL N/A 10/26/2018   Procedure: COLONOSCOPY WITH PROPOFOL;  Surgeon: Lucilla Lame, MD;  Location: [place];  Service: Endoscopy;  Laterality: N/A;  Diabetic -  insulin  . CORONARY ARTERY BYPASS GRAFT  1997   x 3  . CYSTOSCOPY W/ RETROGRADES Bilateral 09/03/2017   Procedure: CYSTOSCOPY WITH RETROGRADE PYELOGRAM;  Surgeon: Hollice Espy, MD;  Location: [facility];  Service: Urology;  Laterality: Bilateral;  . CYSTOSCOPY W/ RETROGRADES Right 01/28/2018   Procedure: CYSTOSCOPY WITH RETROGRADE PYELOGRAM;  Surgeon: Hollice Espy, MD;  Location: [facility];  Service: Urology;  Laterality: Right;  . CYSTOSCOPY W/ URETERAL STENT REMOVAL  08/2017  . CYSTOSCOPY WITH BIOPSY Right  01/28/2018   Procedure: CYSTOSCOPY WITH BIOPSY;  Surgeon: Hollice Espy, MD;  Location: [facility];  Service: Urology;  Laterality: Right;  . CYSTOSCOPY WITH STENT PLACEMENT Right 09/03/2017   Procedure: CYSTOSCOPY WITH STENT PLACEMENT;  Surgeon: Hollice Espy, MD;  Location: [facility];  Service: Urology;  Laterality: Right;  . CYSTOSCOPY WITH STENT PLACEMENT Right 01/28/2018   Procedure: CYSTOSCOPY WITH STENT PLACEMENT;  Surgeon: Hollice Espy, MD;  Location: [facility];  Service: Urology;  Laterality: Right;  . CYSTOSCOPY WITH URETEROSCOPY Right 01/28/2018   Procedure: CYSTOSCOPY WITH URETEROSCOPY;  Surgeon: Hollice Espy, MD;  Location: [facility];  Service: Urology;  Laterality: Right;  . ENDARTERECTOMY Right 07/17/2017   Procedure: ENDARTERECTOMY CAROTID;  Surgeon: Algernon Huxley, MD;  Location: [facility];  Service: Vascular;  Laterality: Right;  . HERNIA REPAIR  2011   Ventral hernia  . HOLMIUM LASER APPLICATION N/A 02/72/5366   Procedure: HOLMIUM LASER APPLICATION;  Surgeon: Hollice Espy, MD;  Location: [facility];  Service: Urology;  Laterality: N/A;  . KNEE SURGERY Left    arthroscopy  . KYPHOPLASTY N/A 08/14/2017   Procedure: [id number];  Surgeon: Hessie Knows, MD;  Location: [facility];  Service: Orthopedics;  Laterality: N/A;  . LARYNX SURGERY    . POLYPECTOMY  10/26/2018   Procedure: POLYPECTOMY INTESTINAL;  Surgeon: Lucilla Lame, MD;  Location: [place];  Service: Endoscopy;;  Descending colon polyp Transverse colon polyps x 3  . PROSTATE SURGERY  2002   BPH benign pathology  . SPINE SURGERY  1989   Lumbar disc  . THYROPLASTY Right 01/05/2018   Procedure: THYROPLASTY;  Surgeon: Beverly Gust, MD;  Location: [facility];  Service: ENT;  Laterality: Right;     A IV Location/Drains/Wounds Patient Lines/Drains/Airways Status   Active Line/Drains/Airways    Name:   Placement date:   Placement time:   Site:   Days:   Peripheral IV 08/04/19 Left Forearm   08/04/19     2254    Forearm   1   Peripheral IV 08/05/19 Right Antecubital   08/05/19    0025    Antecubital   less than 1   Closed System Drain 1 Right Neck Other (Comment) 7 Fr.   01/05/18    0822    Neck   577   Ureteral Drain/Stent Right ureter 6 Fr.   01/28/18    1252    Right ureter   554   Incision (Closed) 07/17/17 Neck Right   07/17/17    1507     749   Incision (Closed) 08/14/17 Back   08/14/17    1110     721   Incision (Closed) 09/03/17 Penis Other (Comment)   09/03/17    1420     701   Incision (Closed) 01/05/18 Neck   01/05/18    0746     577   Incision (Closed) 01/28/18 Penis Other (Comment)   01/28/18    1153     554  Incision (Closed) 10/26/18 Rectum   10/26/18    1200     283          Intake/Output Last 24 hours No intake or output data in the 24 hours ending 08/05/19 0156  Labs/Imaging Results for orders placed or performed during the hospital encounter of 08/04/19 (from the past 48 hour(s))  Basic metabolic panel     Status: Abnormal   Collection Time: 08/04/19 10:50 PM  Result Value Ref Range   Sodium 136 135 - 145 mmol/L   Potassium 5.2 (H) 3.5 - 5.1 mmol/L   Chloride 107 98 - 111 mmol/L   CO2 21 (L) 22 - 32 mmol/L   Glucose, Bld 336 (H) 70 - 99 mg/dL   BUN 60 (H) 8 - 23 mg/dL   Creatinine, Ser 5.91 (H) 0.61 - 1.24 mg/dL   Calcium 8.3 (L) 8.9 - 10.3 mg/dL   GFR calc non Af Amer 8 (L) >60 mL/min   GFR calc Af Amer 10 (L) >60 mL/min   Anion gap 8 5 - 15    Comment: Performed at [facility], [address] [place], [place] [address]  CBC     Status: Abnormal   Collection Time: 08/04/19 10:50 PM  Result Value Ref Range   WBC 8.6 4.0 - 10.5 K/uL   RBC 3.66 (L) 4.22 - 5.81 MIL/uL   Hemoglobin 11.7 (L) 13.0 - 17.0 g/dL   HCT 36.0 (L) 39.0 - 52.0 %   MCV 98.4 80.0 - 100.0 fL   MCH 32.0 26.0 - 34.0 pg   MCHC 32.5 30.0 - 36.0 g/dL   RDW 15.9 (H) 11.5 - 15.5 %   Platelets 129 (L) 150 - 400 K/uL   nRBC 0.0 0.0 - 0.2 %    Comment: Performed at [facility] [place], [place] [address]  Troponin I (High Sensitivity)     Status: Abnormal   Collection Time: 08/04/19 10:50 PM  Result Value Ref Range   Troponin I (High Sensitivity) 471 (HH) <18 ng/L    Comment: CRITICAL RESULT CALLED TO, READ BACK BY AND VERIFIED WITH DEE Jazel Nimmons AT 2329 ON 08/04/19 RWW (NOTE) Elevated high sensitivity troponin I (hsTnI) values and significant  changes across serial measurements may suggest ACS but many other  chronic and acute conditions are known to elevate hsTnI results.  Refer to the "Links" section for chest pain algorithms and additional  guidance. Performed at [facility], [address] [place], [place] [address]   APTT     Status: None   Collection Time: 08/04/19 10:50 PM  Result Value Ref Range   aPTT 33 24 - 36 seconds    Comment: Performed at [facility], [address] [place], [place] [address]  Protime-INR     Status: None   Collection Time: 08/04/19 10:50 PM  Result Value Ref Range   Prothrombin Time 13.8 11.4 - 15.2 seconds   INR 1.1 0.8 - 1.2    Comment: (NOTE) INR goal varies based on device and disease states. Performed at [facility], [address] [place], [place] [address]   SARS Coronavirus 2 by RT PCR (hospital order, performed in [facility] hospital lab) Nasopharyngeal Nasopharyngeal Swab     Status: None   Collection Time: 08/05/19 12:19 AM   Specimen: Nasopharyngeal Swab  Result Value Ref Range   SARS Coronavirus 2 NEGATIVE NEGATIVE    Comment: (NOTE) If result is NEGATIVE SARS-CoV-2 target  nucleic acids are NOT DETECTED. The SARS-CoV-2 RNA is generally detectable in upper and lower  respiratory specimens during the acute phase of infection. The lowest  concentration of SARS-CoV-2 viral copies this assay can detect is 250  copies / mL. A negative result does not preclude SARS-CoV-2 infection  and should not be used as the sole basis for treatment or  other  patient management decisions.  A negative result may occur with  improper specimen collection / handling, submission of specimen other  than nasopharyngeal swab, presence of viral mutation(s) within the  areas targeted by this assay, and inadequate number of viral copies  (<250 copies / mL). A negative result must be combined with clinical  observations, patient history, and epidemiological information. If result is POSITIVE SARS-CoV-2 target nucleic acids are DETECTED. The SARS-CoV-2 RNA is generally detectable in upper and lower  respiratory specimens dur ing the acute phase of infection.  Positive  results are indicative of active infection with SARS-CoV-2.  Clinical  correlation with patient history and other diagnostic information is  necessary to determine patient infection status.  Positive results do  not rule out bacterial infection or co-infection with other viruses. If result is PRESUMPTIVE POSTIVE SARS-CoV-2 nucleic acids MAY BE PRESENT.   A presumptive positive result was obtained on the submitted specimen  and confirmed on repeat testing.  While 2019 novel coronavirus  (SARS-CoV-2) nucleic acids may be present in the submitted sample  additional confirmatory testing may be necessary for epidemiological  and / or clinical management purposes  to differentiate between  SARS-CoV-2 and other Sarbecovirus currently known to infect humans.  If clinically indicated additional testing with an alternate test  methodology [phone]) is advised. The SARS-CoV-2 RNA is generally  detectable in upper and lower respiratory sp ecimens during the acute  phase of infection. The expected result is Negative. Fact Sheet for Patients:  StrictlyIdeas.no Fact Sheet for Healthcare Providers: BankingDealers.co.za This test is not yet approved or cleared by the [place] FDA and has been authorized for detection and/or diagnosis of  SARS-CoV-2 by FDA under an Emergency Use Authorization (EUA).  This EUA will remain in effect (meaning this test can be used) for the duration of the COVID-19 declaration under Section 564(b)(1) of the Act, 21 U.S.C. section 360bbb-3(b)(1), unless the authorization is terminated or revoked sooner. Performed at [facility], [address] [place], [place] [address]   Troponin I (High Sensitivity)     Status: Abnormal   Collection Time: 08/05/19 12:43 AM  Result Value Ref Range   Troponin I (High Sensitivity) 507 (HH) <18 ng/L    Comment: CRITICAL RESULT CALLED TO, READ BACK BY AND VERIFIED WITH DEE Khia Dieterich AT 0121 ON 08/05/19 RWW (NOTE) Elevated high sensitivity troponin I (hsTnI) values and significant  changes across serial measurements may suggest ACS but many other  chronic and acute conditions are known to elevate hsTnI results.  Refer to the "Links" section for chest pain algorithms and additional  guidance. Performed at [facility], [address] [place], [place] [address]    No results found.  Pending Labs Unresulted Labs (From admission, onward)    Start     Ordered   08/05/19 0800  Heparin level (unfractionated)  Once-Timed,   STAT     08/05/19 0008   08/05/19 0500  Lipid panel  Tomorrow morning,   STAT     08/05/19 0037   08/05/19 0500  CBC  Tomorrow morning,   STAT  08/05/19 0037   08/05/19 9678  Basic metabolic panel  Tomorrow morning,   STAT     08/05/19 0037   08/05/19 0034  Hemoglobin A1c  Once,   STAT    Comments: To assess prior glycemic control    08/05/19 0037          Vitals/Pain Today's Vitals   08/04/19 2253 08/04/19 2330 08/05/19 0000 08/05/19 0030  BP: (!) 170/76 108/78 115/75 (!) 174/68  Pulse: 61 61 (!) 46 63  Resp:  (!) 21 19 20   Temp: 98.1 F (36.7 C)     TempSrc: Oral     SpO2: 96% 94% 95% 98%  Weight:      Height:      PainSc:        Isolation Precautions No active  isolations  Medications Medications  heparin ADULT infusion 100 units/mL (25000 units/272mL sodium chloride 0.45%) (1,350 Units/hr Intravenous New Bag/Given 08/05/19 0028)  acetaminophen (TYLENOL) tablet 1,000 mg (has no administration in time range)  valACYclovir (VALTREX) tablet 1,000 mg (has no administration in time range)  atenolol (TENORMIN) tablet 50 mg (has no administration in time range)  atorvastatin (LIPITOR) tablet 40 mg (has no administration in time range)  calcitRIOL (ROCALTROL) capsule 0.5 mcg (has no administration in time range)  levothyroxine (SYNTHROID) tablet 300 mcg (has no administration in time range)  sodium bicarbonate tablet 650 mg (has no administration in time range)  clopidogrel (PLAVIX) tablet 75 mg (has no administration in time range)  aspirin EC tablet 81 mg (has no administration in time range)  nitroGLYCERIN (NITROSTAT) SL tablet 0.4 mg (has no administration in time range)  ondansetron (ZOFRAN) injection 4 mg (has no administration in time range)  0.9 %  sodium chloride infusion (has no administration in time range)  insulin aspart (novoLOG) injection 0-9 Units (has no administration in time range)  insulin aspart (novoLOG) injection 0-5 Units (has no administration in time range)  heparin bolus via infusion 4,000 Units (4,000 Units Intravenous Bolus from Bag 08/05/19 0027)    Mobility walks with person assist Low fall risk   Focused Assessments    R Recommendations: See Admitting Provider Note  Report given to: Wille Glaser, RN

## 2019-08-05 NOTE — Progress Notes (Signed)
*  PRELIMINARY RESULTS* Echocardiogram 2D Echocardiogram has been performed.  [name] 08/05/2019, 11:50 AM

## 2019-08-05 NOTE — Consult Note (Addendum)
[facility] Cardiology  CARDIOLOGY CONSULT NOTE  Patient ID: COSTA JHA MRN: [id number] DOB/AGE: Nov 14, 1940 78 y.o.  Admit date: 08/04/2019 Referring Physician Dr. Darvin Neighbours Primary Physician Dr. Army Melia Primary Cardiologist Dr. Ubaldo Glassing Reason for Consultation Chest pain, elevated troponin  HPI:  [name] is a 78 y.o. with a past medical history significant for coronary artery disease s/p CABG x3 with a LIMA to LAD, SVG to OD, and SVG to RCA in 1997, carotid artery disease s/p endarterectomy x2, type 2 diabetes mellitus, HTN, HLD, CKD, and prior stroke, who presented to the ED last night for chest pain. He was eating dinner when he suddenly experienced chest discomfort described as a pressure sensation to the center of his chest without radiation elsewhere. He states that it was not a true pain but more of an ache in his chest. He had no associated nausea, vomiting, shortness of breath, or diaphoresis with this discomfort. He called 911 and received two sublingual nitroglycerine en route which resolved his pain. He has been chest pain free since his arrival to the ER.   He reports no history of similar pain previously. Prior to last night, he had been in his usual state of health without any recent illnesses, changes to his breathing, or other recent symptoms.   In the ER he was found to have an elevated initial high sensitivity troponin to 471. Repeat drawn two hours later was 507. Patient's other labs are notable for acute on chronic kidney failure with creatinine of 5.91 (up from baseline of 3.9). His EKG did not show any significant ST elevation or depression. CXR unremarkable for acute processes. He was started on a heparin drip for presumed ACS. While on telemetry he has experienced intermittent pauses and arrhythmias that were labeled atrial fibrillation, however, review of strips shows that the patient has intermittent high grade AV block without evidence of A-fib.   Review of systems complete  and found to be negative unless listed above   Past Medical History:  Diagnosis Date  . Blood transfusion without reported diagnosis    patient unaware of receiving blood unless it was during surgery and he was not told  . Cancer [facility]) 2001   Colon resection  . CAP (community acquired pneumonia) 08/11/2017  . Carotid stenosis, symptomatic, with infarction ([place]) 07/17/2017  . Chronic kidney disease 11/2017   stage IV CKD per nephrologist.  . Complication of anesthesia    raspy voice since carotid endarterectomy 07/17/17. paralysis of vocal chords  . Coronary artery disease   . Diabetes mellitus without complication ([place])   . Dysrhythmia    patient unaware of any irregular heart rhythms  . Hyperlipidemia   . Hypertension   . Hypothyroidism   . Kidney mass 2019  . Myocardial infarction ([place]) 1997  . Peripheral vascular disease ([place])   . Pneumonia 2014, 2018   developed after surgery 2018  . Stroke ([place]) 07/2017   mild stroke and then had carotid endarterectomy  . Thyroid disease   . Wears dentures    full upper  . Weight loss 2019   patient has lost over 60 pounds since 07/2017.     Past Surgical History:  Procedure Laterality Date  . APPENDECTOMY  2011  . BACK SURGERY    . CARDIAC CATHETERIZATION  1997  . CAROTID ANGIOGRAPHY Right 06/13/2017   Procedure: Right subclavian and Carotid Angiography, possible intervention;  Surgeon: Algernon Huxley, MD;  Location: [place] [facility];  Service: Cardiovascular;  Laterality:  Right;  Marland Kitchen CATARACT EXTRACTION, BILATERAL    . CHOLECYSTECTOMY N/A 04/23/2016   had infection post surgery requiring him to debride daily  . 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 COLONOSCOPY WITH PROPOFOL N/A 10/26/2018   Procedure: COLONOSCOPY WITH PROPOFOL;  Surgeon: Lucilla Lame, MD;  Location: [place];  Service: Endoscopy;  Laterality: N/A;  Diabetic - insulin  . CORONARY ARTERY BYPASS GRAFT  1997   x 3   . CYSTOSCOPY W/ RETROGRADES Bilateral 09/03/2017   Procedure: CYSTOSCOPY WITH RETROGRADE PYELOGRAM;  Surgeon: Hollice Espy, MD;  Location: [facility];  Service: Urology;  Laterality: Bilateral;  . CYSTOSCOPY W/ RETROGRADES Right 01/28/2018   Procedure: CYSTOSCOPY WITH RETROGRADE PYELOGRAM;  Surgeon: Hollice Espy, MD;  Location: [facility];  Service: Urology;  Laterality: Right;  . CYSTOSCOPY W/ URETERAL STENT REMOVAL  08/2017  . CYSTOSCOPY WITH BIOPSY Right 01/28/2018   Procedure: CYSTOSCOPY WITH BIOPSY;  Surgeon: Hollice Espy, MD;  Location: [facility];  Service: Urology;  Laterality: Right;  . CYSTOSCOPY WITH STENT PLACEMENT Right 09/03/2017   Procedure: CYSTOSCOPY WITH STENT PLACEMENT;  Surgeon: Hollice Espy, MD;  Location: [facility];  Service: Urology;  Laterality: Right;  . CYSTOSCOPY WITH STENT PLACEMENT Right 01/28/2018   Procedure: CYSTOSCOPY WITH STENT PLACEMENT;  Surgeon: Hollice Espy, MD;  Location: [facility];  Service: Urology;  Laterality: Right;  . CYSTOSCOPY WITH URETEROSCOPY Right 01/28/2018   Procedure: CYSTOSCOPY WITH URETEROSCOPY;  Surgeon: Hollice Espy, MD;  Location: [facility];  Service: Urology;  Laterality: Right;  . ENDARTERECTOMY Right 07/17/2017   Procedure: ENDARTERECTOMY CAROTID;  Surgeon: Algernon Huxley, MD;  Location: [facility];  Service: Vascular;  Laterality: Right;  . HERNIA REPAIR  2011   Ventral hernia  . HOLMIUM LASER APPLICATION N/A 46/96/2952   Procedure: HOLMIUM LASER APPLICATION;  Surgeon: Hollice Espy, MD;  Location: [facility];  Service: Urology;  Laterality: N/A;  . KNEE SURGERY Left    arthroscopy  . KYPHOPLASTY N/A 08/14/2017   Procedure: [id number];  Surgeon: Hessie Knows, MD;  Location: [facility];  Service: Orthopedics;  Laterality: N/A;  . LARYNX SURGERY    . POLYPECTOMY  10/26/2018   Procedure: POLYPECTOMY INTESTINAL;  Surgeon: Lucilla Lame, MD;  Location: [place];  Service: Endoscopy;;  Descending colon polyp Transverse  colon polyps x 3  . PROSTATE SURGERY  2002   BPH benign pathology  . SPINE SURGERY  1989   Lumbar disc  . THYROPLASTY Right 01/05/2018   Procedure: THYROPLASTY;  Surgeon: Beverly Gust, MD;  Location: [facility];  Service: ENT;  Laterality: Right;    Medications Prior to Admission  Medication Sig Dispense Refill Last Dose  . acetaminophen (TYLENOL) 500 MG tablet Take 1,000 mg every 6 (six) hours as needed by mouth for moderate pain or headache.   prn at prn  . atenolol (TENORMIN) 50 MG tablet TAKE 1 TABLET BY MOUTH ONCE DAILY (Patient taking differently: Take 50 mg by mouth daily. ) 90 tablet 3 08/04/2019 at 0800  . atorvastatin (LIPITOR) 40 MG tablet TAKE 1 TABLET BY MOUTH ONCE DAILY AT  6PM (Patient taking differently: Take 40 mg by mouth daily at 6 PM. ) 90 tablet 0 Past Week at Unknown time  . calcitRIOL (ROCALTROL) 0.5 MCG capsule Take 0.5 mcg by mouth daily.   08/04/2019 at Unknown time  . clopidogrel (PLAVIX) 75 MG tablet Take 1 tablet by mouth once daily (Patient taking differently: Take 75 mg by  mouth daily. ) 90 tablet 0 08/04/2019 at Unknown time  . EQ ASPIRIN ADULT LOW DOSE 81 MG EC tablet TAKE 1  BY MOUTH ONCE DAILY (Patient taking differently: Take 81 mg by mouth daily. ) 90 tablet 0 08/04/2019 at Unknown time  . insulin NPH Human (HUMULIN N,NOVOLIN N) 100 UNIT/ML injection Inject 0.25 mLs (25 Units total) into the skin 2 (two) times daily. (Patient taking differently: Inject 30 Units into the skin 2 (two) times daily. ) 10 mL 11 08/04/2019 at Unknown time  . insulin regular (NOVOLIN R,HUMULIN R) 100 units/mL injection Inject 12 Units into the skin 2 (two) times daily.    08/04/2019 at Unknown time  . levothyroxine (SYNTHROID, LEVOTHROID) 300 MCG tablet Take 300 mcg by mouth daily before breakfast.    08/04/2019 at Unknown time  . sodium bicarbonate 650 MG tablet Take 650 mg by mouth 2 (two) times daily.   08/04/2019 at Unknown time  . valACYclovir (VALTREX) 1000 MG tablet Take 1  tablet (1,000 mg total) by mouth 3 (three) times daily. 21 tablet 0 08/04/2019 at Unknown time  . Blood Glucose Monitoring Suppl (FIFTY50 GLUCOSE METER 2.0) w/Device KIT 1 Device by Other route 2 (two) times daily.     Marland Kitchen glucose blood (RELION PRIME TEST) test strip 1 strip by Other route 2 (two) times daily.      Social History   Socioeconomic History  . Marital status: Married    Spouse name: Not on file  . Number of children: 2  . Years of education: some college  . Highest education level: 12th grade  Occupational History  . Occupation: retired  Scientific laboratory technician  . Financial resource strain: Not hard at all  . Food insecurity    Worry: Never true    Inability: Never true  . Transportation needs    Medical: No    Non-medical: No  Tobacco Use  . Smoking status: Former Smoker    Packs/day: 1.50    Years: 44.00    Pack years: 66.00    Types: Cigarettes    Quit date: 06/24/1996    Years since quitting: 23.1  . Smokeless tobacco: Never Used  . Tobacco comment: smoking cessation materials not required  Substance and Sexual Activity  . Alcohol use: Yes    Alcohol/week: 0.0 standard drinks    Comment: rare  . Drug use: No  . Sexual activity: Not Currently  Lifestyle  . Physical activity    Days per week: 0 days    Minutes per session: 0 min  . Stress: Only a little  Relationships  . Social Herbalist on phone: Patient refused    Gets together: Patient refused    Attends religious service: Patient refused    Active member of club or organization: Patient refused    Attends meetings of clubs or organizations: Patient refused    Relationship status: Married  . Intimate partner violence    Fear of current or ex partner: No    Emotionally abused: No    Physically abused: No    Forced sexual activity: No  Other Topics Concern  . Not on file  Social History Narrative  . Not on file    Family History  Problem Relation Age of Onset  . Diabetes Mother   . Heart  disease Mother   . Heart disease Father   . Cancer Father        lung    Review of systems complete  and found to be negative unless listed above    PHYSICAL EXAM  General: Well developed, well nourished, in no acute distress HEENT:  Normocephalic and atramatic Neck:  No JVD.  Lungs: Breathing comfortable on room air. No wheezes. Crackles to right base.  Heart: HRRR . Normal S1 and S2 without gallops or murmurs.  Extremities: No clubbing, cyanosis or edema.   Neuro: Alert and oriented X 3. Psych:  Good affect, responds appropriately  Labs:   Lab Results  Component Value Date   WBC 8.1 08/05/2019   HGB 11.6 (L) 08/05/2019   HCT 35.6 (L) 08/05/2019   MCV 98.1 08/05/2019   PLT 121 (L) 08/05/2019    Recent Labs  Lab 08/05/19 0439  NA 138  K 4.5  CL 111  CO2 18*  BUN 58*  CREATININE 5.72*  CALCIUM 8.2*  GLUCOSE 210*   Lab Results  Component Value Date   CKTOTAL 222 11/08/2011   CKMB 2.7 11/08/2011   TROPONINI <0.03 08/10/2017    Lab Results  Component Value Date   CHOL 131 08/05/2019   CHOL 91 06/16/2018   CHOL 83 06/12/2017   Lab Results  Component Value Date   HDL 18 (L) 08/05/2019   HDL 17 (A) 06/16/2018   HDL 13 (L) 06/12/2017   Lab Results  Component Value Date   LDLCALC 82 08/05/2019   LDLCALC 41 06/16/2018   LDLCALC 29 06/12/2017   Lab Results  Component Value Date   TRIG 156 (H) 08/05/2019   TRIG 165 (A) 06/16/2018   TRIG 206 (H) 06/12/2017   Lab Results  Component Value Date   CHOLHDL 7.3 08/05/2019   CHOLHDL 6.4 06/12/2017   CHOLHDL 6.0 (H) 07/22/2016   No results found for: LDLDIRECT    Radiology: Dg Chest Port 1 View  Result Date: 08/04/2019 CLINICAL DATA:  Chest pain EXAM: PORTABLE CHEST 1 VIEW COMPARISON:  08/10/2017 FINDINGS: Post sternotomy changes. Chronic elevation of right diaphragm. No acute airspace disease or effusion. Stable cardiomediastinal silhouette with aortic atherosclerosis. No pneumothorax. IMPRESSION: No  active disease.  Stable chronic elevation of right diaphragm Electronically Signed   By: Donavan Foil M.D.   On: 08/04/2019 23:28    EKG: Sinus rhythm, rate of 64 BPM, intermittent dropped QRS complexes, no significant ST elevation or depression, T wave inversions in III and aVF (new as compared to prior EKG)  ASSESSMENT AND PLAN:   Mr. Rudd is a 78 y.o. with a past medical history significant for coronary artery disease s/p CABG x3 with a LIMA to LAD, SVG to OD, and SVG to RCA in 1997, carotid artery disease s/p endarterectomy x2, type 2 diabetes mellitus, CKD, HTN, HLD, and prior stroke, who presented last night following an episode of sudden onset chest pain described as a pressure to the center of his chest. Pain resolved en route to ER shortly after receiving sublingual nitroglycerine x2. High sensitivity troponin was 471 then 507 in the ER. Started on heparin drip in the ER. He is chest pain free currently. Patient has also had acute on chronic kidney dysfunction with creatinine of 5.91 (up from baseline of 3.9). He is not on dialysis.   Chest pain / Elevation troponin Given known history of coronary artery disease, chest pain that was relieved with nitroglycerine and elevated troponins, concern for possible ACS. Troponin 471 > 507 > 1266. However, we are unable to proceed to cardiac catheterization at this time due to patient's creatinine level (5.72) - No further  cardiac procedure or interventions indicated at this time due to elevated creatinine.  - Continue to trend troponins and closely monitor symptoms  - Continue with medical management using heparin drip, antiplatelet agent, and statin   Intermittent completed heart block:  Patient found to have intermittent heart block on telemetry, likely related to atenolol and worsening kidney function. There is no evidence of atrial fibrillation on any saved strips or EKG. Patient is asymptomatic currently. Will trial discontinuation of atenolol  and continue to closely monitor on telemetry.  - Discontinue atenolol  - Consider use of hydralazine for blood pressure control   The patient's history and exam findings were discussed with Dr. Nehemiah Massed. The plan was made in conjunction with Dr. Nehemiah Massed.  Signed: Hilbert Odor PA-C 08/05/2019, 7:43 AM  The patient has been interviewed and examined. I agree with assessment and plan above. Serafina Royals MD [facility]

## 2019-08-05 NOTE — Progress Notes (Signed)
ANTICOAGULATION CONSULT NOTE - Initial Consult  Pharmacy Consult for heparin Indication: chest pain/ACS  Allergies  Allergen Reactions  . Ace Inhibitors Other (See Comments)    Reaction:  Raises potassium   . Quinapril Rash and Other (See Comments)    hyperkalemia    Patient Measurements: Height: 6\' 2"  (188 cm) Weight: 215 lb (97.5 kg) IBW/kg (Calculated) : 82.2 Heparin Dosing Weight: 97.5 kg  Vital Signs: Temp: 98.1 F (36.7 C) (10/21 2253) Temp Source: Oral (10/21 2253) BP: 170/76 (10/21 2253) Pulse Rate: 61 (10/21 2253)  Labs: Recent Labs    08/04/19 2250  HGB 11.7*  HCT 36.0*  PLT 129*  APTT 33  LABPROT 13.8  INR 1.1  CREATININE 5.91*  TROPONINIHS 471*    Estimated Creatinine Clearance: 11.8 mL/min (A) (by C-G formula based on SCr of 5.91 mg/dL (H)).   Medical History: Past Medical History:  Diagnosis Date  . Blood transfusion without reported diagnosis    patient unaware of receiving blood unless it was during surgery and he was not told  . Cancer [facility]) 2001   Colon resection  . CAP (community acquired pneumonia) 08/11/2017  . Carotid stenosis, symptomatic, with infarction ([place]) 07/17/2017  . Chronic kidney disease 11/2017   stage IV CKD per nephrologist.  . Complication of anesthesia    raspy voice since carotid endarterectomy 07/17/17. paralysis of vocal chords  . Coronary artery disease   . Diabetes mellitus without complication ([place])   . Dysrhythmia    patient unaware of any irregular heart rhythms  . Hyperlipidemia   . Hypertension   . Hypothyroidism   . Kidney mass 2019  . Myocardial infarction ([place]) 1997  . Peripheral vascular disease ([place])   . Pneumonia 2014, 2018   developed after surgery 2018  . Stroke ([place]) 07/2017   mild stroke and then had carotid endarterectomy  . Thyroid disease   . Wears dentures    full upper  . Weight loss 2019   patient has lost over 60 pounds since 07/2017.     Medications:  Scheduled:     Assessment: Patient arrives to ED w/ c/o CP w/ h/o CABG in 78 and DM, CKD, HTN developed rapid onset of CP and found to have trop of 471 tonight w/ EKG showing non-specific T-wave abnormalities. No anticoagulation PTA aside from plavix for prior stroke. Patient is being started on heparin drip for NSTEMI.  Goal of Therapy:  Heparin level 0.3-0.7 units/ml Monitor platelets by anticoagulation protocol: Yes   Plan:  Will bolus heparin 4000 units IV x 1 Will start rate at 1350 units/hr Baseline labs pending. Will check anti-Xa level at 0800. Will monitor daily CBC's and adjust per anti-Xa levels.  Tobie Lords, PharmD, BCPS Clinical Pharmacist 08/05/2019,12:29 AM

## 2019-08-05 NOTE — ED Notes (Signed)
NP made aware of blood pressure, states that she does not want his BP to drop too low due to his other co-morbidities but will evaluate his medications.

## 2019-08-05 NOTE — Progress Notes (Signed)
ANTICOAGULATION CONSULT NOTE  Pharmacy Consult for: Heparin Indication: chest pain/ACS  Allergies  Allergen Reactions  . Ace Inhibitors Other (See Comments)    Reaction:  Raises potassium   . Quinapril Rash and Other (See Comments)    hyperkalemia    Patient Measurements: Height: 6\' 2"  (188 cm) Weight: 215 lb (97.5 kg) IBW/kg (Calculated) : 82.2 Heparin Dosing Weight: 97.5 kg  Vital Signs: Temp: 97.9 F (36.6 C) (10/22 1713) Temp Source: Oral (10/22 1713) BP: 163/71 (10/22 1834) Pulse Rate: 70 (10/22 1834)  Labs: Recent Labs    08/04/19 2250 08/05/19 0043 08/05/19 0439 08/05/19 0800 08/05/19 0818 08/05/19 1618 08/05/19 1624  HGB 11.7*  --  11.6*  --   --   --   --   HCT 36.0*  --  35.6*  --   --   --   --   PLT 129*  --  121*  --   --   --   --   APTT 33  --   --   --   --   --   --   LABPROT 13.8  --   --   --   --   --   --   INR 1.1  --   --   --   --   --   --   HEPARINUNFRC  --   --   --  0.52  --  0.50  --   CREATININE 5.91*  --  5.72*  --   --   --   --   TROPONINIHS 471* 507*  --   --  1,266*  --  1,123*    Estimated Creatinine Clearance: 12.2 mL/min (A) (by C-G formula based on SCr of 5.72 mg/dL (H)).   Medical History: Past Medical History:  Diagnosis Date  . Blood transfusion without reported diagnosis    patient unaware of receiving blood unless it was during surgery and he was not told  . Cancer [facility]) 2001   Colon resection  . CAP (community acquired pneumonia) 08/11/2017  . Carotid stenosis, symptomatic, with infarction ([place]) 07/17/2017  . Chronic kidney disease 11/2017   stage IV CKD per nephrologist.  . Complication of anesthesia    raspy voice since carotid endarterectomy 07/17/17. paralysis of vocal chords  . Coronary artery disease   . Diabetes mellitus without complication ([place])   . Dysrhythmia    patient unaware of any irregular heart rhythms  . Hyperlipidemia   . Hypertension   . Hypothyroidism   . Kidney mass 2019  .  Myocardial infarction ([place]) 1997  . Peripheral vascular disease ([place])   . Pneumonia 2014, 2018   developed after surgery 2018  . Stroke ([place]) 07/2017   mild stroke and then had carotid endarterectomy  . Thyroid disease   . Wears dentures    full upper  . Weight loss 2019   patient has lost over 60 pounds since 07/2017.     Medications:  Scheduled:  . [START ON 08/06/2019] aspirin EC  81 mg Oral Daily  . atorvastatin  80 mg Oral q1800  . calcitRIOL  0.5 mcg Oral Daily  . clopidogrel  75 mg Oral Daily  . hydrALAZINE  50 mg Oral Q8H  . insulin aspart  0-5 Units Subcutaneous QHS  . insulin aspart  0-9 Units Subcutaneous TID WC  . levothyroxine  300 mcg Oral QAC breakfast  . sodium bicarbonate  650 mg Oral BID  . valACYclovir  1,000  mg Oral TID    Assessment: Patient arrives to ED w/ c/o chest pain w/ h/o CABG in 97 and DM, CKD, HTN developed rapid onset of CP and found to have trop of 471 last night w/ EKG showing non-specific T-wave abnormalities. No anticoagulation PTA aside from plavix for prior stroke. Patient is being started on heparin drip for NSTEMI. Heparin level of 0.52 within goal x1.  Goal of Therapy:  Heparin level 0.3-0.7 units/ml Monitor platelets by anticoagulation protocol: Yes   Plan:  10/22 1618 HL 0.50,  Continue rate at 1350 units/hr Will check anti-Xa level again with am labs Will monitor daily CBC's and adjust per anti-Xa levels.  Chinita Greenland PharmD Clinical Pharmacist 08/05/2019

## 2019-08-05 NOTE — Progress Notes (Signed)
[employer] [facility] - [place] [facility]   PATIENT NAME: [name]    MR#:  [id number]  DATE OF BIRTH:  1941/10/14  SUBJECTIVE:  CHIEF COMPLAINT:   Chief Complaint  Patient presents with  . Chest Pain   Still having some chest pain  REVIEW OF SYSTEMS:    Review of Systems  Constitutional: Negative for chills and fever.  HENT: Negative for sore throat.   Eyes: Negative for blurred vision, double vision and pain.  Respiratory: Negative for cough, hemoptysis, shortness of breath and wheezing.   Cardiovascular: Positive for chest pain. Negative for palpitations, orthopnea and leg swelling.  Gastrointestinal: Negative for abdominal pain, constipation, diarrhea, heartburn, nausea and vomiting.  Genitourinary: Negative for dysuria and hematuria.  Musculoskeletal: Negative for back pain and joint pain.  Skin: Negative for rash.  Neurological: Negative for sensory change, speech change, focal weakness and headaches.  Endo/Heme/Allergies: Does not bruise/bleed easily.  Psychiatric/Behavioral: Negative for depression. The patient is not nervous/anxious.     DRUG ALLERGIES:   Allergies  Allergen Reactions  . Ace Inhibitors Other (See Comments)    Reaction:  Raises potassium   . Quinapril Rash and Other (See Comments)    hyperkalemia    VITALS:  Blood pressure (!) 174/64, pulse (!) 59, temperature (!) 97.5 F (36.4 C), temperature source Oral, resp. rate 18, height 6\' 2"  (1.88 m), weight 97.5 kg, SpO2 100 %.  PHYSICAL EXAMINATION:   Physical Examaaaaaaaaaaaaaaaaaaaaaaaaaaaaaaaaaaaaaaaaaaaaaaaaaaaaaaaaaazzzzzzzzzzzzzzzzzzzzzzzzzzzzzzzzzzzzzzzzzzzzzzzzzzzzzzzzzzzzzzzzzzzzzzzzzzzzzzzzzzzzzzzzzzzzzzzzzzzzzzzzzzzzzzzzzzzzzzzzzzzzzzzzzzzzzzzzzzzzzzzzzzza  GENERAL:  78 y.o.-year-old patient lying in the bed with no acute distress.  EYES: Pupils equal, round, reactive to light and accommodation. No scleral icterus. Extraocular muscles intact.  HEENT: Head atraumatic,  normocephalic. Oropharynx and nasopharynx clear.  NECK:  Supple, no jugular venous distention. No thyroid enlargement, no tenderness.  LUNGS: Normal breath sounds bilaterally, no wheezing, rales, rhonchi. No use of accessory muscles of respiration.  CARDIOVASCULAR: S1, S2 normal. No murmurs, rubs, or gallops.  ABDOMEN: Soft, nontender, nondistended. Bowel sounds present. No organomegaly or mass.  EXTREMITIES: No cyanosis, clubbing or edema b/l.    NEUROLOGIC: Cranial nerves II through XII are intact. No focal Motor or sensory deficits b/l.   PSYCHIATRIC: The patient is alert and oriented x 3.  SKIN: No obvious rash, lesion, or ulcer.   LABORATORY PANEL:   CBC Recent Labs  Lab 08/05/19 0439  WBC 8.1  HGB 11.6*  HCT 35.6*  PLT 121*   ------------------------------------------------------------------------------------------------------------------ Chemistries  Recent Labs  Lab 08/05/19 0439  NA 138  K 4.5  CL 111  CO2 18*  GLUCOSE 210*  BUN 58*  CREATININE 5.72*  CALCIUM 8.2*   ------------------------------------------------------------------------------------------------------------------  Cardiac Enzymes No results for input(s): TROPONINI in the last 168 hours. ------------------------------------------------------------------------------------------------------------------  RADIOLOGY:  Dg Chest Port 1 View  Result Date: 08/04/2019 CLINICAL DATA:  Chest pain EXAM: PORTABLE CHEST 1 VIEW COMPARISON:  08/10/2017 FINDINGS: Post sternotomy changes. Chronic elevation of right diaphragm. No acute airspace disease or effusion. Stable cardiomediastinal silhouette with aortic atherosclerosis. No pneumothorax. IMPRESSION: No active disease.  Stable chronic elevation of right diaphragm Electronically Signed   By: Jasmine Pang M.D.   On: 08/04/2019 23:28     ASSESSMENT AND PLAN:   78 y.o. male with pertinent past medical history of CAD s/p CABG (1997), stage IV CKD, bilateral  carotid stenosis, PVD, MI, CVA, hypertension, hyperlipidemia, hypothyroidism, and kidney mass presenting to the ED with chief complaints of chest pain.  1. NSTEMI  ASA, Heparin, Statin Echo pending  Intermittent heart block, Atbk  Asymptomatic. Appreciate cardiolgy input                     * AKI superimposed on CKD - Hold nephrotoxins - IV fluids hydration - Continue to monitor renal function   5. HLD  + Goal LDL<100 - Atorvastatin 80mg  PO qhs - Check lipid panel  6. HTN  + Goal BP <130/80 - Continue atenolol  7. Diabetes mellitus - Check Hemoglobin A1c - SSI  8. Hypothyroidism - Continue Synthroid - Check TSH as above  All the records are reviewed and case discussed with Care Management/Social Worker Management plans discussed with the patient, family and they are in agreement.  CODE STATUS: DNR  DVT Prophylaxis: Heparin drip  TOTAL TIME TAKING CARE OF THIS PATIENT: 35 minutes.   POSSIBLE D/C IN 2-3 DAYS, DEPENDING ON CLINICAL CONDITION.  [name] M.D on 08/05/2019 at 4:56 PM  Between 7am to 6pm - Pager - [phone]  After 6pm go to [email or web address] - password [email or web address] [email or web address]  [facility] [place] [facility]  Office  [phone]  CC: Primary care physician; Reubin Milan, MD  Note: This dictation was prepared with Dragon dictation along with smaller phrase technology. Any transcriptional errors that result from this process are unintentional.

## 2019-08-05 NOTE — H&P (Signed)
[place] [facility] [place] NAME: [name]    MR#:  [id number]  DATE OF BIRTH:  Feb 16, 1941  DATE OF ADMISSION:  08/04/2019  PRIMARY CARE PHYSICIAN: Glean Hess, MD   REQUESTING/REFERRING PHYSICIAN: Rudene Re, MD  CHIEF COMPLAINT:   Chief Complaint  Patient presents with   Chest Pain    HISTORY OF PRESENT ILLNESS:  78 y.o. male with pertinent past medical history of CAD s/p CABG (1997), stage IV CKD, bilateral carotid stenosis, PVD, MI, CVA, hypertension, hyperlipidemia, hypothyroidism, and kidney mass presenting to the ED with chief complaints of chest pain.  Patient report onset of chest pain since suppertime last evening.  Described pain as dull and heavy " as if somebody sitting on my chest" without associated diaphoresis, nausea vomiting, dizziness, headache, abdominal pain, diarrhea or radiation to any extremity neck or back.  Patient immediately dialed 911 and was advised to take aspirin prior to arrival of EMS.  Patient received nitroglycerin by EMS on route to the ED.  On arrival to the ED, he was afebrile with blood pressure 170/76 mm Hg and pulse rate 61 beats/min. There were no focal neurological deficits; he was alert and oriented x4, and he did not demonstrate any memory deficits.  Patient reported he is chest pain free following admission of nitroglycerin.  Labs revealed potassium 5.2, glucose 336, BUN 60, creatinine 5.91, hemoglobin 11.7, platelets 129, initial troponin 471, repeat 507. ECG shows Normal sinus rhythm, rate of 64, normal intervals, normal axis, minimal ST depressions on lateral leads with no ST elevation. He is however noted to be going in and out atrial fibrillation. Hospitalist ask to admit for further management.  PAST MEDICAL HISTORY:   Past Medical History:  Diagnosis Date   Blood transfusion without reported diagnosis    patient unaware of receiving blood unless it was during surgery and he  was not told   Cancer [facility]) 2001   Colon resection   CAP (community acquired pneumonia) 08/11/2017   Carotid stenosis, symptomatic, with infarction ([place]) 07/17/2017   Chronic kidney disease 11/2017   stage IV CKD per nephrologist.   Complication of anesthesia    raspy voice since carotid endarterectomy 07/17/17. paralysis of vocal chords   Coronary artery disease    Diabetes mellitus without complication [facility])    Dysrhythmia    patient unaware of any irregular heart rhythms   Hyperlipidemia    Hypertension    Hypothyroidism    Kidney mass 2019   Myocardial infarction [facility]) 1997   Peripheral vascular disease ([place])    Pneumonia 2014, 2018   developed after surgery 2018   Stroke ([place]) 07/2017   mild stroke and then had carotid endarterectomy   Thyroid disease    Wears dentures    full upper   Weight loss 2019   patient has lost over 60 pounds since 07/2017.     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   COLONOSCOPY WITH PROPOFOL N/A 10/26/2018   Procedure: COLONOSCOPY WITH PROPOFOL;  Surgeon: Lucilla Lame, MD;  Location: [place];  Service: Endoscopy;  Laterality: N/A;  Diabetic - insulin   CORONARY ARTERY BYPASS GRAFT  1997   x 3   CYSTOSCOPY W/ RETROGRADES Bilateral 09/03/2017   Procedure: CYSTOSCOPY WITH RETROGRADE PYELOGRAM;  Surgeon: Hollice Espy, MD;  Location: [facility];  Service: Urology;   Laterality: Bilateral;   CYSTOSCOPY W/ RETROGRADES Right 01/28/2018   Procedure: CYSTOSCOPY WITH RETROGRADE PYELOGRAM;  Surgeon: Hollice Espy, MD;  Location: [facility];  Service: Urology;  Laterality: Right;   CYSTOSCOPY W/ URETERAL STENT REMOVAL  08/2017   CYSTOSCOPY WITH BIOPSY Right 01/28/2018   Procedure: CYSTOSCOPY WITH BIOPSY;  Surgeon: Hollice Espy, MD;  Location: [facility];  Service: Urology;  Laterality: Right;   CYSTOSCOPY WITH STENT PLACEMENT Right 09/03/2017   Procedure: CYSTOSCOPY WITH STENT PLACEMENT;  Surgeon: Hollice Espy, MD;  Location: [facility];  Service: Urology;  Laterality: Right;   CYSTOSCOPY WITH STENT PLACEMENT Right 01/28/2018   Procedure: CYSTOSCOPY WITH STENT PLACEMENT;  Surgeon: Hollice Espy, MD;  Location: [facility];  Service: Urology;  Laterality: Right;   CYSTOSCOPY WITH URETEROSCOPY Right 01/28/2018   Procedure: CYSTOSCOPY WITH URETEROSCOPY;  Surgeon: Hollice Espy, MD;  Location: [facility];  Service: Urology;  Laterality: Right;   ENDARTERECTOMY Right 07/17/2017   Procedure: ENDARTERECTOMY CAROTID;  Surgeon: Algernon Huxley, MD;  Location: [facility];  Service: Vascular;  Laterality: Right;   HERNIA REPAIR  2011   Ventral hernia   HOLMIUM LASER APPLICATION N/A 60/60/0459   Procedure: HOLMIUM LASER APPLICATION;  Surgeon: Hollice Espy, MD;  Location: [facility];  Service: Urology;  Laterality: N/A;   KNEE SURGERY Left    arthroscopy   KYPHOPLASTY N/A 08/14/2017   Procedure: [id number];  Surgeon: Hessie Knows, MD;  Location: [facility];  Service: Orthopedics;  Laterality: N/A;   LARYNX SURGERY     POLYPECTOMY  10/26/2018   Procedure: POLYPECTOMY INTESTINAL;  Surgeon: Lucilla Lame, MD;  Location: [place];  Service: Endoscopy;;  Descending colon polyp Transverse colon polyps x 3   PROSTATE SURGERY  2002   BPH benign pathology   SPINE SURGERY  1989   Lumbar disc   THYROPLASTY Right 01/05/2018   Procedure: THYROPLASTY;  Surgeon:  Beverly Gust, MD;  Location: [facility];  Service: ENT;  Laterality: Right;    SOCIAL HISTORY:   Social History   Tobacco Use   Smoking status: Former Smoker    Packs/day: 1.50    Years: 44.00    Pack years: 66.00    Types: Cigarettes    Quit date: 06/24/1996    Years since quitting: 23.1   Smokeless tobacco: Never Used   Tobacco comment: smoking cessation materials not required  Substance Use Topics   Alcohol use: Yes    Alcohol/week: 0.0 standard drinks    Comment: rare    FAMILY HISTORY:   Family History  Problem Relation Age of Onset   Diabetes Mother    Heart disease Mother    Heart disease Father    Cancer Father        lung    DRUG ALLERGIES:   Allergies  Allergen Reactions   Ace Inhibitors Other (See Comments)    Reaction:  Raises potassium    Quinapril Rash and Other (See Comments)    hyperkalemia    REVIEW OF SYSTEMS:   Review of Systems  Constitutional: Negative for  chills, fever, malaise/fatigue and weight loss.  HENT: Negative for congestion, hearing loss and sore throat.   Eyes: Negative for blurred vision and double vision.  Respiratory: Negative for cough, shortness of breath and wheezing.   Cardiovascular: Positive for chest pain. Negative for palpitations, orthopnea and leg swelling.  Gastrointestinal: Negative for abdominal pain, diarrhea, nausea and vomiting.  Genitourinary: Negative for dysuria and urgency.  Musculoskeletal: Negative for myalgias.  Skin: Negative for rash.  Neurological: Negative for dizziness, sensory change, speech change, focal weakness and headaches.  Psychiatric/Behavioral: Negative for depression.    MEDICATIONS AT HOME:   Prior to Admission medications   Medication Sig Start Date End Date Taking? Authorizing Provider  acetaminophen (TYLENOL) 500 MG tablet Take 1,000 mg every 6 (six) hours as needed by mouth for moderate pain or headache.   Yes [provider]  atenolol (TENORMIN) 50 MG  tablet TAKE 1 TABLET BY MOUTH ONCE DAILY Patient taking differently: Take 50 mg by mouth daily.  10/30/18  Yes Glean Hess, MD  atorvastatin (LIPITOR) 40 MG tablet TAKE 1 TABLET BY MOUTH ONCE DAILY AT  6PM Patient taking differently: Take 40 mg by mouth daily at 6 PM.  12/03/18  Yes Glean Hess, MD  calcitRIOL (ROCALTROL) 0.5 MCG capsule Take 0.5 mcg by mouth daily. 07/05/19  Yes [provider]  clopidogrel (PLAVIX) 75 MG tablet Take 1 tablet by mouth once daily Patient taking differently: Take 75 mg by mouth daily.  05/19/19  Yes Glean Hess, MD  EQ ASPIRIN ADULT LOW DOSE 81 MG EC tablet TAKE 1  BY MOUTH ONCE DAILY Patient taking differently: Take 81 mg by mouth daily.  11/04/18  Yes Dew, Erskine Squibb, MD  insulin NPH Human (HUMULIN N,NOVOLIN N) 100 UNIT/ML injection Inject 0.25 mLs (25 Units total) into the skin 2 (two) times daily. Patient taking differently: Inject 30 Units into the skin 2 (two) times daily.  08/15/17  Yes Vaughan Basta, MD  insulin regular (NOVOLIN R,HUMULIN R) 100 units/mL injection Inject 12 Units into the skin 2 (two) times daily.    Yes [provider]  levothyroxine (SYNTHROID, LEVOTHROID) 300 MCG tablet Take 300 mcg by mouth daily before breakfast.    Yes [provider]  sodium bicarbonate 650 MG tablet Take 650 mg by mouth 2 (two) times daily. 06/19/18  Yes [provider]  valACYclovir (VALTREX) 1000 MG tablet Take 1 tablet (1,000 mg total) by mouth 3 (three) times daily. 10/16/18  Yes Glean Hess, MD  Blood Glucose Monitoring Suppl (FIFTY50 GLUCOSE METER 2.0) w/Device KIT 1 Device by Other route 2 (two) times daily. 12/08/17   [provider]  glucose blood (RELION PRIME TEST) test strip 1 strip by Other route 2 (two) times daily. 07/09/17   [provider]      VITAL SIGNS:  Blood pressure (!) 174/68, pulse 63, temperature 98.1 F (36.7 C), temperature source Oral, resp. rate 20, height 6' 2"   (1.88 m), weight 97.5 kg, SpO2 98 %.  PHYSICAL EXAMINATION:   Physical Exam  GENERAL:  78 y.o.-year-old patient lying in the bed with no acute distress.  EYES: Pupils equal, round, reactive to light and accommodation. No scleral icterus. Extraocular muscles intact.  HEENT: Head atraumatic, normocephalic. Oropharynx and nasopharynx clear.  NECK:  Supple, no jugular venous distention. No thyroid enlargement, no tenderness.  LUNGS: Normal breath sounds bilaterally, no wheezing, rales,rhonchi or crepitation. No use of accessory muscles of respiration.  CARDIOVASCULAR: S1, S2 normal.  No murmurs, rubs, or gallops.  ABDOMEN: Soft, nontender, nondistended. Bowel sounds present. No organomegaly or mass.  EXTREMITIES: No pedal edema, cyanosis, or clubbing.  NEUROLOGIC: Cranial nerves II through XII are intact. Muscle strength 5/5 in all extremities. Sensation intact. Gait not checked.  PSYCHIATRIC: The patient is alert and oriented x 3.  SKIN: No obvious rash, lesion, or ulcer.   DATA REVIEWED:  LABORATORY PANEL:   CBC Recent Labs  Lab 08/04/19 2250  WBC 8.6  HGB 11.7*  HCT 36.0*  PLT 129*   ------------------------------------------------------------------------------------------------------------------  Chemistries  Recent Labs  Lab 08/04/19 2250  NA 136  K 5.2*  CL 107  CO2 21*  GLUCOSE 336*  BUN 60*  CREATININE 5.91*  CALCIUM 8.3*   ------------------------------------------------------------------------------------------------------------------  Cardiac Enzymes No results for input(s): TROPONINI in the last 168 hours. ------------------------------------------------------------------------------------------------------------------  RADIOLOGY:  No results found.  EKG:  EKG: unchanged from previous tracings, atrial fibrillation, rate 48.  IMPRESSION AND PLAN:   78 y.o. male with pertinent past medical history of CAD s/p CABG (1997), stage IV CKD, bilateral carotid  stenosis, PVD, MI, CVA, hypertension, hyperlipidemia, hypothyroidism, and kidney mass presenting to the ED with chief complaints of chest pain.  1. NSTEMI -  No dynamic EKG changes but with elevated troponin - Admit to telemetry unit -Trend troponin - ASA 52m PO daily (initial 162-3265m= 2-4x 8111mhewable tablets) - NTG + morphine PRN chest pain or NTG drip if ongoing chest pain  - Anti-coagulation (Heparin drip per ACS protocol) - Cardiology Consult to Dr. ParSaralyn Pilaratient follows with Dr. FatUbaldo Glassing the clinic  2. New onset Afib - noted on EKG+Telemetry - Trend Troponins - TSH, FT4 - CXR pending - TTEcho - Cardiology consult as above  3. AKI superimposed on CKD - Hold nephrotoxins - IV fluids hydration - Continue to monitor renal function  4. Coronary Artery Disease - s/p MI + CABG 1997 - ASA 70m71m daily - Clopidogrel 75mg85mdaily  - HTN, HLD, DM control as below  5. HLD  + Goal LDL<100 - Atorvastatin 80mg 71mhs - Check lipid panel  6. HTN  + Goal BP <130/80 - Continue atenolol  7. Diabetes mellitus - Check Hemoglobin A1c - SSI  8. Hypothyroidism - Continue Synthroid - Check TSH as above   All the records are reviewed and case discussed with ED provider. Management plans discussed with the patient, family and they are in agreement.  CODE STATUS: DNR  TOTAL TIME TAKING CARE OF THIS PATIENT: 50 Min38es.    on 08/05/2019 at 3:06 AM   ElizabRufina Falco FNP-BC [facility] Nurse Practitioner Between 7am to 6pm - Pager - [phone] 6pm go to [email or web address] - password [email or web address]  [facility]  Office  [phone] care physician; BergluGlean Hess

## 2019-08-05 NOTE — Progress Notes (Signed)
ANTICOAGULATION CONSULT NOTE - Initial Consult  Pharmacy Consult for: Heparin Indication: chest pain/ACS  Allergies  Allergen Reactions  . Ace Inhibitors Other (See Comments)    Reaction:  Raises potassium   . Quinapril Rash and Other (See Comments)    hyperkalemia    Patient Measurements: Height: 6\' 2"  (188 cm) Weight: 215 lb (97.5 kg) IBW/kg (Calculated) : 82.2 Heparin Dosing Weight: 97.5 kg  Vital Signs: Temp: 97.5 F (36.4 C) (10/22 0827) Temp Source: Oral (10/22 0827) BP: 174/64 (10/22 0827) Pulse Rate: 59 (10/22 0827)  Labs: Recent Labs    08/04/19 2250 08/05/19 0043 08/05/19 0439 08/05/19 0800  HGB 11.7*  --  11.6*  --   HCT 36.0*  --  35.6*  --   PLT 129*  --  121*  --   APTT 33  --   --   --   LABPROT 13.8  --   --   --   INR 1.1  --   --   --   HEPARINUNFRC  --   --   --  0.52  CREATININE 5.91*  --  5.72*  --   TROPONINIHS 471* 507*  --   --     Estimated Creatinine Clearance: 12.2 mL/min (A) (by C-G formula based on SCr of 5.72 mg/dL (H)).   Medical History: Past Medical History:  Diagnosis Date  . Blood transfusion without reported diagnosis    patient unaware of receiving blood unless it was during surgery and he was not told  . Cancer [facility]) 2001   Colon resection  . CAP (community acquired pneumonia) 08/11/2017  . Carotid stenosis, symptomatic, with infarction ([place]) 07/17/2017  . Chronic kidney disease 11/2017   stage IV CKD per nephrologist.  . Complication of anesthesia    raspy voice since carotid endarterectomy 07/17/17. paralysis of vocal chords  . Coronary artery disease   . Diabetes mellitus without complication ([place])   . Dysrhythmia    patient unaware of any irregular heart rhythms  . Hyperlipidemia   . Hypertension   . Hypothyroidism   . Kidney mass 2019  . Myocardial infarction ([place]) 1997  . Peripheral vascular disease ([place])   . Pneumonia 2014, 2018   developed after surgery 2018  . Stroke ([place]) 07/2017   mild stroke and  then had carotid endarterectomy  . Thyroid disease   . Wears dentures    full upper  . Weight loss 2019   patient has lost over 60 pounds since 07/2017.     Medications:  Scheduled:  . [START ON 08/06/2019] aspirin EC  81 mg Oral Daily  . atenolol  50 mg Oral Daily  . atorvastatin  80 mg Oral q1800  . calcitRIOL  0.5 mcg Oral Daily  . clopidogrel  75 mg Oral Daily  . insulin aspart  0-5 Units Subcutaneous QHS  . insulin aspart  0-9 Units Subcutaneous TID WC  . levothyroxine  300 mcg Oral QAC breakfast  . sodium bicarbonate  650 mg Oral BID  . valACYclovir  1,000 mg Oral TID    Assessment: Patient arrives to ED w/ c/o chest pain w/ h/o CABG in 97 and DM, CKD, HTN developed rapid onset of CP and found to have trop of 471 last night w/ EKG showing non-specific T-wave abnormalities. No anticoagulation PTA aside from plavix for prior stroke. Patient is being started on heparin drip for NSTEMI. Heparin level of 0.52 within goal x1.  Goal of Therapy:  Heparin level 0.3-0.7 units/ml  Monitor platelets by anticoagulation protocol: Yes   Plan:  Continue rate at 1350 units/hr Will check anti-Xa level again at 1600. Will monitor daily CBC's and adjust per anti-Xa levels.  Raiford Simmonds, PharmD Candidate 08/05/2019,8:37 AM

## 2019-08-06 ENCOUNTER — Encounter
Admission: EM | Disposition: A | Discharge status: Other Acute Inpatient Hospital | Payer: Self-pay | Source: Home / Self Care | Attending: Internal Medicine

## 2019-08-06 ENCOUNTER — Encounter: Payer: Self-pay | Admitting: *Deleted

## 2019-08-06 ENCOUNTER — Inpatient Hospital Stay (HOSPITAL_COMMUNITY)
Admission: AD | Admit: 2019-08-06 | Discharge: 2019-08-27 | DRG: 264 | Disposition: A | Discharge status: Hospice | Payer: Medicare HMO | Source: Other Acute Inpatient Hospital | Attending: Cardiology | Admitting: Cardiology

## 2019-08-06 ENCOUNTER — Inpatient Hospital Stay: Payer: Medicare HMO

## 2019-08-06 DIAGNOSIS — Z8249 Family history of ischemic heart disease and other diseases of the circulatory system: Secondary | ICD-10-CM

## 2019-08-06 DIAGNOSIS — E1169 Type 2 diabetes mellitus with other specified complication: Secondary | ICD-10-CM | POA: Diagnosis not present

## 2019-08-06 DIAGNOSIS — T508X5A Adverse effect of diagnostic agents, initial encounter: Secondary | ICD-10-CM | POA: Diagnosis not present

## 2019-08-06 DIAGNOSIS — I214 Non-ST elevation (NSTEMI) myocardial infarction: Secondary | ICD-10-CM

## 2019-08-06 DIAGNOSIS — J3800 Paralysis of vocal cords and larynx, unspecified: Secondary | ICD-10-CM | POA: Diagnosis present

## 2019-08-06 DIAGNOSIS — Z9049 Acquired absence of other specified parts of digestive tract: Secondary | ICD-10-CM

## 2019-08-06 DIAGNOSIS — E871 Hypo-osmolality and hyponatremia: Secondary | ICD-10-CM | POA: Diagnosis not present

## 2019-08-06 DIAGNOSIS — E1165 Type 2 diabetes mellitus with hyperglycemia: Secondary | ICD-10-CM | POA: Diagnosis not present

## 2019-08-06 DIAGNOSIS — I132 Hypertensive heart and chronic kidney disease with heart failure and with stage 5 chronic kidney disease, or end stage renal disease: Secondary | ICD-10-CM | POA: Diagnosis present

## 2019-08-06 DIAGNOSIS — I5031 Acute diastolic (congestive) heart failure: Secondary | ICD-10-CM | POA: Diagnosis present

## 2019-08-06 DIAGNOSIS — Z789 Other specified health status: Secondary | ICD-10-CM

## 2019-08-06 DIAGNOSIS — I25810 Atherosclerosis of coronary artery bypass graft(s) without angina pectoris: Secondary | ICD-10-CM

## 2019-08-06 DIAGNOSIS — T82218A Other mechanical complication of coronary artery bypass graft, initial encounter: Secondary | ICD-10-CM | POA: Diagnosis not present

## 2019-08-06 DIAGNOSIS — Z5329 Procedure and treatment not carried out because of patient's decision for other reasons: Secondary | ICD-10-CM | POA: Diagnosis not present

## 2019-08-06 DIAGNOSIS — I4891 Unspecified atrial fibrillation: Secondary | ICD-10-CM | POA: Diagnosis not present

## 2019-08-06 DIAGNOSIS — I484 Atypical atrial flutter: Secondary | ICD-10-CM | POA: Diagnosis not present

## 2019-08-06 DIAGNOSIS — R5381 Other malaise: Secondary | ICD-10-CM | POA: Diagnosis not present

## 2019-08-06 DIAGNOSIS — R05 Cough: Secondary | ICD-10-CM

## 2019-08-06 DIAGNOSIS — N141 Nephropathy induced by other drugs, medicaments and biological substances: Secondary | ICD-10-CM | POA: Diagnosis not present

## 2019-08-06 DIAGNOSIS — Z7982 Long term (current) use of aspirin: Secondary | ICD-10-CM

## 2019-08-06 DIAGNOSIS — N400 Enlarged prostate without lower urinary tract symptoms: Secondary | ICD-10-CM | POA: Diagnosis present

## 2019-08-06 DIAGNOSIS — I252 Old myocardial infarction: Secondary | ICD-10-CM | POA: Diagnosis not present

## 2019-08-06 DIAGNOSIS — R3129 Other microscopic hematuria: Secondary | ICD-10-CM | POA: Diagnosis not present

## 2019-08-06 DIAGNOSIS — Z66 Do not resuscitate: Secondary | ICD-10-CM | POA: Diagnosis not present

## 2019-08-06 DIAGNOSIS — I129 Hypertensive chronic kidney disease with stage 1 through stage 4 chronic kidney disease, or unspecified chronic kidney disease: Secondary | ICD-10-CM | POA: Diagnosis not present

## 2019-08-06 DIAGNOSIS — N179 Acute kidney failure, unspecified: Secondary | ICD-10-CM | POA: Diagnosis not present

## 2019-08-06 DIAGNOSIS — I10 Essential (primary) hypertension: Secondary | ICD-10-CM

## 2019-08-06 DIAGNOSIS — M542 Cervicalgia: Secondary | ICD-10-CM

## 2019-08-06 DIAGNOSIS — Z7189 Other specified counseling: Secondary | ICD-10-CM

## 2019-08-06 DIAGNOSIS — E872 Acidosis: Secondary | ICD-10-CM | POA: Diagnosis not present

## 2019-08-06 DIAGNOSIS — K5900 Constipation, unspecified: Secondary | ICD-10-CM | POA: Diagnosis not present

## 2019-08-06 DIAGNOSIS — E1122 Type 2 diabetes mellitus with diabetic chronic kidney disease: Secondary | ICD-10-CM | POA: Diagnosis not present

## 2019-08-06 DIAGNOSIS — R0989 Other specified symptoms and signs involving the circulatory and respiratory systems: Secondary | ICD-10-CM | POA: Diagnosis not present

## 2019-08-06 DIAGNOSIS — R040 Epistaxis: Secondary | ICD-10-CM | POA: Diagnosis not present

## 2019-08-06 DIAGNOSIS — J9601 Acute respiratory failure with hypoxia: Secondary | ICD-10-CM | POA: Diagnosis not present

## 2019-08-06 DIAGNOSIS — R410 Disorientation, unspecified: Secondary | ICD-10-CM | POA: Diagnosis not present

## 2019-08-06 DIAGNOSIS — N186 End stage renal disease: Secondary | ICD-10-CM | POA: Diagnosis not present

## 2019-08-06 DIAGNOSIS — J810 Acute pulmonary edema: Secondary | ICD-10-CM | POA: Diagnosis not present

## 2019-08-06 DIAGNOSIS — K219 Gastro-esophageal reflux disease without esophagitis: Secondary | ICD-10-CM | POA: Diagnosis not present

## 2019-08-06 DIAGNOSIS — Z515 Encounter for palliative care: Secondary | ICD-10-CM | POA: Diagnosis not present

## 2019-08-06 DIAGNOSIS — Z992 Dependence on renal dialysis: Secondary | ICD-10-CM

## 2019-08-06 DIAGNOSIS — R0902 Hypoxemia: Secondary | ICD-10-CM | POA: Diagnosis not present

## 2019-08-06 DIAGNOSIS — Z8673 Personal history of transient ischemic attack (TIA), and cerebral infarction without residual deficits: Secondary | ICD-10-CM

## 2019-08-06 DIAGNOSIS — R079 Chest pain, unspecified: Secondary | ICD-10-CM | POA: Diagnosis not present

## 2019-08-06 DIAGNOSIS — M2550 Pain in unspecified joint: Secondary | ICD-10-CM | POA: Diagnosis not present

## 2019-08-06 DIAGNOSIS — Z4901 Encounter for fitting and adjustment of extracorporeal dialysis catheter: Secondary | ICD-10-CM | POA: Diagnosis not present

## 2019-08-06 DIAGNOSIS — Z452 Encounter for adjustment and management of vascular access device: Secondary | ICD-10-CM | POA: Diagnosis not present

## 2019-08-06 DIAGNOSIS — R059 Cough, unspecified: Secondary | ICD-10-CM

## 2019-08-06 DIAGNOSIS — I4819 Other persistent atrial fibrillation: Secondary | ICD-10-CM | POA: Diagnosis not present

## 2019-08-06 DIAGNOSIS — N185 Chronic kidney disease, stage 5: Secondary | ICD-10-CM | POA: Diagnosis not present

## 2019-08-06 DIAGNOSIS — R001 Bradycardia, unspecified: Secondary | ICD-10-CM | POA: Diagnosis not present

## 2019-08-06 DIAGNOSIS — I951 Orthostatic hypotension: Secondary | ICD-10-CM | POA: Diagnosis not present

## 2019-08-06 DIAGNOSIS — R34 Anuria and oliguria: Secondary | ICD-10-CM | POA: Diagnosis not present

## 2019-08-06 DIAGNOSIS — I4892 Unspecified atrial flutter: Secondary | ICD-10-CM | POA: Diagnosis not present

## 2019-08-06 DIAGNOSIS — E876 Hypokalemia: Secondary | ICD-10-CM | POA: Diagnosis not present

## 2019-08-06 DIAGNOSIS — D631 Anemia in chronic kidney disease: Secondary | ICD-10-CM | POA: Diagnosis present

## 2019-08-06 DIAGNOSIS — IMO0002 Reserved for concepts with insufficient information to code with codable children: Secondary | ICD-10-CM

## 2019-08-06 DIAGNOSIS — Z87891 Personal history of nicotine dependence: Secondary | ICD-10-CM

## 2019-08-06 DIAGNOSIS — Z79899 Other long term (current) drug therapy: Secondary | ICD-10-CM

## 2019-08-06 DIAGNOSIS — I25119 Atherosclerotic heart disease of native coronary artery with unspecified angina pectoris: Secondary | ICD-10-CM | POA: Diagnosis present

## 2019-08-06 DIAGNOSIS — Z794 Long term (current) use of insulin: Secondary | ICD-10-CM

## 2019-08-06 DIAGNOSIS — J9811 Atelectasis: Secondary | ICD-10-CM | POA: Diagnosis not present

## 2019-08-06 DIAGNOSIS — R0602 Shortness of breath: Secondary | ICD-10-CM | POA: Diagnosis not present

## 2019-08-06 DIAGNOSIS — I351 Nonrheumatic aortic (valve) insufficiency: Secondary | ICD-10-CM | POA: Diagnosis not present

## 2019-08-06 DIAGNOSIS — Z955 Presence of coronary angioplasty implant and graft: Secondary | ICD-10-CM

## 2019-08-06 DIAGNOSIS — E1151 Type 2 diabetes mellitus with diabetic peripheral angiopathy without gangrene: Secondary | ICD-10-CM | POA: Diagnosis present

## 2019-08-06 DIAGNOSIS — N184 Chronic kidney disease, stage 4 (severe): Secondary | ICD-10-CM | POA: Diagnosis not present

## 2019-08-06 DIAGNOSIS — I441 Atrioventricular block, second degree: Secondary | ICD-10-CM | POA: Diagnosis present

## 2019-08-06 DIAGNOSIS — I2510 Atherosclerotic heart disease of native coronary artery without angina pectoris: Secondary | ICD-10-CM | POA: Diagnosis not present

## 2019-08-06 DIAGNOSIS — Z7902 Long term (current) use of antithrombotics/antiplatelets: Secondary | ICD-10-CM

## 2019-08-06 DIAGNOSIS — E875 Hyperkalemia: Secondary | ICD-10-CM | POA: Diagnosis not present

## 2019-08-06 DIAGNOSIS — E785 Hyperlipidemia, unspecified: Secondary | ICD-10-CM | POA: Diagnosis not present

## 2019-08-06 DIAGNOSIS — N323 Diverticulum of bladder: Secondary | ICD-10-CM | POA: Diagnosis present

## 2019-08-06 DIAGNOSIS — Z8719 Personal history of other diseases of the digestive system: Secondary | ICD-10-CM

## 2019-08-06 DIAGNOSIS — I361 Nonrheumatic tricuspid (valve) insufficiency: Secondary | ICD-10-CM | POA: Diagnosis not present

## 2019-08-06 DIAGNOSIS — Z79890 Hormone replacement therapy (postmenopausal): Secondary | ICD-10-CM

## 2019-08-06 DIAGNOSIS — Z888 Allergy status to other drugs, medicaments and biological substances status: Secondary | ICD-10-CM

## 2019-08-06 DIAGNOSIS — R339 Retention of urine, unspecified: Secondary | ICD-10-CM | POA: Diagnosis not present

## 2019-08-06 DIAGNOSIS — I959 Hypotension, unspecified: Secondary | ICD-10-CM | POA: Diagnosis not present

## 2019-08-06 DIAGNOSIS — Z7401 Bed confinement status: Secondary | ICD-10-CM | POA: Diagnosis not present

## 2019-08-06 DIAGNOSIS — Z833 Family history of diabetes mellitus: Secondary | ICD-10-CM

## 2019-08-06 DIAGNOSIS — N2581 Secondary hyperparathyroidism of renal origin: Secondary | ICD-10-CM | POA: Diagnosis present

## 2019-08-06 DIAGNOSIS — Z419 Encounter for procedure for purposes other than remedying health state, unspecified: Secondary | ICD-10-CM

## 2019-08-06 DIAGNOSIS — I120 Hypertensive chronic kidney disease with stage 5 chronic kidney disease or end stage renal disease: Secondary | ICD-10-CM | POA: Diagnosis not present

## 2019-08-06 DIAGNOSIS — L8990 Pressure ulcer of unspecified site, unspecified stage: Secondary | ICD-10-CM | POA: Insufficient documentation

## 2019-08-06 DIAGNOSIS — R890 Abnormal level of enzymes in specimens from other organs, systems and tissues: Secondary | ICD-10-CM | POA: Diagnosis not present

## 2019-08-06 DIAGNOSIS — Z5320 Procedure and treatment not carried out because of patient's decision for unspecified reasons: Secondary | ICD-10-CM

## 2019-08-06 HISTORY — PX: LEFT HEART CATH AND CORS/GRAFTS ANGIOGRAPHY: CATH118250

## 2019-08-06 HISTORY — PX: CORONARY STENT INTERVENTION: CATH118234

## 2019-08-06 LAB — GLUCOSE, CAPILLARY
Glucose-Capillary: 186 mg/dL — ABNORMAL HIGH (ref 70–99)
Glucose-Capillary: 195 mg/dL — ABNORMAL HIGH (ref 70–99)
Glucose-Capillary: 224 mg/dL — ABNORMAL HIGH (ref 70–99)
Glucose-Capillary: 235 mg/dL — ABNORMAL HIGH (ref 70–99)

## 2019-08-06 LAB — CBC
HCT: 34.4 % — ABNORMAL LOW (ref 39.0–52.0)
HCT: 33 % — ABNORMAL LOW (ref 39.0–52.0)
Hemoglobin: 11.3 g/dL — ABNORMAL LOW (ref 13.0–17.0)
Hemoglobin: 11 g/dL — ABNORMAL LOW (ref 13.0–17.0)
MCH: 32 pg (ref 26.0–34.0)
MCH: 32.3 pg (ref 26.0–34.0)
MCHC: 32.8 g/dL (ref 30.0–36.0)
MCHC: 33.3 g/dL (ref 30.0–36.0)
MCV: 97.5 fL (ref 80.0–100.0)
MCV: 96.8 fL (ref 80.0–100.0)
Platelets: 124 10*3/uL — ABNORMAL LOW (ref 150–400)
Platelets: 121 10*3/uL — ABNORMAL LOW (ref 150–400)
RBC: 3.53 MIL/uL — ABNORMAL LOW (ref 4.22–5.81)
RBC: 3.41 MIL/uL — ABNORMAL LOW (ref 4.22–5.81)
RDW: 15.5 % (ref 11.5–15.5)
RDW: 16.1 % — ABNORMAL HIGH (ref 11.5–15.5)
WBC: 8.6 10*3/uL (ref 4.0–10.5)
WBC: 6 10*3/uL (ref 4.0–10.5)
nRBC: 0 % (ref 0.0–0.2)
nRBC: 0 % (ref 0.0–0.2)

## 2019-08-06 LAB — HEPARIN LEVEL (UNFRACTIONATED): Heparin Unfractionated: 0.58 IU/mL (ref 0.30–0.70)

## 2019-08-06 LAB — CREATININE, SERUM
Creatinine, Ser: 5.38 mg/dL — ABNORMAL HIGH (ref 0.61–1.24)
GFR calc Af Amer: 11 mL/min — ABNORMAL LOW (ref >60)
GFR calc non Af Amer: 9 mL/min — ABNORMAL LOW (ref >60)

## 2019-08-06 LAB — MRSA PCR SCREENING: MRSA by PCR: NEGATIVE

## 2019-08-06 LAB — POCT ACTIVATED CLOTTING TIME: Activated Clotting Time: 389 seconds

## 2019-08-06 SURGERY — LEFT HEART CATH AND CORS/GRAFTS ANGIOGRAPHY
Anesthesia: Moderate Sedation

## 2019-08-06 MED ORDER — NITROGLYCERIN IN D5W 200-5 MCG/ML-% IV SOLN: 0.0000 ug/min | INTRAVENOUS | Status: DC

## 2019-08-06 MED ORDER — SODIUM CHLORIDE 0.9 % IV SOLN
INTRAVENOUS | Status: DC | PRN
  Administered 2019-08-06: 14:00:00 1 mg/kg/h via INTRAVENOUS

## 2019-08-06 MED ORDER — ONDANSETRON HCL 4 MG/2ML IJ SOLN
INTRAMUSCULAR | Status: AC
  Filled 2019-08-06: qty 2

## 2019-08-06 MED ORDER — TICAGRELOR 90 MG PO TABS: 90.0000 mg | ORAL_TABLET | Freq: Two times a day (BID) | ORAL | Status: DC

## 2019-08-06 MED ORDER — MIDAZOLAM HCL 2 MG/2ML IJ SOLN
INTRAMUSCULAR | Status: DC | PRN
  Administered 2019-08-06: 1 mg via INTRAVENOUS

## 2019-08-06 MED ORDER — SODIUM CHLORIDE 0.9% FLUSH: 3.0000 mL | INTRAVENOUS | Status: DC | PRN

## 2019-08-06 MED ORDER — IOHEXOL 300 MG/ML  SOLN
INTRAMUSCULAR | Status: DC | PRN
  Administered 2019-08-06: 90 mL via INTRA_ARTERIAL

## 2019-08-06 MED ORDER — PROMETHAZINE HCL 25 MG/ML IJ SOLN: 6.2500 mg | Freq: Four times a day (QID) | INTRAMUSCULAR | 0 refills | Status: DC | PRN

## 2019-08-06 MED ORDER — MORPHINE SULFATE (PF) 2 MG/ML IV SOLN
INTRAVENOUS | Status: AC
  Filled 2019-08-06: qty 1

## 2019-08-06 MED ORDER — SODIUM BICARBONATE-DEXTROSE 150-5 MEQ/L-% IV SOLN
150.0000 meq | INTRAVENOUS | Status: DC
  Administered 2019-08-06: 150 meq via INTRAVENOUS
  Filled 2019-08-06 (×2): qty 1000

## 2019-08-06 MED ORDER — SODIUM CHLORIDE 0.9 % IV SOLN
INTRAVENOUS | Status: DC | PRN
  Administered 2019-08-06: 1000 mL via INTRAVENOUS

## 2019-08-06 MED ORDER — SODIUM CHLORIDE 0.9 % IV SOLN: 250.0000 mL | INTRAVENOUS | Status: DC | PRN

## 2019-08-06 MED ORDER — NITROGLYCERIN IN D5W 200-5 MCG/ML-% IV SOLN
0.0000 ug/min | INTRAVENOUS | Status: DC
  Filled 2019-08-06: qty 250

## 2019-08-06 MED ORDER — TICAGRELOR 90 MG PO TABS
ORAL_TABLET | ORAL | Status: DC | PRN
  Administered 2019-08-06: 180 mg via ORAL

## 2019-08-06 MED ORDER — NITROGLYCERIN 0.4 MG SL SUBL
SUBLINGUAL_TABLET | SUBLINGUAL | Status: AC
  Filled 2019-08-06: qty 1

## 2019-08-06 MED ORDER — SODIUM CHLORIDE 0.9 % WEIGHT BASED INFUSION: 1.0000 mL/kg/h | INTRAVENOUS | Status: DC

## 2019-08-06 MED ORDER — BIVALIRUDIN TRIFLUOROACETATE 250 MG IV SOLR
INTRAVENOUS | Status: AC
  Filled 2019-08-06: qty 250

## 2019-08-06 MED ORDER — SODIUM CHLORIDE 0.9% FLUSH
3.0000 mL | Freq: Two times a day (BID) | INTRAVENOUS | Status: DC
  Administered 2019-08-06: 3 mL via INTRAVENOUS

## 2019-08-06 MED ORDER — NITROGLYCERIN 2 % TD OINT
1.0000 [in_us] | TOPICAL_OINTMENT | Freq: Four times a day (QID) | TRANSDERMAL | Status: DC
  Administered 2019-08-06: 1 [in_us] via TOPICAL
  Filled 2019-08-06: qty 1

## 2019-08-06 MED ORDER — NITROGLYCERIN IN D5W 200-5 MCG/ML-% IV SOLN
0.0000 ug/min | INTRAVENOUS | Status: DC
  Administered 2019-08-06: 5 ug/min via INTRAVENOUS
  Filled 2019-08-06 (×2): qty 250

## 2019-08-06 MED ORDER — HEPARIN SODIUM (PORCINE) 5000 UNIT/ML IJ SOLN: 5000.0000 [IU] | Freq: Three times a day (TID) | INTRAMUSCULAR | Status: DC

## 2019-08-06 MED ORDER — HYDRALAZINE HCL 20 MG/ML IJ SOLN: 10.0000 mg | INTRAMUSCULAR | Status: DC | PRN

## 2019-08-06 MED ORDER — SODIUM BICARBONATE-DEXTROSE 150-5 MEQ/L-% IV SOLN: 150.0000 meq | INTRAVENOUS | Status: DC

## 2019-08-06 MED ORDER — MORPHINE SULFATE (PF) 2 MG/ML IV SOLN
2.0000 mg | INTRAVENOUS | Status: DC | PRN
  Administered 2019-08-06 (×2): 2 mg via INTRAVENOUS
  Filled 2019-08-06: qty 1

## 2019-08-06 MED ORDER — FENTANYL CITRATE (PF) 100 MCG/2ML IJ SOLN
INTRAMUSCULAR | Status: AC
  Filled 2019-08-06: qty 2

## 2019-08-06 MED ORDER — FENTANYL CITRATE (PF) 100 MCG/2ML IJ SOLN
INTRAMUSCULAR | Status: DC | PRN
  Administered 2019-08-06: 50 ug via INTRAVENOUS
  Administered 2019-08-06: 25 ug via INTRAVENOUS

## 2019-08-06 MED ORDER — ASPIRIN 81 MG PO CHEW: 81.0000 mg | CHEWABLE_TABLET | ORAL | Status: DC

## 2019-08-06 MED ORDER — MIDAZOLAM HCL 2 MG/2ML IJ SOLN
INTRAMUSCULAR | Status: AC
  Filled 2019-08-06: qty 2

## 2019-08-06 MED ORDER — FENTANYL CITRATE (PF) 100 MCG/2ML IJ SOLN
INTRAMUSCULAR | Status: DC | PRN
  Administered 2019-08-06: 25 ug via INTRAVENOUS

## 2019-08-06 MED ORDER — SODIUM CHLORIDE 0.9 % WEIGHT BASED INFUSION: 3.0000 mL/kg/h | INTRAVENOUS | Status: DC

## 2019-08-06 MED ORDER — NITROGLYCERIN 1 MG/10 ML FOR IR/CATH LAB
INTRA_ARTERIAL | Status: AC
  Filled 2019-08-06: qty 10

## 2019-08-06 MED ORDER — HEPARIN (PORCINE) IN NACL 1000-0.9 UT/500ML-% IV SOLN
INTRAVENOUS | Status: AC
  Filled 2019-08-06: qty 1000

## 2019-08-06 MED ORDER — TICAGRELOR 90 MG PO TABS
ORAL_TABLET | ORAL | Status: AC
  Filled 2019-08-06: qty 2

## 2019-08-06 MED ORDER — SODIUM CHLORIDE 0.9 % IV SOLN: 250.0000 mL | INTRAVENOUS | 0 refills | Status: DC | PRN

## 2019-08-06 MED ORDER — BIVALIRUDIN BOLUS VIA INFUSION - CUPID
INTRAVENOUS | Status: DC | PRN
  Administered 2019-08-06: 73.275 mg via INTRAVENOUS

## 2019-08-06 MED ORDER — HYDRALAZINE HCL 20 MG/ML IJ SOLN
INTRAMUSCULAR | Status: DC | PRN
  Administered 2019-08-06: 10 mg via INTRAVENOUS

## 2019-08-06 MED ORDER — HYDRALAZINE HCL 20 MG/ML IJ SOLN
INTRAMUSCULAR | Status: AC
  Filled 2019-08-06: qty 1

## 2019-08-06 MED ORDER — IOHEXOL 300 MG/ML  SOLN
INTRAMUSCULAR | Status: DC | PRN
  Administered 2019-08-06: 70 mL

## 2019-08-06 MED ORDER — PROMETHAZINE HCL 25 MG/ML IJ SOLN
6.2500 mg | Freq: Four times a day (QID) | INTRAMUSCULAR | Status: DC | PRN
  Administered 2019-08-06: 6.25 mg via INTRAVENOUS

## 2019-08-06 MED ORDER — CHLORHEXIDINE GLUCONATE CLOTH 2 % EX PADS
6.0000 | MEDICATED_PAD | Freq: Every day | CUTANEOUS | Status: DC
  Administered 2019-08-06: 6 via TOPICAL

## 2019-08-06 MED ORDER — SODIUM CHLORIDE 0.9% FLUSH: 3.0000 mL | Freq: Two times a day (BID) | INTRAVENOUS | Status: DC

## 2019-08-06 MED ORDER — NITROGLYCERIN 0.4 MG SL SUBL
0.4000 mg | SUBLINGUAL_TABLET | SUBLINGUAL | Status: DC | PRN
  Administered 2019-08-06 (×3): 0.4 mg via SUBLINGUAL
  Filled 2019-08-06: qty 1

## 2019-08-06 SURGICAL SUPPLY — 31 items
BALLN TREK RX 3.0X15 (BALLOONS) ×2
BALLN ~~LOC~~ EUPHORA RX 4.0X15 (BALLOONS) ×2
BALLN ~~LOC~~ EUPHORA RX 4.5X15 (BALLOONS) ×2
BALLN ~~LOC~~ TREK RX 4.5X15 (BALLOONS) ×2
BALLOON TREK RX 3.0X15 (BALLOONS) IMPLANT
BALLOON ~~LOC~~ EUPHORA RX 4.0X15 (BALLOONS) IMPLANT
BALLOON ~~LOC~~ EUPHORA RX 4.5X15 (BALLOONS) IMPLANT
BALLOON ~~LOC~~ TREK RX 4.5X15 (BALLOONS) IMPLANT
BIOPATCH RED 1 DISK 7.0 (GAUZE/BANDAGES/DRESSINGS) ×1 IMPLANT
CATH INFINITI 5FR JL4 (CATHETERS) ×1 IMPLANT
CATH INFINITI JR4 5F (CATHETERS) ×1 IMPLANT
CATH VISTA GUIDE 6FR MPA1 (CATHETERS) ×1 IMPLANT
DEVICE EMBOSHIELD NAV6 4.0-7.0 (FILTER) ×1 IMPLANT
DEVICE INFLAT 30 PLUS (MISCELLANEOUS) ×1 IMPLANT
DEVICE SECURE STATLOCK IABP (MISCELLANEOUS) IMPLANT
DEVICE SPIDERFX EMB PROT 5MM (WIRE) ×1 IMPLANT
GLIDESHEATH SLEND SS 6F .021 (SHEATH) IMPLANT
GUIDELINER 6F (CATHETERS) ×1 IMPLANT
KIT MANI 3VAL PERCEP (MISCELLANEOUS) ×2 IMPLANT
KIT TRANSPAC II SGL 4260605 (MISCELLANEOUS) ×2 IMPLANT
NDL PERC 18GX7CM (NEEDLE) IMPLANT
NEEDLE PERC 18GX7CM (NEEDLE) ×2 IMPLANT
PACK CARDIAC CATH (CUSTOM PROCEDURE TRAY) ×2 IMPLANT
SHEATH AVANTI 5FR X 11CM (SHEATH) ×1 IMPLANT
SHEATH AVANTI 6FR X 11CM (SHEATH) ×1 IMPLANT
STENT RESOLUTE ONYX 4.0X38 (Permanent Stent) ×1 IMPLANT
WIRE ASAHI GRAND SLAM 180CM (WIRE) ×1 IMPLANT
WIRE GUIDE ASAHI EXTENSION 165 (WIRE) ×1 IMPLANT
WIRE GUIDERIGHT .035X150 (WIRE) ×1 IMPLANT
WIRE ROSEN-J .035X260CM (WIRE) IMPLANT
WIRE RUNTHROUGH .014X180CM (WIRE) ×1 IMPLANT

## 2019-08-06 NOTE — Discharge Summary (Signed)
[employer] [facility] [facility] [place] [facility]   PATIENT NAME: [name]    MR#:  [id number]  DATE OF BIRTH:  08-Jun-1941  DATE OF ADMISSION:  08/04/2019 ADMITTING PHYSICIAN: Jimmye Norman, NP  DATE OF DISCHARGE: 08/06/2019  PRIMARY CARE PHYSICIAN: Reubin Milan, MD   ADMISSION DIAGNOSIS:  NSTEMI (non-ST elevated myocardial infarction) (HCC) [I21.4] Chest pain [R07.9]  DISCHARGE DIAGNOSIS:  Active Problems:   Chest pain with high risk of acute coronary syndrome   SECONDARY DIAGNOSIS:   Past Medical History:  Diagnosis Date  . Blood transfusion without reported diagnosis    patient unaware of receiving blood unless it was during surgery and he was not told  . Cancer [facility]) 2001   Colon resection  . CAP (community acquired pneumonia) 08/11/2017  . Carotid stenosis, symptomatic, with infarction (HCC) 07/17/2017  . Chronic kidney disease 11/2017   stage IV CKD per nephrologist.  . Complication of anesthesia    raspy voice since carotid endarterectomy 07/17/17. paralysis of vocal chords  . Coronary artery disease   . Diabetes mellitus without complication (HCC)   . Dysrhythmia    patient unaware of any irregular heart rhythms  . Hyperlipidemia   . Hypertension   . Hypothyroidism   . Kidney mass 2019  . Myocardial infarction (HCC) 1997  . Peripheral vascular disease (HCC)   . Pneumonia 2014, 2018   developed after surgery 2018  . Stroke (HCC) 07/2017   mild stroke and then had carotid endarterectomy  . Thyroid disease   . Wears dentures    full upper  . Weight loss 2019   patient has lost over 60 pounds since 07/2017.      ADMITTING HISTORY  HISTORY OF PRESENT ILLNESS:  78 y.o. male with pertinent past medical history of CAD s/p CABG (1997), stage IV CKD, bilateral carotid stenosis, PVD, MI, CVA, hypertension, hyperlipidemia, hypothyroidism, and kidney mass presenting to the ED with chief complaints of chest pain.  Patient report onset of  chest pain since suppertime last evening.  Described pain as dull and heavy " as if somebody sitting on my chest" without associated diaphoresis, nausea vomiting, dizziness, headache, abdominal pain, diarrhea or radiation to any extremity neck or back.  Patient immediately dialed 911 and was advised to take aspirin prior to arrival of EMS.  Patient received nitroglycerin by EMS on route to the ED.  On arrival to the ED, he was afebrile with blood pressure 170/76 mm Hg and pulse rate 61 beats/min. There were no focal neurological deficits; he was alert and oriented x4, and he did not demonstrate any memory deficits.  Patient reported he is chest pain free following admission of nitroglycerin.  Labs revealed potassium 5.2, glucose 336, BUN 60, creatinine 5.91, hemoglobin 11.7, platelets 129, initial troponin 471, repeat 507. ECG shows Normal sinus rhythm, rate of 64, normal intervals, normal axis, minimal ST depressions on lateral leads with no ST elevation. He is however noted to be going in and out atrial fibrillation. Hospitalist ask to admit for further management.   HOSPITAL COURSE:   78 y.o.malewith pertinent past medical history ofCAD s/p CABG (1997), stage IV CKD, bilateral carotid stenosis, PVD, MI, CVA, hypertension, hyperlipidemia, hypothyroidism, and kidney mass presenting to the ED with chief complaints of chest pain.  *NSTEMI Admitted to telemetry floor and started on heparin drip. ASA, statin, BB He had nitropaste and nitro SL. Continued to have chest pain and was taken to Cardiac cath. He had complication of perforation  of vein during intervention and will be transferred to [facility] for further monitoring and care. Dr. Okey Dupre has discussed with Dr. Delton See who has accepted the patient.  * Intermittent heart block                                              Asymptomatic. 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 Wynelle Link High risk for worsening with cardiac cath. May need HD   *HLD  + Goal LDL<100 - Atorvastatin 80mg  PO qhs - Check lipid pane  *HTN  + Goal BP <130/80 -Continue hydralazine  *Diabetes mellitus - SSI  *Hypothyroidism -Continue Synthroid  Transfer to [facility] once bed available  CONSULTS OBTAINED:  Treatment Team:  Lamar Blinks, MD  DRUG ALLERGIES:   Allergies  Allergen Reactions  . Ace Inhibitors Other (See Comments)    Reaction:  Raises potassium   . Quinapril Rash and Other (See Comments)    hyperkalemia    DISCHARGE MEDICATIONS:   Allergies as of 08/06/2019      Reactions   Ace Inhibitors Other (See Comments)   Reaction:  Raises potassium    Quinapril Rash, Other (See Comments)   hyperkalemia      Medication List    TAKE these medications   acetaminophen 500 MG tablet Commonly known as: TYLENOL Take 1,000 mg every 6 (six) hours as needed by mouth for moderate pain or headache.   atenolol 50 MG tablet Commonly known as: TENORMIN TAKE 1 TABLET BY MOUTH ONCE DAILY   atorvastatin 40 MG tablet Commonly known as: LIPITOR TAKE 1 TABLET BY MOUTH ONCE DAILY AT  6PM What changed: See the new instructions.   calcitRIOL 0.5 MCG capsule Commonly known as: ROCALTROL Take 0.5 mcg by mouth daily.   clopidogrel 75 MG tablet Commonly known as: PLAVIX Take 1 tablet by mouth once daily   EQ Aspirin Adult Low Dose 81 MG EC tablet Generic drug: aspirin TAKE 1  BY MOUTH ONCE DAILY What changed: See the new  instructions.   Fifty50 Glucose Meter 2.0 w/Device Kit 1 Device by Other route 2 (two) times daily.   insulin NPH Human 100 UNIT/ML  injection Commonly known as: NOVOLIN N Inject 0.25 mLs (25 Units total) into the skin 2 (two) times daily. What changed: how much to take   insulin regular 100 units/mL injection Commonly known as: NOVOLIN R Inject 12 Units into the skin 2 (two) times daily.   levothyroxine 300 MCG tablet Commonly known as: SYNTHROID Take 300 mcg by mouth daily before breakfast.   ReliOn Prime Test test strip Generic drug: glucose blood 1 strip by Other route 2 (two) times daily.   sodium bicarbonate 650 MG tablet Take 650 mg by mouth 2 (two) times daily.   valACYclovir 1000 MG tablet Commonly known as: VALTREX Take 1 tablet (1,000 mg total) by mouth 3 (three) times daily.       Today   VITAL SIGNS:  Blood pressure (!) 152/109, pulse 71, temperature 97.7 F (36.5 C), temperature source Oral, resp. rate 20, height 6\' 2"  (1.88 m), weight 97.7 kg, SpO2 98 %.  I/O:    Intake/Output Summary (Last 24 hours) at 08/06/2019 1726 Last data filed at 08/06/2019 1200 Gross per 24 hour  Intake 2194.23 ml  Output 600 ml  Net 1594.23 ml    PHYSICAL EXAMINATION:  Physical Exam  GENERAL:  78 y.o.-year-old patient lying in the bed with no acute distress.  LUNGS: Normal breath sounds bilaterally, no wheezing, rales,rhonchi or crepitation. No use of accessory muscles of respiration.  CARDIOVASCULAR: S1, S2 normal. No murmurs, rubs, or gallops.  ABDOMEN: Soft, non-tender, non-distended. Bowel sounds present. No organomegaly or mass.  NEUROLOGIC: Moves all 4 extremities. PSYCHIATRIC: The patient is alert and oriented x 3.  SKIN: No obvious rash, lesion, or ulcer.   DATA REVIEW:   CBC Recent Labs  Lab 08/06/19 0636  WBC 8.6  HGB 11.3*  HCT 34.4*  PLT 124*    Chemistries  Recent Labs  Lab 08/05/19 0439  NA 138  K 4.5  CL 111  CO2 18*  GLUCOSE 210*  BUN 58*  CREATININE 5.72*  CALCIUM 8.2*    Cardiac Enzymes No results for input(s): TROPONINI in the last 168  hours.  Microbiology Results  Results for orders placed or performed during the hospital encounter of 08/04/19  MRSA PCR Screening     Status: None   Collection Time: 08/04/19 10:47 PM   Specimen: Nasal Mucosa; Nasopharyngeal  Result Value Ref Range Status   MRSA by PCR NEGATIVE NEGATIVE Final    Comment:        The GeneXpert MRSA Assay (FDA approved for NASAL specimens only), is one component of a comprehensive MRSA colonization surveillance program. It is not intended to diagnose MRSA infection nor to guide or monitor treatment for MRSA infections. Performed at [facility], [address] [place], [place] [address]   SARS Coronavirus 2 by RT PCR (hospital order, performed in [facility] hospital lab) Nasopharyngeal Nasopharyngeal Swab     Status: None   Collection Time: 08/05/19 12:19 AM   Specimen: Nasopharyngeal Swab  Result Value Ref Range Status   SARS Coronavirus 2 NEGATIVE NEGATIVE Final    Comment: (NOTE) If result is NEGATIVE SARS-CoV-2 target nucleic acids are NOT DETECTED. The SARS-CoV-2 RNA is generally detectable in upper and lower  respiratory specimens during the acute phase of infection. The lowest  concentration of SARS-CoV-2 viral copies this assay can detect is 250  copies /  mL. A negative result does not preclude SARS-CoV-2 infection  and should not be used as the sole basis for treatment or other  patient management decisions.  A negative result may occur with  improper specimen collection / handling, submission of specimen other  than nasopharyngeal swab, presence of viral mutation(s) within the  areas targeted by this assay, and inadequate number of viral copies  (<250 copies / mL). A negative result must be combined with clinical  observations, patient history, and epidemiological information. If result is POSITIVE SARS-CoV-2 target nucleic acids are DETECTED. The SARS-CoV-2 RNA is generally detectable in upper and lower  respiratory  specimens dur ing the acute phase of infection.  Positive  results are indicative of active infection with SARS-CoV-2.  Clinical  correlation with patient history and other diagnostic information is  necessary to determine patient infection status.  Positive results do  not rule out bacterial infection or co-infection with other viruses. If result is PRESUMPTIVE POSTIVE SARS-CoV-2 nucleic acids MAY BE PRESENT.   A presumptive positive result was obtained on the submitted specimen  and confirmed on repeat testing.  While 2019 novel coronavirus  (SARS-CoV-2) nucleic acids may be present in the submitted sample  additional confirmatory testing may be necessary for epidemiological  and / or clinical management purposes  to differentiate between  SARS-CoV-2 and other Sarbecovirus currently known to infect humans.  If clinically indicated additional testing with an alternate test  methodology [phone]) is advised. The SARS-CoV-2 RNA is generally  detectable in upper and lower respiratory sp ecimens during the acute  phase of infection. The expected result is Negative. Fact Sheet for Patients:  BoilerBrush.com.cy Fact Sheet for Healthcare Providers: https://pope.com/ This test is not yet approved or cleared by the [place] FDA and has been authorized for detection and/or diagnosis of SARS-CoV-2 by FDA under an Emergency Use Authorization (EUA).  This EUA will remain in effect (meaning this test can be used) for the duration of the COVID-19 declaration under Section 564(b)(1) of the Act, 21 U.S.C. section 360bbb-3(b)(1), unless the authorization is terminated or revoked sooner. Performed at [facility], [address] [place] [place], [place] [address]     RADIOLOGY:  Dg Chest Port 1 View  Result Date: 08/04/2019 CLINICAL DATA:  Chest pain EXAM: PORTABLE CHEST 1 VIEW COMPARISON:  08/10/2017 FINDINGS: Post sternotomy changes.  Chronic elevation of right diaphragm. No acute airspace disease or effusion. Stable cardiomediastinal silhouette with aortic atherosclerosis. No pneumothorax. IMPRESSION: No active disease.  Stable chronic elevation of right diaphragm Electronically Signed   By: Jasmine Pang M.D.   On: 08/04/2019 23:28    Follow up with PCP in 1 week.  Management plans discussed with the patient, family and they are in agreement.  CODE STATUS:     Code Status Orders  (From admission, onward)         Start     Ordered   08/05/19 0037  Do not attempt resuscitation (DNR)  Continuous    Question Answer Comment  In the event of cardiac or respiratory ARREST Do not call a "code blue"   In the event of cardiac or respiratory ARREST Do not perform Intubation, CPR, defibrillation or ACLS   In the event of cardiac or respiratory ARREST Use medication by any route, position, wound care, and other measures to relive pain and suffering. May use oxygen, suction and manual treatment of airway obstruction as needed for comfort.      08/05/19 0037  Code Status History    Date Active Date Inactive Code Status Order ID Comments User Context   11/14/2017 0327 11/15/2017 2109 DNR [id number]  Ihor Austin, MD Inpatient   08/11/2017 0200 08/15/2017 1741 DNR [id number]  Bertrum Sol, MD Inpatient   07/17/2017 1837 07/18/2017 1855 Full Code [id number]  Annice Needy, MD Inpatient   06/11/2017 2231 06/14/2017 1638 Full Code [id number]  [place], [place], DO Inpatient   12/25/2016 1531 12/26/2016 1631 DNR [id number]  Milagros Loll, MD ED   04/23/2016 1320 04/26/2016 2217 Full Code [id number]  Salley Hews, MD Inpatient   04/21/2016 0213 04/23/2016 1320 Full Code [id number]  Gonczy, [place], MD ED   Advance Care Planning Activity    Advance Directive Documentation     Most Recent Value  Type of Advance Directive  Healthcare Power of Attorney, Out of facility DNR (pink MOST or yellow form)  Pre-existing out of facility DNR order  (yellow form or pink MOST form)  -  "MOST" Form in Place?  -      TOTAL TIME TAKING CARE OF THIS PATIENT ON DAY OF DISCHARGE: more than 30 minutes.   Molinda Bailiff Esley Brooking M.D on 08/06/2019 at 5:26 PM  Between 7am to 6pm - Pager - [phone]  After 6pm go to [email or web address] - password [email or web address] [facility]  [facility] [place] [facility]  Office  [phone]  CC: Primary care physician; Reubin Milan, MD  Note: This dictation was prepared with Dragon dictation along with smaller phrase technology. Any transcriptional errors that result from this process are unintentional.

## 2019-08-06 NOTE — Progress Notes (Addendum)
[facility] Cardiology  SUBJECTIVE:   [name] is a 78 y.o. with a past medical history significant for coronary artery disease s/p CABG x3 with a LIMA to LAD, SVG to OD, and SVG to RCA in 1997, carotid artery disease s/p endarterectomy x2, type 2 diabetes mellitus, HTN, HLD, CKD, and prior stroke, who presented to the ED on evening of 10/20 for chest pain. His troponins have been elevated but have now plateaued (417 > 507 > 1266 > 1123). We were unable to perform an cardiac interventions given acute on chronic renal failure with creatinine of 5.7.   Patient states that he begun having chest pain this morning that felt like the pain that brought him to the ER initially. He has received three sublingual nitroglycerine tablets with improvement of his pain. His EKG has remained unchanged. Blood pressure is now improved as compared to previous. He continues to deny lightheadedness, dizziness, syncope, leg swelling, or other cardiovascular concerns.  Proceeded to cardiac catheterization due to continued episodes of chest discomfort showing Patent LIMA to the LAD Occluded saphenous vein graft obtuse marginal 1 Significant stenosis and thrombosis of proximal right coronary artery graft Vitals:   08/05/19 1828 08/05/19 1834 08/05/19 2032 08/06/19 0520  BP: (!) 142/58 (!) 163/71 (!) 190/78 (!) 144/72  Pulse: 61 70 (!) 55 62  Resp:   20 20  Temp:   97.6 F (36.4 C) 97.7 F (36.5 C)  TempSrc:   Oral Oral  SpO2:   99% 99%  Weight:      Height:         Intake/Output Summary (Last 24 hours) at 08/06/2019 0725 Last data filed at 08/06/2019 0400 Gross per 24 hour  Intake 1712.12 ml  Output 1325 ml  Net 387.12 ml      PHYSICAL EXAM  General: Well developed, well nourished, in no acute distress HEENT:  Normocephalic and atramatic Neck:  No JVD.  Lungs: Clear bilaterally to auscultation and percussion. Heart: HRRR . Normal S1 and S2 without gallops or murmurs.  Abdomen: Bowel sounds are  positive, abdomen soft and non-tender  Msk:  Back normal, normal gait. Normal strength and tone for age. Extremities: No clubbing, cyanosis or edema.   Neuro: Alert and oriented X 3. Psych:  Good affect, responds appropriately   LABS: Basic Metabolic Panel: Recent Labs    08/04/19 2250 08/05/19 0439  NA 136 138  K 5.2* 4.5  CL 107 111  CO2 21* 18*  GLUCOSE 336* 210*  BUN 60* 58*  CREATININE 5.91* 5.72*  CALCIUM 8.3* 8.2*   Liver Function Tests: No results for input(s): AST, ALT, ALKPHOS, BILITOT, PROT, ALBUMIN in the last 72 hours. No results for input(s): LIPASE, AMYLASE in the last 72 hours. CBC: Recent Labs    08/05/19 0439 08/06/19 0636  WBC 8.1 8.6  HGB 11.6* 11.3*  HCT 35.6* 34.4*  MCV 98.1 97.5  PLT 121* 124*   Cardiac Enzymes: No results for input(s): CKTOTAL, CKMB, CKMBINDEX, TROPONINI in the last 72 hours. BNP: Invalid input(s): POCBNP D-Dimer: No results for input(s): DDIMER in the last 72 hours. Hemoglobin A1C: Recent Labs    08/05/19 0439  HGBA1C 8.5*   Fasting Lipid Panel: Recent Labs    08/05/19 0439  CHOL 131  HDL 18*  LDLCALC 82  TRIG 156*  CHOLHDL 7.3   Thyroid Function Tests: Recent Labs    08/05/19 0439  TSH 2.728   Anemia Panel: No results for input(s): VITAMINB12, FOLATE, FERRITIN, TIBC, IRON,  RETICCTPCT in the last 72 hours.  Dg Chest Port 1 View  Result Date: 08/04/2019 CLINICAL DATA:  Chest pain EXAM: PORTABLE CHEST 1 VIEW COMPARISON:  08/10/2017 FINDINGS: Post sternotomy changes. Chronic elevation of right diaphragm. No acute airspace disease or effusion. Stable cardiomediastinal silhouette with aortic atherosclerosis. No pneumothorax. IMPRESSION: No active disease.  Stable chronic elevation of right diaphragm Electronically Signed   By: Donavan Foil M.D.   On: 08/04/2019 23:28     ASSESSMENT AND PLAN:  Active Problems:   Chest pain with high risk of acute coronary syndrome   Mr. [name] is a 78 y.o. with a past  medical history significant for coronary artery disease s/p CABG x3 with a LIMA to LAD, SVG to OD, and SVG to RCA in 1997, carotid artery disease s/p endarterectomy x2, type 2 diabetes mellitus, CKD, HTN, HLD, and prior stroke, who presented last night following an episode of sudden onset chest pain described as a pressure to the center of his chest. Patient has also had acute on chronic kidney dysfunction with creatinine of 5.91 (up from baseline of 3.9). He is not on dialysis. Chest pain has recurred this AM, however, improved after nitro.   Chest pain / Elevation troponin Given known history of coronary artery disease, chest pain that was relieved with nitroglycerine and elevated troponins, concern for possible ACS. Troponin 471 > 507 > 1266 > 1123. Patient begun having chest pain again this morning, however, we are unable to proceed to cardiac catheterization at this time due to patient's creatinine level (5.72). He has received sublingual nitroglycerine and nitro paste with improvement of his symptoms.   -Proceed to cardiac catheterization and PCI and stent placement with continued treatment of thrombosis and stenosis of graft to right coronary artery -Dual antiplatelet therapy after above -Proceed to dialysis as necessary thereafter  Intermittent completed heart block:  Patient found to have intermittent heart block on telemetry, likely related to atenolol and worsening kidney function. There is no evidence of atrial fibrillation on any saved strips or EKG. Patient is asymptomatic currently. Continue to hold atenolol and continue to closely monitor on telemetry.  - Discontinue atenolol  - Consider use of hydralazine for blood pressure control   The patient's history and exam findings were discussed with Dr. Nehemiah Massed. The plan was made in conjunction with Dr. Nehemiah Massed.   Hilbert Odor, MD, PhD, [facility] 08/06/2019 7:25 AM

## 2019-08-06 NOTE — Progress Notes (Addendum)
[facility] Post-PCI Note  Date: 08/06/19 Time: 7:36 PM  Subjective: Patient evaluated several times following PCI to SVG-RCA complicated by small perforation of SVG at time of stent deployment.  Patient developed nausea after being moved from the cath table, for which he received Zofran.  Upon arrival in the ICU, he reported repeat nausea and was given Phenergan with resolution.  He was also given prn morphine for headache and restlessness.  [name] has subsequently become more disoriented.  He states that he is in a nightmare.  He intermittently endorses chest pain, but when questioned further, he denies it.  Foley catheter was placed, yielding ~2 L of urine.  Objective: Somnolent but arouses to voice.  Heart sounds are distant but regular without murmurs.  Lungs are clear anteriorly.  No right hematoma, with femoral sheath still in place.  Right radial pulse is trace, left radial pulse is 1+.  Significant discrepency in blood pressure noted between femoral a-line and left arm cuff pressure (both similar) and right arm cuff pressure (systolic pressure ~54 mmHg lower).  Post-catheterization labs notable for hemoglobin 11.0 (down form 11.3), platelets 121, and creatinine 5.4 (donw from 5.7 yesterday).  Post-cath EKG personally reviewed and shows baseline artifact but likely NSR with 1st degree AV block and PAC's.  Non-specific ST/T changes are less pronounced.  Assessment/Plan: [name] is a 78 y/o man with extensive cardiovascular history included CAD s/p remote CABG, stroke and right CEA (left ICA occluded), and acute on chronic kidney failure, admitted with NSTEMI.  He underwent PCI to SVG-RCA complicated by small perforation at site of stent placement.  This was controlled with internal tamponade and no further contrast extravasation was evident on final images.  He has remained hypertensive but significant discrepency between right arm and other blood pressures noted.  It is unclear how  acute this is, though bilateral subclavian artery flow dynamics were noted be normal on carotid Doppler in 03/2019.  While right subclavian or brachial stenosis could be present, in light of today's events, aortic injury or enlarging mediastinal hematoma are considerations.  Mental status has also worsened this evening, though this is in the setting of having received multiple medications, including midazolam and fentanyl during the catheterization and subsequent Zofran, Phenergan, and morphine.  His family also notes some confusion at baseline.  I have ordered a STAT CXR to assess for mediastinal widening or other findings of hematoma.  We will have a low threshold for CT scan of the chest.  The patient is currently awaiting transport to [facility].  Catheterization findings have been discussed with the admitting and interventional cardiology teams.  Immediately following PCI, code status was readdressed with the patient and his family (wife and children).  Both the patient and his family indicated that [name] would wish to undergo CPR, intubation, and other ACLS procedures if there was a chance of meaningful recovery.  He would not wish to remain on life support indefinitely.  I have therefore changed his code status to full code.  Critical care time: 35 minutes were spent examining the patient, reviewing his labs and prior studies (including today's coronary angiogram and prior carotid Doppler), and managing his medications in anticipation of transfer to [facility].  Nelva Bush, MD [facility] Pager: [phone]

## 2019-08-06 NOTE — Progress Notes (Signed)
Post meds pt continues to c/o chest pain and tightness/ Dr. Darvin Neighbours and Dr. Nehemiah Massed aware/ orders to start nitro gtt/ transfer to CCU

## 2019-08-06 NOTE — Progress Notes (Signed)
ANTICOAGULATION CONSULT NOTE  Pharmacy Consult for: Heparin Indication: chest pain/ACS  Allergies  Allergen Reactions  . Ace Inhibitors Other (See Comments)    Reaction:  Raises potassium   . Quinapril Rash and Other (See Comments)    hyperkalemia    Patient Measurements: Height: 6\' 2"  (188 cm) Weight: 215 lb (97.5 kg) IBW/kg (Calculated) : 82.2 Heparin Dosing Weight: 97.5 kg  Vital Signs: Temp: 97.7 F (36.5 C) (10/23 0520) Temp Source: Oral (10/23 0520) BP: 144/72 (10/23 0520) Pulse Rate: 62 (10/23 0520)  Labs: Recent Labs    08/04/19 2250 08/05/19 0043 08/05/19 0439 08/05/19 0800 08/05/19 0818 08/05/19 1618 08/05/19 1624 08/06/19 0636  HGB 11.7*  --  11.6*  --   --   --   --  11.3*  HCT 36.0*  --  35.6*  --   --   --   --  34.4*  PLT 129*  --  121*  --   --   --   --  124*  APTT 33  --   --   --   --   --   --   --   LABPROT 13.8  --   --   --   --   --   --   --   INR 1.1  --   --   --   --   --   --   --   HEPARINUNFRC  --   --   --  0.52  --  0.50  --  0.58  CREATININE 5.91*  --  5.72*  --   --   --   --   --   TROPONINIHS 471* 507*  --   --  1,266*  --  1,123*  --     Estimated Creatinine Clearance: 12.2 mL/min (A) (by C-G formula based on SCr of 5.72 mg/dL (H)).   Medical History: Past Medical History:  Diagnosis Date  . Blood transfusion without reported diagnosis    patient unaware of receiving blood unless it was during surgery and he was not told  . Cancer [facility]) 2001   Colon resection  . CAP (community acquired pneumonia) 08/11/2017  . Carotid stenosis, symptomatic, with infarction ([place]) 07/17/2017  . Chronic kidney disease 11/2017   stage IV CKD per nephrologist.  . Complication of anesthesia    raspy voice since carotid endarterectomy 07/17/17. paralysis of vocal chords  . Coronary artery disease   . Diabetes mellitus without complication ([place])   . Dysrhythmia    patient unaware of any irregular heart rhythms  . Hyperlipidemia   .  Hypertension   . Hypothyroidism   . Kidney mass 2019  . Myocardial infarction ([place]) 1997  . Peripheral vascular disease ([place])   . Pneumonia 2014, 2018   developed after surgery 2018  . Stroke ([place]) 07/2017   mild stroke and then had carotid endarterectomy  . Thyroid disease   . Wears dentures    full upper  . Weight loss 2019   patient has lost over 60 pounds since 07/2017.     Medications:  Scheduled:  . aspirin EC  81 mg Oral Daily  . atorvastatin  80 mg Oral q1800  . calcitRIOL  0.5 mcg Oral Daily  . clopidogrel  75 mg Oral Daily  . hydrALAZINE  50 mg Oral Q8H  . insulin aspart  0-5 Units Subcutaneous QHS  . insulin aspart  0-9 Units Subcutaneous TID WC  . levothyroxine  300  mcg Oral QAC breakfast  . sodium bicarbonate  650 mg Oral BID  . valACYclovir  1,000 mg Oral TID    Assessment: Patient arrives to ED w/ c/o chest pain w/ h/o CABG in 97 and DM, CKD, HTN developed rapid onset of CP and found to have trop of 471 last night w/ EKG showing non-specific T-wave abnormalities. No anticoagulation PTA aside from plavix for prior stroke. Patient is being started on heparin drip for NSTEMI. Heparin level of 0.52 within goal x1.  Goal of Therapy:  Heparin level 0.3-0.7 units/ml Monitor platelets by anticoagulation protocol: Yes   Plan:  10/23 @ 0600 HL 0.58 therapeutic. Will continue current rate and will recheck HL w/ am labs, CBC stable will continue to monitor.  Tobie Lords, PharmD, BCPS Clinical Pharmacist 08/06/2019

## 2019-08-06 NOTE — Progress Notes (Signed)
Pt lying in bed AAox4 . Pt. Has no c/o pain or distress or discomfort. Pt remains on nitro@100mcg  and NA Bicarb @50ml . Pt has A-line in place which is coordinating with left arm cuff pressure. Pt being transferred to [place] 2H rm 22. Spoke with Percell Miller RN who will be receiving patient. ,report given and accepted. Care link is here to receive patient to be transferred paperwork provided. Pt wife [name] notified  [phone] . Pt has left facility awake and alert no distress noted.

## 2019-08-06 NOTE — H&P (Signed)
Cardiology Admission History and Physical:   Patient ID: [name] MRN: [id number]; DOB: 11/22/1940   Admission date: (Not on file)  Primary Care Provider: Glean Hess, MD Primary Cardiologist: Jordan Hawks, MD Primary Electrophysiologist:  None   Chief Complaint:  Chest pain  Note primarily written by Dr Nelva Bush, with some edits by Dr Lanna Poche on patient's arrival to [facility].   Patient Profile:   [name] is a 78 y.o. male with coronary artery disease s/p CABG x3 with a LIMA to LAD, SVG to OM, and SVG to RCA in 1997, carotid artery disease s/p endarterectomy x2, type 2 diabetes mellitus, HTN, HLD, CKD, and prior stroke, admitted to [facility] with chest pain and elevated troponin consistent with NSTEMI.  History of Present Illness:   Mr. Amsden presented to the ED at [facility] 2 days ago with acute onset of chest discomfort while eating dinner.  There were no associated symptoms but given persistent pain, he called 911.  Discomfort resolved with sublingual nitroglycerin x2 by EMS.  In the ER, he was noted to have a mildly elevated high-sensitivity troponin and as well as acute on chronic kidney failure.  Over the last day, however, he has experienced recurrent chest pain despite aggressive antianginal therapy.  His troponin also rose to 1266.  Given these findings, he underwent cardiac catheterization by Dr. Nehemiah Massed earlier today, knowing that contrast would likely worsen his renal function and lead to initiation of dialysis during this admission.  Catheterization was notable for chronic occlusion of the SVG to OM.  LIMA to LAD was widely patent.  SVG to distal RCA showed severe mid vessel disease felt to be his culprit lesion.  During the intervention, the stenosis was successfully treated but complicated by focal perforation of the vein graft.  Contrast extravasation was controlled with internal balloon tamponade.  However, a contained perforation was still  evident.  Decision was made to transfer to [facility] for monitoring and potential endovascular versus surgical intervention where the patient to decompensate.  At this time, Mr. [name] he is chest pain-free.  He denies shortness of breath as well.  He has experienced some nausea following the catheterization.\  On arrival to [facility], the patient has no complaints. He is feeling well.  Heart Pathway Score:     Past Medical History:  Diagnosis Date   Blood transfusion without reported diagnosis    patient unaware of receiving blood unless it was during surgery and he was not told   Cancer [facility]) 2001   Colon resection   CAP (community acquired pneumonia) 08/11/2017   Carotid stenosis, symptomatic, with infarction ([place]) 07/17/2017   Chronic kidney disease 11/2017   stage IV CKD per nephrologist.   Complication of anesthesia    raspy voice since carotid endarterectomy 07/17/17. paralysis of vocal chords   Coronary artery disease    Diabetes mellitus without complication [facility])    Dysrhythmia    patient unaware of any irregular heart rhythms   Hyperlipidemia    Hypertension    Hypothyroidism    Kidney mass 2019   Myocardial infarction [facility]) 1997   Peripheral vascular disease ([place])    Pneumonia 2014, 2018   developed after surgery 2018   Stroke ([place]) 07/2017   mild stroke and then had carotid endarterectomy   Thyroid disease    Wears dentures    full upper   Weight loss 2019   patient has lost over 60 pounds since 07/2017.  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   COLONOSCOPY WITH PROPOFOL N/A 10/26/2018   Procedure: COLONOSCOPY WITH PROPOFOL;  Surgeon: Lucilla Lame, MD;  Location: [place];  Service: Endoscopy;  Laterality: N/A;  Diabetic - insulin   CORONARY ARTERY BYPASS GRAFT  1997   x 3   CYSTOSCOPY W/ RETROGRADES Bilateral 09/03/2017   Procedure: CYSTOSCOPY WITH RETROGRADE PYELOGRAM;  Surgeon: Hollice Espy, MD;  Location: [facility];  Service: Urology;  Laterality: Bilateral;   CYSTOSCOPY W/ RETROGRADES Right 01/28/2018   Procedure: CYSTOSCOPY WITH RETROGRADE PYELOGRAM;  Surgeon: Hollice Espy, MD;  Location: [facility];  Service: Urology;  Laterality: Right;   CYSTOSCOPY W/ URETERAL STENT REMOVAL  08/2017   CYSTOSCOPY WITH BIOPSY Right 01/28/2018   Procedure: CYSTOSCOPY WITH BIOPSY;  Surgeon: Hollice Espy, MD;  Location: [facility];  Service: Urology;  Laterality: Right;   CYSTOSCOPY WITH STENT PLACEMENT Right 09/03/2017   Procedure: CYSTOSCOPY WITH STENT PLACEMENT;  Surgeon: Hollice Espy, MD;  Location: [facility];  Service: Urology;  Laterality: Right;   CYSTOSCOPY WITH STENT PLACEMENT Right 01/28/2018   Procedure: CYSTOSCOPY WITH STENT PLACEMENT;  Surgeon: Hollice Espy, MD;  Location: [facility];  Service: Urology;  Laterality: Right;   CYSTOSCOPY WITH URETEROSCOPY Right 01/28/2018   Procedure: CYSTOSCOPY WITH URETEROSCOPY;  Surgeon: Hollice Espy, MD;  Location: [facility];  Service: Urology;  Laterality: Right;   ENDARTERECTOMY Right 07/17/2017   Procedure: ENDARTERECTOMY CAROTID;  Surgeon: Algernon Huxley, MD;  Location: [facility];  Service: Vascular;  Laterality: Right;   HERNIA REPAIR  2011   Ventral hernia   HOLMIUM LASER APPLICATION N/A 01/77/9390   Procedure: HOLMIUM LASER APPLICATION;  Surgeon: Hollice Espy, MD;  Location: [facility];  Service: Urology;  Laterality: N/A;   KNEE SURGERY Left     arthroscopy   KYPHOPLASTY N/A 08/14/2017   Procedure: [id number];  Surgeon: Hessie Knows, MD;  Location: [facility];  Service: Orthopedics;  Laterality: N/A;   LARYNX SURGERY     POLYPECTOMY  10/26/2018   Procedure: POLYPECTOMY INTESTINAL;  Surgeon: Lucilla Lame, MD;  Location: [place];  Service: Endoscopy;;  Descending colon polyp Transverse colon polyps x 3   PROSTATE SURGERY  2002   BPH benign pathology   SPINE SURGERY  1989   Lumbar disc   THYROPLASTY Right 01/05/2018   Procedure: THYROPLASTY;  Surgeon: Beverly Gust, MD;  Location: [facility];  Service: ENT;  Laterality: Right;     Medications Prior to Admission: Prior to Admission medications   Medication Sig Start Date End Date Taking? Authorizing Provider  acetaminophen (TYLENOL) 500 MG tablet Take 1,000 mg every 6 (six) hours as needed by mouth for moderate pain or headache.    [provider]  atenolol (TENORMIN) 50 MG tablet TAKE 1 TABLET BY MOUTH ONCE DAILY Patient taking differently: Take 50 mg by mouth daily.  10/30/18   Glean Hess, MD  atorvastatin (LIPITOR) 40 MG tablet TAKE 1 TABLET BY MOUTH ONCE  DAILY AT  6PM Patient taking differently: Take 40 mg by mouth daily at 6 PM.  12/03/18   Glean Hess, MD  Blood Glucose Monitoring Suppl (FIFTY50 GLUCOSE METER 2.0) w/Device KIT 1 Device by Other route 2 (two) times daily. 12/08/17   [provider]  calcitRIOL (ROCALTROL) 0.5 MCG capsule Take 0.5 mcg by mouth daily. 07/05/19   [provider]  clopidogrel (PLAVIX) 75 MG tablet Take 1 tablet by mouth once daily Patient taking differently: Take 75 mg by mouth daily.  05/19/19   Glean Hess, MD  EQ ASPIRIN ADULT LOW DOSE 81 MG EC tablet TAKE 1  BY MOUTH ONCE DAILY Patient taking differently: Take 81 mg by mouth daily.  11/04/18   Algernon Huxley, MD  glucose blood (RELION PRIME TEST) test strip 1 strip by Other route 2 (two) times daily. 07/09/17   [provider]    insulin NPH Human (HUMULIN N,NOVOLIN N) 100 UNIT/ML injection Inject 0.25 mLs (25 Units total) into the skin 2 (two) times daily. Patient taking differently: Inject 30 Units into the skin 2 (two) times daily.  08/15/17   Vaughan Basta, MD  insulin regular (NOVOLIN R,HUMULIN R) 100 units/mL injection Inject 12 Units into the skin 2 (two) times daily.     [provider]  levothyroxine (SYNTHROID, LEVOTHROID) 300 MCG tablet Take 300 mcg by mouth daily before breakfast.     [provider]  sodium bicarbonate 650 MG tablet Take 650 mg by mouth 2 (two) times daily. 06/19/18   [provider]  valACYclovir (VALTREX) 1000 MG tablet Take 1 tablet (1,000 mg total) by mouth 3 (three) times daily. 10/16/18   Glean Hess, MD     Allergies:    Allergies  Allergen Reactions   Ace Inhibitors Other (See Comments)    Reaction:  Raises potassium    Quinapril Rash and Other (See Comments)    hyperkalemia    Social History:   Social History   Tobacco Use   Smoking status: Former Smoker    Packs/day: 1.50    Years: 44.00    Pack years: 66.00    Types: Cigarettes    Quit date: 06/24/1996    Years since quitting: 23.1   Smokeless tobacco: Never Used   Tobacco comment: smoking cessation materials not required  Substance Use Topics   Alcohol use: Yes    Alcohol/week: 0.0 standard drinks    Comment: rare   Drug use: No     Family History:   The patient's family history includes Cancer in his father; Diabetes in his mother; Heart disease in his father and mother.    ROS:  Please see the history of present illness. All other ROS reviewed and negative.     Physical Exam/Data:  There were no vitals filed for this visit. No intake or output data in the 24 hours ending 08/06/19 1825 Last 3 Weights 08/06/2019 08/06/2019 08/04/2019  Weight (lbs) 215 lb 6.2 oz 215 lb 6.2 oz 215 lb  Weight (kg) 97.7 kg 97.7 kg 97.523 kg     There is no height or weight on  file to calculate BMI.  General: Elderly man, lying comfortably in bed. HEENT: normal Lymph: no adenopathy Neck: Unable to assess JVP, as the patient is lying supine. Endocrine:  No thryomegaly Vascular: Right femoral artery sheath secured in place without hematoma. Cardiac: Regular rate and rhythm without murmurs, rubs, or gallops. Lungs: Lungs clear anteriorly. Abd: soft, nontender, no  hepatomegaly  Ext: no lower extremity edema Musculoskeletal:  No deformities, BUE and BLE strength normal and equal Skin: warm and dry  Neuro:  CNs 2-12 intact, no focal abnormalities noted Psych:  Normal affect    EKG:  The ECG that was done today at 8:24 AM was personally reviewed and demonstrates normal sinus rhythm with second-degree AV block (likely Mobitz type I) and nonspecific ST/T changes.  Relevant CV Studies: TTE (08/05/2019):  1. Left ventricular ejection fraction, by visual estimation, is 55 to 60%. The left ventricle has normal function. There is moderately increased left ventricular hypertrophy.  2. Global right ventricle has normal systolic function.The right ventricular size is normal. No increase in right ventricular wall thickness.  3. Left atrial size was normal.  4. Right atrial size was normal.  5. The mitral valve is normal in structure. Mild mitral valve regurgitation.  6. The tricuspid valve is normal in structure. Tricuspid valve regurgitation is trivial.  7. The aortic valve is bicuspid Aortic valve regurgitation is trivial by color flow Doppler. Mild aortic valve sclerosis without stenosis.  8. The pulmonic valve was normal in structure. Pulmonic valve regurgitation is trivial by color flow Doppler.  9. Normal pulmonary artery systolic pressure.  LHC/PCI (08/06/2019): 100% stenosis of right coronary artery ostium Distal left main coronary atherosclerosis of 75 to 80% to trifurcation of left anterior descending artery, circumflex artery, and ramus artery Patent mammary  graft to the left anterior descending artery Occluded saphenous vein graft to obtuse marginal 1 Patent but significant stenosis and thrombosed proximal graft to right coronary artery Successful PCI to SVG to RCA using resolute Onyx 4.0 x 38 mm drug-eluting stent with 10% residual stenosis.  Intervention complicated by focal perforation of the graft contained with internal balloon tamponade.  Laboratory Data:  High Sensitivity Troponin:   Recent Labs  Lab 08/04/19 2250 08/05/19 0043 08/05/19 0818 08/05/19 1624  TROPONINIHS 471* 507* 1,266* 1,123*      Chemistry Recent Labs  Lab 08/04/19 2250 08/05/19 0439  NA 136 138  K 5.2* 4.5  CL 107 111  CO2 21* 18*  GLUCOSE 336* 210*  BUN 60* 58*  CREATININE 5.91* 5.72*  CALCIUM 8.3* 8.2*  GFRNONAA 8* 9*  GFRAA 10* 10*  ANIONGAP 8 9    No results for input(s): PROT, ALBUMIN, AST, ALT, ALKPHOS, BILITOT in the last 168 hours. Hematology Recent Labs  Lab 08/05/19 0439 08/06/19 0636  WBC 8.1 8.6  RBC 3.63* 3.53*  HGB 11.6* 11.3*  HCT 35.6* 34.4*  MCV 98.1 97.5  MCH 32.0 32.0  MCHC 32.6 32.8  RDW 15.8* 15.5  PLT 121* 124*   BNPNo results for input(s): BNP, PROBNP in the last 168 hours.  DDimer No results for input(s): DDIMER in the last 168 hours.   Radiology/Studies:  No results found.  Assessment and Plan:   NSTEMI: Patient with severe native CAD and significant disease involving 2 out of 3 grafts.  Culprit most likely SVG to RCA now status post PCI complicated by focal perforation.  Patient transferred to [facility] for observation in the setting of graft perforation and repeat intervention if necessary.  Dual antiplatelet therapy with aspirin and ticagrelor.  Sheath removal tonight pending PTT and blood pressure control  Check CBC now for trend given recent bleeding  If patient becomes hemodynamically unstable or has significant drop in hemoglobin, repeat angiography of SVG to RCA may be needed with covered stent  placement.  Bedside echocardiogram showed no evidence of  pericardial effusion.  Plan to repeat limited echo tomorrow to exclude developing effusion.  Aggressive secondary prevention, including high intensity statin therapy.  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  Patient was previously on atenolol, which has been held.  The patient was previously on atenolol, which has been held.  Continue to hold atenolol and all AV-nodal blocking agents.  No indication for temporary or permanent pacemaker at this time.  Hypertension: Blood pressure poorly controlled during and after catheterization.  Blood pressure control will be important to reduce risk for rebleeding from SVG perforation.  Continue nitro drips and infusion, to be titrated for target SBP less than 160 mmHg.  Continue hydralazine 50 mg every 8 hours.  Acute on chronic kidney failure: Worsening creatinine noted on admission.  Patient seen in [place] by nephrology with anticipation for initiation of hemodialysis during this hospitalization given NSTEMI and IV contrast.  Peritoneal dialysis had previously been discussed with the patient with plans for PD catheter placement (this has not yet happened).  Gentle post catheterization hydration.  We will need to be careful that Mr. Schnick does not become volume overloaded in the setting of his advanced kidney disease.  Avoid nephrotoxic drugs.  We will need to consult nephrology at [facility] to evaluate need for hemodialysis during this admission.  Type 2 diabetes mellitus:  Continue sliding scale insulin.  Code status: Immediately following catheterization, both Mr. Forand and his family stated that he would want to undergo all necessary life-sustaining/ACLS measures unless there is no reasonable chance for meaningful recovery.  He will remain Full Code status.  If his clinical condition deteriorates, this will  need to be readdressed with the patient and his family.  Severity of Illness: The appropriate patient status for this patient is INPATIENT. Inpatient status is judged to be reasonable and necessary in order to provide the required intensity of service to ensure the patient's safety. The patient's presenting symptoms, physical exam findings, and initial radiographic and laboratory data in the context of their chronic comorbidities is felt to place them at high risk for further clinical deterioration. Furthermore, it is not anticipated that the patient will be medically stable for discharge from the hospital within 2 midnights of admission. The following factors support the patient status of inpatient.   " The patient's presenting symptoms include severe chest pain. " The initial radiographic and laboratory data are worrisome because of elevated troponin and cardiac catheterization with multivessel coronary artery disease complicated by focal SVG perforation during PCI today. " The chronic co-morbidities include chronic kidney disease, diabetes mellitus, and hypertension.  * I certify that at the point of admission it is my clinical judgment that the patient will require inpatient hospital care spanning beyond 2 midnights from the point of admission due to high intensity of service, high risk for further deterioration and high frequency of surveillance required.*    For questions or updates, please contact [place] Please consult [email or web address] for contact info under        Signed, Nelva Bush, MD  08/06/2019 6:25 PM   Marcie Mowers, MD Cardiology Fellow, PGY-7

## 2019-08-06 NOTE — Progress Notes (Signed)
Pt c/o 5/10 chest pain/ tightness/ / no distress, sob, or sweating noted/ VSS/ no changes on tele/ nitro given / 2L [place]/ Dr. Nehemiah Massed on floor and  made aware/ will continue to monitor

## 2019-08-06 NOTE — Progress Notes (Signed)
Received from Cath Lab. Throwing up. Had just received Zofran before transfer back to ICU. Continues to have nausea and vomitting. Dr. Saunders Revel called. Given Phenergan 6.25 mg IVP as well as Morphine for a headache. Dr. Juleen China called about Sodium Bicarb.infusion. Restarted at 50 mls/hour. Patient states he is hallucinating. Nausea much better now. In and out of confusion but know self and family. Very defensive to RT when his post procedure EKG done. Dr. Saunders Revel up to see patient and assess patient.  Stat Chest Xray ordered.

## 2019-08-06 NOTE — Consult Note (Signed)
[facility]  CONSULT NOTE    Date: 08/06/2019                  Patient Name:  [name]  MRN: [id number]  DOB: Oct 01, 1941  Age / Sex: 78 y.o., male         PCP: Reubin Milan, MD                 Service Requesting Consult: Dr. Elpidio Anis                 Reason for Consult: Acute renal failure            History of Present Illness: [name] is a 78 y.o. white male admitted to acute coronary syndrome. Wife at bedside.  Patient states he knows he eventually will need to do dialysis. He understands IV dye can cause him to have kidney failure.   He is requesting to be started on peritoneal dialysis if possible. Follows with [facility] and wants to go to [facility] in [place].    Medications: Outpatient medications: Medications Prior to Admission  Medication Sig Dispense Refill Last Dose  . acetaminophen (TYLENOL) 500 MG tablet Take 1,000 mg every 6 (six) hours as needed by mouth for moderate pain or headache.   prn at prn  . atenolol (TENORMIN) 50 MG tablet TAKE 1 TABLET BY MOUTH ONCE DAILY (Patient taking differently: Take 50 mg by mouth daily. ) 90 tablet 3 08/04/2019 at 0800  . atorvastatin (LIPITOR) 40 MG tablet TAKE 1 TABLET BY MOUTH ONCE DAILY AT  6PM (Patient taking differently: Take 40 mg by mouth daily at 6 PM. ) 90 tablet 0 Past Week at Unknown time  . calcitRIOL (ROCALTROL) 0.5 MCG capsule Take 0.5 mcg by mouth daily.   08/04/2019 at Unknown time  . clopidogrel (PLAVIX) 75 MG tablet Take 1 tablet by mouth once daily (Patient taking differently: Take 75 mg by mouth daily. ) 90 tablet 0 08/04/2019 at Unknown time  . EQ ASPIRIN ADULT LOW DOSE 81 MG EC tablet TAKE 1  BY MOUTH ONCE DAILY (Patient taking differently: Take 81 mg by mouth daily. ) 90 tablet 0 08/04/2019 at Unknown time  . insulin NPH Human (HUMULIN N,NOVOLIN N) 100 UNIT/ML injection Inject 0.25 mLs (25 Units total) into the skin 2 (two) times daily. (Patient taking  differently: Inject 30 Units into the skin 2 (two) times daily. ) 10 mL 11 08/04/2019 at Unknown time  . insulin regular (NOVOLIN R,HUMULIN R) 100 units/mL injection Inject 12 Units into the skin 2 (two) times daily.    08/04/2019 at Unknown time  . levothyroxine (SYNTHROID, LEVOTHROID) 300 MCG tablet Take 300 mcg by mouth daily before breakfast.    08/04/2019 at Unknown time  . sodium bicarbonate 650 MG tablet Take 650 mg by mouth 2 (two) times daily.   08/04/2019 at Unknown time  . valACYclovir (VALTREX) 1000 MG tablet Take 1 tablet (1,000 mg total) by mouth 3 (three) times daily. 21 tablet 0 08/04/2019 at Unknown time  . Blood Glucose Monitoring Suppl (FIFTY50 GLUCOSE METER 2.0) w/Device KIT 1 Device by Other route 2 (two) times daily.     Marland Kitchen glucose blood (RELION PRIME TEST) test strip 1 strip by Other route 2 (two) times daily.       Current medications: Current Facility-Administered Medications  Medication Dose Route Frequency Provider Last Rate Last Dose  . [MAR Hold] 0.9 %  sodium chloride infusion  Intravenous PRN Erin Fulling, MD 10 mL/hr at 08/06/19 1200    . [MAR Hold] acetaminophen (TYLENOL) tablet 1,000 mg  1,000 mg Oral Q6H PRN Jimmye Norman, NP      . Mitzi Hansen Hold] aspirin EC tablet 81 mg  81 mg Oral Daily Jimmye Norman, NP   81 mg at 08/06/19 0943  . [MAR Hold] atorvastatin (LIPITOR) tablet 80 mg  80 mg Oral q1800 Jimmye Norman, NP   80 mg at 08/05/19 1739  . [MAR Hold] atropine 1 MG/10ML injection 0.5 mg  0.5 mg Intravenous PRN Mansy, Jan A, MD      . bivalirudin (ANGIOMAX) 250 mg in sodium chloride 0.9 % 50 mL (5 mg/mL) infusion    Continuous PRN End, Christopher, MD 19.5 mL/hr at 08/06/19 1358 1 mg/kg/hr at 08/06/19 1358  . bivalirudin (ANGIOMAX) BOLUS via infusion    PRN End, Cristal Deer, MD   73.275 mg at 08/06/19 1357  . [MAR Hold] calcitRIOL (ROCALTROL) capsule 0.5 mcg  0.5 mcg Oral Daily Jimmye Norman, NP   0.5 mcg at 08/06/19 0933  .  [MAR Hold] Chlorhexidine Gluconate Cloth 2 % PADS 6 each  6 each Topical Daily Erin Fulling, MD   6 each at 08/06/19 1122  . [MAR Hold] clopidogrel (PLAVIX) tablet 75 mg  75 mg Oral Daily Jimmye Norman, NP   75 mg at 08/06/19 0932  . fentaNYL (SUBLIMAZE) injection    PRN Lamar Blinks, MD   25 mcg at 08/06/19 1308  . fentaNYL (SUBLIMAZE) injection    PRN End, Cristal Deer, MD   25 mcg at 08/06/19 1351  . heparin ADULT infusion 100 units/mL (25000 units/244mL sodium chloride 0.45%)  1,350 Units/hr Intravenous Continuous Nita Sickle, MD   Stopped at 08/06/19 1240  . [MAR Hold] hydrALAZINE (APRESOLINE) tablet 50 mg  50 mg Oral Q8H Sudini, Wardell Heath, MD   50 mg at 08/06/19 0647  . [MAR Hold] insulin aspart (novoLOG) injection 0-5 Units  0-5 Units Subcutaneous QHS Jimmye Norman, NP   2 Units at 08/05/19 2207  . [MAR Hold] insulin aspart (novoLOG) injection 0-9 Units  0-9 Units Subcutaneous TID WC Jimmye Norman, NP   3 Units at 08/06/19 1128  . [MAR Hold] levothyroxine (SYNTHROID) tablet 300 mcg  300 mcg Oral QAC breakfast Jimmye Norman, NP   300 mcg at 08/06/19 0932  . midazolam (VERSED) injection    PRN Lamar Blinks, MD   1 mg at 08/06/19 1308  . midazolam (VERSED) injection    PRN End, Cristal Deer, MD   1 mg at 08/06/19 1351  . [MAR Hold] morphine 2 MG/ML injection 2 mg  2 mg Intravenous Q4H PRN Milagros Loll, MD   2 mg at 08/06/19 0944  . morphine 2 MG/ML injection           . nitroGLYCERIN (NITROSTAT) 0.4 MG SL tablet           . [MAR Hold] nitroGLYCERIN (NITROSTAT) SL tablet 0.4 mg  0.4 mg Sublingual Q5 min PRN Lamar Blinks, MD   0.4 mg at 08/06/19 0803  . [MAR Hold] nitroGLYCERIN 50 mg in dextrose 5 % 250 mL (0.2 mg/mL) infusion  0-200 mcg/min Intravenous Titrated Sudini, Srikar, MD 6 mL/hr at 08/06/19 1200 20 mcg/min at 08/06/19 1200  . [MAR Hold] ondansetron (ZOFRAN) injection 4 mg  4 mg Intravenous Q6H PRN Jimmye Norman, NP       . sodium bicarbonate 150 mEq in dextrose  5% 1000 mL infusion  150 mEq Intravenous Continuous Davien Malone, MD 50 mL/hr at 08/06/19 1200    . [MAR Hold] sodium bicarbonate tablet 650 mg  650 mg Oral BID Jimmye Norman, NP   650 mg at 08/06/19 0932  . [MAR Hold] sodium chloride flush (NS) 0.9 % injection 3 mL  3 mL Intravenous Q12H Lamar Blinks, MD      . ticagrelor Marden Noble) tablet    PRN End, Cristal Deer, MD   180 mg at 08/06/19 1331  . [MAR Hold] valACYclovir (VALTREX) tablet 1,000 mg  1,000 mg Oral TID Jimmye Norman, NP   1,000 mg at 08/06/19 1610      Allergies: Allergies  Allergen Reactions  . Ace Inhibitors Other (See Comments)    Reaction:  Raises potassium   . Quinapril Rash and Other (See Comments)    hyperkalemia      Past Medical History: Past Medical History:  Diagnosis Date  . Blood transfusion without reported diagnosis    patient unaware of receiving blood unless it was during surgery and he was not told  . Cancer [facility]) 2001   Colon resection  . CAP (community acquired pneumonia) 08/11/2017  . Carotid stenosis, symptomatic, with infarction (HCC) 07/17/2017  . Chronic kidney disease 11/2017   stage IV CKD per nephrologist.  . Complication of anesthesia    raspy voice since carotid endarterectomy 07/17/17. paralysis of vocal chords  . Coronary artery disease   . Diabetes mellitus without complication (HCC)   . Dysrhythmia    patient unaware of any irregular heart rhythms  . Hyperlipidemia   . Hypertension   . Hypothyroidism   . Kidney mass 2019  . Myocardial infarction (HCC) 1997  . Peripheral vascular disease (HCC)   . Pneumonia 2014, 2018   developed after surgery 2018  . Stroke (HCC) 07/2017   mild stroke and then had carotid endarterectomy  . Thyroid disease   . Wears dentures    full upper  . Weight loss 2019   patient has lost over 60 pounds since 07/2017.      Past Surgical History: Past Surgical History:  Procedure  Laterality Date  . APPENDECTOMY  2011  . BACK SURGERY    . CARDIAC CATHETERIZATION  1997  . CAROTID ANGIOGRAPHY Right 06/13/2017   Procedure: Right subclavian and Carotid Angiography, possible intervention;  Surgeon: Annice Needy, MD;  Location: [facility] INVASIVE CV LAB;  Service: Cardiovascular;  Laterality: Right;  . CATARACT EXTRACTION, BILATERAL    . CHOLECYSTECTOMY N/A 04/23/2016   had infection post surgery requiring him to debride daily  . 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 COLONOSCOPY WITH PROPOFOL N/A 10/26/2018   Procedure: COLONOSCOPY WITH PROPOFOL;  Surgeon: Midge Minium, MD;  Location: [facility];  Service: Endoscopy;  Laterality: N/A;  Diabetic - insulin  . CORONARY ARTERY BYPASS GRAFT  1997   x 3  . CYSTOSCOPY W/ RETROGRADES Bilateral 09/03/2017   Procedure: CYSTOSCOPY WITH RETROGRADE PYELOGRAM;  Surgeon: Vanna Scotland, MD;  Location: [facility] ORS;  Service: Urology;  Laterality: Bilateral;  . CYSTOSCOPY W/ RETROGRADES Right 01/28/2018   Procedure: CYSTOSCOPY WITH RETROGRADE PYELOGRAM;  Surgeon: Vanna Scotland, MD;  Location: [facility] ORS;  Service: Urology;  Laterality: Right;  . CYSTOSCOPY W/ URETERAL STENT REMOVAL  08/2017  . CYSTOSCOPY WITH BIOPSY Right 01/28/2018   Procedure: CYSTOSCOPY WITH BIOPSY;  Surgeon: Vanna Scotland, MD;  Location: [facility] OR[facility];  Service: Urology;  Laterality: Right;  . CYSTOSCOPY WITH STENT PLACEMENT Right 09/03/2017   Procedure: CYSTOSCOPY WITH STENT PLACEMENT;  Surgeon: Vanna Scotland, MD;  Location: [facility];  Service: Urology;  Laterality: Right;  . CYSTOSCOPY WITH STENT PLACEMENT Right 01/28/2018   Procedure: CYSTOSCOPY WITH STENT PLACEMENT;  Surgeon: Vanna Scotland, MD;  Location: [facility];  Service: Urology;  Laterality: Right;  . CYSTOSCOPY WITH URETEROSCOPY Right 01/28/2018   Procedure: CYSTOSCOPY WITH URETEROSCOPY;  Surgeon: Vanna Scotland, MD;  Location: [facility];  Service: Urology;   Laterality: Right;  . ENDARTERECTOMY Right 07/17/2017   Procedure: ENDARTERECTOMY CAROTID;  Surgeon: Annice Needy, MD;  Location: [facility] OR[facility];  Service: Vascular;  Laterality: Right;  . HERNIA REPAIR  2011   Ventral hernia  . HOLMIUM LASER APPLICATION N/A 09/03/2017   Procedure: HOLMIUM LASER APPLICATION;  Surgeon: Vanna Scotland, MD;  Location: [facility];  Service: Urology;  Laterality: N/A;  . KNEE SURGERY Left    arthroscopy  . KYPHOPLASTY N/A 08/14/2017   Procedure: [id number];  Surgeon: Kennedy Bucker, MD;  Location: [facility];  Service: Orthopedics;  Laterality: N/A;  . LARYNX SURGERY    . POLYPECTOMY  10/26/2018   Procedure: POLYPECTOMY INTESTINAL;  Surgeon: Midge Minium, MD;  Location: [facility];  Service: Endoscopy;;  Descending colon polyp Transverse colon polyps x 3  . PROSTATE SURGERY  2002   BPH benign pathology  . SPINE SURGERY  1989   Lumbar disc  . THYROPLASTY Right 01/05/2018   Procedure: THYROPLASTY;  Surgeon: Linus Salmons, MD;  Location: [facility] OR[facility];  Service: ENT;  Laterality: Right;     Family History: Family History  Problem Relation Age of Onset  . Diabetes Mother   . Heart disease Mother   . Heart disease Father   . Cancer Father        lung     Social History: Social History   Socioeconomic History  . Marital status: Married    Spouse name: Not on file  . Number of children: 2  . Years of education: some college  . Highest education level: 12th grade  Occupational History  . Occupation: retired  Engineer, production  . Financial resource strain: Not hard at all  . Food insecurity    Worry: Never true    Inability: Never true  . Transportation needs    Medical: No    Non-medical: No  Tobacco Use  . Smoking status: Former Smoker    Packs/day: 1.50    Years: 44.00    Pack years: 66.00    Types: Cigarettes    Quit date: 06/24/1996    Years since quitting: 23.1  . Smokeless tobacco: Never Used  . Tobacco comment: smoking cessation  materials not required  Substance and Sexual Activity  . Alcohol use: Yes    Alcohol/week: 0.0 standard drinks    Comment: rare  . Drug use: No  . Sexual activity: Not Currently  Lifestyle  . Physical activity    Days per week: 0 days    Minutes per session: 0 min  . Stress: Only a little  Relationships  . Social Musician on phone: Patient refused    Gets together: Patient refused    Attends religious service: Patient refused    Active member of club or organization: Patient refused    Attends meetings of clubs or organizations: Patient refused    Relationship status: Married  . Intimate partner violence    Fear of current or ex partner: No  Emotionally abused: No    Physically abused: No    Forced sexual activity: No  Other Topics Concern  . Not on file  Social History Narrative  . Not on file     Review of Systems: Review of Systems  Constitutional: Negative.  Negative for chills, diaphoresis, fever, malaise/fatigue and weight loss.  HENT: Negative.  Negative for congestion, ear discharge, ear pain, hearing loss, nosebleeds, sinus pain, sore throat and tinnitus.   Eyes: Negative.  Negative for blurred vision, double vision, photophobia, pain, discharge and redness.  Respiratory: Negative.  Negative for cough, hemoptysis, sputum production, shortness of breath, wheezing and stridor.   Cardiovascular: Positive for chest pain and palpitations. Negative for orthopnea, claudication, leg swelling and PND.  Gastrointestinal: Positive for heartburn. Negative for abdominal pain, blood in stool, constipation, diarrhea, melena, nausea and vomiting.  Genitourinary: Negative.  Negative for dysuria, flank pain, frequency, hematuria and urgency.  Musculoskeletal: Negative.  Negative for back pain, falls, joint pain, myalgias and neck pain.  Skin: Negative.  Negative for itching and rash.  Neurological: Negative.  Negative for dizziness, tingling, tremors, sensory change,  speech change, focal weakness, seizures, loss of consciousness, weakness and headaches.  Endo/Heme/Allergies: Negative.  Negative for environmental allergies and polydipsia. Does not bruise/bleed easily.  Psychiatric/Behavioral: Negative.  Negative for depression, hallucinations, memory loss, substance abuse and suicidal ideas. The patient is not nervous/anxious and does not have insomnia.     Vital Signs: Blood pressure (!) 152/109, pulse 71, temperature 97.7 F (36.5 C), temperature source Oral, resp. rate 20, height 6\' 2"  (1.88 m), weight 97.7 kg, SpO2 98 %.  Weight trends: Filed Weights   08/04/19 2251 08/06/19 1121 08/06/19 1230  Weight: 97.5 kg 97.7 kg 97.7 kg    Physical Exam: General: NAD,   Head: Normocephalic, atraumatic. Moist oral mucosal membranes  Eyes: Anicteric, PERRL  Neck: Supple, trachea midline  Lungs:  Clear to auscultation  Heart: Regular rate and rhythm  Abdomen:  Soft, nontender,   Extremities:  no peripheral edema.  Neurologic: Nonfocal, moving all four extremities  Skin: No lesions  Access: none     Lab results: Basic Metabolic Panel: Recent Labs  Lab 08/04/19 2250 08/05/19 0439  NA 136 138  K 5.2* 4.5  CL 107 111  CO2 21* 18*  GLUCOSE 336* 210*  BUN 60* 58*  CREATININE 5.91* 5.72*  CALCIUM 8.3* 8.2*    Liver Function Tests: No results for input(s): AST, ALT, ALKPHOS, BILITOT, PROT, ALBUMIN in the last 168 hours. No results for input(s): LIPASE, AMYLASE in the last 168 hours. No results for input(s): AMMONIA in the last 168 hours.  CBC: Recent Labs  Lab 08/04/19 2250 08/05/19 0439 08/06/19 0636  WBC 8.6 8.1 8.6  HGB 11.7* 11.6* 11.3*  HCT 36.0* 35.6* 34.4*  MCV 98.4 98.1 97.5  PLT 129* 121* 124*    Cardiac Enzymes: No results for input(s): CKTOTAL, CKMB, CKMBINDEX, TROPONINI in the last 168 hours.  BNP: Invalid input(s): POCBNP  CBG: Recent Labs  Lab 08/05/19 1150 08/05/19 1715 08/05/19 2128 08/06/19 0747  08/06/19 1116  GLUCAP 163* 216* 213* 186* 224*    Microbiology: Results for orders placed or performed during the hospital encounter of 08/04/19  MRSA PCR Screening     Status: None   Collection Time: 08/04/19 10:47 PM   Specimen: Nasal Mucosa; Nasopharyngeal  Result Value Ref Range Status   MRSA by PCR NEGATIVE NEGATIVE Final    Comment:  The GeneXpert MRSA Assay (FDA approved for NASAL specimens only), is one component of a comprehensive MRSA colonization surveillance program. It is not intended to diagnose MRSA infection nor to guide or monitor treatment for MRSA infections. Performed at [facility], [address] [place], [place] [address]   SARS Coronavirus 2 by RT PCR (hospital order, performed in [facility] hospital lab) Nasopharyngeal Nasopharyngeal Swab     Status: None   Collection Time: 08/05/19 12:19 AM   Specimen: Nasopharyngeal Swab  Result Value Ref Range Status   SARS Coronavirus 2 NEGATIVE NEGATIVE Final    Comment: (NOTE) If result is NEGATIVE SARS-CoV-2 target nucleic acids are NOT DETECTED. The SARS-CoV-2 RNA is generally detectable in upper and lower  respiratory specimens during the acute phase of infection. The lowest  concentration of SARS-CoV-2 viral copies this assay can detect is 250  copies / mL. A negative result does not preclude SARS-CoV-2 infection  and should not be used as the sole basis for treatment or other  patient management decisions.  A negative result may occur with  improper specimen collection / handling, submission of specimen other  than nasopharyngeal swab, presence of viral mutation(s) within the  areas targeted by this assay, and inadequate number of viral copies  (<250 copies / mL). A negative result must be combined with clinical  observations, patient history, and epidemiological information. If result is POSITIVE SARS-CoV-2 target nucleic acids are DETECTED. The SARS-CoV-2 RNA is generally detectable  in upper and lower  respiratory specimens dur ing the acute phase of infection.  Positive  results are indicative of active infection with SARS-CoV-2.  Clinical  correlation with patient history and other diagnostic information is  necessary to determine patient infection status.  Positive results do  not rule out bacterial infection or co-infection with other viruses. If result is PRESUMPTIVE POSTIVE SARS-CoV-2 nucleic acids MAY BE PRESENT.   A presumptive positive result was obtained on the submitted specimen  and confirmed on repeat testing.  While 2019 novel coronavirus  (SARS-CoV-2) nucleic acids may be present in the submitted sample  additional confirmatory testing may be necessary for epidemiological  and / or clinical management purposes  to differentiate between  SARS-CoV-2 and other Sarbecovirus currently known to infect humans.  If clinically indicated additional testing with an alternate test  methodology [phone]) is advised. The SARS-CoV-2 RNA is generally  detectable in upper and lower respiratory sp ecimens during the acute  phase of infection. The expected result is Negative. Fact Sheet for Patients:  BoilerBrush.com.cy Fact Sheet for Healthcare Providers: https://pope.com/ This test is not yet approved or cleared by the [place] FDA and has been authorized for detection and/or diagnosis of SARS-CoV-2 by FDA under an Emergency Use Authorization (EUA).  This EUA will remain in effect (meaning this test can be used) for the duration of the COVID-19 declaration under Section 564(b)(1) of the Act, 21 U.S.C. section 360bbb-3(b)(1), unless the authorization is terminated or revoked sooner. Performed at [facility], [address] [place], [place] [address]     Coagulation Studies: Recent Labs    08/04/19 2250  LABPROT 13.8  INR 1.1    Urinalysis: No results for input(s): COLORURINE, LABSPEC,  PHURINE, GLUCOSEU, HGBUR, BILIRUBINUR, KETONESUR, PROTEINUR, UROBILINOGEN, NITRITE, LEUKOCYTESUR in the last 72 hours.  Invalid input(s): APPERANCEUR    Imaging: Dg Chest Port 1 View  Result Date: 08/04/2019 CLINICAL DATA:  Chest pain EXAM: PORTABLE CHEST 1 VIEW COMPARISON:  08/10/2017 FINDINGS: Post sternotomy  changes. Chronic elevation of right diaphragm. No acute airspace disease or effusion. Stable cardiomediastinal silhouette with aortic atherosclerosis. No pneumothorax. IMPRESSION: No active disease.  Stable chronic elevation of right diaphragm Electronically Signed   By: Jasmine Pang M.D.   On: 08/04/2019 23:28      Assessment & Plan: Mr. JEANETTE DIFRANCO is a 78 y.o. white male with coronary artery disease status post CABG, hypertension, diabetes mellitus type II, hypothyroidism, CVA, carotid artery stenosis, peripheral vascular disease,  who was admitted to [facility] on 08/04/2019 for NSTEMI (non-ST elevated myocardial infarction) (HCC) [I21.4] Chest pain [R07.9]  1. Acute renal failure on chronic kidney disease stage IV with proteinuria and metabolic acidosis: baseline creatinine of 3.64, GFR of 15 on 12/14/18.  Followed by [facility] Chronic kidney disease secondary to diabetic nephropathy Acute renal injury due to ischemic event  - Continue IV fluids - Recommend minimal use of IV dye - If patient requires dialysis, he prefers doing an urgent start of peritoneal dialysis   2. Anemia of chronic kidney disease: hemoglobin at goal, 11.3  3. Hypertension: elevated during encounter. Home regimen of atenolol.   4. Diabetes mellitus type II with chronic kidney disease:  5. Coronary artery disease: with acute coronary syndrome Left cardiac catheterization scheduled for later today.   LOS: 1 Karyl Sharrar 10/23/20202:59 PM

## 2019-08-06 NOTE — Progress Notes (Signed)
NSTEMI Admitted to telemetry floor and started on heparin drip. ASA, statin, BB He had nitropaste and nitro SL. Continued to have chest pain and was taken to Cardiac cath. He had complication of perforation of vein during intervention and will be transferred to [facility] for further monitoring and care.  Intermittent heart block Asymptomatic. Appreciate cardiolgy  input  Atenolol  stopped AKI superimposed on CKD4 -Hold nephrotoxins -IV fluids hydration -Continue to monitor renal function High risk for worsening with cardiac cath. May need HD  To be transferred to [place], M.D.  [facility]  Medical Director [place] Director [facility] Cardio-Pulmonary Department

## 2019-08-06 NOTE — Progress Notes (Signed)
[employer] - [place] at [facility]   PATIENT NAME: [name]    MR#:  [id number]  DATE OF BIRTH:  04-18-1941  SUBJECTIVE:  CHIEF COMPLAINT:   Chief Complaint  Patient presents with  . Chest Pain   Still having chest pain.  Tried Sl Nitro and Nitro patch but still having pain  REVIEW OF SYSTEMS:    Review of Systems  Constitutional: Negative for chills and fever.  HENT: Negative for sore throat.   Eyes: Negative for blurred vision, double vision and pain.  Respiratory: Negative for cough, hemoptysis, shortness of breath and wheezing.   Cardiovascular: Positive for chest pain. Negative for palpitations, orthopnea and leg swelling.  Gastrointestinal: Negative for abdominal pain, constipation, diarrhea, heartburn, nausea and vomiting.  Genitourinary: Negative for dysuria and hematuria.  Musculoskeletal: Negative for back pain and joint pain.  Skin: Negative for rash.  Neurological: Negative for sensory change, speech change, focal weakness and headaches.  Endo/Heme/Allergies: Does not bruise/bleed easily.  Psychiatric/Behavioral: Negative for depression. The patient is not nervous/anxious.     DRUG ALLERGIES:   Allergies  Allergen Reactions  . Ace Inhibitors Other (See Comments)    Reaction:  Raises potassium   . Quinapril Rash and Other (See Comments)    hyperkalemia    VITALS:  Blood pressure 122/67, pulse 71, temperature 97.6 F (36.4 C), temperature source Oral, resp. rate 19, height 6\' 2"  (1.88 m), weight 97.5 kg, SpO2 99 %.  PHYSICAL EXAMINATION:   Physical Exam  GENERAL:  78 y.o.-year-old patient lying in the bed with distress due to pain EYES: Pupils equal, round, reactive to light and accommodation. No scleral icterus. Extraocular muscles intact.  HEENT: Head atraumatic, normocephalic. Oropharynx and nasopharynx clear.  NECK:  Supple, no jugular venous distention. No thyroid enlargement, no tenderness.  LUNGS: Normal breath sounds  bilaterally, no wheezing, rales, rhonchi. No use of accessory muscles of respiration.  CARDIOVASCULAR: S1, S2 normal. No murmurs, rubs, or gallops.  ABDOMEN: Soft, nontender, nondistended. Bowel sounds present. No organomegaly or mass.  EXTREMITIES: No cyanosis, clubbing or edema b/l.    NEUROLOGIC: Cranial nerves II through XII are intact. No focal Motor or sensory deficits b/l.   PSYCHIATRIC: The patient is alert and oriented x 3.  SKIN: No obvious rash, lesion, or ulcer.   LABORATORY PANEL:   CBC Recent Labs  Lab 08/06/19 0636  WBC 8.6  HGB 11.3*  HCT 34.4*  PLT 124*   ------------------------------------------------------------------------------------------------------------------ Chemistries  Recent Labs  Lab 08/05/19 0439  NA 138  K 4.5  CL 111  CO2 18*  GLUCOSE 210*  BUN 58*  CREATININE 5.72*  CALCIUM 8.2*   ------------------------------------------------------------------------------------------------------------------  Cardiac Enzymes No results for input(s): TROPONINI in the last 168 hours. ------------------------------------------------------------------------------------------------------------------  RADIOLOGY:  Dg Chest Port 1 View  Result Date: 08/04/2019 CLINICAL DATA:  Chest pain EXAM: PORTABLE CHEST 1 VIEW COMPARISON:  08/10/2017 FINDINGS: Post sternotomy changes. Chronic elevation of right diaphragm. No acute airspace disease or effusion. Stable cardiomediastinal silhouette with aortic atherosclerosis. No pneumothorax. IMPRESSION: No active disease.  Stable chronic elevation of right diaphragm Electronically Signed   By: [name] M.D.   On: 08/04/2019 23:28     ASSESSMENT AND PLAN:   78 y.o. male with pertinent past medical history of CAD s/p CABG (1997), stage IV CKD, bilateral carotid stenosis, PVD, MI, CVA, hypertension, hyperlipidemia, hypothyroidism, and kidney mass presenting to the ED with chief complaints of chest pain.  * NSTEMI   ASA, Heparin, Statin  Echo . No wall motion abnormalities Continues to have chest pain Tried nitro sublingual tablets, Nitropaste.  IV morphine given.  No improvement.  Will start nitro drip and transferred to stepdown unit.  Critically ill.  Discussed with patient and wife at bedside. Discussed with Dr. Gwen Ruiz of cardiology was planning on cardiac catheterization.  * Intermittent heart block     Asymptomatic. Appreciate cardiolgy input                   * AKI superimposed on CKD4 - Hold nephrotoxins - IV fluids hydration - Continue to monitor renal function _ Discussed with nephrology Dr. Wynelle Ruiz High risk for worsening with cardiac cath. May need HD   * HLD  + Goal LDL<100 - Atorvastatin 80mg  PO qhs - Check lipid panel  * HTN  + Goal BP <130/80 - Continue atenolol  * Diabetes mellitus - SSI  * Hypothyroidism - Continue Synthroid - Check TSH as above  All the records are reviewed and case discussed with Care Management/Social Worker Management plans discussed with the patient, family and they are in agreement.  CODE STATUS: DNR  DVT Prophylaxis: Heparin drip  TOTAL CRITICAL CARE TIME TAKING CARE OF THIS PATIENT: 80 minutes.   POSSIBLE D/C IN 2-3 DAYS, DEPENDING ON CLINICAL CONDITION.  Molinda Bailiff [name] M.D on 08/06/2019 at 11:17 AM  Between 7am to 6pm - Pager - [phone]  After 6pm go to [email or web address] - password [email or web address] [facility]  [facility] [place] [facility]  Office  [phone]  CC: Primary care physician; James Milan, MD  Note: This dictation was prepared with Dragon dictation along with smaller phrase technology. Any transcriptional errors that result from this process are unintentional.

## 2019-08-06 NOTE — Progress Notes (Signed)
Pt continues to have chest pain/ no sob, sweating, or distress noted/ no changes on tele / VSS/ repeat EKG obtained/ morphine given / nitro paste/ MD and Hilbert Odor, PA for cardio have been made aware/ PA at bedside/ will continue to assess

## 2019-08-06 NOTE — Plan of Care (Signed)
  Problem: Education: Goal: Knowledge of General Education information will improve Description: Including pain rating scale, medication(s)/side effects and non-pharmacologic comfort measures Outcome: Progressing   Problem: Education: Goal: Understanding of cardiac disease, CV risk reduction, and recovery process will improve Outcome: Progressing Goal: Understanding of medication regimen will improve Outcome: Progressing Goal: Individualized Educational Video(s) Outcome: Progressing   Problem: Activity: Goal: Ability to tolerate increased activity will improve Outcome: Progressing   Problem: Cardiac: Goal: Ability to achieve and maintain adequate cardiopulmonary perfusion will improve Outcome: Progressing Goal: Vascular access site(s) Level 0-1 will be maintained Outcome: Progressing   Problem: Health Behavior/Discharge Planning: Goal: Ability to safely manage health-related needs after discharge will improve Outcome: Progressing

## 2019-08-06 NOTE — Progress Notes (Addendum)
Shift summary:  - Patient transferred from 2A with unstable angina.  - Transported to cath lab at approx 1215 hrs.  - Report given to Candie Chroman, RN. Patient remains off unit for now.

## 2019-08-06 NOTE — Progress Notes (Signed)
   08/06/19 1100  Clinical Encounter Type  Visited With Patient and family together  Visit Type Initial  Referral From Nurse  Spiritual Encounters  Spiritual Needs Prayer;Emotional  Ch received a page for prayer request. Pt was still experiencing pain while the wife kept presence at bedside. Ch prayed for the patient. The visit was appreciated.

## 2019-08-07 ENCOUNTER — Encounter (HOSPITAL_COMMUNITY): Payer: Self-pay

## 2019-08-07 ENCOUNTER — Other Ambulatory Visit: Payer: Self-pay

## 2019-08-07 ENCOUNTER — Inpatient Hospital Stay (HOSPITAL_COMMUNITY): Payer: Medicare HMO

## 2019-08-07 DIAGNOSIS — E785 Hyperlipidemia, unspecified: Secondary | ICD-10-CM

## 2019-08-07 DIAGNOSIS — Z794 Long term (current) use of insulin: Secondary | ICD-10-CM

## 2019-08-07 DIAGNOSIS — E1122 Type 2 diabetes mellitus with diabetic chronic kidney disease: Secondary | ICD-10-CM

## 2019-08-07 DIAGNOSIS — E1165 Type 2 diabetes mellitus with hyperglycemia: Secondary | ICD-10-CM

## 2019-08-07 DIAGNOSIS — I351 Nonrheumatic aortic (valve) insufficiency: Secondary | ICD-10-CM

## 2019-08-07 DIAGNOSIS — I361 Nonrheumatic tricuspid (valve) insufficiency: Secondary | ICD-10-CM

## 2019-08-07 DIAGNOSIS — N184 Chronic kidney disease, stage 4 (severe): Secondary | ICD-10-CM

## 2019-08-07 DIAGNOSIS — I129 Hypertensive chronic kidney disease with stage 1 through stage 4 chronic kidney disease, or unspecified chronic kidney disease: Secondary | ICD-10-CM

## 2019-08-07 DIAGNOSIS — I214 Non-ST elevation (NSTEMI) myocardial infarction: Secondary | ICD-10-CM | POA: Diagnosis present

## 2019-08-07 DIAGNOSIS — E1169 Type 2 diabetes mellitus with other specified complication: Secondary | ICD-10-CM

## 2019-08-07 LAB — BASIC METABOLIC PANEL
Anion gap: 12 (ref 5–15)
BUN: 53 mg/dL — ABNORMAL HIGH (ref 8–23)
CO2: 16 mmol/L — ABNORMAL LOW (ref 22–32)
Calcium: 8.2 mg/dL — ABNORMAL LOW (ref 8.9–10.3)
Chloride: 108 mmol/L (ref 98–111)
Creatinine, Ser: 5.49 mg/dL — ABNORMAL HIGH (ref 0.61–1.24)
GFR calc Af Amer: 11 mL/min — ABNORMAL LOW (ref >60)
GFR calc non Af Amer: 9 mL/min — ABNORMAL LOW (ref >60)
Glucose, Bld: 202 mg/dL — ABNORMAL HIGH (ref 70–99)
Potassium: 4.9 mmol/L (ref 3.5–5.1)
Sodium: 136 mmol/L (ref 135–145)

## 2019-08-07 LAB — ECHOCARDIOGRAM LIMITED
Height: 74 in
Weight: 3446.23 oz

## 2019-08-07 LAB — POCT ACTIVATED CLOTTING TIME: Activated Clotting Time: 158 seconds

## 2019-08-07 LAB — CBC
HCT: 31.4 % — ABNORMAL LOW (ref 39.0–52.0)
Hemoglobin: 10.3 g/dL — ABNORMAL LOW (ref 13.0–17.0)
MCH: 32.1 pg (ref 26.0–34.0)
MCHC: 32.8 g/dL (ref 30.0–36.0)
MCV: 97.8 fL (ref 80.0–100.0)
Platelets: 134 10*3/uL — ABNORMAL LOW (ref 150–400)
RBC: 3.21 MIL/uL — ABNORMAL LOW (ref 4.22–5.81)
RDW: 15.9 % — ABNORMAL HIGH (ref 11.5–15.5)
WBC: 8.8 10*3/uL (ref 4.0–10.5)
nRBC: 0 % (ref 0.0–0.2)

## 2019-08-07 LAB — GLUCOSE, CAPILLARY
Glucose-Capillary: 237 mg/dL — ABNORMAL HIGH (ref 70–99)
Glucose-Capillary: 174 mg/dL — ABNORMAL HIGH (ref 70–99)
Glucose-Capillary: 211 mg/dL — ABNORMAL HIGH (ref 70–99)
Glucose-Capillary: 198 mg/dL — ABNORMAL HIGH (ref 70–99)
Glucose-Capillary: 177 mg/dL — ABNORMAL HIGH (ref 70–99)
Glucose-Capillary: 217 mg/dL — ABNORMAL HIGH (ref 70–99)

## 2019-08-07 MED ORDER — ACETAMINOPHEN 325 MG PO TABS
650.0000 mg | ORAL_TABLET | ORAL | Status: DC | PRN
  Administered 2019-08-11 – 2019-08-25 (×9): 650 mg via ORAL
  Filled 2019-08-07 (×10): qty 2

## 2019-08-07 MED ORDER — ASPIRIN EC 81 MG PO TBEC
81.0000 mg | DELAYED_RELEASE_TABLET | Freq: Every day | ORAL | Status: DC
  Administered 2019-08-07 – 2019-08-26 (×19): 81 mg via ORAL
  Filled 2019-08-07 (×20): qty 1

## 2019-08-07 MED ORDER — TICAGRELOR 90 MG PO TABS
90.0000 mg | ORAL_TABLET | Freq: Two times a day (BID) | ORAL | Status: DC
  Administered 2019-08-07 – 2019-08-12 (×11): 90 mg via ORAL
  Filled 2019-08-07 (×11): qty 1

## 2019-08-07 MED ORDER — INSULIN ASPART 100 UNIT/ML ~~LOC~~ SOLN
0.0000 [IU] | Freq: Three times a day (TID) | SUBCUTANEOUS | Status: DC
  Administered 2019-08-07 (×2): 3 [IU] via SUBCUTANEOUS
  Administered 2019-08-07: 5 [IU] via SUBCUTANEOUS
  Administered 2019-08-08 (×2): 3 [IU] via SUBCUTANEOUS
  Administered 2019-08-08 – 2019-08-09 (×2): 8 [IU] via SUBCUTANEOUS
  Administered 2019-08-09: 5 [IU] via SUBCUTANEOUS
  Administered 2019-08-10 – 2019-08-11 (×4): 3 [IU] via SUBCUTANEOUS
  Administered 2019-08-11 (×2): 5 [IU] via SUBCUTANEOUS
  Administered 2019-08-12: 3 [IU] via SUBCUTANEOUS
  Administered 2019-08-12: 5 [IU] via SUBCUTANEOUS
  Administered 2019-08-12: 8 [IU] via SUBCUTANEOUS
  Administered 2019-08-13: 2 [IU] via SUBCUTANEOUS
  Administered 2019-08-13: 3 [IU] via SUBCUTANEOUS
  Administered 2019-08-13: 17:00:00 11 [IU] via SUBCUTANEOUS
  Administered 2019-08-14: 3 [IU] via SUBCUTANEOUS
  Administered 2019-08-14: 8 [IU] via SUBCUTANEOUS
  Administered 2019-08-14: 3 [IU] via SUBCUTANEOUS
  Administered 2019-08-15: 15 [IU] via SUBCUTANEOUS
  Administered 2019-08-15: 5 [IU] via SUBCUTANEOUS
  Administered 2019-08-15 – 2019-08-17 (×3): 3 [IU] via SUBCUTANEOUS
  Administered 2019-08-17: 5 [IU] via SUBCUTANEOUS
  Administered 2019-08-17: 8 [IU] via SUBCUTANEOUS
  Administered 2019-08-18: 2 [IU] via SUBCUTANEOUS
  Administered 2019-08-18: 5 [IU] via SUBCUTANEOUS
  Administered 2019-08-18: 3 [IU] via SUBCUTANEOUS
  Administered 2019-08-19: 8 [IU] via SUBCUTANEOUS
  Administered 2019-08-19: 5 [IU] via SUBCUTANEOUS
  Administered 2019-08-19: 3 [IU] via SUBCUTANEOUS
  Administered 2019-08-20 (×2): 2 [IU] via SUBCUTANEOUS
  Administered 2019-08-21 (×2): 5 [IU] via SUBCUTANEOUS
  Administered 2019-08-21 – 2019-08-22 (×2): 2 [IU] via SUBCUTANEOUS
  Administered 2019-08-22: 5 [IU] via SUBCUTANEOUS
  Administered 2019-08-22: 2 [IU] via SUBCUTANEOUS
  Administered 2019-08-23: 8 [IU] via SUBCUTANEOUS
  Administered 2019-08-24: 2 [IU] via SUBCUTANEOUS
  Administered 2019-08-25: 3 [IU] via SUBCUTANEOUS

## 2019-08-07 MED ORDER — ATENOLOL 25 MG PO TABS
50.0000 mg | ORAL_TABLET | Freq: Every day | ORAL | Status: DC
  Administered 2019-08-07 – 2019-08-08 (×2): 50 mg via ORAL
  Filled 2019-08-07 (×3): qty 2

## 2019-08-07 MED ORDER — STERILE WATER FOR INJECTION IV SOLN
INTRAVENOUS | Status: DC
  Administered 2019-08-07: 03:00:00 via INTRAVENOUS
  Filled 2019-08-07: qty 850

## 2019-08-07 MED ORDER — ATROPINE SULFATE 1 MG/10ML IJ SOSY
PREFILLED_SYRINGE | INTRAMUSCULAR | Status: AC
  Filled 2019-08-07: qty 10

## 2019-08-07 MED ORDER — NITROGLYCERIN 0.4 MG SL SUBL: 0.4000 mg | SUBLINGUAL_TABLET | SUBLINGUAL | Status: DC | PRN

## 2019-08-07 MED ORDER — HYDRALAZINE HCL 50 MG PO TABS
50.0000 mg | ORAL_TABLET | Freq: Three times a day (TID) | ORAL | Status: DC
  Administered 2019-08-07 – 2019-08-09 (×6): 50 mg via ORAL
  Filled 2019-08-07 (×7): qty 1

## 2019-08-07 MED ORDER — INSULIN ASPART 100 UNIT/ML ~~LOC~~ SOLN
0.0000 [IU] | Freq: Every day | SUBCUTANEOUS | Status: DC
  Administered 2019-08-07 – 2019-08-11 (×2): 2 [IU] via SUBCUTANEOUS
  Administered 2019-08-14: 4 [IU] via SUBCUTANEOUS
  Administered 2019-08-17: 2 [IU] via SUBCUTANEOUS
  Administered 2019-08-18: 3 [IU] via SUBCUTANEOUS

## 2019-08-07 MED ORDER — TICAGRELOR 90 MG PO TABS: 90.0000 mg | ORAL_TABLET | Freq: Two times a day (BID) | ORAL | Status: DC

## 2019-08-07 MED ORDER — ATORVASTATIN CALCIUM 80 MG PO TABS
80.0000 mg | ORAL_TABLET | Freq: Every day | ORAL | Status: DC
  Administered 2019-08-07 – 2019-08-25 (×18): 80 mg via ORAL
  Filled 2019-08-07 (×18): qty 1

## 2019-08-07 MED ORDER — CHLORHEXIDINE GLUCONATE CLOTH 2 % EX PADS
6.0000 | MEDICATED_PAD | Freq: Every day | CUTANEOUS | Status: DC
  Administered 2019-08-07 – 2019-08-22 (×12): 6 via TOPICAL

## 2019-08-07 MED ORDER — NITROGLYCERIN IN D5W 200-5 MCG/ML-% IV SOLN
2.0000 ug/min | INTRAVENOUS | Status: DC
  Administered 2019-08-07: 125 ug/min via INTRAVENOUS
  Administered 2019-08-07: 150 ug/min via INTRAVENOUS
  Filled 2019-08-07 (×2): qty 250

## 2019-08-07 MED ORDER — ONDANSETRON HCL 4 MG/2ML IJ SOLN
4.0000 mg | Freq: Four times a day (QID) | INTRAMUSCULAR | Status: DC | PRN
  Administered 2019-08-20: 15:00:00 4 mg via INTRAVENOUS

## 2019-08-07 MED ORDER — LABETALOL HCL 5 MG/ML IV SOLN
10.0000 mg | Freq: Once | INTRAVENOUS | Status: AC
  Administered 2019-08-07: 10 mg via INTRAVENOUS
  Filled 2019-08-07: qty 4

## 2019-08-07 NOTE — Progress Notes (Signed)
  Echocardiogram 2D Echocardiogram has been performed.  [name] 08/07/2019, 1:41 PM

## 2019-08-07 NOTE — Progress Notes (Signed)
DAILY PROGRESS NOTE   Patient Name: [name] Date of Encounter: 08/07/2019 Cardiologist: No primary care provider on file.  Chief Complaint   No chest pain  Patient Profile   [name] is a 78 y.o. male with coronary artery disease s/p CABG x3 with a LIMA to LAD, SVG to OM, and SVG to RCA in 1997, carotid artery disease s/p endarterectomy x2, type 2 diabetes mellitus, HTN, HLD,CKD,and prior stroke, admitted to [facility] with chest pain and elevated troponin consistent with NSTEMI.  Subjective   No issues overnight. No chest pain today. He is making urine - foley in place. On bicarb gtts and nitro gtts for increased BP overnight. No S/S of tamponade physiology. Plan limited echo today.  Objective   Vitals:   08/07/19 0800 08/07/19 0900 08/07/19 0930 08/07/19 1000  BP: 131/65 135/64 (!) 124/45 (!) 117/54  Pulse:  77 78 76  Resp: 19 18 (!) 21 19  Temp: 98.4 F (36.9 C)     TempSrc: Oral     SpO2: 96% 96% 96% 96%    Intake/Output Summary (Last 24 hours) at 08/07/2019 1051 Last data filed at 08/07/2019 1000 Gross per 24 hour  Intake 622.11 ml  Output 820 ml  Net -197.89 ml   There were no vitals filed for this visit.  Physical Exam   General appearance: alert and no distress Neck: JVD - 2 cm above sternal notch, no carotid bruit and thyroid not enlarged, symmetric, no tenderness/mass/nodules Lungs: diminished breath sounds bibasilar Heart: regular rate and rhythm Abdomen: soft, non-tender; bowel sounds normal; no masses,  no organomegaly Extremities: extremities normal, atraumatic, no cyanosis or edema Pulses: 2+ and symmetric Skin: Skin color, texture, turgor normal. No rashes or lesions Neurologic: Grossly normal Psych: Pleasant  Inpatient Medications    Scheduled Meds: . aspirin EC  81 mg Oral Daily  . atorvastatin  80 mg Oral q1800  . atropine      . Chlorhexidine Gluconate Cloth  6 each Topical Daily  . hydrALAZINE  50 mg Oral Q8H  . insulin  aspart  0-15 Units Subcutaneous TID WC  . insulin aspart  0-5 Units Subcutaneous QHS  . ticagrelor  90 mg Oral BID    Continuous Infusions: . nitroGLYCERIN 130 mcg/min (08/07/19 1000)  .  sodium bicarbonate (isotonic) infusion in sterile water 50 mL/hr at 08/07/19 1000    PRN Meds: acetaminophen, nitroGLYCERIN, ondansetron (ZOFRAN) IV   Labs   Results for orders placed or performed during the hospital encounter of 08/06/19 (from the past 48 hour(s))  Glucose, capillary     Status: Abnormal   Collection Time: 08/06/19 11:52 PM  Result Value Ref Range   Glucose-Capillary 195 (H) 70 - 99 mg/dL  Basic metabolic panel     Status: Abnormal   Collection Time: 08/07/19  2:30 AM  Result Value Ref Range   Sodium 136 135 - 145 mmol/L   Potassium 4.9 3.5 - 5.1 mmol/L   Chloride 108 98 - 111 mmol/L   CO2 16 (L) 22 - 32 mmol/L   Glucose, Bld 202 (H) 70 - 99 mg/dL   BUN 53 (H) 8 - 23 mg/dL   Creatinine, Ser 5.49 (H) 0.61 - 1.24 mg/dL   Calcium 8.2 (L) 8.9 - 10.3 mg/dL   GFR calc non Af Amer 9 (L) >60 mL/min   GFR calc Af Amer 11 (L) >60 mL/min   Anion gap 12 5 - 15    Comment: Performed at [facility], [place] [address], [place], [place] [address]  CBC     Status: Abnormal   Collection Time: 08/07/19  2:30 AM  Result Value Ref Range   WBC 8.8 4.0 - 10.5 K/uL   RBC 3.21 (L) 4.22 - 5.81 MIL/uL   Hemoglobin 10.3 (L) 13.0 - 17.0 g/dL   HCT 31.4 (L) 39.0 - 52.0 %   MCV 97.8 80.0 - 100.0 fL   MCH 32.1 26.0 - 34.0 pg   MCHC 32.8 30.0 - 36.0 g/dL   RDW 15.9 (H) 11.5 - 15.5 %   Platelets 134 (L) 150 - 400 K/uL   nRBC 0.0 0.0 - 0.2 %    Comment: Performed at [place] [address], [place], [place] [address]  Glucose, capillary     Status: Abnormal   Collection Time: 08/07/19  3:00 AM  Result Value Ref Range   Glucose-Capillary 174 (H) 70 - 99 mg/dL  Glucose, capillary     Status: Abnormal   Collection Time: 08/07/19  7:58 AM  Result Value Ref Range   Glucose-Capillary  211 (H) 70 - 99 mg/dL    ECG   Sinus rhythm with 1st degree AVB, PAC's, peaked T waves - Personally Reviewed  Telemetry   Sinus rhythm - Personally Reviewed  Radiology    Dg Chest Port 1 View  Result Date: 08/06/2019 CLINICAL DATA:  Chest pain post cath lab EXAM: PORTABLE CHEST 1 VIEW COMPARISON:  08/04/2019, 08/11/2017 FINDINGS: Post sternotomy changes. Cardiomegaly with vascular congestion and mild diffuse interstitial opacities slightly increased and perhaps related to mild edema. Elevated right diaphragm. Aortic atherosclerosis. No pneumothorax. IMPRESSION: Cardiomegaly with vascular congestion and mild diffuse interstitial opacities suspicious for slight edema. Electronically Signed   By: Donavan Foil M.D.   On: 08/06/2019 19:47    Cardiac Studies   Procedure: 2D Echo, Cardiac Doppler and Color Doppler  Indications:     R07.9 Chest Pain   History:         Patient has prior history of Echocardiogram examinations. CAD,                  Prior CABG; CKD Risk Factors:Hypertension, Diabetes and                  Dyslipidemia.   Sonographer:     Charmayne Sheer RDCS (AE) Referring Phys:  [place] Diagnosing Phys: Serafina Royals MD    Sonographer Comments: Suboptimal subcostal window. IMPRESSIONS    1. Left ventricular ejection fraction, by visual estimation, is 55 to 60%. The left ventricle has normal function. There is moderately increased left ventricular hypertrophy.  2. Global right ventricle has normal systolic function.The right ventricular size is normal. No increase in right ventricular wall thickness.  3. Left atrial size was normal.  4. Right atrial size was normal.  5. The mitral valve is normal in structure. Mild mitral valve regurgitation.  6. The tricuspid valve is normal in structure. Tricuspid valve regurgitation is trivial.  7. The aortic valve is bicuspid Aortic valve regurgitation is trivial by color flow Doppler. Mild aortic valve  sclerosis without stenosis.  8. The pulmonic valve was normal in structure. Pulmonic valve regurgitation is trivial by color flow Doppler.  9. Normal pulmonary artery systolic pressure.  LEFT HEART CATH AND CORS/GRAFTS ANGIOGRAPHY  Conclusion    Ost RCA to Prox RCA lesion is 100% stenosed.  Ost Cx lesion is 75% stenosed.  Ramus lesion is 75% stenosed.  Dist LM to Prox LAD lesion is 85% stenosed.  Mid Graft lesion is 85% stenosed.  Origin lesion is 100% stenosed.   78 year old male with hypertension hyperlipidemia and known coronary artery bypass surgery in the past with acute on chronic kidney disease needing dialysis treatment.  The patient has acute on this set of chest discomfort intermittent over the last 48 hours with elevated troponin consistent with non-ST elevation myocardial infarction.  Left ventricle not injected due to kidney disease  100% stenosis of right coronary artery ostium Distal left main coronary atherosclerosis of 75 to 80% to trifurcation of left anterior descending artery, circumflex artery, and ramus artery Patent mammary graft to the left anterior descending artery Occluded saphenous vein graft to obtuse marginal 1 Patent but significant stenosis and thrombosed proximal graft to right coronary artery  Plan PCI stent placement of graft to right coronary artery Dual antiplatelet therapy Heparin Continue  high intensity cholesterol therapy No change in medication management for hypertension control    Assessment   Principal Problem:   Non-ST elevation (NSTEMI) myocardial infarction [facility]) Active Problems:   Hyperlipidemia associated with type 2 diabetes mellitus ([place])   Hypertension in stage 4 chronic kidney disease due to type 2 diabetes mellitus ([place])   Uncontrolled type 2 diabetes mellitus with stage 4 chronic kidney disease, with long-term current use of insulin ([facility])   NSTEMI (non-ST elevated myocardial infarction) ([place])   Plan   1.  Mr. Dahlem seems improved compared to the description of his condition by Dr. Saunders Revel yesterday- no chest pain today. Still on nitro gtts - will attempt to wean today. Can d/c bicarb fluids. Creatinine stable, he is making urine. Ok to d/c foley today and monitor for urinary retention. Restart home atenolol 50 mg daily. Await limited echo today - if no significant pericardial effusion, may be able to transfer out of unit and d/c home tomorrow.  Time Spent Directly with Patient:  I have spent a total of 35 minutes with the patient reviewing hospital notes, telemetry, EKGs, labs and examining the patient as well as establishing an assessment and plan that was discussed personally with the patient.  > 50% of time was spent in direct patient care.  Length of Stay:  LOS: 1 day   Pixie Casino, MD, [facility], [place] Director of the Advanced Lipid Disorders &  Cardiovascular Risk Reduction Clinic Diplomate of the American Board of Clinical Lipidology Attending Cardiologist  Direct Dial: [phone]  Fax: [phone]  Website:  [email or web address] Osgood Raylynne Cubbage 08/07/2019, 10:51 AM

## 2019-08-07 NOTE — Plan of Care (Signed)

## 2019-08-07 NOTE — Progress Notes (Addendum)
Notified by CMT that Mr. [name] had 12 beats V-tach- checked on patient- no complaints. Will continue to monitor closely

## 2019-08-08 LAB — CBC
HCT: 29.8 % — ABNORMAL LOW (ref 39.0–52.0)
Hemoglobin: 10.2 g/dL — ABNORMAL LOW (ref 13.0–17.0)
MCH: 33.1 pg (ref 26.0–34.0)
MCHC: 34.2 g/dL (ref 30.0–36.0)
MCV: 96.8 fL (ref 80.0–100.0)
Platelets: 122 10*3/uL — ABNORMAL LOW (ref 150–400)
RBC: 3.08 MIL/uL — ABNORMAL LOW (ref 4.22–5.81)
RDW: 15.9 % — ABNORMAL HIGH (ref 11.5–15.5)
WBC: 8.8 10*3/uL (ref 4.0–10.5)
nRBC: 0.2 % (ref 0.0–0.2)

## 2019-08-08 LAB — BASIC METABOLIC PANEL
Anion gap: 12 (ref 5–15)
BUN: 55 mg/dL — ABNORMAL HIGH (ref 8–23)
CO2: 18 mmol/L — ABNORMAL LOW (ref 22–32)
Calcium: 8.1 mg/dL — ABNORMAL LOW (ref 8.9–10.3)
Chloride: 102 mmol/L (ref 98–111)
Creatinine, Ser: 6.31 mg/dL — ABNORMAL HIGH (ref 0.61–1.24)
GFR calc Af Amer: 9 mL/min — ABNORMAL LOW (ref >60)
GFR calc non Af Amer: 8 mL/min — ABNORMAL LOW (ref >60)
Glucose, Bld: 195 mg/dL — ABNORMAL HIGH (ref 70–99)
Potassium: 5.2 mmol/L — ABNORMAL HIGH (ref 3.5–5.1)
Sodium: 132 mmol/L — ABNORMAL LOW (ref 135–145)

## 2019-08-08 LAB — GLUCOSE, CAPILLARY
Glucose-Capillary: 199 mg/dL — ABNORMAL HIGH (ref 70–99)
Glucose-Capillary: 256 mg/dL — ABNORMAL HIGH (ref 70–99)
Glucose-Capillary: 157 mg/dL — ABNORMAL HIGH (ref 70–99)
Glucose-Capillary: 169 mg/dL — ABNORMAL HIGH (ref 70–99)

## 2019-08-08 MED ORDER — ISOSORBIDE MONONITRATE ER 30 MG PO TB24
30.0000 mg | ORAL_TABLET | Freq: Every day | ORAL | Status: DC
  Administered 2019-08-08: 30 mg via ORAL
  Filled 2019-08-08 (×2): qty 1

## 2019-08-08 MED ORDER — GUAIFENESIN 100 MG/5ML PO SOLN
5.0000 mL | ORAL | Status: DC | PRN
  Administered 2019-08-08 – 2019-08-13 (×3): 100 mg via ORAL
  Filled 2019-08-08 (×3): qty 5

## 2019-08-08 MED ORDER — CARVEDILOL 12.5 MG PO TABS
12.5000 mg | ORAL_TABLET | Freq: Two times a day (BID) | ORAL | Status: DC
  Administered 2019-08-08: 12.5 mg via ORAL
  Filled 2019-08-08 (×2): qty 1

## 2019-08-08 NOTE — Progress Notes (Signed)
States he is having 3/10 chest pain after eating breakfast. Denies SOB or other symptoms. NTG gtt increased to 20 mcg/min.

## 2019-08-08 NOTE — Progress Notes (Signed)
Resting in bed. States that chest pain is 1/10, and better while resting. Wife into room at bedside. No needs expressed at this time.

## 2019-08-08 NOTE — Progress Notes (Signed)
EKG this am reads STEMI. No apparent change from previous. Daleen Snook Kroeger notified. Dr Debara Pickett rounding this am.

## 2019-08-08 NOTE — Progress Notes (Signed)
Cont to c/o of 3/10 mid sternal chest pain. States this is unchanged from previous. NTG gtt increased to 79mcg/min.

## 2019-08-08 NOTE — Progress Notes (Signed)
Progress Note  Patient Name: [name] Date of Encounter: 08/08/2019  Primary Cardiologist: Nehemiah Massed  Subjective   Pt developed 3/10 c/p upon awakening earlier this AM.  He is now on IV ntg @ 30 mcg/min and notes significant improvement - currently 1/10.  No dyspnea.  Inpatient Medications    Scheduled Meds: . aspirin EC  81 mg Oral Daily  . atenolol  50 mg Oral Daily  . atorvastatin  80 mg Oral q1800  . Chlorhexidine Gluconate Cloth  6 each Topical Daily  . hydrALAZINE  50 mg Oral Q8H  . insulin aspart  0-15 Units Subcutaneous TID WC  . insulin aspart  0-5 Units Subcutaneous QHS  . ticagrelor  90 mg Oral BID   Continuous Infusions: . nitroGLYCERIN 30 mcg/min (08/08/19 0929)   PRN Meds: acetaminophen, nitroGLYCERIN, ondansetron (ZOFRAN) IV   Vital Signs    Vitals:   08/08/19 0644 08/08/19 0758 08/08/19 0908 08/08/19 0930  BP: 132/88 (!) 146/68 (!) 145/71   Pulse:  68 86 80  Resp:   (!) 22 19  Temp:  98.2 F (36.8 C)    TempSrc:  Oral    SpO2:   95% 95%  Weight:        Intake/Output Summary (Last 24 hours) at 08/08/2019 1100 Last data filed at 08/08/2019 0622 Gross per 24 hour  Intake 659.83 ml  Output 125 ml  Net 534.83 ml   Filed Weights   08/08/19 0622  Weight: 99 kg    Physical Exam   GEN: Well nourished, well developed, in no acute distress.  HEENT: Grossly normal.  Neck: Supple, no JVD, carotid bruits, or masses. Cardiac: RRR, 2/6 syst murmur throughout, no rubs, or gallops. No clubbing, cyanosis, edema.  Radials/DP/PT 2+ and equal bilaterally. R groin cath site w/o bleeding/bruit/hematoma. Respiratory:  Respirations regular and unlabored, clear to auscultation bilaterally. GI: Soft, nontender, nondistended, BS + x 4. MS: no deformity or atrophy. Skin: warm and dry, no rash. Neuro:  Strength and sensation are intact. Psych: AAOx3.  Normal affect.  Labs    Chemistry Recent Labs  Lab 08/05/19 0439 08/06/19 1758 08/07/19 0230  08/08/19 0427  NA 138  --  136 132*  K 4.5  --  4.9 5.2*  CL 111  --  108 102  CO2 18*  --  16* 18*  GLUCOSE 210*  --  202* 195*  BUN 58*  --  53* 55*  CREATININE 5.72* 5.38* 5.49* 6.31*  CALCIUM 8.2*  --  8.2* 8.1*  GFRNONAA 9* 9* 9* 8*  GFRAA 10* 11* 11* 9*  ANIONGAP 9  --  12 12     Hematology Recent Labs  Lab 08/06/19 1758 08/07/19 0230 08/08/19 0427  WBC 6.0 8.8 8.8  RBC 3.41* 3.21* 3.08*  HGB 11.0* 10.3* 10.2*  HCT 33.0* 31.4* 29.8*  MCV 96.8 97.8 96.8  MCH 32.3 32.1 33.1  MCHC 33.3 32.8 34.2  RDW 16.1* 15.9* 15.9*  PLT 121* 134* 122*    Cardiac Enzymes  Recent Labs  Lab 08/04/19 2250 08/05/19 0043 08/05/19 0818 08/05/19 1624  TROPONINIHS 471* 507* 1,266* 1,123*     Radiology    Dg Chest Port 1 View  Result Date: 08/06/2019 CLINICAL DATA:  Chest pain post cath lab EXAM: PORTABLE CHEST 1 VIEW COMPARISON:  08/04/2019, 08/11/2017 FINDINGS: Post sternotomy changes. Cardiomegaly with vascular congestion and mild diffuse interstitial opacities slightly increased and perhaps related to mild edema. Elevated right diaphragm. Aortic atherosclerosis. No pneumothorax.  IMPRESSION: Cardiomegaly with vascular congestion and mild diffuse interstitial opacities suspicious for slight edema. Electronically Signed   By: Donavan Foil M.D.   On: 08/06/2019 19:47    Telemetry    RSR, brief run of nsvt - Personally Reviewed  ECG    RSR, 84, 1st deg avb, LAD, baseline artifact w/ ? Of inf ST elev, hyperacute TW in ant leads - similar to prior - Personally Reviewed  Cardiac Studies   Cardiac Catheterization and Percutaneous Coronary Intervention 10.23.2020  Conclusions: 1. See diagnostic angiogram by Dr. Nehemiah Massed for details of coronary/graft anatomy. 2. Severe disease involving SVG-RCA with 80-90% stenosis. 3. Successful PCI to SVG-RCA using Resolute Onyx 4.0 x 38 mm drug-eluting stent with 10% residual stenosis and TIMI-3 flow. 4. Focal perforation of the SVG-RCA  during stent deployment.  This was successfully contained with internal tamponade.   Recommendations: 1. Dual antiplatelet therapy with aspirin and ticagrelor for at least 12 months. 2. Aggressive secondary prevention. 3. Transfer to [facility] for ICU monitoring, given focal perforation of SVG-RCA.  If the patient were to become hemodynamically unstable or develop worsening anemia, repeat angiography of SVG-RCA and possible covered stent placement would need to be considered. _____________   2D Echocardiogram 10.24.2020   IMPRESSIONS     1. Left ventricular ejection fraction, by visual estimation, is 60 to 65%. The left ventricle has normal function. Normal left ventricular size. There is no left ventricular hypertrophy.  2. Global right ventricle has normal systolic function.The right ventricular size is normal. No increase in right ventricular wall thickness.  3. Left atrial size was normal.  4. Right atrial size was norma  5. Severe mitral annular calcification. Mild calcification of the anterior mitral valve leaflet(s).. No evidence of mitral valve regurgitation. Mild mitral stenosis.  6. The tricuspid valve is normal in structure. Tricuspid valve regurgitation is mild.  7. The aortic valve is tricuspid Aortic valve regurgitation is mild by color flow Doppler. Moderate aortic valve sclerosis/calcification without any evidence of aortic stenosis.  8. The pulmonic valve was normal in structure. Pulmonic valve regurgitation is not visualized by color flow Doppler.  9. Normal pulmonary artery systolic pressure. 10. The inferior vena cava is normal in size with greater than 50% respiratory variability, suggesting right atrial pressure of 3 mmHg. 11. No evidence of pericardial effusion. _____________   Patient Profile     78 y.o. male w/ a h/o CAD s/p CABG x 3 in 1997, carotid dzs s/p CEA x 2, DMII, HTN, HL, CKD V, and prior CVA, who was tx from [facility] following nstemi and PCI of the  VG  RCA w/ perforation of the graft.  Assessment & Plan    1.  NSTEMI/CAD/Recurrent Angina:  S/p PCI/DES to the VG  RCA on 44/96 complicated by focal perforation of the graft.  He was tx to [facility] for ICU care.  He felt well yesterday and was tx to the floor.  Limited echo yesterday w/ nl EF and no effusion.  He developed recurrent c/p this AM.  ECG w/ baseline artifact making interpretation difficult - ? ST elev in II, III.  C/p now 1/10 on 30 mcgs of IV ntg.  Hemodynamically stable.  H/H stable.  Wean IV ntg and start oral nitrate.  Cont asa, statin, brilinta.  With renal failure and ongoing HTN, I am going to change his atenolol to carvedilol.  If he has recurrent c/p, he may require relook cath and nephrology consult.  2.  Essential  HTN:  BP elevated.  Changing  blocker and adding oral nitrate.  3.  HL:  LDL 82.  Cont lipitor 80. Lower last year.  Cont high potency statin rx and f/u as outpt.  4.  CKD V:  Creat up overnight s/p contrast 10/23.  Good uo past two days.  As above, if creat cont to rise, uo drops, or he requires repeat angiography, will ask nephrology to see.  He is followed by [facility] nephrology as an outpt.  5.  DMII:  A1c 8.5.  Cont insulin.  Signed, Murray Hodgkins, NP  08/08/2019, 11:00 AM    For questions or updates, please contact   Please consult [email or web address] for contact info under Cardiology/STEMI.

## 2019-08-09 ENCOUNTER — Inpatient Hospital Stay (HOSPITAL_COMMUNITY): Payer: Medicare HMO

## 2019-08-09 ENCOUNTER — Encounter: Payer: Self-pay | Admitting: Internal Medicine

## 2019-08-09 DIAGNOSIS — Z955 Presence of coronary angioplasty implant and graft: Secondary | ICD-10-CM

## 2019-08-09 DIAGNOSIS — N179 Acute kidney failure, unspecified: Secondary | ICD-10-CM

## 2019-08-09 DIAGNOSIS — J9601 Acute respiratory failure with hypoxia: Secondary | ICD-10-CM

## 2019-08-09 DIAGNOSIS — J810 Acute pulmonary edema: Secondary | ICD-10-CM

## 2019-08-09 LAB — CBC
HCT: 34.5 % — ABNORMAL LOW (ref 39.0–52.0)
Hemoglobin: 11.3 g/dL — ABNORMAL LOW (ref 13.0–17.0)
MCH: 32.1 pg (ref 26.0–34.0)
MCHC: 32.8 g/dL (ref 30.0–36.0)
MCV: 98 fL (ref 80.0–100.0)
Platelets: 117 10*3/uL — ABNORMAL LOW (ref 150–400)
RBC: 3.52 MIL/uL — ABNORMAL LOW (ref 4.22–5.81)
RDW: 16 % — ABNORMAL HIGH (ref 11.5–15.5)
WBC: 8.7 10*3/uL (ref 4.0–10.5)
nRBC: 0.2 % (ref 0.0–0.2)

## 2019-08-09 LAB — RENAL FUNCTION PANEL
Albumin: 2.4 g/dL — ABNORMAL LOW (ref 3.5–5.0)
Anion gap: 14 (ref 5–15)
BUN: 81 mg/dL — ABNORMAL HIGH (ref 8–23)
CO2: 16 mmol/L — ABNORMAL LOW (ref 22–32)
Calcium: 8 mg/dL — ABNORMAL LOW (ref 8.9–10.3)
Chloride: 101 mmol/L (ref 98–111)
Creatinine, Ser: 8.68 mg/dL — ABNORMAL HIGH (ref 0.61–1.24)
GFR calc Af Amer: 6 mL/min — ABNORMAL LOW (ref >60)
GFR calc non Af Amer: 5 mL/min — ABNORMAL LOW (ref >60)
Glucose, Bld: 116 mg/dL — ABNORMAL HIGH (ref 70–99)
Phosphorus: 4.9 mg/dL — ABNORMAL HIGH (ref 2.5–4.6)
Potassium: 5.3 mmol/L — ABNORMAL HIGH (ref 3.5–5.1)
Sodium: 131 mmol/L — ABNORMAL LOW (ref 135–145)

## 2019-08-09 LAB — URINALYSIS, ROUTINE W REFLEX MICROSCOPIC
Bilirubin Urine: NEGATIVE
Glucose, UA: >=500 mg/dL — AB
Ketones, ur: 15 mg/dL — AB
Nitrite: NEGATIVE
Protein, ur: >300 mg/dL — AB
Specific Gravity, Urine: 1.02 (ref 1.005–1.030)
pH: 6.5 (ref 5.0–8.0)

## 2019-08-09 LAB — URINALYSIS, MICROSCOPIC (REFLEX)

## 2019-08-09 LAB — GLUCOSE, CAPILLARY
Glucose-Capillary: 267 mg/dL — ABNORMAL HIGH (ref 70–99)
Glucose-Capillary: 214 mg/dL — ABNORMAL HIGH (ref 70–99)
Glucose-Capillary: 111 mg/dL — ABNORMAL HIGH (ref 70–99)
Glucose-Capillary: 154 mg/dL — ABNORMAL HIGH (ref 70–99)

## 2019-08-09 LAB — BASIC METABOLIC PANEL
Anion gap: 14 (ref 5–15)
BUN: 75 mg/dL — ABNORMAL HIGH (ref 8–23)
CO2: 14 mmol/L — ABNORMAL LOW (ref 22–32)
Calcium: 7.8 mg/dL — ABNORMAL LOW (ref 8.9–10.3)
Chloride: 101 mmol/L (ref 98–111)
Creatinine, Ser: 8.14 mg/dL — ABNORMAL HIGH (ref 0.61–1.24)
GFR calc Af Amer: 7 mL/min — ABNORMAL LOW (ref >60)
GFR calc non Af Amer: 6 mL/min — ABNORMAL LOW (ref >60)
Glucose, Bld: 264 mg/dL — ABNORMAL HIGH (ref 70–99)
Potassium: 5.3 mmol/L — ABNORMAL HIGH (ref 3.5–5.1)
Sodium: 129 mmol/L — ABNORMAL LOW (ref 135–145)

## 2019-08-09 MED ORDER — HEPARIN BOLUS VIA INFUSION (CRRT)
1000.0000 [IU] | INTRAVENOUS | Status: DC | PRN
  Administered 2019-08-10: 1000 [IU] via INTRAVENOUS_CENTRAL
  Filled 2019-08-09: qty 1000

## 2019-08-09 MED ORDER — HEPARIN SODIUM (PORCINE) 1000 UNIT/ML DIALYSIS
1000.0000 [IU] | INTRAMUSCULAR | Status: DC | PRN
  Administered 2019-08-12: 2400 [IU] via INTRAVENOUS_CENTRAL
  Filled 2019-08-09 (×2): qty 6

## 2019-08-09 MED ORDER — HEPARIN SODIUM (PORCINE) 1000 UNIT/ML DIALYSIS
1000.0000 [IU] | INTRAMUSCULAR | Status: DC | PRN
  Filled 2019-08-09 (×2): qty 6

## 2019-08-09 MED ORDER — FENTANYL CITRATE (PF) 100 MCG/2ML IJ SOLN
INTRAMUSCULAR | Status: AC
  Filled 2019-08-09: qty 2

## 2019-08-09 MED ORDER — PRISMASOL BGK 4/2.5 32-4-2.5 MEQ/L REPLACEMENT SOLN
Status: DC
  Administered 2019-08-09 – 2019-08-11 (×3): via INTRAVENOUS_CENTRAL

## 2019-08-09 MED ORDER — NOREPINEPHRINE 4 MG/250ML-% IV SOLN
INTRAVENOUS | Status: AC
  Filled 2019-08-09: qty 250

## 2019-08-09 MED ORDER — SODIUM CHLORIDE 0.9 % IV SOLN
250.0000 [IU]/h | INTRAVENOUS | Status: DC
  Administered 2019-08-10: 09:00:00 1250 [IU]/h via INTRAVENOUS_CENTRAL
  Administered 2019-08-10: 250 [IU]/h via INTRAVENOUS_CENTRAL
  Administered 2019-08-10: 1650 [IU]/h via INTRAVENOUS_CENTRAL
  Administered 2019-08-10: 1750 [IU]/h via INTRAVENOUS_CENTRAL
  Administered 2019-08-11: 04:00:00 1450 [IU]/h via INTRAVENOUS_CENTRAL
  Administered 2019-08-11: 19:00:00 1300 [IU]/h via INTRAVENOUS_CENTRAL
  Administered 2019-08-12: 950 [IU]/h via INTRAVENOUS_CENTRAL
  Filled 2019-08-09 (×10): qty 2

## 2019-08-09 MED ORDER — LEVOTHYROXINE SODIUM 100 MCG PO TABS
300.0000 ug | ORAL_TABLET | Freq: Every day | ORAL | Status: DC
  Administered 2019-08-10 – 2019-08-25 (×15): 300 ug via ORAL
  Filled 2019-08-09 (×15): qty 3

## 2019-08-09 MED ORDER — ALBUTEROL SULFATE (2.5 MG/3ML) 0.083% IN NEBU
2.5000 mg | INHALATION_SOLUTION | RESPIRATORY_TRACT | Status: DC | PRN
  Administered 2019-08-09 – 2019-08-12 (×2): 2.5 mg via RESPIRATORY_TRACT
  Filled 2019-08-09: qty 3

## 2019-08-09 MED ORDER — NOREPINEPHRINE 4 MG/250ML-% IV SOLN
0.0000 ug/min | INTRAVENOUS | Status: DC
  Administered 2019-08-09: 2 ug/min via INTRAVENOUS
  Administered 2019-08-09: 6 ug/min via INTRAVENOUS
  Administered 2019-08-10: 10 ug/min via INTRAVENOUS
  Filled 2019-08-09 (×3): qty 250

## 2019-08-09 MED ORDER — PRISMASOL BGK 0/2.5 32-2.5 MEQ/L IV SOLN
INTRAVENOUS | Status: DC
  Administered 2019-08-09 – 2019-08-12 (×20): via INTRAVENOUS_CENTRAL
  Filled 2019-08-09 (×35): qty 5000

## 2019-08-09 MED ORDER — HEPARIN SODIUM (PORCINE) 1000 UNIT/ML DIALYSIS
1000.0000 [IU] | INTRAMUSCULAR | Status: DC | PRN
  Administered 2019-08-13: 20:00:00 1000 [IU] via INTRAVENOUS_CENTRAL
  Administered 2019-08-23: 18:00:00 3400 [IU] via INTRAVENOUS_CENTRAL
  Administered 2019-08-25: 12:00:00 1000 [IU] via INTRAVENOUS_CENTRAL
  Filled 2019-08-09 (×4): qty 1

## 2019-08-09 MED ORDER — PANTOPRAZOLE SODIUM 40 MG IV SOLR
40.0000 mg | Freq: Two times a day (BID) | INTRAVENOUS | Status: DC
  Administered 2019-08-09 – 2019-08-12 (×7): 40 mg via INTRAVENOUS
  Filled 2019-08-09 (×7): qty 40

## 2019-08-09 MED ORDER — ALBUTEROL SULFATE (2.5 MG/3ML) 0.083% IN NEBU
INHALATION_SOLUTION | RESPIRATORY_TRACT | Status: AC
  Administered 2019-08-09: 04:00:00
  Filled 2019-08-09: qty 3

## 2019-08-09 MED ORDER — MIDAZOLAM HCL 2 MG/2ML IJ SOLN
INTRAMUSCULAR | Status: AC
  Filled 2019-08-09: qty 2

## 2019-08-09 MED ORDER — FUROSEMIDE 10 MG/ML IJ SOLN
120.0000 mg | Freq: Two times a day (BID) | INTRAVENOUS | Status: DC
  Administered 2019-08-09 – 2019-08-10 (×2): 120 mg via INTRAVENOUS
  Filled 2019-08-09: qty 12
  Filled 2019-08-09 (×2): qty 10
  Filled 2019-08-09 (×4): qty 12

## 2019-08-09 MED ORDER — HEPARIN SODIUM (PORCINE) 1000 UNIT/ML DIALYSIS
1000.0000 [IU] | INTRAMUSCULAR | Status: DC | PRN
  Filled 2019-08-09: qty 6

## 2019-08-09 MED ORDER — SODIUM CHLORIDE 0.9 % FOR CRRT
INTRAVENOUS_CENTRAL | Status: DC | PRN
  Filled 2019-08-09: qty 1000

## 2019-08-09 NOTE — Progress Notes (Signed)
Pt complaining of cough and not breathing well- O2 sats 94 % on RA- placed on 2L for comfort- gave pt Mucinex and had him sit up on side of the bed. He was not exhibiting distress. Ask Tiffany with Resp to evaluate- she gave pt a breathing tx-  I rechecked on pt and he was sleeping- will continue to monitor

## 2019-08-09 NOTE — Progress Notes (Signed)
Paged Cardiology on call Bhagat to request portable cxray

## 2019-08-09 NOTE — Procedures (Addendum)
Hemodialysis Catheter Insertion Procedure Note [name] [id number] [birth year]  Procedure: Insertion of Hemodialysis Catheter Indications: Dialysis Access   Procedure Details Consent: Risks of procedure as well as the alternatives and risks of each were explained to the (patient/caregiver).  Consent for procedure obtained. Time Out: Verified patient identification, verified procedure, site/side was marked, verified correct patient position, special equipment/implants available, medications/allergies/relevent history reviewed, required imaging and test results available.  Performed  Maximum sterile technique was used including antiseptics, cap, gloves, gown, hand hygiene, mask and sheet. Skin prep: Chlorhexidine; local anesthetic administered Triple lumen hemodialysis catheter was inserted into right internal jugular vein using the Seldinger technique.  Evaluation Blood flow good Complications: No apparent complications Patient did tolerate procedure well. Chest X-ray ordered to verify placement.  CXR: pending.  [name], NP-C [facility] Contact information can be found on Amion  After hours pager: [phone]. 08/09/2019, 2:13 PM   Baltazar Apo, MD, PhD 08/17/2019, 10:29 AM [facility] [phone] or if no answer [phone]

## 2019-08-09 NOTE — Consult Note (Signed)
NAME:  [name], MRN:  [id number], DOB:  March 28, 1941, LOS: 3 ADMISSION DATE:  08/06/2019, CONSULTATION DATE:  10/26 REFERRING MD: Debara Pickett  , CHIEF COMPLAINT:  Acute respiratory failure   Brief History   78 year old male patient with prior stage IV CKD and severe underlying coronary artery disease.  Initially admitted to [facility] with non-ST elevation MI.  Underwent cardiac catheterization on 10/23 had small localized perforation of SVG during stent placement, transferred to [facility] because of this.  No further cardiac interventions completed however developed progressive renal failure and associated respiratory failure ultimately requiring critical care and nephrology consult on 10/26.  History of present illness    This is a pleasant 78 year old white male with a significant history of coronary artery disease as mentioned below.  He was admitted to [facility] initially with chief complaint of chest pain, elevated troponin and working diagnosis of non-ST elevation MI.  He went for cardiac catheterization at [facility] regional, catheterization was notable for chronic occlusion of the SVG to OM with severe distal disease of the RCA which was felt to be the culprit lesion.  Intervention was complicated by focal perforation of the SVG during stent placement, contrast extravasation was controlled with balloon tamponade, however contained extravasation with still evident and because of this he was transferred to [facility]  As noted in history has a history of stage IV chronic kidney disease, on 10/24 he was started on bicarbonate drip as well as nitro infusion for increased hypertension.  Urine output was minimal.  Limited echocardiogram was obtained on 10/24 this was negative for pericardial effusion.  He did have recurrent chest pain the a.m. of 10/25 treated with nitroglycerin.  On 10/26 his renal function had continued to worsen.  Creatinine now up to 8.  Volume overloaded with worsening tachypnea,  cough, oxygen requirements.  Chest x-ray showing pulmonary edema.  Critical care asked to evaluate for ICU transfer as well as possible dialysis catheter placement and potential need for escalated pulmonary support Past Medical History  Coronary artery disease status post CABG x3 vessels in 1997.  Carotid artery disease status post CEA x2, diabetes type 2, hypertension, hyperlipidemia, CKD stage IV, prior CVA.  Most recently non-ST elevation MI with PCI to the venous graft to RCA complicated by perforation of graft.  Significant Hospital Events    10/23 admitted from [facility] regional for possible need for intervention concerning SVG perforation.  Serum creatinine 5.38 at that time 10/23 through 10/26.  Progressive respiratory failure Portable chest x-ray with cardiomegaly, chronic elevated right hemidiaphragm, and vascular congestion creatinine up to 8.14, potassium 5.3, sodium down to 129 complaining of worsening shortness of breath pulmonary asked to evaluate Consults:  Pulmonary 10/26 Nephrology 10/26  Procedures:  10/24 Limited echocardiogram: EF 60 to 65% with normal left ventricular function normal right ventricular function no pericardial effusion  Significant Diagnostic Tests:    Micro Data:    Antimicrobials:    Interim history/subjective:  Complaining of heartburn/reflux  Objective   Blood pressure 101/78, pulse 82, temperature 97.8 F (36.6 C), temperature source Oral, resp. rate 16, weight 90.7 kg, SpO2 (Abnormal) 89 %.        Intake/Output Summary (Last 24 hours) at 08/09/2019 1108 Last data filed at 08/09/2019 0300 Gross per 24 hour  Intake 925.92 ml  Output 700 ml  Net 225.92 ml   Filed Weights   08/08/19 0622 08/09/19 0546  Weight: 99 kg 90.7 kg    Examination: General: Chronically ill-appearing 78 year old male  he appears older than stated age and is having some increased work of breathing HENT: Notable upper airway wheezing with rhonchorous upper  airway noises. 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  Vascular congestion, cardiomegaly. -Suspect worsening respiratory failure is indeed secondary to cardiorenal syndrome and worsening volume overload.  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  I worry about risk of aspiration particularly with increased work of breathing as well. Plan Transfer to intensive care Continue supplemental oxygen Pulse oximetry Adding PPI Aspiration/reflux precautions He would be agreeable to short-term ventilation to see if dialysis would decrease work of breathing, I think we can hold off from this for now however he is at high risk  Non-ST elevation MI status post PCI/drug-eluting stent to SVG graft of RCA -Most recently complicated by focal perforation of the graft Acute diastolic heart failure Hypertension -Currently being followed by cardiology Plan Continue dual antiplatelet therapy with aspirin and ticagrelor Hydralazine 50 mg every 8 hours, nitrates per cardiology Continuing diuresis however will likely need dialysis Continue telemetry monitoring  Acute on chronic renal failure, underlying CKD stage IV. Suspect secondary to contrast superimposed on underlying CKD Plan Nephrology consulted Anticipate he will require dialysis  Fluid and electrolyte imbalance:  Hyponatremia, hyperkalemia, and borderline nonanion gap/anion gap metabolic acidosis Plan Check arterial blood gas Awaiting nephrology consult  Renal dose medications Plan Continue Lipitor Hyperlipidemia  Diabetes type 2 Plan Sliding scale insulin  Anemia without evidence of bleeding Plan Trend CBC    Best practice:  Diet: NPO Pain/Anxiety/Delirium protocol (if indicated): NA VAP protocol (if indicated): NA DVT prophylaxis: scd GI prophylaxis: PPI Glucose control: SSI Mobility: BR Code Status: full code but does not want prolonged support Family Communication: at bedside Disposition:  Awaiting transfer to ICU where he will need HD Labs   CBC: Recent Labs  Lab 08/06/19 0636 08/06/19 1758 08/07/19 0230 08/08/19 0427 08/09/19 0853  WBC 8.6 6.0 8.8 8.8 8.7  HGB 11.3* 11.0* 10.3* 10.2* 11.3*  HCT 34.4* 33.0* 31.4* 29.8* 34.5*  MCV 97.5 96.8 97.8 96.8 98.0  PLT 124* 121* 134* 122* PENDING    Basic Metabolic Panel: Recent Labs  Lab 08/04/19 2250 08/05/19 0439 08/06/19 1758 08/07/19 0230 08/08/19 0427 08/09/19 0853  NA 136 138  --  136 132* 129*  K 5.2* 4.5  --  4.9 5.2* 5.3*  CL 107 111  --  108 102 101  CO2 21* 18*  --  16* 18* 14*  GLUCOSE 336* 210*  --  202* 195* 264*  BUN 60* 58*  --  53* 55* 75*  CREATININE 5.91* 5.72* 5.38* 5.49* 6.31* 8.14*  CALCIUM 8.3* 8.2*  --  8.2* 8.1* 7.8*   GFR: Estimated Creatinine Clearance: 8.6 mL/min (A) (by C-G formula based on SCr of 8.14 mg/dL (H)). Recent Labs  Lab 08/06/19 1758 08/07/19 0230 08/08/19 0427 08/09/19 0853  WBC 6.0 8.8 8.8 8.7    Liver Function Tests: No results for  input(s): AST, ALT, ALKPHOS, BILITOT, PROT, ALBUMIN in the last 168 hours. No results for input(s): LIPASE, AMYLASE in the last 168 hours. No results for input(s): AMMONIA in the last 168 hours.  ABG No results found for: PHART, PCO2ART, PO2ART, HCO3, TCO2, ACIDBASEDEF, O2SAT   Coagulation Profile: Recent Labs  Lab  08/04/19 2250  INR 1.1    Cardiac Enzymes: No results for input(s): CKTOTAL, CKMB, CKMBINDEX, TROPONINI in the last 168 hours.  HbA1C: Hemoglobin A1C  Date/Time Value Ref Range Status  11/08/2011 02:52 AM 10.2 (H) 4.2 - 6.3 % Final    Comment:    The American Diabetes Association recommends that a primary goal of therapy should be <7% and that physicians should reevaluate the treatment regimen in patients with HbA1c values consistently >8%.    Hgb A1c MFr Bld  Date/Time Value Ref Range Status  08/05/2019 04:39 AM 8.5 (H) 4.8 - 5.6 % Final    Comment:    (NOTE) Pre diabetes:          5.7%-6.4% Diabetes:              >6.4% Glycemic control for   <7.0% adults with diabetes   06/16/2018 9.8 (A) 4.0 - 6.0 Final    CBG: Recent Labs  Lab 08/08/19 0756 08/08/19 1128 08/08/19 1616 08/08/19 2207 08/09/19 0730  GLUCAP 199* 256* 157* 169* 267*    Review of Systems:   Review of Systems  Constitutional: Positive for diaphoresis and malaise/fatigue. Negative for chills, fever and weight loss.  HENT: Negative.   Eyes: Negative.   Respiratory: Positive for shortness of breath.   Cardiovascular: Positive for chest pain and leg swelling.  Gastrointestinal: Positive for heartburn.  Genitourinary: Negative.   Musculoskeletal: Negative.   Skin: Negative.   Neurological: Negative.   Endo/Heme/Allergies: Bruises/bleeds easily.  Psychiatric/Behavioral: Negative.      Past Medical History  He,  has a past medical history of Blood transfusion without reported diagnosis, Cancer ([place]) (2001), CAP (community acquired pneumonia) (08/11/2017), Carotid stenosis, symptomatic, with infarction ([place]) (07/17/2017), Chronic kidney disease (29/9371), Complication of anesthesia, Coronary artery disease, Diabetes mellitus without complication ([place]), Dysrhythmia, Hyperlipidemia, Hypertension, Hypothyroidism, Kidney mass (2019), Myocardial infarction ([place]) (1997), Peripheral vascular disease ([place]),  Pneumonia (2014, 2018), Stroke [facility]) (07/2017), Thyroid disease, Wears dentures, and Weight loss (2019).   Surgical History    Past Surgical History:  Procedure Laterality Date   APPENDECTOMY  2011   [place]   CAROTID ANGIOGRAPHY Right 06/13/2017   Procedure: Right subclavian and Carotid Angiography, possible intervention;  Surgeon: Algernon Huxley, MD;  Location: [place] [facility];  Service: Cardiovascular;  Laterality: Right;   CATARACT EXTRACTION, BILATERAL     CHOLECYSTECTOMY N/A 04/23/2016   had infection post surgery requiring him to debride daily   COLON SURGERY  2011   Colectomy for ileo-cecal valve cancer, also took appendix   COLONOSCOPY WITH PROPOFOL N/A 10/26/2018   Procedure: COLONOSCOPY WITH PROPOFOL;  Surgeon: Lucilla Lame, MD;  Location: [place];  Service: Endoscopy;  Laterality: N/A;  Diabetic - insulin   CORONARY ARTERY BYPASS GRAFT  1997   x 3   CORONARY STENT INTERVENTION N/A 08/06/2019   Procedure: CORONARY STENT INTERVENTION;  Surgeon: Nelva Bush, MD;  Location: [place] [facility];  Service: Cardiovascular;  Laterality: N/A;   CYSTOSCOPY W/ RETROGRADES Bilateral 09/03/2017   Procedure: CYSTOSCOPY WITH RETROGRADE PYELOGRAM;  Surgeon: Hollice Espy, MD;  Location: [facility];  Service: Urology;  Laterality: Bilateral;   CYSTOSCOPY W/ RETROGRADES Right 01/28/2018   Procedure: CYSTOSCOPY WITH RETROGRADE PYELOGRAM;  Surgeon: Hollice Espy, MD;  Location: [facility];  Service: Urology;  Laterality: Right;   CYSTOSCOPY W/ URETERAL STENT REMOVAL  08/2017   CYSTOSCOPY WITH BIOPSY Right 01/28/2018   Procedure: CYSTOSCOPY WITH BIOPSY;  Surgeon: Hollice Espy, MD;  Location: [facility];  Service: Urology;  Laterality: Right;   CYSTOSCOPY WITH STENT PLACEMENT Right 09/03/2017   Procedure: CYSTOSCOPY WITH STENT PLACEMENT;  Surgeon: Hollice Espy, MD;  Location: [facility];  Service: Urology;  Laterality: Right;    CYSTOSCOPY WITH STENT PLACEMENT Right 01/28/2018   Procedure: CYSTOSCOPY WITH STENT PLACEMENT;  Surgeon: Hollice Espy, MD;  Location: [facility];  Service: Urology;  Laterality: Right;   CYSTOSCOPY WITH URETEROSCOPY Right 01/28/2018   Procedure: CYSTOSCOPY WITH URETEROSCOPY;  Surgeon: Hollice Espy, MD;  Location: [facility];  Service: Urology;  Laterality: Right;   ENDARTERECTOMY Right 07/17/2017   Procedure: ENDARTERECTOMY CAROTID;  Surgeon: Algernon Huxley, MD;  Location: [facility];  Service: Vascular;  Laterality: Right;   HERNIA REPAIR  2011   Ventral hernia   HOLMIUM LASER APPLICATION N/A 19/47/1252   Procedure: HOLMIUM LASER APPLICATION;  Surgeon: Hollice Espy, MD;  Location: [facility];  Service: Urology;  Laterality: N/A;   KNEE SURGERY Left    arthroscopy   KYPHOPLASTY N/A 08/14/2017   Procedure: [id number];  Surgeon: Hessie Knows, MD;  Location: [facility];  Service: Orthopedics;  Laterality: N/A;   LARYNX SURGERY     LEFT HEART CATH AND CORS/GRAFTS ANGIOGRAPHY N/A 08/06/2019   Procedure: LEFT HEART CATH AND CORS/GRAFTS ANGIOGRAPHY;  Surgeon: Corey Skains, MD;  Location: [place] [facility];  Service: Cardiovascular;  Laterality: N/A;   POLYPECTOMY  10/26/2018   Procedure: POLYPECTOMY INTESTINAL;  Surgeon: Lucilla Lame, MD;  Location: [place];  Service: Endoscopy;;  Descending colon polyp Transverse colon polyps x 3   PROSTATE SURGERY  2002   BPH benign pathology   SPINE SURGERY  1989   Lumbar disc   THYROPLASTY Right 01/05/2018   Procedure: THYROPLASTY;  Surgeon: Beverly Gust, MD;  Location: [facility];  Service: ENT;  Laterality: Right;     Social History   reports that he quit smoking about 23 years ago. His smoking use included cigarettes. He has a 66.00 pack-year smoking history. He has never used smokeless tobacco. He reports current alcohol use. He reports that he does not use drugs.   Family History   His family history includes  Cancer in his father; Diabetes in his mother; Heart disease in his father and mother.   Allergies Allergies  Allergen Reactions   Ace Inhibitors Other (See Comments)    Reaction:  Raises potassium    Quinapril Rash and Other (See Comments)    hyperkalemia     Home Medications  Prior to Admission medications   Medication Sig Start Date End Date Taking? Authorizing Provider  acetaminophen (TYLENOL) 500 MG tablet Take 1,000 mg every 6 (six) hours as needed by mouth for moderate pain or headache.    [provider]  atenolol (TENORMIN) 50 MG tablet TAKE 1 TABLET BY MOUTH ONCE DAILY Patient taking differently: Take 50 mg by mouth daily.  10/30/18   Glean Hess, MD  atorvastatin (LIPITOR) 40 MG tablet TAKE 1 TABLET BY MOUTH ONCE DAILY AT  6PM Patient taking differently: Take 40 mg by mouth daily at 6 PM.  12/03/18   Glean Hess,  MD  Blood Glucose Monitoring Suppl (FIFTY50 GLUCOSE METER 2.0) w/Device KIT 1 Device by Other route 2 (two) times daily. 12/08/17   [provider]  calcitRIOL (ROCALTROL) 0.5 MCG capsule Take 0.5 mcg by mouth daily. 07/05/19   [provider]  clopidogrel (PLAVIX) 75 MG tablet Take 1 tablet by mouth once daily Patient taking differently: Take 75 mg by mouth daily.  05/19/19   Glean Hess, MD  EQ ASPIRIN ADULT LOW DOSE 81 MG EC tablet TAKE 1  BY MOUTH ONCE DAILY Patient taking differently: Take 81 mg by mouth daily.  11/04/18   Algernon Huxley, MD  glucose blood (RELION PRIME TEST) test strip 1 strip by Other route 2 (two) times daily. 07/09/17   [provider]  heparin 5000 UNIT/ML injection Inject 1 mL (5,000 Units total) into the skin every 8 (eight) hours. 08/07/19   Awilda Bill, NP  hydrALAZINE (APRESOLINE) 20 MG/ML injection Inject 0.5 mLs (10 mg total) into the vein every 20 (twenty) minutes as needed (high blood pressure). 08/06/19   Awilda Bill, NP  insulin NPH Human (HUMULIN N,NOVOLIN N) 100 UNIT/ML  injection Inject 0.25 mLs (25 Units total) into the skin 2 (two) times daily. Patient taking differently: Inject 30 Units into the skin 2 (two) times daily.  08/15/17   Vaughan Basta, MD  insulin regular (NOVOLIN R,HUMULIN R) 100 units/mL injection Inject 12 Units into the skin 2 (two) times daily.     [provider]  levothyroxine (SYNTHROID, LEVOTHROID) 300 MCG tablet Take 300 mcg by mouth daily before breakfast.     [provider]  nitroGLYCERIN 0.2 mg/mL infusion Inject 0-200 mcg/min into the vein continuous. 08/06/19   Awilda Bill, NP  promethazine (PHENERGAN) 25 MG/ML injection Inject 0.25 mLs (6.25 mg total) into the vein every 6 (six) hours as needed for nausea or vomiting. 08/06/19   Awilda Bill, NP  sodium bicarbonate 650 MG tablet Take 650 mg by mouth 2 (two) times daily. 06/19/18   [provider]  Sodium Bicarbonate-Dextrose (SODIUM BICARBONATE 150 MEQ IN DEXTROSE 5% 1000 ML) 150-5 MEQ/L-% Inject 1,000 mLs (150 mEq total) into the vein continuous. 08/06/19   Awilda Bill, NP  sodium chloride 0.9 % infusion Inject 250 mLs into the vein as needed (for IV line care(Saline / Heparin Lock)). 08/06/19   Awilda Bill, NP  sodium chloride flush (NS) 0.9 % SOLN Inject 3 mLs into the vein every 12 (twelve) hours. 08/06/19   Awilda Bill, NP  sodium chloride flush (NS) 0.9 % SOLN Inject 3 mLs into the vein as needed. 08/06/19   Awilda Bill, NP  ticagrelor (BRILINTA) 90 MG TABS tablet Take 1 tablet (90 mg total) by mouth 2 (two) times daily. 08/07/19   Awilda Bill, NP  valACYclovir (VALTREX) 1000 MG tablet Take 1 tablet (1,000 mg total) by mouth 3 (three) times daily. 10/16/18   Glean Hess, MD     Critical care time:  33 minutes       Erick Colace ACNP-BC [facility] Pager # [phone] OR # [phone] if no answer

## 2019-08-09 NOTE — Progress Notes (Signed)
Notified Pecolia Ades PA about pt BP 75/45, HR 80's. Stated to let CCM know. Paged Marni Griffon NP. Carroll Kinds RN

## 2019-08-09 NOTE — Progress Notes (Addendum)
Progress Note  Patient Name: [name] Date of Encounter: 08/09/2019  Primary Cardiologist: Dr. Nehemiah Massed  Subjective   Pt says he can't stop coughing, has shortness of breath, no chest pain. +JVD. CXR being done at this time.   Inpatient Medications    Scheduled Meds: . aspirin EC  81 mg Oral Daily  . atorvastatin  80 mg Oral q1800  . carvedilol  12.5 mg Oral BID WC  . Chlorhexidine Gluconate Cloth  6 each Topical Daily  . hydrALAZINE  50 mg Oral Q8H  . insulin aspart  0-15 Units Subcutaneous TID WC  . insulin aspart  0-5 Units Subcutaneous QHS  . isosorbide mononitrate  30 mg Oral Daily  . ticagrelor  90 mg Oral BID   Continuous Infusions:  PRN Meds: acetaminophen, albuterol, guaiFENesin, nitroGLYCERIN, ondansetron (ZOFRAN) IV   Vital Signs    Vitals:   08/08/19 2344 08/09/19 0403 08/09/19 0546 08/09/19 0645  BP: (!) 139/59  101/66 108/79  Pulse: (!) 111  68   Resp: 16  16   Temp:   97.8 F (36.6 C)   TempSrc:   Oral   SpO2: 93% 95% 95%   Weight:   90.7 kg     Intake/Output Summary (Last 24 hours) at 08/09/2019 0748 Last data filed at 08/09/2019 0300 Gross per 24 hour  Intake 1225.92 ml  Output 900 ml  Net 325.92 ml   Last 3 Weights 08/09/2019 08/08/2019 08/06/2019  Weight (lbs) 199 lb 14.4 oz 218 lb 3.2 oz 215 lb 6.2 oz  Weight (kg) 90.674 kg 98.975 kg 97.7 kg      Telemetry    Sinus rhythm with increased PVCs this am, rates 70's-80's - Personally Reviewed  ECG    No new tracing - Personally Reviewed  Physical Exam   GEN: No acute distress.   Neck: + JVD Cardiac: RRR, no murmurs, rubs, or gallops.  Respiratory: Rhonchi and coarse crackles throughout.  GI: Soft, nontender, non-distended  MS: No edema; No deformity. Neuro:  Nonfocal  Psych: Normal affect   Labs    High Sensitivity Troponin:   Recent Labs  Lab 08/04/19 2250 08/05/19 0043 08/05/19 0818 08/05/19 1624  TROPONINIHS 471* 507* 1,266* 1,123*      Chemistry Recent  Labs  Lab 08/05/19 0439 08/06/19 1758 08/07/19 0230 08/08/19 0427  NA 138  --  136 132*  K 4.5  --  4.9 5.2*  CL 111  --  108 102  CO2 18*  --  16* 18*  GLUCOSE 210*  --  202* 195*  BUN 58*  --  53* 55*  CREATININE 5.72* 5.38* 5.49* 6.31*  CALCIUM 8.2*  --  8.2* 8.1*  GFRNONAA 9* 9* 9* 8*  GFRAA 10* 11* 11* 9*  ANIONGAP 9  --  12 12     Hematology Recent Labs  Lab 08/06/19 1758 08/07/19 0230 08/08/19 0427  WBC 6.0 8.8 8.8  RBC 3.41* 3.21* 3.08*  HGB 11.0* 10.3* 10.2*  HCT 33.0* 31.4* 29.8*  MCV 96.8 97.8 96.8  MCH 32.3 32.1 33.1  MCHC 33.3 32.8 34.2  RDW 16.1* 15.9* 15.9*  PLT 121* 134* 122*    BNPNo results for input(s): BNP, PROBNP in the last 168 hours.   DDimer No results for input(s): DDIMER in the last 168 hours.   Radiology    No results found.  Cardiac Studies   Cardiac Catheterization and Percutaneous Coronary Intervention 10.23.2020  Conclusions: 1. See diagnostic angiogram by Dr. Nehemiah Massed  for details of coronary/graft anatomy. 2. Severe disease involving SVG-RCA with 80-90% stenosis. 3. Successful PCI to SVG-RCA using Resolute Onyx 4.0 x 38 mm drug-eluting stent with 10% residual stenosis and TIMI-3 flow. 4. Focal perforation of the SVG-RCA during stent deployment. This was successfully contained with internal tamponade.  Recommendations: 1. Dual antiplatelet therapy with aspirin and ticagrelor for at least 12 months. 2. Aggressive secondary prevention. 3. Transfer to [facility] for ICU monitoring, given focal perforation of SVG-RCA. If the patient were to become hemodynamically unstable or develop worsening anemia, repeat angiography of SVG-RCA and possible covered stent placement would need to be considered. _____________   2D Echocardiogram 10.24.2020  IMPRESSIONS  1. Left ventricular ejection fraction, by visual estimation, is 60 to 65%. The left ventricle has normal function. Normal left ventricular size. There is no left  ventricular hypertrophy. 2. Global right ventricle has normal systolic function.The right ventricular size is normal. No increase in right ventricular wall thickness. 3. Left atrial size was normal. 4. Right atrial size was norma 5. Severe mitral annular calcification. Mild calcification of the anterior mitral valve leaflet(s).. No evidence of mitral valve regurgitation. Mild mitral stenosis. 6. The tricuspid valve is normal in structure. Tricuspid valve regurgitation is mild. 7. The aortic valve is tricuspid Aortic valve regurgitation is mild by color flow Doppler. Moderate aortic valve sclerosis/calcification without any evidence of aortic stenosis. 8. The pulmonic valve was normal in structure. Pulmonic valve regurgitation is not visualized by color flow Doppler. 9. Normal pulmonary artery systolic pressure. 10. The inferior vena cava is normal in size with greater than 50% respiratory variability, suggesting right atrial pressure of 3 mmHg. 11. No evidence of pericardial effusion. _____________   Patient Profile     78 y.o. male w/ a h/o CAD s/p CABG x 3 in 1997, carotid dzs s/p CEA x 2, DMII, HTN, HL, CKD V, and prior CVA, who was tx from [facility] following nstemi and PCI of the VG  RCA w/ perforation of the graft.   Assessment & Plan    NSTEMI -S/P PCI/DES to SVG-RCA on 62/83/15 complicated by focal perforation of the graft. Transferred to [facility] for ICU care, now out of ICU.  -Limited echo on 10/24 with normal EF and no effusion.  -Weaned from IV NTG to oral nitrate yesterday.  -Continues on aspirin, statin, Brilinta. Atenolol switched to carvedilol.   Acute diastolic CHF -Pt is now with cough, shortness of breath, wet lungs, JVD. Obtaining CXR. Likely related to volume overload with CKD.  Possibly will need dialysis.  -Hold BB for now  Essential hypertension -BP had been elevated, continues on hydralazine, now on carvedilol and long acting nitrate. BP improved, a  little soft this am.   Hyperlipidemia -LDL 82. Continues on high intensity statin with atorvastatin 80 mg daily.   CKD stage V -Followed by [facility] nephrology as outpatient, Dr. Percell Miller. Dialysis has been discussed but not yet planned.  -Creat bumped up after contrast. 6.31 yesterday (from 5.49). Awaiting labs for today.  -Pt had 900 ml UOP documented for day shift yest, none documented overnight, small amt in bedside bag this am.  -Pt with volume overload. Possible needs dialysis. Will consult nephrology.   DM type II on insulin -A1c 8.5. Continue SSI while in hospital.   For questions or updates, please contact [place] Please consult [email or web address] for contact info under        Signed, Daune Perch, NP  08/09/2019, 7:48 AM

## 2019-08-09 NOTE — Consult Note (Signed)
[facility]    NEPHROLOGY CONSULTATION NOTE  PATIENT ID:  [name], DOB:  06/09/1941  HPI: The patient is a 78 y.o. year old male with a past medical history significant for coronary artery disease with CABG in 1997, diabetes, hypertension, hyperlipidemia, chronic kidney disease stage IV who had a non-ST segment elevated MI and underwent cardiac catheterization and stent placement to SVG complicated by perforation.  He was transferred to [facility] for further care.  He has had progressive renal failure with fluid overload him increasing shortness of breath this morning.  The patient was seen this morning with complaints of fatigue and difficulty breathing.  Renal consultation has been called for consideration of urgent dialysis.  The patient was clearly short of breath, and thus unable to provide any other associated history.   Past Medical History:  Diagnosis Date  . Blood transfusion without reported diagnosis    patient unaware of receiving blood unless it was during surgery and he was not told  . Cancer (HCC) 2001   Colon resection  . CAP (community acquired pneumonia) 08/11/2017  . Carotid stenosis, symptomatic, with infarction (HCC) 07/17/2017  . Chronic kidney disease 11/2017   stage IV CKD per nephrologist.  . Complication of anesthesia    raspy voice since carotid endarterectomy 07/17/17. paralysis of vocal chords  . Coronary artery disease   . Diabetes mellitus without complication (HCC)   . Dysrhythmia    patient unaware of any irregular heart rhythms  . Hyperlipidemia   . Hypertension   . Hypothyroidism   . Kidney mass 2019  . Myocardial infarction (HCC) 1997  . Peripheral vascular disease (HCC)   . Pneumonia 2014, 2018   developed after surgery 2018  . Stroke (HCC) 07/2017   mild stroke and then had carotid endarterectomy  . Thyroid disease   . Wears dentures    full upper  . Weight loss 2019   patient has lost over 60 pounds since 07/2017.      Past Surgical History:  Procedure Laterality Date  . APPENDECTOMY  2011  . BACK SURGERY    . CARDIAC CATHETERIZATION  1997  . CAROTID ANGIOGRAPHY Right 06/13/2017   Procedure: Right subclavian and Carotid Angiography, possible intervention;  Surgeon: Dew, Jason S, MD;  Location: [facility];  Service: Cardiovascular;  Laterality: Right;  . CATARACT EXTRACTION, BILATERAL    . CHOLECYSTECTOMY N/A 04/23/2016   had infection post surgery requiring him to debride daily  . 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 COLONOSCOPY WITH PROPOFOL N/A 10/26/2018   Procedure: COLONOSCOPY WITH PROPOFOL;  Surgeon: Wohl, Darren, MD;  Location: [facility];  Service: Endoscopy;  Laterality: N/A;  Diabetic - insulin  . CORONARY ARTERY BYPASS GRAFT  1997   x 3  . CORONARY STENT INTERVENTION N/A 08/06/2019   Procedure: CORONARY STENT INTERVENTION;  Surgeon: End, Christopher, MD;  Location: [facility];  Service: Cardiovascular;  Laterality: N/A;  . CYSTOSCOPY W/ RETROGRADES Bilateral 09/03/2017   Procedure: CYSTOSCOPY WITH RETROGRADE PYELOGRAM;  Surgeon: Brandon, Ashley, MD;  Location: [facility];  Service: Urology;  Laterality: Bilateral;  . CYSTOSCOPY W/ RETROGRADES Right 01/28/2018   Procedure: CYSTOSCOPY WITH RETROGRADE PYELOGRAM;  Surgeon: Brandon, Ashley, MD;  Location: [facility];  Service: Urology;  Laterality: Right;  . CYSTOSCOPY W/ URETERAL STENT REMOVAL  08/2017  . CYSTOSCOPY WITH BIOPSY Right 01/28/2018   Procedure: CYSTOSCOPY WITH BIOPSY;  Surgeon: Brandon, Ashley,   MD;  Location: [facility];  Service: Urology;  Laterality: Right;  . CYSTOSCOPY WITH STENT PLACEMENT Right 09/03/2017   Procedure: CYSTOSCOPY WITH STENT PLACEMENT;  Surgeon: Brandon, Ashley, MD;  Location: [facility];  Service: Urology;  Laterality: Right;  . CYSTOSCOPY WITH STENT PLACEMENT Right 01/28/2018   Procedure: CYSTOSCOPY WITH STENT PLACEMENT;  Surgeon: Brandon,  Ashley, MD;  Location: [facility];  Service: Urology;  Laterality: Right;  . CYSTOSCOPY WITH URETEROSCOPY Right 01/28/2018   Procedure: CYSTOSCOPY WITH URETEROSCOPY;  Surgeon: Brandon, Ashley, MD;  Location: [facility];  Service: Urology;  Laterality: Right;  . ENDARTERECTOMY Right 07/17/2017   Procedure: ENDARTERECTOMY CAROTID;  Surgeon: Dew, Jason S, MD;  Location: [facility];  Service: Vascular;  Laterality: Right;  . HERNIA REPAIR  2011   Ventral hernia  . HOLMIUM LASER APPLICATION N/A 09/03/2017   Procedure: HOLMIUM LASER APPLICATION;  Surgeon: Brandon, Ashley, MD;  Location: [facility];  Service: Urology;  Laterality: N/A;  . KNEE SURGERY Left    arthroscopy  . KYPHOPLASTY N/A 08/14/2017   Procedure: KYPHOPLASTY-T12;  Surgeon: Menz, Michael, MD;  Location: [facility];  Service: Orthopedics;  Laterality: N/A;  . LARYNX SURGERY    . LEFT HEART CATH AND CORS/GRAFTS ANGIOGRAPHY N/A 08/06/2019   Procedure: LEFT HEART CATH AND CORS/GRAFTS ANGIOGRAPHY;  Surgeon: Kowalski, Bruce J, MD;  Location: [facility];  Service: Cardiovascular;  Laterality: N/A;  . POLYPECTOMY  10/26/2018   Procedure: POLYPECTOMY INTESTINAL;  Surgeon: Wohl, Darren, MD;  Location: [facility];  Service: Endoscopy;;  Descending colon polyp Transverse colon polyps x 3  . PROSTATE SURGERY  2002   BPH benign pathology  . SPINE SURGERY  1989   Lumbar disc  . THYROPLASTY Right 01/05/2018   Procedure: THYROPLASTY;  Surgeon: McQueen, Chapman, MD;  Location: [facility];  Service: ENT;  Laterality: Right;    Family History  Problem Relation Age of Onset  . Diabetes Mother   . Heart disease Mother   . Heart disease Father   . Cancer Father        lung    Social History   Tobacco Use  . Smoking status: Former Smoker    Packs/day: 1.50    Years: 44.00    Pack years: 66.00    Types: Cigarettes    Quit date: 06/24/1996    Years since quitting: 23.1  . Smokeless tobacco: Never Used  . Tobacco comment: smoking  cessation materials not required  Substance Use Topics  . Alcohol use: Yes    Alcohol/week: 0.0 standard drinks    Comment: rare  . Drug use: No    REVIEW OF SYSTEMS: General: Positive fatigue Head:  no headaches Eyes:  no blurred vision ENT:  no sore throat Neck:  no masses CV:  no chest pain, no orthopnea Lungs: Positive shortness of breath, positive cough  GI:  no nausea or vomiting, no diarrhea GU:  no dysuria or hematuria Skin:  no rashes or lesions Neuro:  no focal numbness or weakness Psych:  no depression or anxiety    PHYSICAL EXAM:  Vitals:   08/09/19 0812 08/09/19 1144  BP: 101/78 (!) 74/56  Pulse: 82 80  Resp:  (!) 22  Temp:  98.1 F (36.7 C)  SpO2: (!) 89% 94%   I/O last 3 completed shifts: In: 1306.7 [P.O.:1140; I.V.:166.7] Out: 975 [Urine:975]   General: Awake, in moderate distress  HEENT: MMM [place] AT anicteric sclera Neck: Positive JVD, no adenopathy CV:  Heart RRR    Lungs: Audible rails bilaterally Abd:  abd SNT/ND with normal BS GU:  Bladder non-palpable Extremities: +2 bilateral LE edema. Skin:  No skin rash Psych:  normal mood and affect Neuro:  no focal deficits   CURRENT MEDICATIONS:  . aspirin EC  81 mg Oral Daily  . atorvastatin  80 mg Oral q1800  . carvedilol  12.5 mg Oral BID WC  . Chlorhexidine Gluconate Cloth  6 each Topical Daily  . fentaNYL      . hydrALAZINE  50 mg Oral Q8H  . insulin aspart  0-15 Units Subcutaneous TID WC  . insulin aspart  0-5 Units Subcutaneous QHS  . isosorbide mononitrate  30 mg Oral Daily  . midazolam      . pantoprazole (PROTONIX) IV  40 mg Intravenous Q12H  . ticagrelor  90 mg Oral BID     HOME MEDICATIONS:  Prior to Admission medications   Medication Sig Start Date End Date Taking? Authorizing Provider  acetaminophen (TYLENOL) 500 MG tablet Take 1,000 mg every 6 (six) hours as needed by mouth for moderate pain or headache.    [provider]  atenolol (TENORMIN) 50 MG tablet TAKE 1  TABLET BY MOUTH ONCE DAILY Patient taking differently: Take 50 mg by mouth daily.  10/30/18   Glean Hess, MD  atorvastatin (LIPITOR) 40 MG tablet TAKE 1 TABLET BY MOUTH ONCE DAILY AT  6PM Patient taking differently: Take 40 mg by mouth daily at 6 PM.  12/03/18   Glean Hess, MD  Blood Glucose Monitoring Suppl (FIFTY50 GLUCOSE METER 2.0) w/Device KIT 1 Device by Other route 2 (two) times daily. 12/08/17   [provider]  calcitRIOL (ROCALTROL) 0.5 MCG capsule Take 0.5 mcg by mouth daily. 07/05/19   [provider]  clopidogrel (PLAVIX) 75 MG tablet Take 1 tablet by mouth once daily Patient taking differently: Take 75 mg by mouth daily.  05/19/19   Glean Hess, MD  EQ ASPIRIN ADULT LOW DOSE 81 MG EC tablet TAKE 1  BY MOUTH ONCE DAILY Patient taking differently: Take 81 mg by mouth daily.  11/04/18   Algernon Huxley, MD  glucose blood (RELION PRIME TEST) test strip 1 strip by Other route 2 (two) times daily. 07/09/17   [provider]  heparin 5000 UNIT/ML injection Inject 1 mL (5,000 Units total) into the skin every 8 (eight) hours. 08/07/19   Awilda Bill, NP  hydrALAZINE (APRESOLINE) 20 MG/ML injection Inject 0.5 mLs (10 mg total) into the vein every 20 (twenty) minutes as needed (high blood pressure). 08/06/19   Awilda Bill, NP  insulin NPH Human (HUMULIN N,NOVOLIN N) 100 UNIT/ML injection Inject 0.25 mLs (25 Units total) into the skin 2 (two) times daily. Patient taking differently: Inject 30 Units into the skin 2 (two) times daily.  08/15/17   Vaughan Basta, MD  insulin regular (NOVOLIN R,HUMULIN R) 100 units/mL injection Inject 12 Units into the skin 2 (two) times daily.     [provider]  levothyroxine (SYNTHROID, LEVOTHROID) 300 MCG tablet Take 300 mcg by mouth daily before breakfast.     [provider]  nitroGLYCERIN 0.2 mg/mL infusion Inject 0-200 mcg/min into the vein continuous. 08/06/19   Awilda Bill, NP   promethazine (PHENERGAN) 25 MG/ML injection Inject 0.25 mLs (6.25 mg total) into the vein every 6 (six) hours as needed for nausea or vomiting. 08/06/19   Awilda Bill, NP  sodium bicarbonate 650 MG tablet Take 650 mg  by mouth 2 (two) times daily. 06/19/18   [provider]  Sodium Bicarbonate-Dextrose (SODIUM BICARBONATE 150 MEQ IN DEXTROSE 5% 1000 ML) 150-5 MEQ/L-% Inject 1,000 mLs (150 mEq total) into the vein continuous. 08/06/19   Awilda Bill, NP  sodium chloride 0.9 % infusion Inject 250 mLs into the vein as needed (for IV line care(Saline / Heparin Lock)). 08/06/19   Awilda Bill, NP  sodium chloride flush (NS) 0.9 % SOLN Inject 3 mLs into the vein every 12 (twelve) hours. 08/06/19   Awilda Bill, NP  sodium chloride flush (NS) 0.9 % SOLN Inject 3 mLs into the vein as needed. 08/06/19   Awilda Bill, NP  ticagrelor (BRILINTA) 90 MG TABS tablet Take 1 tablet (90 mg total) by mouth 2 (two) times daily. 08/07/19   Awilda Bill, NP  valACYclovir (VALTREX) 1000 MG tablet Take 1 tablet (1,000 mg total) by mouth 3 (three) times daily. 10/16/18   Glean Hess, MD       LABS:  CBC Latest Ref Rng & Units 08/09/2019 08/08/2019 08/07/2019  WBC 4.0 - 10.5 K/uL 8.7 8.8 8.8  Hemoglobin 13.0 - 17.0 g/dL 11.3(L) 10.2(L) 10.3(L)  Hematocrit 39.0 - 52.0 % 34.5(L) 29.8(L) 31.4(L)  Platelets 150 - 400 K/uL 117(L) 122(L) 134(L)    CMP Latest Ref Rng & Units 08/09/2019 08/08/2019 08/07/2019  Glucose 70 - 99 mg/dL 264(H) 195(H) 202(H)  BUN 8 - 23 mg/dL 75(H) 55(H) 53(H)  Creatinine 0.61 - 1.24 mg/dL 8.14(H) 6.31(H) 5.49(H)  Sodium 135 - 145 mmol/L 129(L) 132(L) 136  Potassium 3.5 - 5.1 mmol/L 5.3(H) 5.2(H) 4.9  Chloride 98 - 111 mmol/L 101 102 108  CO2 22 - 32 mmol/L 14(L) 18(L) 16(L)  Calcium 8.9 - 10.3 mg/dL 7.8(L) 8.1(L) 8.2(L)  Total Protein 6.5 - 8.1 g/dL - - -  Total Bilirubin 0.3 - 1.2 mg/dL - - -  Alkaline Phos 38 - 126 U/L - - -  AST 15 - 41 U/L - - -   ALT 0 - 44 U/L - - -    Lab Results  Component Value Date   CALCIUM 7.8 (L) 08/09/2019       Component Value Date/Time   COLORURINE RED (A) 11/13/2017 2022   APPEARANCEUR Clear 03/31/2018 1609   LABSPEC 1.015 11/13/2017 2022   PHURINE  11/13/2017 2022    TEST NOT REPORTED DUE TO COLOR INTERFERENCE OF URINE PIGMENT   GLUCOSEU 2+ (A) 03/31/2018 1609   HGBUR (A) 11/13/2017 2022    TEST NOT REPORTED DUE TO COLOR INTERFERENCE OF URINE PIGMENT   BILIRUBINUR Negative 03/31/2018 1609   KETONESUR (A) 11/13/2017 2022    TEST NOT REPORTED DUE TO COLOR INTERFERENCE OF URINE PIGMENT   PROTEINUR 3+ (A) 03/31/2018 1609   PROTEINUR (A) 11/13/2017 2022    TEST NOT REPORTED DUE TO COLOR INTERFERENCE OF URINE PIGMENT   NITRITE Negative 03/31/2018 1609   NITRITE (A) 11/13/2017 2022    TEST NOT REPORTED DUE TO COLOR INTERFERENCE OF URINE PIGMENT   LEUKOCYTESUR Negative 03/31/2018 1609   No results found for: PHART, PCO2ART, PO2ART, HCO3, TCO2, ACIDBASEDEF, O2SAT  No results found for: IRON, TIBC, FERRITIN, IRONPCTSAT     ASSESSMENT/PLAN:     1.  Chronic kidney disease stage IV-V.  His baseline serum creatinine runs around 3.9 from November 2019.  I suspect this is on the basis of underlying vascular disease.  No recent abdominal imaging.  Check urinalysis for completeness.  2.  Acute kidney injury.  Likely related to hemodynamics and contrast-induced nephropathy.  Marked worsening of renal function.  Question component of thromboembolic disease.  Regardless of the cause, urgent dialysis is indicated.  This was discussed with patient earlier this morning.  Given patient's significant shortness of breath, I think it would be most prudent to put in the dialysis catheter in a setting where his breathing could be closely monitored.  We will plan for dialysis afterwards.  If the dialysis nurses not immediately available, we can begin with CRRT.  3.  Acute congestive heart failure.  Most recent  echocardiogram showed relatively normal ejection fraction.  Appears markedly fluid overloaded on examination.  Cardiology following.  Needs ultrafiltration on hemodialysis.  4.  Diabetes mellitus.  Insulin per primary team.  5.  Acute hypoxemic respiratory failure.  Will likely need intubation soon.  Appreciate assistance of critical care medicine.  [place], DO, [place]

## 2019-08-09 NOTE — Care Management Important Message (Signed)
Important Message  Patient Details  Name: [name] MRN: [id number] Date of Birth: 1941/02/16   Medicare Important Message Given:  Yes     Shelda Altes 08/09/2019, 2:01 PM

## 2019-08-10 ENCOUNTER — Encounter (INDEPENDENT_AMBULATORY_CARE_PROVIDER_SITE_OTHER): Payer: Medicare HMO | Admitting: Ophthalmology

## 2019-08-10 ENCOUNTER — Inpatient Hospital Stay (HOSPITAL_COMMUNITY): Payer: Medicare HMO

## 2019-08-10 DIAGNOSIS — J9601 Acute respiratory failure with hypoxia: Secondary | ICD-10-CM

## 2019-08-10 LAB — RENAL FUNCTION PANEL
Albumin: 2.2 g/dL — ABNORMAL LOW (ref 3.5–5.0)
Albumin: 2.1 g/dL — ABNORMAL LOW (ref 3.5–5.0)
Anion gap: 14 (ref 5–15)
Anion gap: 15 (ref 5–15)
BUN: 60 mg/dL — ABNORMAL HIGH (ref 8–23)
BUN: 48 mg/dL — ABNORMAL HIGH (ref 8–23)
CO2: 18 mmol/L — ABNORMAL LOW (ref 22–32)
CO2: 17 mmol/L — ABNORMAL LOW (ref 22–32)
Calcium: 7.9 mg/dL — ABNORMAL LOW (ref 8.9–10.3)
Calcium: 7.9 mg/dL — ABNORMAL LOW (ref 8.9–10.3)
Chloride: 101 mmol/L (ref 98–111)
Chloride: 102 mmol/L (ref 98–111)
Creatinine, Ser: 6.1 mg/dL — ABNORMAL HIGH (ref 0.61–1.24)
Creatinine, Ser: 4.54 mg/dL — ABNORMAL HIGH (ref 0.61–1.24)
GFR calc Af Amer: 9 mL/min — ABNORMAL LOW (ref >60)
GFR calc Af Amer: 13 mL/min — ABNORMAL LOW (ref >60)
GFR calc non Af Amer: 8 mL/min — ABNORMAL LOW (ref >60)
GFR calc non Af Amer: 11 mL/min — ABNORMAL LOW (ref >60)
Glucose, Bld: 182 mg/dL — ABNORMAL HIGH (ref 70–99)
Glucose, Bld: 188 mg/dL — ABNORMAL HIGH (ref 70–99)
Phosphorus: 5 mg/dL — ABNORMAL HIGH (ref 2.5–4.6)
Phosphorus: 5.9 mg/dL — ABNORMAL HIGH (ref 2.5–4.6)
Potassium: 4.5 mmol/L (ref 3.5–5.1)
Potassium: 4.4 mmol/L (ref 3.5–5.1)
Sodium: 133 mmol/L — ABNORMAL LOW (ref 135–145)
Sodium: 134 mmol/L — ABNORMAL LOW (ref 135–145)

## 2019-08-10 LAB — CBC
HCT: 35.8 % — ABNORMAL LOW (ref 39.0–52.0)
Hemoglobin: 11.9 g/dL — ABNORMAL LOW (ref 13.0–17.0)
MCH: 32.8 pg (ref 26.0–34.0)
MCHC: 33.2 g/dL (ref 30.0–36.0)
MCV: 98.6 fL (ref 80.0–100.0)
Platelets: 174 10*3/uL (ref 150–400)
RBC: 3.63 MIL/uL — ABNORMAL LOW (ref 4.22–5.81)
RDW: 16.2 % — ABNORMAL HIGH (ref 11.5–15.5)
WBC: 21.1 10*3/uL — ABNORMAL HIGH (ref 4.0–10.5)
nRBC: 0.2 % (ref 0.0–0.2)

## 2019-08-10 LAB — POCT ACTIVATED CLOTTING TIME
Activated Clotting Time: 120 seconds
Activated Clotting Time: 142 seconds
Activated Clotting Time: 158 seconds
Activated Clotting Time: 164 seconds
Activated Clotting Time: 169 seconds
Activated Clotting Time: 175 seconds
Activated Clotting Time: 153 seconds
Activated Clotting Time: 153 seconds
Activated Clotting Time: 158 seconds
Activated Clotting Time: 169 seconds
Activated Clotting Time: 164 seconds
Activated Clotting Time: 169 seconds
Activated Clotting Time: 169 seconds
Activated Clotting Time: 175 seconds
Activated Clotting Time: 180 seconds
Activated Clotting Time: 241 seconds

## 2019-08-10 LAB — CBC WITH DIFFERENTIAL/PLATELET
Abs Immature Granulocytes: 0 10*3/uL (ref 0.00–0.07)
Basophils Absolute: 0 10*3/uL (ref 0.0–0.1)
Basophils Relative: 0 %
Eosinophils Absolute: 0 10*3/uL (ref 0.0–0.5)
Eosinophils Relative: 0 %
HCT: 36.5 % — ABNORMAL LOW (ref 39.0–52.0)
Hemoglobin: 11.9 g/dL — ABNORMAL LOW (ref 13.0–17.0)
Lymphocytes Relative: 4 %
Lymphs Abs: 0.8 10*3/uL (ref 0.7–4.0)
MCH: 32.7 pg (ref 26.0–34.0)
MCHC: 32.6 g/dL (ref 30.0–36.0)
MCV: 100.3 fL — ABNORMAL HIGH (ref 80.0–100.0)
Monocytes Absolute: 0.6 10*3/uL (ref 0.1–1.0)
Monocytes Relative: 3 %
Neutro Abs: 19.3 10*3/uL — ABNORMAL HIGH (ref 1.7–7.7)
Neutrophils Relative %: 93 %
Platelets: 179 10*3/uL (ref 150–400)
RBC: 3.64 MIL/uL — ABNORMAL LOW (ref 4.22–5.81)
RDW: 16.2 % — ABNORMAL HIGH (ref 11.5–15.5)
WBC: 20.7 10*3/uL — ABNORMAL HIGH (ref 4.0–10.5)
nRBC: 0 /100 WBC
nRBC: 0.1 % (ref 0.0–0.2)

## 2019-08-10 LAB — GLUCOSE, CAPILLARY
Glucose-Capillary: 190 mg/dL — ABNORMAL HIGH (ref 70–99)
Glucose-Capillary: 200 mg/dL — ABNORMAL HIGH (ref 70–99)
Glucose-Capillary: 162 mg/dL — ABNORMAL HIGH (ref 70–99)
Glucose-Capillary: 160 mg/dL — ABNORMAL HIGH (ref 70–99)

## 2019-08-10 LAB — APTT: aPTT: 78 seconds — ABNORMAL HIGH (ref 24–36)

## 2019-08-10 LAB — MAGNESIUM: Magnesium: 2 mg/dL (ref 1.7–2.4)

## 2019-08-10 MED ORDER — NOREPINEPHRINE 16 MG/250ML-% IV SOLN
0.0000 ug/min | INTRAVENOUS | Status: DC
  Administered 2019-08-10 (×2): 26 ug/min via INTRAVENOUS
  Administered 2019-08-11: 14 ug/min via INTRAVENOUS
  Filled 2019-08-10 (×2): qty 250

## 2019-08-10 MED ORDER — SODIUM CHLORIDE 0.9 % IV SOLN: INTRAVENOUS | Status: DC | PRN

## 2019-08-10 NOTE — Progress Notes (Signed)
NAME:  [name], MRN:  [id number], DOB:  May 29, 1941, LOS: 4 ADMISSION DATE:  08/06/2019, CONSULTATION DATE:  10/26 REFERRING MD: Hilty  , CHIEF COMPLAINT:  Acute respiratory failure   Brief History   78 year old male patient with prior stage IV CKD and severe underlying coronary artery disease.  Initially admitted to [facility] with non-ST elevation MI.  Underwent cardiac catheterization on 10/23 had small localized perforation of SVG during stent placement, transferred to [facility] because of this.  No further cardiac interventions completed however developed progressive renal failure and associated respiratory failure ultimately requiring critical care and nephrology consult on 10/26.  History of present illness    This is a pleasant 78 year old white male with a significant history of coronary artery disease as mentioned below.  He was admitted to [facility] initially with chief complaint of chest pain, elevated troponin and working diagnosis of non-ST elevation MI.  He went for cardiac catheterization at [facility] regional, catheterization was notable for chronic occlusion of the SVG to OM with severe distal disease of the RCA which was felt to be the culprit lesion.  Intervention was complicated by focal perforation of the SVG during stent placement, contrast extravasation was controlled with balloon tamponade, however contained extravasation with still evident and because of this he was transferred to [facility].  As noted in history has a history of stage IV chronic kidney disease, on 10/24 he was started on bicarbonate drip as well as nitro infusion for increased hypertension.  Urine output was minimal.  Limited echocardiogram was obtained on 10/24 this was negative for pericardial effusion.  He did have recurrent chest pain the a.m. of 10/25 treated with nitroglycerin.  On 10/26 his renal function had continued to worsen.  Creatinine now up to 8.  Volume overloaded with worsening tachypnea,  cough, oxygen requirements.  Chest x-ray showing pulmonary edema.  Critical care asked to evaluate for ICU transfer as well as possible dialysis catheter placement and potential need for escalated pulmonary support  Past Medical History  Coronary artery disease status post CABG x3 vessels in 1997.  Carotid artery disease status post CEA x2, diabetes type 2, hypertension, hyperlipidemia, CKD stage IV, prior CVA.  Most recently non-ST elevation MI with PCI to the venous graft to RCA complicated by perforation of graft.  Significant Hospital Events   10/23 admitted from [facility] regional for possible need for intervention concerning SVG perforation.  Serum creatinine 5.38 at that time  10/23 through 10/26.  Progressive respiratory failure Portable chest x-ray with cardiomegaly, chronic elevated right hemidiaphragm, and vascular congestion creatinine up to 8.14, potassium 5.3, sodium down to 129 complaining of worsening shortness of breath pulmonary asked to evaluate Transfer to the ICU, hemodialysis catheter placed and CRRT initiated  10/27-escalating norepi requirements.  Unable to remove volume with CRRT  Consults:  Pulmonary 10/26 Nephrology 10/26  Procedures:  10/24 Limited echocardiogram: EF 60 to 65% with normal left ventricular function normal right ventricular function no pericardial effusion  Significant Diagnostic Tests:    Micro Data:    Antimicrobials:    Interim history/subjective:    Objective   Blood pressure (!) 75/55, pulse (!) 54, temperature (!) 96.6 F (35.9 C), temperature source Oral, resp. rate 19, height 6\' 2"  (1.88 m), weight 90.4 kg, SpO2 96 %.        Intake/Output Summary (Last 24 hours) at 08/10/2019 1038 Last data filed at 08/10/2019 1000 Gross per 24 hour  Intake 687.64 ml  Output 2013 ml  Net -1325.36 ml   Filed Weights   08/08/19 0622 08/09/19 0546 08/09/19 1712  Weight: 99 kg 90.7 kg 90.4 kg    Examination: Gen:      Chronically  ill-appearing HEENT:  EOMI, sclera anicteric Neck:     No masses; no thyromegaly Lungs:    Bibasal crackles. CV:         Regular rate and rhythm; no murmurs Abd:      + bowel sounds; soft, non-tender; no palpable masses, no distension Ext:    No edema; adequate peripheral perfusion Skin:      Warm and dry; no rash Neuro: Somnolent, confused.  Resolved Hospital Problem list     Assessment & Plan:  Acute hypoxic respiratory failure in setting of pulmonary edema and volume overload, chronically elevated right hemidiaphragm  history of vocal cord paralysis since 2019 after carotid endarterectomy Plan Continue monitoring in ICU High risk for needing intubation Follow chest x-ray, check ABG Continue supplemental oxygen  Non-ST elevation MI status post PCI/drug-eluting stent to SVG graft of RCA -Most recently complicated by focal perforation of the graft Acute diastolic heart failure Hypertension -Currently being followed by cardiology Plan Continue dual antiplatelet therapy with aspirin and ticagrelor Holding blood pressure medications due to hypotension Continue Levophed We will ask respiratory to place a line   Acute on chronic renal failure, underlying CKD stage IV. Suspect secondary to contrast superimposed on underlying CKD Plan On CRRT.  Diabetes type 2 Plan Sliding scale insulin  Anemia without evidence of bleeding Plan Follow CBC.  Best practice:  Diet: NPO Pain/Anxiety/Delirium protocol (if indicated): NA VAP protocol (if indicated): NA DVT prophylaxis: scd GI prophylaxis: PPI Glucose control: SSI Mobility: BR Code Status: full code but does not want prolonged support Family Communication: per primary Disposition: ICU  Labs   CBC: Recent Labs  Lab 08/06/19 0636 08/06/19 1758 08/07/19 0230 08/08/19 0427 08/09/19 0853  WBC 8.6 6.0 8.8 8.8 8.7  HGB 11.3* 11.0* 10.3* 10.2* 11.3*  HCT 34.4* 33.0* 31.4* 29.8* 34.5*  MCV 97.5 96.8 97.8 96.8 98.0   PLT 124* 121* 134* 122* 117*    Basic Metabolic Panel: Recent Labs  Lab 08/07/19 0230 08/08/19 0427 08/09/19 0853 08/09/19 1618 08/10/19 0247  NA 136 132* 129* 131* 133*  K 4.9 5.2* 5.3* 5.3* 4.5  CL 108 102 101 101 101  CO2 16* 18* 14* 16* 18*  GLUCOSE 202* 195* 264* 116* 182*  BUN 53* 55* 75* 81* 60*  CREATININE 5.49* 6.31* 8.14* 8.68* 6.10*  CALCIUM 8.2* 8.1* 7.8* 8.0* 7.9*  MG  --   --   --   --  2.0  PHOS  --   --   --  4.9* 5.0*   GFR: Estimated Creatinine Clearance: 11.4 mL/min (A) (by C-G formula based on SCr of 6.1 mg/dL (H)). Recent Labs  Lab 08/06/19 1758 08/07/19 0230 08/08/19 0427 08/09/19 0853  WBC 6.0 8.8 8.8 8.7    Liver Function Tests: Recent Labs  Lab 08/09/19 1618 08/10/19 0247  ALBUMIN 2.4* 2.2*   No results for input(s): LIPASE, AMYLASE in the last 168 hours. No results for input(s): AMMONIA in the last 168 hours.  ABG No results found for: PHART, PCO2ART, PO2ART, HCO3, TCO2, ACIDBASEDEF, O2SAT   Coagulation Profile: Recent Labs  Lab 08/04/19 2250  INR 1.1    Cardiac Enzymes: No results for input(s): CKTOTAL, CKMB, CKMBINDEX, TROPONINI in the last 168 hours.  HbA1C: Hemoglobin A1C  Date/Time Value Ref Range Status  11/08/2011 02:52 AM 10.2 (H) 4.2 - 6.3 % Final    Comment:    The American Diabetes Association recommends that a primary goal of therapy should be <7% and that physicians should reevaluate the treatment regimen in patients with HbA1c values consistently >8%.    Hgb A1c MFr Bld  Date/Time Value Ref Range Status  08/05/2019 04:39 AM 8.5 (H) 4.8 - 5.6 % Final    Comment:    (NOTE) Pre diabetes:          5.7%-6.4% Diabetes:              >6.4% Glycemic control for   <7.0% adults with diabetes   06/16/2018 9.8 (A) 4.0 - 6.0 Final    CBG: Recent Labs  Lab 08/09/19 0730 08/09/19 1137 08/09/19 1616 08/09/19 2159 08/10/19 0646  GLUCAP 267* 214* 111* 154* 190*    The patient is critically ill with  multiple organ system failure and requires high complexity decision making for assessment and support, frequent evaluation and titration of therapies, advanced monitoring, review of radiographic studies and interpretation of complex data.   Critical Care Time devoted to patient care services, exclusive of separately billable procedures, described in this note is 35 minutes.   Marshell Garfinkel MD [facility] Pager [phone] If no answer call [phone] 08/10/2019, 10:39 AM

## 2019-08-10 NOTE — Progress Notes (Signed)
Placed a left radial arterial line after an unsuccessfull attempt on right radial x 2.  Sterile technique and equipment used.  Great waveform and blood return.  Monitor zeroed and leveled.  Cleaned site with chlorehexidine prep, sterile drape, gloves and secured.  Tolerated well.

## 2019-08-10 NOTE — Progress Notes (Signed)
Progress Note  Patient Name: [name] Date of Encounter: 08/10/2019  Primary Cardiologist: Dr. Nehemiah Massed  Subjective  O/N Events: Started dialysis. BUN/Creatinine/Hyperkalemia improving  [name] was examined and evaluated at bedside this AM. He states he has some pain in the back of his neck but otherwise has no complaints. Denies any significant chest pain, palpitations, dyspnea.  Inpatient Medications    Scheduled Meds: . aspirin EC  81 mg Oral Daily  . atorvastatin  80 mg Oral q1800  . Chlorhexidine Gluconate Cloth  6 each Topical Daily  . insulin aspart  0-15 Units Subcutaneous TID WC  . insulin aspart  0-5 Units Subcutaneous QHS  . levothyroxine  300 mcg Oral Q0600  . pantoprazole (PROTONIX) IV  40 mg Intravenous Q12H  . ticagrelor  90 mg Oral BID   Continuous Infusions: .  prismasol BGK 4/2.5 200 mL/hr at 08/09/19 1608  .  prismasol BGK 4/2.5 200 mL/hr at 08/09/19 1601  . furosemide 120 mg (08/10/19 0024)  . heparin 10,000 units/ 20 mL infusion syringe 1,150 Units/hr (08/10/19 0713)  . norepinephrine (LEVOPHED) Adult infusion 10 mcg/min (08/10/19 0624)  . prismasol BGK 2/2.5 dialysis solution 2,000 mL/hr at 08/10/19 0616   PRN Meds: acetaminophen, albuterol, guaiFENesin, heparin, heparin, heparin, heparin, nitroGLYCERIN, ondansetron (ZOFRAN) IV, sodium chloride   Vital Signs    Vitals:   08/10/19 0400 08/10/19 0500 08/10/19 0600 08/10/19 0700  BP: 94/68 (!) 78/60 (!) 81/58 (!) 86/60  Pulse: 62 61 62 62  Resp: 17 15 14 19   Temp: (!) 96.5 F (35.8 C)     TempSrc: Axillary     SpO2: 95% 97% 95% 95%  Weight:      Height:        Intake/Output Summary (Last 24 hours) at 08/10/2019 0727 Last data filed at 08/10/2019 0700 Gross per 24 hour  Intake 584.7 ml  Output 1899 ml  Net -1314.3 ml   Last 3 Weights 08/09/2019 08/09/2019 08/08/2019  Weight (lbs) 199 lb 3.8 oz 199 lb 14.4 oz 218 lb 3.2 oz  Weight (kg) 90.374 kg 90.674 kg 98.975 kg       Telemetry    Normal sinus HR in 60-70s - Personally Reviewed  ECG    No new tracing - Personally Reviewed  Physical Exam   Gen: Well-developed, obese, NAD HEENT: NCAT head, hearing intact, Nasal Cannula in place, EOMi Neck: supple, ROM intact, dialysis catheter in place and functioning CV: RRR, S1, S2 normal, No rubs, no murmurs Pulm: Bibasilar rales, no wheezing Abd: Soft, BS+, NTND Extm: ROM intact, Peripheral pulses intact, 1+ pitting edema bilateral LEs Skin: Dry, Warm, normal turgor  Neuro: AAOx3  Labs    High Sensitivity Troponin:   Recent Labs  Lab 08/04/19 2250 08/05/19 0043 08/05/19 0818 08/05/19 1624  TROPONINIHS 471* 507* 1,266* 1,123*      Chemistry Recent Labs  Lab 08/09/19 0853 08/09/19 1618 08/10/19 0247  NA 129* 131* 133*  K 5.3* 5.3* 4.5  CL 101 101 101  CO2 14* 16* 18*  GLUCOSE 264* 116* 182*  BUN 75* 81* 60*  CREATININE 8.14* 8.68* 6.10*  CALCIUM 7.8* 8.0* 7.9*  ALBUMIN  --  2.4* 2.2*  GFRNONAA 6* 5* 8*  GFRAA 7* 6* 9*  ANIONGAP 14 14 14      Hematology Recent Labs  Lab 08/07/19 0230 08/08/19 0427 08/09/19 0853  WBC 8.8 8.8 8.7  RBC 3.21* 3.08* 3.52*  HGB 10.3* 10.2* 11.3*  HCT 31.4* 29.8* 34.5*  MCV  97.8 96.8 98.0  MCH 32.1 33.1 32.1  MCHC 32.8 34.2 32.8  RDW 15.9* 15.9* 16.0*  PLT 134* 122* 117*    BNPNo results for input(s): BNP, PROBNP in the last 168 hours.   DDimer No results for input(s): DDIMER in the last 168 hours.   Radiology    Dg Chest Port 1 View  Result Date: 08/09/2019 CLINICAL DATA:  Central venous catheter placement, type II diabetes mellitus, hypertension, coronary artery disease post NSTEMI, CABG, and coronary PTCA, stage IV chronic kidney disease, colon cancer EXAM: PORTABLE CHEST 1 VIEW COMPARISON:  Portable exam 1423 hours compared to 0806 hours FINDINGS: RIGHT jugular central venous catheter with tip projecting over proximal SVC. Enlargement of cardiac silhouette post CABG and coronary stenting.  Atherosclerotic calcification aorta. Slight pulmonary vascular congestion. Interstitial infiltrates in the mid to lower lungs favor pulmonary edema. Mild RIGHT basilar atelectasis. No pleural effusion or pneumothorax. Bones unremarkable. IMPRESSION: No pneumothorax following RIGHT jugular line placement. RIGHT basilar atelectasis and probable mild pulmonary edema. Electronically Signed   By: Lavonia Dana M.D.   On: 08/09/2019 14:34   Dg Chest Port 1 View  Result Date: 08/09/2019 CLINICAL DATA:  Cough, wheezing EXAM: PORTABLE CHEST - 1 VIEW COMPARISON:  08/06/2019 FINDINGS: Some interval improvement in the pulmonary vascular congestion seen previously. Persistent elevated right hemidiaphragm with bronchovascular crowding at the lung bases right worse than left. Heart size upper limits normal for technique. Aortic Atherosclerosis (ICD10-170.0). Previous CABG. No effusion. No pneumothorax. Sternotomy wires. IMPRESSION: 1. Improving pulmonary vascular congestion. 2. Persistent elevated right hemidiaphragm. 3. Previous CABG. Electronically Signed   By: Lucrezia Europe M.D.   On: 08/09/2019 08:23    Cardiac Studies   Cardiac Catheterization and Percutaneous Coronary Intervention 10.23.2020  Conclusions: 1. See diagnostic angiogram by Dr. Nehemiah Massed for details of coronary/graft anatomy. 2. Severe disease involving SVG-RCA with 80-90% stenosis. 3. Successful PCI to SVG-RCA using Resolute Onyx 4.0 x 38 mm drug-eluting stent with 10% residual stenosis and TIMI-3 flow. 4. Focal perforation of the SVG-RCA during stent deployment. This was successfully contained with internal tamponade.  Recommendations: 1. Dual antiplatelet therapy with aspirin and ticagrelor for at least 12 months. 2. Aggressive secondary prevention. 3. Transfer to [facility] for ICU monitoring, given focal perforation of SVG-RCA. If the patient were to become hemodynamically unstable or develop worsening anemia, repeat angiography  of SVG-RCA and possible covered stent placement would need to be considered. _____________   2D Echocardiogram 10.24.2020  IMPRESSIONS  1. Left ventricular ejection fraction, by visual estimation, is 60 to 65%. The left ventricle has normal function. Normal left ventricular size. There is no left ventricular hypertrophy. 2. Global right ventricle has normal systolic function.The right ventricular size is normal. No increase in right ventricular wall thickness. 3. Left atrial size was normal. 4. Right atrial size was norma 5. Severe mitral annular calcification. Mild calcification of the anterior mitral valve leaflet(s).. No evidence of mitral valve regurgitation. Mild mitral stenosis. 6. The tricuspid valve is normal in structure. Tricuspid valve regurgitation is mild. 7. The aortic valve is tricuspid Aortic valve regurgitation is mild by color flow Doppler. Moderate aortic valve sclerosis/calcification without any evidence of aortic stenosis. 8. The pulmonic valve was normal in structure. Pulmonic valve regurgitation is not visualized by color flow Doppler. 9. Normal pulmonary artery systolic pressure. 10. The inferior vena cava is normal in size with greater than 50% respiratory variability, suggesting right atrial pressure of 3 mmHg. 11. No evidence of pericardial effusion.  _____________   Patient Profile     78 y.o. male w/ a h/o CAD s/p CABG x 3 in 1997, carotid dzs s/p CEA x 2, DMII, HTN, HL, CKD V, and prior CVA, who was tx from [facility] following nstemi and PCI of the VG  RCA w/ perforation of the graft.  Assessment & Plan    NSTEMI Presented w/ chest pain w/ Elevated HsTrop 507->1266 on admission. S/p PCI/DES to SVG-RCA on 64/15/83 complicated by focal perforation of the graft. Limited echo on 10/24 with normal EF and no effusion. Denies any chest pain this am. - C/w nitrate PRN for chest pain - C/w asa, statin, Brilinta - Telemetry  Acute diastolic CHF Hypervolemic  on exam yesterday w/ oxygen requirement and pulmonary edema. Dyspnea improved per pt overnight after starting dialysis. Carvedilol held due to bradycardia yesterday. - C/w hold BB - Daily weights, I&Os - Diuresed via dialysis   Essential hypertension Am bp 86/60 - Hold home bp meds due to hypotension   Hyperlipidemia LDL 82.  - C/w atorvastatin 80mg  daily  CKD stage V Followed by [facility] nephro. Post-cath, worsening creatinine 6.31->8.14 but now improving down to 6.1 after nephro started dialysis - Dialysis per nephro - Unclear if native renal fx will recover  DM type II on insulin -A1c 8.5. Continue SSI while in hospital.   For questions or updates, please contact [place] Please consult [email or web address] for contact info under     Signed, Mosetta Anis, MD  08/10/2019, 7:27 AM   PGY-2, [place] Pager: [phone]  Attending attestation to follow

## 2019-08-10 NOTE — Progress Notes (Signed)
Inpatient Diabetes Program Recommendations  AACE/ADA: New Consensus Statement on Inpatient Glycemic Control (2015)  Target Ranges:  Prepandial:   less than 140 mg/dL      Peak postprandial:   less than 180 mg/dL (1-2 hours)      Critically ill patients:  140 - 180 mg/dL   Lab Results  Component Value Date   GLUCAP 190 (H) 08/10/2019   HGBA1C 8.5 (H) 08/05/2019    Review of Glycemic Control Results for [name], [name] (MRN [id number]) as of 08/10/2019 11:21  Ref. Range 08/09/2019 21:59 08/10/2019 06:46  Glucose-Capillary Latest Ref Range: 70 - 99 mg/dL 154 (H) 190 (H)   Diabetes history: DM 2 Outpatient Diabetes medications: NPH 25 units bid, Regular insulin 12 units bid Current orders for Inpatient glycemic control:  Novolog moderate tid with meals and HS Inpatient Diabetes Program Recommendations:    May consider adding Levemir 5 units bid.   Thanks,  Adah Perl, RN, BC-ADM Inpatient Diabetes Coordinator Pager [phone] (8a-5p)

## 2019-08-10 NOTE — Progress Notes (Signed)
Assisted tele visit to patient with family member.  Azhane Eckart R, RN  

## 2019-08-10 NOTE — Progress Notes (Signed)
[facility] ROUNDING NOTE   Subjective:   78 year old gentleman with a history of coronary artery status post CABG 1997 diabetes hypertension hyperlipidemia chronic kidney disease stage IV at baseline.  Non-ST segment elevated MI I cardiac catheterization stent complicated by perforation.  Transferred to [facility] 08/09/2019.  Acute on chronic kidney injury Baseline serum creatinine 3.22 August 2018.  Patient initiated on CRRT 08/09/2019  Blood pressure 68/53 pulse 50 temperature 96.6 O2 sats 95% 4 L nasal cannula.  Ultrafiltration 20 to 50 cc an hour.  Sodium 133 potassium 4.5 chloride 101 CO2 18 BUN 60 creatinine 6.1 glucose 182 calcium 7.9 phosphorus 5.0 magnesium 2.0 albumin 2.2 WBC 8.7 hemoglobin 9.3 platelets 117  Aspirin 81 mg daily atorvastatin 80 mg daily, levothyroxine 300 mcg daily Protonix 40 mg twice daily Brilinta 90 mg twice daily  IV Levophed   Objective:  Vital signs in last 24 hours:  Temp:  [96.5 F (35.8 C)-98.1 F (36.7 C)] 96.6 F (35.9 C) (10/27 0754) Pulse Rate:  [45-87] 45 (10/27 0915) Resp:  [14-29] 18 (10/27 0915) BP: (64-117)/(51-77) 66/52 (10/27 0915) SpO2:  [93 %-97 %] 93 % (10/27 0915) Weight:  [90.4 kg] 90.4 kg (10/26 1712)  Weight change: -0.3 kg Filed Weights   08/08/19 0622 08/09/19 0546 08/09/19 1712  Weight: 99 kg 90.7 kg 90.4 kg    Intake/Output: I/O last 3 completed shifts: In: 1184.7 [P.O.:600; I.V.:526.1; IV Piggyback:58.6] Out: 1899 [Other:1899]   Intake/Output this shift:  Total I/O In: -  Out: 10 [Other:78]   General: Awake, in moderate distress  HEENT: MMM [place] AT anicteric sclera Neck: Positive JVD, no adenopathy CV:  Heart RRR  Lungs: Audible rails bilaterally Abd:  abd SNT/ND with normal BS GU:  Bladder non-palpable Extremities: +2 bilateral LE edema. Skin:  No skin rash Psych:  normal mood and affect Neuro:  no focal deficits   Basic Metabolic Panel: Recent Labs  Lab 08/07/19 0230  08/08/19 0427 08/09/19 0853 08/09/19 1618 08/10/19 0247  NA 136 132* 129* 131* 133*  K 4.9 5.2* 5.3* 5.3* 4.5  CL 108 102 101 101 101  CO2 16* 18* 14* 16* 18*  GLUCOSE 202* 195* 264* 116* 182*  BUN 53* 55* 75* 81* 60*  CREATININE 5.49* 6.31* 8.14* 8.68* 6.10*  CALCIUM 8.2* 8.1* 7.8* 8.0* 7.9*  MG  --   --   --   --  2.0  PHOS  --   --   --  4.9* 5.0*    Liver Function Tests: Recent Labs  Lab 08/09/19 1618 08/10/19 0247  ALBUMIN 2.4* 2.2*   No results for input(s): LIPASE, AMYLASE in the last 168 hours. No results for input(s): AMMONIA in the last 168 hours.  CBC: Recent Labs  Lab 08/06/19 0636 08/06/19 1758 08/07/19 0230 08/08/19 0427 08/09/19 0853  WBC 8.6 6.0 8.8 8.8 8.7  HGB 11.3* 11.0* 10.3* 10.2* 11.3*  HCT 34.4* 33.0* 31.4* 29.8* 34.5*  MCV 97.5 96.8 97.8 96.8 98.0  PLT 124* 121* 134* 122* 117*    Cardiac Enzymes: No results for input(s): CKTOTAL, CKMB, CKMBINDEX, TROPONINI in the last 168 hours.  BNP: Invalid input(s): POCBNP  CBG: Recent Labs  Lab 08/09/19 0730 08/09/19 1137 08/09/19 1616 08/09/19 2159 08/10/19 0646  GLUCAP 267* 214* 111* 154* 190*    Microbiology: Results for orders placed or performed during the hospital encounter of 08/04/19  MRSA PCR Screening     Status: None   Collection Time: 08/04/19 10:47 PM  Specimen: Nasal Mucosa; Nasopharyngeal  Result Value Ref Range Status   MRSA by PCR NEGATIVE NEGATIVE Final    Comment:        The GeneXpert MRSA Assay (FDA approved for NASAL specimens only), is one component of a comprehensive MRSA colonization surveillance program. It is not intended to diagnose MRSA infection nor to guide or monitor treatment for MRSA infections. Performed at [facility], [address] [place], [place] [address]   SARS Coronavirus 2 by RT PCR (hospital order, performed in [facility] hospital lab) Nasopharyngeal Nasopharyngeal Swab     Status: None   Collection Time: 08/05/19 12:19  AM   Specimen: Nasopharyngeal Swab  Result Value Ref Range Status   SARS Coronavirus 2 NEGATIVE NEGATIVE Final    Comment: (NOTE) If result is NEGATIVE SARS-CoV-2 target nucleic acids are NOT DETECTED. The SARS-CoV-2 RNA is generally detectable in upper and lower  respiratory specimens during the acute phase of infection. The lowest  concentration of SARS-CoV-2 viral copies this assay can detect is 250  copies / mL. A negative result does not preclude SARS-CoV-2 infection  and should not be used as the sole basis for treatment or other  patient management decisions.  A negative result may occur with  improper specimen collection / handling, submission of specimen other  than nasopharyngeal swab, presence of viral mutation(s) within the  areas targeted by this assay, and inadequate number of viral copies  (<250 copies / mL). A negative result must be combined with clinical  observations, patient history, and epidemiological information. If result is POSITIVE SARS-CoV-2 target nucleic acids are DETECTED. The SARS-CoV-2 RNA is generally detectable in upper and lower  respiratory specimens dur ing the acute phase of infection.  Positive  results are indicative of active infection with SARS-CoV-2.  Clinical  correlation with patient history and other diagnostic information is  necessary to determine patient infection status.  Positive results do  not rule out bacterial infection or co-infection with other viruses. If result is PRESUMPTIVE POSTIVE SARS-CoV-2 nucleic acids MAY BE PRESENT.   A presumptive positive result was obtained on the submitted specimen  and confirmed on repeat testing.  While 2019 novel coronavirus  (SARS-CoV-2) nucleic acids may be present in the submitted sample  additional confirmatory testing may be necessary for epidemiological  and / or clinical management purposes  to differentiate between  SARS-CoV-2 and other Sarbecovirus currently known to infect humans.   If clinically indicated additional testing with an alternate test  methodology [id number]) is advised. The SARS-CoV-2 RNA is generally  detectable in upper and lower respiratory sp ecimens during the acute  phase of infection. The expected result is Negative. Fact Sheet for Patients:  StrictlyIdeas.no Fact Sheet for Healthcare Providers: BankingDealers.co.za This test is not yet approved or cleared by the [place] FDA and has been authorized for detection and/or diagnosis of SARS-CoV-2 by FDA under an Emergency Use Authorization (EUA).  This EUA will remain in effect (meaning this test can be used) for the duration of the COVID-19 declaration under Section 564(b)(1) of the Act, 21 U.S.C. section 360bbb-3(b)(1), unless the authorization is terminated or revoked sooner. Performed at [facility], [address] [place], [place] [address]     Coagulation Studies: No results for input(s): LABPROT, INR in the last 72 hours.  Urinalysis: Recent Labs    08/09/19 1626  COLORURINE YELLOW  LABSPEC 1.020  PHURINE 6.5  GLUCOSEU >=500*  HGBUR LARGE*  BILIRUBINUR NEGATIVE  KETONESUR 15*  PROTEINUR >300*  NITRITE NEGATIVE  LEUKOCYTESUR TRACE*      Imaging: Dg Chest Port 1 View  Result Date: 08/09/2019 CLINICAL DATA:  Central venous catheter placement, type II diabetes mellitus, hypertension, coronary artery disease post NSTEMI, CABG, and coronary PTCA, stage IV chronic kidney disease, colon cancer EXAM: PORTABLE CHEST 1 VIEW COMPARISON:  Portable exam 1423 hours compared to 0806 hours FINDINGS: RIGHT jugular central venous catheter with tip projecting over proximal SVC. Enlargement of cardiac silhouette post CABG and coronary stenting. Atherosclerotic calcification aorta. Slight pulmonary vascular congestion. Interstitial infiltrates in the mid to lower lungs favor pulmonary edema. Mild RIGHT basilar atelectasis. No pleural  effusion or pneumothorax. Bones unremarkable. IMPRESSION: No pneumothorax following RIGHT jugular line placement. RIGHT basilar atelectasis and probable mild pulmonary edema. Electronically Signed   By: Lavonia Dana M.D.   On: 08/09/2019 14:34   Dg Chest Port 1 View  Result Date: 08/09/2019 CLINICAL DATA:  Cough, wheezing EXAM: PORTABLE CHEST - 1 VIEW COMPARISON:  08/06/2019 FINDINGS: Some interval improvement in the pulmonary vascular congestion seen previously. Persistent elevated right hemidiaphragm with bronchovascular crowding at the lung bases right worse than left. Heart size upper limits normal for technique. Aortic Atherosclerosis (ICD10-170.0). Previous CABG. No effusion. No pneumothorax. Sternotomy wires. IMPRESSION: 1. Improving pulmonary vascular congestion. 2. Persistent elevated right hemidiaphragm. 3. Previous CABG. Electronically Signed   By: Lucrezia Europe M.D.   On: 08/09/2019 08:23     Medications:   .  prismasol BGK 4/2.5 200 mL/hr at 08/09/19 1608  .  prismasol BGK 4/2.5 200 mL/hr at 08/09/19 1601  . furosemide 120 mg (08/10/19 0024)  . heparin 10,000 units/ 20 mL infusion syringe 1,250 Units/hr (08/10/19 0919)  . norepinephrine (LEVOPHED) Adult infusion 20 mcg/min (08/10/19 0940)  . prismasol BGK 2/2.5 dialysis solution 2,000 mL/hr at 08/10/19 0930   . aspirin EC  81 mg Oral Daily  . atorvastatin  80 mg Oral q1800  . Chlorhexidine Gluconate Cloth  6 each Topical Daily  . insulin aspart  0-15 Units Subcutaneous TID WC  . insulin aspart  0-5 Units Subcutaneous QHS  . levothyroxine  300 mcg Oral Q0600  . pantoprazole (PROTONIX) IV  40 mg Intravenous Q12H  . ticagrelor  90 mg Oral BID   acetaminophen, albuterol, guaiFENesin, heparin, heparin, heparin, heparin, nitroGLYCERIN, ondansetron (ZOFRAN) IV, sodium chloride  Assessment/ Plan:   Chronic kidney disease stage IV last renal ultrasound revealed medical renal disease both kidneys no evidence of renal mass or  hydronephrosis.  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  Repeat ultrasound ordered 08/10/2019.  Urine microscopy revealed 21-50 WBCs per high-powered film 6-10 RBCs per high-powered film.  Greater than 300 mg proteinuria.  I would consider Foley trauma as a cause of his microscopic hematuria.  I doubt this represents acute interstitial nephritis or acute glomerulonephritis at this point.  Acute kidney injury most likely related related to hemodynamics contrast-induced nephropathy with marked worsening of renal function.  Possible thromboembolic disease.  Urgent dialysis required dialysis catheter placed 08/09/2019 with initiation of CRRT.  Acute congestive heart failure with most recent echocardiogram revealing normal ejection fraction followed by cardiology.  Continue to remove fluid gently with CRRT as blood pressure tolerates.  NSTEMI status post PCI DES to SVG-RCA 16/07/9603 complicated by focal perforation of graft.  Appreciate assistance from cardiology  Hypotension lowish blood pressures noted may not tolerate much ultrafiltration at this point  Lipidemia continue statin  Diabetes mellitus per primary team.   LOS: [place] @  TODAY@9 :46 AM

## 2019-08-10 NOTE — Progress Notes (Signed)
Cards fellow informed that patient has a new onset slow atrial fibrillation.  Patient intermittently drops heart rate into the upper 30s. Will continue to monitor

## 2019-08-11 DIAGNOSIS — L8990 Pressure ulcer of unspecified site, unspecified stage: Secondary | ICD-10-CM | POA: Insufficient documentation

## 2019-08-11 LAB — CBC
HCT: 35.2 % — ABNORMAL LOW (ref 39.0–52.0)
Hemoglobin: 11.5 g/dL — ABNORMAL LOW (ref 13.0–17.0)
MCH: 32.4 pg (ref 26.0–34.0)
MCHC: 32.7 g/dL (ref 30.0–36.0)
MCV: 99.2 fL (ref 80.0–100.0)
Platelets: 210 10*3/uL (ref 150–400)
RBC: 3.55 MIL/uL — ABNORMAL LOW (ref 4.22–5.81)
RDW: 16.3 % — ABNORMAL HIGH (ref 11.5–15.5)
WBC: 15.9 10*3/uL — ABNORMAL HIGH (ref 4.0–10.5)
nRBC: 0.3 % — ABNORMAL HIGH (ref 0.0–0.2)

## 2019-08-11 LAB — RENAL FUNCTION PANEL
Albumin: 2.1 g/dL — ABNORMAL LOW (ref 3.5–5.0)
Albumin: 2.2 g/dL — ABNORMAL LOW (ref 3.5–5.0)
Anion gap: 17 — ABNORMAL HIGH (ref 5–15)
Anion gap: 11 (ref 5–15)
BUN: 39 mg/dL — ABNORMAL HIGH (ref 8–23)
BUN: 33 mg/dL — ABNORMAL HIGH (ref 8–23)
CO2: 16 mmol/L — ABNORMAL LOW (ref 22–32)
CO2: 22 mmol/L (ref 22–32)
Calcium: 8 mg/dL — ABNORMAL LOW (ref 8.9–10.3)
Calcium: 7.9 mg/dL — ABNORMAL LOW (ref 8.9–10.3)
Chloride: 101 mmol/L (ref 98–111)
Chloride: 100 mmol/L (ref 98–111)
Creatinine, Ser: 3.58 mg/dL — ABNORMAL HIGH (ref 0.61–1.24)
Creatinine, Ser: 3.06 mg/dL — ABNORMAL HIGH (ref 0.61–1.24)
GFR calc Af Amer: 18 mL/min — ABNORMAL LOW (ref >60)
GFR calc Af Amer: 21 mL/min — ABNORMAL LOW (ref >60)
GFR calc non Af Amer: 15 mL/min — ABNORMAL LOW (ref >60)
GFR calc non Af Amer: 18 mL/min — ABNORMAL LOW (ref >60)
Glucose, Bld: 228 mg/dL — ABNORMAL HIGH (ref 70–99)
Glucose, Bld: 209 mg/dL — ABNORMAL HIGH (ref 70–99)
Phosphorus: 5 mg/dL — ABNORMAL HIGH (ref 2.5–4.6)
Phosphorus: 3.8 mg/dL (ref 2.5–4.6)
Potassium: 3.6 mmol/L (ref 3.5–5.1)
Potassium: 3.5 mmol/L (ref 3.5–5.1)
Sodium: 134 mmol/L — ABNORMAL LOW (ref 135–145)
Sodium: 133 mmol/L — ABNORMAL LOW (ref 135–145)

## 2019-08-11 LAB — POCT ACTIVATED CLOTTING TIME
Activated Clotting Time: 219 seconds
Activated Clotting Time: 219 seconds
Activated Clotting Time: 224 seconds
Activated Clotting Time: 224 seconds
Activated Clotting Time: 224 seconds
Activated Clotting Time: 230 seconds
Activated Clotting Time: 230 seconds
Activated Clotting Time: 230 seconds
Activated Clotting Time: 230 seconds
Activated Clotting Time: 235 seconds
Activated Clotting Time: 235 seconds
Activated Clotting Time: 197 seconds
Activated Clotting Time: 202 seconds
Activated Clotting Time: 202 seconds
Activated Clotting Time: 202 seconds
Activated Clotting Time: 202 seconds

## 2019-08-11 LAB — MAGNESIUM: Magnesium: 2.6 mg/dL — ABNORMAL HIGH (ref 1.7–2.4)

## 2019-08-11 LAB — GLUCOSE, CAPILLARY
Glucose-Capillary: 210 mg/dL — ABNORMAL HIGH (ref 70–99)
Glucose-Capillary: 248 mg/dL — ABNORMAL HIGH (ref 70–99)
Glucose-Capillary: 196 mg/dL — ABNORMAL HIGH (ref 70–99)
Glucose-Capillary: 245 mg/dL — ABNORMAL HIGH (ref 70–99)

## 2019-08-11 LAB — APTT: aPTT: >200 seconds (ref 24–36)

## 2019-08-11 MED ORDER — INSULIN GLARGINE 100 UNIT/ML ~~LOC~~ SOLN
10.0000 [IU] | Freq: Every day | SUBCUTANEOUS | Status: DC
  Filled 2019-08-11: qty 0.1

## 2019-08-11 MED ORDER — INSULIN GLARGINE 100 UNIT/ML ~~LOC~~ SOLN
5.0000 [IU] | Freq: Every day | SUBCUTANEOUS | Status: DC
  Administered 2019-08-11: 5 [IU] via SUBCUTANEOUS
  Filled 2019-08-11 (×2): qty 0.05

## 2019-08-11 MED ORDER — LOPERAMIDE HCL 2 MG PO CAPS
2.0000 mg | ORAL_CAPSULE | ORAL | Status: DC | PRN
  Administered 2019-08-11 (×2): 2 mg via ORAL
  Filled 2019-08-11 (×2): qty 1

## 2019-08-11 NOTE — Progress Notes (Signed)
Inpatient Diabetes Program Recommendations  AACE/ADA: New Consensus Statement on Inpatient Glycemic Control (2015)  Target Ranges:  Prepandial:   less than 140 mg/dL      Peak postprandial:   less than 180 mg/dL (1-2 hours)      Critically ill patients:  140 - 180 mg/dL   Lab Results  Component Value Date   GLUCAP 248 (H) 08/11/2019   HGBA1C 8.5 (H) 08/05/2019    Review of Glycemic Control Results for [name], [name] (MRN [id number]) as of 08/11/2019 12:42  Ref. Range 08/10/2019 12:20 08/10/2019 16:42 08/10/2019 22:10 08/11/2019 06:47 08/11/2019 11:34  Glucose-Capillary Latest Ref Range: 70 - 99 mg/dL 200 (H) 162 (H) 160 (H) 210 (H) 248 (H)   Diabetes history: DM 2 Outpatient Diabetes medications: NPH 25 units bid, Regular insulin 12 units bid Current orders for Inpatient glycemic control:  Novolog moderate tid with meals and HS Inpatient Diabetes Program Recommendations:    May consider adding Levemir 5 units bid.   Thank you, Nani Gasser. Emonie Espericueta, RN, MSN, CDE  Diabetes Coordinator Inpatient Glycemic Control Team Team Pager [phone] (8am-5pm) 08/11/2019 12:43 PM

## 2019-08-11 NOTE — Progress Notes (Signed)
NAME:  [name], MRN:  [id number], DOB:  10-17-1940, LOS: 5 ADMISSION DATE:  08/06/2019, CONSULTATION DATE:  10/26 REFERRING MD: Debara Pickett  , CHIEF COMPLAINT:  Acute respiratory failure   Brief History   78 year old male patient with prior stage IV CKD and severe underlying coronary artery disease.  Initially admitted to [facility] with non-ST elevation MI.  Underwent cardiac catheterization on 10/23 had small localized perforation of SVG during stent placement, transferred to [facility] because of this.  No further cardiac interventions completed however developed progressive renal failure and associated respiratory failure ultimately requiring critical care and nephrology consult on 10/26.  History of present illness    This is a pleasant 78 year old white male with a significant history of coronary artery disease as mentioned below.  He was admitted to [facility] initially with chief complaint of chest pain, elevated troponin and working diagnosis of non-ST elevation MI.  He went for cardiac catheterization at [facility] regional, catheterization was notable for chronic occlusion of the SVG to OM with severe distal disease of the RCA which was felt to be the culprit lesion.  Intervention was complicated by focal perforation of the SVG during stent placement, contrast extravasation was controlled with balloon tamponade, however contained extravasation with still evident and because of this he was transferred to [facility].  As noted in history has a history of stage IV chronic kidney disease, on 10/24 he was started on bicarbonate drip as well as nitro infusion for increased hypertension.  Urine output was minimal.  Limited echocardiogram was obtained on 10/24 this was negative for pericardial effusion.  He did have recurrent chest pain the a.m. of 10/25 treated with nitroglycerin.  On 10/26 his renal function had continued to worsen.  Creatinine now up to 8.  Volume overloaded with worsening tachypnea,  cough, oxygen requirements.  Chest x-ray showing pulmonary edema.  Critical care asked to evaluate for ICU transfer as well as possible dialysis catheter placement and potential need for escalated pulmonary support  Past Medical History  Coronary artery disease status post CABG x3 vessels in 1997.  Carotid artery disease status post CEA x2, diabetes type 2, hypertension, hyperlipidemia, CKD stage IV, prior CVA.  Most recently non-ST elevation MI with PCI to the venous graft to RCA complicated by perforation of graft.  Significant Hospital Events   10/23 admitted from [facility] regional for possible need for intervention concerning SVG perforation.  Serum creatinine 5.38 at that time  10/23 through 10/26.  Progressive respiratory failure Portable chest x-ray with cardiomegaly, chronic elevated right hemidiaphragm, and vascular congestion creatinine up to 8.14, potassium 5.3, sodium down to 129 complaining of worsening shortness of breath pulmonary asked to evaluate Transfer to the ICU, hemodialysis catheter placed and CRRT initiated  10/27-escalating norepi requirements.  Unable to remove volume with CRRT  Consults:  Pulmonary 10/26 Nephrology 10/26  Procedures:  Lt radial a line 10/27 >>  Significant Diagnostic Tests:  10/24 Limited echocardiogram: EF 60 to 65% with normal left ventricular function normal right ventricular function no pericardial effusion  Micro Data:    Antimicrobials:    Interim history/subjective:  Stable overnight. Pulling fluid off on HD Continues on levophed  Objective   Blood pressure (!) 75/61, pulse (!) 54, temperature (!) 97.2 F (36.2 C), resp. rate (!) 21, height 6\' 2"  (1.88 m), weight 90.4 kg, SpO2 96 %.        Intake/Output Summary (Last 24 hours) at 08/11/2019 8250 Last data filed at 08/11/2019 0600 Gross  per 24 hour  Intake 714.27 ml  Output 1253 ml  Net -538.73 ml   Filed Weights   08/08/19 0622 08/09/19 0546 08/09/19 1712  Weight: 99  kg 90.7 kg 90.4 kg    Examination: Gen:      No acute distress HEENT:  EOMI, sclera anicteric Neck:     No masses; no thyromegaly Lungs:    Clear to auscultation bilaterally; normal respiratory effort CV:         Regular rate and rhythm; no murmurs Abd:      + bowel sounds; soft, non-tender; no palpable masses, no distension Ext:    No edema; adequate peripheral perfusion Skin:      Warm and dry; no rash Neuro: alert and oriented x 3 Psych: normal mood and affect  Resolved Hospital Problem list     Assessment & Plan:  Acute hypoxic respiratory failure in setting of pulmonary edema and volume overload, chronically elevated right hemidiaphragm  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 Suspect secondary to contrast superimposed on underlying CKD Plan On CRRT with volume removal  Diabetes type 2 Plan Sliding scale insulin  Anemia without evidence of bleeding Plan Follow CBC.  Best practice:  Diet: PO diet Pain/Anxiety/Delirium protocol (if indicated): NA VAP protocol (if indicated): NA DVT prophylaxis: scd GI prophylaxis: PPI Glucose control: SSI Mobility: BR Code Status: full code but does not want prolonged support Family Communication: per primary Disposition: ICU  Labs   CBC: Recent Labs  Lab 08/07/19 0230 08/08/19 0427 08/09/19 0853 08/10/19 1031 08/11/19 0500  WBC 8.8 8.8 8.7 20.7*   21.1* 15.9*    NEUTROABS  --   --   --  19.3*  --   HGB 10.3* 10.2* 11.3* 11.9*   11.9* 11.5*  HCT 31.4* 29.8* 34.5* 36.5*   35.8* 35.2*  MCV 97.8 96.8 98.0 100.3*   98.6 99.2  PLT 134* 122* 117* 179   174 354    Basic Metabolic Panel: Recent Labs  Lab 08/09/19 0853 08/09/19 1618 08/10/19 0247 08/10/19 1553 08/11/19 0500  NA 129* 131* 133* 134* 134*  K 5.3* 5.3* 4.5 4.4 3.6  CL 101 101 101 102 101  CO2 14* 16* 18* 17* 16*  GLUCOSE 264* 116* 182* 188* 228*  BUN 75* 81* 60* 48* 39*  CREATININE 8.14* 8.68* 6.10* 4.54* 3.58*  CALCIUM 7.8* 8.0* 7.9* 7.9* 8.0*  MG  --   --  2.0  --  2.6*  PHOS  --  4.9* 5.0* 5.9* 5.0*   GFR: Estimated Creatinine Clearance: 19.5 mL/min (A) (by C-G formula based on SCr of 3.58 mg/dL (H)). Recent Labs  Lab 08/08/19 0427 08/09/19 0853 08/10/19 1031 08/11/19 0500  WBC 8.8 8.7 20.7*   21.1* 15.9*    Liver Function Tests: Recent Labs  Lab 08/09/19 1618 08/10/19 0247 08/10/19 1553 08/11/19 0500  ALBUMIN 2.4* 2.2* 2.1* 2.1*   No results for input(s): LIPASE, AMYLASE in the last 168 hours. No results for input(s): AMMONIA in the last 168 hours.  ABG No results  found for: PHART, PCO2ART, PO2ART, HCO3, TCO2, ACIDBASEDEF, O2SAT   Coagulation Profile: Recent Labs  Lab 08/04/19 2250  INR 1.1    Cardiac Enzymes: No results for input(s): CKTOTAL, CKMB, CKMBINDEX, TROPONINI in the last 168 hours.  HbA1C: Hemoglobin A1C  Date/Time Value Ref Range Status  11/08/2011 02:52 AM 10.2 (H) 4.2 - 6.3 % Final    Comment:    The American Diabetes Association recommends that a primary goal of therapy should be <7% and that physicians should reevaluate the treatment regimen in patients with HbA1c values consistently >8%.    Hgb A1c MFr Bld  Date/Time Value Ref Range Status  08/05/2019 04:39 AM 8.5 (H) 4.8 - 5.6 % Final    Comment:    (NOTE) Pre diabetes:          5.7%-6.4% Diabetes:              >6.4% Glycemic control for   <7.0% adults with diabetes    06/16/2018 9.8 (A) 4.0 - 6.0 Final    CBG: Recent Labs  Lab 08/09/19 2159 08/10/19 0646 08/10/19 1220 08/10/19 1642 08/10/19 2210  GLUCAP 154* 190* 200* 162* 160*    The patient is critically ill with multiple organ system failure and requires high complexity decision making for assessment and support, frequent evaluation and titration of therapies, advanced monitoring, review of radiographic studies and interpretation of complex data.   Critical Care Time devoted to patient care services, exclusive of separately billable procedures, described in this note is 35 minutes.   Marshell Garfinkel MD [facility] Pager [phone] If no answer call [phone] 08/11/2019, 6:07 AM

## 2019-08-11 NOTE — Progress Notes (Signed)
Progress Note  Patient Name: [name] Date of Encounter: 08/11/2019  Primary Cardiologist: Dr. Nehemiah Massed  Subjective  O/N Events: Developed Atrial Fibrillation w/ Bradycardia  [name] was examined and evaluated at bedside this AM. He was observed resting comfortably in bed this am. He states he has no complaints at this time. Denies any chest pain, palpitations, dyspnea  Inpatient Medications    Scheduled Meds: . aspirin EC  81 mg Oral Daily  . atorvastatin  80 mg Oral q1800  . Chlorhexidine Gluconate Cloth  6 each Topical Daily  . insulin aspart  0-15 Units Subcutaneous TID WC  . insulin aspart  0-5 Units Subcutaneous QHS  . levothyroxine  300 mcg Oral Q0600  . pantoprazole (PROTONIX) IV  40 mg Intravenous Q12H  . ticagrelor  90 mg Oral BID   Continuous Infusions: .  prismasol BGK 4/2.5 200 mL/hr at 08/10/19 1945  .  prismasol BGK 4/2.5 200 mL/hr at 08/10/19 1944  . sodium chloride    . heparin 10,000 units/ 20 mL infusion syringe 1,350 Units/hr (08/11/19 0618)  . norepinephrine (LEVOPHED) Adult infusion 12 mcg/min (08/10/19 1901)  . prismasol BGK 2/2.5 dialysis solution 2,000 mL/hr at 08/11/19 0442   PRN Meds: Place/Maintain arterial line **AND** sodium chloride, acetaminophen, albuterol, guaiFENesin, heparin, heparin, heparin, heparin, nitroGLYCERIN, ondansetron (ZOFRAN) IV, sodium chloride   Vital Signs    Vitals:   08/11/19 0200 08/11/19 0300 08/11/19 0400 08/11/19 0600  BP:      Pulse: (!) 52 (!) 45 (!) 53 (!) 42  Resp: 17 15 15 17   Temp:   (!) 97.3 F (36.3 C)   TempSrc:      SpO2: 96% 97% 97% 97%  Weight:    96.6 kg  Height:        Intake/Output Summary (Last 24 hours) at 08/11/2019 0715 Last data filed at 08/11/2019 0700 Gross per 24 hour  Intake 689.95 ml  Output 1262 ml  Net -572.05 ml   Last 3 Weights 08/11/2019 08/10/2019 08/09/2019  Weight (lbs) 212 lb 15.4 oz 211 lb 13.8 oz 199 lb 3.8 oz  Weight (kg) 96.6 kg 96.1 kg 90.374 kg       Telemetry    Developed A.fib yesterday afternoon. HR 40-60s - Personally Reviewed  ECG    Atrial Fibrillation, ST changes on septal leads consistent w/ prior EKGs - Personally Reviewed  Physical Exam   Gen: Well-developed, obese, NAD HEENT: NCAT head, hearing intact, Nasal Cannula in place, EOMi Neck: supple, ROM intact, dialysis catheter in place and functioning CV: RRR, S1, S2 normal, No rubs, no murmurs Pulm: Bibasilar rales, no wheezing Abd: Soft, BS+, NTND Extm: ROM intact, Peripheral pulses intact, 1+ pitting edema bilateral LEs Skin: Dry, Warm, normal turgor  Neuro: AAOx3  Labs    High Sensitivity Troponin:   Recent Labs  Lab 08/04/19 2250 08/05/19 0043 08/05/19 0818 08/05/19 1624  TROPONINIHS 471* 507* 1,266* 1,123*      Chemistry Recent Labs  Lab 08/10/19 0247 08/10/19 1553 08/11/19 0500  NA 133* 134* 134*  K 4.5 4.4 3.6  CL 101 102 101  CO2 18* 17* 16*  GLUCOSE 182* 188* 228*  BUN 60* 48* 39*  CREATININE 6.10* 4.54* 3.58*  CALCIUM 7.9* 7.9* 8.0*  ALBUMIN 2.2* 2.1* 2.1*  GFRNONAA 8* 11* 15*  GFRAA 9* 13* 18*  ANIONGAP 14 15 17*     Hematology Recent Labs  Lab 08/09/19 0853 08/10/19 1031 08/11/19 0500  WBC 8.7 20.7*  21.1* 15.9*  RBC 3.52* 3.64*  3.63* 3.55*  HGB 11.3* 11.9*  11.9* 11.5*  HCT 34.5* 36.5*  35.8* 35.2*  MCV 98.0 100.3*  98.6 99.2  MCH 32.1 32.7  32.8 32.4  MCHC 32.8 32.6  33.2 32.7  RDW 16.0* 16.2*  16.2* 16.3*  PLT 117* 179  174 210    BNPNo results for input(s): BNP, PROBNP in the last 168 hours.   DDimer No results for input(s): DDIMER in the last 168 hours.   Radiology    Dg Chest Port 1 View  Result Date: 08/10/2019 CLINICAL DATA:  Pulmonary vascular congestion. EXAM: PORTABLE CHEST 1 VIEW COMPARISON:  08/09/2019.  04/20/2016. 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 Cardiomegaly. Mild pulmonary venous congestion. Mild bibasilar atelectasis. Minimal right base pleural  thickening again noted consistent scarring. No pneumothorax. Prior thoracic vertebroplasty. IMPRESSION: 1.  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 2.  Prior CABG.  Cardiomegaly.  No pulmonary venous congestion. 3. Mild bibasilar atelectasis. Tiny right pleural effusion versus scarring. Electronically Signed   By: Marcello Moores  Register   On: 08/10/2019 12:15   Dg Chest Port 1 View  Result Date: 08/09/2019 CLINICAL DATA:  Central venous catheter placement, type II diabetes mellitus, hypertension, coronary artery disease post NSTEMI, CABG, and coronary PTCA, stage IV chronic kidney disease, colon cancer EXAM: PORTABLE CHEST 1 VIEW COMPARISON:  Portable exam 1423 hours compared to 0806 hours FINDINGS: RIGHT jugular central venous catheter with tip projecting over proximal SVC. Enlargement of cardiac silhouette post CABG and coronary stenting. Atherosclerotic calcification aorta. Slight pulmonary vascular congestion. Interstitial infiltrates in the mid to lower lungs favor pulmonary edema. Mild RIGHT basilar atelectasis. No pleural effusion or pneumothorax. Bones unremarkable. IMPRESSION: No pneumothorax following RIGHT jugular line placement. RIGHT basilar atelectasis and probable mild pulmonary edema. Electronically Signed   By: Lavonia Dana M.D.   On: 08/09/2019 14:34   Dg Chest Port 1 View  Result Date: 08/09/2019 CLINICAL DATA:  Cough, wheezing EXAM: PORTABLE CHEST - 1 VIEW COMPARISON:  08/06/2019 FINDINGS: Some interval improvement in the pulmonary vascular congestion seen previously. Persistent elevated right hemidiaphragm with bronchovascular crowding at the lung bases right worse than left. Heart size upper limits normal for technique. Aortic Atherosclerosis (ICD10-170.0). Previous CABG. No effusion. No pneumothorax. Sternotomy wires. IMPRESSION: 1. Improving pulmonary vascular congestion. 2. Persistent elevated right hemidiaphragm. 3. Previous CABG. Electronically Signed   By: Lucrezia Europe M.D.    On: 08/09/2019 08:23    Cardiac Studies   Cardiac Catheterization and Percutaneous Coronary Intervention 10.23.2020  Conclusions: 1. See diagnostic angiogram by Dr. Nehemiah Massed for details of coronary/graft anatomy. 2. Severe disease involving SVG-RCA with 80-90% stenosis. 3. Successful PCI to SVG-RCA using Resolute Onyx 4.0 x 38 mm drug-eluting stent with 10% residual stenosis and TIMI-3 flow. 4. Focal perforation of the SVG-RCA during stent deployment. This was successfully contained with internal tamponade.  Recommendations: 1. Dual antiplatelet therapy with aspirin and ticagrelor for at least 12 months. 2. Aggressive secondary prevention. 3. Transfer to [facility] for ICU monitoring, given focal perforation of SVG-RCA. If the patient were to become hemodynamically unstable or develop worsening anemia, repeat angiography of SVG-RCA and possible covered stent placement would need to be considered. _____________   2D Echocardiogram 10.24.2020  IMPRESSIONS  1. Left ventricular ejection fraction, by visual estimation, is 60 to 65%. The left ventricle has normal function. Normal left ventricular size. There is no left ventricular hypertrophy. 2. Global right  ventricle has normal systolic function.The right ventricular size is normal. No increase in right ventricular wall thickness. 3. Left atrial size was normal. 4. Right atrial size was norma 5. Severe mitral annular calcification. Mild calcification of the anterior mitral valve leaflet(s).. No evidence of mitral valve regurgitation. Mild mitral stenosis. 6. The tricuspid valve is normal in structure. Tricuspid valve regurgitation is mild. 7. The aortic valve is tricuspid Aortic valve regurgitation is mild by color flow Doppler. Moderate aortic valve sclerosis/calcification without any evidence of aortic stenosis. 8. The pulmonic valve was normal in structure. Pulmonic valve regurgitation is not visualized by color  flow Doppler. 9. Normal pulmonary artery systolic pressure. 10. The inferior vena cava is normal in size with greater than 50% respiratory variability, suggesting right atrial pressure of 3 mmHg. 11. No evidence of pericardial effusion. _____________   Patient Profile     78 y.o. male w/ a h/o CAD s/p CABG x 3 in 1997, carotid dzs s/p CEA x 2, DMII, HTN, HL, CKD V, and prior CVA, who was tx from [facility] following nstemi and PCI of the VG  RCA w/ perforation of the graft.  Assessment & Plan    Slow Atrial Fibrillation Yesterday's telemetry shows In & out A.fib. Currently in A.fib w/ HR in 60s. CHADS-VASC of 6 due to age, htn, hx of mi, chf, dm. EKG does not quite meet criteria for LBBB. Not on antiarrhythmic therapy due to bradycardia/hypotension - Start heparin gtt - Telemetry - EP eval for pacer  NSTEMI Presented w/ chest pain w/ Elevated HsTrop 507->1266 on admission. S/p PCI/DES to SVG-RCA on 41/58/30 complicated by focal perforation of the graft. Limited echo on 10/24 with normal EF and no effusion. Currently chest pain free. Still on Levo 48mcg - C/w Levophed gtt to keep MAP >65 - C/w nitrate PRN for chest pain - C/w asa, statin, Brilinta - Telemetry  Acute diastolic CHF Hypervolemic on exam yesterday w/ oxygen requirement and pulmonary edema. Dyspnea improved per pt overnight after starting dialysis. Carvedilol held due to bradycardia yesterday. - C/w hold BB - Daily weights, I&Os - Diuresed via dialysis   Essential hypertension Am bp 86/60 - Hold home bp meds due to hypotension   Hyperlipidemia LDL 82.  - C/w atorvastatin 80mg  daily  CKD stage V Followed by [facility] nephro. Post-cath, worsening creatinine 6.31->8.14 CRRT -> 6.1->3.6. 1.2L out via CRRT overnight - Dialysis per nephro - Unclear if native renal fx will recover  DM type II on insulin -A1c 8.5. Continue SSI while in hospital.   For questions or updates, please contact [place] [facility] [place] Please consult  [email or web address] for contact info under     Signed, Mosetta Anis, MD  08/11/2019, 7:15 AM   PGY-2, [place] Pager: [phone]  Attending attestation to follow

## 2019-08-11 NOTE — Progress Notes (Signed)
Called to assess [name]. Bleeding from insertion site. Area cleansed, dried, dressing applied. Line remains intact and funtioning.

## 2019-08-11 NOTE — Progress Notes (Signed)
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  Non-ST segment elevated MI I cardiac catheterization stent complicated by perforation.  Transferred to [facility] 08/09/2019.  Acute on chronic kidney injury Baseline serum creatinine 3.22 August 2018.  Patient initiated on CRRT 08/09/2019  Blood pressure 175/43 pulse 63 temperature 97.4 O2 sats 96% 3 L nasal cannula  Sodium 134 potassium 3.6 chloride 101 CO2 16 BUN 29 creatinine 3.58 glucose 228 calcium 8.0 phosphorus 5.0 magnesium 2.6 WBC 15.9 hemoglobin 11.5 platelets 210  Aspirin 81 mg daily atorvastatin 80 mg daily, levothyroxine 300 mcg daily Protonix 40 mg twice daily Brilinta 90 mg twice daily  IV Levophed  Continue to ultrafilter 1 L/day   Objective:  Vital signs in last 24 hours:  Temp:  [96.4 F (35.8 C)-97.4 F (36.3 C)] 97.4 F (36.3 C) (10/28 0800) Pulse Rate:  [33-69] 33 (10/28 0800) Resp:  [13-23] 15 (10/28 0800) BP: (62-98)/(47-65) 75/61 (10/27 1715) SpO2:  [93 %-97 %] 96 % (10/28 0800) Arterial Line BP: (130-173)/(35-48) 157/37 (10/28 0800) Weight:  [96.6 kg] 96.6 kg (10/28 0600)  Weight change: 6.226 kg Filed Weights   08/09/19 1712 08/10/19 0500 08/11/19 0600  Weight: 90.4 kg 96.1 kg 96.6 kg    Intake/Output: I/O last 3 completed shifts: In: 1040.5 [I.V.:1038.4; IV Piggyback:2.1] Out: 2507 [Other:2507]   Intake/Output this shift:  Total I/O In: 26.3 [I.V.:26.3] Out: 107 [Other:107]   General: Awake, in moderate distress  HEENT: MMM  AT anicteric sclera Neck: Positive JVD, no adenopathy CV:  Heart RRR  Lungs: Audible rails bilaterally Abd:  abd SNT/ND with normal BS GU:  Bladder non-palpable Extremities: +2 bilateral LE edema. Skin:  No skin rash Psych:  normal mood and affect Neuro:  no focal deficits   Basic  Metabolic Panel: Recent Labs  Lab 08/09/19 0853 08/09/19 1618 08/10/19 0247 08/10/19 1553 08/11/19 0500  NA 129* 131* 133* 134* 134*  K 5.3* 5.3* 4.5 4.4 3.6  CL 101 101 101 102 101  CO2 14* 16* 18* 17* 16*  GLUCOSE 264* 116* 182* 188* 228*  BUN 75* 81* 60* 48* 39*  CREATININE 8.14* 8.68* 6.10* 4.54* 3.58*  CALCIUM 7.8* 8.0* 7.9* 7.9* 8.0*  MG  --   --  2.0  --  2.6*  PHOS  --  4.9* 5.0* 5.9* 5.0*    Liver Function Tests: Recent Labs  Lab 08/09/19 1618 08/10/19 0247 08/10/19 1553 08/11/19 0500  ALBUMIN 2.4* 2.2* 2.1* 2.1*   No results for input(s): LIPASE, AMYLASE in the last 168 hours. No results for input(s): AMMONIA in the last 168 hours.  CBC: Recent Labs  Lab 08/07/19 0230 08/08/19 0427 08/09/19 0853 08/10/19 1031 08/11/19 0500  WBC 8.8 8.8 8.7 20.7*  21.1* 15.9*  NEUTROABS  --   --   --  19.3*  --   HGB 10.3* 10.2* 11.3* 11.9*  11.9* 11.5*  HCT 31.4* 29.8* 34.5* 36.5*  35.8* 35.2*  MCV 97.8 96.8 98.0 100.3*  98.6 99.2  PLT 134* 122* 117* 179  174 210    Cardiac Enzymes: No results for input(s): CKTOTAL, CKMB, CKMBINDEX, TROPONINI in the last 168 hours.  BNP: Invalid input(s): POCBNP  CBG: Recent Labs  Lab 08/10/19 0646 08/10/19 1220 08/10/19 1642 08/10/19 2210 08/11/19 0647  GLUCAP 190* 200* 162* 160* 210*    Microbiology: Results for orders placed  or performed during the hospital encounter of 08/04/19  MRSA PCR Screening     Status: None   Collection Time: 08/04/19 10:47 PM   Specimen: Nasal Mucosa; Nasopharyngeal  Result Value Ref Range Status   MRSA by PCR NEGATIVE NEGATIVE Final    Comment:        The GeneXpert MRSA Assay (FDA approved for NASAL specimens only), is one component of a comprehensive MRSA colonization surveillance program. It is not intended to diagnose MRSA infection nor to guide or monitor treatment for MRSA infections. Performed at [facility], [address] [place], [place] [address]    SARS Coronavirus 2 by RT PCR (hospital order, performed in [facility] hospital lab) Nasopharyngeal Nasopharyngeal Swab     Status: None   Collection Time: 08/05/19 12:19 AM   Specimen: Nasopharyngeal Swab  Result Value Ref Range Status   SARS Coronavirus 2 NEGATIVE NEGATIVE Final    Comment: (NOTE) If result is NEGATIVE SARS-CoV-2 target nucleic acids are NOT DETECTED. The SARS-CoV-2 RNA is generally detectable in upper and lower  respiratory specimens during the acute phase of infection. The lowest  concentration of SARS-CoV-2 viral copies this assay can detect is 250  copies / mL. A negative result does not preclude SARS-CoV-2 infection  and should not be used as the sole basis for treatment or other  patient management decisions.  A negative result may occur with  improper specimen collection / handling, submission of specimen other  than nasopharyngeal swab, presence of viral mutation(s) within the  areas targeted by this assay, and inadequate number of viral copies  (<250 copies / mL). A negative result must be combined with clinical  observations, patient history, and epidemiological information. If result is POSITIVE SARS-CoV-2 target nucleic acids are DETECTED. The SARS-CoV-2 RNA is generally detectable in upper and lower  respiratory specimens dur ing the acute phase of infection.  Positive  results are indicative of active infection with SARS-CoV-2.  Clinical  correlation with patient history and other diagnostic information is  necessary to determine patient infection status.  Positive results do  not rule out bacterial infection or co-infection with other viruses. If result is PRESUMPTIVE POSTIVE SARS-CoV-2 nucleic acids MAY BE PRESENT.   A presumptive positive result was obtained on the submitted specimen  and confirmed on repeat testing.  While 2019 novel coronavirus  (SARS-CoV-2) nucleic acids may be present in the submitted sample  additional confirmatory testing  may be necessary for epidemiological  and / or clinical management purposes  to differentiate between  SARS-CoV-2 and other Sarbecovirus currently known to infect humans.  If clinically indicated additional testing with an alternate test  methodology [phone]) is advised. The SARS-CoV-2 RNA is generally  detectable in upper and lower respiratory sp ecimens during the acute  phase of infection. The expected result is Negative. Fact Sheet for Patients:  StrictlyIdeas.no Fact Sheet for Healthcare Providers: BankingDealers.co.za This test is not yet approved or cleared by the [place] FDA and has been authorized for detection and/or diagnosis of SARS-CoV-2 by FDA under an Emergency Use Authorization (EUA).  This EUA will remain in effect (meaning this test can be used) for the duration of the COVID-19 declaration under Section 564(b)(1) of the Act, 21 U.S.C. section 360bbb-3(b)(1), unless the authorization is terminated or revoked sooner. Performed at [facility], [address] [place], [place] [address]     Coagulation Studies: No results for input(s): LABPROT, INR in the last 72 hours.  Urinalysis: Recent  Labs    08/09/19 1626  COLORURINE YELLOW  LABSPEC 1.020  PHURINE 6.5  GLUCOSEU >=500*  HGBUR LARGE*  BILIRUBINUR NEGATIVE  KETONESUR 15*  PROTEINUR >300*  NITRITE NEGATIVE  LEUKOCYTESUR TRACE*      Imaging: Dg Chest Port 1 View  Result Date: 08/10/2019 CLINICAL DATA:  Pulmonary vascular congestion. EXAM: PORTABLE CHEST 1 VIEW COMPARISON:  08/09/2019.  04/20/2016. 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 Cardiomegaly. Mild pulmonary venous congestion. Mild bibasilar atelectasis. Minimal right base pleural thickening again noted consistent scarring. No pneumothorax. Prior thoracic vertebroplasty. IMPRESSION: 1.  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 2.  Prior  CABG.  Cardiomegaly.  No pulmonary venous congestion. 3. Mild bibasilar atelectasis. Tiny right pleural effusion versus scarring. Electronically Signed   By: Marcello Moores  Register   On: 08/10/2019 12:15   Dg Chest Port 1 View  Result Date: 08/09/2019 CLINICAL DATA:  Central venous catheter placement, type II diabetes mellitus, hypertension, coronary artery disease post NSTEMI, CABG, and coronary PTCA, stage IV chronic kidney disease, colon cancer EXAM: PORTABLE CHEST 1 VIEW COMPARISON:  Portable exam 1423 hours compared to 0806 hours FINDINGS: RIGHT jugular central venous catheter with tip projecting over proximal SVC. Enlargement of cardiac silhouette post CABG and coronary stenting. Atherosclerotic calcification aorta. Slight pulmonary vascular congestion. Interstitial infiltrates in the mid to lower lungs favor pulmonary edema. Mild RIGHT basilar atelectasis. No pleural effusion or pneumothorax. Bones unremarkable. IMPRESSION: No pneumothorax following RIGHT jugular line placement. RIGHT basilar atelectasis and probable mild pulmonary edema. Electronically Signed   By: Lavonia Dana M.D.   On: 08/09/2019 14:34     Medications:   .  prismasol BGK 4/2.5 200 mL/hr at 08/10/19 1945  .  prismasol BGK 4/2.5 200 mL/hr at 08/10/19 1944  . sodium chloride    . heparin 10,000 units/ 20 mL infusion syringe 1,300 Units/hr (08/11/19 0731)  . norepinephrine (LEVOPHED) Adult infusion 12 mcg/min (08/11/19 0902)  . prismasol BGK 2/2.5 dialysis solution 2,000 mL/hr at 08/11/19 0718   . aspirin EC  81 mg Oral Daily  . atorvastatin  80 mg Oral q1800  . Chlorhexidine Gluconate Cloth  6 each Topical Daily  . insulin aspart  0-15 Units Subcutaneous TID WC  . insulin aspart  0-5 Units Subcutaneous QHS  . levothyroxine  300 mcg Oral Q0600  . pantoprazole (PROTONIX) IV  40 mg Intravenous Q12H  . ticagrelor  90 mg Oral BID   Place/Maintain arterial line **AND** sodium chloride, acetaminophen, albuterol, guaiFENesin,  heparin, heparin, heparin, heparin, nitroGLYCERIN, ondansetron (ZOFRAN) IV, sodium chloride  Assessment/ Plan:   Chronic kidney disease stage IV followed at [facility] last renal ultrasound revealed medical renal disease both kidneys no evidence of renal mass or hydronephrosis.  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  Repeat ultrasound ordered 08/10/2019.  Urine microscopy revealed 21-50 WBCs per high-powered film 6-10 RBCs per high-powered film.  Greater than 300 mg proteinuria.  I would consider Foley trauma as a cause of his microscopic hematuria.  I doubt this represents acute interstitial nephritis or acute glomerulonephritis at this point.  Acute kidney injury most likely related related to hemodynamics contrast-induced nephropathy with marked worsening of renal function.  Possible thromboembolic disease.  Urgent dialysis required dialysis catheter placed 08/09/2019 with initiation of CRRT -continues to be oliguric will continue on CRRT  Acute congestive heart failure with most recent echocardiogram revealing normal ejection fraction followed by cardiology.  Continue to remove fluid gently with  CRRT as blood pressure tolerates.  Atrial fibrillation now placed on heparin drip not on antihypertensives secondary to hypotension and bradycardia  NSTEMI status post PCI DES to SVG-RCA 49/32/4199 complicated by focal perforation of graft.  Appreciate assistance from cardiology  Hypotension   A-line seems to be monitoring blood pressures considerably higher than cuff.  Continue to wean Levophed  Lipidemia continue statin  Diabetes mellitus per primary team.   LOS: [place] @TODAY @9 :06 AM

## 2019-08-11 NOTE — Progress Notes (Signed)
Assisted tele visit to patient with family member.  Jacora Hopkins M, RN  

## 2019-08-12 ENCOUNTER — Inpatient Hospital Stay (HOSPITAL_COMMUNITY): Payer: Medicare HMO

## 2019-08-12 DIAGNOSIS — I959 Hypotension, unspecified: Secondary | ICD-10-CM

## 2019-08-12 DIAGNOSIS — M542 Cervicalgia: Secondary | ICD-10-CM

## 2019-08-12 LAB — HEPARIN LEVEL (UNFRACTIONATED)
Heparin Unfractionated: 1.08 IU/mL — ABNORMAL HIGH (ref 0.30–0.70)
Heparin Unfractionated: 1.04 IU/mL — ABNORMAL HIGH (ref 0.30–0.70)

## 2019-08-12 LAB — POCT I-STAT 7, (LYTES, BLD GAS, ICA,H+H)
Acid-base deficit: 3 mmol/L — ABNORMAL HIGH (ref 0.0–2.0)
Bicarbonate: 20.7 mmol/L (ref 20.0–28.0)
Calcium, Ion: 1.19 mmol/L (ref 1.15–1.40)
HCT: 32 % — ABNORMAL LOW (ref 39.0–52.0)
Hemoglobin: 10.9 g/dL — ABNORMAL LOW (ref 13.0–17.0)
O2 Saturation: 95 %
Potassium: 3.8 mmol/L (ref 3.5–5.1)
Sodium: 134 mmol/L — ABNORMAL LOW (ref 135–145)
TCO2: 22 mmol/L (ref 22–32)
pCO2 arterial: 30.8 mmHg — ABNORMAL LOW (ref 32.0–48.0)
pH, Arterial: 7.434 (ref 7.350–7.450)
pO2, Arterial: 72 mmHg — ABNORMAL LOW (ref 83.0–108.0)

## 2019-08-12 LAB — RENAL FUNCTION PANEL
Albumin: 2.4 g/dL — ABNORMAL LOW (ref 3.5–5.0)
Albumin: 2.3 g/dL — ABNORMAL LOW (ref 3.5–5.0)
Anion gap: 14 (ref 5–15)
Anion gap: 10 (ref 5–15)
BUN: 24 mg/dL — ABNORMAL HIGH (ref 8–23)
BUN: 34 mg/dL — ABNORMAL HIGH (ref 8–23)
CO2: 21 mmol/L — ABNORMAL LOW (ref 22–32)
CO2: 21 mmol/L — ABNORMAL LOW (ref 22–32)
Calcium: 8.3 mg/dL — ABNORMAL LOW (ref 8.9–10.3)
Calcium: 8.6 mg/dL — ABNORMAL LOW (ref 8.9–10.3)
Chloride: 97 mmol/L — ABNORMAL LOW (ref 98–111)
Chloride: 101 mmol/L (ref 98–111)
Creatinine, Ser: 2.86 mg/dL — ABNORMAL HIGH (ref 0.61–1.24)
Creatinine, Ser: 3.61 mg/dL — ABNORMAL HIGH (ref 0.61–1.24)
GFR calc Af Amer: 23 mL/min — ABNORMAL LOW (ref >60)
GFR calc Af Amer: 17 mL/min — ABNORMAL LOW (ref >60)
GFR calc non Af Amer: 20 mL/min — ABNORMAL LOW (ref >60)
GFR calc non Af Amer: 15 mL/min — ABNORMAL LOW (ref >60)
Glucose, Bld: 199 mg/dL — ABNORMAL HIGH (ref 70–99)
Glucose, Bld: 269 mg/dL — ABNORMAL HIGH (ref 70–99)
Phosphorus: 3.1 mg/dL (ref 2.5–4.6)
Phosphorus: 3.7 mg/dL (ref 2.5–4.6)
Potassium: 3.1 mmol/L — ABNORMAL LOW (ref 3.5–5.1)
Potassium: 3.9 mmol/L (ref 3.5–5.1)
Sodium: 132 mmol/L — ABNORMAL LOW (ref 135–145)
Sodium: 132 mmol/L — ABNORMAL LOW (ref 135–145)

## 2019-08-12 LAB — POCT ACTIVATED CLOTTING TIME
Activated Clotting Time: 213 seconds
Activated Clotting Time: 224 seconds
Activated Clotting Time: 224 seconds
Activated Clotting Time: 230 seconds
Activated Clotting Time: 246 seconds
Activated Clotting Time: 246 seconds
Activated Clotting Time: 246 seconds
Activated Clotting Time: 246 seconds
Activated Clotting Time: 224 seconds
Activated Clotting Time: 175 seconds

## 2019-08-12 LAB — GLUCOSE, CAPILLARY
Glucose-Capillary: 188 mg/dL — ABNORMAL HIGH (ref 70–99)
Glucose-Capillary: 284 mg/dL — ABNORMAL HIGH (ref 70–99)
Glucose-Capillary: 242 mg/dL — ABNORMAL HIGH (ref 70–99)
Glucose-Capillary: 80 mg/dL (ref 70–99)

## 2019-08-12 LAB — CBC
HCT: 35.8 % — ABNORMAL LOW (ref 39.0–52.0)
HCT: 34.4 % — ABNORMAL LOW (ref 39.0–52.0)
Hemoglobin: 12.1 g/dL — ABNORMAL LOW (ref 13.0–17.0)
Hemoglobin: 11.2 g/dL — ABNORMAL LOW (ref 13.0–17.0)
MCH: 32.8 pg (ref 26.0–34.0)
MCH: 32.4 pg (ref 26.0–34.0)
MCHC: 33.8 g/dL (ref 30.0–36.0)
MCHC: 32.6 g/dL (ref 30.0–36.0)
MCV: 97 fL (ref 80.0–100.0)
MCV: 99.4 fL (ref 80.0–100.0)
Platelets: 188 10*3/uL (ref 150–400)
Platelets: 182 10*3/uL (ref 150–400)
RBC: 3.69 MIL/uL — ABNORMAL LOW (ref 4.22–5.81)
RBC: 3.46 MIL/uL — ABNORMAL LOW (ref 4.22–5.81)
RDW: 16.1 % — ABNORMAL HIGH (ref 11.5–15.5)
RDW: 16 % — ABNORMAL HIGH (ref 11.5–15.5)
WBC: 10 10*3/uL (ref 4.0–10.5)
WBC: 11.4 10*3/uL — ABNORMAL HIGH (ref 4.0–10.5)
nRBC: 0.3 % — ABNORMAL HIGH (ref 0.0–0.2)
nRBC: 0.2 % (ref 0.0–0.2)

## 2019-08-12 LAB — PROTIME-INR
INR: 1.2 (ref 0.8–1.2)
Prothrombin Time: 15.5 seconds — ABNORMAL HIGH (ref 11.4–15.2)

## 2019-08-12 LAB — MAGNESIUM: Magnesium: 2.7 mg/dL — ABNORMAL HIGH (ref 1.7–2.4)

## 2019-08-12 LAB — APTT: aPTT: >200 seconds (ref 24–36)

## 2019-08-12 LAB — BRAIN NATRIURETIC PEPTIDE: B Natriuretic Peptide: 353.8 pg/mL — ABNORMAL HIGH (ref 0.0–100.0)

## 2019-08-12 MED ORDER — INSULIN GLARGINE 100 UNIT/ML ~~LOC~~ SOLN
10.0000 [IU] | Freq: Two times a day (BID) | SUBCUTANEOUS | Status: DC
  Administered 2019-08-12 – 2019-08-17 (×9): 10 [IU] via SUBCUTANEOUS
  Filled 2019-08-12 (×11): qty 0.1

## 2019-08-12 MED ORDER — HEPARIN (PORCINE) 25000 UT/250ML-% IV SOLN
850.0000 [IU]/h | INTRAVENOUS | Status: DC
  Administered 2019-08-12 – 2019-08-13 (×2): 1100 [IU]/h via INTRAVENOUS
  Administered 2019-08-15: 10:00:00 900 [IU]/h via INTRAVENOUS
  Filled 2019-08-12 (×3): qty 250

## 2019-08-12 MED ORDER — CLOPIDOGREL BISULFATE 75 MG PO TABS
75.0000 mg | ORAL_TABLET | Freq: Every day | ORAL | Status: DC
  Administered 2019-08-13 – 2019-08-26 (×13): 75 mg via ORAL
  Filled 2019-08-12 (×15): qty 1

## 2019-08-12 MED ORDER — CLOPIDOGREL BISULFATE 300 MG PO TABS
600.0000 mg | ORAL_TABLET | Freq: Once | ORAL | Status: AC
  Administered 2019-08-12: 600 mg via ORAL
  Filled 2019-08-12: qty 2

## 2019-08-12 MED ORDER — ALBUTEROL SULFATE (2.5 MG/3ML) 0.083% IN NEBU
2.5000 mg | INHALATION_SOLUTION | RESPIRATORY_TRACT | Status: DC | PRN
  Administered 2019-08-14 – 2019-08-17 (×2): 2.5 mg via RESPIRATORY_TRACT
  Filled 2019-08-12 (×2): qty 3

## 2019-08-12 MED ORDER — SODIUM CHLORIDE 0.9% FLUSH
10.0000 mL | INTRAVENOUS | Status: DC | PRN
  Administered 2019-08-17: 10 mL
  Filled 2019-08-12: qty 40

## 2019-08-12 MED ORDER — INSULIN GLARGINE 100 UNIT/ML ~~LOC~~ SOLN
10.0000 [IU] | Freq: Every day | SUBCUTANEOUS | Status: DC
  Administered 2019-08-12: 10 [IU] via SUBCUTANEOUS
  Filled 2019-08-12: qty 0.1

## 2019-08-12 MED ORDER — HEPARIN (PORCINE) 25000 UT/250ML-% IV SOLN
1350.0000 [IU]/h | INTRAVENOUS | Status: DC
  Administered 2019-08-12: 1350 [IU]/h via INTRAVENOUS
  Filled 2019-08-12: qty 250

## 2019-08-12 MED ORDER — POTASSIUM CHLORIDE 10 MEQ/50ML IV SOLN
10.0000 meq | INTRAVENOUS | Status: AC
  Administered 2019-08-12 (×3): 10 meq via INTRAVENOUS
  Filled 2019-08-12 (×3): qty 50

## 2019-08-12 MED ORDER — SODIUM CHLORIDE 0.9% FLUSH
10.0000 mL | Freq: Two times a day (BID) | INTRAVENOUS | Status: DC
  Administered 2019-08-12 – 2019-08-16 (×6): 10 mL
  Administered 2019-08-17: 20 mL
  Administered 2019-08-17 – 2019-08-22 (×7): 10 mL

## 2019-08-12 MED ORDER — PANTOPRAZOLE SODIUM 40 MG PO TBEC
40.0000 mg | DELAYED_RELEASE_TABLET | Freq: Every day | ORAL | Status: DC
  Administered 2019-08-13 – 2019-08-26 (×13): 40 mg via ORAL
  Filled 2019-08-12 (×14): qty 1

## 2019-08-12 MED ORDER — COUMADIN BOOK
Freq: Once | Status: AC
  Administered 2019-08-12: 11:00:00
  Filled 2019-08-12: qty 1

## 2019-08-12 NOTE — Progress Notes (Signed)
NAME:  [name], MRN:  [id number], DOB:  08-Oct-1941, LOS: 6 ADMISSION DATE:  08/06/2019, CONSULTATION DATE:  10/26 REFERRING MD: [name]  , CHIEF COMPLAINT:  Acute respiratory failure   Brief History   78 year old male patient with prior stage IV CKD and severe underlying coronary artery disease.  Initially admitted to [facility] with non-ST elevation MI.  Underwent cardiac catheterization on 10/23 had small localized perforation of SVG during stent placement, transferred to [facility] because of this.  No further cardiac interventions completed however developed progressive renal failure and associated respiratory failure ultimately requiring critical care and nephrology consult on 10/26.  History of present illness    This is a pleasant 78 year old white male with a significant history of coronary artery disease as mentioned below.  He was admitted to [facility] initially with chief complaint of chest pain, elevated troponin and working diagnosis of non-ST elevation MI.  He went for cardiac catheterization at [facility], catheterization was notable for chronic occlusion of the SVG to OM with severe distal disease of the RCA which was felt to be the culprit lesion.  Intervention was complicated by focal perforation of the SVG during stent placement, contrast extravasation was controlled with balloon tamponade, however contained extravasation with still evident and because of this he was transferred to [facility].  As noted in history has a history of stage IV chronic kidney disease, on 10/24 he was started on bicarbonate drip as well as nitro infusion for increased hypertension.  Urine output was minimal.  Limited echocardiogram was obtained on 10/24 this was negative for pericardial effusion.  He did have recurrent chest pain the a.m. of 10/25 treated with nitroglycerin.  On 10/26 his renal function had continued to worsen.  Creatinine now up to 8.  Volume overloaded with worsening tachypnea,  cough, oxygen requirements.  Chest x-ray showing pulmonary edema.  Critical care asked to evaluate for ICU transfer as well as possible dialysis catheter placement and potential need for escalated pulmonary support  Past Medical History  Coronary artery disease status post CABG x3 vessels in 1997.  Carotid artery disease status post CEA x2, diabetes type 2, hypertension, hyperlipidemia, CKD stage IV, prior CVA.  Most recently non-ST elevation MI with PCI to the venous graft to RCA complicated by perforation of graft.  Significant Hospital Events   10/23 admitted from [facility] for possible need for intervention concerning SVG perforation.  Serum creatinine 5.38 at that time  10/23 through 10/26.  Progressive respiratory failure Portable chest x-ray with cardiomegaly, chronic elevated right hemidiaphragm, and vascular congestion creatinine up to 8.14, potassium 5.3, sodium down to 129 complaining of worsening shortness of breath pulmonary asked to evaluate Transfer to the ICU, hemodialysis catheter placed and CRRT initiated  10/27-escalating norepi requirements.  Unable to remove volume with CRRT  Consults:  Pulmonary 10/26 Nephrology 10/26  Procedures:  Lt radial a line 10/27 >>  Significant Diagnostic Tests:  10/24 Limited echocardiogram: EF 60 to 65% with normal left ventricular function normal right ventricular function no pericardial effusion  Micro Data:    Antimicrobials:    Interim history/subjective:  NAEO  Remains hyperglycemic will increase lantus   Objective   Blood pressure (!) 75/61, pulse (!) 38, temperature (!) 97.3 F (36.3 C), temperature source Oral, resp. rate (!) 23, height 6\' 2"  (1.88 m), weight 92.2 kg, SpO2 95 %.        Intake/Output Summary (Last 24 hours) at 08/12/2019 0752 Last data filed at 08/12/2019 0700  Gross per 24 hour  Intake 1080.96 ml  Output 5496 ml  Net -4415.04 ml   Filed Weights   08/10/19 0500 08/11/19 0600 08/12/19 0500   Weight: 96.1 kg 96.6 kg 92.2 kg    Examination: Gen:      Older adult M, reclined in bed, NAD  HEENT:  NCAT, pink mmm, trachea midline, anicteric sclera Lungs:    CTA bilaterally, symmetrical chest expansion, no accessory muscle sue  CV:         RRR s1s2 no rgm. Capillaryr efill < 3 seconds BUE BLE. No BLE edema  Abd:     Soft, round, ndnt, normoactive x4  Ext:    Symmetrical bulk and tone, no cyanosis, no clubbing.  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 Arterial line pressures reading significantly higher than cuff pressures  Continue ASA, ticagrelor Holding antihypertensives in setting of hypotension Continue dual antiplatelet therapy with aspirin and ticagrelor Holding blood pressure medications due to hypotension  Acute on CKD IV Suspect secondary to contrast superimposed on underlying CKD  Plan Volume removal per nephrology Trend electrolytes and replace PRN  DM II  Plan SSI + lantus (increasing lantus 10/29)  Anemia  -no evidence of active bleeding  Plan Trend CBC PRN   Best practice:  Diet: heart/carb healthy  Pain/Anxiety/Delirium protocol (if indicated): NA VAP protocol (if indicated): NA DVT prophylaxis: scd GI prophylaxis: PPI Glucose control: SSI Mobility: BR Code Status: Full code (does however make  clear that he does not want "prolonged support") Family Communication: Per primary  Disposition: ICU   Labs   CBC: Recent Labs  Lab 08/07/19 0230 08/08/19 0427 08/09/19 0853 08/10/19 1031 08/11/19 0500  WBC 8.8 8.8 8.7 20.7*   21.1* 15.9*  NEUTROABS  --   --   --  19.3*  --   HGB 10.3* 10.2* 11.3* 11.9*   11.9* 11.5*  HCT 31.4* 29.8* 34.5* 36.5*   35.8* 35.2*  MCV 97.8 96.8 98.0 100.3*   98.6 99.2  PLT 134* 122* 117* 179   174 130    Basic Metabolic Panel: Recent Labs  Lab 08/10/19 0247 08/10/19 1553 08/11/19 0500 08/11/19 1600 08/12/19 0410  NA 133* 134* 134* 133* 132*  K 4.5 4.4 3.6 3.5 3.1*  CL 101 102 101 100 97*  CO2 18* 17* 16* 22 21*  GLUCOSE 182* 188* 228* 209* 199*  BUN 60* 48* 39* 33* 24*  CREATININE 6.10* 4.54* 3.58* 3.06* 2.86*  CALCIUM 7.9* 7.9* 8.0* 7.9* 8.3*  MG 2.0  --  2.6*  --  2.7*  PHOS 5.0* 5.9* 5.0* 3.8 3.1   GFR: Estimated Creatinine Clearance: 24.4 mL/min (A) (by C-G formula based on SCr of 2.86 mg/dL (H)). Recent Labs  Lab 08/08/19 0427 08/09/19 0853 08/10/19 1031 08/11/19 0500  WBC 8.8 8.7 20.7*   21.1* 15.9*  Liver Function Tests: Recent Labs  Lab 08/10/19 0247 08/10/19 1553 08/11/19 0500 08/11/19 1600 08/12/19 0410  ALBUMIN 2.2* 2.1* 2.1* 2.2* 2.4*   No results for input(s): LIPASE, AMYLASE in the last 168 hours. No results for input(s): AMMONIA in the last 168 hours.  ABG No results found for: PHART, PCO2ART, PO2ART, HCO3, TCO2, ACIDBASEDEF, O2SAT   Coagulation Profile: No results for input(s): INR, PROTIME in the last 168 hours.  Cardiac Enzymes: No results for input(s): CKTOTAL, CKMB, CKMBINDEX, TROPONINI in the last 168 hours.  HbA1C: Hemoglobin A1C  Date/Time Value Ref Range Status  11/08/2011 02:52 AM 10.2 (H) 4.2 - 6.3 % Final    Comment:    The American Diabetes Association recommends that a primary goal of therapy should be <7% and that physicians should reevaluate the treatment regimen in patients  with HbA1c values consistently >8%.    Hgb A1c MFr Bld  Date/Time Value Ref Range Status  08/05/2019 04:39 AM 8.5 (H) 4.8 - 5.6 % Final    Comment:    (NOTE) Pre diabetes:          5.7%-6.4% Diabetes:              >6.4% Glycemic control for   <7.0% adults with diabetes   06/16/2018 9.8 (A) 4.0 - 6.0 Final    CBG: Recent Labs  Lab 08/11/19 0647 08/11/19 1134 08/11/19 1613 08/11/19 2201 08/12/19 0653  GLUCAP 210* 248* 196* 245* 188*    CRITICAL CARE Performed by: Cristal Generous   Total critical care time: 35 minutes  Critical care time was exclusive of separately billable procedures and treating other patients. Critical care was necessary to treat or prevent imminent or life-threatening deterioration.  Critical care was time spent personally by me on the following activities: development of treatment plan with patient and/or surrogate as well as nursing, discussions with consultants, evaluation of patient's response to treatment, examination of patient, obtaining history from patient or surrogate, ordering and performing treatments and interventions, ordering and review of laboratory studies, ordering and review of radiographic studies, pulse oximetry and re-evaluation of patient's condition.   Eliseo Gum MSN, AGACNP-BC [place] [phone] If no answer, [phone] 08/12/2019, 7:52 AM

## 2019-08-12 NOTE — Progress Notes (Signed)
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  Non-ST segment elevated MI I cardiac catheterization stent complicated by perforation.  Transferred to [facility] 08/09/2019.  Acute on chronic kidney injury Baseline serum creatinine 3.22 August 2018.  Patient initiated on CRRT 08/09/2019 -08/12/2019.  We will discontinue CRRT at this time and consider transition to intermittent hemodialysis  Blood pressure 130/52 pulse 56 temperature 97.8 O2 sats 94% 2 L nasal cannula  Sodium 132 potassium 3.1 chloride 97 CO2 21 glucose 99 BUN 24 creatinine 2.86 calcium 8.3 phosphorus 3.1 magnesium 2.7.  WBC 15.9 hemoglobin 11.5 platelets 210  Aspirin 81 mg daily atorvastatin 80 mg daily, levothyroxine 300 mcg daily Protonix 40 mg twice daily Brilinta 90 mg twice daily  IV Levophed  Ultrafilter 3 L 08/11/2019    Objective:  Vital signs in last 24 hours:  Temp:  [97.2 F (36.2 C)-98.7 F (37.1 C)] 97.6 F (36.4 C) (10/29 0824) Pulse Rate:  [29-92] 57 (10/29 0900) Resp:  [13-24] 22 (10/29 0900) BP: (78-130)/(47-61) 130/52 (10/29 0900) SpO2:  [89 %-98 %] 93 % (10/29 0900) Arterial Line BP: (96-197)/(32-58) 96/32 (10/29 0900) Weight:  [92.2 kg] 92.2 kg (10/29 0500)  Weight change: -4.4 kg Filed Weights   08/10/19 0500 08/11/19 0600 08/12/19 0500  Weight: 96.1 kg 96.6 kg 92.2 kg    Intake/Output: I/O last 3 completed shifts: In: 1224.9 [P.O.:900; I.V.:324.9] Out: 7209 [Urine:90; Other:6352]   Intake/Output this shift:  Total I/O In: 58.6 [I.V.:8.7; IV Piggyback:50] Out: 0    General: Awake, in moderate distress  HEENT: MMM [place] AT anicteric sclera Neck: Positive JVD, no adenopathy CV:  Heart RRR  Lungs: Audible rails bilaterally Abd:  abd SNT/ND with normal BS GU:  Bladder non-palpable Extremities: +2 bilateral  LE edema. Skin:  No skin rash Psych:  normal mood and affect Neuro:  no focal deficits   Basic Metabolic Panel: Recent Labs  Lab 08/10/19 0247 08/10/19 1553 08/11/19 0500 08/11/19 1600 08/12/19 0410  NA 133* 134* 134* 133* 132*  K 4.5 4.4 3.6 3.5 3.1*  CL 101 102 101 100 97*  CO2 18* 17* 16* 22 21*  GLUCOSE 182* 188* 228* 209* 199*  BUN 60* 48* 39* 33* 24*  CREATININE 6.10* 4.54* 3.58* 3.06* 2.86*  CALCIUM 7.9* 7.9* 8.0* 7.9* 8.3*  MG 2.0  --  2.6*  --  2.7*  PHOS 5.0* 5.9* 5.0* 3.8 3.1    Liver Function Tests: Recent Labs  Lab 08/10/19 0247 08/10/19 1553 08/11/19 0500 08/11/19 1600 08/12/19 0410  ALBUMIN 2.2* 2.1* 2.1* 2.2* 2.4*   No results for input(s): LIPASE, AMYLASE in the last 168 hours. No results for input(s): AMMONIA in the last 168 hours.  CBC: Recent Labs  Lab 08/07/19 0230 08/08/19 0427 08/09/19 0853 08/10/19 1031 08/11/19 0500  WBC 8.8 8.8 8.7 20.7*  21.1* 15.9*  NEUTROABS  --   --   --  19.3*  --   HGB 10.3* 10.2* 11.3* 11.9*  11.9* 11.5*  HCT 31.4* 29.8* 34.5* 36.5*  35.8* 35.2*  MCV 97.8 96.8 98.0 100.3*  98.6 99.2  PLT 134* 122* 117* 179  174 210    Cardiac Enzymes: No results for input(s): CKTOTAL, CKMB, CKMBINDEX, TROPONINI in the last 168 hours.  BNP: Invalid input(s): POCBNP  CBG: Recent Labs  Lab 08/11/19 0647 08/11/19 1134 08/11/19 1613 08/11/19 2201  08/12/19 [place]* 188*    Microbiology: Results for orders placed or performed during the hospital encounter of 08/04/19  MRSA PCR Screening     Status: None   Collection Time: 08/04/19 10:47 PM   Specimen: Nasal Mucosa; Nasopharyngeal  Result Value Ref Range Status   MRSA by PCR NEGATIVE NEGATIVE Final    Comment:        The GeneXpert MRSA Assay (FDA approved for NASAL specimens only), is one component of a comprehensive MRSA colonization surveillance program. It is not intended to diagnose MRSA infection nor to guide or monitor  treatment for MRSA infections. Performed at [facility], [address] [name], [place] [address]   SARS Coronavirus 2 by RT PCR (hospital order, performed in [facility] hospital lab) Nasopharyngeal Nasopharyngeal Swab     Status: None   Collection Time: 08/05/19 12:19 AM   Specimen: Nasopharyngeal Swab  Result Value Ref Range Status   SARS Coronavirus 2 NEGATIVE NEGATIVE Final    Comment: (NOTE) If result is NEGATIVE SARS-CoV-2 target nucleic acids are NOT DETECTED. The SARS-CoV-2 RNA is generally detectable in upper and lower  respiratory specimens during the acute phase of infection. The lowest  concentration of SARS-CoV-2 viral copies this assay can detect is 250  copies / mL. A negative result does not preclude SARS-CoV-2 infection  and should not be used as the sole basis for treatment or other  patient management decisions.  A negative result may occur with  improper specimen collection / handling, submission of specimen other  than nasopharyngeal swab, presence of viral mutation(s) within the  areas targeted by this assay, and inadequate number of viral copies  (<250 copies / mL). A negative result must be combined with clinical  observations, patient history, and epidemiological information. If result is POSITIVE SARS-CoV-2 target nucleic acids are DETECTED. The SARS-CoV-2 RNA is generally detectable in upper and lower  respiratory specimens dur ing the acute phase of infection.  Positive  results are indicative of active infection with SARS-CoV-2.  Clinical  correlation with patient history and other diagnostic information is  necessary to determine patient infection status.  Positive results do  not rule out bacterial infection or co-infection with other viruses. If result is PRESUMPTIVE POSTIVE SARS-CoV-2 nucleic acids MAY BE PRESENT.   A presumptive positive result was obtained on the submitted specimen  and confirmed on repeat testing.  While 2019 novel  coronavirus  (SARS-CoV-2) nucleic acids may be present in the submitted sample  additional confirmatory testing may be necessary for epidemiological  and / or clinical management purposes  to differentiate between  SARS-CoV-2 and other Sarbecovirus currently known to infect humans.  If clinically indicated additional testing with an alternate test  methodology [phone]) is advised. The SARS-CoV-2 RNA is generally  detectable in upper and lower respiratory sp ecimens during the acute  phase of infection. The expected result is Negative. Fact Sheet for Patients:  StrictlyIdeas.no Fact Sheet for Healthcare Providers: BankingDealers.co.za This test is not yet approved or cleared by the [place] FDA and has been authorized for detection and/or diagnosis of SARS-CoV-2 by FDA under an Emergency Use Authorization (EUA).  This EUA will remain in effect (meaning this test can be used) for the duration of the COVID-19 declaration under Section 564(b)(1) of the Act, 21 U.S.C. section 360bbb-3(b)(1), unless the authorization is terminated or revoked sooner. Performed at [facility], [address], [place], [place] [address]  Coagulation Studies: No results for input(s): LABPROT, INR in the last 72 hours.  Urinalysis: Recent Labs    08/09/19 1626  COLORURINE YELLOW  LABSPEC 1.020  PHURINE 6.5  GLUCOSEU >=500*  HGBUR LARGE*  BILIRUBINUR NEGATIVE  KETONESUR 15*  PROTEINUR >300*  NITRITE NEGATIVE  LEUKOCYTESUR TRACE*      Imaging: Dg Chest Port 1 View  Result Date: 08/10/2019 CLINICAL DATA:  Pulmonary vascular congestion. EXAM: PORTABLE CHEST 1 VIEW COMPARISON:  08/09/2019.  04/20/2016. 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 Cardiomegaly. Mild pulmonary venous congestion. Mild bibasilar atelectasis. Minimal right base pleural thickening again noted consistent scarring. No  pneumothorax. Prior thoracic vertebroplasty. IMPRESSION: 1.  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 2.  Prior CABG.  Cardiomegaly.  No pulmonary venous congestion. 3. Mild bibasilar atelectasis. Tiny right pleural effusion versus scarring. Electronically Signed   By: Elizabeth   On: 08/10/2019 12:15     Medications:   . sodium chloride    . heparin 1,350 Units/hr (08/12/19 0900)  . norepinephrine (LEVOPHED) Adult infusion 3 mcg/min (08/12/19 0900)  . potassium chloride 10 mEq (08/12/19 0908)   . aspirin EC  81 mg Oral Daily  . atorvastatin  80 mg Oral q1800  . Chlorhexidine Gluconate Cloth  6 each Topical Daily  . insulin aspart  0-15 Units Subcutaneous TID WC  . insulin aspart  0-5 Units Subcutaneous QHS  . insulin glargine  10 Units Subcutaneous Daily  . levothyroxine  300 mcg Oral Q0600  . pantoprazole (PROTONIX) IV  40 mg Intravenous Q12H  . ticagrelor  90 mg Oral BID   Place/Maintain arterial line **AND** sodium chloride, acetaminophen, albuterol, guaiFENesin, heparin, heparin, loperamide, nitroGLYCERIN, ondansetron (ZOFRAN) IV  Assessment/ Plan:   Chronic kidney disease stage IV followed at [facility] last renal ultrasound revealed medical renal disease both kidneys no evidence of renal mass or hydronephrosis.  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  Repeat ultrasound ordered 08/10/2019.  Urine microscopy revealed 21-50 WBCs per high-powered film 6-10 RBCs per high-powered film.  Greater than 300 mg proteinuria.  I would consider Foley trauma as a cause of his microscopic hematuria.  I doubt this represents acute interstitial nephritis or acute glomerulonephritis at this point.  Acute kidney injury most likely related related to hemodynamics contrast-induced nephropathy with marked worsening of renal function.  Possible thromboembolic disease.  Urgent dialysis required dialysis catheter placed 08/09/2019 with initiation of CRRT -continues to be oliguric  .  We will  stop CRRT 08/12/2019 and transition to hemodialysis.  He has improved hemodynamics  Acute congestive heart failure with most recent echocardiogram revealing normal ejection fraction followed by cardiology.  Will transition patient to intermittent hemodialysis  Atrial fibrillation now placed on heparin drip not on antihypertensives secondary to hypotension and bradycardia  NSTEMI status post PCI DES to SVG-RCA 27/78/2423 complicated by focal perforation of graft.  Appreciate assistance from cardiology  Hypotension   A-line seems to be monitoring blood pressures considerably higher than cuff.  Continue to wean Levophed  Lipidemia continue statin  Diabetes mellitus per primary team.   LOS: [place] @TODAY @9 :43 AM

## 2019-08-12 NOTE — Progress Notes (Signed)
Progress Note  Patient Name: [name] Date of Encounter: 08/12/2019  Primary Cardiologist: Dr. Nehemiah Massed  Subjective  O/N Events: Continuing A.fib. A-line bleeding oozing overnight. Able to wean down Neo to 69mcg  Mr.Ackerman was examined and evaluated at bedside this AM. He states overnight his a-line appears to be oozing. He denies any light-headedness, dizziness or fatigue. Denies any chest pain, palpitations.  Inpatient Medications    Scheduled Meds: . aspirin EC  81 mg Oral Daily  . atorvastatin  80 mg Oral q1800  . Chlorhexidine Gluconate Cloth  6 each Topical Daily  . insulin aspart  0-15 Units Subcutaneous TID WC  . insulin aspart  0-5 Units Subcutaneous QHS  . insulin glargine  5 Units Subcutaneous Daily  . levothyroxine  300 mcg Oral Q0600  . pantoprazole (PROTONIX) IV  40 mg Intravenous Q12H  . ticagrelor  90 mg Oral BID   Continuous Infusions: .  prismasol BGK 4/2.5 200 mL/hr at 08/11/19 2059  .  prismasol BGK 4/2.5 200 mL/hr at 08/11/19 2059  . sodium chloride    . heparin 10,000 units/ 20 mL infusion syringe 700 Units/hr (08/12/19 0710)  . norepinephrine (LEVOPHED) Adult infusion 8 mcg/min (08/11/19 1900)  . prismasol BGK 2/2.5 dialysis solution 2,000 mL/hr at 08/12/19 0440   PRN Meds: Place/Maintain arterial line **AND** sodium chloride, acetaminophen, albuterol, guaiFENesin, heparin, heparin, heparin, heparin, loperamide, nitroGLYCERIN, ondansetron (ZOFRAN) IV, sodium chloride   Vital Signs    Vitals:   08/12/19 0300 08/12/19 0400 08/12/19 0500 08/12/19 0600  BP:      Pulse: (!) 39 (!) 42 (!) 54 77  Resp: 16 15 17 13   Temp:  (!) 97.3 F (36.3 C)    TempSrc:  Oral    SpO2: 95% 96% 93% 96%  Weight:   92.2 kg   Height:        Intake/Output Summary (Last 24 hours) at 08/12/2019 0714 Last data filed at 08/12/2019 0700 Gross per 24 hour  Intake 1080.96 ml  Output 5496 ml  Net -4415.04 ml   Last 3 Weights 08/12/2019 08/11/2019 08/10/2019   Weight (lbs) 203 lb 4.2 oz 212 lb 15.4 oz 211 lb 13.8 oz  Weight (kg) 92.2 kg 96.6 kg 96.1 kg      Telemetry    Continued A.fib/flutter w/ ventricular bigeminy. HR ~70s - Personally Reviewed  ECG    No new tracing to review - Personally Reviewed  Physical Exam   Gen: Well-developed, obese, NAD HEENT: NCAT head, hearing intact, EOMi Neck: supple, ROM intact, dialysis catheter in place and functioning CV: Irregularly irregular, S1, S2 normal, No rubs, no murmurs Pulm: CTAB, no wheezing, no rales Abd: Soft, BS+, NTND Extm: ROM intact, Peripheral pulses intact, no pitting edema bilateral LEs, Left upper extremity A-line w/ notable dried blood around his arm and sheets Skin: Dry, Warm, normal turgor  Neuro: AAOx3  Labs    High Sensitivity Troponin:   Recent Labs  Lab 08/04/19 2250 08/05/19 0043 08/05/19 0818 08/05/19 1624  TROPONINIHS 471* 507* 1,266* 1,123*      Chemistry Recent Labs  Lab 08/11/19 0500 08/11/19 1600 08/12/19 0410  NA 134* 133* 132*  K 3.6 3.5 3.1*  CL 101 100 97*  CO2 16* 22 21*  GLUCOSE 228* 209* 199*  BUN 39* 33* 24*  CREATININE 3.58* 3.06* 2.86*  CALCIUM 8.0* 7.9* 8.3*  ALBUMIN 2.1* 2.2* 2.4*  GFRNONAA 15* 18* 20*  GFRAA 18* 21* 23*  ANIONGAP 17* 11 14  Hematology Recent Labs  Lab 08/09/19 0853 08/10/19 1031 08/11/19 0500  WBC 8.7 20.7*  21.1* 15.9*  RBC 3.52* 3.64*  3.63* 3.55*  HGB 11.3* 11.9*  11.9* 11.5*  HCT 34.5* 36.5*  35.8* 35.2*  MCV 98.0 100.3*  98.6 99.2  MCH 32.1 32.7  32.8 32.4  MCHC 32.8 32.6  33.2 32.7  RDW 16.0* 16.2*  16.2* 16.3*  PLT 117* 179  174 210    BNPNo results for input(s): BNP, PROBNP in the last 168 hours.   DDimer No results for input(s): DDIMER in the last 168 hours.   Radiology    Dg Chest Port 1 View  Result Date: 08/10/2019 CLINICAL DATA:  Pulmonary vascular congestion. EXAM: PORTABLE CHEST 1 VIEW COMPARISON:  08/09/2019.  04/20/2016. FINDINGS: Right IJ line noted with tip  over superior vena cava. Prior CABG. Cardiomegaly. Mild pulmonary venous congestion. Mild bibasilar atelectasis. Minimal right base pleural thickening again noted consistent scarring. No pneumothorax. Prior thoracic vertebroplasty. IMPRESSION: 1.  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 2.  Prior CABG.  Cardiomegaly.  No pulmonary venous congestion. 3. Mild bibasilar atelectasis. Tiny right pleural effusion versus scarring. Electronically Signed   By: Marcello Moores  Register   On: 08/10/2019 12:15    Cardiac Studies   Cardiac Catheterization and Percutaneous Coronary Intervention 10.23.2020  Conclusions: 1. See diagnostic angiogram by Dr. Nehemiah Massed for details of coronary/graft anatomy. 2. Severe disease involving SVG-RCA with 80-90% stenosis. 3. Successful PCI to SVG-RCA using Resolute Onyx 4.0 x 38 mm drug-eluting stent with 10% residual stenosis and TIMI-3 flow. 4. Focal perforation of the SVG-RCA during stent deployment. This was successfully contained with internal tamponade.  Recommendations: 1. Dual antiplatelet therapy with aspirin and ticagrelor for at least 12 months. 2. Aggressive secondary prevention. 3. Transfer to [facility] for ICU monitoring, given focal perforation of SVG-RCA. If the patient were to become hemodynamically unstable or develop worsening anemia, repeat angiography of SVG-RCA and possible covered stent placement would need to be considered. _____________   2D Echocardiogram 10.24.2020  IMPRESSIONS  1. Left ventricular ejection fraction, by visual estimation, is 60 to 65%. The left ventricle has normal function. Normal left ventricular size. There is no left ventricular hypertrophy. 2. Global right ventricle has normal systolic function.The right ventricular size is normal. No increase in right ventricular wall thickness. 3. Left atrial size was normal. 4. Right atrial size was norma 5. Severe mitral annular calcification. Mild  calcification of the anterior mitral valve leaflet(s).. No evidence of mitral valve regurgitation. Mild mitral stenosis. 6. The tricuspid valve is normal in structure. Tricuspid valve regurgitation is mild. 7. The aortic valve is tricuspid Aortic valve regurgitation is mild by color flow Doppler. Moderate aortic valve sclerosis/calcification without any evidence of aortic stenosis. 8. The pulmonic valve was normal in structure. Pulmonic valve regurgitation is not visualized by color flow Doppler. 9. Normal pulmonary artery systolic pressure. 10. The inferior vena cava is normal in size with greater than 50% respiratory variability, suggesting right atrial pressure of 3 mmHg. 11. No evidence of pericardial effusion. _____________   Patient Profile     78 y.o. male w/ a h/o CAD s/p CABG x 3 in 1997, carotid dzs s/p CEA x 2, DMII, HTN, HL, CKD V, and prior CVA, who was tx from [facility] following nstemi and PCI of the VG  RCA w/ perforation of the graft.  Assessment & Plan    A.Fib/Flutter w/ Slow ventricular rate Yesterday's telemetry shows In &  out A.fib. CHADS-VASC of 6 due to age, htn, hx of mi, chf, dm. No obvious block on EKG but conduction delays resulting in HR ~70s. Currently receiving triple therapy due to need for anticoagulation + recent NSTEMI requiring DES. Will see if he converts to sinus w/ resolution of his underlying issues. - C/w heparin gtt - Telemetry - EP eval for pacer  NSTEMI Presented w/ chest pain w/ Elevated HsTrop 507->1266 on admission. S/p PCI/DES to SVG-RCA on 72/90/21 complicated by focal perforation of the graft. Limited echo on 10/24 with normal EF and no effusion. Currently chest pain free. Still on Levo 29mcg - C/w Levophed gtt to keep MAP >65 - C/w nitrate PRN for chest pain - C/w asa, statin, Brilinta - Telemetry  Acute diastolic CHF Echo 11/55 showing EF 60-65%. Developed pulm edema on 08/06/19 improved w/ diuresis. Currently appears euvolemic.  Currently weight of 92.2kg appears to be less than last known dry weight (99kg) - Hold AV nodal blocking agents - Daily weights, I&Os - Diuresed via CRRT  Essential hypertension Am bp 86/60 - Hold home bp meds due to hypotension   Hyperlipidemia LDL 82.  - C/w atorvastatin 80mg  daily  CKD stage V Followed by [facility] nephro. Post-cath, worsening creatinine 6.31->8.14 CRRT -> 6.1->3.6. 4.4L out via CRRT overnight. Nephro planning on pausing CRRT to see if any recovery of native renal fx - Management per nephro  DM type II on insulin -A1c 8.5. Continue SSI while in hospital.   For questions or updates, please contact [place] Please consult [email or web address] for contact info under     Signed, Mosetta Anis, MD  08/12/2019, 7:14 AM   PGY-2, [place] Pager: [phone]  Attending attestation to follow

## 2019-08-12 NOTE — Discharge Instructions (Addendum)
[facility]  Discharge Instructions  AV Fistula or Graft Surgery for Dialysis Access  Please refer to the following instructions for your post-procedure care. Your surgeon or physician assistant will discuss any changes with you.  Activity  You may drive the day following your surgery, if you are comfortable and no longer taking prescription pain medication. Resume full activity as the soreness in your incision resolves.  Bathing/Showering  You may shower after you go home. Keep your incision dry for 48 hours. Do not soak in a bathtub, hot tub, or swim until the incision heals completely. You may not shower if you have a hemodialysis catheter.  Incision Care  Clean your incision with mild soap and water after 48 hours. Pat the area dry with a clean towel. You do not need a bandage unless otherwise instructed. Do not apply any ointments or creams to your incision. You may have skin glue on your incision. Do not peel it off. It will come off on its own in about one week. Your arm may swell a bit after surgery. To reduce swelling use pillows to elevate your arm so it is above your heart. Your doctor will tell you if you need to lightly wrap your arm with an ACE bandage.  Diet  Resume your normal diet. There are not special food restrictions following this procedure. In order to heal from your surgery, it is CRITICAL to get adequate nutrition. Your body requires vitamins, minerals, and protein. Vegetables are the best source of vitamins and minerals. Vegetables also provide the perfect balance of protein. Processed food has little nutritional value, so try to avoid this.  Medications  Resume taking all of your medications. If your incision is causing pain, you may take over-the counter pain relievers such as acetaminophen (Tylenol). If you were prescribed a stronger pain medication, please be aware these medications can cause nausea and constipation.  Prevent nausea by taking the medication with a snack or meal. Avoid constipation by drinking plenty of fluids and eating foods with high amount of fiber, such as fruits, vegetables, and grains. Do not take Tylenol if you are taking prescription pain medications.     Follow up Your surgeon may want to see you in the office following your access surgery. If so, this will be arranged at the time of your surgery.  Please call us immediately for any of the following conditions:  Increased pain, redness, drainage (pus) from your incision site Fever of 101 degrees or higher Severe or worsening pain at your incision site Hand pain or numbness.  Reduce your risk of vascular disease:  Stop smoking. If you would like help, call [facility] at 1-800-QUIT-NOW 684-336-3669) or [place] at [place] your cholesterol Maintain a desired weight Control your diabetes Keep your blood pressure down  Dialysis  It will take several weeks to several months for your new dialysis access to be ready for use. Your surgeon will determine when it is OK to use it. Your nephrologist will continue to direct your dialysis. You can continue to use your Permcath until your new access is ready for use.  If you have any questions, please call the office at [phone].               Information about your medication: Plavix (anti-platelet agent)  Generic Name (Brand): clopidogrel (Plavix), once daily medication  PURPOSE: You are taking this medication along with aspirin to lower your chance of having  a heart attack, stroke, or blood clots in your heart stent. These can be fatal. Brilinta and aspirin help prevent platelets from sticking together and forming a clot that can block an artery or your stent.   Common SIDE EFFECTS you may experience include: bruising or bleeding more easily, shortness of breath  Do not stop taking PLAVIX without talking to the doctor who prescribes it for you.  People who are treated with a stent and stop taking Plavix too soon, have a higher risk of getting a blood clot in the stent, having a heart attack, or dying. If you stop Plavix because of bleeding, or for other reasons, your risk of a heart attack or stroke may increase.   Tell all of your doctors and dentists that you are taking Plavix. They should talk to the doctor who prescribed plavix for you before you have any surgery or invasive procedure.   Contact your health care provider if you experience: severe or uncontrollable bleeding, pink/red/brown urine, vomiting blood or vomit that looks like "coffee grounds", red or black stools (looks like tar), coughing up blood or blood clots ----------------------------------------------------------------------------------------------------------------------  De-escalation of Triple Therapy Post-PCI  Underwent cardiac catheterization with drug-eluting stent on 08/06/19. Plan for triple therapy with aspirin 81 mg and clopidogrel (Plavix) in addition to oral anticoagulation using warfarin (Coumadin). Plan to discontinue aspirin on 09/06/19.    Information on my medicine - Coumadin   (Warfarin)  This medication education was reviewed with me or my healthcare representative as part of my discharge preparation.  Why was Coumadin prescribed for you? Coumadin was prescribed for you because you have a blood clot or a medical condition that can cause an increased risk of forming blood clots. Blood clots can cause serious health problems by blocking the flow of blood to the heart, lung, or brain. Coumadin can prevent harmful blood clots from forming. As a reminder your indication for Coumadin is:   Stroke Prevention Because Of Atrial Fibrillation  What test will check on my response to Coumadin? While on Coumadin (warfarin) you will need to have an INR test regularly to ensure that your dose is keeping you in the desired range. The INR (international normalized  ratio) number is calculated from the result of the laboratory test called prothrombin time (PT).  If an INR APPOINTMENT HAS NOT ALREADY BEEN MADE FOR YOU please schedule an appointment to have this lab work done by your health care provider within 7 days. Your INR goal is usually a number between:  2 to 3 or your provider may give you a more narrow range like 2-2.5.  Ask your health care provider during an office visit what your goal INR is.  What  do you need to  know  About  COUMADIN? Take Coumadin (warfarin) exactly as prescribed by your healthcare provider about the same time each day.  DO NOT stop taking without talking to the doctor who prescribed the medication.  Stopping without other blood clot prevention medication to take the place of Coumadin may increase your risk of developing a new clot or stroke.  Get refills before you run out.  What do you do if you miss a dose? If you miss a dose, take it as soon as you remember on the same day then continue your regularly scheduled regimen the next day.  Do not take two doses of Coumadin at the same time.  Important Safety Information A possible side effect of Coumadin (Warfarin) is an increased risk  of bleeding. You should call your healthcare provider right away if you experience any of the following: ? Bleeding from an injury or your nose that does not stop. ? Unusual colored urine (red or dark brown) or unusual colored stools (red or black). ? Unusual bruising for unknown reasons. ? A serious fall or if you hit your head (even if there is no bleeding).  Some foods or medicines interact with Coumadin (warfarin) and might alter your response to warfarin. To help avoid this: ? Eat a balanced diet, maintaining a consistent amount of Vitamin K. ? Notify your provider about major diet changes you plan to make. ? Avoid alcohol or limit your intake to 1 drink for women and 2 drinks for men per day. (1 drink is 5 oz. wine, 12 oz. beer, or 1.5  oz. liquor.)  Make sure that ANY health care provider who prescribes medication for you knows that you are taking Coumadin (warfarin).  Also make sure the healthcare provider who is monitoring your Coumadin knows when you have started a new medication including herbals and non-prescription products.  Coumadin (Warfarin)  Major Drug Interactions  Increased Warfarin Effect Decreased Warfarin Effect  Alcohol (large quantities) Antibiotics (esp. Septra/Bactrim, Flagyl, Cipro) Amiodarone (Cordarone) Aspirin (ASA) Cimetidine (Tagamet) Megestrol (Megace) NSAIDs (ibuprofen, naproxen, etc.) Piroxicam (Feldene) Propafenone (Rythmol SR) Propranolol (Inderal) Isoniazid (INH) Posaconazole (Noxafil) Barbiturates (Phenobarbital) Carbamazepine (Tegretol) Chlordiazepoxide (Librium) Cholestyramine (Questran) Griseofulvin Oral Contraceptives Rifampin Sucralfate (Carafate) Vitamin K   Coumadin (Warfarin) Major Herbal Interactions  Increased Warfarin Effect Decreased Warfarin Effect  Garlic Ginseng Ginkgo biloba Coenzyme Q10 Green tea St. Johns wort    Coumadin (Warfarin) FOOD Interactions  Eat a consistent number of servings per week of foods HIGH in Vitamin K (1 serving =  cup)  Collards (cooked, or boiled & drained) Kale (cooked, or boiled & drained) Mustard greens (cooked, or boiled & drained) Parsley *serving size only =  cup Spinach (cooked, or boiled & drained) Swiss chard (cooked, or boiled & drained) Turnip greens (cooked, or boiled & drained)  Eat a consistent number of servings per week of foods MEDIUM-HIGH in Vitamin K (1 serving = 1 cup)  Asparagus (cooked, or boiled & drained) Broccoli (cooked, boiled & drained, or raw & chopped) Brussel sprouts (cooked, or boiled & drained) *serving size only =  cup Lettuce, raw (green leaf, endive, romaine) Spinach, raw Turnip greens, raw & chopped   These websites have more information on Coumadin (warfarin):   FailFactory.se; VeganReport.com.au;      Wishing you quality time with your family in the coming days/weeks.

## 2019-08-12 NOTE — Progress Notes (Signed)
ANTICOAGULATION CONSULT NOTE - Initial Consult  Pharmacy Consult for Heparin and warfarin Indication: atrial fibrillation  Allergies  Allergen Reactions  . Ace Inhibitors Other (See Comments)    Reaction:  Raises potassium   . Quinapril Rash and Other (See Comments)    hyperkalemia    Patient Measurements: Height: 6\' 2"  (188 cm) Weight: 203 lb 4.2 oz (92.2 kg) IBW/kg (Calculated) : 82.2 Heparin Dosing Weight: 90.2 kg  Vital Signs: BP: 124/48 (10/29 1900) Pulse Rate: 55 (10/29 1900)  Labs: Recent Labs    08/10/19 0247  08/11/19 0500 08/11/19 1600 08/12/19 0410 08/12/19 0948 08/12/19 1341 08/12/19 1505 08/12/19 1616 08/12/19 1959  HGB  --    < > 11.5*  --   --  12.1* 10.9* 11.2*  --   --   HCT  --    < > 35.2*  --   --  35.8* 32.0* 34.4*  --   --   PLT  --    < > 210  --   --  188  --  182  --   --   APTT 78*  --  >200*  --  >200*  --   --   --   --   --   LABPROT  --   --   --   --   --  15.5*  --   --   --   --   INR  --   --   --   --   --  1.2  --   --   --   --   HEPARINUNFRC  --   --   --   --   --   --   --   --  1.08* 1.04*  CREATININE 6.10*   < > 3.58* 3.06* 2.86*  --   --  3.61*  --   --    < > = values in this interval not displayed.    Estimated Creatinine Clearance: 19.3 mL/min (A) (by C-G formula based on SCr of 3.61 mg/dL (H)).   Medical History: Past Medical History:  Diagnosis Date  . Blood transfusion without reported diagnosis    patient unaware of receiving blood unless it was during surgery and he was not told  . Cancer [facility]) 2001   Colon resection  . CAP (community acquired pneumonia) 08/11/2017  . Carotid stenosis, symptomatic, with infarction ([place]) 07/17/2017  . Chronic kidney disease 11/2017   stage IV CKD per nephrologist.  . Complication of anesthesia    raspy voice since carotid endarterectomy 07/17/17. paralysis of vocal chords  . Coronary artery disease   . Diabetes mellitus without complication ([place])   . Dysrhythmia    patient unaware of any irregular heart rhythms  . Hyperlipidemia   . Hypertension   . Hypothyroidism   . Kidney mass 2019  . Myocardial infarction ([place]) 1997  . Peripheral vascular disease ([place])   . Pneumonia 2014, 2018   developed after surgery 2018  . Stroke ([place]) 07/2017   mild stroke and then had carotid endarterectomy  . Thyroid disease   . Wears dentures    full upper  . Weight loss 2019   patient has lost over 60 pounds since 07/2017.     Medications:  Scheduled:  . aspirin EC  81 mg Oral Daily  . atorvastatin  80 mg Oral q1800  . Chlorhexidine Gluconate Cloth  6 each Topical Daily  . clopidogrel  600 mg Oral Once  Followed by  . [START ON 08/13/2019] clopidogrel  75 mg Oral Daily  . insulin aspart  0-15 Units Subcutaneous TID WC  . insulin aspart  0-5 Units Subcutaneous QHS  . insulin glargine  10 Units Subcutaneous BID  . levothyroxine  300 mcg Oral Q0600  . [START ON 08/13/2019] pantoprazole  40 mg Oral Daily    Assessment: 69 YOM presented with NSTEMI, s/p PCI with DES to SVG-RCA on 17/00, complicated by focal perforation of the graft. Pt on DAPT w/ ASA and Brilinta, now in Afib and requiring anticoagulation. No AC prior to admission, pt has been receiving heparin through CRRT since 10/26 which is now off.  Initial heparin level supratherapeutic (confirmed on redraw).    Goal of Therapy:  INR 2-3 Heparin level 0.3-0.7 units/ml Monitor platelets by anticoagulation protocol: Yes   Plan:  -Hold heparin x1hr -Restart heparin 1100 units/hr -Recheck heparin level in [place], PharmD, BCPS Clinical Pharmacist [phone] Please check AMION for all [place] numbers 08/12/2019

## 2019-08-12 NOTE — Progress Notes (Addendum)
Notified by nursing staff regarding worsening dyspnea. Examined and evaluated at bedside. [name] was observed complaining of difficulty catching his breath. Denies any cough, chest pain, palpitations.   Exam shows  Gen: Alert, Appears  uncomfortable Cardiac: Irregular, brady, S1, S2 wnl, No murmurs, rubs Pulm: left sided rales at the base. CTAB otherwise Abd: NTND, BS+ Extm: No edema  Unclear reason for subjective dyspnea. O2 sat at bedside shows 98% on 4L. Possibly due to pulmonary edema. - ABG - Chest X-ray  ABG shows respiratory alkalosis at pH 7.4, pCO2 31, pO2 70, HCO3 20.7 Chest X-ray shows improved vascular congestion  Re-evaluated at bedside with Dr.Hilty. Mentions interval improvement in subjective dyspnea. Received nebulizer treatment about an hour ago. Discussed with respiratory therapy to provide additional treatments as needed.

## 2019-08-12 NOTE — Progress Notes (Addendum)
ANTICOAGULATION CONSULT NOTE - Initial Consult  Pharmacy Consult for Heparin and warfarin Indication: atrial fibrillation  Allergies  Allergen Reactions  . Ace Inhibitors Other (See Comments)    Reaction:  Raises potassium   . Quinapril Rash and Other (See Comments)    hyperkalemia    Patient Measurements: Height: 6\' 2"  (188 cm) Weight: 203 lb 4.2 oz (92.2 kg) IBW/kg (Calculated) : 82.2 Heparin Dosing Weight: 90.2 kg  Vital Signs: Temp: 97.3 F (36.3 C) (10/29 0400) Temp Source: Oral (10/29 0400) Pulse Rate: 38 (10/29 0700)  Labs: Recent Labs    08/09/19 0853  08/10/19 0247 08/10/19 1031  08/11/19 0500 08/11/19 1600 08/12/19 0410  HGB 11.3*  --   --  11.9*  11.9*  --  11.5*  --   --   HCT 34.5*  --   --  36.5*  35.8*  --  35.2*  --   --   PLT 117*  --   --  179  174  --  210  --   --   APTT  --   --  78*  --   --  >200*  --  >200*  CREATININE 8.14*   < > 6.10*  --    < > 3.58* 3.06* 2.86*   < > = values in this interval not displayed.    Estimated Creatinine Clearance: 24.4 mL/min (A) (by C-G formula based on SCr of 2.86 mg/dL (H)).   Medical History: Past Medical History:  Diagnosis Date  . Blood transfusion without reported diagnosis    patient unaware of receiving blood unless it was during surgery and he was not told  . Cancer [facility]) 2001   Colon resection  . CAP (community acquired pneumonia) 08/11/2017  . Carotid stenosis, symptomatic, with infarction ([place]) 07/17/2017  . Chronic kidney disease 11/2017   stage IV CKD per nephrologist.  . Complication of anesthesia    raspy voice since carotid endarterectomy 07/17/17. paralysis of vocal chords  . Coronary artery disease   . Diabetes mellitus without complication ([place])   . Dysrhythmia    patient unaware of any irregular heart rhythms  . Hyperlipidemia   . Hypertension   . Hypothyroidism   . Kidney mass 2019  . Myocardial infarction ([place]) 1997  . Peripheral vascular disease ([place])   . Pneumonia  2014, 2018   developed after surgery 2018  . Stroke ([place]) 07/2017   mild stroke and then had carotid endarterectomy  . Thyroid disease   . Wears dentures    full upper  . Weight loss 2019   patient has lost over 60 pounds since 07/2017.     Medications:  Scheduled:  . aspirin EC  81 mg Oral Daily  . atorvastatin  80 mg Oral q1800  . Chlorhexidine Gluconate Cloth  6 each Topical Daily  . insulin aspart  0-15 Units Subcutaneous TID WC  . insulin aspart  0-5 Units Subcutaneous QHS  . insulin glargine  10 Units Subcutaneous Daily  . levothyroxine  300 mcg Oral Q0600  . pantoprazole (PROTONIX) IV  40 mg Intravenous Q12H  . ticagrelor  90 mg Oral BID    Assessment: 15 YOM presented with NSTEMI, s/p PCI with DES to SVG-RCA on 55/97, complicated by focal perforation of the graft. Pt on DAPT w/ ASA and Brilinta, now in Afib and requiring anticoagulation.  - No AC prior to admission - Pt has been receiving heparin through CRRT since 10/26 - Some bleeding from A-line noted  overnight - Hgb a little low but stable at 11.5 - Baseline INR 1.2   Goal of Therapy:  INR 2-3 Heparin level 0.3-0.7 units/ml Monitor platelets by anticoagulation protocol: Yes   Plan:  Start heparin infusion at 1350 units/hr  Start warfarin 5 mg daily Check heparin level in 8 hours Daily heparin level, INR, and CBC   Vallery Sa, PharmD Candidate 08/12/2019,8:13 AM

## 2019-08-13 ENCOUNTER — Inpatient Hospital Stay (HOSPITAL_COMMUNITY): Payer: Medicare HMO

## 2019-08-13 DIAGNOSIS — R040 Epistaxis: Secondary | ICD-10-CM

## 2019-08-13 DIAGNOSIS — Z992 Dependence on renal dialysis: Secondary | ICD-10-CM

## 2019-08-13 DIAGNOSIS — N186 End stage renal disease: Secondary | ICD-10-CM

## 2019-08-13 LAB — CBC
HCT: 30.7 % — ABNORMAL LOW (ref 39.0–52.0)
Hemoglobin: 10.1 g/dL — ABNORMAL LOW (ref 13.0–17.0)
MCH: 32.9 pg (ref 26.0–34.0)
MCHC: 32.9 g/dL (ref 30.0–36.0)
MCV: 100 fL (ref 80.0–100.0)
Platelets: 120 10*3/uL — ABNORMAL LOW (ref 150–400)
RBC: 3.07 MIL/uL — ABNORMAL LOW (ref 4.22–5.81)
RDW: 15.7 % — ABNORMAL HIGH (ref 11.5–15.5)
WBC: 11.2 10*3/uL — ABNORMAL HIGH (ref 4.0–10.5)
nRBC: 0 % (ref 0.0–0.2)

## 2019-08-13 LAB — RENAL FUNCTION PANEL
Albumin: 2.1 g/dL — ABNORMAL LOW (ref 3.5–5.0)
Albumin: 2.2 g/dL — ABNORMAL LOW (ref 3.5–5.0)
Anion gap: 12 (ref 5–15)
Anion gap: 14 (ref 5–15)
BUN: 45 mg/dL — ABNORMAL HIGH (ref 8–23)
BUN: 56 mg/dL — ABNORMAL HIGH (ref 8–23)
CO2: 22 mmol/L (ref 22–32)
CO2: 19 mmol/L — ABNORMAL LOW (ref 22–32)
Calcium: 8.4 mg/dL — ABNORMAL LOW (ref 8.9–10.3)
Calcium: 8.3 mg/dL — ABNORMAL LOW (ref 8.9–10.3)
Chloride: 100 mmol/L (ref 98–111)
Chloride: 97 mmol/L — ABNORMAL LOW (ref 98–111)
Creatinine, Ser: 5.26 mg/dL — ABNORMAL HIGH (ref 0.61–1.24)
Creatinine, Ser: 6.27 mg/dL — ABNORMAL HIGH (ref 0.61–1.24)
GFR calc Af Amer: 11 mL/min — ABNORMAL LOW (ref >60)
GFR calc Af Amer: 9 mL/min — ABNORMAL LOW (ref >60)
GFR calc non Af Amer: 10 mL/min — ABNORMAL LOW (ref >60)
GFR calc non Af Amer: 8 mL/min — ABNORMAL LOW (ref >60)
Glucose, Bld: 119 mg/dL — ABNORMAL HIGH (ref 70–99)
Glucose, Bld: 333 mg/dL — ABNORMAL HIGH (ref 70–99)
Phosphorus: 3.9 mg/dL (ref 2.5–4.6)
Phosphorus: 3.7 mg/dL (ref 2.5–4.6)
Potassium: 3.5 mmol/L (ref 3.5–5.1)
Potassium: 3.7 mmol/L (ref 3.5–5.1)
Sodium: 134 mmol/L — ABNORMAL LOW (ref 135–145)
Sodium: 130 mmol/L — ABNORMAL LOW (ref 135–145)

## 2019-08-13 LAB — GLUCOSE, CAPILLARY
Glucose-Capillary: 123 mg/dL — ABNORMAL HIGH (ref 70–99)
Glucose-Capillary: 200 mg/dL — ABNORMAL HIGH (ref 70–99)
Glucose-Capillary: 343 mg/dL — ABNORMAL HIGH (ref 70–99)
Glucose-Capillary: 94 mg/dL (ref 70–99)

## 2019-08-13 LAB — HEPARIN LEVEL (UNFRACTIONATED)
Heparin Unfractionated: 1 IU/mL — ABNORMAL HIGH (ref 0.30–0.70)
Heparin Unfractionated: 0.53 IU/mL (ref 0.30–0.70)

## 2019-08-13 LAB — PROTIME-INR
INR: 1.3 — ABNORMAL HIGH (ref 0.8–1.2)
Prothrombin Time: 16.1 seconds — ABNORMAL HIGH (ref 11.4–15.2)

## 2019-08-13 LAB — APTT: aPTT: >200 seconds (ref 24–36)

## 2019-08-13 LAB — HEPATITIS B SURFACE ANTIGEN: Hepatitis B Surface Ag: NONREACTIVE

## 2019-08-13 LAB — MAGNESIUM: Magnesium: 2.7 mg/dL — ABNORMAL HIGH (ref 1.7–2.4)

## 2019-08-13 MED ORDER — HEPARIN SODIUM (PORCINE) 1000 UNIT/ML DIALYSIS: 1000.0000 [IU] | INTRAMUSCULAR | Status: DC | PRN

## 2019-08-13 MED ORDER — ALTEPLASE 2 MG IJ SOLR: 2.0000 mg | Freq: Once | INTRAMUSCULAR | Status: DC | PRN

## 2019-08-13 MED ORDER — CHLORHEXIDINE GLUCONATE CLOTH 2 % EX PADS
6.0000 | MEDICATED_PAD | Freq: Every day | CUTANEOUS | Status: DC
  Administered 2019-08-13 – 2019-08-15 (×3): 6 via TOPICAL

## 2019-08-13 MED ORDER — RESOURCE THICKENUP CLEAR PO POWD
ORAL | Status: DC | PRN
  Filled 2019-08-13: qty 125

## 2019-08-13 MED ORDER — WARFARIN SODIUM 5 MG PO TABS
5.0000 mg | ORAL_TABLET | Freq: Once | ORAL | Status: AC
  Administered 2019-08-13: 17:00:00 5 mg via ORAL
  Filled 2019-08-13: qty 1

## 2019-08-13 MED ORDER — SODIUM CHLORIDE 0.9 % IV SOLN: 100.0000 mL | INTRAVENOUS | Status: DC | PRN

## 2019-08-13 MED ORDER — COUMADIN BOOK
Freq: Once | Status: DC
  Filled 2019-08-13: qty 1

## 2019-08-13 MED ORDER — ACETAMINOPHEN 325 MG PO TABS
ORAL_TABLET | ORAL | Status: AC
  Administered 2019-08-13: 650 mg via ORAL
  Filled 2019-08-13: qty 2

## 2019-08-13 MED ORDER — WARFARIN - PHARMACIST DOSING INPATIENT
Freq: Every day | Status: DC
  Administered 2019-08-13: 17:00:00

## 2019-08-13 MED ORDER — LIDOCAINE HCL (PF) 1 % IJ SOLN: 5.0000 mL | INTRAMUSCULAR | Status: DC | PRN

## 2019-08-13 MED ORDER — HEPARIN SODIUM (PORCINE) 1000 UNIT/ML IJ SOLN
INTRAMUSCULAR | Status: AC
  Administered 2019-08-13: 1000 [IU] via INTRAVENOUS_CENTRAL
  Filled 2019-08-13: qty 3

## 2019-08-13 MED ORDER — LIDOCAINE-PRILOCAINE 2.5-2.5 % EX CREA: 1.0000 "application " | TOPICAL_CREAM | CUTANEOUS | Status: DC | PRN

## 2019-08-13 MED ORDER — SALINE SPRAY 0.65 % NA SOLN
1.0000 | NASAL | Status: DC | PRN
  Filled 2019-08-13: qty 44

## 2019-08-13 MED ORDER — PENTAFLUOROPROP-TETRAFLUOROETH EX AERO: 1.0000 "application " | INHALATION_SPRAY | CUTANEOUS | Status: DC | PRN

## 2019-08-13 MED ORDER — STARCH (THICKENING) PO POWD
ORAL | Status: DC | PRN
  Filled 2019-08-13: qty 227

## 2019-08-13 NOTE — Progress Notes (Signed)
Pt. Left forearm noted with bruising and moderate amount of blood on sheets and gown. Heparin drip paused. Pt. Denies pain at IV site. No swelling noted. Dr. Georgette Shell paged and made aware. Order to pause heparin drip x2 hours then restart if bleeding has stopped. Primary nurse to call MD if bleeding has not stopped in 2 hours. Will notify primary nurse

## 2019-08-13 NOTE — Progress Notes (Signed)
Dr. Justin Mend made aware of bleeding events during HD, ok to stop HD tx and return pt. To room. Primary nurse Colletta Maryland also made aware pt. Stable. Treatment ended.

## 2019-08-13 NOTE — Progress Notes (Signed)
Patient arrived the unit from [facility] on a hospital bed, assessment completed see flowsheet, placed on tele ccmd notified, patient oriented  to room and staff, bed in lowest position call bell within reach will continue to monitor.

## 2019-08-13 NOTE — Evaluation (Signed)
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  Transferred to [facility] 08/09/2019.  Acute on chronic kidney injury sustained requiring CRRT 08/09/2019-08/12/2019.  Clinical Impression  Pt admitted with above. Pt sleeping upon arrival, with very brief brady episode to 38 bpm; once aroused, HR stable in 50-60's. Pt presents with decreased functional mobility secondary to generalized weakness, balance impairments, decreased cardiovascular endurance. Pt able to perform warm up lower extremity exercises prior to mobility. Requiring moderate assist for transfers, able to walk to chair with Rollator and min assist. Prior to admission, pt lives with his wife and is independent; enjoys doing yard work. Recommending CIR to maximize functional independence and decrease caregiver burden.     Follow Up Recommendations CIR;Supervision/Assistance - 24 hour    Equipment Recommendations  Rolling walker with 5" wheels;3in1 (PT)    Recommendations for Other Services Rehab consult;OT consult     Precautions / Restrictions Precautions Precautions: Fall;Other (comment) Precaution Comments: bradycardic Restrictions Weight Bearing Restrictions: No      Mobility  Bed Mobility Overal bed mobility: Needs Assistance Bed Mobility: Supine to Sit     Supine to sit: Min guard     General bed mobility comments: Use of bed rail, min guard assist for safety  Transfers Overall transfer level: Needs assistance Equipment used: 4-wheeled walker Transfers: Sit to/from Stand Sit to Stand: Mod assist         General  transfer comment: ModA to boost to stand from edge of bed to Rollator.   Ambulation/Gait Ambulation/Gait assistance: Min Web designer (Feet): 3 Feet Assistive device: 4-wheeled walker Gait Pattern/deviations: Step-through pattern;Decreased stride length;Trunk flexed     General Gait Details: Pivotal steps from bed to chair, difficulty with sequencing requiring cues for safety  Stairs            Wheelchair Mobility    Modified Rankin (Stroke Patients Only)       Balance Overall balance assessment: Needs assistance Sitting-balance support: Feet supported Sitting balance-Leahy Scale: Fair     Standing balance support: Bilateral upper extremity supported Standing balance-Leahy Scale: Poor Standing balance comment: reliant on external support                             Pertinent Vitals/Pain Pain Assessment: No/denies pain    Home Living Family/patient expects to be discharged to:: Private residence Living Arrangements: Spouse/significant other Available Help at Discharge: Family Type of Home: Mobile home Home Access: Stairs to enter Entrance Stairs-Rails: Psychiatric nurse of Steps: 3 Home Layout: One level        Prior Function Level of Independence: Independent         Comments: Drives, does yard work     Journalist, newspaper        Extremity/Trunk Assessment   Upper Extremity Assessment Upper Extremity Assessment: Generalized weakness    Lower Extremity Assessment Lower Extremity Assessment: Generalized weakness    Cervical / Trunk Assessment Cervical / Trunk Assessment: Kyphotic  Communication   Communication: No difficulties  Cognition Arousal/Alertness: Awake/alert Behavior During Therapy: WFL for tasks assessed/performed Overall Cognitive Status: Within Functional Limits for tasks assessed  General Comments      Exercises General Exercises -  Lower Extremity Long Arc Quad: Both;10 reps;Seated Hip Flexion/Marching: Both;10 reps;Seated Heel Raises: Both;15 reps;Seated   Assessment/Plan    PT Assessment Patient needs continued PT services  PT Problem List Decreased strength;Decreased activity tolerance;Decreased balance;Decreased mobility       PT Treatment Interventions DME instruction;Gait training;Stair training;Functional mobility training;Therapeutic activities;Therapeutic exercise;Balance training;Patient/family education    PT Goals (Current goals can be found in the Care Plan section)  Acute Rehab PT Goals Patient Stated Goal: "get better." PT Goal Formulation: With patient Time For Goal Achievement: 08/27/19 Potential to Achieve Goals: Good    Frequency Min 3X/week   Barriers to discharge        Co-evaluation               AM-PAC PT "6 Clicks" Mobility  Outcome Measure Help needed turning from your back to your side while in a flat bed without using bedrails?: None Help needed moving from lying on your back to sitting on the side of a flat bed without using bedrails?: A Little Help needed moving to and from a bed to a chair (including a wheelchair)?: A Little Help needed standing up from a chair using your arms (e.g., wheelchair or bedside chair)?: A Lot Help needed to walk in hospital room?: A Little Help needed climbing 3-5 steps with a railing? : Total 6 Click Score: 16    End of Session   Activity Tolerance: Patient tolerated treatment well Patient left: in chair;with chair alarm set;with call bell/phone within reach;with family/visitor present Nurse Communication: Mobility status PT Visit Diagnosis: Muscle weakness (generalized) (M62.81);Difficulty in walking, not elsewhere classified (R26.2)    Time: 4888-9169 PT Time Calculation (min) (ACUTE ONLY): 28 min   Charges:   PT Evaluation $PT Eval Moderate Complexity: 1 Mod PT Treatments $Therapeutic Exercise: 8-22 mins         Ellamae Sia, PT, DPT Acute Rehabilitation Services Pager [phone] Office [phone]   Willy Eddy 08/13/2019, 4:35 PM

## 2019-08-13 NOTE — Progress Notes (Signed)
NAME:  [name], MRN:  [id number], DOB:  06/29/1941, LOS: 7 ADMISSION DATE:  08/06/2019, CONSULTATION DATE:  10/26 REFERRING MD: Hilty  , CHIEF COMPLAINT:  Acute respiratory failure   Brief History   78 year old male patient with prior stage IV CKD and severe underlying coronary artery disease.  Initially admitted to [facility] with non-ST elevation MI.  Underwent cardiac catheterization on 10/23 had small localized perforation of SVG during stent placement, transferred to [facility] because of this.  No further cardiac interventions completed however developed progressive renal failure and associated respiratory failure ultimately requiring critical care and nephrology consult on 10/26.  History of present illness    This is a pleasant 78 year old white male with a significant history of coronary artery disease as mentioned below.  He was admitted to [facility] initially with chief complaint of chest pain, elevated troponin and working diagnosis of non-ST elevation MI.  He went for cardiac catheterization at [facility], catheterization was notable for chronic occlusion of the SVG to OM with severe distal disease of the RCA which was felt to be the culprit lesion.  Intervention was complicated by focal perforation of the SVG during stent placement, contrast extravasation was controlled with balloon tamponade, however contained extravasation with still evident and because of this he was transferred to [facility].  As noted in history has a history of stage IV chronic kidney disease, on 10/24 he was started on bicarbonate drip as well as nitro infusion for increased hypertension.  Urine output was minimal.  Limited echocardiogram was obtained on 10/24 this was negative for pericardial effusion.  He did have recurrent chest pain the a.m. of 10/25 treated with nitroglycerin.  On 10/26 his renal function had continued to worsen.  Creatinine now up to 8.  Volume overloaded with worsening tachypnea,  cough, oxygen requirements.  Chest x-ray showing pulmonary edema.  Critical care asked to evaluate for ICU transfer as well as possible dialysis catheter placement and potential need for escalated pulmonary support  Past Medical History  Coronary artery disease status post CABG x3 vessels in 1997.  Carotid artery disease status post CEA x2, diabetes type 2, hypertension, hyperlipidemia, CKD stage IV, prior CVA.  Most recently non-ST elevation MI with PCI to the venous graft to RCA complicated by perforation of graft.  Significant Hospital Events   10/23 admitted from [facility] for possible need for intervention concerning SVG perforation.  Serum creatinine 5.38 at that time  10/23 through 10/26.  Progressive respiratory failure Portable chest x-ray with cardiomegaly, chronic elevated right hemidiaphragm, and vascular congestion creatinine up to 8.14, potassium 5.3, sodium down to 129 complaining of worsening shortness of breath pulmonary asked to evaluate Transfer to the ICU, hemodialysis catheter placed and CRRT initiated  10/27-escalating norepi requirements.  Unable to remove volume with CRRT  Consults:  Pulmonary 10/26 Nephrology 10/26  Procedures:  Lt radial a line 10/27 >>  Significant Diagnostic Tests:  10/24 Limited echocardiogram: EF 60 to 65% with normal left ventricular function normal right ventricular function no pericardial effusion  Micro Data:    Antimicrobials:    Interim history/subjective:  Weaned off of NE Eating breakfast this morning, no complaints   Objective   Blood pressure (!) 125/54, pulse (!) 49, temperature 97.7 F (36.5 C), temperature source Oral, resp. rate (!) 21, height 6\' 2"  (1.88 m), weight 91.3 kg, SpO2 100 %.        Intake/Output Summary (Last 24 hours) at 08/13/2019 0740 Last data filed at  08/13/2019 0700 Gross per 24 hour  Intake 954.06 ml  Output 600 ml  Net 354.06 ml   Filed Weights   08/11/19 0600 08/12/19 0500 08/13/19  5621  Weight: 96.6 kg 92.2 kg 91.3 kg    Examination: Gen:      Older adult M, reclined in bed eating breakfast HEENT: [place]. Scant amount of dried blood inside nares. Pink mmm. Trachea midline. Anicteric sclere Lungs:    Right sided scattered rhonchi. No wheeze. Symmetrical chest expansion. No accessory muscle use   CV:        Bradycardic rate. s1s2 no rgm. Cap refill < 3 seconds.  Abd:     Soft, round, ndnt. Normoactive  Ext:    Symmetrical bulk and tone. 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 States this is baseline. P -SLP eval  Deconditioning  P -PT eval  NSTEMI s/p PCI/drug eluting stent to SVG graft of RCA -c/b focal perforation of graft Acute Hypotension Acute diastolic heart failure Hx HTN Atrial Fibrillation -CHADS-VASC 6 -followed by cardiology Plan Cardiology primary, appreciate cardiology recs  Continue ASA, plavix On heparin gtt  Remains off home antihypertensives at present time,  Continue tele monitoring   Acute on CKD IV Suspect secondary to contrast superimposed on underlying CKD  Plan Off CRRT Nephrology following Renal panels BID per nephrology   DM II  Plan SSI + lantus   Anemia  -likely component of chronic anemia  Plan Trend CBC PRN   Best practice:  Diet: PO: heart/carb  Pain/Anxiety/Delirium protocol (if indicated): NA VAP protocol (if indicated): NA DVT prophylaxis: heparin gtt  GI prophylaxis: Protonix  Glucose control: SSI +  basal qHS  Mobility: BR Code Status: Full code (does however make clear that he does not want "prolonged support") Family Communication: Patient updated at bedside  Disposition: In ICU at present time. Patient is off pressors and is HDS as well as stable from respiratory status. Will discuss with Primary team -- anticipate PCCM can likely sign off at this time   Labs   CBC: Recent Labs  Lab 08/09/19 0853 08/10/19 1031 08/11/19 0500 08/12/19 0948 08/12/19 1341 08/12/19 1505  WBC 8.7 20.7*   21.1* 15.9* 10.0  --  11.4*  NEUTROABS  --  19.3*  --   --   --   --   HGB 11.3* 11.9*   11.9* 11.5* 12.1* 10.9* 11.2*  HCT 34.5* 36.5*   35.8* 35.2* 35.8* 32.0* 34.4*  MCV 98.0 100.3*   98.6 99.2 97.0  --  99.4  PLT 117* 179   174 210 188  --  308    Basic Metabolic Panel: Recent Labs  Lab 08/10/19 0247 08/10/19 1553 08/11/19 0500 08/11/19 1600 08/12/19 0410 08/12/19 1341 08/12/19 1505  NA 133* 134* 134* 133* 132* 134* 132*  K 4.5 4.4 3.6 3.5 3.1* 3.8 3.9  CL 101 102 101 100 97*  --  101  CO2 18* 17* 16* 22 21*  --  21*  GLUCOSE 182* 188* 228* 209* 199*  --  269*  BUN 60* 48* 39* 33* 24*  --  34*  CREATININE 6.10* 4.54* 3.58* 3.06* 2.86*  --  3.61*  CALCIUM 7.9* 7.9* 8.0* 7.9* 8.3*  --  8.6*  MG 2.0  --  2.6*  --  2.7*  --   --   PHOS 5.0* 5.9* 5.0* 3.8 3.1  --  3.7   GFR: Estimated Creatinine Clearance: 19.3 mL/min (A) (by C-G formula based on SCr of 3.61 mg/dL (H)). Recent Labs  Lab 08/10/19 1031 08/11/19 0500 08/12/19 0948 08/12/19 1505  WBC 20.7*   21.1* 15.9* 10.0 11.4*    Liver Function Tests: Recent Labs  Lab 08/10/19 1553 08/11/19 0500 08/11/19 1600 08/12/19 0410 08/12/19 1505  ALBUMIN 2.1* 2.1* 2.2* 2.4* 2.3*   No results for input(s): LIPASE, AMYLASE in the last 168 hours. No results for input(s): AMMONIA in the last 168 hours.  ABG    Component Value Date/Time   PHART 7.434 08/12/2019 1341   PCO2ART 30.8 (L) 08/12/2019 1341   PO2ART 72.0 (L)  08/12/2019 1341   HCO3 20.7 08/12/2019 1341   TCO2 22 08/12/2019 1341   ACIDBASEDEF 3.0 (H) 08/12/2019 1341   O2SAT 95.0 08/12/2019 1341     Coagulation Profile: Recent Labs  Lab 08/12/19 0948  INR 1.2    Cardiac Enzymes: No results for input(s): CKTOTAL, CKMB, CKMBINDEX, TROPONINI in the last 168 hours.  HbA1C: Hemoglobin A1C  Date/Time Value Ref Range Status  11/08/2011 02:52 AM 10.2 (H) 4.2 - 6.3 % Final    Comment:    The American Diabetes Association recommends that a primary goal of therapy should be <7% and that physicians should reevaluate the treatment regimen in patients with HbA1c values consistently >8%.    Hgb A1c MFr Bld  Date/Time Value Ref Range Status  08/05/2019 04:39 AM 8.5 (H) 4.8 - 5.6 % Final    Comment:    (NOTE) Pre diabetes:          5.7%-6.4% Diabetes:              >6.4% Glycemic control for   <7.0% adults with diabetes   06/16/2018 9.8 (A) 4.0 - 6.0 Final    CBG: Recent Labs  Lab 08/12/19 0653 08/12/19 1200 08/12/19 1619 08/12/19 2124 08/13/19 [place] MSN, AGACNP-BC [place] [phone] If no answer, [phone] 08/13/2019, 7:40 AM

## 2019-08-13 NOTE — Progress Notes (Addendum)
Modified Barium Swallow Progress Note  Patient Details  Name: [name] MRN: [id number] Date of Birth: 1941-02-20  Today's Date: 08/13/2019  Modified Barium Swallow completed.  Full report located under Chart Review in the Imaging Section.  Brief recommendations include the following:  Clinical Impression  Pt presents with silent aspiration with liquids regardless of consistency and bolus size that appears to be related to incomplet laryngeal vestibule closure. 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 SLP attempted various bolus delivery methods, bolus sizes, and positional strategies to try to increase airway protection. He had the most success when he used a head turn to the right, but he continue to have small amounts enter the airway unless he combined this with a spoonful of honey thick liquids. Pt also verbalized that he did not want to have solid foods, as subjectively this is what seemed hard at breakfast. Will start a Dys 1 (pureed) diet and honey thick liquids by spoon with a Right head turn. Pt will need to use a spoon-sized bolus and a right head turn or else he will still silently aspirate honey thick liquids. Sign was made for reinforcement and instructions were reviewed with RN. SLP will contineu to follow acutely.    Swallow Evaluation Recommendations       SLP Diet Recommendations: Dysphagia 1 (Puree) solids;Honey thick liquids   Liquid Administration via: Spoon   Medication Administration: Crushed with puree   Supervision: Patient able to self feed;Full supervision/cueing for compensatory strategies   Compensations: Slow rate;Small sips/bites;Other (Comment)(HEAD TURN TO THE RIGHT)   Postural Changes: Seated upright at 90 degrees   Oral Care Recommendations: Oral care BID   Other Recommendations: Order thickener from pharmacy;Prohibited food (jello, ice cream, thin  soups);Remove water pitcher    Venita Sheffield Adaysha Dubinsky 08/13/2019,2:15 PM   Pollyann Glen, M.A. [place] Acute Environmental education officer [phone] Office [phone]

## 2019-08-13 NOTE — Progress Notes (Signed)
RN called regarding urinary retention.  Patient bladder scan showed 565 cc and patient has not voided all day.  Foley catheter ordered.  Rosaria Ferries, PA-C 08/13/2019 5:57 PM Beeper [phone]

## 2019-08-13 NOTE — Plan of Care (Signed)
  Problem: Activity: Goal: Risk for activity intolerance will decrease Outcome: Not Progressing   Problem: Elimination: Goal: Will not experience complications related to bowel motility Outcome: Adequate for Discharge Goal: Will not experience complications related to urinary retention Outcome: Progressing   Problem: Elimination: Goal: Will not experience complications related to urinary retention Outcome: Progressing   Problem: Skin Integrity: Goal: Risk for impaired skin integrity will decrease Outcome: Progressing

## 2019-08-13 NOTE — Progress Notes (Signed)
Rehab Admissions Coordinator Note:  Patient was screened by Michel Santee for appropriateness for an Inpatient Acute Rehab Consult.  At this time, we are recommending Inpatient Rehab consult.  Michel Santee 08/13/2019, 5:07 PM  I can be reached at [phone].

## 2019-08-13 NOTE — Progress Notes (Addendum)
Pt. Right IJ HD catheter positional. Pt coughing interrupts HD tx. BFR decreased due to increased AP

## 2019-08-13 NOTE — Progress Notes (Signed)
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  Non-ST segment elevated MI I cardiac catheterization stent complicated by perforation.  Transferred to [facility] 08/09/2019.  Acute on chronic kidney injury Baseline serum creatinine 3.22 August 2018.  Patient initiated on CRRT 08/09/2019 -08/12/2019.  We will discontinue CRRT at this time and consider transition to intermittent hemodialysis 08/13/2019  Blood pressure 125/51 pulse 69 temperature 97.7 O2 sats 94% 2 L  Sodium 134 potassium 3.5 chloride 100 CO2 22 BUN 45 creatinine 5.26 glucose 119 calcium 8.4 phosphorus 3.9 albumin 2.1 WBC 11.2 hemoglobin 10.1 platelets 120  Aspirin 81 mg daily atorvastatin 80 mg daily, levothyroxine 300 mcg daily Protonix 40 mg twice daily Brilinta 90 mg twice daily    Ultrafilter 3 L 08/11/2019    Objective:  Vital signs in last 24 hours:  Temp:  [97.5 F (36.4 C)-98.3 F (36.8 C)] 97.7 F (36.5 C) (10/30 0315) Pulse Rate:  [25-62] 49 (10/30 0700) Resp:  [15-28] 21 (10/30 0800) BP: (80-176)/(42-83) 130/65 (10/30 0800) SpO2:  [93 %-100 %] 96 % (10/30 0800) Arterial Line BP: (81-240)/(31-63) 81/36 (10/30 0240) Weight:  [91.3 kg] 91.3 kg (10/30 0702)  Weight change:  Filed Weights   08/11/19 0600 08/12/19 0500 08/13/19 0702  Weight: 96.6 kg 92.2 kg 91.3 kg    Intake/Output: I/O last 3 completed shifts: In: 1023.9 [P.O.:480; I.V.:353.9; IV Piggyback:190] Out: 5409 [Urine:600; Other:2742]   Intake/Output this shift:  Total I/O In: 130.9 [P.O.:120; I.V.:10.9] Out: -    General: Awake, in moderate distress  HEENT: MMM [place] AT anicteric sclera Neck: Positive JVD, no adenopathy CV:  Heart RRR  Lungs: Audible rails bilaterally Abd:  abd SNT/ND with normal BS GU:  Bladder non-palpable Extremities: +2 bilateral LE  edema. Skin:  No skin rash Psych:  normal mood and affect Neuro:  no focal deficits   Basic Metabolic Panel: Recent Labs  Lab 08/10/19 0247  08/11/19 0500 08/11/19 1600 08/12/19 0410 08/12/19 1341 08/12/19 1505 08/13/19 0604  NA 133*   < > 134* 133* 132* 134* 132* 134*  K 4.5   < > 3.6 3.5 3.1* 3.8 3.9 3.5  CL 101   < > 101 100 97*  --  101 100  CO2 18*   < > 16* 22 21*  --  21* 22  GLUCOSE 182*   < > 228* 209* 199*  --  269* 119*  BUN 60*   < > 39* 33* 24*  --  34* 45*  CREATININE 6.10*   < > 3.58* 3.06* 2.86*  --  3.61* 5.26*  CALCIUM 7.9*   < > 8.0* 7.9* 8.3*  --  8.6* 8.4*  MG 2.0  --  2.6*  --  2.7*  --   --   --   PHOS 5.0*   < > 5.0* 3.8 3.1  --  3.7 3.9   < > = values in this interval not displayed.    Liver Function Tests: Recent Labs  Lab 08/11/19 0500 08/11/19 1600 08/12/19 0410 08/12/19 1505 08/13/19 0604  ALBUMIN 2.1* 2.2* 2.4* 2.3* 2.1*   No results for input(s): LIPASE, AMYLASE in the last 168 hours. No results for input(s): AMMONIA in the last 168 hours.  CBC: Recent Labs  Lab 08/10/19 1031 08/11/19 0500 08/12/19 0948 08/12/19 1341 08/12/19 1505 08/13/19 0604  WBC 20.7*  21.1*  15.9* 10.0  --  11.4* 11.2*  NEUTROABS 19.3*  --   --   --   --   --   HGB 11.9*  11.9* 11.5* 12.1* 10.9* 11.2* 10.1*  HCT 36.5*  35.8* 35.2* 35.8* 32.0* 34.4* 30.7*  MCV 100.3*  98.6 99.2 97.0  --  99.4 100.0  PLT 179  174 210 188  --  182 120*    Cardiac Enzymes: No results for input(s): CKTOTAL, CKMB, CKMBINDEX, TROPONINI in the last 168 hours.  BNP: Invalid input(s): POCBNP  CBG: Recent Labs  Lab 08/12/19 0653 08/12/19 1200 08/12/19 1619 08/12/19 2124 08/13/19 0653  GLUCAP 188* 284* 242* 80 123*    Microbiology: Results for orders placed or performed during the hospital encounter of 08/04/19  MRSA PCR Screening     Status: None   Collection Time: 08/04/19 10:47 PM   Specimen: Nasal Mucosa; Nasopharyngeal  Result Value Ref Range Status    MRSA by PCR NEGATIVE NEGATIVE Final    Comment:        The GeneXpert MRSA Assay (FDA approved for NASAL specimens only), is one component of a comprehensive MRSA colonization surveillance program. It is not intended to diagnose MRSA infection nor to guide or monitor treatment for MRSA infections. Performed at [facility], [address] [place], [place] [address]   SARS Coronavirus 2 by RT PCR (hospital order, performed in [facility] hospital lab) Nasopharyngeal Nasopharyngeal Swab     Status: None   Collection Time: 08/05/19 12:19 AM   Specimen: Nasopharyngeal Swab  Result Value Ref Range Status   SARS Coronavirus 2 NEGATIVE NEGATIVE Final    Comment: (NOTE) If result is NEGATIVE SARS-CoV-2 target nucleic acids are NOT DETECTED. The SARS-CoV-2 RNA is generally detectable in upper and lower  respiratory specimens during the acute phase of infection. The lowest  concentration of SARS-CoV-2 viral copies this assay can detect is 250  copies / mL. A negative result does not preclude SARS-CoV-2 infection  and should not be used as the sole basis for treatment or other  patient management decisions.  A negative result may occur with  improper specimen collection / handling, submission of specimen other  than nasopharyngeal swab, presence of viral mutation(s) within the  areas targeted by this assay, and inadequate number of viral copies  (<250 copies / mL). A negative result must be combined with clinical  observations, patient history, and epidemiological information. If result is POSITIVE SARS-CoV-2 target nucleic acids are DETECTED. The SARS-CoV-2 RNA is generally detectable in upper and lower  respiratory specimens dur ing the acute phase of infection.  Positive  results are indicative of active infection with SARS-CoV-2.  Clinical  correlation with patient history and other diagnostic information is  necessary to determine patient infection status.  Positive results  do  not rule out bacterial infection or co-infection with other viruses. If result is PRESUMPTIVE POSTIVE SARS-CoV-2 nucleic acids MAY BE PRESENT.   A presumptive positive result was obtained on the submitted specimen  and confirmed on repeat testing.  While 2019 novel coronavirus  (SARS-CoV-2) nucleic acids may be present in the submitted sample  additional confirmatory testing may be necessary for epidemiological  and / or clinical management purposes  to differentiate between  SARS-CoV-2 and other Sarbecovirus currently known to infect humans.  If clinically indicated additional testing with an alternate test  methodology [phone]) is advised. The SARS-CoV-2 RNA is generally  detectable in upper and lower respiratory sp ecimens during the  acute  phase of infection. The expected result is Negative. Fact Sheet for Patients:  StrictlyIdeas.no Fact Sheet for Healthcare Providers: BankingDealers.co.za This test is not yet approved or cleared by the [place] FDA and has been authorized for detection and/or diagnosis of SARS-CoV-2 by FDA under an Emergency Use Authorization (EUA).  This EUA will remain in effect (meaning this test can be used) for the duration of the COVID-19 declaration under Section 564(b)(1) of the Act, 21 U.S.C. section 360bbb-3(b)(1), unless the authorization is terminated or revoked sooner. Performed at [facility], [address] [place], [place] [address]     Coagulation Studies: Recent Labs    08/12/19 0948 08/13/19 0604  LABPROT 15.5* 16.1*  INR 1.2 1.3*    Urinalysis: No results for input(s): COLORURINE, LABSPEC, PHURINE, GLUCOSEU, HGBUR, BILIRUBINUR, KETONESUR, PROTEINUR, UROBILINOGEN, NITRITE, LEUKOCYTESUR in the last 72 hours.  Invalid input(s): APPERANCEUR    Imaging: Dg Cervical Spine 2 Or 3 Views  Result Date: 08/12/2019 CLINICAL DATA:  Neck pain without known injury. EXAM:  CERVICAL SPINE - 2-3 VIEW COMPARISON:  None. FINDINGS: Minimal grade 1 anterolisthesis of C5-6 is noted secondary to posterior facet joint hypertrophy. No fracture is noted. Disc spaces are well-maintained. No prevertebral soft tissue swelling is noted. IMPRESSION: Minimal grade 1 anterolisthesis of C5-6 secondary to posterior facet joint hypertrophy. No other abnormality seen in the cervical spine. Electronically Signed   By: Marijo Conception M.D.   On: 08/12/2019 10:30   Dg Chest Port 1 View  Result Date: 08/12/2019 CLINICAL DATA:  Short of breath EXAM: PORTABLE CHEST 1 VIEW COMPARISON:  08/10/2019 FINDINGS: Right jugular central venous catheter tip proximal SVC unchanged. Prior CABG. Negative for heart failure. Elevated right hemidiaphragm with right lower lobe atelectasis improved from the prior study. No edema or effusion. Improved aeration in the left lung base. IMPRESSION: Improved aeration in the lung bases. Electronically Signed   By: Franchot Gallo M.D.   On: 08/12/2019 13:58     Medications:   . sodium chloride    . heparin 1,100 Units/hr (08/13/19 0800)   . aspirin EC  81 mg Oral Daily  . atorvastatin  80 mg Oral q1800  . Chlorhexidine Gluconate Cloth  6 each Topical Daily  . clopidogrel  75 mg Oral Daily  . insulin aspart  0-15 Units Subcutaneous TID WC  . insulin aspart  0-5 Units Subcutaneous QHS  . insulin glargine  10 Units Subcutaneous BID  . levothyroxine  300 mcg Oral Q0600  . pantoprazole  40 mg Oral Daily  . sodium chloride flush  10-40 mL Intracatheter Q12H   Place/Maintain arterial line **AND** sodium chloride, acetaminophen, albuterol, guaiFENesin, heparin, heparin, loperamide, nitroGLYCERIN, ondansetron (ZOFRAN) IV, sodium chloride, sodium chloride flush  Assessment/ Plan:   Chronic kidney disease stage IV followed at [facility] last renal ultrasound revealed medical renal disease both kidneys no evidence of renal mass or hydronephrosis.  Left sided bladder diverticulum  11/05/2018.  Repeat ultrasound ordered 08/10/2019.  Urine microscopy revealed 21-50 WBCs per high-powered film 6-10 RBCs per high-powered film.  Greater than 300 mg proteinuria.  I would consider Foley trauma as a cause of his microscopic hematuria.  I doubt this represents acute interstitial nephritis or acute glomerulonephritis at this point.  Acute kidney injury most likely related related to hemodynamics contrast-induced nephropathy with marked worsening of renal function.  Possible thromboembolic disease.  Urgent dialysis required dialysis catheter placed 08/09/2019 with initiation of CRRT -continues to be oliguric  .  We will stop CRRT 08/12/2019 and transition to hemodialysis.  He has improved hemodynamics plan dialysis 08/13/2019  Acute congestive heart failure with most recent echocardiogram revealing normal ejection fraction followed by cardiology.  Will transition patient to intermittent hemodialysis  Atrial fibrillation now placed on heparin drip not on antihypertensives secondary to hypotension and bradycardia  NSTEMI status post PCI DES to SVG-RCA 15/40/0867 complicated by focal perforation of graft.  Appreciate assistance from cardiology  Hypotension   A-line seems to be monitoring blood pressures considerably higher than cuff.  Weaned off I Levophed  Lipidemia continue statin  Diabetes mellitus per primary team.   LOS: Bee @TODAY @10 :08 AM

## 2019-08-13 NOTE — Progress Notes (Signed)
Received pt from dialysis. VSS. CBG 94, pt w/ no complaints. Small spot on LFA bleeding quite a bit- pad under arm and dressing were soaked on arrival. Cleaned up arm and placed several 4x4s and kerlix. Changed IV dressing as well, IV itself not bleeding. Will reassess pt's arm around 2300 (heparin paused at 2100 per HD RN) Pt was oriented to room and call light. Will continue to monitor.

## 2019-08-13 NOTE — Significant Event (Signed)
Patient transferred to new room at [place], right after MBS in the x-ray department. Patient traveled via bed. VS stable during the transfer and the procedure. Report given to receiving RN Dianah Field. Patient's belongings taken to new room by NT Oumar.    [name]

## 2019-08-13 NOTE — Progress Notes (Addendum)
ANTICOAGULATION CONSULT NOTE - Initial Consult  Pharmacy Consult for Heparin and warfarin Indication: atrial fibrillation  Allergies  Allergen Reactions   Ace Inhibitors Other (See Comments)    Reaction:  Raises potassium    Quinapril Rash and Other (See Comments)    hyperkalemia    Patient Measurements: Height: 6\' 2"  (188 cm) Weight: 201 lb 4.5 oz (91.3 kg) IBW/kg (Calculated) : 82.2 Heparin Dosing Weight: 90.2 kg  Vital Signs: Temp: 97.7 F (36.5 C) (10/30 0315) Temp Source: Oral (10/30 0315) BP: 130/65 (10/30 0800) Pulse Rate: 49 (10/30 0700)  Labs: Recent Labs    08/11/19 0500  08/12/19 0410 08/12/19 0948 08/12/19 1341 08/12/19 1505 08/12/19 1616 08/12/19 1959 08/13/19 0604  HGB 11.5*  --   --  12.1* 10.9* 11.2*  --   --  10.1*  HCT 35.2*  --   --  35.8* 32.0* 34.4*  --   --  30.7*  PLT 210  --   --  188  --  182  --   --  120*  APTT >200*  --  >200*  --   --   --   --   --  >200*  LABPROT  --   --   --  15.5*  --   --   --   --  16.1*  INR  --   --   --  1.2  --   --   --   --  1.3*  HEPARINUNFRC  --   --   --   --   --   --  1.08* 1.04* 1.00*  CREATININE 3.58*   < > 2.86*  --   --  3.61*  --   --  5.26*   < > = values in this interval not displayed.    Estimated Creatinine Clearance: 13.2 mL/min (A) (by C-G formula based on SCr of 5.26 mg/dL (H)).   Medical History: Past Medical History:  Diagnosis Date   Blood transfusion without reported diagnosis    patient unaware of receiving blood unless it was during surgery and he was not told   Cancer [facility]) 2001   Colon resection   CAP (community acquired pneumonia) 08/11/2017   Carotid stenosis, symptomatic, with infarction ([place]) 07/17/2017   Chronic kidney disease 11/2017   stage IV CKD per nephrologist.   Complication of anesthesia    raspy voice since carotid endarterectomy 07/17/17. paralysis of vocal chords   Coronary artery disease    Diabetes mellitus without complication [facility])    Dysrhythmia    patient unaware of any irregular heart rhythms   Hyperlipidemia    Hypertension    Hypothyroidism    Kidney mass 2019   Myocardial infarction [facility]) 1997   Peripheral vascular disease ([place])    Pneumonia 2014, 2018   developed after surgery 2018   Stroke ([place]) 07/2017   mild stroke and then had carotid endarterectomy   Thyroid disease    Wears dentures    full upper   Weight loss 2019   patient has lost over 60 pounds since 07/2017.     Medications:  Scheduled:   aspirin EC  81 mg Oral Daily   atorvastatin  80 mg Oral q1800   Chlorhexidine Gluconate Cloth  6 each Topical Daily   clopidogrel  75 mg Oral Daily   insulin aspart  0-15 Units Subcutaneous TID WC   insulin aspart  0-5 Units Subcutaneous QHS   insulin glargine  10 Units Subcutaneous  BID   levothyroxine  300 mcg Oral Q0600   pantoprazole  40 mg Oral Daily   sodium chloride flush  10-40 mL Intracatheter Q12H    Assessment: 45 YOM presented with NSTEMI, s/p PCI with DES to SVG-RCA on 82/70, complicated by focal perforation of the graft. Pt on DAPT w/ ASA and Brilinta, now in Afib and requiring anticoagulation. No AC prior to admission, pt has been receiving heparin through CRRT since 10/26 which is off as of 10/29.  - Heparin level remains supratherapeutic at 1.00 this AM despite rate decrease - APTT >200 - INR 1.3 - Pt having nosebleeds - Hgb 10.1, Hct 30.7, plt 120   Goal of Therapy:  INR 2-3 Heparin level 0.3-0.7 units/ml Monitor platelets by anticoagulation protocol: Yes   Plan:  - Hold heparin x1hr - Restart heparin at 850 units/hr - Recheck heparin level in 8hr   Vallery Sa, PharmD Candidate 08/13/2019

## 2019-08-13 NOTE — Significant Event (Signed)
Patient has been coughing up pieces of sausage from his breakfast this morning and continued bleeding from his nose. Patient states coughing occurs normally with food intake although not as long as this.   Breakfast tray removed. Rounding teams made aware.SLP order placed.     [name]

## 2019-08-13 NOTE — Evaluation (Signed)
Clinical/Bedside Swallow Evaluation Patient Details  Name: [name] MRN: [id number] Date of Birth: Jul 22, 1941  Today's Date: 08/13/2019 Time: SLP Start Time (ACUTE ONLY): 4098 SLP Stop Time (ACUTE ONLY): 1006 SLP Time Calculation (min) (ACUTE ONLY): 15 min  Past Medical History:  Past Medical History:  Diagnosis Date  . Blood transfusion without reported diagnosis    patient unaware of receiving blood unless it was during surgery and he was not told  . Cancer [facility]) 2001   Colon resection  . CAP (community acquired pneumonia) 08/11/2017  . Carotid stenosis, symptomatic, with infarction (HCC) 07/17/2017  . Chronic kidney disease 11/2017   stage IV CKD per nephrologist.  . Complication of anesthesia    raspy voice since carotid endarterectomy 07/17/17. paralysis of vocal chords  . Coronary artery disease   . Diabetes mellitus without complication (HCC)   . Dysrhythmia    patient unaware of any irregular heart rhythms  . Hyperlipidemia   . Hypertension   . Hypothyroidism   . Kidney mass 2019  . Myocardial infarction (HCC) 1997  . Peripheral vascular disease (HCC)   . Pneumonia 2014, 2018   developed after surgery 2018  . Stroke (HCC) 07/2017   mild stroke and then had carotid endarterectomy  . Thyroid disease   . Wears dentures    full upper  . Weight loss 2019   patient has lost over 60 pounds since 07/2017.    Past Surgical History:  Past Surgical History:  Procedure Laterality Date  . APPENDECTOMY  2011  . BACK SURGERY    . CARDIAC CATHETERIZATION  1997  . CAROTID ANGIOGRAPHY Right 06/13/2017   Procedure: Right subclavian and Carotid Angiography, possible intervention;  Surgeon: Annice Needy, MD;  Location: [facility] INVASIVE CV LAB;  Service: Cardiovascular;  Laterality: Right;  . CATARACT EXTRACTION, BILATERAL    . CHOLECYSTECTOMY N/A 04/23/2016   had infection post surgery requiring him to debride daily  . 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 COLONOSCOPY WITH PROPOFOL N/A 10/26/2018   Procedure: COLONOSCOPY WITH PROPOFOL;  Surgeon: Midge Minium, MD;  Location: [facility];  Service: Endoscopy;  Laterality: N/A;  Diabetic - insulin  . CORONARY ARTERY BYPASS GRAFT  1997   x 3  . CORONARY STENT INTERVENTION N/A 08/06/2019   Procedure: CORONARY STENT INTERVENTION;  Surgeon: Yvonne Kendall, MD;  Location: [facility] INVASIVE CV LAB;  Service: Cardiovascular;  Laterality: N/A;  . CYSTOSCOPY W/ RETROGRADES Bilateral 09/03/2017   Procedure: CYSTOSCOPY WITH RETROGRADE PYELOGRAM;  Surgeon: Vanna Scotland, MD;  Location: [facility] ORS;  Service: Urology;  Laterality: Bilateral;  . CYSTOSCOPY W/ RETROGRADES Right 01/28/2018   Procedure: CYSTOSCOPY WITH RETROGRADE PYELOGRAM;  Surgeon: Vanna Scotland, MD;  Location: [facility] ORS;  Service: Urology;  Laterality: Right;  . CYSTOSCOPY W/ URETERAL STENT REMOVAL  08/2017  . CYSTOSCOPY WITH BIOPSY Right 01/28/2018   Procedure: CYSTOSCOPY WITH BIOPSY;  Surgeon: Vanna Scotland, MD;  Location: [facility] ORS;  Service: Urology;  Laterality: Right;  . CYSTOSCOPY WITH STENT PLACEMENT Right 09/03/2017   Procedure: CYSTOSCOPY WITH STENT PLACEMENT;  Surgeon: Vanna Scotland, MD;  Location: [facility] ORS;  Service: Urology;  Laterality: Right;  . CYSTOSCOPY WITH STENT PLACEMENT Right 01/28/2018   Procedure: CYSTOSCOPY WITH STENT PLACEMENT;  Surgeon: Vanna Scotland, MD;  Location: [facility] ORS;  Service: Urology;  Laterality: Right;  . CYSTOSCOPY WITH URETEROSCOPY Right 01/28/2018   Procedure: CYSTOSCOPY WITH URETEROSCOPY;  Surgeon: Vanna Scotland, MD;  Location: [facility]  ORS;  Service: Urology;  Laterality: Right;  . ENDARTERECTOMY Right 07/17/2017   Procedure: ENDARTERECTOMY CAROTID;  Surgeon: Annice Needy, MD;  Location: [facility] ORS;  Service: Vascular;  Laterality: Right;  . HERNIA REPAIR  2011   Ventral hernia  . HOLMIUM LASER APPLICATION N/A 09/03/2017   Procedure: HOLMIUM LASER APPLICATION;  Surgeon:  Vanna Scotland, MD;  Location: [facility] ORS;  Service: Urology;  Laterality: N/A;  . KNEE SURGERY Left    arthroscopy  . KYPHOPLASTY N/A 08/14/2017   Procedure: [id number];  Surgeon: Kennedy Bucker, MD;  Location: [facility] ORS;  Service: Orthopedics;  Laterality: N/A;  . LARYNX SURGERY    . LEFT HEART CATH AND CORS/GRAFTS ANGIOGRAPHY N/A 08/06/2019   Procedure: LEFT HEART CATH AND CORS/GRAFTS ANGIOGRAPHY;  Surgeon: Lamar Blinks, MD;  Location: [facility] INVAS[facility] CV [facility];  Service: Cardiovascular;  Laterality: N/A;  . POLYPECTOMY  10/26/2018   Procedure: POLYPECTOMY INTESTINAL;  Surgeon: Midge Minium, MD;  Location: [facility];  Service: Endoscopy;;  Descending colon polyp Transverse colon polyps x 3  . PROSTATE SURGERY  2002   BPH benign pathology  . SPINE SURGERY  1989   Lumbar disc  . THYROPLASTY Right 01/05/2018   Procedure: THYROPLASTY;  Surgeon: Linus Salmons, MD;  Location: [facility] ORS;  Service: ENT;  Laterality: Right;   HPI:  Pt is a 78 year old male admitted to [facility] with NSTEMI s/p cardiac cath on 10/23, complicated by a small localized perforation of SVG during stent placement. He was subsequently transferred to [facility].  Pt developed progressive renal failure and associated respiratory failure. SLP was ordered as pt was noted to be coughing iwth meals. Pt had a R CEA in October 2018, after which he had hoarseness and trouble swallowing. Dusing admission later that month for PNA, MBS was completed that showed adequate oropharyngeal swallow with no aspiration, occasional trace penetration with thin liquids. ENT was recommended due to thickening around the epiglottis. Previous BSE in 2017 also [facility]. 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 PMH also includes: stage IV CKD, weight loss, thyroid disease, CVA, MI, CAD, HTN, HLD, DM, colon ca s/p resection   Assessment / Plan / Recommendation Clinical Impression  Pt has minimal s/s of aspiration  with POs observed today, although he did cough with thin liquids. 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 He reports that he has been swallowing well at home and has not had any other episodes of PNA, but that he felt like he was having a lot of difficulty with breakfast (especially the sausage). Given subjective report, hx, and potential for acute decline in function in the setting of overall deconditioning, recommend proceeding with MBS, scheduled for later today. Pt is in agreement. Would allow meds and ice chips pending completion.   SLP Visit Diagnosis: Dysphagia, unspecified (R13.10)    Aspiration Risk  Moderate aspiration risk    Diet Recommendation NPO except meds;Ice chips PRN after oral care   Medication Administration: Crushed with puree    Other  Recommendations Oral Care Recommendations: Oral care QID   Follow up Recommendations        Frequency and Duration            Prognosis Prognosis for Safe Diet Advancement: Good      Swallow Study   General HPI: Pt is a 78 year old male admitted to [facility]  with NSTEMI s/p cardiac cath on 10/23, complicated by a small localized perforation of SVG during stent placement. He was subsequently transferred to [facility].  Pt developed progressive renal failure and associated respiratory failure. SLP was ordered as pt was noted to be coughing iwth meals. Pt had a R CEA in October 2018, after which he had hoarseness and trouble swallowing. Dusing admission later that month for PNA, MBS was completed that showed adequate oropharyngeal swallow with no aspiration, occasional trace penetration with thin liquids. ENT was recommended due to thickening around the epiglottis. Previous BSE in 2017 also [facility]. 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 PMH also includes: stage IV CKD, weight loss, thyroid disease, CVA, MI,  CAD, HTN, HLD, DM, colon ca s/p resection Type of Study: Bedside Swallow Evaluation Previous Swallow Assessment: see HPI Diet Prior to this Study: Regular;Thin liquids Temperature Spikes Noted: No Respiratory Status: Room air History of Recent Intubation: No Behavior/Cognition: Alert;Cooperative Oral Cavity Assessment: Within Functional Limits Oral Care Completed by SLP: No Oral Cavity - Dentition: Dentures, not available;Poor condition;Missing dentition(few bottom teeth anteriorly) Self-Feeding Abilities: Needs assist Patient Positioning: Upright in bed Baseline Vocal Quality: Hoarse Volitional Swallow: Able to elicit    Oral/Motor/Sensory Function Overall Oral Motor/Sensory Function: Within functional limits   Ice Chips Ice chips: Within functional limits   Thin Liquid Thin Liquid: Impaired Presentation: Straw;Spoon Pharyngeal  Phase Impairments: Cough - Immediate;Cough - Delayed    Nectar Thick Nectar Thick Liquid: Not tested   Honey Thick Honey Thick Liquid: Not tested   Puree Puree: Within functional limits Presentation: Spoon   Solid     Solid: Not tested      Virl Axe Airiana Elman 08/13/2019,10:31 AM  Ivar Drape, M.A. CCC-SLP Acute Herbalist [phone] Office [phone]

## 2019-08-13 NOTE — Progress Notes (Signed)
Progress Note  Patient Name: [name] Date of Encounter: 08/13/2019  Primary Cardiologist: Dr. Nehemiah Massed  Subjective  O/N Events: Brilinta switched to Plavix. Levophed stopped  Mr.[name] was examined and evaluated at bedside this AM. He mentions resolution of his dyspnea. No further chest pain or palpitations. Discussed plan to transfer out of the ICU. Mr.[name] expressed understanding.  Inpatient Medications    Scheduled Meds: . aspirin EC  81 mg Oral Daily  . atorvastatin  80 mg Oral q1800  . Chlorhexidine Gluconate Cloth  6 each Topical Daily  . clopidogrel  75 mg Oral Daily  . insulin aspart  0-15 Units Subcutaneous TID WC  . insulin aspart  0-5 Units Subcutaneous QHS  . insulin glargine  10 Units Subcutaneous BID  . levothyroxine  300 mcg Oral Q0600  . pantoprazole  40 mg Oral Daily  . sodium chloride flush  10-40 mL Intracatheter Q12H   Continuous Infusions: . sodium chloride    . heparin 1,100 Units/hr (08/13/19 0131)  . norepinephrine (LEVOPHED) Adult infusion Stopped (08/12/19 1800)   PRN Meds: Place/Maintain arterial line **AND** sodium chloride, acetaminophen, albuterol, guaiFENesin, heparin, heparin, loperamide, nitroGLYCERIN, ondansetron (ZOFRAN) IV, sodium chloride flush   Vital Signs    Vitals:   08/12/19 2355 08/13/19 0240 08/13/19 0315 08/13/19 0702  BP:  (!) 102/49 (!) 95/55   Pulse: (!) 57 (!) 55 (!) 53   Resp: (!) 22 18 19  (!) 21  Temp: (!) 97.5 F (36.4 C)  97.7 F (36.5 C)   TempSrc: Oral  Oral   SpO2: 94% 97% 98%   Weight:    91.3 kg  Height:        Intake/Output Summary (Last 24 hours) at 08/13/2019 0709 Last data filed at 08/13/2019 0300 Gross per 24 hour  Intake 912.62 ml  Output 600 ml  Net 312.62 ml   Last 3 Weights 08/13/2019 08/12/2019 08/11/2019  Weight (lbs) 201 lb 4.5 oz 203 lb 4.2 oz 212 lb 15.4 oz  Weight (kg) 91.3 kg 92.2 kg 96.6 kg      Telemetry    Continued A.fib/flutter w/ ventricular bigeminy. HR ~70s -  Personally Reviewed  ECG    No new tracing to review - Personally Reviewed  Physical Exam   Gen: Well-developed, obese, NAD HEENT: NCAT head, hearing intact, EOMI, +Epistaxis Neck: supple, ROM intact CV: Irregularly irregular, S1, S2 normal, No rubs, no murmurs Pulm: CTAB, no wheezing, no rales Abd: Soft, BS+, NTND Extm: ROM intact, Peripheral pulses intact, no pitting edema bilateral LEs Skin: Dry, Warm, normal turgor  Neuro: AAOx3  Labs    High Sensitivity Troponin:   Recent Labs  Lab 08/04/19 2250 08/05/19 0043 08/05/19 0818 08/05/19 1624  TROPONINIHS 471* 507* 1,266* 1,123*      Chemistry Recent Labs  Lab 08/11/19 1600 08/12/19 0410 08/12/19 1341 08/12/19 1505  NA 133* 132* 134* 132*  K 3.5 3.1* 3.8 3.9  CL 100 97*  --  101  CO2 22 21*  --  21*  GLUCOSE 209* 199*  --  269*  BUN 33* 24*  --  34*  CREATININE 3.06* 2.86*  --  3.61*  CALCIUM 7.9* 8.3*  --  8.6*  ALBUMIN 2.2* 2.4*  --  2.3*  GFRNONAA 18* 20*  --  15*  GFRAA 21* 23*  --  17*  ANIONGAP 11 14  --  10     Hematology Recent Labs  Lab 08/11/19 0500 08/12/19 0948 08/12/19 1341 08/12/19 1505  WBC 15.9* 10.0  --  11.4*  RBC 3.55* 3.69*  --  3.46*  HGB 11.5* 12.1* 10.9* 11.2*  HCT 35.2* 35.8* 32.0* 34.4*  MCV 99.2 97.0  --  99.4  MCH 32.4 32.8  --  32.4  MCHC 32.7 33.8  --  32.6  RDW 16.3* 16.1*  --  16.0*  PLT 210 188  --  182    BNP Recent Labs  Lab 08/12/19 1505  BNP 353.8*     DDimer No results for input(s): DDIMER in the last 168 hours.   Radiology    Dg Cervical Spine 2 Or 3 Views  Result Date: 08/12/2019 CLINICAL DATA:  Neck pain without known injury. EXAM: CERVICAL SPINE - 2-3 VIEW COMPARISON:  None. FINDINGS: Minimal grade 1 anterolisthesis of C5-6 is noted secondary to posterior facet joint hypertrophy. No fracture is noted. Disc spaces are well-maintained. No prevertebral soft tissue swelling is noted. IMPRESSION: Minimal grade 1 anterolisthesis of C5-6 secondary to  posterior facet joint hypertrophy. No other abnormality seen in the cervical spine. Electronically Signed   By: Marijo Conception M.D.   On: 08/12/2019 10:30   Dg Chest Port 1 View  Result Date: 08/12/2019 CLINICAL DATA:  Short of breath EXAM: PORTABLE CHEST 1 VIEW COMPARISON:  08/10/2019 FINDINGS: Right jugular central venous catheter tip proximal SVC unchanged. Prior CABG. Negative for heart failure. Elevated right hemidiaphragm with right lower lobe atelectasis improved from the prior study. No edema or effusion. Improved aeration in the left lung base. IMPRESSION: Improved aeration in the lung bases. Electronically Signed   By: Franchot Gallo M.D.   On: 08/12/2019 13:58    Cardiac Studies   Cardiac Catheterization and Percutaneous Coronary Intervention 10.23.2020  Conclusions: 1. See diagnostic angiogram by Dr. Nehemiah Massed for details of coronary/graft anatomy. 2. Severe disease involving SVG-RCA with 80-90% stenosis. 3. Successful PCI to SVG-RCA using Resolute Onyx 4.0 x 38 mm drug-eluting stent with 10% residual stenosis and TIMI-3 flow. 4. Focal perforation of the SVG-RCA during stent deployment. This was successfully contained with internal tamponade.  Recommendations: 1. Dual antiplatelet therapy with aspirin and ticagrelor for at least 12 months. 2. Aggressive secondary prevention. 3. Transfer to [facility] for ICU monitoring, given focal perforation of SVG-RCA. If the patient were to become hemodynamically unstable or develop worsening anemia, repeat angiography of SVG-RCA and possible covered stent placement would need to be considered. _____________   2D Echocardiogram 10.24.2020  IMPRESSIONS  1. Left ventricular ejection fraction, by visual estimation, is 60 to 65%. The left ventricle has normal function. Normal left ventricular size. There is no left ventricular hypertrophy. 2. Global right ventricle has normal systolic function.The right ventricular size is  normal. No increase in right ventricular wall thickness. 3. Left atrial size was normal. 4. Right atrial size was norma 5. Severe mitral annular calcification. Mild calcification of the anterior mitral valve leaflet(s).. No evidence of mitral valve regurgitation. Mild mitral stenosis. 6. The tricuspid valve is normal in structure. Tricuspid valve regurgitation is mild. 7. The aortic valve is tricuspid Aortic valve regurgitation is mild by color flow Doppler. Moderate aortic valve sclerosis/calcification without any evidence of aortic stenosis. 8. The pulmonic valve was normal in structure. Pulmonic valve regurgitation is not visualized by color flow Doppler. 9. Normal pulmonary artery systolic pressure. 10. The inferior vena cava is normal in size with greater than 50% respiratory variability, suggesting right atrial pressure of 3 mmHg. 11. No evidence of pericardial effusion. _____________   Patient  Profile     78 y.o. male w/ a h/o CAD s/p CABG x 3 in 1997, carotid dzs s/p CEA x 2, DMII, HTN, HL, CKD V, and prior CVA, who was tx from [facility] following nstemi and PCI of the VG  RCA w/ perforation of the graft.  Assessment & Plan    A.Fib/Flutter w/ Slow ventricular rate Yesterday's telemetry shows In & out A.fib. CHADS-VASC of 6 due to age, htn, hx of mi, chf, dm. No obvious block on EKG but conduction delays resulting in HR ~70s. Currently receiving triple therapy due to need for anticoagulation + recent NSTEMI requiring DES. Now w/ nose bleed - Afrin Spray for epistaxis - Trend CBC - C/w heparin gtt - c/w transition to Coumadin (INR 1.3 this am) - Telemetry  NSTEMI Presented w/ chest pain w/ Elevated HsTrop 507->[phone] on admission. S/p PCI/DES to SVG-RCA on 58/85/02 complicated by focal perforation of the graft. Limited echo on 10/24 with normal EF and no effusion. Currently chest pain free. Off Levo overnight. Antiplatelet therapy switched to aspirin, plavix to reduce bleeding risk  - Transfer to telemetry - C/w nitrate PRN for chest pain - C/w asa, statin, plavix - Telemetry  Acute diastolic CHF Echo 77/41 showing EF 60-65%. Developed pulm edema on 08/06/19 improved w/ diuresis. Currently appears euvolemic. Currently weight of 92.2kg appears to be less than last known dry weight (99kg) - Hold AV nodal blocking agents - Daily weights, I&Os - Diuresed via CRRT  Essential hypertension Am bp 86/60 - Hold home bp meds due to hypotension   Hyperlipidemia LDL 82.  - C/w atorvastatin 80mg  daily  CKD stage V Followed by [facility] nephro. Post-cath, worsening creatinine 6.31->8.14 CRRT -> 6.1->3.6->5.26 after holding CRRT. 600cc urine overnight. Nephro planning on pausing CRRT to see if any recovery of native renal fx - Management per nephro  DM type II on insulin -A1c 8.5. Continue SSI while in hospital.   For questions or updates, please contact [place] Please consult [email or web address] for contact info under     Signed, Mosetta Anis, MD  08/13/2019, 7:09 AM   PGY-2, [place] Pager: [phone]  Attending attestation to follow

## 2019-08-14 LAB — RENAL FUNCTION PANEL
Albumin: 2.1 g/dL — ABNORMAL LOW (ref 3.5–5.0)
Albumin: 2 g/dL — ABNORMAL LOW (ref 3.5–5.0)
Anion gap: 12 (ref 5–15)
Anion gap: 13 (ref 5–15)
BUN: 38 mg/dL — ABNORMAL HIGH (ref 8–23)
BUN: 46 mg/dL — ABNORMAL HIGH (ref 8–23)
CO2: 21 mmol/L — ABNORMAL LOW (ref 22–32)
CO2: 18 mmol/L — ABNORMAL LOW (ref 22–32)
Calcium: 8.1 mg/dL — ABNORMAL LOW (ref 8.9–10.3)
Calcium: 7.9 mg/dL — ABNORMAL LOW (ref 8.9–10.3)
Chloride: 103 mmol/L (ref 98–111)
Chloride: 104 mmol/L (ref 98–111)
Creatinine, Ser: 4.7 mg/dL — ABNORMAL HIGH (ref 0.61–1.24)
Creatinine, Ser: 5.99 mg/dL — ABNORMAL HIGH (ref 0.61–1.24)
GFR calc Af Amer: 13 mL/min — ABNORMAL LOW (ref >60)
GFR calc Af Amer: 9 mL/min — ABNORMAL LOW (ref >60)
GFR calc non Af Amer: 11 mL/min — ABNORMAL LOW (ref >60)
GFR calc non Af Amer: 8 mL/min — ABNORMAL LOW (ref >60)
Glucose, Bld: 160 mg/dL — ABNORMAL HIGH (ref 70–99)
Glucose, Bld: 319 mg/dL — ABNORMAL HIGH (ref 70–99)
Phosphorus: 2.9 mg/dL (ref 2.5–4.6)
Phosphorus: 3.2 mg/dL (ref 2.5–4.6)
Potassium: 3.9 mmol/L (ref 3.5–5.1)
Potassium: 4.1 mmol/L (ref 3.5–5.1)
Sodium: 136 mmol/L (ref 135–145)
Sodium: 135 mmol/L (ref 135–145)

## 2019-08-14 LAB — GLUCOSE, CAPILLARY
Glucose-Capillary: 180 mg/dL — ABNORMAL HIGH (ref 70–99)
Glucose-Capillary: 186 mg/dL — ABNORMAL HIGH (ref 70–99)
Glucose-Capillary: 300 mg/dL — ABNORMAL HIGH (ref 70–99)
Glucose-Capillary: 329 mg/dL — ABNORMAL HIGH (ref 70–99)

## 2019-08-14 LAB — HEPARIN LEVEL (UNFRACTIONATED)
Heparin Unfractionated: 0.29 IU/mL — ABNORMAL LOW (ref 0.30–0.70)
Heparin Unfractionated: 0.18 IU/mL — ABNORMAL LOW (ref 0.30–0.70)

## 2019-08-14 LAB — CBC
HCT: 30.5 % — ABNORMAL LOW (ref 39.0–52.0)
Hemoglobin: 10.1 g/dL — ABNORMAL LOW (ref 13.0–17.0)
MCH: 32.4 pg (ref 26.0–34.0)
MCHC: 33.1 g/dL (ref 30.0–36.0)
MCV: 97.8 fL (ref 80.0–100.0)
Platelets: 109 10*3/uL — ABNORMAL LOW (ref 150–400)
RBC: 3.12 MIL/uL — ABNORMAL LOW (ref 4.22–5.81)
RDW: 15.7 % — ABNORMAL HIGH (ref 11.5–15.5)
WBC: 11.9 10*3/uL — ABNORMAL HIGH (ref 4.0–10.5)
nRBC: 0 % (ref 0.0–0.2)

## 2019-08-14 LAB — MAGNESIUM: Magnesium: 2.4 mg/dL (ref 1.7–2.4)

## 2019-08-14 LAB — CALCIUM, IONIZED: Calcium, Ionized, Serum: 4.5 mg/dL (ref 4.5–5.6)

## 2019-08-14 LAB — PROTIME-INR
INR: 1.2 (ref 0.8–1.2)
Prothrombin Time: 15 seconds (ref 11.4–15.2)

## 2019-08-14 MED ORDER — WARFARIN SODIUM 5 MG PO TABS
5.0000 mg | ORAL_TABLET | Freq: Once | ORAL | Status: AC
  Administered 2019-08-14: 5 mg via ORAL
  Filled 2019-08-14: qty 1

## 2019-08-14 NOTE — Progress Notes (Addendum)
Pt was reassessed at 2300; soaked through previous dressing in under an hour. Lowenstern MD w/ cardiology was paged and came to bedside to assess. Bleeding appeared to be from an old a-line site. Pressure was held ~ 10-15 minutes and a TR band was placed at 0000. Instructed to let air out per protocol, restart heparin when TR band is off. Will continue to monitor.  0230 TR band removed w/ no further bleed. Heparin restarted per pharmacy at 6.68ml/hr. Will continue to monitor for bleeding.

## 2019-08-14 NOTE — Progress Notes (Signed)
ANTICOAGULATION CONSULT NOTE - Initial Consult  Pharmacy Consult for Heparin and warfarin Indication: atrial fibrillation  Allergies  Allergen Reactions  . Ace Inhibitors Other (See Comments)    Reaction:  Raises potassium   . Quinapril Rash and Other (See Comments)    hyperkalemia    Patient Measurements: Height: 6\' 2"  (188 cm) Weight: 202 lb 6.1 oz (91.8 kg) IBW/kg (Calculated) : 82.2 Heparin Dosing Weight: 90.2 kg  Vital Signs: Temp: 97.9 F (36.6 C) (10/31 0846) Temp Source: Oral (10/31 0846) BP: 122/62 (10/31 1115) Pulse Rate: 43 (10/31 1115)  Labs: Recent Labs    08/12/19 0410  08/12/19 0948  08/12/19 1505  08/13/19 0604 08/13/19 1604 08/13/19 2229 08/14/19 0244 08/14/19 1010  HGB  --   --  12.1*   < > 11.2*  --  10.1*  --   --  10.1*  --   HCT  --   --  35.8*   < > 34.4*  --  30.7*  --   --  30.5*  --   PLT  --    < > 188  --  182  --  120*  --   --  109*  --   APTT >200*  --   --   --   --   --  >200*  --   --   --   --   LABPROT  --   --  15.5*  --   --   --  16.1*  --   --  15.0  --   INR  --   --  1.2  --   --   --  1.3*  --   --  1.2  --   HEPARINUNFRC  --   --   --   --   --    < > 1.00*  --  0.53  --  0.29*  CREATININE 2.86*  --   --   --  3.61*  --  5.26* 6.27*  --  4.70*  --    < > = values in this interval not displayed.    Estimated Creatinine Clearance: 14.8 mL/min (A) (by C-G formula based on SCr of 4.7 mg/dL (H)).   Medical History: Past Medical History:  Diagnosis Date  . Blood transfusion without reported diagnosis    patient unaware of receiving blood unless it was during surgery and he was not told  . Cancer [facility]) 2001   Colon resection  . CAP (community acquired pneumonia) 08/11/2017  . Carotid stenosis, symptomatic, with infarction ([place]) 07/17/2017  . Chronic kidney disease 11/2017   stage IV CKD per nephrologist.  . Complication of anesthesia    raspy voice since carotid endarterectomy 07/17/17. paralysis of vocal chords  .  Coronary artery disease   . Diabetes mellitus without complication ([place])   . Dysrhythmia    patient unaware of any irregular heart rhythms  . Hyperlipidemia   . Hypertension   . Hypothyroidism   . Kidney mass 2019  . Myocardial infarction ([place]) 1997  . Peripheral vascular disease ([place])   . Pneumonia 2014, 2018   developed after surgery 2018  . Stroke ([place]) 07/2017   mild stroke and then had carotid endarterectomy  . Thyroid disease   . Wears dentures    full upper  . Weight loss 2019   patient has lost over 60 pounds since 07/2017.     Medications:  Scheduled:  . aspirin EC  81 mg Oral  Daily  . atorvastatin  80 mg Oral q1800  . Chlorhexidine Gluconate Cloth  6 each Topical Daily  . Chlorhexidine Gluconate Cloth  6 each Topical Q0600  . clopidogrel  75 mg Oral Daily  . coumadin book   Does not apply Once  . insulin aspart  0-15 Units Subcutaneous TID WC  . insulin aspart  0-5 Units Subcutaneous QHS  . insulin glargine  10 Units Subcutaneous BID  . levothyroxine  300 mcg Oral Q0600  . pantoprazole  40 mg Oral Daily  . sodium chloride flush  10-40 mL Intracatheter Q12H  . Warfarin - Pharmacist Dosing Inpatient   Does not apply q1800    Assessment: 48 YOM presented with NSTEMI, s/p PCI with DES to SVG-RCA on 92/11, complicated by focal perforation of the graft. Pt on DAPT w/ ASA and Brilinta, now in Afib and requiring anticoagulation. No AC prior to admission, pt has been receiving heparin through CRRT since 10/26 which is off as of 10/29. Pt will be transitioned to IHD.  Heparin level is only slightly subtherapeutic at 0.29. Patient had recent episodes of nose bleed and blood from old arterial line, which has now resolved. Hemoglobin is now stable at 10.1. Plts are low and continue to decrease at 109. 4T score is 3, which indicates a low risk of HIT.   INR today is 1.2, down from 1.3 yesterday.   Goal of Therapy:  INR 2-3 Heparin level 0.3-0.7 units/ml Monitor platelets  by anticoagulation protocol: Yes   Plan:  - Continue heparin at 650 units/hr - Recheck heparin level in 8 hr - Warfarin 5 mg x 1 - Check daily INR - Check daily heparin level - Check daily CBC - Monitor signs/symptoms of bleeding   Sherren Kerns, PharmD PGY1 Acute Care Pharmacy Resident 08/14/2019

## 2019-08-14 NOTE — Progress Notes (Signed)
[facility] for Heparin Indication: atrial fibrillation  Allergies  Allergen Reactions  . Ace Inhibitors Other (See Comments)    Reaction:  Raises potassium   . Quinapril Rash and Other (See Comments)    hyperkalemia    Patient Measurements: Height: 6\' 2"  (188 cm) Weight: 202 lb 6.1 oz (91.8 kg) IBW/kg (Calculated) : 82.2 Heparin Dosing Weight: 90.2 kg  Vital Signs: Temp: 97.8 F (36.6 C) (10/31 1637) Temp Source: Oral (10/31 1637) BP: 133/50 (10/31 1637) Pulse Rate: 58 (10/31 1637)  Labs: Recent Labs    08/12/19 0410  08/12/19 0948  08/12/19 1505  08/13/19 0604 08/13/19 1604 08/13/19 2229 08/14/19 0244 08/14/19 1010 08/14/19 1539 08/14/19 1824  HGB  --   --  12.1*   < > 11.2*  --  10.1*  --   --  10.1*  --   --   --   HCT  --   --  35.8*   < > 34.4*  --  30.7*  --   --  30.5*  --   --   --   PLT  --    < > 188  --  182  --  120*  --   --  109*  --   --   --   APTT >200*  --   --   --   --   --  >200*  --   --   --   --   --   --   LABPROT  --   --  15.5*  --   --   --  16.1*  --   --  15.0  --   --   --   INR  --   --  1.2  --   --   --  1.3*  --   --  1.2  --   --   --   HEPARINUNFRC  --   --   --   --   --    < > 1.00*  --  0.53  --  0.29*  --  0.18*  CREATININE 2.86*  --   --   --  3.61*  --  5.26* 6.27*  --  4.70*  --  5.99*  --    < > = values in this interval not displayed.    Estimated Creatinine Clearance: 11.6 mL/min (A) (by C-G formula based on SCr of 5.99 mg/dL (H)).  Assessment: 34 YOM presented with NSTEMI, s/p PCI with DES to SVG-RCA on 79/48, complicated by focal perforation of the graft. Pt on DAPT w/ ASA and Brilinta, now in Afib and requiring anticoagulation. No AC prior to admission, pt has been receiving heparin through CRRT since 10/26 which is off as of 10/29. Pt will be transitioned to IHD.  Patient had recent episodes of nose bleed and blood from old arterial line, which has now resolved. Hemoglobin is  now stable at 10.1. Plts are low and continue to decrease at 109. 4T score is 3, which indicates a low risk of HIT.   Heparin level is sub-therapeutic and trending down further.  No issue with heparin infusion nor bleeding per RN.  Goal of Therapy:  INR 2-3 Heparin level 0.3-0.7 units/ml Monitor platelets by anticoagulation protocol: Yes   Plan:  Increase heparin gtt to 900 units/hr Check 8 hr heparin level  Jeffrey Graefe D. Mina Marble, PharmD, BCPS, [place] 08/14/2019, 7:48 PM

## 2019-08-14 NOTE — Progress Notes (Signed)
ANTICOAGULATION CONSULT NOTE - Follow Up Consult  Pharmacy Consult for heparin Indication: atrial fibrillation  Labs: Recent Labs    08/11/19 0500  08/12/19 0410 08/12/19 0948 08/12/19 1341 08/12/19 1505  08/12/19 1959 08/13/19 0604 08/13/19 1604 08/13/19 2229  HGB 11.5*  --   --  12.1* 10.9* 11.2*  --   --  10.1*  --   --   HCT 35.2*  --   --  35.8* 32.0* 34.4*  --   --  30.7*  --   --   PLT 210  --   --  188  --  182  --   --  120*  --   --   APTT >200*  --  >200*  --   --   --   --   --  >200*  --   --   LABPROT  --   --   --  15.5*  --   --   --   --  16.1*  --   --   INR  --   --   --  1.2  --   --   --   --  1.3*  --   --   HEPARINUNFRC  --   --   --   --   --   --    < > 1.04* 1.00*  --  0.53  CREATININE 3.58*   < > 2.86*  --   --  3.61*  --   --  5.26* 6.27*  --    < > = values in this interval not displayed.    Assessment: 78yo male had had heparin held for persistent oozing/bleeding from old A-line site; bleeding resolved after application of TR band, now deflated w/ okay from cards to resume heparin; of note heparin level was drawn earlier after heparin had already been held x1.5hr, resulted at goal but suspect true level at steady state was above goal prior to holding, reinforced by continued bleeding.  Goal of Therapy:  Heparin level 0.3-0.7 units/ml   Plan:  Will resume heparin at decreased rate of 650 units/hr and check level in 8 hours.    Wynona Neat, PharmD, BCPS  08/14/2019,2:35 AM

## 2019-08-14 NOTE — Progress Notes (Signed)
Progress Note  Patient Name: [name] Date of Encounter: 08/14/2019  Primary Cardiologist: Corey Skains, MD   Subjective   Feeling some discomfort around the catheter site.  No chest pain no shortness of breath  Inpatient Medications    Scheduled Meds: . aspirin EC  81 mg Oral Daily  . atorvastatin  80 mg Oral q1800  . Chlorhexidine Gluconate Cloth  6 each Topical Daily  . Chlorhexidine Gluconate Cloth  6 each Topical Q0600  . clopidogrel  75 mg Oral Daily  . coumadin book   Does not apply Once  . insulin aspart  0-15 Units Subcutaneous TID WC  . insulin aspart  0-5 Units Subcutaneous QHS  . insulin glargine  10 Units Subcutaneous BID  . levothyroxine  300 mcg Oral Q0600  . pantoprazole  40 mg Oral Daily  . sodium chloride flush  10-40 mL Intracatheter Q12H  . warfarin  5 mg Oral ONCE-1800  . Warfarin - Pharmacist Dosing Inpatient   Does not apply q1800   Continuous Infusions: . heparin 650 Units/hr (08/14/19 0250)   PRN Meds: acetaminophen, albuterol, food thickener, guaiFENesin, heparin, heparin, loperamide, nitroGLYCERIN, ondansetron (ZOFRAN) IV, Resource ThickenUp Clear, sodium chloride, sodium chloride flush   Vital Signs    Vitals:   08/13/19 2215 08/14/19 0539 08/14/19 0846 08/14/19 1115  BP: (!) 123/51 (!) 123/52 (!) 148/64 122/62  Pulse:  66 60 (!) 43  Resp:  17 (!) 22 (!) 21  Temp: 97.6 F (36.4 C) 98 F (36.7 C) 97.9 F (36.6 C)   TempSrc: Oral Oral Oral   SpO2:  94% 96% 92%  Weight:      Height:        Intake/Output Summary (Last 24 hours) at 08/14/2019 1229 Last data filed at 08/14/2019 0300 Gross per 24 hour  Intake 197.14 ml  Output 1536 ml  Net -1338.86 ml   Last 3 Weights 08/13/2019 08/13/2019 08/13/2019  Weight (lbs) 202 lb 6.1 oz 205 lb 14.6 oz 201 lb 4.5 oz  Weight (kg) 91.8 kg 93.4 kg 91.3 kg      Telemetry    Atrial fibrillation well rate controlled- Personally Reviewed  ECG    Atrial fibrillation- Personally  Reviewed  Physical Exam   GEN: No acute distress.   Neck: No JVD, right IJ dialysis catheter Cardiac: RRR, no murmurs, rubs, or gallops.  Respiratory: Clear to auscultation bilaterally. GI: Soft, nontender, non-distended  MS: No edema; No deformity. Neuro:  Nonfocal  Psych: Normal affect   Labs    High Sensitivity Troponin:   Recent Labs  Lab 08/04/19 2250 08/05/19 0043 08/05/19 0818 08/05/19 1624  TROPONINIHS 471* 507* 1,266* 1,123*      Chemistry Recent Labs  Lab 08/13/19 0604 08/13/19 1604 08/14/19 0244  NA 134* 130* 136  K 3.5 3.7 3.9  CL 100 97* 103  CO2 22 19* 21*  GLUCOSE 119* 333* 160*  BUN 45* 56* 38*  CREATININE 5.26* 6.27* 4.70*  CALCIUM 8.4* 8.3* 8.1*  ALBUMIN 2.1* 2.2* 2.1*  GFRNONAA 10* 8* 11*  GFRAA 11* 9* 13*  ANIONGAP 12 14 12      Hematology Recent Labs  Lab 08/12/19 1505 08/13/19 0604 08/14/19 0244  WBC 11.4* 11.2* 11.9*  RBC 3.46* 3.07* 3.12*  HGB 11.2* 10.1* 10.1*  HCT 34.4* 30.7* 30.5*  MCV 99.4 100.0 97.8  MCH 32.4 32.9 32.4  MCHC 32.6 32.9 33.1  RDW 16.0* 15.7* 15.7*  PLT 182 120* 109*    BNP  Recent Labs  Lab 08/12/19 1505  BNP 353.8*     DDimer No results for input(s): DDIMER in the last 168 hours.   Radiology    Dg Chest Port 1 View  Result Date: 08/12/2019 CLINICAL DATA:  Short of breath EXAM: PORTABLE CHEST 1 VIEW COMPARISON:  08/10/2019 FINDINGS: Right jugular central venous catheter tip proximal SVC unchanged. Prior CABG. Negative for heart failure. Elevated right hemidiaphragm with right lower lobe atelectasis improved from the prior study. No edema or effusion. Improved aeration in the left lung base. IMPRESSION: Improved aeration in the lung bases. Electronically Signed   By: Franchot Gallo M.D.   On: 08/12/2019 13:58   Dg Swallowing Func-speech Pathology  Result Date: 08/13/2019 Objective Swallowing Evaluation: Type of Study: MBS-Modified Barium Swallow Study  Patient Details Name: [name] MRN:  [id number] Date of Birth: Sep 25, 1941 Today's Date: 08/13/2019 Time: SLP Start Time (ACUTE ONLY): 1220 -SLP Stop Time (ACUTE ONLY): 1243 SLP Time Calculation (min) (ACUTE ONLY): 23 min Past Medical History: Past Medical History: Diagnosis Date . Blood transfusion without reported diagnosis   patient unaware of receiving blood unless it was during surgery and he was not told . [facility]) 2001  Colon resection . CAP (community acquired pneumonia) 08/11/2017 . Carotid stenosis, symptomatic, with infarction ([place]) 07/17/2017 . Chronic kidney disease 11/2017  stage IV CKD per nephrologist. . Complication of anesthesia   raspy voice since carotid endarterectomy 07/17/17. paralysis of vocal chords . Coronary artery disease  . Diabetes mellitus without complication ([place])  . Dysrhythmia   patient unaware of any irregular heart rhythms . Hyperlipidemia  . Hypertension  . Hypothyroidism  . Kidney mass 2019 . Myocardial infarction ([place]) 1997 . Peripheral vascular disease ([place])  . Pneumonia 2014, 2018  developed after surgery 2018 . Stroke ([place]) 07/2017  mild stroke and then had carotid endarterectomy . Thyroid disease  . Wears dentures   full upper . Weight loss 2019  patient has lost over 60 pounds since 07/2017.  Past Surgical History: Past Surgical History: Procedure Laterality Date . APPENDECTOMY  2011 . BACK SURGERY   . CARDIAC CATHETERIZATION  1997 . CAROTID ANGIOGRAPHY Right 06/13/2017  Procedure: Right subclavian and Carotid Angiography, possible intervention;  Surgeon: Algernon Huxley, MD;  Location: [facility];  Service: Cardiovascular;  Laterality: Right; . CATARACT EXTRACTION, BILATERAL   . CHOLECYSTECTOMY N/A 04/23/2016  had infection post surgery requiring him to debride daily . 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 COLONOSCOPY WITH PROPOFOL N/A 10/26/2018  Procedure: COLONOSCOPY WITH PROPOFOL;  Surgeon: Lucilla Lame, MD;  Location: [place];  Service:  Endoscopy;  Laterality: N/A;  Diabetic - insulin . CORONARY ARTERY BYPASS GRAFT  1997  x 3 . CORONARY STENT INTERVENTION N/A 08/06/2019  Procedure: CORONARY STENT INTERVENTION;  Surgeon: Nelva Bush, MD;  Location: [place] [facility];  Service: Cardiovascular;  Laterality: N/A; . CYSTOSCOPY W/ RETROGRADES Bilateral 09/03/2017  Procedure: CYSTOSCOPY WITH RETROGRADE PYELOGRAM;  Surgeon: Hollice Espy, MD;  Location: [facility];  Service: Urology;  Laterality: Bilateral; . CYSTOSCOPY W/ RETROGRADES Right 01/28/2018  Procedure: CYSTOSCOPY WITH RETROGRADE PYELOGRAM;  Surgeon: Hollice Espy, MD;  Location: [facility];  Service: Urology;  Laterality: Right; . CYSTOSCOPY W/ URETERAL STENT REMOVAL  08/2017 . CYSTOSCOPY WITH BIOPSY Right 01/28/2018  Procedure: CYSTOSCOPY WITH BIOPSY;  Surgeon: Hollice Espy, MD;  Location: [facility];  Service: Urology;  Laterality: Right; . CYSTOSCOPY WITH STENT PLACEMENT Right 09/03/2017  Procedure:  CYSTOSCOPY WITH STENT PLACEMENT;  Surgeon: Hollice Espy, MD;  Location: [facility];  Service: Urology;  Laterality: Right; . CYSTOSCOPY WITH STENT PLACEMENT Right 01/28/2018  Procedure: CYSTOSCOPY WITH STENT PLACEMENT;  Surgeon: Hollice Espy, MD;  Location: [facility];  Service: Urology;  Laterality: Right; . CYSTOSCOPY WITH URETEROSCOPY Right 01/28/2018  Procedure: CYSTOSCOPY WITH URETEROSCOPY;  Surgeon: Hollice Espy, MD;  Location: [facility];  Service: Urology;  Laterality: Right; . ENDARTERECTOMY Right 07/17/2017  Procedure: ENDARTERECTOMY CAROTID;  Surgeon: Algernon Huxley, MD;  Location: [facility];  Service: Vascular;  Laterality: Right; . HERNIA REPAIR  2011  Ventral hernia . HOLMIUM LASER APPLICATION N/A 65/12/5463  Procedure: HOLMIUM LASER APPLICATION;  Surgeon: Hollice Espy, MD;  Location: [facility];  Service: Urology;  Laterality: N/A; . KNEE SURGERY Left   arthroscopy . KYPHOPLASTY N/A 08/14/2017  Procedure: [id number];  Surgeon: Hessie Knows, MD;  Location: [facility];   Service: Orthopedics;  Laterality: N/A; . LARYNX SURGERY   . LEFT HEART CATH AND CORS/GRAFTS ANGIOGRAPHY N/A 08/06/2019  Procedure: LEFT HEART CATH AND CORS/GRAFTS ANGIOGRAPHY;  Surgeon: Corey Skains, MD;  Location: [place];  Service: Cardiovascular;  Laterality: N/A; . POLYPECTOMY  10/26/2018  Procedure: POLYPECTOMY INTESTINAL;  Surgeon: Lucilla Lame, MD;  Location: [place];  Service: Endoscopy;;  Descending colon polyp Transverse colon polyps x 3 . PROSTATE SURGERY  2002  BPH benign pathology . SPINE SURGERY  1989  Lumbar disc . THYROPLASTY Right 01/05/2018  Procedure: THYROPLASTY;  Surgeon: Beverly Gust, MD;  Location: [facility];  Service: ENT;  Laterality: Right; HPI: Pt is a 78 year old male admitted [facility] with NSTEMI s/p cardiac cath on 44/96, complicated by a small localized perforation of SVG during stent placement. He was subsequently transferred to [facility].  Pt developed progressive renal failure and associated respiratory failure. SLP was ordered as pt was noted to be coughing iwth meals. Pt had a R CEA in October 2018, after which he had hoarseness and trouble swallowing. [place] admission later that month for PNA, MBS was completed that showed adequate oropharyngeal swallow with no aspiration, occasional trace penetration with thin liquids. ENT was recommended due to thickening around the epiglottis. Previous BSE in 2017 also [facility]. 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 PMH also includes: stage IV CKD, weight loss, thyroid disease, CVA, MI, CAD, HTN, HLD, DM, colon ca s/p resection  Subjective: pt syas he is having more trouble swallowing than usual Assessment / Plan / Recommendation CHL IP CLINICAL IMPRESSIONS 08/13/2019 Clinical Impression Pt presents with silent aspiration with liquids regardless of consistency and bolus size that appears to be related to incomplet laryngeal vestibule closure. 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 SLP attempted various bolus delivery methods, bolus sizes, and positional strategies to try to increase airway protection. He had the most success when he used a head turn to the right, but he continue to have small amounts enter the airway unless he combined this with a spoonful of honey thick liquids. Pt also verbalized that he did not want to have solid foods, as subjectively this is what seemed hard at breakfast. Will start a Dys 1 (pureed) diet and honey thick liquids by spoon with a Right head turn. Pt will need to use a spoon-sized bolus and a right head turn or else he will still silently aspirate honey thick  liquids. Sign was made for reinforcement and instructions were reviewed with RN. SLP will contineu to follow acutely.  SLP Visit Diagnosis Dysphagia, pharyngeal phase (R13.13) Attention and concentration deficit following -- Frontal lobe and executive function deficit following -- Impact on safety and function Severe aspiration risk;Moderate aspiration risk   CHL IP TREATMENT RECOMMENDATION 08/13/2019 Treatment Recommendations Therapy as outlined in treatment plan below   Prognosis 08/13/2019 Prognosis for Safe Diet Advancement Fair Barriers to Reach Goals Time post onset Barriers/Prognosis Comment -- CHL IP DIET RECOMMENDATION 08/13/2019 SLP Diet Recommendations Dysphagia 1 (Puree) solids;Honey thick liquids Liquid Administration via Spoon Medication Administration Crushed with puree Compensations Slow rate;Small sips/bites;Other (Comment) Postural Changes Seated upright at 90 degrees   CHL IP OTHER RECOMMENDATIONS 08/13/2019 Recommended Consults -- Oral Care Recommendations Oral care BID Other Recommendations Order thickener from pharmacy;Prohibited food (jello, ice cream, thin soups);Remove water pitcher   CHL IP FOLLOW UP RECOMMENDATIONS 08/13/2019 Follow up Recommendations (No Data)   CHL IP  FREQUENCY AND DURATION 08/13/2019 Speech Therapy Frequency (ACUTE ONLY) min 2x/week Treatment Duration 2 weeks      CHL IP ORAL PHASE 08/13/2019 Oral Phase WFL Oral - Pudding Teaspoon -- Oral - Pudding Cup -- Oral - Honey Teaspoon -- Oral - Honey Cup -- Oral - Nectar Teaspoon -- Oral - Nectar Cup -- Oral - Nectar Straw -- Oral - Thin Teaspoon -- Oral - Thin Cup -- Oral - Thin Straw -- Oral - Puree -- Oral - Mech Soft -- Oral - Regular -- Oral - Multi-Consistency -- Oral - Pill -- Oral Phase - Comment --  CHL IP PHARYNGEAL PHASE 08/13/2019 Pharyngeal Phase Impaired Pharyngeal- Pudding Teaspoon -- Pharyngeal -- Pharyngeal- Pudding Cup -- Pharyngeal -- Pharyngeal- Honey Teaspoon Penetration/Aspiration during swallow Pharyngeal Material enters airway, passes BELOW cords without attempt by patient to eject out (silent aspiration) Pharyngeal- Honey Cup Penetration/Aspiration during swallow Pharyngeal Material enters airway, passes BELOW cords without attempt by patient to eject out (silent aspiration) Pharyngeal- Nectar Teaspoon -- Pharyngeal -- Pharyngeal- Nectar Cup Penetration/Aspiration during swallow Pharyngeal Material enters airway, passes BELOW cords without attempt by patient to eject out (silent aspiration) Pharyngeal- Nectar Straw Penetration/Aspiration during swallow Pharyngeal Material enters airway, passes BELOW cords without attempt by patient to eject out (silent aspiration) Pharyngeal- Thin Teaspoon -- Pharyngeal -- Pharyngeal- Thin Cup Penetration/Aspiration during swallow Pharyngeal Material enters airway, passes BELOW cords without attempt by patient to eject out (silent aspiration) Pharyngeal- Thin Straw Penetration/Aspiration during swallow Pharyngeal Material enters airway, passes BELOW cords without attempt by patient to eject out (silent aspiration) Pharyngeal- Puree WFL Pharyngeal -- Pharyngeal- Mechanical Soft WFL Pharyngeal -- Pharyngeal- Regular -- Pharyngeal -- Pharyngeal- Multi-consistency  -- Pharyngeal -- Pharyngeal- Pill -- Pharyngeal -- Pharyngeal Comment --  CHL IP CERVICAL ESOPHAGEAL PHASE 08/13/2019 Cervical Esophageal Phase WFL Pudding Teaspoon -- Pudding Cup -- Honey Teaspoon -- Honey Cup -- Nectar Teaspoon -- Nectar Cup -- Nectar Straw -- Thin Teaspoon -- Thin Cup -- Thin Straw -- Puree -- Mechanical Soft -- Regular -- Multi-consistency -- Pill -- Cervical Esophageal Comment -- Venita Sheffield Nix 08/13/2019, 2:17 PM  Pollyann Glen, M.A. CCC-SLP Acute Rehabilitation Services Pager [phone] Office [phone]              Cardiac Studies   Cardiac Catheterization and Percutaneous Coronary Intervention10.23.2020  Conclusions: 1. See diagnostic angiogram by Dr. Nehemiah Massed for details of coronary/graft anatomy. 2. Severe disease involving SVG-RCA with 80-90% stenosis. 3. Successful PCI to SVG-RCA using Resolute Onyx 4.0 x 38 mm drug-eluting  stent with 10% residual stenosis and TIMI-3 flow. 4. Focal perforation of the SVG-RCA during stent deployment. This was successfully contained with internal tamponade.  Recommendations: 1. Dual antiplatelet therapy with aspirin and ticagrelor for at least 12 months. 2. Aggressive secondary prevention. 3. Transfer to [facility] for ICU monitoring, given focal perforation of SVG-RCA. If the patient were to become hemodynamically unstable or develop worsening anemia, repeat angiography of SVG-RCA and possible covered stent placement would need to be considered. _____________  2D Echocardiogram10.24.2020 IMPRESSIONS  1. Left ventricular ejection fraction, by visual estimation, is 60 to 65%. The left ventricle has normal function. Normal left ventricular size. There is no left ventricular hypertrophy. 2. Global right ventricle has normal systolic function.The right ventricular size is normal. No increase in right ventricular wall thickness. 3. Left atrial size was normal. 4. Right atrial size was norma 5. Severe mitral  annular calcification. Mild calcification of the anterior mitral valve leaflet(s).. No evidence of mitral valve regurgitation. Mild mitral stenosis. 6. The tricuspid valve is normal in structure. Tricuspid valve regurgitation is mild. 7. The aortic valve is tricuspid Aortic valve regurgitation is mild by color flow Doppler. Moderate aortic valve sclerosis/calcification without any evidence of aortic stenosis. 8. The pulmonic valve was normal in structure. Pulmonic valve regurgitation is not visualized by color flow Doppler. 9. Normal pulmonary artery systolic pressure. 10. The inferior vena cava is normal in size with greater than 50% respiratory variability, suggesting right atrial pressure of 3 mmHg. 11. No evidence of pericardial effusion. _____________   Patient Profile     78 y.o. male with perforated SVG to RCA graft, non-STEMI coronary disease post CABG transferred from [place] with chronic kidney disease stage V, intermittent hemodialysis on triple therapy.  Assessment & Plan    Non-STEMI/CAD -Perforation SVG to RCA, contained. -Currently on triple therapy because of atrial fibrillation and recent stent deployment.  Now on aspirin Plavix and warfarin. -Would continue aspirin for 30 days post stent, 09/06/2019.  Then decrease to Plavix 75 mg and Coumadin thereafter.  Epistaxis -Patient was encouraged not to shove the suction catheter into his nostril by Dr. Debara Pickett.  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 Justin Mend and nephrology team. -Vascular surgery has been consulted by Dr. Justin Mend for internal catheter placement.  Atrial fibrillation/flutter with slow ventricular response -CHA2DS2-VASc score of 6.  No AV nodal blocking agents.  Heart rates currently reasonably controlled.  Hypotension -Arm cuff and arterial line were discrepant.   Diabetes with chronic kidney disease -Hemoglobin A1c 8.5.  Spoke to wife, patient and daughter who  works for an [employer] on the phone.  For questions or updates, please contact [place] Please consult [email or web address] for contact info under        Signed, Candee Furbish, MD  08/14/2019, 12:29 PM

## 2019-08-14 NOTE — Progress Notes (Signed)
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  Non-ST segment elevated MI I cardiac catheterization stent complicated by perforation.  Transferred to [facility] 08/09/2019.  Acute on chronic kidney injury Baseline serum creatinine 3.22 August 2018.  Patient initiated on CRRT 08/09/2019 -08/12/2019.   Intermittent hemodialysis 08/13/2019 with the ultrafiltration of 1 L  Blood pressure 148/64 pulse 60 temperature 97.9 O2 sats 94% room air  Sodium 136 potassium 3.9 chloride 103 CO2 21 BUN 38 creatinine 4.7 glucose 160 calcium 8.71 albumin 2.1 phosphorus 2.9 WBC 11.9 hemoglobin 10.1 platelets 109  Aspirin 81 mg daily atorvastatin 80 mg daily, levothyroxine 300 mcg daily Protonix 40 mg twice daily Brilinta 90 mg twice daily    Objective:  Vital signs in last 24 hours:  Temp:  [97.6 F (36.4 C)-98.3 F (36.8 C)] 97.9 F (36.6 C) (10/31 0846) Pulse Rate:  [52-114] 60 (10/31 0846) Resp:  [11-22] 22 (10/31 0846) BP: (94-148)/(42-64) 148/64 (10/31 0846) SpO2:  [93 %-96 %] 96 % (10/31 0846) Weight:  [91.8 kg-93.4 kg] 91.8 kg (10/30 2147)  Weight change:  Filed Weights   08/13/19 0702 08/13/19 1830 08/13/19 2147  Weight: 91.3 kg 93.4 kg 91.8 kg    Intake/Output: I/O last 3 completed shifts: In: 473.5 [P.O.:240; I.V.:233.5] Out: 1536 [Urine:575; Other:961]   Intake/Output this shift:  No intake/output data recorded.   General:  Resting comfortably nondistressed HEENT: MMM [name] AT anicteric sclera Neck:  JVP not elevated CV:   Regular rate and rhythm faint systolic murmur left sternal edge Lungs: Audible rails bilaterally Abd:  abd SNT/ND with normal BS Extremities no edema     Basic Metabolic Panel: Recent Labs  Lab 08/10/19 0247  08/11/19 0500  08/12/19 0410 08/12/19 1341 08/12/19 1505  08/13/19 0604 08/13/19 1604 08/14/19 0244  NA 133*   < > 134*   < > 132* 134* 132* 134* 130* 136  K 4.5   < > 3.6   < > 3.1* 3.8 3.9 3.5 3.7 3.9  CL 101   < > 101   < > 97*  --  101 100 97* 103  CO2 18*   < > 16*   < > 21*  --  21* 22 19* 21*  GLUCOSE 182*   < > 228*   < > 199*  --  269* 119* 333* 160*  BUN 60*   < > 39*   < > 24*  --  34* 45* 56* 38*  CREATININE 6.10*   < > 3.58*   < > 2.86*  --  3.61* 5.26* 6.27* 4.70*  CALCIUM 7.9*   < > 8.0*   < > 8.3*  --  8.6* 8.4* 8.3* 8.1*  MG 2.0  --  2.6*  --  2.7*  --   --  2.7*  --  2.4  PHOS 5.0*   < > 5.0*   < > 3.1  --  3.7 3.9 3.7 2.9   < > = values in this interval not displayed.    Liver Function Tests: Recent Labs  Lab 08/12/19 0410 08/12/19 1505 08/13/19 0604 08/13/19 1604 08/14/19 0244  ALBUMIN 2.4* 2.3* 2.1* 2.2* 2.1*   No results for input(s): LIPASE, AMYLASE in the last 168 hours. No results for input(s): AMMONIA in the last 168 hours.  CBC: Recent Labs  Lab 08/10/19 1031 08/11/19 0500  08/12/19 0948 08/12/19 1341 08/12/19 1505 08/13/19 0604 08/14/19 0244  WBC 20.7*  21.1* 15.9* 10.0  --  11.4* 11.2* 11.9*  NEUTROABS 19.3*  --   --   --   --   --   --   HGB 11.9*  11.9* 11.5* 12.1* 10.9* 11.2* 10.1* 10.1*  HCT 36.5*  35.8* 35.2* 35.8* 32.0* 34.4* 30.7* 30.5*  MCV 100.3*  98.6 99.2 97.0  --  99.4 100.0 97.8  PLT 179  174 210 188  --  182 120* 109*    Cardiac Enzymes: No results for input(s): CKTOTAL, CKMB, CKMBINDEX, TROPONINI in the last 168 hours.  BNP: Invalid input(s): POCBNP  CBG: Recent Labs  Lab 08/13/19 0653 08/13/19 1212 08/13/19 1616 08/13/19 2217 08/14/19 0603  GLUCAP 123* 200* 343* 94 180*    Microbiology: Results for orders placed or performed during the hospital encounter of 08/04/19  MRSA PCR Screening     Status: None   Collection Time: 08/04/19 10:47 PM   Specimen: Nasal Mucosa; Nasopharyngeal  Result Value Ref Range Status   MRSA by PCR NEGATIVE NEGATIVE Final     Comment:        The GeneXpert MRSA Assay (FDA approved for NASAL specimens only), is one component of a comprehensive MRSA colonization surveillance program. It is not intended to diagnose MRSA infection nor to guide or monitor treatment for MRSA infections. Performed at [facility], [address] [place], [place] [address]   SARS Coronavirus 2 by RT PCR (hospital order, performed in [facility] hospital lab) Nasopharyngeal Nasopharyngeal Swab     Status: None   Collection Time: 08/05/19 12:19 AM   Specimen: Nasopharyngeal Swab  Result Value Ref Range Status   SARS Coronavirus 2 NEGATIVE NEGATIVE Final    Comment: (NOTE) If result is NEGATIVE SARS-CoV-2 target nucleic acids are NOT DETECTED. The SARS-CoV-2 RNA is generally detectable in upper and lower  respiratory specimens during the acute phase of infection. The lowest  concentration of SARS-CoV-2 viral copies this assay can detect is 250  copies / mL. A negative result does not preclude SARS-CoV-2 infection  and should not be used as the sole basis for treatment or other  patient management decisions.  A negative result may occur with  improper specimen collection / handling, submission of specimen other  than nasopharyngeal swab, presence of viral mutation(s) within the  areas targeted by this assay, and inadequate number of viral copies  (<250 copies / mL). A negative result must be combined with clinical  observations, patient history, and epidemiological information. If result is POSITIVE SARS-CoV-2 target nucleic acids are DETECTED. The SARS-CoV-2 RNA is generally detectable in upper and lower  respiratory specimens dur ing the acute phase of infection.  Positive  results are indicative of active infection with SARS-CoV-2.  Clinical  correlation with patient history and other diagnostic information is  necessary to determine patient infection status.  Positive results do  not rule out bacterial infection  or co-infection with other viruses. If result is PRESUMPTIVE POSTIVE SARS-CoV-2 nucleic acids MAY BE PRESENT.   A presumptive positive result was obtained on the submitted specimen  and confirmed on repeat testing.  While 2019 novel coronavirus  (SARS-CoV-2) nucleic acids may be present in the submitted sample  additional confirmatory testing may be necessary for epidemiological  and / or clinical management purposes  to differentiate between  SARS-CoV-2 and other Sarbecovirus currently known to infect humans.  If clinically indicated additional testing with an  alternate test  methodology [phone]) is advised. The SARS-CoV-2 RNA is generally  detectable in upper and lower respiratory sp ecimens during the acute  phase of infection. The expected result is Negative. Fact Sheet for Patients:  StrictlyIdeas.no Fact Sheet for Healthcare Providers: BankingDealers.co.za This test is not yet approved or cleared by the [place] FDA and has been authorized for detection and/or diagnosis of SARS-CoV-2 by FDA under an Emergency Use Authorization (EUA).  This EUA will remain in effect (meaning this test can be used) for the duration of the COVID-19 declaration under Section 564(b)(1) of the Act, 21 U.S.C. section 360bbb-3(b)(1), unless the authorization is terminated or revoked sooner. Performed at [facility], [address] [place], [place] [address]     Coagulation Studies: Recent Labs    08/12/19 9024 08/13/19 0604 08/14/19 0244  LABPROT 15.5* 16.1* 15.0  INR 1.2 1.3* 1.2    Urinalysis: No results for input(s): COLORURINE, LABSPEC, PHURINE, GLUCOSEU, HGBUR, BILIRUBINUR, KETONESUR, PROTEINUR, UROBILINOGEN, NITRITE, LEUKOCYTESUR in the last 72 hours.  Invalid input(s): APPERANCEUR    Imaging: Dg Chest Port 1 View  Result Date: 08/12/2019 CLINICAL DATA:  Short of breath EXAM: PORTABLE CHEST 1 VIEW COMPARISON:   08/10/2019 FINDINGS: Right jugular central venous catheter tip proximal SVC unchanged. Prior CABG. Negative for heart failure. Elevated right hemidiaphragm with right lower lobe atelectasis improved from the prior study. No edema or effusion. Improved aeration in the left lung base. IMPRESSION: Improved aeration in the lung bases. Electronically Signed   By: Franchot Gallo M.D.   On: 08/12/2019 13:58   Dg Swallowing Func-speech Pathology  Result Date: 08/13/2019 Objective Swallowing Evaluation: Type of Study: MBS-Modified Barium Swallow Study  Patient Details Name: YASH CACCIOLA MRN: [id number] Date of Birth: 09-May-1941 Today's Date: 08/13/2019 Time: SLP Start Time (ACUTE ONLY): 1220 -SLP Stop Time (ACUTE ONLY): 1243 SLP Time Calculation (min) (ACUTE ONLY): 23 min Past Medical History: Past Medical History: Diagnosis Date . Blood transfusion without reported diagnosis   patient unaware of receiving blood unless it was during surgery and he was not told . Cancer [facility]) 2001  Colon resection . CAP (community acquired pneumonia) 08/11/2017 . Carotid stenosis, symptomatic, with infarction ([place]) 07/17/2017 . Chronic kidney disease 11/2017  stage IV CKD per nephrologist. . Complication of anesthesia   raspy voice since carotid endarterectomy 07/17/17. paralysis of vocal chords . Coronary artery disease  . Diabetes mellitus without complication ([place])  . Dysrhythmia   patient unaware of any irregular heart rhythms . Hyperlipidemia  . Hypertension  . Hypothyroidism  . Kidney mass 2019 . Myocardial infarction ([place]) 1997 . Peripheral vascular disease ([place])  . Pneumonia 2014, 2018  developed after surgery 2018 . Stroke ([place]) 07/2017  mild stroke and then had carotid endarterectomy . Thyroid disease  . Wears dentures   full upper . Weight loss 2019  patient has lost over 60 pounds since 07/2017.  Past Surgical History: Past Surgical History: Procedure Laterality Date . APPENDECTOMY  2011 . BACK SURGERY   . CARDIAC  CATHETERIZATION  1997 . CAROTID ANGIOGRAPHY Right 06/13/2017  Procedure: Right subclavian and Carotid Angiography, possible intervention;  Surgeon: Algernon Huxley, MD;  Location: [place] CV LAB;  Service: Cardiovascular;  Laterality: Right; . CATARACT EXTRACTION, BILATERAL   . CHOLECYSTECTOMY N/A 04/23/2016  had infection post surgery requiring him to debride daily . 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 COLONOSCOPY WITH PROPOFOL N/A 10/26/2018  Procedure: COLONOSCOPY WITH PROPOFOL;  Surgeon: Lucilla Lame, MD;  Location: [place];  Service: Endoscopy;  Laterality: N/A;  Diabetic - insulin . CORONARY ARTERY BYPASS GRAFT  1997  x 3 . CORONARY STENT INTERVENTION N/A 08/06/2019  Procedure: CORONARY STENT INTERVENTION;  Surgeon: Nelva Bush, MD;  Location: [place] CV LAB;  Service: Cardiovascular;  Laterality: N/A; . CYSTOSCOPY W/ RETROGRADES Bilateral 09/03/2017  Procedure: CYSTOSCOPY WITH RETROGRADE PYELOGRAM;  Surgeon: Hollice Espy, MD;  Location: [facility];  Service: Urology;  Laterality: Bilateral; . CYSTOSCOPY W/ RETROGRADES Right 01/28/2018  Procedure: CYSTOSCOPY WITH RETROGRADE PYELOGRAM;  Surgeon: Hollice Espy, MD;  Location: [facility];  Service: Urology;  Laterality: Right; . CYSTOSCOPY W/ URETERAL STENT REMOVAL  08/2017 . CYSTOSCOPY WITH BIOPSY Right 01/28/2018  Procedure: CYSTOSCOPY WITH BIOPSY;  Surgeon: Hollice Espy, MD;  Location: [facility];  Service: Urology;  Laterality: Right; . CYSTOSCOPY WITH STENT PLACEMENT Right 09/03/2017  Procedure: CYSTOSCOPY WITH STENT PLACEMENT;  Surgeon: Hollice Espy, MD;  Location: [facility];  Service: Urology;  Laterality: Right; . CYSTOSCOPY WITH STENT PLACEMENT Right 01/28/2018  Procedure: CYSTOSCOPY WITH STENT PLACEMENT;  Surgeon: Hollice Espy, MD;  Location: [facility];  Service: Urology;  Laterality: Right; . CYSTOSCOPY WITH URETEROSCOPY Right 01/28/2018  Procedure: CYSTOSCOPY WITH URETEROSCOPY;   Surgeon: Hollice Espy, MD;  Location: [facility];  Service: Urology;  Laterality: Right; . ENDARTERECTOMY Right 07/17/2017  Procedure: ENDARTERECTOMY CAROTID;  Surgeon: Algernon Huxley, MD;  Location: [facility];  Service: Vascular;  Laterality: Right; . HERNIA REPAIR  2011  Ventral hernia . HOLMIUM LASER APPLICATION N/A 02/40/9735  Procedure: HOLMIUM LASER APPLICATION;  Surgeon: Hollice Espy, MD;  Location: [facility];  Service: Urology;  Laterality: N/A; . KNEE SURGERY Left   arthroscopy . KYPHOPLASTY N/A 08/14/2017  Procedure: [id number];  Surgeon: Hessie Knows, MD;  Location: [facility];  Service: Orthopedics;  Laterality: N/A; . LARYNX SURGERY   . LEFT HEART CATH AND CORS/GRAFTS ANGIOGRAPHY N/A 08/06/2019  Procedure: LEFT HEART CATH AND CORS/GRAFTS ANGIOGRAPHY;  Surgeon: Corey Skains, MD;  Location: [place] [facility];  Service: Cardiovascular;  Laterality: N/A; . POLYPECTOMY  10/26/2018  Procedure: POLYPECTOMY INTESTINAL;  Surgeon: Lucilla Lame, MD;  Location: [place];  Service: Endoscopy;;  Descending colon polyp Transverse colon polyps x 3 . PROSTATE SURGERY  2002  BPH benign pathology . SPINE SURGERY  1989  Lumbar disc . THYROPLASTY Right 01/05/2018  Procedure: THYROPLASTY;  Surgeon: Beverly Gust, MD;  Location: [facility];  Service: ENT;  Laterality: Right; HPI: Pt is a 78 year old male admitted to [facility] with NSTEMI s/p cardiac cath on 22/29, complicated by a small localized perforation of SVG during stent placement. He was subsequently transferred to [facility].  Pt developed progressive renal failure and associated respiratory failure. SLP was ordered as pt was noted to be coughing iwth meals. Pt had a R CEA in October 2018, after which he had hoarseness and trouble swallowing. [place] admission later that month for PNA, MBS was completed that showed adequate oropharyngeal swallow with no aspiration, occasional trace penetration with thin liquids. ENT was recommended due to  thickening around the epiglottis. Previous BSE in 2017 also [facility]. 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 PMH also includes: stage IV CKD, weight loss, thyroid disease, CVA, MI, CAD, HTN, HLD, DM, colon ca s/p resection  Subjective: pt syas he is having more trouble swallowing than usual Assessment / Plan / Recommendation CHL IP CLINICAL IMPRESSIONS 08/13/2019 Clinical Impression Pt presents with silent aspiration with  liquids regardless of consistency and bolus size that appears to be related to incomplet laryngeal vestibule closure. 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 SLP attempted various bolus delivery methods, bolus sizes, and positional strategies to try to increase airway protection. He had the most success when he used a head turn to the right, but he continue to have small amounts enter the airway unless he combined this with a spoonful of honey thick liquids. Pt also verbalized that he did not want to have solid foods, as subjectively this is what seemed hard at breakfast. Will start a Dys 1 (pureed) diet and honey thick liquids by spoon with a Right head turn. Pt will need to use a spoon-sized bolus and a right head turn or else he will still silently aspirate honey thick liquids. Sign was made for reinforcement and instructions were reviewed with RN. SLP will contineu to follow acutely.  SLP Visit Diagnosis Dysphagia, pharyngeal phase (R13.13) Attention and concentration deficit following -- Frontal lobe and executive function deficit following -- Impact on safety and function Severe aspiration risk;Moderate aspiration risk   CHL IP TREATMENT RECOMMENDATION 08/13/2019 Treatment Recommendations Therapy as outlined in treatment plan below   Prognosis 08/13/2019 Prognosis for Safe Diet Advancement Fair Barriers to Reach Goals Time post onset Barriers/Prognosis Comment -- CHL  IP DIET RECOMMENDATION 08/13/2019 SLP Diet Recommendations Dysphagia 1 (Puree) solids;Honey thick liquids Liquid Administration via Spoon Medication Administration Crushed with puree Compensations Slow rate;Small sips/bites;Other (Comment) Postural Changes Seated upright at 90 degrees   CHL IP OTHER RECOMMENDATIONS 08/13/2019 Recommended Consults -- Oral Care Recommendations Oral care BID Other Recommendations Order thickener from pharmacy;Prohibited food (jello, ice cream, thin soups);Remove water pitcher   CHL IP FOLLOW UP RECOMMENDATIONS 08/13/2019 Follow up Recommendations (No Data)   CHL IP FREQUENCY AND DURATION 08/13/2019 Speech Therapy Frequency (ACUTE ONLY) min 2x/week Treatment Duration 2 weeks      CHL IP ORAL PHASE 08/13/2019 Oral Phase WFL Oral - Pudding Teaspoon -- Oral - Pudding Cup -- Oral - Honey Teaspoon -- Oral - Honey Cup -- Oral - Nectar Teaspoon -- Oral - Nectar Cup -- Oral - Nectar Straw -- Oral - Thin Teaspoon -- Oral - Thin Cup -- Oral - Thin Straw -- Oral - Puree -- Oral - Mech Soft -- Oral - Regular -- Oral - Multi-Consistency -- Oral - Pill -- Oral Phase - Comment --  CHL IP PHARYNGEAL PHASE 08/13/2019 Pharyngeal Phase Impaired Pharyngeal- Pudding Teaspoon -- Pharyngeal -- Pharyngeal- Pudding Cup -- Pharyngeal -- Pharyngeal- Honey Teaspoon Penetration/Aspiration during swallow Pharyngeal Material enters airway, passes BELOW cords without attempt by patient to eject out (silent aspiration) Pharyngeal- Honey Cup Penetration/Aspiration during swallow Pharyngeal Material enters airway, passes BELOW cords without attempt by patient to eject out (silent aspiration) Pharyngeal- Nectar Teaspoon -- Pharyngeal -- Pharyngeal- Nectar Cup Penetration/Aspiration during swallow Pharyngeal Material enters airway, passes BELOW cords without attempt by patient to eject out (silent aspiration) Pharyngeal- Nectar Straw Penetration/Aspiration during swallow Pharyngeal Material enters airway, passes BELOW  cords without attempt by patient to eject out (silent aspiration) Pharyngeal- Thin Teaspoon -- Pharyngeal -- Pharyngeal- Thin Cup Penetration/Aspiration during swallow Pharyngeal Material enters airway, passes BELOW cords without attempt by patient to eject out (silent aspiration) Pharyngeal- Thin Straw Penetration/Aspiration during swallow Pharyngeal Material enters airway, passes BELOW cords without attempt by patient to eject out (silent aspiration) Pharyngeal- Puree WFL Pharyngeal -- Pharyngeal- Mechanical  Soft WFL Pharyngeal -- Pharyngeal- Regular -- Pharyngeal -- Pharyngeal- Multi-consistency -- Pharyngeal -- Pharyngeal- Pill -- Pharyngeal -- Pharyngeal Comment --  CHL IP CERVICAL ESOPHAGEAL PHASE 08/13/2019 Cervical Esophageal Phase WFL Pudding Teaspoon -- Pudding Cup -- Honey Teaspoon -- Honey Cup -- Nectar Teaspoon -- Nectar Cup -- Nectar Straw -- Thin Teaspoon -- Thin Cup -- Thin Straw -- Puree -- Mechanical Soft -- Regular -- Multi-consistency -- Pill -- Cervical Esophageal Comment -- Venita Sheffield Nix 08/13/2019, 2:17 PM  Pollyann Glen, M.A. [facility] Pager [phone] Office [phone]               Medications:   . heparin 650 Units/hr (08/14/19 0250)   . aspirin EC  81 mg Oral Daily  . atorvastatin  80 mg Oral q1800  . Chlorhexidine Gluconate Cloth  6 each Topical Daily  . Chlorhexidine Gluconate Cloth  6 each Topical Q0600  . clopidogrel  75 mg Oral Daily  . coumadin book   Does not apply Once  . insulin aspart  0-15 Units Subcutaneous TID WC  . insulin aspart  0-5 Units Subcutaneous QHS  . insulin glargine  10 Units Subcutaneous BID  . levothyroxine  300 mcg Oral Q0600  . pantoprazole  40 mg Oral Daily  . sodium chloride flush  10-40 mL Intracatheter Q12H  . Warfarin - Pharmacist Dosing Inpatient   Does not apply q1800   acetaminophen, albuterol, food thickener, guaiFENesin, heparin, heparin, loperamide, nitroGLYCERIN, ondansetron (ZOFRAN) IV, Resource  ThickenUp Clear, sodium chloride, sodium chloride flush  Assessment/ Plan:   Chronic kidney disease stage IV followed at [facility] last renal ultrasound revealed medical renal disease both kidneys no evidence of renal mass or hydronephrosis.  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  Repeat ultrasound ordered 08/10/2019.  Urine microscopy revealed 21-50 WBCs per high-powered film 6-10 RBCs per high-powered film.  Greater than 300 mg proteinuria.  I would consider Foley trauma as a cause of his microscopic hematuria.  I doubt this represents acute interstitial nephritis or acute glomerulonephritis at this point.  Acute kidney injury most likely related related to hemodynamics contrast-induced nephropathy with marked worsening of renal function.  Possible thromboembolic disease.  Urgent dialysis required dialysis catheter placed 08/09/2019 with initiation of CRRT -continues to be oliguric  .  Stopped CRRT 08/12/2019.  Tolerated intermittent hemodialysis 08/12/2029.  He has a right nontunneled dialysis catheter.  This will need to be converted to a tunneled dialysis catheter with placement of AV fistula will consult V VS 08/16/2019  Acute congestive heart failure with most recent echocardiogram revealing normal ejection fraction followed by cardiology.  Transition to intermittent hemodialysis.  Atrial fibrillation now placed on heparin drip not on antihypertensives secondary to hypotension and bradycardia  NSTEMI status post PCI DES to SVG-RCA 73/22/0254 complicated by focal perforation of graft.  Appreciate assistance from cardiology  Hypotension   A-line seems to be monitoring blood pressures considerably higher than cuff.  Weaned off I Levophed  Lipidemia continue statin  Diabetes mellitus per primary team.   LOS: [place] @TODAY @10 :30 AM

## 2019-08-15 LAB — RENAL FUNCTION PANEL
Albumin: 2.1 g/dL — ABNORMAL LOW (ref 3.5–5.0)
Albumin: 2.1 g/dL — ABNORMAL LOW (ref 3.5–5.0)
Anion gap: 14 (ref 5–15)
Anion gap: 14 (ref 5–15)
BUN: 54 mg/dL — ABNORMAL HIGH (ref 8–23)
BUN: 59 mg/dL — ABNORMAL HIGH (ref 8–23)
CO2: 20 mmol/L — ABNORMAL LOW (ref 22–32)
CO2: 20 mmol/L — ABNORMAL LOW (ref 22–32)
Calcium: 8.3 mg/dL — ABNORMAL LOW (ref 8.9–10.3)
Calcium: 8.3 mg/dL — ABNORMAL LOW (ref 8.9–10.3)
Chloride: 102 mmol/L (ref 98–111)
Chloride: 101 mmol/L (ref 98–111)
Creatinine, Ser: 6.8 mg/dL — ABNORMAL HIGH (ref 0.61–1.24)
Creatinine, Ser: 7.54 mg/dL — ABNORMAL HIGH (ref 0.61–1.24)
GFR calc Af Amer: 8 mL/min — ABNORMAL LOW (ref >60)
GFR calc Af Amer: 7 mL/min — ABNORMAL LOW (ref >60)
GFR calc non Af Amer: 7 mL/min — ABNORMAL LOW (ref >60)
GFR calc non Af Amer: 6 mL/min — ABNORMAL LOW (ref >60)
Glucose, Bld: 207 mg/dL — ABNORMAL HIGH (ref 70–99)
Glucose, Bld: 242 mg/dL — ABNORMAL HIGH (ref 70–99)
Phosphorus: 4 mg/dL (ref 2.5–4.6)
Phosphorus: 3.5 mg/dL (ref 2.5–4.6)
Potassium: 3.9 mmol/L (ref 3.5–5.1)
Potassium: 3.9 mmol/L (ref 3.5–5.1)
Sodium: 136 mmol/L (ref 135–145)
Sodium: 135 mmol/L (ref 135–145)

## 2019-08-15 LAB — CBC
HCT: 28.7 % — ABNORMAL LOW (ref 39.0–52.0)
Hemoglobin: 9.2 g/dL — ABNORMAL LOW (ref 13.0–17.0)
MCH: 32.1 pg (ref 26.0–34.0)
MCHC: 32.1 g/dL (ref 30.0–36.0)
MCV: 100 fL (ref 80.0–100.0)
Platelets: 122 10*3/uL — ABNORMAL LOW (ref 150–400)
RBC: 2.87 MIL/uL — ABNORMAL LOW (ref 4.22–5.81)
RDW: 15.8 % — ABNORMAL HIGH (ref 11.5–15.5)
WBC: 12.8 10*3/uL — ABNORMAL HIGH (ref 4.0–10.5)
nRBC: 0.2 % (ref 0.0–0.2)

## 2019-08-15 LAB — GLUCOSE, CAPILLARY
Glucose-Capillary: 198 mg/dL — ABNORMAL HIGH (ref 70–99)
Glucose-Capillary: 364 mg/dL — ABNORMAL HIGH (ref 70–99)
Glucose-Capillary: 222 mg/dL — ABNORMAL HIGH (ref 70–99)
Glucose-Capillary: 195 mg/dL — ABNORMAL HIGH (ref 70–99)

## 2019-08-15 LAB — PROTIME-INR
INR: 2.4 — ABNORMAL HIGH (ref 0.8–1.2)
Prothrombin Time: 25.8 seconds — ABNORMAL HIGH (ref 11.4–15.2)

## 2019-08-15 LAB — HEPARIN LEVEL (UNFRACTIONATED)
Heparin Unfractionated: 0.4 IU/mL (ref 0.30–0.70)
Heparin Unfractionated: 0.52 IU/mL (ref 0.30–0.70)

## 2019-08-15 LAB — MAGNESIUM: Magnesium: 2.4 mg/dL (ref 1.7–2.4)

## 2019-08-15 MED ORDER — CHLORHEXIDINE GLUCONATE CLOTH 2 % EX PADS: 6.0000 | MEDICATED_PAD | Freq: Every day | CUTANEOUS | Status: DC

## 2019-08-15 MED ORDER — FLEET ENEMA 7-19 GM/118ML RE ENEM
1.0000 | ENEMA | Freq: Once | RECTAL | Status: AC
  Administered 2019-08-15: 1 via RECTAL
  Filled 2019-08-15: qty 1

## 2019-08-15 NOTE — Progress Notes (Signed)
  Speech Language Pathology Treatment: Dysphagia  Patient Details Name: [name] MRN: [id number] DOB: 1940/11/23 Today's Date: 08/15/2019 Time: 9622-2979 SLP Time Calculation (min) (ACUTE ONLY): 23 min  Assessment / Plan / Recommendation Clinical Impression  F/u after Friday's MBS.  [name] present.  Reviewed results of MBS.  Showed [name] how to mix thickener with liquid to appropriate consistency.  She return-demonstrated process.  Pt required review of strategies - he fed himself thickened liquids via spoon, with head turned to right, with only initial cues necessary. There were no overt s/s of aspiration, however, aspiration events on MBS were silent in nature.  Pt/wife were able to describe in some more detail the procedure that the pt had to improve his voice - thyroplasty - which they described as effective for phonation.  We discussed the possibility that this acute illness caused him to decompensate and that hopefully, as overall endurance and strength improve, so will his swallowing.  SLP will continue to follow; pt may need f/u with his ENT at some point.   HPI HPI: Pt is a 78 year old male admitted to [facility] with NSTEMI s/p cardiac cath on 89/21, complicated by a small localized perforation of SVG during stent placement. He was subsequently transferred to [facility].  Pt developed progressive renal failure and associated respiratory failure. SLP was ordered as pt was noted to be coughing iwth meals. Pt had a R CEA in October 2018, after which he had hoarseness and trouble swallowing. [place] admission later that month for PNA, MBS was completed that showed adequate oropharyngeal swallow with no aspiration, occasional trace penetration with thin liquids. ENT performed right thyroplasty 01/05/18, which improved vocal quality, but pt reports no improvements in swallowing. Previous BSE in 2017 also [facility]. PMH also includes: stage IV CKD, weight loss, thyroid disease, CVA, MI, CAD, HTN,  HLD, DM, colon ca s/p resection      SLP Plan  Continue with current plan of care       Recommendations  Diet recommendations: Dysphagia 1 (puree);Honey-thick liquid Liquids provided via: Teaspoon(head turn to right) Medication Administration: Crushed with puree Supervision: Patient able to self feed Compensations: Slow rate;Small sips/bites;Other (Comment)(head turn to the right; honey thick by spoon only)                Oral Care Recommendations: Oral care BID Follow up Recommendations: Other (comment)(tba) SLP Visit Diagnosis: Dysphagia, pharyngeal phase (R13.13) Plan: Continue with current plan of care       Okay. [name], [place] CCC/SLP Acute Rehabilitation Services Office number [phone] Pager [phone]  [name] 08/15/2019, 3:31 PM

## 2019-08-15 NOTE — Progress Notes (Signed)
Patient in pain since shift change due to impaction. Oncall physician notified and order for fleets enema given, however pharmacy advises they have stat event and not fully staffed, therefore unfortunately medication will be delayed. Advised patient, will continue to monitor.

## 2019-08-15 NOTE — Progress Notes (Signed)
ANTICOAGULATION CONSULT NOTE - Initial Consult  Pharmacy Consult for Heparin and warfarin Indication: atrial fibrillation  Allergies  Allergen Reactions  . Ace Inhibitors Other (See Comments)    Reaction:  Raises potassium   . Quinapril Rash and Other (See Comments)    hyperkalemia    Patient Measurements: Height: 6\' 2"  (188 cm) Weight: 203 lb 11.3 oz (92.4 kg) IBW/kg (Calculated) : 82.2 Heparin Dosing Weight: 90.2 kg  Vital Signs: Temp: 97.9 F (36.6 C) (11/01 1156) Temp Source: Oral (11/01 1156) BP: 143/50 (11/01 1156) Pulse Rate: 58 (11/01 1156)  Labs: Recent Labs    08/13/19 0604  08/14/19 0244  08/14/19 1539 08/14/19 1824 08/15/19 0410 08/15/19 1055  HGB 10.1*  --  10.1*  --   --   --  9.2*  --   HCT 30.7*  --  30.5*  --   --   --  28.7*  --   PLT 120*  --  109*  --   --   --  122*  --   APTT >200*  --   --   --   --   --   --   --   LABPROT 16.1*  --  15.0  --   --   --  25.8*  --   INR 1.3*  --  1.2  --   --   --  2.4*  --   HEPARINUNFRC 1.00*   < >  --    < >  --  0.18* 0.40 0.52  CREATININE 5.26*   < > 4.70*  --  5.99*  --  6.80*  --    < > = values in this interval not displayed.    Estimated Creatinine Clearance: 10.2 mL/min (A) (by C-G formula based on SCr of 6.8 mg/dL (H)).   Medical History: Past Medical History:  Diagnosis Date  . Blood transfusion without reported diagnosis    patient unaware of receiving blood unless it was during surgery and he was not told  . Cancer [facility]) 2001   Colon resection  . CAP (community acquired pneumonia) 08/11/2017  . Carotid stenosis, symptomatic, with infarction ([place]) 07/17/2017  . Chronic kidney disease 11/2017   stage IV CKD per nephrologist.  . Complication of anesthesia    raspy voice since carotid endarterectomy 07/17/17. paralysis of vocal chords  . Coronary artery disease   . Diabetes mellitus without complication ([place])   . Dysrhythmia    patient unaware of any irregular heart rhythms  .  Hyperlipidemia   . Hypertension   . Hypothyroidism   . Kidney mass 2019  . Myocardial infarction ([place]) 1997  . Peripheral vascular disease ([place])   . Pneumonia 2014, 2018   developed after surgery 2018  . Stroke ([place]) 07/2017   mild stroke and then had carotid endarterectomy  . Thyroid disease   . Wears dentures    full upper  . Weight loss 2019   patient has lost over 60 pounds since 07/2017.     Medications:  Scheduled:  . aspirin EC  81 mg Oral Daily  . atorvastatin  80 mg Oral q1800  . Chlorhexidine Gluconate Cloth  6 each Topical Daily  . Chlorhexidine Gluconate Cloth  6 each Topical Q0600  . Chlorhexidine Gluconate Cloth  6 each Topical Q0600  . clopidogrel  75 mg Oral Daily  . coumadin book   Does not apply Once  . insulin aspart  0-15 Units Subcutaneous TID WC  . insulin  aspart  0-5 Units Subcutaneous QHS  . insulin glargine  10 Units Subcutaneous BID  . levothyroxine  300 mcg Oral Q0600  . pantoprazole  40 mg Oral Daily  . sodium chloride flush  10-40 mL Intracatheter Q12H  . Warfarin - Pharmacist Dosing Inpatient   Does not apply q1800    Assessment: 6 YOM presented with NSTEMI, s/p PCI with DES to SVG-RCA on 86/16, complicated by focal perforation of the graft. Pt on DAPT w/ ASA and Brilinta, now in Afib and requiring anticoagulation. No AC prior to admission, pt has been receiving heparin through CRRT since 10/26 which is off as of 10/29. Pt will be transitioned to IHD.  Heparin level is only therapeutic at 0.52. Patient had recent episodes of nose bleed and blood from old arterial line, which has now resolved. Hemoglobin is now stable at 9.2. Plts are low but increasing to 122. No signs/symptoms of bleeding or problems with infusion per RN.  INR today is 2.4, up from 1.2 yesterday, which is a very large jump.  Goal of Therapy:  INR 2-3 Heparin level 0.3-0.7 units/ml Monitor platelets by anticoagulation protocol: Yes   Plan:  - Decrease heparin to 850  units/hr - Hold warfarin tonight - Check daily INR - Check daily heparin level - Check daily CBC - Monitor signs/symptoms of bleeding   Sherren Kerns, PharmD PGY1 Acute Care Pharmacy Resident 08/15/2019

## 2019-08-15 NOTE — Progress Notes (Signed)
Physical Therapy Treatment Patient Details Name: [name] MRN: [id number] DOB: 01/24/1941 Today's Date: 08/15/2019    History of Present Illness Pt is a 78 year old gentleman with a history of coronary artery status post CABG 1997, diabetes, hypertension, hyperlipidemia, chronic kidney disease stage IV at baseline who presents with non-ST segment elevated MI I s/p cardiac catheterization stent complicated by perforation.  Transferred to [facility] 08/09/2019.  Acute on chronic kidney injury sustained requiring CRRT 08/09/2019-08/12/2019.    PT Comments    Continuing work on functional mobility and activity tolerance;  Notable slow, but steady progress with functional mobility and gentle progressive amb; Will continue to follow for progress; Still worth considering CIR for post-acute rehab when medically ready -- he was independent and active prior to this admission, he is new to dialysis (which increases his medical complexity), and has good rehab potential; Noted REhab admissions Coordinator is recommending PM&R Consult to consider CIR stay;   Notably decr SBP today after walking compared to BP prior to in room amb; Consider taking orthostatic BPs next session    Follow Up Recommendations  CIR;Supervision/Assistance - 24 hour     Equipment Recommendations  Rolling walker with 5" wheels;3in1 (PT)    Recommendations for Other Services Rehab consult;OT consult     Precautions / Restrictions Precautions Precautions: Fall Precaution Comments: Consider taking orthostatics next session    Mobility  Bed Mobility Overal bed mobility: Needs Assistance Bed Mobility: Supine to Sit     Supine to sit: Min guard     General bed mobility comments: Use of bed rail, min guard assist for safety  Transfers Overall transfer level: Needs assistance Equipment used: Rolling walker (2 wheeled) Transfers: Sit to/from Stand Sit to Stand: Min assist;From elevated surface          General transfer comment: Cues for hand placement and safety; Reported feeling weak and needing to sit down within 10 -15 seconds of standing, so sat back to bed for a bit, then min assist to stand from slightly elevated bed second time  Ambulation/Gait Ambulation/Gait assistance: Min assist Gait Distance (Feet): 12 Feet(around bed to chair) Assistive device: Rolling walker (2 wheeled) Gait Pattern/deviations: Step-through pattern;Decreased stride length;Trunk flexed     General Gait Details: Cues to self-monitor for activity tolerance; noting fatigue   Stairs             Wheelchair Mobility    Modified Rankin (Stroke Patients Only)       Balance     Sitting balance-Leahy Scale: Fair       Standing balance-Leahy Scale: Poor                              Cognition Arousal/Alertness: Awake/alert Behavior During Therapy: WFL for tasks assessed/performed Overall Cognitive Status: Within Functional Limits for tasks assessed                                        Exercises      General Comments General comments (skin integrity, edema, etc.): Took BP at end of session, and noted it to be low compared to PB prior to session at 114/49; RN notified; O2 sats in the low 90s      Pertinent Vitals/Pain Pain Assessment: No/denies pain    Home Living  Prior Function            PT Goals (current goals can now be found in the care plan section) Acute Rehab PT Goals Patient Stated Goal: "get better." PT Goal Formulation: With patient Time For Goal Achievement: 08/27/19 Potential to Achieve Goals: Good Progress towards PT goals: Progressing toward goals(slow but steady progress)    Frequency    Min 3X/week      PT Plan Current plan remains appropriate    Co-evaluation              AM-PAC PT "6 Clicks" Mobility   Outcome Measure  Help needed turning from your back to your side while in a flat  bed without using bedrails?: None Help needed moving from lying on your back to sitting on the side of a flat bed without using bedrails?: A Little Help needed moving to and from a bed to a chair (including a wheelchair)?: A Little Help needed standing up from a chair using your arms (e.g., wheelchair or bedside chair)?: A Lot Help needed to walk in hospital room?: A Little Help needed climbing 3-5 steps with a railing? : Total 6 Click Score: 16    End of Session Equipment Utilized During Treatment: Gait belt Activity Tolerance: Patient tolerated treatment well Patient left: in chair;with chair alarm set;with call bell/phone within reach;with family/visitor present Nurse Communication: Mobility status PT Visit Diagnosis: Muscle weakness (generalized) (M62.81);Difficulty in walking, not elsewhere classified (R26.2)     Time: 9163-8466 PT Time Calculation (min) (ACUTE ONLY): 25 min  Charges:  $Gait Training: 8-22 mins $Therapeutic Activity: 8-22 mins                     Roney Marion, PT  Acute Rehabilitation Services Pager [phone] Office [place] 08/15/2019, 11:27 AM

## 2019-08-15 NOTE — Progress Notes (Signed)
ANTICOAGULATION CONSULT NOTE - Follow Up Consult  Pharmacy Consult for heparin Indication: atrial fibrillation   Labs: Recent Labs    08/13/19 0604  08/14/19 0244 08/14/19 1010 08/14/19 1539 08/14/19 1824 08/15/19 0410  HGB 10.1*  --  10.1*  --   --   --  9.2*  HCT 30.7*  --  30.5*  --   --   --  28.7*  PLT 120*  --  109*  --   --   --  122*  APTT >200*  --   --   --   --   --   --   LABPROT 16.1*  --  15.0  --   --   --  25.8*  INR 1.3*  --  1.2  --   --   --  2.4*  HEPARINUNFRC 1.00*   < >  --  0.29*  --  0.18* 0.40  CREATININE 5.26*   < > 4.70*  --  5.99*  --  6.80*   < > = values in this interval not displayed.    Assessment/Plan:  78yo male therapeutic on heparin after rate change. Will continue gtt at current rate and confirm stable with additional level.   Wynona Neat, PharmD, BCPS  08/15/2019,5:52 AM

## 2019-08-15 NOTE — Progress Notes (Signed)
Progress Note  Patient Name: ARIAH MCDANIEL Date of Encounter: 08/15/2019  Primary Cardiologist: Lamar Blinks, MD   Subjective   No chest pain no shortness of breath.  Feeling fatigued.  He was sitting in the chair for an hour and a half today.  Inpatient Medications    Scheduled Meds: . aspirin EC  81 mg Oral Daily  . atorvastatin  80 mg Oral q1800  . Chlorhexidine Gluconate Cloth  6 each Topical Daily  . Chlorhexidine Gluconate Cloth  6 each Topical Q0600  . Chlorhexidine Gluconate Cloth  6 each Topical Q0600  . clopidogrel  75 mg Oral Daily  . coumadin book   Does not apply Once  . insulin aspart  0-15 Units Subcutaneous TID WC  . insulin aspart  0-5 Units Subcutaneous QHS  . insulin glargine  10 Units Subcutaneous BID  . levothyroxine  300 mcg Oral Q0600  . pantoprazole  40 mg Oral Daily  . sodium chloride flush  10-40 mL Intracatheter Q12H  . Warfarin - Pharmacist Dosing Inpatient   Does not apply q1800   Continuous Infusions: . heparin 900 Units/hr (08/15/19 0940)   PRN Meds: acetaminophen, albuterol, food thickener, guaiFENesin, heparin, heparin, loperamide, nitroGLYCERIN, ondansetron (ZOFRAN) IV, Resource ThickenUp Clear, sodium chloride, sodium chloride flush   Vital Signs    Vitals:   08/14/19 1637 08/14/19 2035 08/15/19 0409 08/15/19 0747  BP: (!) 133/50 (!) 149/94 (!) 144/50 (!) 144/57  Pulse: (!) 58 (!) 52 (!) 45 61  Resp: 17 20 17 16   Temp: 97.8 F (36.6 C) 98.3 F (36.8 C) 98.1 F (36.7 C) 97.9 F (36.6 C)  TempSrc: Oral Oral Oral Oral  SpO2: 93% 94% 94% 93%  Weight:   92.4 kg   Height:        Intake/Output Summary (Last 24 hours) at 08/15/2019 1048 Last data filed at 08/15/2019 6295 Gross per 24 hour  Intake 491.25 ml  Output 250 ml  Net 241.25 ml   Last 3 Weights 08/15/2019 08/13/2019 08/13/2019  Weight (lbs) 203 lb 11.3 oz 202 lb 6.1 oz 205 lb 14.6 oz  Weight (kg) 92.4 kg 91.8 kg 93.4 kg      Telemetry    Atrial  fibrillation/flutter heart rate 60s- Personally Reviewed  ECG    Atrial fibrillation- Personally Reviewed  Physical Exam   GEN: Well nourished, well developed, in no acute distress  HEENT: normal  Neck: no JVD, carotid bruits, or masses, right IJ dialysis catheter Cardiac: RRR; no murmurs, rubs, or gallops,no edema  Respiratory:  clear to auscultation bilaterally, normal work of breathing GI: soft, nontender, nondistended, + BS MS: no deformity or atrophy  Skin: warm and dry, no rash Neuro:  Alert and Oriented x 3, Strength and sensation are intact Psych: euthymic mood, full affect   Labs    High Sensitivity Troponin:   Recent Labs  Lab 08/04/19 2250 08/05/19 0043 08/05/19 0818 08/05/19 1624  TROPONINIHS 471* 507* 1,266* 1,123*      Chemistry Recent Labs  Lab 08/14/19 0244 08/14/19 1539 08/15/19 0410  NA 136 135 136  K 3.9 4.1 3.9  CL 103 104 102  CO2 21* 18* 20*  GLUCOSE 160* 319* 207*  BUN 38* 46* 54*  CREATININE 4.70* 5.99* 6.80*  CALCIUM 8.1* 7.9* 8.3*  ALBUMIN 2.1* 2.0* 2.1*  GFRNONAA 11* 8* 7*  GFRAA 13* 9* 8*  ANIONGAP 12 13 14      Hematology Recent Labs  Lab 08/13/19 0604 08/14/19 0244  08/15/19 0410  WBC 11.2* 11.9* 12.8*  RBC 3.07* 3.12* 2.87*  HGB 10.1* 10.1* 9.2*  HCT 30.7* 30.5* 28.7*  MCV 100.0 97.8 100.0  MCH 32.9 32.4 32.1  MCHC 32.9 33.1 32.1  RDW 15.7* 15.7* 15.8*  PLT 120* 109* 122*    BNP Recent Labs  Lab 08/12/19 1505  BNP 353.8*     DDimer No results for input(s): DDIMER in the last 168 hours.   Radiology    Dg Swallowing Func-speech Pathology  Result Date: 08/13/2019 Objective Swallowing Evaluation: Type of Study: MBS-Modified Barium Swallow Study  Patient Details Name: PHILLIPE EBERHARDT MRN: [id number] Date of Birth: [birth year] Today's Date: 08/13/2019 Time: SLP Start Time (ACUTE ONLY): 1220 -SLP Stop Time (ACUTE ONLY): 1243 SLP Time Calculation (min) (ACUTE ONLY): 23 min Past Medical History: Past Medical History:  Diagnosis Date . Blood transfusion without reported diagnosis   patient unaware of receiving blood unless it was during surgery and he was not told . Cancer [employer]) 2001  Colon resection . CAP (community acquired pneumonia) 08/11/2017 . Carotid stenosis, symptomatic, with infarction (HCC) 07/17/2017 . Chronic kidney disease 11/2017  stage IV CKD per nephrologist. . Complication of anesthesia   raspy voice since carotid endarterectomy 07/17/17. paralysis of vocal chords . Coronary artery disease  . Diabetes mellitus without complication (HCC)  . Dysrhythmia   patient unaware of any irregular heart rhythms . Hyperlipidemia  . Hypertension  . Hypothyroidism  . Kidney mass 2019 . Myocardial infarction (HCC) 1997 . Peripheral vascular disease (HCC)  . Pneumonia 2014, 2018  developed after surgery 2018 . Stroke (HCC) 07/2017  mild stroke and then had carotid endarterectomy . Thyroid disease  . Wears dentures   full upper . Weight loss 2019  patient has lost over 60 pounds since 07/2017.  Past Surgical History: Past Surgical History: Procedure Laterality Date . APPENDECTOMY  2011 . BACK SURGERY   . CARDIAC CATHETERIZATION  1997 . CAROTID ANGIOGRAPHY Right 06/13/2017  Procedure: Right subclavian and Carotid Angiography, possible intervention;  Surgeon: Annice Needy, MD;  Location: [facility]VASIVE CV [facility];  Service: Cardiovascular;  Laterality: Right; . CATARACT EXTRACTION, BILATERAL   . CHOLECYSTECTOMY N/A 04/23/2016  had infection post surgery requiring him to debride daily . 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 COLONOSCOPY WITH PROPOFOL N/A 10/26/2018  Procedure: COLONOSCOPY WITH PROPOFOL;  Surgeon: Midge Minium, MD;  Location: [facility];  Service: Endoscopy;  Laterality: N/A;  Diabetic - insulin . CORONARY ARTERY BYPASS GRAFT  1997  x 3 . CORONARY STENT INTERVENTION N/A 08/06/2019  Procedure: CORONARY STENT INTERVENTION;  Surgeon: Yvonne Kendall, MD;  Location: [facility] INV[facility]  CV LAB;  Service: Cardiovascular;  Laterality: N/A; . CYSTOSCOPY W/ RETROGRADES Bilateral 09/03/2017  Procedure: CYSTOSCOPY WITH RETROGRADE PYELOGRAM;  Surgeon: Vanna Scotland, MD;  Location: [facility] OR[facility];  Service: Urology;  Laterality: Bilateral; . CYSTOSCOPY W/ RETROGRADES Right 01/28/2018  Procedure: CYSTOSCOPY WITH RETROGRADE PYELOGRAM;  Surgeon: Vanna Scotland, MD;  Location: [facility] OR[facility];  Service: Urology;  Laterality: Right; . CYSTOSCOPY W/ URETERAL STENT REMOVAL  08/2017 . CYSTOSCOPY WITH BIOPSY Right 01/28/2018  Procedure: CYSTOSCOPY WITH BIOPSY;  Surgeon: Vanna Scotland, MD;  Location: [facility];  Service: Urology;  Laterality: Right; . CYSTOSCOPY WITH STENT PLACEMENT Right 09/03/2017  Procedure: CYSTOSCOPY WITH STENT PLACEMENT;  Surgeon: Vanna Scotland, MD;  Location: [facility];  Service: Urology;  Laterality: Right; . CYSTOSCOPY WITH STENT PLACEMENT Right 01/28/2018  Procedure: CYSTOSCOPY WITH STENT PLACEMENT;  Surgeon: Apolinar Junes,  Morrie Sheldon, MD;  Location: [facility];  Service: Urology;  Laterality: Right; . CYSTOSCOPY WITH URETEROSCOPY Right 01/28/2018  Procedure: CYSTOSCOPY WITH URETEROSCOPY;  Surgeon: Vanna Scotland, MD;  Location: [facility];  Service: Urology;  Laterality: Right; . ENDARTERECTOMY Right 07/17/2017  Procedure: ENDARTERECTOMY CAROTID;  Surgeon: Annice Needy, MD;  Location: [facility] OR[facility];  Service: Vascular;  Laterality: Right; . HERNIA REPAIR  2011  Ventral hernia . HOLMIUM LASER APPLICATION N/A 09/03/2017  Procedure: HOLMIUM LASER APPLICATION;  Surgeon: Vanna Scotland, MD;  Location: [facility];  Service: Urology;  Laterality: N/A; . KNEE SURGERY Left   arthroscopy . KYPHOPLASTY N/A 08/14/2017  Procedure: [id number];  Surgeon: Kennedy Bucker, MD;  Location: [facility];  Service: Orthopedics;  Laterality: N/A; . LARYNX SURGERY   . LEFT HEART CATH AND CORS/GRAFTS ANGIOGRAPHY N/A 08/06/2019  Procedure: LEFT HEART CATH AND CORS/GRAFTS ANGIOGRAPHY;  Surgeon: Lamar Blinks, MD;  Location: [facility] CV  [facility];  Service: Cardiovascular;  Laterality: N/A; . POLYPECTOMY  10/26/2018  Procedure: POLYPECTOMY INTESTINAL;  Surgeon: Midge Minium, MD;  Location: [facility];  Service: Endoscopy;;  Descending colon polyp Transverse colon polyps x 3 . PROSTATE SURGERY  2002  BPH benign pathology . SPINE SURGERY  1989  Lumbar disc . THYROPLASTY Right 01/05/2018  Procedure: THYROPLASTY;  Surgeon: Linus Salmons, MD;  Location: [facility] ORS;  Service: ENT;  Laterality: Right; HPI: Pt is a 78 year old male admitted [facility] with NSTEMI s/p cardiac cath on 10/23, complicated by a small localized perforation of SVG during stent placement. He was subsequently transferred to [facility].  Pt developed progressive renal failure and associated respiratory failure. SLP was ordered as pt was noted to be coughing iwth meals. Pt had a R CEA in October 2018, after which he had hoarseness and trouble swallowing. Dusing admission later that month for PNA, MBS was completed that showed adequate oropharyngeal swallow with no aspiration, occasional trace penetration with thin liquids. ENT was recommended due to thickening around the epiglottis. Previous BSE in 2017 also [facility]. 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 PMH also includes: stage IV CKD, weight loss, thyroid disease, CVA, MI, CAD, HTN, HLD, DM, colon ca s/p resection  Subjective: pt syas he is having more trouble swallowing than usual Assessment / Plan / Recommendation CHL IP CLINICAL IMPRESSIONS 08/13/2019 Clinical Impression Pt presents with silent aspiration with liquids regardless of consistency and bolus size that appears to be related to incomplet laryngeal vestibule closure. 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 SLP attempted various bolus delivery methods, bolus sizes, and positional strategies to try to increase airway protection. He had  the most success when he used a head turn to the right, but he continue to have small amounts enter the airway unless he combined this with a spoonful of honey thick liquids. Pt also verbalized that he did not want to have solid foods, as subjectively this is what seemed hard at breakfast. Will start a Dys 1 (pureed) diet and honey thick liquids by spoon with a Right head turn. Pt will need to use a spoon-sized bolus and a right head turn or else he will still silently aspirate honey thick liquids. Sign was made for reinforcement and instructions were reviewed with RN. SLP will contineu to follow acutely.  SLP Visit Diagnosis Dysphagia, pharyngeal phase (R13.13) Attention and concentration deficit following -- Frontal lobe and  executive function deficit following -- Impact on safety and function Severe aspiration risk;Moderate aspiration risk   CHL IP TREATMENT RECOMMENDATION 08/13/2019 Treatment Recommendations Therapy as outlined in treatment plan below   Prognosis 08/13/2019 Prognosis for Safe Diet Advancement Fair Barriers to Reach Goals Time post onset Barriers/Prognosis Comment -- CHL IP DIET RECOMMENDATION 08/13/2019 SLP Diet Recommendations Dysphagia 1 (Puree) solids;Honey thick liquids Liquid Administration via Spoon Medication Administration Crushed with puree Compensations Slow rate;Small sips/bites;Other (Comment) Postural Changes Seated upright at 90 degrees   CHL IP OTHER RECOMMENDATIONS 08/13/2019 Recommended Consults -- Oral Care Recommendations Oral care BID Other Recommendations Order thickener from pharmacy;Prohibited food (jello, ice cream, thin soups);Remove water pitcher   CHL IP FOLLOW UP RECOMMENDATIONS 08/13/2019 Follow up Recommendations (No Data)   CHL IP FREQUENCY AND DURATION 08/13/2019 Speech Therapy Frequency (ACUTE ONLY) min 2x/week Treatment Duration 2 weeks      CHL IP ORAL PHASE 08/13/2019 Oral Phase WFL Oral - Pudding Teaspoon -- Oral - Pudding Cup -- Oral - Honey Teaspoon -- Oral  - Honey Cup -- Oral - Nectar Teaspoon -- Oral - Nectar Cup -- Oral - Nectar Straw -- Oral - Thin Teaspoon -- Oral - Thin Cup -- Oral - Thin Straw -- Oral - Puree -- Oral - Mech Soft -- Oral - Regular -- Oral - Multi-Consistency -- Oral - Pill -- Oral Phase - Comment --  CHL IP PHARYNGEAL PHASE 08/13/2019 Pharyngeal Phase Impaired Pharyngeal- Pudding Teaspoon -- Pharyngeal -- Pharyngeal- Pudding Cup -- Pharyngeal -- Pharyngeal- Honey Teaspoon Penetration/Aspiration during swallow Pharyngeal Material enters airway, passes BELOW cords without attempt by patient to eject out (silent aspiration) Pharyngeal- Honey Cup Penetration/Aspiration during swallow Pharyngeal Material enters airway, passes BELOW cords without attempt by patient to eject out (silent aspiration) Pharyngeal- Nectar Teaspoon -- Pharyngeal -- Pharyngeal- Nectar Cup Penetration/Aspiration during swallow Pharyngeal Material enters airway, passes BELOW cords without attempt by patient to eject out (silent aspiration) Pharyngeal- Nectar Straw Penetration/Aspiration during swallow Pharyngeal Material enters airway, passes BELOW cords without attempt by patient to eject out (silent aspiration) Pharyngeal- Thin Teaspoon -- Pharyngeal -- Pharyngeal- Thin Cup Penetration/Aspiration during swallow Pharyngeal Material enters airway, passes BELOW cords without attempt by patient to eject out (silent aspiration) Pharyngeal- Thin Straw Penetration/Aspiration during swallow Pharyngeal Material enters airway, passes BELOW cords without attempt by patient to eject out (silent aspiration) Pharyngeal- Puree WFL Pharyngeal -- Pharyngeal- Mechanical Soft WFL Pharyngeal -- Pharyngeal- Regular -- Pharyngeal -- Pharyngeal- Multi-consistency -- Pharyngeal -- Pharyngeal- Pill -- Pharyngeal -- Pharyngeal Comment --  CHL IP CERVICAL ESOPHAGEAL PHASE 08/13/2019 Cervical Esophageal Phase WFL Pudding Teaspoon -- Pudding Cup -- Honey Teaspoon -- Honey Cup -- Nectar Teaspoon -- Nectar  Cup -- Nectar Straw -- Thin Teaspoon -- Thin Cup -- Thin Straw -- Puree -- Mechanical Soft -- Regular -- Multi-consistency -- Pill -- Cervical Esophageal Comment -- Virl Axe Nix 08/13/2019, 2:17 PM  Ivar Drape, M.A. CCC-SLP Acute Rehabilitation Services Pager [phone] Office [phone]              Cardiac Studies   Cardiac Catheterization and Percutaneous Coronary Intervention10.23.2020  Conclusions: 1. See diagnostic angiogram by Dr. Gwen Pounds for details of coronary/graft anatomy. 2. Severe disease involving SVG-RCA with 80-90% stenosis. 3. Successful PCI to SVG-RCA using Resolute Onyx 4.0 x 38 mm drug-eluting stent with 10% residual stenosis and TIMI-3 flow. 4. Focal perforation of the SVG-RCA during stent deployment. This was successfully contained with internal tamponade.  Recommendations: 1. Dual antiplatelet therapy with aspirin and ticagrelor for  at least 12 months. 2. Aggressive secondary prevention. 3. Transfer to [facility] for ICU monitoring, given focal perforation of SVG-RCA. If the patient were to become hemodynamically unstable or develop worsening anemia, repeat angiography of SVG-RCA and possible covered stent placement would need to be considered. _____________  2D Echocardiogram10.24.2020 IMPRESSIONS  1. Left ventricular ejection fraction, by visual estimation, is 60 to 65%. The left ventricle has normal function. Normal left ventricular size. There is no left ventricular hypertrophy. 2. Global right ventricle has normal systolic function.The right ventricular size is normal. No increase in right ventricular wall thickness. 3. Left atrial size was normal. 4. Right atrial size was norma 5. Severe mitral annular calcification. Mild calcification of the anterior mitral valve leaflet(s).. No evidence of mitral valve regurgitation. Mild mitral stenosis. 6. The tricuspid valve is normal in structure. Tricuspid valve regurgitation is mild. 7.  The aortic valve is tricuspid Aortic valve regurgitation is mild by color flow Doppler. Moderate aortic valve sclerosis/calcification without any evidence of aortic stenosis. 8. The pulmonic valve was normal in structure. Pulmonic valve regurgitation is not visualized by color flow Doppler. 9. Normal pulmonary artery systolic pressure. 10. The inferior vena cava is normal in size with greater than 50% respiratory variability, suggesting right atrial pressure of 3 mmHg. 11. No evidence of pericardial effusion. _____________   Patient Profile     78 y.o. male with perforated SVG to RCA graft, non-STEMI coronary disease post CABG transferred from [place] with chronic kidney disease stage V, intermittent hemodialysis on triple therapy.  Assessment & Plan    Non-STEMI/CAD -Perforation SVG to RCA, contained. -Currently on triple therapy because of atrial fibrillation and recent stent deployment.  Now on aspirin Plavix and warfarin. -Would continue aspirin for 30 days post stent, 09/06/2019.  Then decrease to Plavix 75 mg and Coumadin thereafter.  No changes today.  No chest pain.  Epistaxis -Patient was encouraged not to shove the suction catheter into his nostril by Dr. Rennis Golden.  Seems to be stable at this point.  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 Hyman Hopes and nephrology team. -Vascular surgery has been consulted by Dr. Hyman Hopes for tunneled catheter placement.  Plan for dialysis on Monday, vascular tunneled catheter Tuesday -He is concerned when the catheter is being tugged on at dialysis, states that it is uncomfortable.  Atrial fibrillation/flutter with slow ventricular response -CHA2DS2-VASc score of 6.  No AV nodal blocking agents.  Heart rates currently reasonably controlled. On IV heparin  Hypotension -Arm cuff and arterial line were discrepant.   Diabetes with chronic kidney disease -Hemoglobin A1c 8.5.  Spoke to wife, patient and daughter  who works for an [employer] on the phone yesterday.  For questions or updates, please contact [facility] Please consult [email or web address] for contact info under        Signed, Donato Schultz, MD  08/15/2019, 10:48 AM

## 2019-08-15 NOTE — Progress Notes (Signed)
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  Non-ST segment elevated MI I cardiac catheterization with stent placement  complicated by perforation.  Transferred to [facility] 08/09/2019.  He is continued on aspirin until 09/06/2019 and then will continue Plavix 75 mg daily.  He developed acute on chronic kidney injury with a baseline serum creatinine 3.22 August 2018.  Patient initiated on CRRT 08/09/2019 -08/12/2019.   Intermittent hemodialysis 08/13/2019 with the ultrafiltration of 1 L.Marland Kitchen  His next dialysis treatment with 08/16/2019  Blood pressure 102/52 pulse 63 temperature 97.9 O2 sats 90% room air  Minimal urine output  Sodium 136 potassium 3.9 chloride 102 CO2 20 BUN 34 creatinine 6.8 calcium 8.3 phosphorus 4.0 magnesium 2.4 albumin 2.1 WBC 12.8 hemoglobin 9.2 platelets 122  Aspirin 81 mg daily atorvastatin 80 mg daily, levothyroxine 300 mcg daily Protonix 40 mg twice daily Plavix 75 mg daily    Objective:  Vital signs in last 24 hours:  Temp:  [97.8 F (36.6 C)-98.3 F (36.8 C)] 97.9 F (36.6 C) (11/01 0747) Pulse Rate:  [43-61] 61 (11/01 0747) Resp:  [16-21] 16 (11/01 0747) BP: (122-149)/(50-94) 144/57 (11/01 0747) SpO2:  [92 %-94 %] 93 % (11/01 0747) Weight:  [92.4 kg] 92.4 kg (11/01 0409)  Weight change: 1.1 kg Filed Weights   08/13/19 1830 08/13/19 2147 08/15/19 0409  Weight: 93.4 kg 91.8 kg 92.4 kg    Intake/Output: I/O last 3 completed shifts: In: 354.7 [P.O.:150; I.V.:204.7] Out: 1786 [Urine:825; Other:961]   Intake/Output this shift:  Total I/O In: 160 [P.O.:160] Out: -    General:  Resting comfortably nondistressed HEENT: MMM [place] AT anicteric sclera Neck:  JVP not elevated CV:   Regular rate and rhythm faint systolic murmur left sternal edge Lungs: Audible rails  bilaterally Abd:  abd SNT/ND with normal BS Extremities no edema     Basic Metabolic Panel: Recent Labs  Lab 08/11/19 0500  08/12/19 0410  08/13/19 0604 08/13/19 1604 08/14/19 0244 08/14/19 1539 08/15/19 0410  NA 134*   < > 132*   < > 134* 130* 136 135 136  K 3.6   < > 3.1*   < > 3.5 3.7 3.9 4.1 3.9  CL 101   < > 97*   < > 100 97* 103 104 102  CO2 16*   < > 21*   < > 22 19* 21* 18* 20*  GLUCOSE 228*   < > 199*   < > 119* 333* 160* 319* 207*  BUN 39*   < > 24*   < > 45* 56* 38* 46* 54*  CREATININE 3.58*   < > 2.86*   < > 5.26* 6.27* 4.70* 5.99* 6.80*  CALCIUM 8.0*   < > 8.3*   < > 8.4* 8.3* 8.1* 7.9* 8.3*  MG 2.6*  --  2.7*  --  2.7*  --  2.4  --  2.4  PHOS 5.0*   < > 3.1   < > 3.9 3.7 2.9 3.2 4.0   < > = values in this interval not displayed.    Liver Function Tests: Recent Labs  Lab 08/13/19 0604 08/13/19 1604 08/14/19 0244 08/14/19 1539 08/15/19 0410  ALBUMIN 2.1* 2.2* 2.1* 2.0* 2.1*   No results for input(s): LIPASE, AMYLASE in the last 168 hours. No results for input(s): AMMONIA in the last 168 hours.  CBC: Recent Labs  Lab 08/10/19 1031  08/12/19 0948 08/12/19 1341 08/12/19 1505 08/13/19 0604 08/14/19 0244 08/15/19 0410  WBC 20.7*  21.1*   < > 10.0  --  11.4* 11.2* 11.9* 12.8*  NEUTROABS 19.3*  --   --   --   --   --   --   --   HGB 11.9*  11.9*   < > 12.1* 10.9* 11.2* 10.1* 10.1* 9.2*  HCT 36.5*  35.8*   < > 35.8* 32.0* 34.4* 30.7* 30.5* 28.7*  MCV 100.3*  98.6   < > 97.0  --  99.4 100.0 97.8 100.0  PLT 179  174   < > 188  --  182 120* 109* 122*   < > = values in this interval not displayed.    Cardiac Enzymes: No results for input(s): CKTOTAL, CKMB, CKMBINDEX, TROPONINI in the last 168 hours.  BNP: Invalid input(s): POCBNP  CBG: Recent Labs  Lab 08/14/19 0603 08/14/19 1111 08/14/19 1636 08/14/19 2053 08/15/19 0635  GLUCAP 180* 186* 300* 329* 198*    Microbiology: Results for orders placed or performed during the hospital  encounter of 08/04/19  MRSA PCR Screening     Status: None   Collection Time: 08/04/19 10:47 PM   Specimen: Nasal Mucosa; Nasopharyngeal  Result Value Ref Range Status   MRSA by PCR NEGATIVE NEGATIVE Final    Comment:        The GeneXpert MRSA Assay (FDA approved for NASAL specimens only), is one component of a comprehensive MRSA colonization surveillance program. It is not intended to diagnose MRSA infection nor to guide or monitor treatment for MRSA infections. Performed at [facility], [address] [place], [place] [address]   SARS Coronavirus 2 by RT PCR (hospital order, performed in [facility] hospital lab) Nasopharyngeal Nasopharyngeal Swab     Status: None   Collection Time: 08/05/19 12:19 AM   Specimen: Nasopharyngeal Swab  Result Value Ref Range Status   SARS Coronavirus 2 NEGATIVE NEGATIVE Final    Comment: (NOTE) If result is NEGATIVE SARS-CoV-2 target nucleic acids are NOT DETECTED. The SARS-CoV-2 RNA is generally detectable in upper and lower  respiratory specimens during the acute phase of infection. The lowest  concentration of SARS-CoV-2 viral copies this assay can detect is 250  copies / mL. A negative result does not preclude SARS-CoV-2 infection  and should not be used as the sole basis for treatment or other  patient management decisions.  A negative result may occur with  improper specimen collection / handling, submission of specimen other  than nasopharyngeal swab, presence of viral mutation(s) within the  areas targeted by this assay, and inadequate number of viral copies  (<250 copies / mL). A negative result must be combined with clinical  observations, patient history, and epidemiological information. If result is POSITIVE SARS-CoV-2 target nucleic acids are DETECTED. The SARS-CoV-2 RNA is generally detectable in upper and lower  respiratory specimens dur ing the acute phase of infection.  Positive  results are indicative of active  infection with SARS-CoV-2.  Clinical  correlation with patient history and other diagnostic information is  necessary to determine patient infection status.  Positive results do  not rule out bacterial infection or co-infection with other viruses. If result is PRESUMPTIVE POSTIVE SARS-CoV-2 nucleic acids MAY BE PRESENT.   A presumptive positive result was obtained on the submitted specimen  and confirmed on repeat testing.  While 2019 novel coronavirus  (SARS-CoV-2) nucleic acids may  be present in the submitted sample  additional confirmatory testing may be necessary for epidemiological  and / or clinical management purposes  to differentiate between  SARS-CoV-2 and other Sarbecovirus currently known to infect humans.  If clinically indicated additional testing with an alternate test  methodology [phone]) is advised. The SARS-CoV-2 RNA is generally  detectable in upper and lower respiratory sp ecimens during the acute  phase of infection. The expected result is Negative. Fact Sheet for Patients:  StrictlyIdeas.no Fact Sheet for Healthcare Providers: BankingDealers.co.za This test is not yet approved or cleared by the [place] FDA and has been authorized for detection and/or diagnosis of SARS-CoV-2 by FDA under an Emergency Use Authorization (EUA).  This EUA will remain in effect (meaning this test can be used) for the duration of the COVID-19 declaration under Section 564(b)(1) of the Act, 21 U.S.C. section 360bbb-3(b)(1), unless the authorization is terminated or revoked sooner. Performed at [facility], [address] [place], [place] [address]     Coagulation Studies: Recent Labs    08/13/19 0604 08/14/19 0244 08/15/19 0410  LABPROT 16.1* 15.0 25.8*  INR 1.3* 1.2 2.4*    Urinalysis: No results for input(s): COLORURINE, LABSPEC, PHURINE, GLUCOSEU, HGBUR, BILIRUBINUR, KETONESUR, PROTEINUR, UROBILINOGEN,  NITRITE, LEUKOCYTESUR in the last 72 hours.  Invalid input(s): APPERANCEUR    Imaging: Dg Swallowing Func-speech Pathology  Result Date: 08/13/2019 Objective Swallowing Evaluation: Type of Study: MBS-Modified Barium Swallow Study  Patient Details Name: MARQUELLE MUSGRAVE MRN: [id number] Date of Birth: December 26, 1940 Today's Date: 08/13/2019 Time: SLP Start Time (ACUTE ONLY): 1220 -SLP Stop Time (ACUTE ONLY): 8676 SLP Time Calculation (min) (ACUTE ONLY): 23 min Past Medical History: Past Medical History: Diagnosis Date . Blood transfusion without reported diagnosis   patient unaware of receiving blood unless it was during surgery and he was not told . Cancer [facility]) 2001  Colon resection . CAP (community acquired pneumonia) 08/11/2017 . Carotid stenosis, symptomatic, with infarction ([place]) 07/17/2017 . Chronic kidney disease 11/2017  stage IV CKD per nephrologist. . Complication of anesthesia   raspy voice since carotid endarterectomy 07/17/17. paralysis of vocal chords . Coronary artery disease  . Diabetes mellitus without complication ([place])  . Dysrhythmia   patient unaware of any irregular heart rhythms . Hyperlipidemia  . Hypertension  . Hypothyroidism  . Kidney mass 2019 . Myocardial infarction ([place]) 1997 . Peripheral vascular disease ([place])  . Pneumonia 2014, 2018  developed after surgery 2018 . Stroke ([place]) 07/2017  mild stroke and then had carotid endarterectomy . Thyroid disease  . Wears dentures   full upper . Weight loss 2019  patient has lost over 60 pounds since 07/2017.  Past Surgical History: Past Surgical History: Procedure Laterality Date . APPENDECTOMY  2011 . BACK SURGERY   . CARDIAC CATHETERIZATION  1997 . CAROTID ANGIOGRAPHY Right 06/13/2017  Procedure: Right subclavian and Carotid Angiography, possible intervention;  Surgeon: Algernon Huxley, MD;  Location: [place] [facility];  Service: Cardiovascular;  Laterality: Right; . CATARACT EXTRACTION, BILATERAL   . CHOLECYSTECTOMY N/A 04/23/2016  had infection  post surgery requiring him to debride daily . 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 COLONOSCOPY WITH PROPOFOL N/A 10/26/2018  Procedure: COLONOSCOPY WITH PROPOFOL;  Surgeon: Lucilla Lame, MD;  Location: [place];  Service: Endoscopy;  Laterality: N/A;  Diabetic - insulin . CORONARY ARTERY BYPASS GRAFT  1997  x 3 . CORONARY STENT INTERVENTION N/A 08/06/2019  Procedure: CORONARY STENT INTERVENTION;  Surgeon: Nelva Bush, MD;  Location: [place] [facility];  Service: Cardiovascular;  Laterality: N/A; . CYSTOSCOPY W/ RETROGRADES Bilateral 09/03/2017  Procedure: CYSTOSCOPY WITH RETROGRADE PYELOGRAM;  Surgeon: Hollice Espy, MD;  Location: [facility];  Service: Urology;  Laterality: Bilateral; . CYSTOSCOPY W/ RETROGRADES Right 01/28/2018  Procedure: CYSTOSCOPY WITH RETROGRADE PYELOGRAM;  Surgeon: Hollice Espy, MD;  Location: [facility];  Service: Urology;  Laterality: Right; . CYSTOSCOPY W/ URETERAL STENT REMOVAL  08/2017 . CYSTOSCOPY WITH BIOPSY Right 01/28/2018  Procedure: CYSTOSCOPY WITH BIOPSY;  Surgeon: Hollice Espy, MD;  Location: [facility];  Service: Urology;  Laterality: Right; . CYSTOSCOPY WITH STENT PLACEMENT Right 09/03/2017  Procedure: CYSTOSCOPY WITH STENT PLACEMENT;  Surgeon: Hollice Espy, MD;  Location: [facility];  Service: Urology;  Laterality: Right; . CYSTOSCOPY WITH STENT PLACEMENT Right 01/28/2018  Procedure: CYSTOSCOPY WITH STENT PLACEMENT;  Surgeon: Hollice Espy, MD;  Location: [facility];  Service: Urology;  Laterality: Right; . CYSTOSCOPY WITH URETEROSCOPY Right 01/28/2018  Procedure: CYSTOSCOPY WITH URETEROSCOPY;  Surgeon: Hollice Espy, MD;  Location: [facility];  Service: Urology;  Laterality: Right; . ENDARTERECTOMY Right 07/17/2017  Procedure: ENDARTERECTOMY CAROTID;  Surgeon: Algernon Huxley, MD;  Location: [facility];  Service: Vascular;  Laterality: Right; . HERNIA REPAIR  2011  Ventral hernia . HOLMIUM LASER APPLICATION N/A 09/32/3557   Procedure: HOLMIUM LASER APPLICATION;  Surgeon: Hollice Espy, MD;  Location: [facility];  Service: Urology;  Laterality: N/A; . KNEE SURGERY Left   arthroscopy . KYPHOPLASTY N/A 08/14/2017  Procedure: [id number];  Surgeon: Hessie Knows, MD;  Location: [facility];  Service: Orthopedics;  Laterality: N/A; . LARYNX SURGERY   . LEFT HEART CATH AND CORS/GRAFTS ANGIOGRAPHY N/A 08/06/2019  Procedure: LEFT HEART CATH AND CORS/GRAFTS ANGIOGRAPHY;  Surgeon: Corey Skains, MD;  Location: [place] [facility];  Service: Cardiovascular;  Laterality: N/A; . POLYPECTOMY  10/26/2018  Procedure: POLYPECTOMY INTESTINAL;  Surgeon: Lucilla Lame, MD;  Location: [place];  Service: Endoscopy;;  Descending colon polyp Transverse colon polyps x 3 . PROSTATE SURGERY  2002  BPH benign pathology . SPINE SURGERY  1989  Lumbar disc . THYROPLASTY Right 01/05/2018  Procedure: THYROPLASTY;  Surgeon: Beverly Gust, MD;  Location: [facility];  Service: ENT;  Laterality: Right; HPI: Pt is a 78 year old male admitted to [facility] with NSTEMI s/p cardiac cath on 62/83, complicated by a small localized perforation of SVG during stent placement. He was subsequently transferred to [facility].  Pt developed progressive renal failure and associated respiratory failure. SLP was ordered as pt was noted to be coughing iwth meals. Pt had a R CEA in October 2018, after which he had hoarseness and trouble swallowing. [facility] admission later that month for PNA, MBS was completed that showed adequate oropharyngeal swallow with no aspiration, occasional trace penetration with thin liquids. ENT was recommended due to thickening around the epiglottis. Previous BSE in 2017 also [facility]. 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 PMH also includes: stage IV CKD, weight loss, thyroid disease, CVA, MI, CAD, HTN, HLD, DM, colon ca s/p resection  Subjective: pt syas he is having more trouble swallowing than usual Assessment /  Plan / Recommendation CHL IP CLINICAL IMPRESSIONS 08/13/2019 Clinical Impression Pt presents with silent aspiration with liquids regardless of consistency and bolus size that appears to be related to incomplet laryngeal vestibule closure. 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  SLP attempted various bolus delivery methods, bolus sizes, and positional strategies to try to increase airway protection. He had the most success when he used a head turn to the right, but he continue to have small amounts enter the airway unless he combined this with a spoonful of honey thick liquids. Pt also verbalized that he did not want to have solid foods, as subjectively this is what seemed hard at breakfast. Will start a Dys 1 (pureed) diet and honey thick liquids by spoon with a Right head turn. Pt will need to use a spoon-sized bolus and a right head turn or else he will still silently aspirate honey thick liquids. Sign was made for reinforcement and instructions were reviewed with RN. SLP will contineu to follow acutely.  SLP Visit Diagnosis Dysphagia, pharyngeal phase (R13.13) Attention and concentration deficit following -- Frontal lobe and executive function deficit following -- Impact on safety and function Severe aspiration risk;Moderate aspiration risk   CHL IP TREATMENT RECOMMENDATION 08/13/2019 Treatment Recommendations Therapy as outlined in treatment plan below   Prognosis 08/13/2019 Prognosis for Safe Diet Advancement Fair Barriers to Reach Goals Time post onset Barriers/Prognosis Comment -- CHL IP DIET RECOMMENDATION 08/13/2019 SLP Diet Recommendations Dysphagia 1 (Puree) solids;Honey thick liquids Liquid Administration via Spoon Medication Administration Crushed with puree Compensations Slow rate;Small sips/bites;Other (Comment) Postural Changes Seated upright at 90 degrees   CHL IP OTHER RECOMMENDATIONS 08/13/2019  Recommended Consults -- Oral Care Recommendations Oral care BID Other Recommendations Order thickener from pharmacy;Prohibited food (jello, ice cream, thin soups);Remove water pitcher   CHL IP FOLLOW UP RECOMMENDATIONS 08/13/2019 Follow up Recommendations (No Data)   CHL IP FREQUENCY AND DURATION 08/13/2019 Speech Therapy Frequency (ACUTE ONLY) min 2x/week Treatment Duration 2 weeks      CHL IP ORAL PHASE 08/13/2019 Oral Phase WFL Oral - Pudding Teaspoon -- Oral - Pudding Cup -- Oral - Honey Teaspoon -- Oral - Honey Cup -- Oral - Nectar Teaspoon -- Oral - Nectar Cup -- Oral - Nectar Straw -- Oral - Thin Teaspoon -- Oral - Thin Cup -- Oral - Thin Straw -- Oral - Puree -- Oral - Mech Soft -- Oral - Regular -- Oral - Multi-Consistency -- Oral - Pill -- Oral Phase - Comment --  CHL IP PHARYNGEAL PHASE 08/13/2019 Pharyngeal Phase Impaired Pharyngeal- Pudding Teaspoon -- Pharyngeal -- Pharyngeal- Pudding Cup -- Pharyngeal -- Pharyngeal- Honey Teaspoon Penetration/Aspiration during swallow Pharyngeal Material enters airway, passes BELOW cords without attempt by patient to eject out (silent aspiration) Pharyngeal- Honey Cup Penetration/Aspiration during swallow Pharyngeal Material enters airway, passes BELOW cords without attempt by patient to eject out (silent aspiration) Pharyngeal- Nectar Teaspoon -- Pharyngeal -- Pharyngeal- Nectar Cup Penetration/Aspiration during swallow Pharyngeal Material enters airway, passes BELOW cords without attempt by patient to eject out (silent aspiration) Pharyngeal- Nectar Straw Penetration/Aspiration during swallow Pharyngeal Material enters airway, passes BELOW cords without attempt by patient to eject out (silent aspiration) Pharyngeal- Thin Teaspoon -- Pharyngeal -- Pharyngeal- Thin Cup Penetration/Aspiration during swallow Pharyngeal Material enters airway, passes BELOW cords without attempt by patient to eject out (silent aspiration) Pharyngeal- Thin Straw Penetration/Aspiration  during swallow Pharyngeal Material enters airway, passes BELOW cords without attempt by patient to eject out (silent aspiration) Pharyngeal- Puree WFL Pharyngeal -- Pharyngeal- Mechanical Soft WFL Pharyngeal -- Pharyngeal- Regular -- Pharyngeal -- Pharyngeal- Multi-consistency -- Pharyngeal -- Pharyngeal- Pill -- Pharyngeal -- Pharyngeal Comment --  CHL IP CERVICAL ESOPHAGEAL PHASE 08/13/2019 Cervical Esophageal Phase WFL Pudding Teaspoon -- Pudding Cup -- Honey Teaspoon --  Honey Cup -- Nectar Teaspoon -- Nectar Cup -- Nectar Straw -- Thin Teaspoon -- Thin Cup -- Thin Straw -- Puree -- Mechanical Soft -- Regular -- Multi-consistency -- Pill -- Cervical Esophageal Comment -- Venita Sheffield Nix 08/13/2019, 2:17 PM  Pollyann Glen, M.A. [address] [facility] Pager [phone] Office [phone]               Medications:   . heparin 900 Units/hr (08/15/19 0940)   . aspirin EC  81 mg Oral Daily  . atorvastatin  80 mg Oral q1800  . Chlorhexidine Gluconate Cloth  6 each Topical Daily  . Chlorhexidine Gluconate Cloth  6 each Topical Q0600  . clopidogrel  75 mg Oral Daily  . coumadin book   Does not apply Once  . insulin aspart  0-15 Units Subcutaneous TID WC  . insulin aspart  0-5 Units Subcutaneous QHS  . insulin glargine  10 Units Subcutaneous BID  . levothyroxine  300 mcg Oral Q0600  . pantoprazole  40 mg Oral Daily  . sodium chloride flush  10-40 mL Intracatheter Q12H  . Warfarin - Pharmacist Dosing Inpatient   Does not apply q1800   acetaminophen, albuterol, food thickener, guaiFENesin, heparin, heparin, loperamide, nitroGLYCERIN, ondansetron (ZOFRAN) IV, Resource ThickenUp Clear, sodium chloride, sodium chloride flush  Assessment/ Plan:   Chronic kidney disease stage IV followed at [facility] last renal ultrasound revealed medical renal disease both kidneys no evidence of renal mass or hydronephrosis.  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  Repeat ultrasound ordered 08/10/2019.  Urine  microscopy revealed 21-50 WBCs per high-powered film 6-10 RBCs per high-powered film.  Greater than 300 mg proteinuria.  I would consider Foley trauma as a cause of his microscopic hematuria.  I doubt this represents acute interstitial nephritis or acute glomerulonephritis at this point.  Acute kidney injury most likely related related to hemodynamics contrast-induced nephropathy with marked worsening of renal function.  Possible thromboembolic disease.  Urgent dialysis required dialysis catheter placed 08/09/2019 with initiation of CRRT -continues to be oliguric  .  Stopped CRRT 08/12/2019.  Tolerated intermittent hemodialysis 08/12/2029.  He has a right nontunneled dialysis catheter.  This will need to be converted to a tunneled dialysis catheter with placement of AV fistula will consult V VS 08/16/2019  Acute congestive heart failure with most recent echocardiogram revealing normal ejection fraction followed by cardiology.  Transition to intermittent hemodialysis.  Atrial fibrillation now placed on heparin drip not on antihypertensives secondary to hypotension and bradycardia  NSTEMI status post PCI DES to SVG-RCA 14/48/1856 complicated by focal perforation of graft.  Appreciate assistance from cardiology  Hypotension   A-line seems to be monitoring blood pressures considerably higher than cuff.  Weaned off I Levophed  Lipidemia continue statin  Diabetes mellitus per primary team.   LOS: [place] @TODAY @10 :16 AM

## 2019-08-16 ENCOUNTER — Inpatient Hospital Stay (HOSPITAL_COMMUNITY): Payer: Medicare HMO

## 2019-08-16 DIAGNOSIS — I4819 Other persistent atrial fibrillation: Secondary | ICD-10-CM

## 2019-08-16 DIAGNOSIS — N185 Chronic kidney disease, stage 5: Secondary | ICD-10-CM

## 2019-08-16 DIAGNOSIS — I484 Atypical atrial flutter: Secondary | ICD-10-CM

## 2019-08-16 DIAGNOSIS — I4891 Unspecified atrial fibrillation: Secondary | ICD-10-CM

## 2019-08-16 LAB — PROTIME-INR
INR: 3.3 — ABNORMAL HIGH (ref 0.8–1.2)
Prothrombin Time: 33.1 seconds — ABNORMAL HIGH (ref 11.4–15.2)

## 2019-08-16 LAB — RENAL FUNCTION PANEL
Albumin: 2 g/dL — ABNORMAL LOW (ref 3.5–5.0)
Anion gap: 13 (ref 5–15)
BUN: 61 mg/dL — ABNORMAL HIGH (ref 8–23)
CO2: 20 mmol/L — ABNORMAL LOW (ref 22–32)
Calcium: 8.2 mg/dL — ABNORMAL LOW (ref 8.9–10.3)
Chloride: 102 mmol/L (ref 98–111)
Creatinine, Ser: 8.03 mg/dL — ABNORMAL HIGH (ref 0.61–1.24)
GFR calc Af Amer: 7 mL/min — ABNORMAL LOW (ref >60)
GFR calc non Af Amer: 6 mL/min — ABNORMAL LOW (ref >60)
Glucose, Bld: 200 mg/dL — ABNORMAL HIGH (ref 70–99)
Phosphorus: 4.4 mg/dL (ref 2.5–4.6)
Potassium: 4 mmol/L (ref 3.5–5.1)
Sodium: 135 mmol/L (ref 135–145)

## 2019-08-16 LAB — CBC
HCT: 26.9 % — ABNORMAL LOW (ref 39.0–52.0)
Hemoglobin: 8.7 g/dL — ABNORMAL LOW (ref 13.0–17.0)
MCH: 32.6 pg (ref 26.0–34.0)
MCHC: 32.3 g/dL (ref 30.0–36.0)
MCV: 100.7 fL — ABNORMAL HIGH (ref 80.0–100.0)
Platelets: 140 10*3/uL — ABNORMAL LOW (ref 150–400)
RBC: 2.67 MIL/uL — ABNORMAL LOW (ref 4.22–5.81)
RDW: 15.9 % — ABNORMAL HIGH (ref 11.5–15.5)
WBC: 12.2 10*3/uL — ABNORMAL HIGH (ref 4.0–10.5)
nRBC: 0.2 % (ref 0.0–0.2)

## 2019-08-16 LAB — GLUCOSE, CAPILLARY
Glucose-Capillary: 194 mg/dL — ABNORMAL HIGH (ref 70–99)
Glucose-Capillary: 174 mg/dL — ABNORMAL HIGH (ref 70–99)
Glucose-Capillary: 188 mg/dL — ABNORMAL HIGH (ref 70–99)

## 2019-08-16 LAB — HEPARIN LEVEL (UNFRACTIONATED): Heparin Unfractionated: 0.33 IU/mL (ref 0.30–0.70)

## 2019-08-16 LAB — MAGNESIUM: Magnesium: 2.5 mg/dL — ABNORMAL HIGH (ref 1.7–2.4)

## 2019-08-16 NOTE — Progress Notes (Signed)
[facility] NEPHROLOGY PROGRESS NOTE  Assessment/ Plan: Pt is a 78 y.o. yo male with CAD status post CABG, DM, HTN, HLD, CKD stage IV presented with an NSTEMI status post cardiac cath complicated by perforated SVG to RCA graft.  #NSTEMI/CAD: Status post cardiac cath with perforation.  Cardiology is following.  Currently on aspirin Plavix.  No chest pain today.  #AKI on CKD stage IV/V possibly due to contrast nephropathy and hemodynamic change.  Patient initially required CRRT which was a stopped on 10/29/120.  Tolerating intermittent hemodialysis.  HD today.  I have consulted Dr. Oneida Alar from previous for [facility] and AV fistula creation.  Ordered vein mapping.  Continue MWF schedule.  Need arrangement for outpatient dialysis.  I will order kidney ultrasound.  #Hypotension: Blood pressure improved.  #New onset atrial fibrillation/flutter: Off beta-blocker.  Starting Coumadin.  # Anemia: I will check iron studies.  Monitor CBC.  # Secondary hyperparathyroidism: Phosphorus level acceptable.  Check PTH level  # HTN/volume: Monitor blood pressure.  Volume status acceptable.  Subjective: Seen and examined in dialysis unit.  Tolerating HD well.  Problem with a temporary dialysis catheter.  Vascular was consulted today.  Denied nausea vomiting chest pain shortness of breath. Objective Vital signs in last 24 hours: Vitals:   08/16/19 1230 08/16/19 1300 08/16/19 1330 08/16/19 1400  BP: (!) (P) 108/52 (!) (P) 118/54 (!) (P) 98/50 (!) (P) 110/51  Pulse: (P) 61 (P) 62 (P) 66 (P) 69  Resp:      Temp:      TempSrc:      SpO2:      Weight:      Height:       Weight change: -1.5 kg  Intake/Output Summary (Last 24 hours) at 08/16/2019 1428 Last data filed at 08/16/2019 0526 Gross per 24 hour  Intake -  Output 350 ml  Net -350 ml       Labs: Basic Metabolic Panel: Recent Labs  Lab 08/15/19 0410 08/15/19 1634 08/16/19 0710  NA 136 135 135  K 3.9 3.9 4.0  CL 102 101 102   CO2 20* 20* 20*  GLUCOSE 207* 242* 200*  BUN 54* 59* 61*  CREATININE 6.80* 7.54* 8.03*  CALCIUM 8.3* 8.3* 8.2*  PHOS 4.0 3.5 4.4   Liver Function Tests: Recent Labs  Lab 08/15/19 0410 08/15/19 1634 08/16/19 0710  ALBUMIN 2.1* 2.1* 2.0*   No results for input(s): LIPASE, AMYLASE in the last 168 hours. No results for input(s): AMMONIA in the last 168 hours. CBC: Recent Labs  Lab 08/10/19 1031  08/12/19 1505 08/13/19 0604 08/14/19 0244 08/15/19 0410 08/16/19 0711  WBC 20.7*  21.1*   < > 11.4* 11.2* 11.9* 12.8* 12.2*  NEUTROABS 19.3*  --   --   --   --   --   --   HGB 11.9*  11.9*   < > 11.2* 10.1* 10.1* 9.2* 8.7*  HCT 36.5*  35.8*   < > 34.4* 30.7* 30.5* 28.7* 26.9*  MCV 100.3*  98.6   < > 99.4 100.0 97.8 100.0 100.7*  PLT 179  174   < > 182 120* 109* 122* 140*   < > = values in this interval not displayed.   Cardiac Enzymes: No results for input(s): CKTOTAL, CKMB, CKMBINDEX, TROPONINI in the last 168 hours. CBG: Recent Labs  Lab 08/15/19 0635 08/15/19 1159 08/15/19 1552 08/15/19 2048 08/16/19 0607  GLUCAP 198* 364* 222* 195* 194*    Iron Studies: No results for  input(s): IRON, TIBC, TRANSFERRIN, FERRITIN in the last 72 hours. Studies/Results: No results found.  Medications: Infusions:   Scheduled Medications: . aspirin EC  81 mg Oral Daily  . atorvastatin  80 mg Oral q1800  . Chlorhexidine Gluconate Cloth  6 each Topical Daily  . Chlorhexidine Gluconate Cloth  6 each Topical Q0600  . Chlorhexidine Gluconate Cloth  6 each Topical Q0600  . clopidogrel  75 mg Oral Daily  . insulin aspart  0-15 Units Subcutaneous TID WC  . insulin aspart  0-5 Units Subcutaneous QHS  . insulin glargine  10 Units Subcutaneous BID  . levothyroxine  300 mcg Oral Q0600  . pantoprazole  40 mg Oral Daily  . sodium chloride flush  10-40 mL Intracatheter Q12H  . Warfarin - Pharmacist Dosing Inpatient   Does not apply q1800    have reviewed scheduled and prn  medications.  Physical Exam: General:NAD, comfortable Heart:RRR, s1s2 nl Lungs:clear b/l, no crackle Abdomen:soft, Non-tender, non-distended Extremities:No edema Dialysis Access: Right IJ temporary catheter, site clean.  Has poor flow  Callee Rohrig Tanna Furry 08/16/2019,2:28 PM  LOS: 10 days  Pager: [phone]

## 2019-08-16 NOTE — Consult Note (Addendum)
Hospital Consult    Reason for Consult:  Dialysis access Requesting Physician:  Carolin Sicks MRN #:  [id number]  History of Present Illness: This is a 78 y.o. male who was admitted 9 days ago with non ST segment elevated MI and underwent cardiac catheterization and had stent placement to SVG complicated by perforation at [facility].  He was transferred to [facility] for further care.  He has had progressive renal failure possibly due to contrast nephropathy and hemodynamic change.  He initially required CRRT, which was stopped on 10/29.  He is tolerating intermittent HD.  VVS is consulted for a [facility] and permanent access.    The pt is on Coumadin for new onset afib/flutter and INR today is 3.3.  He is a pt of [facility] Dr. Lucky Cowboy and has hx of right CEA 07/17/17.  He was seen by Dr. Lucky Cowboy in June 2020 and his right CEA was widely patent and the left has has a known occlusion.   Pt has hx of CAD with CABG in 1997, DM, HTN, HLD, CKD4.     The pt is on a statin for cholesterol management.  The pt is on a daily aspirin.   Other AC:  Plavix/Coumadin. The pt was on BB for hypertension PTA The pt is diabetic.   Tobacco hx:  remote  Past Medical History:  Diagnosis Date  . Blood transfusion without reported diagnosis    patient unaware of receiving blood unless it was during surgery and he was not told  . Cancer [facility]) 2001   Colon resection  . CAP (community acquired pneumonia) 08/11/2017  . Carotid stenosis, symptomatic, with infarction ([place]) 07/17/2017  . Chronic kidney disease 11/2017   stage IV CKD per nephrologist.  . Complication of anesthesia    raspy voice since carotid endarterectomy 07/17/17. paralysis of vocal chords  . Coronary artery disease   . Diabetes mellitus without complication ([place])   . Dysrhythmia    patient unaware of any irregular heart rhythms  . Hyperlipidemia   . Hypertension   . Hypothyroidism   . Kidney mass 2019  . Myocardial infarction ([place]) 1997  .  Peripheral vascular disease ([place])   . Pneumonia 2014, 2018   developed after surgery 2018  . Stroke ([place]) 07/2017   mild stroke and then had carotid endarterectomy  . Thyroid disease   . Wears dentures    full upper  . Weight loss 2019   patient has lost over 60 pounds since 07/2017.     Past Surgical History:  Procedure Laterality Date  . APPENDECTOMY  2011  . BACK SURGERY    . CARDIAC CATHETERIZATION  1997  . CAROTID ANGIOGRAPHY Right 06/13/2017   Procedure: Right subclavian and Carotid Angiography, possible intervention;  Surgeon: Algernon Huxley, MD;  Location: [place] [facility];  Service: Cardiovascular;  Laterality: Right;  . CATARACT EXTRACTION, BILATERAL    . CHOLECYSTECTOMY N/A 04/23/2016   had infection post surgery requiring him to debride daily  . 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 COLONOSCOPY WITH PROPOFOL N/A 10/26/2018   Procedure: COLONOSCOPY WITH PROPOFOL;  Surgeon: Lucilla Lame, MD;  Location: [place];  Service: Endoscopy;  Laterality: N/A;  Diabetic - insulin  . CORONARY ARTERY BYPASS GRAFT  1997   x 3  . CORONARY STENT INTERVENTION N/A 08/06/2019   Procedure: CORONARY STENT INTERVENTION;  Surgeon: Nelva Bush, MD;  Location: [place] [facility];  Service:  Cardiovascular;  Laterality: N/A;  . CYSTOSCOPY W/ RETROGRADES Bilateral 09/03/2017   Procedure: CYSTOSCOPY WITH RETROGRADE PYELOGRAM;  Surgeon: Hollice Espy, MD;  Location: [facility];  Service: Urology;  Laterality: Bilateral;  . CYSTOSCOPY W/ RETROGRADES Right 01/28/2018   Procedure: CYSTOSCOPY WITH RETROGRADE PYELOGRAM;  Surgeon: Hollice Espy, MD;  Location: [facility];  Service: Urology;  Laterality: Right;  . CYSTOSCOPY W/ URETERAL STENT REMOVAL  08/2017  . CYSTOSCOPY WITH BIOPSY Right 01/28/2018   Procedure: CYSTOSCOPY WITH BIOPSY;  Surgeon: Hollice Espy, MD;  Location: [facility];  Service: Urology;  Laterality: Right;  . CYSTOSCOPY WITH  STENT PLACEMENT Right 09/03/2017   Procedure: CYSTOSCOPY WITH STENT PLACEMENT;  Surgeon: Hollice Espy, MD;  Location: [facility];  Service: Urology;  Laterality: Right;  . CYSTOSCOPY WITH STENT PLACEMENT Right 01/28/2018   Procedure: CYSTOSCOPY WITH STENT PLACEMENT;  Surgeon: Hollice Espy, MD;  Location: [facility];  Service: Urology;  Laterality: Right;  . CYSTOSCOPY WITH URETEROSCOPY Right 01/28/2018   Procedure: CYSTOSCOPY WITH URETEROSCOPY;  Surgeon: Hollice Espy, MD;  Location: [facility];  Service: Urology;  Laterality: Right;  . ENDARTERECTOMY Right 07/17/2017   Procedure: ENDARTERECTOMY CAROTID;  Surgeon: Algernon Huxley, MD;  Location: [facility];  Service: Vascular;  Laterality: Right;  . HERNIA REPAIR  2011   Ventral hernia  . HOLMIUM LASER APPLICATION N/A 53/61/4431   Procedure: HOLMIUM LASER APPLICATION;  Surgeon: Hollice Espy, MD;  Location: [facility];  Service: Urology;  Laterality: N/A;  . KNEE SURGERY Left    arthroscopy  . KYPHOPLASTY N/A 08/14/2017   Procedure: [id number];  Surgeon: Hessie Knows, MD;  Location: [facility];  Service: Orthopedics;  Laterality: N/A;  . LARYNX SURGERY    . LEFT HEART CATH AND CORS/GRAFTS ANGIOGRAPHY N/A 08/06/2019   Procedure: LEFT HEART CATH AND CORS/GRAFTS ANGIOGRAPHY;  Surgeon: Corey Skains, MD;  Location: [place] [facility];  Service: Cardiovascular;  Laterality: N/A;  . POLYPECTOMY  10/26/2018   Procedure: POLYPECTOMY INTESTINAL;  Surgeon: Lucilla Lame, MD;  Location: [place];  Service: Endoscopy;;  Descending colon polyp Transverse colon polyps x 3  . PROSTATE SURGERY  2002   BPH benign pathology  . SPINE SURGERY  1989   Lumbar disc  . THYROPLASTY Right 01/05/2018   Procedure: THYROPLASTY;  Surgeon: Beverly Gust, MD;  Location: [facility];  Service: ENT;  Laterality: Right;    Allergies  Allergen Reactions  . Ace Inhibitors Other (See Comments)    Reaction:  Raises potassium   . Quinapril Rash and Other (See  Comments)    hyperkalemia    Prior to Admission medications   Medication Sig Start Date End Date Taking? Authorizing Provider  acetaminophen (TYLENOL) 500 MG tablet Take 1,000 mg every 6 (six) hours as needed by mouth for moderate pain or headache.    [provider]  atenolol (TENORMIN) 50 MG tablet TAKE 1 TABLET BY MOUTH ONCE DAILY Patient taking differently: Take 50 mg by mouth daily.  10/30/18   Glean Hess, MD  atorvastatin (LIPITOR) 40 MG tablet TAKE 1 TABLET BY MOUTH ONCE DAILY AT  6PM Patient taking differently: Take 40 mg by mouth daily at 6 PM.  12/03/18   Glean Hess, MD  Blood Glucose Monitoring Suppl (FIFTY50 GLUCOSE METER 2.0) w/Device KIT 1 Device by Other route 2 (two) times daily. 12/08/17   [provider]  calcitRIOL (ROCALTROL) 0.5 MCG capsule Take 0.5 mcg by mouth daily. 07/05/19   [provider]  clopidogrel (PLAVIX) 75  MG tablet Take 1 tablet by mouth once daily Patient taking differently: Take 75 mg by mouth daily.  05/19/19   Glean Hess, MD  EQ ASPIRIN ADULT LOW DOSE 81 MG EC tablet TAKE 1  BY MOUTH ONCE DAILY Patient taking differently: Take 81 mg by mouth daily.  11/04/18   Algernon Huxley, MD  glucose blood (RELION PRIME TEST) test strip 1 strip by Other route 2 (two) times daily. 07/09/17   [provider]  heparin 5000 UNIT/ML injection Inject 1 mL (5,000 Units total) into the skin every 8 (eight) hours. 08/07/19   Awilda Bill, NP  hydrALAZINE (APRESOLINE) 20 MG/ML injection Inject 0.5 mLs (10 mg total) into the vein every 20 (twenty) minutes as needed (high blood pressure). 08/06/19   Awilda Bill, NP  insulin NPH Human (HUMULIN N,NOVOLIN N) 100 UNIT/ML injection Inject 0.25 mLs (25 Units total) into the skin 2 (two) times daily. Patient taking differently: Inject 30 Units into the skin 2 (two) times daily.  08/15/17   Vaughan Basta, MD  insulin regular (NOVOLIN R,HUMULIN R) 100 units/mL injection  Inject 12 Units into the skin 2 (two) times daily.     [provider]  levothyroxine (SYNTHROID, LEVOTHROID) 300 MCG tablet Take 300 mcg by mouth daily before breakfast.     [provider]  nitroGLYCERIN 0.2 mg/mL infusion Inject 0-200 mcg/min into the vein continuous. 08/06/19   Awilda Bill, NP  promethazine (PHENERGAN) 25 MG/ML injection Inject 0.25 mLs (6.25 mg total) into the vein every 6 (six) hours as needed for nausea or vomiting. 08/06/19   Awilda Bill, NP  sodium bicarbonate 650 MG tablet Take 650 mg by mouth 2 (two) times daily. 06/19/18   [provider]  Sodium Bicarbonate-Dextrose (SODIUM BICARBONATE 150 MEQ IN DEXTROSE 5% 1000 ML) 150-5 MEQ/L-% Inject 1,000 mLs (150 mEq total) into the vein continuous. 08/06/19   Awilda Bill, NP  sodium chloride 0.9 % infusion Inject 250 mLs into the vein as needed (for IV line care(Saline / Heparin Lock)). 08/06/19   Awilda Bill, NP  sodium chloride flush (NS) 0.9 % SOLN Inject 3 mLs into the vein every 12 (twelve) hours. 08/06/19   Awilda Bill, NP  sodium chloride flush (NS) 0.9 % SOLN Inject 3 mLs into the vein as needed. 08/06/19   Awilda Bill, NP  ticagrelor (BRILINTA) 90 MG TABS tablet Take 1 tablet (90 mg total) by mouth 2 (two) times daily. 08/07/19   Awilda Bill, NP  valACYclovir (VALTREX) 1000 MG tablet Take 1 tablet (1,000 mg total) by mouth 3 (three) times daily. 10/16/18   Glean Hess, MD    Social History   Socioeconomic History  . Marital status: Married    Spouse name: Not on file  . Number of children: 2  . Years of education: some college  . Highest education level: 12th grade  Occupational History  . Occupation: retired  Scientific laboratory technician  . Financial resource strain: Not hard at all  . Food insecurity    Worry: Never true    Inability: Never true  . Transportation needs    Medical: No    Non-medical: No  Tobacco Use  . Smoking status: Former Smoker     Packs/day: 1.50    Years: 44.00    Pack years: 66.00    Types: Cigarettes    Quit date: 06/24/1996    Years since quitting: 23.1  . Smokeless tobacco:  Never Used  . Tobacco comment: smoking cessation materials not required  Substance and Sexual Activity  . Alcohol use: Yes    Alcohol/week: 0.0 standard drinks    Comment: rare  . Drug use: No  . Sexual activity: Not Currently  Lifestyle  . Physical activity    Days per week: 0 days    Minutes per session: 0 min  . Stress: Only a little  Relationships  . Social Herbalist on phone: Patient refused    Gets together: Patient refused    Attends religious service: Patient refused    Active member of club or organization: Patient refused    Attends meetings of clubs or organizations: Patient refused    Relationship status: Married  . Intimate partner violence    Fear of current or ex partner: No    Emotionally abused: No    Physically abused: No    Forced sexual activity: No  Other Topics Concern  . Not on file  Social History Narrative  . Not on file    Family History  Problem Relation Age of Onset  . Diabetes Mother   . Heart disease Mother   . Heart disease Father   . Cancer Father        lung    ROS: [x]  Positive   [ ]  Negative   [ ]  All sytems reviewed and are negative  Cardiac: [x]  chest pain/pressure [x]  SOB []  DOE  Vascular: [x]  carotid disease s/p right CEA and known left carotid occlusion  Pulmonary: [x]  Hx CAP  Neurologic: [x]  hx of CVA []  mini stroke  Hematologic: [x]  hx of cancer  Endocrine:   [x]  diabetes [x]  thyroid disease  GI []  vomiting blood []  blood in stool  GU: [x]  CKD/renal failure []  HD--[]  M/W/F or []  T/T/S  Psychiatric: []  anxiety []  depression  Musculoskeletal: []  arthritis []  joint pain  Integumentary: []  rashes []  ulcers  Constitutional: []  fever []  chills   Physical Examination  Vitals:   08/16/19 1330 08/16/19 1400  BP: (!) (P) 98/50 (!)  (P) 110/51  Pulse: (P) 66 (P) 69  Resp:    Temp:    SpO2:     Body mass index is 25.87 kg/m (pended).  General:  WDWN in NAD Gait: Not observed HENT: WNL, normocephalic Pulmonary: normal non-labored breathing, without Rales, rhonchi,  wheezing Cardiac: regular Abdomen:  soft, NT/ND, no masses Skin: with ecchymosis BUE Vascular Exam/Pulses: Bilateral radial are difficult to palpate Extremities: without ischemic changes, without Gangrene , without cellulitis; without open wounds;  Musculoskeletal: no muscle wasting or atrophy  Neurologic: A&O X 3;  No focal weakness or paresthesias are detected; speech is fluent/normal Psychiatric:  The pt has Normal affect.  CBC    Component Value Date/Time   WBC 12.2 (H) 08/16/2019 0711   RBC 2.67 (L) 08/16/2019 0711   HGB 8.7 (L) 08/16/2019 0711   HGB 16.6 12/24/2016 1541   HCT 26.9 (L) 08/16/2019 0711   HCT 51.1 (H) 12/24/2016 1541   PLT 140 (L) 08/16/2019 0711   PLT 171 12/24/2016 1541   MCV 100.7 (H) 08/16/2019 0711   MCV 97 12/24/2016 1541   MCV 90 09/01/2013 0951   MCH 32.6 08/16/2019 0711   MCHC 32.3 08/16/2019 0711   RDW 15.9 (H) 08/16/2019 0711   RDW 14.3 12/24/2016 1541   RDW 15.0 (H) 09/01/2013 0951   LYMPHSABS 0.8 08/10/2019 1031   LYMPHSABS 2.8 12/24/2016 1541   LYMPHSABS 2.1 09/01/2013 4097  MONOABS 0.6 08/10/2019 1031   MONOABS 0.6 09/01/2013 0951   EOSABS 0.0 08/10/2019 1031   EOSABS 0.1 12/24/2016 1541   EOSABS 0.1 09/01/2013 0951   BASOSABS 0.0 08/10/2019 1031   BASOSABS 0.1 12/24/2016 1541   BASOSABS 0.1 09/01/2013 0951    BMET    Component Value Date/Time   NA 135 08/16/2019 0710   NA 135 (A) 06/16/2018   NA 140 12/10/2012 0726   K 4.0 08/16/2019 0710   K 4.6 12/10/2012 0726   CL 102 08/16/2019 0710   CL 111 (H) 12/10/2012 0726   CO2 20 (L) 08/16/2019 0710   CO2 20 (L) 12/10/2012 0726   GLUCOSE 200 (H) 08/16/2019 0710   GLUCOSE 181 (H) 12/10/2012 0726   BUN 61 (H) 08/16/2019 0710   BUN 57 (A)  06/16/2018   BUN 33 (H) 09/01/2013 0951   CREATININE 8.03 (H) 08/16/2019 0710   CREATININE 2.27 (H) 09/01/2013 0951   CALCIUM 8.2 (L) 08/16/2019 0710   CALCIUM 8.1 (L) 12/10/2012 0726   GFRNONAA 6 (L) 08/16/2019 0710   GFRNONAA 28 (L) 09/01/2013 0951   GFRAA 7 (L) 08/16/2019 0710   GFRAA 32 (L) 09/01/2013 0951    COAGS: Lab Results  Component Value Date   INR 3.3 (H) 08/16/2019   INR 2.4 (H) 08/15/2019   INR 1.2 08/14/2019     Non-Invasive Vascular Imaging:   BUE vein mapping ordered.    ASSESSMENT/PLAN: This is a 78 y.o. male with AKI on CKD now in need of TDC and permanent dialysis access.   -pt is left hand dominant and is restricted -pt has been started on coumadin for new onset afib/flutter and INR today is 3.3.  This will need to be reversed for tunneled catheter placement and permanent access. -Dr. Oneida Alar to see pt later today to determine plan.   Coumadin on hold now-possible for [facility] and access Wednesday if INR is 1.7 or less.  Leontine Locket, PA-C [facility] [phone]  Agree with above.  Pt currently in ECHO.  Will see tomorrow A.M Will need INR < 1.7 prior to access  Ruta Hinds, MD [facility] [place] Office: [phone]

## 2019-08-16 NOTE — Progress Notes (Signed)
ANTICOAGULATION CONSULT NOTE - Follow Up Consult  Pharmacy Consult for Heparin and Warfarin Indication: atrial fibrillation  Allergies  Allergen Reactions  . Ace Inhibitors Other (See Comments)    Reaction:  Raises potassium   . Quinapril Rash and Other (See Comments)    hyperkalemia    Patient Measurements: Height: 6\' 2"  (188 cm) Weight: 200 lb 6.4 oz (90.9 kg) IBW/kg (Calculated) : 82.2 Heparin Dosing Weight: 90.9 kg  Vital Signs: Temp Source: Oral (11/02 0524) BP: 134/48 (11/02 0524) Pulse Rate: 60 (11/02 0524)  Labs: Recent Labs    08/14/19 0244  08/15/19 0410 08/15/19 1055 08/15/19 1634 08/16/19 0710 08/16/19 0711  HGB 10.1*  --  9.2*  --   --   --  8.7*  HCT 30.5*  --  28.7*  --   --   --  26.9*  PLT 109*  --  122*  --   --   --  140*  LABPROT 15.0  --  25.8*  --   --   --  33.1*  INR 1.2  --  2.4*  --   --   --  3.3*  HEPARINUNFRC  --    < > 0.40 0.52  --   --  0.33  CREATININE 4.70*   < > 6.80*  --  7.54* 8.03*  --    < > = values in this interval not displayed.    Assessment:  40 YOM presented with NSTEMI, s/p PCI with DES to SVG-RCA on 75/91, complicated by focal perforation of the graft. Pt on DAPT w/ ASA and Plavix, developed Afib and IV heparin and warfarin begun 10/29. No anticoagulation prior to admission.     Heparin level is therapeutic (0.33) on 850 units/hr.   INR up to 2.4 on 11/1 after Warfarin 5 mg x 2 days > dose held on 11/1 and INR up to 3.3 today.       S/p CRRT 10/27 >> 10/29, now in intermittent HD. For HD today via TDC, needs AV fistula placement.  Goal of Therapy:  INR 2-3 Heparin level 0.3-0.7 units/ml Monitor platelets by anticoagulation protocol: Yes   Plan:   Discontinue IV heparin with INR 3.3.  D/w C. Kathlen Mody, PA-C  Hold Warfarin today..  Daily PT/INR.  Follow up for any need to resume IV heparin if INR drops <2 and warfarin needs to be held for procedure.  Arty Baumgartner, [place] Pager: [phone] or phone:  [phone] 08/16/2019,11:57 AM

## 2019-08-16 NOTE — Progress Notes (Addendum)
Progress Note  Patient Name: [name] Date of Encounter: 08/16/2019  Primary Cardiologist: Corey Skains, MD   Subjective   [name] reports that he is doing well today, denies any new complaints. He denies any chest pain, shortness of breath or headaches.  He had constipation but reports that he had an enema yesterday which helped. He also reports some generalized weakness. He does report some pain when he is getting HD.   Inpatient Medications    Scheduled Meds: . aspirin EC  81 mg Oral Daily  . atorvastatin  80 mg Oral q1800  . Chlorhexidine Gluconate Cloth  6 each Topical Daily  . Chlorhexidine Gluconate Cloth  6 each Topical Q0600  . Chlorhexidine Gluconate Cloth  6 each Topical Q0600  . clopidogrel  75 mg Oral Daily  . coumadin book   Does not apply Once  . insulin aspart  0-15 Units Subcutaneous TID WC  . insulin aspart  0-5 Units Subcutaneous QHS  . insulin glargine  10 Units Subcutaneous BID  . levothyroxine  300 mcg Oral Q0600  . pantoprazole  40 mg Oral Daily  . sodium chloride flush  10-40 mL Intracatheter Q12H  . Warfarin - Pharmacist Dosing Inpatient   Does not apply q1800   Continuous Infusions: . heparin 850 Units/hr (08/15/19 1227)   PRN Meds: acetaminophen, albuterol, food thickener, guaiFENesin, heparin, heparin, loperamide, nitroGLYCERIN, ondansetron (ZOFRAN) IV, Resource ThickenUp Clear, sodium chloride, sodium chloride flush   Vital Signs    Vitals:   08/15/19 1553 08/15/19 2043 08/15/19 2348 08/16/19 0524  BP: (!) 144/52 (!) 169/60 (!) 158/56 (!) 134/48  Pulse: (!) 59 60 62 60  Resp: 20 18 19 17   Temp: 97.9 F (36.6 C) 97.9 F (36.6 C) (!) 97.5 F (36.4 C)   TempSrc: Oral Oral Oral Oral  SpO2: 94% 93% 95% 94%  Weight:    90.9 kg  Height:        Intake/Output Summary (Last 24 hours) at 08/16/2019 0908 Last data filed at 08/16/2019 0526 Gross per 24 hour  Intake 160 ml  Output 350 ml  Net -190 ml   Filed Weights   08/13/19  2147 08/15/19 0409 08/16/19 0524  Weight: 91.8 kg 92.4 kg 90.9 kg    Telemetry    Atrial fibrillation/flutter with PVCs - Personally Reviewed  ECG    None today  Physical Exam   GEN: No acute distress.   Neck: No JVD, right IJ in place Cardiac: RRR, no murmurs, rubs, or gallops. Sternotomy scar.  Respiratory: Clear to auscultation bilaterally. GI: Soft, nontender, non-distended  MS: No edema; No deformity. Neuro:  Nonfocal  Psych: Normal affect   Labs    Chemistry Recent Labs  Lab 08/15/19 0410 08/15/19 1634 08/16/19 0710  NA 136 135 135  K 3.9 3.9 4.0  CL 102 101 102  CO2 20* 20* 20*  GLUCOSE 207* 242* 200*  BUN 54* 59* 61*  CREATININE 6.80* 7.54* 8.03*  CALCIUM 8.3* 8.3* 8.2*  ALBUMIN 2.1* 2.1* 2.0*  GFRNONAA 7* 6* 6*  GFRAA 8* 7* 7*  ANIONGAP 14 14 13      Hematology Recent Labs  Lab 08/14/19 0244 08/15/19 0410 08/16/19 0711  WBC 11.9* 12.8* 12.2*  RBC 3.12* 2.87* 2.67*  HGB 10.1* 9.2* 8.7*  HCT 30.5* 28.7* 26.9*  MCV 97.8 100.0 100.7*  MCH 32.4 32.1 32.6  MCHC 33.1 32.1 32.3  RDW 15.7* 15.8* 15.9*  PLT 109* 122* 140*    Cardiac  EnzymesNo results for input(s): TROPONINI in the last 168 hours. No results for input(s): TROPIPOC in the last 168 hours.   BNP Recent Labs  Lab 08/12/19 1505  BNP 353.8*     DDimer No results for input(s): DDIMER in the last 168 hours.   Radiology    No results found.  Cardiac Studies   Cardiac Catheterization and Percutaneous Coronary Intervention10.23.2020  Conclusions: 1. See diagnostic angiogram by Dr. Nehemiah Massed for details of coronary/graft anatomy. 2. Severe disease involving SVG-RCA with 80-90% stenosis. 3. Successful PCI to SVG-RCA using Resolute Onyx 4.0 x 38 mm drug-eluting stent with 10% residual stenosis and TIMI-3 flow. 4. Focal perforation of the SVG-RCA during stent deployment. This was successfully contained with internal tamponade.  Recommendations: 1. Dual antiplatelet therapy with  aspirin and ticagrelor for at least 12 months. 2. Aggressive secondary prevention. 3. Transfer to [facility] for ICU monitoring, given focal perforation of SVG-RCA. If the patient were to become hemodynamically unstable or develop worsening anemia, repeat angiography of SVG-RCA and possible covered stent placement would need to be considered. _____________  2D Echocardiogram10.24.2020 IMPRESSIONS  1. Left ventricular ejection fraction, by visual estimation, is 60 to 65%. The left ventricle has normal function. Normal left ventricular size. There is no left ventricular hypertrophy. 2. Global right ventricle has normal systolic function.The right ventricular size is normal. No increase in right ventricular wall thickness. 3. Left atrial size was normal. 4. Right atrial size was norma 5. Severe mitral annular calcification. Mild calcification of the anterior mitral valve leaflet(s).. No evidence of mitral valve regurgitation. Mild mitral stenosis. 6. The tricuspid valve is normal in structure. Tricuspid valve regurgitation is mild. 7. The aortic valve is tricuspid Aortic valve regurgitation is mild by color flow Doppler. Moderate aortic valve sclerosis/calcification without any evidence of aortic stenosis. 8. The pulmonic valve was normal in structure. Pulmonic valve regurgitation is not visualized by color flow Doppler. 9. Normal pulmonary artery systolic pressure. 10. The inferior vena cava is normal in size with greater than 50% respiratory variability, suggesting right atrial pressure of 3 mmHg. 11. No evidence of pericardial effusion. _____________   Patient Profile     78 y.o. male with a history of CAD s/p CABG, DM, HTN, HLD, CKD stage 4 who presented with an NSTEMI s/p cardiac catheterizaion complicated by perforated SVG to RCA graft who was transferred from [facility] also with CKD stage 5 requiring iHD.   Assessment & Plan   NSTEMI/CAD: Perforation SVG to  RCA, contained with internal tamponade.Denies any chest pain or SOB today. Currently on triple therapy because of atrial fibrillation and recent stent deployment. Denies any shortness of breath or chest pain today. Continue ASA, warfarin, and plavix. Continue ASA for 30 days post stent, 09/06/19, the decrease to plavix 75 mg daily and coumadin only.    CKD stage 5 with intermittent HD: Currently has RIJ in place. Nephrology following. Plan for dialysis today and the [facility] placement on 11/3.   New onset atrial fibrillation/flutter: CHADs-VASc score of 6, unclear etiology at this time. HR down to the 58. Currently in atrial flutter on telemetry. Not on any AV nodal blocking agents. Currently on triple therapy. Epistaxis has stopped.   HTN: BP stable today, continue to monitor.   For questions or updates, please contact [place] Please consult [email or web address] for contact info under Cardiology/STEMI.    Signed, Asencion Noble, MD   08/16/2019, 9:08 AM    I have seen and examined the patient along  with Asencion Noble, MD  .  I have reviewed the chart, notes and new data.  I agree with PA/NP's note.  Key new complaints: Feeling stronger.  Has been able to walk short distances with physical therapy.  Denies chest pain.  No dyspnea with light activity. Key examination changes: Appears pale, generally weak and frail.  Rhythm is regular.  Key new findings / data: on monitor appears to have atrial flutter with controlled ventricular rate.  At other times has clear atrial fibrillation. INR 3.3.  PLAN: Continue physical therapy, ask inpatient rehab for evaluation. Needs a semipermanent dialysis access (but he is now fully anticoagulated with INR 3.3 which may delay this procedure). On "triple anticoagulant/antiplatelet therapy".  Continue aspirin for 30 days then warfarin plus clopidogrel for 12 months. The arrhythmia appears to be asymptomatic and there is no plan for cardioversion at this  time.  High embolic risk.  Consider cardioversion when he has had 30 days of uninterrupted anticoagulation and when there is no plan for interruption in anticoagulation for medical procedures for at least 4 weeks following defibrillation.  Sanda Klein, MD, [place] [phone] 08/16/2019, 11:58 AM

## 2019-08-17 ENCOUNTER — Inpatient Hospital Stay (HOSPITAL_COMMUNITY): Payer: Medicare HMO

## 2019-08-17 DIAGNOSIS — N186 End stage renal disease: Secondary | ICD-10-CM

## 2019-08-17 LAB — CBC
HCT: 27.2 % — ABNORMAL LOW (ref 39.0–52.0)
Hemoglobin: 8.9 g/dL — ABNORMAL LOW (ref 13.0–17.0)
MCH: 32.8 pg (ref 26.0–34.0)
MCHC: 32.7 g/dL (ref 30.0–36.0)
MCV: 100.4 fL — ABNORMAL HIGH (ref 80.0–100.0)
Platelets: 146 10*3/uL — ABNORMAL LOW (ref 150–400)
RBC: 2.71 MIL/uL — ABNORMAL LOW (ref 4.22–5.81)
RDW: 15.9 % — ABNORMAL HIGH (ref 11.5–15.5)
WBC: 12.9 10*3/uL — ABNORMAL HIGH (ref 4.0–10.5)
nRBC: 0.2 % (ref 0.0–0.2)

## 2019-08-17 LAB — RENAL FUNCTION PANEL
Albumin: 2.1 g/dL — ABNORMAL LOW (ref 3.5–5.0)
Anion gap: 11 (ref 5–15)
BUN: 31 mg/dL — ABNORMAL HIGH (ref 8–23)
CO2: 24 mmol/L (ref 22–32)
Calcium: 8.1 mg/dL — ABNORMAL LOW (ref 8.9–10.3)
Chloride: 99 mmol/L (ref 98–111)
Creatinine, Ser: 5.66 mg/dL — ABNORMAL HIGH (ref 0.61–1.24)
GFR calc Af Amer: 10 mL/min — ABNORMAL LOW (ref >60)
GFR calc non Af Amer: 9 mL/min — ABNORMAL LOW (ref >60)
Glucose, Bld: 187 mg/dL — ABNORMAL HIGH (ref 70–99)
Phosphorus: 4.3 mg/dL (ref 2.5–4.6)
Potassium: 3.7 mmol/L (ref 3.5–5.1)
Sodium: 134 mmol/L — ABNORMAL LOW (ref 135–145)

## 2019-08-17 LAB — IRON AND TIBC
Iron: 54 ug/dL (ref 45–182)
Saturation Ratios: 20 % (ref 17.9–39.5)
TIBC: 267 ug/dL (ref 250–450)
UIBC: 213 ug/dL

## 2019-08-17 LAB — GLUCOSE, CAPILLARY
Glucose-Capillary: 160 mg/dL — ABNORMAL HIGH (ref 70–99)
Glucose-Capillary: 223 mg/dL — ABNORMAL HIGH (ref 70–99)
Glucose-Capillary: 276 mg/dL — ABNORMAL HIGH (ref 70–99)
Glucose-Capillary: 202 mg/dL — ABNORMAL HIGH (ref 70–99)

## 2019-08-17 LAB — PROTIME-INR
INR: 2.5 — ABNORMAL HIGH (ref 0.8–1.2)
Prothrombin Time: 26.3 seconds — ABNORMAL HIGH (ref 11.4–15.2)

## 2019-08-17 LAB — MAGNESIUM: Magnesium: 2.2 mg/dL (ref 1.7–2.4)

## 2019-08-17 LAB — FERRITIN: Ferritin: 116 ng/mL (ref 24–336)

## 2019-08-17 MED ORDER — DARBEPOETIN ALFA 100 MCG/0.5ML IJ SOSY
100.0000 ug | PREFILLED_SYRINGE | INTRAMUSCULAR | Status: DC
  Administered 2019-08-18 – 2019-08-25 (×2): 100 ug via INTRAVENOUS
  Filled 2019-08-17 (×2): qty 0.5

## 2019-08-17 MED ORDER — CHLORHEXIDINE GLUCONATE CLOTH 2 % EX PADS
6.0000 | MEDICATED_PAD | Freq: Every day | CUTANEOUS | Status: DC
  Administered 2019-08-18 – 2019-08-22 (×4): 6 via TOPICAL

## 2019-08-17 MED ORDER — RENA-VITE PO TABS
1.0000 | ORAL_TABLET | Freq: Every day | ORAL | Status: DC
  Administered 2019-08-17 – 2019-08-25 (×7): 1 via ORAL
  Filled 2019-08-17 (×7): qty 1

## 2019-08-17 MED ORDER — INSULIN GLARGINE 100 UNIT/ML ~~LOC~~ SOLN
15.0000 [IU] | Freq: Two times a day (BID) | SUBCUTANEOUS | Status: DC
  Administered 2019-08-17 – 2019-08-25 (×13): 15 [IU] via SUBCUTANEOUS
  Filled 2019-08-17 (×21): qty 0.15

## 2019-08-17 MED ORDER — NEPRO/CARBSTEADY PO LIQD
237.0000 mL | Freq: Three times a day (TID) | ORAL | Status: DC
  Administered 2019-08-17 – 2019-08-24 (×8): 237 mL via ORAL

## 2019-08-17 MED ORDER — SODIUM CHLORIDE 0.9 % IV SOLN
125.0000 mg | INTRAVENOUS | Status: DC
  Administered 2019-08-18 – 2019-08-25 (×3): 125 mg via INTRAVENOUS
  Filled 2019-08-17 (×8): qty 10

## 2019-08-17 MED ORDER — MIDODRINE HCL 2.5 MG PO TABS
2.5000 mg | ORAL_TABLET | Freq: Three times a day (TID) | ORAL | Status: DC
  Administered 2019-08-17 – 2019-08-19 (×6): 2.5 mg via ORAL
  Filled 2019-08-17 (×8): qty 1

## 2019-08-17 NOTE — Progress Notes (Signed)
[facility] NEPHROLOGY PROGRESS NOTE  Assessment/ Plan: Pt is a 78 y.o. yo male with CAD status post CABG, DM, HTN, HLD, CKD stage IV presented with an NSTEMI status post cardiac cath complicated by perforated SVG to RCA graft.  #NSTEMI/CAD: Status post cardiac cath with perforation.  Cardiology is following.  Currently on aspirin Plavix.  No chest pain today.  #AKI on CKD stage IV/V possibly due to contrast nephropathy and hemodynamic change.  Kidney ultrasound with chronic finding, no obstruction.  Patient initially required CRRT which was a stopped on 10/29/120.  Tolerating intermittent hemodialysis.  -Status post HD yesterday with 1.5 L UF, tolerated well. -Vascular surgery was consulted for [facility] and AV fistula creation.  INR elevated therefore holding Coumadin.  Monitor lab tomorrow morning, plan for procedure if INR less than 1.7 per surgeon.  Vein mapping is scheduled.  Continue MWF schedule for dialysis. -Need arrangement for outpatient dialysis.    #Hypotension: Blood pressure improved.  #New onset atrial fibrillation/flutter: Off beta-blocker.  Started Coumadin which is on hold for procedure.  # Anemia: Iron saturation 20%.  Start IV iron and ESA during dialysis. Monitor CBC.  # Secondary hyperparathyroidism: Phosphorus level acceptable.  Follow-up PTH level  # HTN/volume: Monitor blood pressure.  Volume status acceptable.  Discussed with the patient and his wife today.  Subjective: Seen and examined at bedside.  Coumadin on hold.  Tolerated dialysis well yesterday.  Denied nausea vomiting chest pain shortness of breath. His wife at bedside.  Objective Vital signs in last 24 hours: Vitals:   08/17/19 0814 08/17/19 0838 08/17/19 0840 08/17/19 1045  BP: (!) 114/49 (!) 108/44 (!) 86/48 (!) 127/45  Pulse: (!) 55  80 (!) 58  Resp: 18 17 (!) 26 18  Temp: 98.2 F (36.8 C)     TempSrc: Oral     SpO2: 95%  93% 94%  Weight:      Height:       Weight change: 0.5  kg  Intake/Output Summary (Last 24 hours) at 08/17/2019 1129 Last data filed at 08/17/2019 0358 Gross per 24 hour  Intake 10 ml  Output 1700 ml  Net -1690 ml       Labs: Basic Metabolic Panel: Recent Labs  Lab 08/15/19 1634 08/16/19 0710 08/17/19 0324  NA 135 135 134*  K 3.9 4.0 3.7  CL 101 102 99  CO2 20* 20* 24  GLUCOSE 242* 200* 187*  BUN 59* 61* 31*  CREATININE 7.54* 8.03* 5.66*  CALCIUM 8.3* 8.2* 8.1*  PHOS 3.5 4.4 4.3   Liver Function Tests: Recent Labs  Lab 08/15/19 1634 08/16/19 0710 08/17/19 0324  ALBUMIN 2.1* 2.0* 2.1*   No results for input(s): LIPASE, AMYLASE in the last 168 hours. No results for input(s): AMMONIA in the last 168 hours. CBC: Recent Labs  Lab 08/13/19 0604 08/14/19 0244 08/15/19 0410 08/16/19 0711 08/17/19 0324  WBC 11.2* 11.9* 12.8* 12.2* 12.9*  HGB 10.1* 10.1* 9.2* 8.7* 8.9*  HCT 30.7* 30.5* 28.7* 26.9* 27.2*  MCV 100.0 97.8 100.0 100.7* 100.4*  PLT 120* 109* 122* 140* 146*   Cardiac Enzymes: No results for input(s): CKTOTAL, CKMB, CKMBINDEX, TROPONINI in the last 168 hours. CBG: Recent Labs  Lab 08/15/19 2048 08/16/19 0607 08/16/19 1752 08/16/19 2118 08/17/19 0604  GLUCAP 195* 194* 174* 188* 160*    Iron Studies:  Recent Labs    08/17/19 0324  IRON 54  TIBC 267  FERRITIN 116   Studies/Results: US Renal  Result Date: 08/16/2019  CLINICAL DATA:  Acute kidney injury EXAM: RENAL / URINARY TRACT ULTRASOUND COMPLETE COMPARISON:  11/05/2018 FINDINGS: Right Kidney: Renal measurements: 9.7 x 4.5 x 3.3 cm = volume: 76 mL. Increased renal cortical echogenicity. No mass or hydronephrosis visualized. Left Kidney: Renal measurements: 8.9 x 3.7 x 4.0 cm = volume: 68 mL. Increased renal cortical echogenicity. No mass or hydronephrosis visualized. Bladder: Decompressed by Foley catheter. Other: None. IMPRESSION: 1. Negative for obstructive uropathy. 2. Findings of chronic medical renal disease. Electronically Signed   By:  Davina Poke M.D.   On: 08/16/2019 17:19    Medications: Infusions:   Scheduled Medications: . aspirin EC  81 mg Oral Daily  . atorvastatin  80 mg Oral q1800  . Chlorhexidine Gluconate Cloth  6 each Topical Daily  . clopidogrel  75 mg Oral Daily  . insulin aspart  0-15 Units Subcutaneous TID WC  . insulin aspart  0-5 Units Subcutaneous QHS  . insulin glargine  10 Units Subcutaneous BID  . levothyroxine  300 mcg Oral Q0600  . midodrine  2.5 mg Oral TID WC  . pantoprazole  40 mg Oral Daily  . sodium chloride flush  10-40 mL Intracatheter Q12H  . Warfarin - Pharmacist Dosing Inpatient   Does not apply q1800    have reviewed scheduled and prn medications.  Physical Exam: General:NAD, comfortable Heart:RRR, s1s2 nl, no rubs Lungs: Clear b/l, no crackle Abdomen:soft, Non-tender, non-distended Extremities:No edema Dialysis Access: Right IJ temporary catheter, site clean.    Dron Prasad Bhandari 08/17/2019,11:29 AM  LOS: 11 days  Pager: [phone]

## 2019-08-17 NOTE — Progress Notes (Signed)
Progress Note  Patient Name: [name] Date of Encounter: 08/17/2019  Primary Cardiologist: Corey Skains, MD   Subjective   O/N events: Holding coumadin in preparation for [facility] placement. Currently INR is 2.5, goal is <1.7.   Mr. Yono was seen at bedside today. He reports that he started having some shortness of breath when he woke up this morning. He denied any chest pain, headaches, palpitations, or other symptoms.    Inpatient Medications    Scheduled Meds: . aspirin EC  81 mg Oral Daily  . atorvastatin  80 mg Oral q1800  . Chlorhexidine Gluconate Cloth  6 each Topical Daily  . clopidogrel  75 mg Oral Daily  . insulin aspart  0-15 Units Subcutaneous TID WC  . insulin aspart  0-5 Units Subcutaneous QHS  . insulin glargine  10 Units Subcutaneous BID  . levothyroxine  300 mcg Oral Q0600  . pantoprazole  40 mg Oral Daily  . sodium chloride flush  10-40 mL Intracatheter Q12H  . Warfarin - Pharmacist Dosing Inpatient   Does not apply q1800   Continuous Infusions:  PRN Meds: acetaminophen, albuterol, food thickener, guaiFENesin, heparin, loperamide, nitroGLYCERIN, ondansetron (ZOFRAN) IV, Resource ThickenUp Clear, sodium chloride, sodium chloride flush   Vital Signs    Vitals:   08/16/19 1623 08/16/19 2000 08/16/19 2358 08/17/19 0356  BP: (!) 139/52 (!) 112/45 (!) 119/47 125/64  Pulse: 62 63 64 61  Resp: 19 18 19 20   Temp: 97.7 F (36.5 C) 97.8 F (36.6 C) 98.1 F (36.7 C) 97.9 F (36.6 C)  TempSrc: Oral Oral Oral Oral  SpO2: 96% 92% 92% 94%  Weight: 90 kg   90.7 kg  Height:    6\' 2"  (1.88 m)    Intake/Output Summary (Last 24 hours) at 08/17/2019 0635 Last data filed at 08/17/2019 0358 Gross per 24 hour  Intake 10 ml  Output 1700 ml  Net -1690 ml   Filed Weights   08/16/19 1156 08/16/19 1623 08/17/19 0356  Weight: 91.4 kg 90 kg 90.7 kg    Telemetry    Atrial fibrillation/flutter, occasional PVCs- Personally Reviewed  ECG    None today   Physical Exam   GEN: No acute distress.   Neck: Right IJ in place Cardiac: RRR, no murmurs, rubs, or gallops. Sternotomy scar.  Respiratory: Bibasilar crackles, normal work of breathing GI: Soft, nontender, non-distended  MS: No edema; No deformity. Neuro:  Nonfocal  Psych: Normal affect   Labs    Chemistry Recent Labs  Lab 08/15/19 1634 08/16/19 0710 08/17/19 0324  NA 135 135 134*  K 3.9 4.0 3.7  CL 101 102 99  CO2 20* 20* 24  GLUCOSE 242* 200* 187*  BUN 59* 61* 31*  CREATININE 7.54* 8.03* 5.66*  CALCIUM 8.3* 8.2* 8.1*  ALBUMIN 2.1* 2.0* 2.1*  GFRNONAA 6* 6* 9*  GFRAA 7* 7* 10*  ANIONGAP 14 13 11      Hematology Recent Labs  Lab 08/15/19 0410 08/16/19 0711 08/17/19 0324  WBC 12.8* 12.2* 12.9*  RBC 2.87* 2.67* 2.71*  HGB 9.2* 8.7* 8.9*  HCT 28.7* 26.9* 27.2*  MCV 100.0 100.7* 100.4*  MCH 32.1 32.6 32.8  MCHC 32.1 32.3 32.7  RDW 15.8* 15.9* 15.9*  PLT 122* 140* 146*    Cardiac EnzymesNo results for input(s): TROPONINI in the last 168 hours. No results for input(s): TROPIPOC in the last 168 hours.   BNP Recent Labs  Lab 08/12/19 1505  BNP 353.8*  DDimer No results for input(s): DDIMER in the last 168 hours.   Radiology    US Renal  Result Date: 08/16/2019 CLINICAL DATA:  Acute kidney injury EXAM: RENAL / URINARY TRACT ULTRASOUND COMPLETE COMPARISON:  11/05/2018 FINDINGS: Right Kidney: Renal measurements: 9.7 x 4.5 x 3.3 cm = volume: 76 mL. Increased renal cortical echogenicity. No mass or hydronephrosis visualized. Left Kidney: Renal measurements: 8.9 x 3.7 x 4.0 cm = volume: 68 mL. Increased renal cortical echogenicity. No mass or hydronephrosis visualized. Bladder: Decompressed by Foley catheter. Other: None. IMPRESSION: 1. Negative for obstructive uropathy. 2. Findings of chronic medical renal disease. Electronically Signed   By: Davina Poke M.D.   On: 08/16/2019 17:19    Cardiac Studies   Cardiac Catheterization and Percutaneous Coronary  Intervention10.23.2020  Conclusions: 1. See diagnostic angiogram by Dr. Nehemiah Massed for details of coronary/graft anatomy. 2. Severe disease involving SVG-RCA with 80-90% stenosis. 3. Successful PCI to SVG-RCA using Resolute Onyx 4.0 x 38 mm drug-eluting stent with 10% residual stenosis and TIMI-3 flow. 4. Focal perforation of the SVG-RCA during stent deployment. This was successfully contained with internal tamponade.  Recommendations: 1. Dual antiplatelet therapy with aspirin and ticagrelor for at least 12 months. 2. Aggressive secondary prevention. 3. Transfer to [facility] for ICU monitoring, given focal perforation of SVG-RCA. If the patient were to become hemodynamically unstable or develop worsening anemia, repeat angiography of SVG-RCA and possible covered stent placement would need to be considered. _____________  2D Echocardiogram10.24.2020 IMPRESSIONS  1. Left ventricular ejection fraction, by visual estimation, is 60 to 65%. The left ventricle has normal function. Normal left ventricular size. There is no left ventricular hypertrophy. 2. Global right ventricle has normal systolic function.The right ventricular size is normal. No increase in right ventricular wall thickness. 3. Left atrial size was normal. 4. Right atrial size was norma 5. Severe mitral annular calcification. Mild calcification of the anterior mitral valve leaflet(s).. No evidence of mitral valve regurgitation. Mild mitral stenosis. 6. The tricuspid valve is normal in structure. Tricuspid valve regurgitation is mild. 7. The aortic valve is tricuspid Aortic valve regurgitation is mild by color flow Doppler. Moderate aortic valve sclerosis/calcification without any evidence of aortic stenosis. 8. The pulmonic valve was normal in structure. Pulmonic valve regurgitation is not visualized by color flow Doppler. 9. Normal pulmonary artery systolic pressure. 10. The inferior vena cava is normal  in size with greater than 50% respiratory variability, suggesting right atrial pressure of 3 mmHg. 11. No evidence of pericardial effusion. _____________   Patient Profile     78 y.o. male with a history of CAD s/p CABG, DM, HTN, HLD, CKD stage 4 who presented with an NSTEMI s/p cardiac catheterizaion complicated by perforated SVG to RCA graft who was transferred from [facility] also with CKD stage 5 requiring iHD.   Assessment & Plan   NSTEMI/CAD: Perforation SVG to RCA, contained with internal tamponade. Reports having some SOB today, denies any chest pain or palpitations. Currently on triple therapy because of atrial fibrillation and recent stent deployment. Denies any shortness of breath or chest pain today. Continue ASA, and plavix. Continue ASA for 30 days post stent, 09/06/19, the decrease to plavix 75 mg daily and coumadin only for at least 1 year.  -Continue ASA and plavix -Hold coumadin for [facility]  CKD stage 5 with intermittent HD: Currently has RIJ in place. Nephrology following. S/p dialysis 08/16/19. Will need TDC placed however will need INR <1.7. holding coumadin. INR today is 2.5.  -  Continue to hold coumadin -Repeat INR in AM -Vascular will take to OR if INR <1.7    New onset atrial fibrillation/flutter: CHADs-VASc score of 6, unclear etiology at this time. HR down to the 58. Currently in atrial flutter on telemetry. Not on any AV nodal blocking agents. Currently on triple therapy. Epistaxis has stopped.   HTN: BP stable today, continue to monitor.   For questions or updates, please contact [place] Please consult [email or web address] for contact info under Cardiology/STEMI.    Signed, Asencion Noble, MD   08/17/2019, 6:35 AM    I have seen and examined the patient along with Asencion Noble, MD.  I have reviewed the chart, notes and new data.  I agree with PA/NP's note.  Key new complaints: Developed profound symptomatic orthostatic hypotension when he tried to  stand up today.  More than 20 mmHg drop in blood pressure, resolved with sitting back down and reclining.  No longer receiving any vasoactive medications (previously on atenolol); denies angina or dyspnea. Key examination changes: Appeals elderly and weak.  Rhythm is regular (atrial flutter with high-grade AV block).  Clear lungs. Key new findings / data: Atrial flutter with mostly 4: 1 AV block on telemetry, no episodes of RVR.  INR 2.5.  PLAN: Start low-dose midodrine 3 times daily, which he may need every day or just on hemodialysis days.  Discussed the way the medication works and the fact that he should avoid being fully supine after taking it for at least 4 hours. On warfarin plus aspirin plus clopidogrel without overt bleeding.  Warfarin is currently on hold to allow placement of both semipermanent and permanent dialysis access, hopefully tomorrow. He will need extensive rehabilitation before returning home.  Sanda Klein, MD, [place] [phone] 08/17/2019, 9:43 AM

## 2019-08-17 NOTE — Care Management Important Message (Signed)
Important Message  Patient Details  Name: AERO DRUMMONDS MRN: [id number] Date of Birth: 1941/03/02   Medicare Important Message Given:  Yes     Shelda Altes 08/17/2019, 3:14 PM

## 2019-08-17 NOTE — Consult Note (Addendum)
Pt seen and examined.  He has no right radial or brachial pulse and known right subclavian artery occlusion.  He does have a 2+ left brachial pulse but no left radial pulse.  Will need left arm access.  Will plan for combo tunneled catheter and left arm AV graft vs fistula later this week when INR less than 1.7  Ruta Hinds, MD [facility] Office: [phone]

## 2019-08-17 NOTE — Progress Notes (Signed)
Left upper extremity vein mapping completed. Refer to "CV Proc" under chart review to view preliminary results.  08/17/2019 2:25 PM Kelby Aline., MHA, RVT, RDCS, RDMS

## 2019-08-17 NOTE — Progress Notes (Addendum)
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  Transferred to [facility] 08/09/2019.  Acute on chronic kidney injury sustained requiring CRRT 08/09/2019-08/12/2019.    PT Comments    Patient's mobility limited today secondary to orthostatic hypotension. Pt initially required Min A to stand from EOB but then needed Mod A of 2 on second stand due to dizziness/weakness. Not the best historian when explaining symptoms. Not able to tolerate gait today due to above. Tolerated SPT to chair with Assist of 2 for safety. MD present and aware of BP. Sp02 remained >91% on RA throughout session. Will continue to follow and progress as tolerated/allowed.  Supine BP 135/59 Sitting BP 108/64 Standing BP 86/48 Sitting BP post transfer 103/50, + light headedness.    Follow Up Recommendations  CIR;Supervision/Assistance - 24 hour     Equipment Recommendations  Rolling walker with 5" wheels;3in1 (PT)    Recommendations for Other Services       Precautions / Restrictions Precautions Precautions: Fall Precaution Comments: orthostatic Restrictions Weight Bearing Restrictions: No    Mobility  Bed Mobility Overal bed mobility: Needs Assistance Bed Mobility: Rolling Rolling: Min guard   Supine to sit: Min assist;HOB elevated     General bed mobility comments: Assist with trunk to get to EOB. Use of rail as well. + dizziness.  Transfers Overall transfer level: Needs assistance Equipment used: 2 person hand held assist Transfers: Sit to/from Stand Sit to Stand: Min assist;Mod assist;+2 physical assistance          General transfer comment: Initially Min A to stand from EOB, slow sinking down of BLEs in standing, able to self correct to upright but not sustain resulting in lowering down onto bed, + dizziness. Stood again requiring Mod A of 2. + orthostatic hypotension.  Ambulation/Gait Ambulation/Gait assistance: Min assist;+2 safety/equipment Gait Distance (Feet): 3 Feet Assistive device: 2 person hand held assist Gait Pattern/deviations: Step-to pattern;Trunk flexed     General Gait Details: Able to take a few steps to get to chair with assist for balance.   Stairs             Wheelchair Mobility    Modified Rankin (Stroke Patients Only)       Balance Overall balance assessment: Needs assistance Sitting-balance support: Feet supported;No upper extremity supported Sitting balance-Leahy Scale: Fair     Standing balance support: During functional activity Standing balance-Leahy Scale: Poor Standing balance comment: reliant on external support, upon standing, pt's LEs began sinking into knee/hip flexion requiring support, able to extend throughout with max cues but not sustain.                            Cognition Arousal/Alertness: Awake/alert Behavior During Therapy: WFL for tasks assessed/performed Overall Cognitive Status: Within Functional Limits for tasks assessed                                        Exercises      General Comments General comments (skin integrity, edema, etc.): Supine BP 135/59, Sitting BP  108/64, standing BP 86/48, sitting BP post transfer 103/50. SP02 >90% on RA.      Pertinent Vitals/Pain Pain Assessment: No/denies pain    Home Living                      Prior Function            PT Goals (current goals can now be found in the care plan section) Progress towards PT goals: Not progressing toward goals - comment(due to BP)    Frequency    Min 3X/week      PT Plan Current plan remains  appropriate    Co-evaluation              AM-PAC PT "6 Clicks" Mobility   Outcome Measure  Help needed turning from your back to your side while in a flat bed without using bedrails?: A Little Help needed moving from lying on your back to sitting on the side of a flat bed without using bedrails?: A Little Help needed moving to and from a bed to a chair (including a wheelchair)?: A Lot Help needed standing up from a chair using your arms (e.g., wheelchair or bedside chair)?: A Lot Help needed to walk in hospital room?: A Lot Help needed climbing 3-5 steps with a railing? : Total 6 Click Score: 13    End of Session Equipment Utilized During Treatment: Gait belt Activity Tolerance: Treatment limited secondary to medical complications (Comment)(orthostatic hypotension) Patient left: in chair;with chair alarm set;with call bell/phone within reach;Other (comment)(MD in room) Nurse Communication: Mobility status PT Visit Diagnosis: Muscle weakness (generalized) (M62.81);Difficulty in walking, not elsewhere classified (R26.2)     Time: 5686-1683 PT Time Calculation (min) (ACUTE ONLY): 23 min  Charges:  $Therapeutic Activity: 23-37 mins                     Marisa Severin, PT, DPT Acute Rehabilitation Services Pager [phone] Office [phone]       [name] 08/17/2019, 10:23 AM

## 2019-08-17 NOTE — Progress Notes (Signed)
ANTICOAGULATION CONSULT NOTE - Follow Up Consult  Pharmacy Consult for heparin and warfarin Indication: atrial fibrillation (CHADS2VASc = 7)  Allergies  Allergen Reactions  . Ace Inhibitors Other (See Comments)    Reaction:  Raises potassium   . Quinapril Rash and Other (See Comments)    hyperkalemia    Patient Measurements: Height: 6\' 2"  (188 cm) Weight: 199 lb 15.3 oz (90.7 kg) IBW/kg (Calculated) : 82.2 Heparin Dosing Weight: 90.7 kg  Vital Signs: Temp: 98.2 F (36.8 C) (11/03 0814) Temp Source: Oral (11/03 0814) BP: 127/45 (11/03 1045) Pulse Rate: 58 (11/03 1045)  Labs: Recent Labs    08/15/19 0410 08/15/19 1055 08/15/19 1634 08/16/19 0710 08/16/19 0711 08/17/19 0324  HGB 9.2*  --   --   --  8.7* 8.9*  HCT 28.7*  --   --   --  26.9* 27.2*  PLT 122*  --   --   --  140* 146*  LABPROT 25.8*  --   --   --  33.1* 26.3*  INR 2.4*  --   --   --  3.3* 2.5*  HEPARINUNFRC 0.40 0.52  --   --  0.33  --   CREATININE 6.80*  --  7.54* 8.03*  --  5.66*    Estimated Creatinine Clearance: 12.3 mL/min (A) (by C-G formula based on SCr of 5.66 mg/dL (H)).   Assessment: 78 yo M started on heparin for NSTEMI, s/p PCI with DES to SVG-RCA on 10/23 and continued for Afib (CHADS2VASc = 7). Warfarin was started 10/29 then held starting 11/2 for AV Fistula placement (goal INR <1.7 for OR). No anticoagulation prior to admission.   Continue to hold warfarin until cleared by vascular to restart after AV fistula placement. Ok to restart heparin per cardiology when INR is < 2.  Goal of Therapy:  INR 2-3 Heparin level 0.3-0.7 units/ml Monitor platelets by anticoagulation protocol: Yes  Plan:  -Hold warfarin for OR -Hold heparin for now; Restart Heparin when INR is < 2  -Monitor daily INR -Monitor for signs/symptoms of bleeding   Benetta Spar, PharmD, BCPS, BCCP Clinical Pharmacist  Please check AMION for all [place] phone numbers After 10:00 PM, call [place]

## 2019-08-17 NOTE — Progress Notes (Signed)
    INR 2.5 today it will need to be < 1.7 to proceed to the OR tomorrow. Plan for right UE access fistula verses graft pending vein mapping. and TDC placement.   Roxy Horseman PA-C

## 2019-08-17 NOTE — Progress Notes (Signed)
Initial Nutrition Assessment  DOCUMENTATION CODES:   Not applicable  INTERVENTION:    Nepro Shake po TID, each supplement provides 425 kcal and 19 grams protein thickened to nectar thick  Magic cup TID with meals, each supplement provides 290 kcal and 9 grams of protein  Renal MVI daily   NUTRITION DIAGNOSIS:   Increased nutrient needs related to post-op healing as evidenced by estimated needs.  GOAL:   Patient will meet greater than or equal to 90% of their needs  MONITOR:   Supplement acceptance, Weight trends, PO intake, Labs, I & O's, Skin  REASON FOR ASSESSMENT:   LOS    ASSESSMENT:   Patient with PMH significant for CAD s/p CABG, DM, HTN, HLD, and CKD IV. Presents this admission with NSTEMI, underwent cardiac cath/stenting to SVG. Transferred to [facility] after subsequent perforation.   10/23- cardiac cath, stenting to SVG 10/26- tx to [facility], start CRRT 10/29- stop CRRT 10/30- first HD, diet downgraded to D1 honey thick liquids   RD working remotely.  Plan for L arm AV graft vs fistula later this week per VS.   Unable to reach pt via phone. Pt seen by SLP two days prior, continue to recommend D1/honey thick liquids. Meal completions charted as 50-100% for pt's last eight meals. RD to provide supplements to maximize kcal and protein this admission. Will attempt to obtain more history if possible.   Weight shows to fluctuate over the last year. Admission weight: 99 kg Current weight 90.7 kg  I/O: -8,090 ml since admit UOP: 200 ml x 24 hrs  Last HD yesterday: 1500 net UF   Medications: aranesp, SS novolog, lantus Labs: Na 134 (L) CBG 174-223   Diet Order:   Diet Order            DIET - DYS 1 Room service appropriate? Yes with Assist; Fluid consistency: Honey Thick  Diet effective now              EDUCATION NEEDS:   Not appropriate for education at this time  Skin:  Skin Assessment: Skin Integrity Issues: Skin Integrity Issues:: Incisions, Other  (Comment) Incisions: buttocks Other: petechiae- bilateral thigh  Last BM:  11/1  Height:   Ht Readings from Last 1 Encounters:  08/17/19 6\' 2"  (1.88 m)    Weight:   Wt Readings from Last 1 Encounters:  08/17/19 90.7 kg    Ideal Body Weight:  86.4 kg  BMI:  Body mass index is 25.67 kg/m.  Estimated Nutritional Needs:   Kcal:  2300-2500 kcal  Protein:  120-135 grams  Fluid:  1000 ml + UOP    Mariana Single RD, LDN Clinical Nutrition Pager # [phone]

## 2019-08-18 ENCOUNTER — Inpatient Hospital Stay (HOSPITAL_COMMUNITY): Payer: Medicare HMO

## 2019-08-18 DIAGNOSIS — R0989 Other specified symptoms and signs involving the circulatory and respiratory systems: Secondary | ICD-10-CM

## 2019-08-18 LAB — RENAL FUNCTION PANEL
Albumin: 2.1 g/dL — ABNORMAL LOW (ref 3.5–5.0)
Anion gap: 13 (ref 5–15)
BUN: 50 mg/dL — ABNORMAL HIGH (ref 8–23)
CO2: 24 mmol/L (ref 22–32)
Calcium: 8.3 mg/dL — ABNORMAL LOW (ref 8.9–10.3)
Chloride: 100 mmol/L (ref 98–111)
Creatinine, Ser: 8.07 mg/dL — ABNORMAL HIGH (ref 0.61–1.24)
GFR calc Af Amer: 7 mL/min — ABNORMAL LOW (ref >60)
GFR calc non Af Amer: 6 mL/min — ABNORMAL LOW (ref >60)
Glucose, Bld: 177 mg/dL — ABNORMAL HIGH (ref 70–99)
Phosphorus: 6 mg/dL — ABNORMAL HIGH (ref 2.5–4.6)
Potassium: 3.9 mmol/L (ref 3.5–5.1)
Sodium: 137 mmol/L (ref 135–145)

## 2019-08-18 LAB — PROTIME-INR
INR: 2.1 — ABNORMAL HIGH (ref 0.8–1.2)
Prothrombin Time: 23.5 seconds — ABNORMAL HIGH (ref 11.4–15.2)

## 2019-08-18 LAB — GLUCOSE, CAPILLARY
Glucose-Capillary: 192 mg/dL — ABNORMAL HIGH (ref 70–99)
Glucose-Capillary: 245 mg/dL — ABNORMAL HIGH (ref 70–99)
Glucose-Capillary: 126 mg/dL — ABNORMAL HIGH (ref 70–99)
Glucose-Capillary: 178 mg/dL — ABNORMAL HIGH (ref 70–99)

## 2019-08-18 LAB — PTH, INTACT AND CALCIUM
Calcium, Total (PTH): 8 mg/dL — ABNORMAL LOW (ref 8.6–10.2)
PTH: 535 pg/mL — ABNORMAL HIGH (ref 15–65)

## 2019-08-18 LAB — HEPATITIS C ANTIBODY: HCV Ab: NONREACTIVE

## 2019-08-18 LAB — MAGNESIUM: Magnesium: 2.4 mg/dL (ref 1.7–2.4)

## 2019-08-18 MED ORDER — DARBEPOETIN ALFA 100 MCG/0.5ML IJ SOSY
PREFILLED_SYRINGE | INTRAMUSCULAR | Status: AC
  Administered 2019-08-18: 100 ug via INTRAVENOUS
  Filled 2019-08-18: qty 0.5

## 2019-08-18 MED ORDER — PENTAFLUOROPROP-TETRAFLUOROETH EX AERO: 1.0000 "application " | INHALATION_SPRAY | CUTANEOUS | Status: DC | PRN

## 2019-08-18 MED ORDER — SODIUM CHLORIDE 0.9 % IV SOLN: 100.0000 mL | INTRAVENOUS | Status: DC | PRN

## 2019-08-18 MED ORDER — ALTEPLASE 2 MG IJ SOLR: 2.0000 mg | Freq: Once | INTRAMUSCULAR | Status: DC | PRN

## 2019-08-18 MED ORDER — LIDOCAINE HCL (PF) 1 % IJ SOLN: 5.0000 mL | INTRAMUSCULAR | Status: DC | PRN

## 2019-08-18 MED ORDER — HEPARIN SODIUM (PORCINE) 1000 UNIT/ML DIALYSIS
1000.0000 [IU] | INTRAMUSCULAR | Status: DC | PRN
  Administered 2019-08-18: 16:00:00 2400 [IU] via INTRAVENOUS_CENTRAL
  Filled 2019-08-18 (×2): qty 1

## 2019-08-18 MED ORDER — HEPARIN SODIUM (PORCINE) 1000 UNIT/ML IJ SOLN
INTRAMUSCULAR | Status: AC
  Administered 2019-08-18: 2400 [IU] via INTRAVENOUS_CENTRAL
  Filled 2019-08-18: qty 3

## 2019-08-18 MED ORDER — LIDOCAINE-PRILOCAINE 2.5-2.5 % EX CREA
1.0000 "application " | TOPICAL_CREAM | CUTANEOUS | Status: DC | PRN
  Filled 2019-08-18: qty 5

## 2019-08-18 NOTE — Progress Notes (Signed)
Renal Navigator received call back from patient's daughter, who states she and family should be able to arrange transportation for her father's first couple treatments to give clinic time to assist with arranging community transportation. She requests to be called once patient has been accepted to a clinic and seat has been assigned.  Alphonzo Cruise, [place] Renal Navigator [phone]

## 2019-08-18 NOTE — Progress Notes (Signed)
[facility] NEPHROLOGY PROGRESS NOTE  Assessment/ Plan: Pt is a 78 y.o. yo male  W/ pmh of CAD s/p CABG, DM, HTN, HLD, CKD IV, who presented w/ NSTEMI, now s/p cardiac cath complicated by perforated SVG to RCA graft  # NSTEMI/CAD: s/p cardiac cath with perforation.  Cardiology following. Currently on plavix.  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  No obstruction on renal u/s. Required CRRT, which was stopped 10/29. Tolerating intermittent HD, last session 11/2. Vascular surgery consulted for [facility] and AV fistula, they plan for L AV graft/fistula and tunneled HD cath when INR < 1.7.  Continue to hold coumadin. Scheduled for dialysis today, on MWF sechedule.   - need to make arrangement for outpatient dialysis #Hypotension: blood pressure improved. MAP currently ~77.  # new onset a fib/flutter: off beta blocker.  Coumadin being held for procedure.  #anemia: iron sat 20%. IV iron and ESA during dialysis.  Monitor CBC # Secondary hyperparathyroidism: phosphorus elevated today at 6.0.  Consider starting phosphate binder. PTH still pending.   Subjective:  Patient feels better today. No chest pain/abdominal pain. No SOB.   Objective General:NAD, comfortable. Alert and oriented.  Heart:RRR, s1s2 nl Lungs:clear b/l, no crackle Abdomen:soft, Non-tender, non-distended Extremities:No edema Dialysis Access: right IJ temp cath  Vital signs in last 24 hours: Vitals:   08/17/19 1648 08/17/19 2102 08/18/19 0041 08/18/19 0412  BP: 124/72 (!) 142/49 (!) 151/53 (!) 137/53  Pulse: 60 (!) 59 64 63  Resp: 19 19 (!) 21 (!) 21  Temp: 97.7 F (36.5 C) 97.9 F (36.6 C) 98.2 F (36.8 C) 98.1 F (36.7 C)  TempSrc: Oral Oral Oral Oral  SpO2: 92% 90% 95% 94%  Weight:    89.5 kg  Height:       Weight change: -1.9 kg  Intake/Output Summary (Last 24 hours) at 08/18/2019 0758 Last data filed at 08/18/2019 0422 Gross per 24 hour  Intake 20 ml  Output 425  ml  Net -405 ml       Labs: Basic Metabolic Panel: Recent Labs  Lab 08/16/19 0710 08/17/19 0324 08/18/19 0433  NA 135 134* 137  K 4.0 3.7 3.9  CL 102 99 100  CO2 20* 24 24  GLUCOSE 200* 187* 177*  BUN 61* 31* 50*  CREATININE 8.03* 5.66* 8.07*  CALCIUM 8.2* 8.1* 8.3*  PHOS 4.4 4.3 6.0*   Liver Function Tests: Recent Labs  Lab 08/16/19 0710 08/17/19 0324 08/18/19 0433  ALBUMIN 2.0* 2.1* 2.1*   No results for input(s): LIPASE, AMYLASE in the last 168 hours. No results for input(s): AMMONIA in the last 168 hours. CBC: Recent Labs  Lab 08/13/19 0604 08/14/19 0244 08/15/19 0410 08/16/19 0711 08/17/19 0324  WBC 11.2* 11.9* 12.8* 12.2* 12.9*  HGB 10.1* 10.1* 9.2* 8.7* 8.9*  HCT 30.7* 30.5* 28.7* 26.9* 27.2*  MCV 100.0 97.8 100.0 100.7* 100.4*  PLT 120* 109* 122* 140* 146*   Cardiac Enzymes: No results for input(s): CKTOTAL, CKMB, CKMBINDEX, TROPONINI in the last 168 hours. CBG: Recent Labs  Lab 08/17/19 0604 08/17/19 1136 08/17/19 1711 08/17/19 2120 08/18/19 0625  GLUCAP 160* 223* 276* 202* 192*    Iron Studies:  Recent Labs    08/17/19 0324  IRON 54  TIBC 267  FERRITIN 116   Studies/Results: US Renal  Result Date: 08/16/2019 CLINICAL DATA:  Acute kidney injury EXAM: RENAL / URINARY TRACT ULTRASOUND COMPLETE COMPARISON:  11/05/2018 FINDINGS: Right Kidney: Renal measurements: 9.7 x  4.5 x 3.3 cm = volume: 76 mL. Increased renal cortical echogenicity. No mass or hydronephrosis visualized. Left Kidney: Renal measurements: 8.9 x 3.7 x 4.0 cm = volume: 68 mL. Increased renal cortical echogenicity. No mass or hydronephrosis visualized. Bladder: Decompressed by Foley catheter. Other: None. IMPRESSION: 1. Negative for obstructive uropathy. 2. Findings of chronic medical renal disease. Electronically Signed   By: Davina Poke M.D.   On: 08/16/2019 17:19   Vas [place] Upper Ext Vein Mapping (pre-op Avf)  Result Date: 08/17/2019 UPPER EXTREMITY VEIN MAPPING   Indications: Pre-access. Comparison Study: No prior study. Performing Technologist: Maudry Mayhew MHA, RDMS, RVT, RDCS  Examination Guidelines: A complete evaluation includes B-mode imaging, spectral Doppler, color Doppler, and power Doppler as needed of all accessible portions of each vessel. Bilateral testing is considered an integral part of a complete examination. Limited examinations for reoccurring indications may be performed as noted. +-----------------+-------------+----------+--------------+ Left Cephalic    Diameter (cm)Depth (cm)   Findings    +-----------------+-------------+----------+--------------+ Shoulder             0.27        0.75                  +-----------------+-------------+----------+--------------+ Prox upper arm       0.28        0.35                  +-----------------+-------------+----------+--------------+ Mid upper arm        0.32        0.22                  +-----------------+-------------+----------+--------------+ Dist upper arm       0.35        0.33                  +-----------------+-------------+----------+--------------+ Antecubital fossa    0.43        0.41                  +-----------------+-------------+----------+--------------+ Prox forearm         0.21        0.39     branching    +-----------------+-------------+----------+--------------+ Mid forearm          0.29        0.28                  +-----------------+-------------+----------+--------------+ Dist forearm                            not visualized +-----------------+-------------+----------+--------------+ Wrist                                   not visualized +-----------------+-------------+----------+--------------+ +-----------------+-------------+----------+--------------+ Left Basilic     Diameter (cm)Depth (cm)   Findings    +-----------------+-------------+----------+--------------+ Prox upper arm                          not  visualized +-----------------+-------------+----------+--------------+ Mid upper arm        0.39                              +-----------------+-------------+----------+--------------+ Dist upper arm       0.39                              +-----------------+-------------+----------+--------------+  Antecubital fossa    0.42                              +-----------------+-------------+----------+--------------+ Prox forearm         0.37                              +-----------------+-------------+----------+--------------+ Mid forearm          0.14                              +-----------------+-------------+----------+--------------+ Distal forearm                          not visualized +-----------------+-------------+----------+--------------+ Wrist                                   not visualized +-----------------+-------------+----------+--------------+ *See table(s) above for measurements and observations.  Diagnosing physician: Harold Barban MD Electronically signed by Harold Barban MD on 08/17/2019 at 5:38:53 PM.    Final     Medications: Infusions: . ferric gluconate (FERRLECIT/NULECIT) IV      Scheduled Medications: . aspirin EC  81 mg Oral Daily  . atorvastatin  80 mg Oral q1800  . Chlorhexidine Gluconate Cloth  6 each Topical Daily  . Chlorhexidine Gluconate Cloth  6 each Topical Q0600  . clopidogrel  75 mg Oral Daily  . darbepoetin (ARANESP) injection - DIALYSIS  100 mcg Intravenous Q Wed-HD  . feeding supplement (NEPRO CARB STEADY)  237 mL Oral TID BM  . insulin aspart  0-15 Units Subcutaneous TID WC  . insulin aspart  0-5 Units Subcutaneous QHS  . insulin glargine  15 Units Subcutaneous BID  . levothyroxine  300 mcg Oral Q0600  . midodrine  2.5 mg Oral TID WC  . multivitamin  1 tablet Oral QHS  . pantoprazole  40 mg Oral Daily  . sodium chloride flush  10-40 mL Intracatheter Q12H  . Warfarin - Pharmacist Dosing Inpatient   Does not apply  q1800    have reviewed scheduled and prn medications.   Benay Pike 08/18/2019,7:58 AM  LOS: 12 days  Pager: [phone]

## 2019-08-18 NOTE — Progress Notes (Signed)
Progress Note  Patient Name: [name] Date of Encounter: 08/18/2019  Primary Cardiologist: Corey Skains, MD   Subjective   O/N events: None. Continuing to hold coumadin in preparation for [facility] and access.   [name] was seen and evaluated at bedside today. He reports that he is feeling okay today, denies any chest pain, headaches, or palpitations. He does report some shortness of breath. He was up in the chair for at least an hour yesterday, did not stand up much after his episode of orthostatic hypotension.   Inpatient Medications    Scheduled Meds:  aspirin EC  81 mg Oral Daily   atorvastatin  80 mg Oral q1800   Chlorhexidine Gluconate Cloth  6 each Topical Daily   Chlorhexidine Gluconate Cloth  6 each Topical Q0600   clopidogrel  75 mg Oral Daily   darbepoetin (ARANESP) injection - DIALYSIS  100 mcg Intravenous Q Wed-HD   feeding supplement (NEPRO CARB STEADY)  237 mL Oral TID BM   insulin aspart  0-15 Units Subcutaneous TID WC   insulin aspart  0-5 Units Subcutaneous QHS   insulin glargine  15 Units Subcutaneous BID   levothyroxine  300 mcg Oral Q0600   midodrine  2.5 mg Oral TID WC   multivitamin  1 tablet Oral QHS   pantoprazole  40 mg Oral Daily   sodium chloride flush  10-40 mL Intracatheter Q12H   Warfarin - Pharmacist Dosing Inpatient   Does not apply q1800   Continuous Infusions:  ferric gluconate (FERRLECIT/NULECIT) IV     PRN Meds: acetaminophen, albuterol, food thickener, guaiFENesin, heparin, loperamide, nitroGLYCERIN, ondansetron (ZOFRAN) IV, Resource ThickenUp Clear, sodium chloride, sodium chloride flush   Vital Signs    Vitals:   08/17/19 1648 08/17/19 2102 08/18/19 0041 08/18/19 0412  BP: 124/72 (!) 142/49 (!) 151/53 (!) 137/53  Pulse: 60 (!) 59 64 63  Resp: 19 19 (!) 21 (!) 21  Temp: 97.7 F (36.5 C) 97.9 F (36.6 C) 98.2 F (36.8 C) 98.1 F (36.7 C)  TempSrc: Oral Oral Oral Oral  SpO2: 92% 90% 95% 94%  Weight:     89.5 kg  Height:        Intake/Output Summary (Last 24 hours) at 08/18/2019 0728 Last data filed at 08/18/2019 0422 Gross per 24 hour  Intake 20 ml  Output 425 ml  Net -405 ml   Filed Weights   08/16/19 1623 08/17/19 0356 08/18/19 0412  Weight: 90 kg 90.7 kg 89.5 kg    Telemetry    Atrial fibrillation/flutter, occasional PVCs- Personally Reviewed  ECG    None today  Physical Exam   GEN: No acute distress.   Neck: Right IJ in place Cardiac: Irregular rhythm, no murmurs, rubs, or gallops. Sternotomy scar.  Respiratory: Normal work of breathing, CTABL GI: Soft, nontender, non-distended  MS: No edema; No deformity. Neuro:  Nonfocal  Psych: Normal affect   Labs    Chemistry Recent Labs  Lab 08/16/19 0710 08/17/19 0324 08/18/19 0433  NA 135 134* 137  K 4.0 3.7 3.9  CL 102 99 100  CO2 20* 24 24  GLUCOSE 200* 187* 177*  BUN 61* 31* 50*  CREATININE 8.03* 5.66* 8.07*  CALCIUM 8.2* 8.1* 8.3*  ALBUMIN 2.0* 2.1* 2.1*  GFRNONAA 6* 9* 6*  GFRAA 7* 10* 7*  ANIONGAP 13 11 13      Hematology Recent Labs  Lab 08/15/19 0410 08/16/19 0711 08/17/19 0324  WBC 12.8* 12.2* 12.9*  RBC 2.87*  2.67* 2.71*  HGB 9.2* 8.7* 8.9*  HCT 28.7* 26.9* 27.2*  MCV 100.0 100.7* 100.4*  MCH 32.1 32.6 32.8  MCHC 32.1 32.3 32.7  RDW 15.8* 15.9* 15.9*  PLT 122* 140* 146*    Cardiac EnzymesNo results for input(s): TROPONINI in the last 168 hours. No results for input(s): TROPIPOC in the last 168 hours.   BNP Recent Labs  Lab 08/12/19 1505  BNP 353.8*     DDimer No results for input(s): DDIMER in the last 168 hours.   Radiology    US Renal  Result Date: 08/16/2019 CLINICAL DATA:  Acute kidney injury EXAM: RENAL / URINARY TRACT ULTRASOUND COMPLETE COMPARISON:  11/05/2018 FINDINGS: Right Kidney: Renal measurements: 9.7 x 4.5 x 3.3 cm = volume: 76 mL. Increased renal cortical echogenicity. No mass or hydronephrosis visualized. Left Kidney: Renal measurements: 8.9 x 3.7 x 4.0 cm =  volume: 68 mL. Increased renal cortical echogenicity. No mass or hydronephrosis visualized. Bladder: Decompressed by Foley catheter. Other: None. IMPRESSION: 1. Negative for obstructive uropathy. 2. Findings of chronic medical renal disease. Electronically Signed   By: Davina Poke M.D.   On: 08/16/2019 17:19   Vas [place] Upper Ext Vein Mapping (pre-op Avf)  Result Date: 08/17/2019 UPPER EXTREMITY VEIN MAPPING  Indications: Pre-access. Comparison Study: No prior study. Performing Technologist: Maudry Mayhew MHA, RDMS, RVT, RDCS  Examination Guidelines: A complete evaluation includes B-mode imaging, spectral Doppler, color Doppler, and power Doppler as needed of all accessible portions of each vessel. Bilateral testing is considered an integral part of a complete examination. Limited examinations for reoccurring indications may be performed as noted. +-----------------+-------------+----------+--------------+  Left Cephalic     Diameter (cm) Depth (cm)    Findings     +-----------------+-------------+----------+--------------+  Shoulder              0.27         0.75                    +-----------------+-------------+----------+--------------+  Prox upper arm        0.28         0.35                    +-----------------+-------------+----------+--------------+  Mid upper arm         0.32         0.22                    +-----------------+-------------+----------+--------------+  Dist upper arm        0.35         0.33                    +-----------------+-------------+----------+--------------+  Antecubital fossa     0.43         0.41                    +-----------------+-------------+----------+--------------+  Prox forearm          0.21         0.39      branching     +-----------------+-------------+----------+--------------+  Mid forearm           0.29         0.28                    +-----------------+-------------+----------+--------------+  Dist forearm  not  visualized  +-----------------+-------------+----------+--------------+  Wrist                                      not visualized  +-----------------+-------------+----------+--------------+ +-----------------+-------------+----------+--------------+  Left Basilic      Diameter (cm) Depth (cm)    Findings     +-----------------+-------------+----------+--------------+  Prox upper arm                             not visualized  +-----------------+-------------+----------+--------------+  Mid upper arm         0.39                                 +-----------------+-------------+----------+--------------+  Dist upper arm        0.39                                 +-----------------+-------------+----------+--------------+  Antecubital fossa     0.42                                 +-----------------+-------------+----------+--------------+  Prox forearm          0.37                                 +-----------------+-------------+----------+--------------+  Mid forearm           0.14                                 +-----------------+-------------+----------+--------------+  Distal forearm                             not visualized  +-----------------+-------------+----------+--------------+  Wrist                                      not visualized  +-----------------+-------------+----------+--------------+ *See table(s) above for measurements and observations.  Diagnosing physician: Harold Barban MD Electronically signed by Harold Barban MD on 08/17/2019 at 5:38:53 PM.    Final     Cardiac Studies   Cardiac Catheterization and Percutaneous Coronary Intervention10.23.2020  Conclusions: 1. See diagnostic angiogram by Dr. Nehemiah Massed for details of coronary/graft anatomy. 2. Severe disease involving SVG-RCA with 80-90% stenosis. 3. Successful PCI to SVG-RCA using Resolute Onyx 4.0 x 38 mm drug-eluting stent with 10% residual stenosis and TIMI-3 flow. 4. Focal perforation of the SVG-RCA during stent  deployment. This was successfully contained with internal tamponade.  Recommendations: 1. Dual antiplatelet therapy with aspirin and ticagrelor for at least 12 months. 2. Aggressive secondary prevention. 3. Transfer to [facility] for ICU monitoring, given focal perforation of SVG-RCA. If the patient were to become hemodynamically unstable or develop worsening anemia, repeat angiography of SVG-RCA and possible covered stent placement would need to be considered. _____________  2D Echocardiogram10.24.2020 IMPRESSIONS  1. Left ventricular ejection fraction, by visual estimation, is 60 to 65%. The left ventricle has normal function. Normal left ventricular size. There is  no left ventricular hypertrophy. 2. Global right ventricle has normal systolic function.The right ventricular size is normal. No increase in right ventricular wall thickness. 3. Left atrial size was normal. 4. Right atrial size was norma 5. Severe mitral annular calcification. Mild calcification of the anterior mitral valve leaflet(s).. No evidence of mitral valve regurgitation. Mild mitral stenosis. 6. The tricuspid valve is normal in structure. Tricuspid valve regurgitation is mild. 7. The aortic valve is tricuspid Aortic valve regurgitation is mild by color flow Doppler. Moderate aortic valve sclerosis/calcification without any evidence of aortic stenosis. 8. The pulmonic valve was normal in structure. Pulmonic valve regurgitation is not visualized by color flow Doppler. 9. Normal pulmonary artery systolic pressure. 10. The inferior vena cava is normal in size with greater than 50% respiratory variability, suggesting right atrial pressure of 3 mmHg. 11. No evidence of pericardial effusion. _____________   Patient Profile     78 y.o. male with a history of CAD s/p CABG, DM, HTN, HLD, CKD stage 4 who presented with an NSTEMI s/p cardiac catheterizaion complicated by perforated SVG to RCA graft who  was transferred from [facility] also with CKD stage 5 requiring iHD.   Assessment & Plan   NSTEMI/CAD: Perforation SVG to RCA, contained with internal tamponade. Continues to have some SOB which is improving already, denies any chest pain or palpitations. Currently on triple therapy because of atrial fibrillation and recent stent deployment. Continue ASA, and plavix. Continue ASA for 30 days post stent, 09/06/19, the decrease to plavix 75 mg daily and coumadin only for at least 1 year.  -Continue ASA and plavix -Hold coumadin for [facility] and access   CKD stage 5 with intermittent HD: Currently has RIJ in place. Nephrology following. S/p dialysis 08/16/19. Will need TDC placed however will need INR <1.7. holding coumadin. INR today is 2.1.  -Continue to hold coumadin -Repeat INR in AM -Vascular will take to OR if INR <1.7    New onset atrial fibrillation/flutter: CHADs-VASc score of 6, unclear etiology at this time. Currently in atrial flutter on telemetry. Not on any AV nodal blocking agents. Currently on triple therapy, holding coumadin as above. Epistaxis has stopped.   Orthostatic hypotension: Patient developed an episode of orthostatic hypotension when attempting to stand with PT yesterday, blood pressure down to 80/60 with some dizziness.  Started on midodrine 2.5 mg 3 times daily with meals.  Blood pressure today is stable.  Denies any lightheadedness or dizziness today however has not gotten up out of bed yet.   -Continue midodrine 2.5 mg 3 times daily WC  -Continue to monitor vitals  For questions or updates, please contact [place] Please consult [email or web address] for contact info under Cardiology/STEMI.    Signed, Asencion Noble, MD   08/18/2019, 7:28 AM    I have seen and examined the patient along with Asencion Noble, MD  .  I have reviewed the chart, notes and new data.  I agree with PA/NP's note.  Key new complaints: Appears more lively and energetic today.  Reports  being able to sit up in the chair without dizziness or over an hour yesterday.  Has not been up yet today.  Reports occasional transient shortness of breath at rest, no particular pattern.  Not currently dyspneic.  Denies angina. Key examination changes: No overt evidence of hypervolemia, although jugular veins are difficult to evaluate due to the presence of dialysis catheter.  Clear lungs, irregular rhythm. Key new findings / data: Stable hemoglobin.  INR  too high for invasive procedures today.   Note that the echo report states that the left atrium is "normal in size" but the reported end-systolic diameter is 5.4 cm on the 10/22 study from [facility] (which I am not able to review personally).  The left atrium does appear dilated on the limited echo performed here in Oct 24 (measurements were performed).   PLAN: Check orthostatic vital signs today, on midodrine. To start intravenous heparin when INR is less than 2.0.  Anticipate tunneled dialysis catheter and AV graft placement once INR 1.7 or less. Atrial fibrillation/coarse atrial flutter spontaneously well rate controlled and appears to be asymptomatic.  No plan for cardioversion at this point.  Can reconsider option for cardioversion/maintenance of sinus rhythm once we can guarantee uninterrupted anticoagulation for 3 weeks before cardioversion and 4 weeks after cardioversion.  Sanda Klein, MD, [place] [phone] 08/18/2019, 9:05 AM

## 2019-08-18 NOTE — Progress Notes (Signed)
SLP Cancellation Note  Patient Details Name: [name] MRN: [id number] DOB: [birth year]   Cancelled treatment:       Reason Eval/Treat Not Completed: Patient at procedure or test/unavailable. Pt in HD   Javione Gunawan, Katherene Ponto 08/18/2019, 3:36 PM

## 2019-08-18 NOTE — Progress Notes (Signed)
OT Cancellation Note  Patient Details Name: [name] MRN: [id number] DOB: 08/20/1941   Cancelled Treatment:    Reason Eval/Treat Not Completed: Patient at procedure or test/ unavailable(Pt at HD. OT to continue to follow-up with pt.)   OT to evaluate next available treatment day.  Ebony Hail Harold Hedge) Marsa Aris OTR/L Acute Rehabilitation Services Pager: [phone] Office: [place] 08/18/2019, 3:04 PM

## 2019-08-18 NOTE — Progress Notes (Addendum)
Left arm arterial duplex exam completed.   Preliminary results are in CV Proc in Chart Review.  Salvadore Dom, RVT

## 2019-08-18 NOTE — Progress Notes (Signed)
[facility] [place]  Subjective  - feels ok   Objective (!) 137/53 63 98.1 F (36.7 C) (Oral) (!) 21 94%  Intake/Output Summary (Last 24 hours) at 08/18/2019 0754 Last data filed at 08/18/2019 0422 Gross per 24 hour  Intake 20 ml  Output 425 ml  Net -405 ml    Assessment/Planning: INR 2.1 today Vein map shows reasonable cephalic vein  Since he does not have a left radial pulse will get left upper extremity arterial duplex today   brachial cephalic AVF left arm and catheter Friday by Dr Donnetta Hutching if INR < 1.7  Leukocytosis trending up per primary team  Ruta Hinds 08/18/2019 7:54 AM --  Laboratory Lab Results: Recent Labs    08/16/19 0711 08/17/19 0324  WBC 12.2* 12.9*  HGB 8.7* 8.9*  HCT 26.9* 27.2*  PLT 140* 146*   BMET Recent Labs    08/17/19 0324 08/18/19 0433  NA 134* 137  K 3.7 3.9  CL 99 100  CO2 24 24  GLUCOSE 187* 177*  BUN 31* 50*  CREATININE 5.66* 8.07*  CALCIUM 8.1* 8.3*    COAG Lab Results  Component Value Date   INR 2.1 (H) 08/18/2019   INR 2.5 (H) 08/17/2019   INR 3.3 (H) 08/16/2019   No results found for: PTT

## 2019-08-18 NOTE — Progress Notes (Signed)
Renal Navigator received call from patient's daughter stating that she has spoken with her mother who is "confused" about plan of care for patient. Renal Navigator reviewed record and explained that patient is being considered for CIR. Based on notes, Renal Navigator explained that this will be contingent upon OT eval and insurance approval. Renal Navigator explained that patient would get HD in the hospital while in CIR in admitted, and then will start in-center after that. Patient's daughter very appreciative.   Alphonzo Cruise, [place] Renal Navigator [phone]

## 2019-08-18 NOTE — Consult Note (Signed)
Inpatient Rehab Admissions:  Inpatient Rehab Consult received.  I met with pt and his wife at the bedside for rehabilitation assessment and to discuss goals and expectations of an inpatient rehab admission. Pt and his wife are very interested in the program and would like to pursue if his insurance approves. Feel this patient is a good candidate for CIR. He was Independent and active PTA and has good family system to support an IP Rehab stay. Will need to have OT evaluate prior to opening his insurance case for CIR equest. Will follow for OT note and then open his insurance case if CIR program recommended.   Jhonnie Garner, OTR/L  Rehab Admissions Coordinator  [phone] 08/18/2019 1:53 PM

## 2019-08-18 NOTE — H&P (View-Only) (Signed)
[facility] [place]  Subjective  - feels ok   Objective (!) 137/53 63 98.1 F (36.7 C) (Oral) (!) 21 94%  Intake/Output Summary (Last 24 hours) at 08/18/2019 0754 Last data filed at 08/18/2019 0422 Gross per 24 hour  Intake 20 ml  Output 425 ml  Net -405 ml    Assessment/Planning: INR 2.1 today Vein map shows reasonable cephalic vein  Since he does not have a left radial pulse will get left upper extremity arterial duplex today   brachial cephalic AVF left arm and catheter Friday by Dr Donnetta Hutching if INR < 1.7  Leukocytosis trending up per primary team  Ruta Hinds 08/18/2019 7:54 AM --  Laboratory Lab Results: Recent Labs    08/16/19 0711 08/17/19 0324  WBC 12.2* 12.9*  HGB 8.7* 8.9*  HCT 26.9* 27.2*  PLT 140* 146*   BMET Recent Labs    08/17/19 0324 08/18/19 0433  NA 134* 137  K 3.7 3.9  CL 99 100  CO2 24 24  GLUCOSE 187* 177*  BUN 31* 50*  CREATININE 5.66* 8.07*  CALCIUM 8.1* 8.3*    COAG Lab Results  Component Value Date   INR 2.1 (H) 08/18/2019   INR 2.5 (H) 08/17/2019   INR 3.3 (H) 08/16/2019   No results found for: PTT

## 2019-08-18 NOTE — Progress Notes (Signed)
ANTICOAGULATION CONSULT NOTE - Follow Up Consult  Pharmacy Consult for heparin and warfarin Indication: atrial fibrillation (CHADS2VASc = 7)  Allergies  Allergen Reactions  . Ace Inhibitors Other (See Comments)    Reaction:  Raises potassium   . Quinapril Rash and Other (See Comments)    hyperkalemia    Patient Measurements: Height: 6\' 2"  (188 cm) Weight: 197 lb 5 oz (89.5 kg) IBW/kg (Calculated) : 82.2 Heparin Dosing Weight: 90.7 kg  Vital Signs: Temp: 98.9 F (37.2 C) (11/04 0826) Temp Source: Axillary (11/04 0826) BP: 127/52 (11/04 0826) Pulse Rate: 65 (11/04 0826)  Labs: Recent Labs    08/15/19 1055  08/16/19 0710 08/16/19 0711 08/17/19 0324 08/18/19 0433 08/18/19 0434  HGB  --   --   --  8.7* 8.9*  --   --   HCT  --   --   --  26.9* 27.2*  --   --   PLT  --   --   --  140* 146*  --   --   LABPROT  --   --   --  33.1* 26.3*  --  23.5*  INR  --   --   --  3.3* 2.5*  --  2.1*  HEPARINUNFRC 0.52  --   --  0.33  --   --   --   CREATININE  --    < > 8.03*  --  5.66* 8.07*  --    < > = values in this interval not displayed.    Estimated Creatinine Clearance: 8.6 mL/min (A) (by C-G formula based on SCr of 8.07 mg/dL (H)).   Assessment: 78 yo M started on heparin for NSTEMI, s/p PCI with DES to SVG-RCA on 10/23 and continued for Afib (CHADS2VASC = 7). No anticoagulation prior to admission. Warfarin was started 10/29, then held starting 11/2 for AV Fistula placement (goal INR <1.7 for OR). Continue to hold warfarin until cleared by vascular to restart after AV fistula placement.   Ok to restart heparin per cardiology when INR is < 2. INR continues to trend down nicely to today.  Goal of Therapy:  INR 2-3 Heparin level 0.3-0.7 units/ml Monitor platelets by anticoagulation protocol: Yes  Plan:  -Hold warfarin for OR when INR<1.7 -Resume heparin when INR is < 2 - likely tomorrow AM -Monitor daily INR -Monitor CBC, for signs/symptoms of bleeding    Elicia Lamp, PharmD, BCPS Please check AMION for all [place] contact numbers Clinical Pharmacist 08/18/2019 9:24 AM

## 2019-08-18 NOTE — Progress Notes (Signed)
Renal Navigator has been notified by Nephrologist/Dr. Carolin Sicks that patient needs OP HD treatment referral. Navigator met with patient/wife who state they want patient to do PD at home and completed what sounds to be a tour of Home Therapy somewhere in [place], but he and his wife are not sure what clinic he went to for this. He states he follows with Dr. Percell Miller in [place]. Renal Navigator sees that this Nephrologist is with [facility].  Renal Navigator explained to patient and wife that patient must start with in-center HD at this time since he does not have a PD catheter placed yet, but that he can transition to PD in the future from the clinic. They seem somewhat surprised and a bit disappointed, but accepting. Renal Navigator spoke with patient about his options for in-center HD treatment from their home in [place], and both patient and wife state they want to go to the clinic closest to their home. This will be [facility]. Renal Navigator will submit referral, however, patient will first need to have additional HepB labs drawn, as [facility] referrals require entire HepB panel. Renal Navigator requested this of Nephrologist. Patient and wife state they do not drive and therefore do not have transportation to clinic. Renal Navigator asked if they could arrange transportation by family or friends to first few treatments until in-center staff can assist with arranging community based transportation in [place]. Patient's wife asked Renal Navigator to discuss all of this with their daughter/Jean. Renal Navigator left message for [name] at [phone]. Renal Navigator will continue to follow closely.  Alphonzo Cruise, [place] Renal Navigator [phone]

## 2019-08-19 LAB — RENAL FUNCTION PANEL
Albumin: 2.1 g/dL — ABNORMAL LOW (ref 3.5–5.0)
Anion gap: 11 (ref 5–15)
BUN: 28 mg/dL — ABNORMAL HIGH (ref 8–23)
CO2: 25 mmol/L (ref 22–32)
Calcium: 8.2 mg/dL — ABNORMAL LOW (ref 8.9–10.3)
Chloride: 102 mmol/L (ref 98–111)
Creatinine, Ser: 5.93 mg/dL — ABNORMAL HIGH (ref 0.61–1.24)
GFR calc Af Amer: 10 mL/min — ABNORMAL LOW (ref >60)
GFR calc non Af Amer: 8 mL/min — ABNORMAL LOW (ref >60)
Glucose, Bld: 219 mg/dL — ABNORMAL HIGH (ref 70–99)
Phosphorus: 4.5 mg/dL (ref 2.5–4.6)
Potassium: 4.1 mmol/L (ref 3.5–5.1)
Sodium: 138 mmol/L (ref 135–145)

## 2019-08-19 LAB — HEPATITIS B CORE ANTIBODY, TOTAL: Hep B Core Total Ab: REACTIVE — AB

## 2019-08-19 LAB — GLUCOSE, CAPILLARY
Glucose-Capillary: 202 mg/dL — ABNORMAL HIGH (ref 70–99)
Glucose-Capillary: 266 mg/dL — ABNORMAL HIGH (ref 70–99)
Glucose-Capillary: 176 mg/dL — ABNORMAL HIGH (ref 70–99)
Glucose-Capillary: 124 mg/dL — ABNORMAL HIGH (ref 70–99)

## 2019-08-19 LAB — MAGNESIUM: Magnesium: 2.1 mg/dL (ref 1.7–2.4)

## 2019-08-19 LAB — PROTIME-INR
INR: 2 — ABNORMAL HIGH (ref 0.8–1.2)
Prothrombin Time: 22.2 seconds — ABNORMAL HIGH (ref 11.4–15.2)

## 2019-08-19 LAB — HEPATITIS B SURFACE ANTIBODY, QUANTITATIVE: Hep B S AB Quant (Post): <3.1 m[IU]/mL — ABNORMAL LOW (ref >9.9)

## 2019-08-19 LAB — HEPARIN LEVEL (UNFRACTIONATED): Heparin Unfractionated: 0.14 IU/mL — ABNORMAL LOW (ref 0.30–0.70)

## 2019-08-19 MED ORDER — POLYETHYLENE GLYCOL 3350 17 G PO PACK
17.0000 g | PACK | Freq: Every day | ORAL | Status: DC
  Administered 2019-08-19: 17 g via ORAL
  Filled 2019-08-19: qty 1

## 2019-08-19 MED ORDER — POLYETHYLENE GLYCOL 3350 17 G PO PACK
17.0000 g | PACK | Freq: Two times a day (BID) | ORAL | Status: AC
  Filled 2019-08-19: qty 1

## 2019-08-19 MED ORDER — MIDODRINE HCL 5 MG PO TABS
5.0000 mg | ORAL_TABLET | Freq: Three times a day (TID) | ORAL | Status: DC
  Administered 2019-08-19 – 2019-08-21 (×7): 5 mg via ORAL
  Filled 2019-08-19 (×11): qty 1

## 2019-08-19 MED ORDER — HEPARIN (PORCINE) 25000 UT/250ML-% IV SOLN
1100.0000 [IU]/h | INTRAVENOUS | Status: DC
  Administered 2019-08-19: 850 [IU]/h via INTRAVENOUS
  Administered 2019-08-21 – 2019-08-23 (×3): 1100 [IU]/h via INTRAVENOUS
  Filled 2019-08-19 (×5): qty 250

## 2019-08-19 MED ORDER — CEFAZOLIN SODIUM-DEXTROSE 2-4 GM/100ML-% IV SOLN
2.0000 g | INTRAVENOUS | Status: AC
  Administered 2019-08-20: 2 g via INTRAVENOUS
  Filled 2019-08-19 (×2): qty 100

## 2019-08-19 MED ORDER — PHYTONADIONE 5 MG PO TABS
5.0000 mg | ORAL_TABLET | Freq: Once | ORAL | Status: AC
  Administered 2019-08-19: 5 mg via ORAL
  Filled 2019-08-19: qty 1

## 2019-08-19 MED ORDER — CHLORHEXIDINE GLUCONATE CLOTH 2 % EX PADS
6.0000 | MEDICATED_PAD | Freq: Every day | CUTANEOUS | Status: DC
  Administered 2019-08-19 – 2019-08-24 (×5): 6 via TOPICAL

## 2019-08-19 NOTE — Progress Notes (Signed)
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  Transferred to [facility] 08/09/2019.  Acute on chronic kidney injury sustained requiring CRRT 08/09/2019-08/12/2019.    PT Comments    Pt continues to be limited by orthostatic blood pressure; however, was improved from last visit.  His BP did decrease with transfers but with increased time at EOB symptoms resided and he was able to transfer to chair.  Pt stated he felt good sitting up in the chair, glad to be OOB.  Cont to advance as able.    Follow Up Recommendations  CIR;Supervision/Assistance - 24 hour     Equipment Recommendations  Rolling walker with 5" wheels;3in1 (PT)    Recommendations for Other Services       Precautions / Restrictions Precautions Precautions: Fall Precaution Comments: orthostatic Restrictions Weight Bearing Restrictions: No    Mobility  Bed Mobility Overal bed mobility: Needs Assistance Bed Mobility: Supine to Sit     Supine to sit: Supervision     : Transfers Overall transfer level: Needs assistance Equipment used: Rolling walker (2 wheeled) Transfers: Sit to/from Stand Sit to Stand: Min assist;From elevated surface         General transfer comment: cues to push up; performed x 3  Ambulation/Gait Ambulation/Gait assistance: Min assist Gait Distance (Feet): 3 Feet Assistive device: Rolling walker (2 wheeled) Gait Pattern/deviations: Decreased stride length;Trunk flexed;Shuffle     General Gait Details: Able to take a few steps to get to chair with assist for balance; cues for RW; further ambulation  limited by fatigue   Stairs             Wheelchair Mobility    Modified Rankin (Stroke Patients Only)       Balance Overall balance assessment: Needs assistance Sitting-balance support: Feet supported;No upper extremity supported Sitting balance-Leahy Scale: Fair Sitting balance - Comments: pt sits with BUE support   Standing balance support: Bilateral upper extremity supported Standing balance-Leahy Scale: Fair                              Cognition Arousal/Alertness: Awake/alert Behavior During Therapy: WFL for tasks assessed/performed Overall Cognitive Status: Within Functional Limits for tasks assessed                                        Exercises      General Comments General comments (skin integrity, edema, etc.): Supine BP 115/43, Sitting BP 111/48, Pt with some c/o dizziness so sat EOB 8 mins with min guard and encouraged AROM arms then bp up to 120/55; symptoms somewhat improved and pt able to transfer to chair; BP 104/43 immediatley in chair; feet propt and BP 96/53 but pt denies symptoms      Pertinent Vitals/Pain Pain Assessment: No/denies pain           PT Goals (current goals can now be found in the care plan section) Acute Rehab PT Goals Patient Stated Goal: "get better." Progress towards PT goals: Progressing toward goals    Frequency  Min 3X/week      PT Plan Current plan remains appropriate    Co-evaluation              AM-PAC PT "6 Clicks" Mobility   Outcome Measure  Help needed turning from your back to your side while in a flat bed without using bedrails?: A Little Help needed moving from lying on your back to sitting on the side of a flat bed without using bedrails?: A Little Help needed moving to and from a bed to a chair (including a wheelchair)?: A Little Help needed standing up from a chair using your arms (e.g., wheelchair or bedside chair)?: A Little Help needed to walk in  hospital room?: A Lot Help needed climbing 3-5 steps with a railing? : Total 6 Click Score: 15    End of Session Equipment Utilized During Treatment: Gait belt Activity Tolerance: Treatment limited secondary to medical complications (Comment) Patient left: in chair;with chair alarm set;with call bell/phone within reach;with family/visitor present Nurse Communication: Mobility status(BP and notified wife has questions for nurse) PT Visit Diagnosis: Muscle weakness (generalized) (M62.81);Difficulty in walking, not elsewhere classified (R26.2)     Time: 1194-1740 PT Time Calculation (min) (ACUTE ONLY): 25 min  Charges:  $Therapeutic Activity: 23-37 mins                     Maggie Font, PT Acute Rehab Services [phone]    [name] 08/19/2019, 1:14 PM

## 2019-08-19 NOTE — Progress Notes (Addendum)
Progress Note  Patient Name: [name] Date of Encounter: 08/19/2019  Primary Cardiologist: James Skains, MD   Subjective   O/N events: Had positive orthostatics yesterday. INR this morning was 2.   [name] was seen and evaluated at bedside today.  Reports feeling so-so today, denies any chest pain, shortness of breath, fatigue, or headaches.  Yesterday he does report some lightheadedness and dizziness, especially with the orthostatic test.  Reports that he did not get up out of bed yesterday.  Inpatient Medications    Scheduled Meds:  aspirin EC  81 mg Oral Daily   atorvastatin  80 mg Oral q1800   Chlorhexidine Gluconate Cloth  6 each Topical Daily   Chlorhexidine Gluconate Cloth  6 each Topical Q0600   clopidogrel  75 mg Oral Daily   darbepoetin (ARANESP) injection - DIALYSIS  100 mcg Intravenous Q Wed-HD   feeding supplement (NEPRO CARB STEADY)  237 mL Oral TID BM   insulin aspart  0-15 Units Subcutaneous TID WC   insulin aspart  0-5 Units Subcutaneous QHS   insulin glargine  15 Units Subcutaneous BID   levothyroxine  300 mcg Oral Q0600   midodrine  2.5 mg Oral TID WC   multivitamin  1 tablet Oral QHS   pantoprazole  40 mg Oral Daily   sodium chloride flush  10-40 mL Intracatheter Q12H   Continuous Infusions:  ferric gluconate (FERRLECIT/NULECIT) IV Stopped (08/18/19 1709)   PRN Meds: acetaminophen, albuterol, food thickener, guaiFENesin, heparin, loperamide, nitroGLYCERIN, ondansetron (ZOFRAN) IV, Resource ThickenUp Clear, sodium chloride, sodium chloride flush   Vital Signs    Vitals:   08/19/19 0000 08/19/19 0125 08/19/19 0130 08/19/19 0420  BP: (!) 67/43 (!) 64/54 (!) 135/49 (!) 137/49  Pulse: 64 (!) 59 64 63  Resp: (!) 24 20 (!) 22 20  Temp:    98.4 F (36.9 C)  TempSrc:    Oral  SpO2: 93% (!) 86% 93% 95%  Weight:    88.2 kg  Height:        Intake/Output Summary (Last 24 hours) at 08/19/2019 0749 Last data filed at 08/19/2019  0000 Gross per 24 hour  Intake 110.32 ml  Output 1600 ml  Net -1489.68 ml   Filed Weights   08/18/19 1335 08/18/19 1714 08/19/19 0420  Weight: 89.4 kg 87.3 kg 88.2 kg    Telemetry    Atrial fibrillation/flutter, occasional PVCs- Personally Reviewed  ECG    None today  Physical Exam   GEN: No acute distress.   Neck: Right IJ in place Cardiac: Irregular rhythm, no murmurs, rubs, or gallops. Sternotomy scar.  Respiratory: Normal work of breathing, CTABL GI: Soft, nontender, non-distended  MS: No edema; No deformity. Neuro:  Nonfocal  Psych: Normal affect   Labs    Chemistry Recent Labs  Lab 08/17/19 0324 08/18/19 0433 08/19/19 0500  NA 134* 137 138  K 3.7 3.9 4.1  CL 99 100 102  CO2 24 24 25   GLUCOSE 187* 177* 219*  BUN 31* 50* 28*  CREATININE 5.66* 8.07* 5.93*  CALCIUM 8.1*   8.0* 8.3* 8.2*  ALBUMIN 2.1* 2.1* 2.1*  GFRNONAA 9* 6* 8*  GFRAA 10* 7* 10*  ANIONGAP 11 13 11      Hematology Recent Labs  Lab 08/15/19 0410 08/16/19 0711 08/17/19 0324  WBC 12.8* 12.2* 12.9*  RBC 2.87* 2.67* 2.71*  HGB 9.2* 8.7* 8.9*  HCT 28.7* 26.9* 27.2*  MCV 100.0 100.7* 100.4*  MCH 32.1 32.6 32.8  MCHC 32.1 32.3 32.7  RDW 15.8* 15.9* 15.9*  PLT 122* 140* 146*    Cardiac EnzymesNo results for input(s): TROPONINI in the last 168 hours. No results for input(s): TROPIPOC in the last 168 hours.   BNP Recent Labs  Lab 08/12/19 1505  BNP 353.8*     DDimer No results for input(s): DDIMER in the last 168 hours.   Radiology    Vas [place] Upper Extremity Arterial Duplex  Result Date: 08/18/2019 UPPER EXTREMITY DUPLEX STUDY Indications: Doctor complains of weak radial arterial pulse felt. History:     Patient states he had an "A line" in left distal forearm.  Risk Factors:  Hypertension, hyperlipidemia, Diabetes, past history of smoking,                coronary artery disease, prior CVA. Other Factors: Patient has end stage 4 chronic renal disease. Performing Technologist:  Estill Batten Mackin : RVT, RDCS (AE), RDMS  Examination Guidelines: A complete evaluation includes B-mode imaging, spectral Doppler, color Doppler, and power Doppler as needed of all accessible portions of each vessel. Bilateral testing is considered an integral part of a complete examination. Limited examinations for reoccurring indications may be performed as noted.  Left Doppler Findings: +--------+----------+---------+------+--------+  Site     PSV (cm/s) Waveform  Plaque Comments  +--------+----------+---------+------+--------+  Brachial 168        biphasic                   +--------+----------+---------+------+--------+  Radial   154        triphasic                  +--------+----------+---------+------+--------+  Ulnar    135        triphasic                  +--------+----------+---------+------+--------+ Left diameters brachial proximal .66 cm, mid .62 cm, distal .63 cm. Radial artery diameters proximal .42cm, mid .29 cm, distal .24cm and .23 cm. Ulnar artery diameters proximal .31 cm, mid .37cm, distal .29cm.  Left Pre-Dialysis Findings: +-----------------------+----------+--------------------+---------+--------+  Location                PSV (cm/s) Intralum. Diam. (cm) Waveform  Comments  +-----------------------+----------+--------------------+---------+--------+  Brachial Antecub. fossa 117        0.64                 triphasic           +-----------------------+----------+--------------------+---------+--------+  Radial Art at Wrist     173        0.23                 triphasic           +-----------------------+----------+--------------------+---------+--------+  Ulnar Art at Wrist      169        0.29                 triphasic           +-----------------------+----------+--------------------+---------+--------+  Summary:  Left: No obstruction visualized in the left upper extremity There       are calcified walls noted in the arteries of the forearm. *See table(s) above for measurements and observations.  Electronically signed by Curt Jews MD on 08/18/2019 at 6:26:03 PM.    Final    Vas [place] Upper Ext Vein Mapping (pre-op Avf)  Result Date: 08/17/2019 UPPER EXTREMITY VEIN MAPPING  Indications: Pre-access. Comparison Study: No prior study. Performing  Technologist: Maudry Mayhew MHA, RDMS, RVT, RDCS  Examination Guidelines: A complete evaluation includes B-mode imaging, spectral Doppler, color Doppler, and power Doppler as needed of all accessible portions of each vessel. Bilateral testing is considered an integral part of a complete examination. Limited examinations for reoccurring indications may be performed as noted. +-----------------+-------------+----------+--------------+  Left Cephalic     Diameter (cm) Depth (cm)    Findings     +-----------------+-------------+----------+--------------+  Shoulder              0.27         0.75                    +-----------------+-------------+----------+--------------+  Prox upper arm        0.28         0.35                    +-----------------+-------------+----------+--------------+  Mid upper arm         0.32         0.22                    +-----------------+-------------+----------+--------------+  Dist upper arm        0.35         0.33                    +-----------------+-------------+----------+--------------+  Antecubital fossa     0.43         0.41                    +-----------------+-------------+----------+--------------+  Prox forearm          0.21         0.39      branching     +-----------------+-------------+----------+--------------+  Mid forearm           0.29         0.28                    +-----------------+-------------+----------+--------------+  Dist forearm                               not visualized  +-----------------+-------------+----------+--------------+  Wrist                                      not visualized  +-----------------+-------------+----------+--------------+  +-----------------+-------------+----------+--------------+  Left Basilic      Diameter (cm) Depth (cm)    Findings     +-----------------+-------------+----------+--------------+  Prox upper arm                             not visualized  +-----------------+-------------+----------+--------------+  Mid upper arm         0.39                                 +-----------------+-------------+----------+--------------+  Dist upper arm        0.39                                 +-----------------+-------------+----------+--------------+  Antecubital fossa     0.42                                 +-----------------+-------------+----------+--------------+  Prox forearm          0.37                                 +-----------------+-------------+----------+--------------+  Mid forearm           0.14                                 +-----------------+-------------+----------+--------------+  Distal forearm                             not visualized  +-----------------+-------------+----------+--------------+  Wrist                                      not visualized  +-----------------+-------------+----------+--------------+ *See table(s) above for measurements and observations.  Diagnosing physician: Harold Barban MD Electronically signed by Harold Barban MD on 08/17/2019 at 5:38:53 PM.    Final     Cardiac Studies   Cardiac Catheterization and Percutaneous Coronary Intervention10.23.2020  Conclusions: 1. See diagnostic angiogram by Dr. Nehemiah Massed for details of coronary/graft anatomy. 2. Severe disease involving SVG-RCA with 80-90% stenosis. 3. Successful PCI to SVG-RCA using Resolute Onyx 4.0 x 38 mm drug-eluting stent with 10% residual stenosis and TIMI-3 flow. 4. Focal perforation of the SVG-RCA during stent deployment. This was successfully contained with internal tamponade.  Recommendations: 1. Dual antiplatelet therapy with aspirin and ticagrelor for at least 12 months. 2. Aggressive secondary  prevention. 3. Transfer to [facility] for ICU monitoring, given focal perforation of SVG-RCA. If the patient were to become hemodynamically unstable or develop worsening anemia, repeat angiography of SVG-RCA and possible covered stent placement would need to be considered. _____________  2D Echocardiogram10.24.2020 IMPRESSIONS  1. Left ventricular ejection fraction, by visual estimation, is 60 to 65%. The left ventricle has normal function. Normal left ventricular size. There is no left ventricular hypertrophy. 2. Global right ventricle has normal systolic function.The right ventricular size is normal. No increase in right ventricular wall thickness. 3. Left atrial size was normal. 4. Right atrial size was norma 5. Severe mitral annular calcification. Mild calcification of the anterior mitral valve leaflet(s).. No evidence of mitral valve regurgitation. Mild mitral stenosis. 6. The tricuspid valve is normal in structure. Tricuspid valve regurgitation is mild. 7. The aortic valve is tricuspid Aortic valve regurgitation is mild by color flow Doppler. Moderate aortic valve sclerosis/calcification without any evidence of aortic stenosis. 8. The pulmonic valve was normal in structure. Pulmonic valve regurgitation is not visualized by color flow Doppler. 9. Normal pulmonary artery systolic pressure. 10. The inferior vena cava is normal in size with greater than 50% respiratory variability, suggesting right atrial pressure of 3 mmHg. 11. No evidence of pericardial effusion. _____________   Patient Profile     78 y.o. male with a history of CAD s/p CABG, DM, HTN, HLD, CKD stage 4 who presented with an NSTEMI s/p cardiac catheterizaion complicated by perforated SVG to RCA graft who was transferred from [facility] also with CKD stage 5 requiring iHD.   Assessment & Plan   NSTEMI/CAD: Perforation SVG to RCA, contained with internal tamponade.  Denies any shortness of breath  today, denies any chest pain or palpitation.  Remains on triple therapy. Continue ASA, and plavix.  Continue ASA for 30 days post stent, 09/06/19, then decrease to plavix 75 mg daily and coumadin only for at least 1 year.  -Continue ASA and plavix -Hold coumadin for [facility] and access, INR still not <1.7  CKD stage 5 with intermittent HD: Currently has RIJ in place. Nephrology following. S/p dialysis 08/16/19. Will need TDC and access placed however will need INR <1.7. holding coumadin. INR today is 2.0. Vascular ordered vitamin K for INR reversal.  -Continue to hold coumadin -Repeat INR in AM -Vascular will take to OR if INR <1.7  New onset atrial fibrillation/flutter: CHADs-VASc score of 6, unclear etiology at this time. Currently in atrial flutter on telemetry. Not on any AV nodal blocking agents. Currently on triple therapy, holding coumadin as above. Epistaxis has stopped.   Orthostatic hypotension: Noted during hospital admission, was started on midodrine 2.5 mg 3 times daily with meals.  Repeat orthostatics yesterday were positive, with symptoms.  Denies any symptoms at this time however given the persistent hypotension will increased midodrine.  -Increase midodrine to 5 mg TID -Recheck orthostatics  -Continue to monitor vitals  For questions or updates, please contact [place] Please consult [email or web address] for contact info under Cardiology/STEMI.   Signed, Asencion Noble, MD   08/19/2019, 7:49 AM    I have seen and examined the patient along with Asencion Noble, MD.  I have reviewed the chart, notes and new data.  I agree with her note.   Key new complaints: Became dizzy standing up again yesterday.  Denies angina and dyspnea. Key examination changes: Substantial difference in blood pressure between the left upper extremity (higher) and the right right upper extremity (lower), roughly 50 mmHg. Key new findings / data: INR 2.0  PLAN: Since he will require adequate  arterial flow for hemodialysis, will probably have AV graft placed in the dominant (left) arm.  In order to protect dialysis access, it may be preferable to check his blood pressure in the lower extremities. He does not have symptoms of intermittent claudication or vertebral steal syndrome. Defer additional evaluation to vascular surgery. Still has significant symptoms of orthostatic hypotension and will increase the dose of midodrine. Ask speech pathology if we can advance his diet to more solid food, since he finds the current diets rather unpalatable.  Sanda Klein, MD, [place] [phone] 08/19/2019, 9:32 AM

## 2019-08-19 NOTE — Progress Notes (Signed)
OP HD referral submitted to [facility], although Renal Navigator still awaiting HepB Core Antibody lab. Navigator will submit lab when it results. Renal Navigator will follow closely.  Alphonzo Cruise, [place] Renal Navigator [phone]

## 2019-08-19 NOTE — Progress Notes (Signed)
INR reversed Pharmacy to start heparin today  Plan is for left arm AV fistula and TDC placement tomorrow by Dr. Donnetta Hutching Consent ordered NPO after midnight INR check in the morning   Dagoberto Ligas, PA-C [facility] [phone] 08/19/2019  8:06 AM

## 2019-08-19 NOTE — Progress Notes (Signed)
Inpatient Rehabilitation-Admissions Coordinator   Will begin insurance authorization for possible admit.   Jhonnie Garner, OTR/L  Rehab Admissions Coordinator  [phone] 08/19/2019 2:13 PM

## 2019-08-19 NOTE — Progress Notes (Signed)
Referral to [facility] now complete with all necessary documents submitted.  Alphonzo Cruise, [place] Renal Navigator [phone]

## 2019-08-19 NOTE — Progress Notes (Signed)
[facility] NEPHROLOGY PROGRESS NOTE  Assessment/ Plan: Pt is a 78 y.o. yo male  W/ pmh of CAD s/p CABG, DM, HTN, HLD, CKD IV, who presented w/ NSTEMI, now s/p cardiac cath complicated by perforated SVG to RCA graft  # NSTEMI/CAD: s/p cardiac cath with perforation.  Cardiology following. Currently on plavix.   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  No obstruction on renal u/s. Required CRRT, which was stopped 10/29. Tolerating intermittent HD, had a session yesterday 11/4. On MWF HD schedule. Vascular surgery consulted for [facility] and AV fistula, they plan for L AV graft/fistula and tunneled HD cath when INR < 1.7.  Continue to hold coumadin. INR slowly downtrending, 2.0 today.  - nrenal navigator making arrangements for outpatient HD  #Hypotension: blood pressure improved. Most recent MAP above 65. BP measured overnight in right arm 59/46, left arm 128/47.   # new onset a fib/flutter: off beta blocker.  Coumadin being held for procedure.   #anemia: iron sat 20%. IV iron and ESA during dialysis.  Monitor CBC  # Secondary hyperparathyroidism: phosphorus normal today after HD yesterday. PTH elevated at 535, Ca 8.0, consistent with secondary hyperPTH.   Subjective:  Patient initially sleeping.  States he feels okay today.  He wanted to know if they would perform his surgery today, which I told him was unlikely d/t his INR being elevated still.  Pt not having any pain or difficulty breathing.   Objective General:NAD, comfortable, initially sleeping but easily awoke to sound of his name. Appears tired but alert.  Heart:RRR, s1s2 nl Lungs:clear b/l, no crackle Abdomen:soft, Non-tender, non-distended Extremities:No edema Dialysis Access: right IJ temp cath  Vital signs in last 24 hours: Vitals:   08/19/19 0000 08/19/19 0125 08/19/19 0130 08/19/19 0420  BP: (!) 67/43 (!) 64/54 (!) 135/49 (!) 137/49  Pulse: 64 (!) 59 64 63  Resp: (!) 24 20  (!) 22 20  Temp:    98.4 F (36.9 C)  TempSrc:    Oral  SpO2: 93% (!) 86% 93% 95%  Weight:    88.2 kg  Height:       Weight change: -0.1 kg  Intake/Output Summary (Last 24 hours) at 08/19/2019 0738 Last data filed at 08/19/2019 0000 Gross per 24 hour  Intake 110.32 ml  Output 1600 ml  Net -1489.68 ml       Labs: Basic Metabolic Panel: Recent Labs  Lab 08/17/19 0324 08/18/19 0433 08/19/19 0500  NA 134* 137 138  K 3.7 3.9 4.1  CL 99 100 102  CO2 24 24 25   GLUCOSE 187* 177* 219*  BUN 31* 50* 28*  CREATININE 5.66* 8.07* 5.93*  CALCIUM 8.1*  8.0* 8.3* 8.2*  PHOS 4.3 6.0* 4.5   Liver Function Tests: Recent Labs  Lab 08/17/19 0324 08/18/19 0433 08/19/19 0500  ALBUMIN 2.1* 2.1* 2.1*   No results for input(s): LIPASE, AMYLASE in the last 168 hours. No results for input(s): AMMONIA in the last 168 hours. CBC: Recent Labs  Lab 08/13/19 0604 08/14/19 0244 08/15/19 0410 08/16/19 0711 08/17/19 0324  WBC 11.2* 11.9* 12.8* 12.2* 12.9*  HGB 10.1* 10.1* 9.2* 8.7* 8.9*  HCT 30.7* 30.5* 28.7* 26.9* 27.2*  MCV 100.0 97.8 100.0 100.7* 100.4*  PLT 120* 109* 122* 140* 146*   Cardiac Enzymes: No results for input(s): CKTOTAL, CKMB, CKMBINDEX, TROPONINI in the last 168 hours. CBG: Recent Labs  Lab 08/18/19 0625 08/18/19 1113 08/18/19 1756 08/18/19 2129 08/19/19 0559  GLUCAP 192* 245* 126* 178* 202*    Iron Studies:  Recent Labs    08/17/19 0324  IRON 54  TIBC 267  FERRITIN 116   Studies/Results: Vas [place] Upper Extremity Arterial Duplex  Result Date: 08/18/2019 UPPER EXTREMITY DUPLEX STUDY Indications: Doctor complains of weak radial arterial pulse felt. History:     Patient states he had an "A line" in left distal forearm.  Risk Factors:  Hypertension, hyperlipidemia, Diabetes, past history of smoking,                coronary artery disease, prior CVA. Other Factors: Patient has end stage 4 chronic renal disease. Performing Technologist: Estill Batten Mackin : RVT,  RDCS (AE), RDMS  Examination Guidelines: A complete evaluation includes B-mode imaging, spectral Doppler, color Doppler, and power Doppler as needed of all accessible portions of each vessel. Bilateral testing is considered an integral part of a complete examination. Limited examinations for reoccurring indications may be performed as noted.  Left Doppler Findings: +--------+----------+---------+------+--------+ Site    PSV (cm/s)Waveform PlaqueComments +--------+----------+---------+------+--------+ [id number]       biphasic                +--------+----------+---------+------+--------+ Radial  154       triphasic               +--------+----------+---------+------+--------+ Ulnar   135       triphasic               +--------+----------+---------+------+--------+ Left diameters brachial proximal .66 cm, mid .62 cm, distal .63 cm. Radial artery diameters proximal .42cm, mid .29 cm, distal .24cm and .23 cm. Ulnar artery diameters proximal .31 cm, mid .37cm, distal .29cm.  Left Pre-Dialysis Findings: +-----------------------+----------+--------------------+---------+--------+ Location               PSV (cm/s)Intralum. Diam. (cm)Waveform Comments +-----------------------+----------+--------------------+---------+--------+ Brachial Antecub. fossa117       0.64                triphasic         +-----------------------+----------+--------------------+---------+--------+ Radial Art at Wrist    173       0.23                triphasic         +-----------------------+----------+--------------------+---------+--------+ Ulnar Art at Wrist     169       0.29                triphasic         +-----------------------+----------+--------------------+---------+--------+  Summary:  Left: No obstruction visualized in the left upper extremity There       are calcified walls noted in the arteries of the forearm. *See table(s) above for measurements and observations. Electronically signed  by Curt Jews MD on 08/18/2019 at 6:26:03 PM.    Final    Vas [place] Upper Ext Vein Mapping (pre-op Avf)  Result Date: 08/17/2019 UPPER EXTREMITY VEIN MAPPING  Indications: Pre-access. Comparison Study: No prior study. Performing Technologist: Maudry Mayhew MHA, RDMS, RVT, RDCS  Examination Guidelines: A complete evaluation includes B-mode imaging, spectral Doppler, color Doppler, and power Doppler as needed of all accessible portions of each vessel. Bilateral testing is considered an integral part of a complete examination. Limited examinations for reoccurring indications may be performed as noted. +-----------------+-------------+----------+--------------+ Left Cephalic    Diameter (cm)Depth (cm)   Findings    +-----------------+-------------+----------+--------------+ Shoulder             0.27  0.75                  +-----------------+-------------+----------+--------------+ Prox upper arm       0.28        0.35                  +-----------------+-------------+----------+--------------+ Mid upper arm        0.32        0.22                  +-----------------+-------------+----------+--------------+ Dist upper arm       0.35        0.33                  +-----------------+-------------+----------+--------------+ Antecubital fossa    0.43        0.41                  +-----------------+-------------+----------+--------------+ Prox forearm         0.21        0.39     branching    +-----------------+-------------+----------+--------------+ Mid forearm          0.29        0.28                  +-----------------+-------------+----------+--------------+ Dist forearm                            not visualized +-----------------+-------------+----------+--------------+ Wrist                                   not visualized +-----------------+-------------+----------+--------------+ +-----------------+-------------+----------+--------------+ Left Basilic      Diameter (cm)Depth (cm)   Findings    +-----------------+-------------+----------+--------------+ Prox upper arm                          not visualized +-----------------+-------------+----------+--------------+ Mid upper arm        0.39                              +-----------------+-------------+----------+--------------+ Dist upper arm       0.39                              +-----------------+-------------+----------+--------------+ Antecubital fossa    0.42                              +-----------------+-------------+----------+--------------+ Prox forearm         0.37                              +-----------------+-------------+----------+--------------+ Mid forearm          0.14                              +-----------------+-------------+----------+--------------+ Distal forearm                          not visualized +-----------------+-------------+----------+--------------+ Wrist  not visualized +-----------------+-------------+----------+--------------+ *See table(s) above for measurements and observations.  Diagnosing physician: Harold Barban MD Electronically signed by Harold Barban MD on 08/17/2019 at 5:38:53 PM.    Final     Medications: Infusions: . ferric gluconate (FERRLECIT/NULECIT) IV Stopped (08/18/19 1709)    Scheduled Medications: . aspirin EC  81 mg Oral Daily  . atorvastatin  80 mg Oral q1800  . Chlorhexidine Gluconate Cloth  6 each Topical Daily  . Chlorhexidine Gluconate Cloth  6 each Topical Q0600  . clopidogrel  75 mg Oral Daily  . darbepoetin (ARANESP) injection - DIALYSIS  100 mcg Intravenous Q Wed-HD  . feeding supplement (NEPRO CARB STEADY)  237 mL Oral TID BM  . insulin aspart  0-15 Units Subcutaneous TID WC  . insulin aspart  0-5 Units Subcutaneous QHS  . insulin glargine  15 Units Subcutaneous BID  . levothyroxine  300 mcg Oral Q0600  . midodrine  2.5 mg Oral TID WC  .  multivitamin  1 tablet Oral QHS  . pantoprazole  40 mg Oral Daily  . sodium chloride flush  10-40 mL Intracatheter Q12H    have reviewed scheduled and prn medications.   Benay Pike 08/19/2019,7:38 AM  LOS: 13 days  Pager: [phone]

## 2019-08-19 NOTE — Progress Notes (Signed)
ANTICOAGULATION CONSULT NOTE - Follow Up Consult  Pharmacy Consult for heparin and warfarin Indication: atrial fibrillation (CHADS2VASc = 7)  Allergies  Allergen Reactions  . Ace Inhibitors Other (See Comments)    Reaction:  Raises potassium   . Quinapril Rash and Other (See Comments)    hyperkalemia    Patient Measurements: Height: 6\' 2"  (188 cm) Weight: 194 lb 7.1 oz (88.2 kg) IBW/kg (Calculated) : 82.2 Heparin Dosing Weight: 90.7 kg  Vital Signs: Temp: 98.7 F (37.1 C) (11/05 0809) Temp Source: Axillary (11/05 0809) BP: 133/44 (11/05 0809) Pulse Rate: 66 (11/05 0809)  Labs: Recent Labs    08/17/19 0324 08/18/19 0433 08/18/19 0434 08/19/19 0500  HGB 8.9*  --   --   --   HCT 27.2*  --   --   --   PLT 146*  --   --   --   LABPROT 26.3*  --  23.5* 22.2*  INR 2.5*  --  2.1* 2.0*  CREATININE 5.66* 8.07*  --  5.93*    Estimated Creatinine Clearance: 11.7 mL/min (A) (by C-G formula based on SCr of 5.93 mg/dL (H)).   Assessment: 78 yo M started on heparin for NSTEMI, s/p PCI with DES to SVG-RCA on 10/23 and continued for Afib (CHADS2VASC = 7). No anticoagulation prior to admission. Warfarin was started 10/29, then held starting 11/2 for AV Fistula placement (goal INR <1.7 for OR). Continue to hold warfarin until cleared by vascular to restart after AV fistula placement occurs.   Pharmacy consulted to dose heparin when INR<2. INR trend down a bit to 2.0 today. Vascular ordered vitamin K 5mg  PO x 1 today with plans for OR tomorrow for AVF placement. Will start heparin at 1400 today, as anticipate INR will be below 2 by that time. Previously therapeutic this admit on heparin rate 850 units/hr. CBC low but stable. No active bleed issues reported.   Goal of Therapy:  Heparin level 0.3-0.7 units/ml Monitor platelets by anticoagulation protocol: Yes  Plan:  Start heparin at previously therapeutic rate 850 units/hr (at 1400) with no bolus 8h heparin level from start Monitor  daily heparin level and CBC, s/sx bleeding Warfarin on hold for planned OR procedure tomorrow   Elicia Lamp, PharmD, BCPS Please check AMION for all [place] contact numbers Clinical Pharmacist 08/19/2019 8:52 AM

## 2019-08-19 NOTE — Progress Notes (Signed)
Compared BP right arm is lower than left arm.  Right arm 59/46 mmHg, radial pulse is 1+ documented in Pt's chart as inaccurate reading. Left arm 128/47 mmHg, radial pulse is 2+.  EKG showed Atrial fib/Atrial flutter on monitor, HR  60s. SPO2 room air 86-88% with some nasal congestion with non productive cough. O2 NCL 3-4 LPM given to maintain SPO2 > 92% Will continue to monitor.  Johnson Arizola [id number],EA

## 2019-08-19 NOTE — Progress Notes (Signed)
  Speech Language Pathology Treatment: Dysphagia  Patient Details Name: [name] MRN: [id number] DOB: [birth year] Today's Date: 08/19/2019 Time: 5809-9833 SLP Time Calculation (min) (ACUTE ONLY): 15 min  Assessment / Plan / Recommendation Clinical Impression  Pt seen at bedside for education. Pt's wife present, and reports pt does not like his current diet (puree with honey thick liquids via teaspoon with head turn to the right), despite indicating that he did not want solid textures to SLP during MBS. No difficulty with tolerance of these consistencies were reported. Both pt and his wife indicate pt's R CEA was in April of 2020, however, chart review indicates this surgery was in October of 2018. Pt was educated regarding results of MBS completed 08/13/2019, including SILENT ASPIRATION of all liquids.   Pt for surgical procedure tomorrow. Will plan to repeat MBS in the near future to assess readiness to advance/liberalize diet.    HPI HPI: Pt is a 78 year old male admitted to [facility] with NSTEMI s/p cardiac cath on 82/50, complicated by a small localized perforation of SVG during stent placement. He was subsequently transferred to [facility].  Pt developed progressive renal failure and associated respiratory failure. SLP was ordered as pt was noted to be coughing iwth meals. Pt had a R CEA in October 2018, after which he had hoarseness and trouble swallowing. [name] admission later that month for PNA, MBS was completed that showed adequate oropharyngeal swallow with no aspiration, occasional trace penetration with thin liquids. ENT performed right thyroplasty 01/05/18, which improved vocal quality, but pt reports no improvements in swallowing. Previous BSE in 2017 also [name]. PMH also includes: stage IV CKD, weight loss, thyroid disease, CVA, MI, CAD, HTN, HLD, DM, colon ca s/p resection      SLP Plan  Continue with current plan of care       Recommendations  Diet recommendations:  Dysphagia 1 (puree);Honey-thick liquid Liquids provided via: Teaspoon(liquids via teaspoon with head turned to the right) Medication Administration: Crushed with puree Supervision: Patient able to self feed Compensations: Slow rate;Small sips/bites;Other (Comment)                Oral Care Recommendations: Oral care BID Follow up Recommendations: (TBA) SLP Visit Diagnosis: Dysphagia, pharyngeal phase (R13.13) Plan: Continue with current plan of care       [name], [facility], [name] Pathologist Office: [phone] Pager: [phone]  Shonna Chock 08/19/2019, 2:22 PM

## 2019-08-19 NOTE — Evaluation (Signed)
Occupational Therapy Evaluation Patient Details Name: [name] MRN: [id number] DOB: Jul 17, 1941 Today's Date: 08/19/2019    History of Present Illness Pt is a 78 year old gentleman with a history of coronary artery status post CABG 1997, diabetes, hypertension, hyperlipidemia, chronic kidney disease stage IV at baseline who presents with non-ST segment elevated MI I s/p cardiac catheterization stent complicated by perforation.  Transferred to [facility] 08/09/2019.  Acute on chronic kidney injury sustained requiring CRRT 08/09/2019-08/12/2019.   Clinical Impression   Pt admitted with the above diagnoses and presents with below problem list. Pt will benefit from continued acute OT to address the below listed deficits and maximize independence with basic ADLs prior to d/c to venue below. PTA pt was independent with ADLs, drives, does yard work. Pt currently mod A with UB ADLs and +2 mod A with LB ADLs. Session limited by cardiopulmonary status and pt reporting feeling poorly. Vitals assessed: supine at start of session HR 65, bp 99/40; sitting EOB HR 126 115/43. Pt sat for BP assessment then needed to lay back down. Given PLOF feel once pt is medically stable and symptoms improve he will progress well with acute OT goals.     Follow Up Recommendations  CIR    Equipment Recommendations  Other (comment)(TBD next venue of care)    Recommendations for Other Services       Precautions / Restrictions Precautions Precautions: Fall Precaution Comments: orthostatic Restrictions Weight Bearing Restrictions: No      Mobility Bed Mobility Overal bed mobility: Needs Assistance Bed Mobility: Supine to Sit     Supine to sit: Min assist;HOB elevated     General bed mobility comments: Assist with trunk to get to EOB. Use of rail as well. + dizziness.  Transfers                 General transfer comment: unable this session due to tachycardia sitting EOB, low bp, and  dizziness    Balance Overall balance assessment: Needs assistance Sitting-balance support: Feet supported;No upper extremity supported Sitting balance-Leahy Scale: Fair Sitting balance - Comments: pt sits with BUE support                                   ADL either performed or assessed with clinical judgement   ADL Overall ADL's : Needs assistance/impaired Eating/Feeding: Set up;Sitting   Grooming: Minimal assistance;Sitting   Upper Body Bathing: Moderate assistance;Sitting   Lower Body Bathing: +2 for safety/equipment;+2 for physical assistance;Moderate assistance   Upper Body Dressing : Moderate assistance;Sitting   Lower Body Dressing: Moderate assistance;+2 for physical assistance;+2 for safety/equipment                 General ADL Comments: Session limited by tachycardia sitting EOB, low bp, and pt feeling poorly. LB ADL assist levels per chart review and clinical judgement     Vision         Perception     Praxis      Pertinent Vitals/Pain Pain Assessment: No/denies pain     Hand Dominance Left   Extremity/Trunk Assessment Upper Extremity Assessment Upper Extremity Assessment: Generalized weakness   Lower Extremity Assessment Lower Extremity Assessment: Defer to PT evaluation       Communication     Cognition Arousal/Alertness: Awake/alert Behavior During Therapy: WFL for tasks assessed/performed Overall Cognitive Status: Within Functional Limits for tasks assessed  General Comments       Exercises     Shoulder Instructions      Home Living Family/patient expects to be discharged to:: Private residence Living Arrangements: Spouse/significant other Available Help at Discharge: Family Type of Home: Mobile home Home Access: Stairs to enter Entrance Stairs-Number of Steps: 3 Entrance Stairs-Rails: Right;Left Home Layout: One level                           Prior Functioning/Environment Level of Independence: Independent        Comments: Drives, does yard work        OT Problem List: Decreased strength;Decreased activity tolerance;Impaired balance (sitting and/or standing);Decreased knowledge of use of DME or AE;Decreased knowledge of precautions;Cardiopulmonary status limiting activity      OT Treatment/Interventions: Self-care/ADL training;Therapeutic exercise;Energy conservation;DME and/or AE instruction;Therapeutic activities;Patient/family education;Balance training    OT Goals(Current goals can be found in the care plan section) Acute Rehab OT Goals Patient Stated Goal: "get better." OT Goal Formulation: With patient Time For Goal Achievement: 09/02/19 Potential to Achieve Goals: Good ADL Goals Pt Will Perform Grooming: with set-up;sitting Pt Will Perform Upper Body Bathing: with set-up;sitting Pt Will Perform Lower Body Bathing: with min guard assist;sit to/from stand Pt Will Transfer to Toilet: with min guard assist;ambulating Pt Will Perform Toileting - Clothing Manipulation and hygiene: with min guard assist;sit to/from stand  OT Frequency: Min 2X/week   Barriers to D/C:            Co-evaluation              AM-PAC OT "6 Clicks" Daily Activity     Outcome Measure Help from another person eating meals?: None Help from another person taking care of personal grooming?: A Little Help from another person toileting, which includes using toliet, bedpan, or urinal?: A Lot Help from another person bathing (including washing, rinsing, drying)?: A Lot Help from another person to put on and taking off regular upper body clothing?: A Little Help from another person to put on and taking off regular lower body clothing?: A Lot 6 Click Score: 16   End of Session Equipment Utilized During Treatment: Oxygen  Activity Tolerance: Other (comment)(limited by tachycardia sitting EOB, low bp, and dizziness) Patient left: in  bed;with call bell/phone within reach;with bed alarm set  OT Visit Diagnosis: Unsteadiness on feet (R26.81);Muscle weakness (generalized) (M62.81)                Time: 2035-5974 OT Time Calculation (min): 13 min Charges:  OT General Charges $OT Visit: 1 Visit OT Evaluation $OT Eval Low Complexity: [place], OT Acute Rehabilitation Services Pager: [phone] Office: [phone]   Hortencia Pilar 08/19/2019, 10:35 AM

## 2019-08-19 NOTE — Anesthesia Preprocedure Evaluation (Addendum)
Anesthesia Evaluation  Patient identified by MRN, date of birth, ID band  Reviewed: Allergy & Precautions, NPO status , Patient's Chart, lab work & pertinent test results  Airway Mallampati: II  TM Distance: >3 FB Neck ROM: Full    Dental no notable dental hx. (+) Edentulous Upper, Partial Lower   Pulmonary former smoker,    Pulmonary exam normal breath sounds clear to auscultation       Cardiovascular Exercise Tolerance: Good hypertension, Pt. on medications + CAD and + Peripheral Vascular Disease  Normal cardiovascular exam+ dysrhythmias Atrial Fibrillation  Rhythm:Regular Rate:Normal     Neuro/Psych CVA negative psych ROS   GI/Hepatic negative GI ROS, Neg liver ROS,   Endo/Other  diabetesHypothyroidism   Renal/GU CRF and Renal InsufficiencyRenal diseaseK+ 4.1 Cr 5.93     Musculoskeletal negative musculoskeletal ROS (+)   Abdominal   Peds  Hematology  (+) anemia , Hgb 8.9   Anesthesia Other Findings   Reproductive/Obstetrics                            Anesthesia Physical Anesthesia Plan  ASA: IV  Anesthesia Plan: General   Post-op Pain Management:    Induction: Intravenous  PONV Risk Score and Plan: 3 and Treatment may vary due to age or medical condition, Ondansetron and Dexamethasone  Airway Management Planned: LMA  Additional Equipment: None  Intra-op Plan:   Post-operative Plan:   Informed Consent: I have reviewed the patients History and Physical, chart, labs and discussed the procedure including the risks, benefits and alternatives for the proposed anesthesia with the patient or authorized representative who has indicated his/her understanding and acceptance.     Dental advisory given  Plan Discussed with: CRNA  Anesthesia Plan Comments: (GA w LMA)       Anesthesia Quick Evaluation

## 2019-08-20 ENCOUNTER — Inpatient Hospital Stay (HOSPITAL_COMMUNITY): Payer: Medicare HMO

## 2019-08-20 ENCOUNTER — Inpatient Hospital Stay (HOSPITAL_COMMUNITY): Payer: Medicare HMO | Admitting: Anesthesiology

## 2019-08-20 ENCOUNTER — Encounter (HOSPITAL_COMMUNITY)
Admission: AD | Disposition: A | Discharge status: Hospice | Payer: Self-pay | Source: Other Acute Inpatient Hospital | Attending: Internal Medicine

## 2019-08-20 ENCOUNTER — Encounter (HOSPITAL_COMMUNITY): Payer: Self-pay | Admitting: *Deleted

## 2019-08-20 DIAGNOSIS — Z515 Encounter for palliative care: Secondary | ICD-10-CM | POA: Diagnosis not present

## 2019-08-20 DIAGNOSIS — N179 Acute kidney failure, unspecified: Secondary | ICD-10-CM | POA: Diagnosis not present

## 2019-08-20 DIAGNOSIS — J9601 Acute respiratory failure with hypoxia: Secondary | ICD-10-CM | POA: Diagnosis not present

## 2019-08-20 DIAGNOSIS — I25810 Atherosclerosis of coronary artery bypass graft(s) without angina pectoris: Secondary | ICD-10-CM | POA: Diagnosis not present

## 2019-08-20 DIAGNOSIS — Z66 Do not resuscitate: Secondary | ICD-10-CM | POA: Diagnosis not present

## 2019-08-20 DIAGNOSIS — I4892 Unspecified atrial flutter: Secondary | ICD-10-CM | POA: Diagnosis not present

## 2019-08-20 DIAGNOSIS — I132 Hypertensive heart and chronic kidney disease with heart failure and with stage 5 chronic kidney disease, or end stage renal disease: Secondary | ICD-10-CM | POA: Diagnosis not present

## 2019-08-20 DIAGNOSIS — N185 Chronic kidney disease, stage 5: Secondary | ICD-10-CM

## 2019-08-20 DIAGNOSIS — I5031 Acute diastolic (congestive) heart failure: Secondary | ICD-10-CM | POA: Diagnosis not present

## 2019-08-20 DIAGNOSIS — N186 End stage renal disease: Secondary | ICD-10-CM | POA: Diagnosis not present

## 2019-08-20 DIAGNOSIS — T82218A Other mechanical complication of coronary artery bypass graft, initial encounter: Secondary | ICD-10-CM | POA: Diagnosis not present

## 2019-08-20 HISTORY — PX: AV FISTULA PLACEMENT: SHX1204

## 2019-08-20 HISTORY — PX: INSERTION OF DIALYSIS CATHETER: SHX1324

## 2019-08-20 LAB — PROTIME-INR
INR: 1.5 — ABNORMAL HIGH (ref 0.8–1.2)
Prothrombin Time: 17.9 seconds — ABNORMAL HIGH (ref 11.4–15.2)

## 2019-08-20 LAB — CBC
HCT: 28.7 % — ABNORMAL LOW (ref 39.0–52.0)
Hemoglobin: 9.1 g/dL — ABNORMAL LOW (ref 13.0–17.0)
MCH: 32.9 pg (ref 26.0–34.0)
MCHC: 31.7 g/dL (ref 30.0–36.0)
MCV: 103.6 fL — ABNORMAL HIGH (ref 80.0–100.0)
Platelets: 205 10*3/uL (ref 150–400)
RBC: 2.77 MIL/uL — ABNORMAL LOW (ref 4.22–5.81)
RDW: 15.9 % — ABNORMAL HIGH (ref 11.5–15.5)
WBC: 13.7 10*3/uL — ABNORMAL HIGH (ref 4.0–10.5)
nRBC: 0.2 % (ref 0.0–0.2)

## 2019-08-20 LAB — RENAL FUNCTION PANEL
Albumin: 2.2 g/dL — ABNORMAL LOW (ref 3.5–5.0)
Anion gap: 14 (ref 5–15)
BUN: 37 mg/dL — ABNORMAL HIGH (ref 8–23)
CO2: 23 mmol/L (ref 22–32)
Calcium: 8.5 mg/dL — ABNORMAL LOW (ref 8.9–10.3)
Chloride: 101 mmol/L (ref 98–111)
Creatinine, Ser: 8.07 mg/dL — ABNORMAL HIGH (ref 0.61–1.24)
GFR calc Af Amer: 7 mL/min — ABNORMAL LOW (ref >60)
GFR calc non Af Amer: 6 mL/min — ABNORMAL LOW (ref >60)
Glucose, Bld: 144 mg/dL — ABNORMAL HIGH (ref 70–99)
Phosphorus: 5.8 mg/dL — ABNORMAL HIGH (ref 2.5–4.6)
Potassium: 3.9 mmol/L (ref 3.5–5.1)
Sodium: 138 mmol/L (ref 135–145)

## 2019-08-20 LAB — MAGNESIUM: Magnesium: 2.2 mg/dL (ref 1.7–2.4)

## 2019-08-20 LAB — GLUCOSE, CAPILLARY
Glucose-Capillary: 148 mg/dL — ABNORMAL HIGH (ref 70–99)
Glucose-Capillary: 137 mg/dL — ABNORMAL HIGH (ref 70–99)
Glucose-Capillary: 144 mg/dL — ABNORMAL HIGH (ref 70–99)
Glucose-Capillary: 182 mg/dL — ABNORMAL HIGH (ref 70–99)

## 2019-08-20 LAB — HEPARIN LEVEL (UNFRACTIONATED): Heparin Unfractionated: 0.4 IU/mL (ref 0.30–0.70)

## 2019-08-20 SURGERY — ARTERIOVENOUS (AV) FISTULA CREATION
Anesthesia: General | Site: Neck | Laterality: Right

## 2019-08-20 MED ORDER — FENTANYL CITRATE (PF) 250 MCG/5ML IJ SOLN
INTRAMUSCULAR | Status: AC
  Filled 2019-08-20: qty 5

## 2019-08-20 MED ORDER — FENTANYL CITRATE (PF) 100 MCG/2ML IJ SOLN: 25.0000 ug | INTRAMUSCULAR | Status: DC | PRN

## 2019-08-20 MED ORDER — LACTATED RINGERS IV SOLN
INTRAVENOUS | Status: DC
  Administered 2019-08-20: 09:00:00 via INTRAVENOUS

## 2019-08-20 MED ORDER — ALBUMIN HUMAN 5 % IV SOLN
INTRAVENOUS | Status: AC
  Filled 2019-08-20: qty 250

## 2019-08-20 MED ORDER — ONDANSETRON HCL 4 MG/2ML IJ SOLN: 4.0000 mg | Freq: Once | INTRAMUSCULAR | Status: DC | PRN

## 2019-08-20 MED ORDER — ALBUMIN HUMAN 5 % IV SOLN
12.5000 g | Freq: Once | INTRAVENOUS | Status: AC
  Administered 2019-08-20: 15:00:00 12.5 g via INTRAVENOUS

## 2019-08-20 MED ORDER — PHENYLEPHRINE HCL-NACL 10-0.9 MG/250ML-% IV SOLN
INTRAVENOUS | Status: DC | PRN
  Administered 2019-08-20: 40 ug/min via INTRAVENOUS

## 2019-08-20 MED ORDER — PROPOFOL 10 MG/ML IV BOLUS
INTRAVENOUS | Status: AC
  Filled 2019-08-20: qty 40

## 2019-08-20 MED ORDER — ACETAMINOPHEN 10 MG/ML IV SOLN: 1000.0000 mg | Freq: Once | INTRAVENOUS | Status: DC | PRN

## 2019-08-20 MED ORDER — HEPARIN SODIUM (PORCINE) 1000 UNIT/ML IJ SOLN
INTRAMUSCULAR | Status: AC
  Filled 2019-08-20: qty 1

## 2019-08-20 MED ORDER — SODIUM CHLORIDE 0.9 % IV SOLN
INTRAVENOUS | Status: DC
  Administered 2019-08-20: 12:00:00 via INTRAVENOUS

## 2019-08-20 MED ORDER — LIDOCAINE 2% (20 MG/ML) 5 ML SYRINGE
INTRAMUSCULAR | Status: AC
  Filled 2019-08-20: qty 5

## 2019-08-20 MED ORDER — PROTAMINE SULFATE 10 MG/ML IV SOLN
INTRAVENOUS | Status: AC
  Filled 2019-08-20: qty 25

## 2019-08-20 MED ORDER — MIDAZOLAM HCL 2 MG/2ML IJ SOLN
INTRAMUSCULAR | Status: AC
  Filled 2019-08-20: qty 2

## 2019-08-20 MED ORDER — FENTANYL CITRATE (PF) 100 MCG/2ML IJ SOLN
INTRAMUSCULAR | Status: DC | PRN
  Administered 2019-08-20: 50 ug via INTRAVENOUS
  Administered 2019-08-20: 25 ug via INTRAVENOUS
  Administered 2019-08-20: 50 ug via INTRAVENOUS
  Administered 2019-08-20: 25 ug via INTRAVENOUS
  Administered 2019-08-20 (×2): 50 ug via INTRAVENOUS

## 2019-08-20 MED ORDER — PROPOFOL 10 MG/ML IV BOLUS
INTRAVENOUS | Status: AC
  Filled 2019-08-20: qty 20

## 2019-08-20 MED ORDER — PROPOFOL 10 MG/ML IV BOLUS
INTRAVENOUS | Status: DC | PRN
  Administered 2019-08-20: 120 mg via INTRAVENOUS

## 2019-08-20 MED ORDER — SODIUM CHLORIDE 0.9 % IV BOLUS
500.0000 mL | Freq: Once | INTRAVENOUS | Status: AC
  Administered 2019-08-20: 500 mL via INTRAVENOUS

## 2019-08-20 MED ORDER — SODIUM CHLORIDE 0.9 % IV SOLN
INTRAVENOUS | Status: AC
  Filled 2019-08-20: qty 1.2

## 2019-08-20 MED ORDER — LIDOCAINE-EPINEPHRINE 0.5 %-1:200000 IJ SOLN
INTRAMUSCULAR | Status: DC | PRN
  Administered 2019-08-20: 50 mL

## 2019-08-20 MED ORDER — PHENYLEPHRINE 40 MCG/ML (10ML) SYRINGE FOR IV PUSH (FOR BLOOD PRESSURE SUPPORT)
PREFILLED_SYRINGE | INTRAVENOUS | Status: DC | PRN
  Administered 2019-08-20: 160 ug via INTRAVENOUS

## 2019-08-20 MED ORDER — CEFAZOLIN SODIUM 1 G IJ SOLR
INTRAMUSCULAR | Status: AC
  Filled 2019-08-20: qty 20

## 2019-08-20 MED ORDER — LIDOCAINE-EPINEPHRINE 0.5 %-1:200000 IJ SOLN
INTRAMUSCULAR | Status: AC
  Filled 2019-08-20: qty 1

## 2019-08-20 MED ORDER — SODIUM CHLORIDE 0.9 % IV SOLN
INTRAVENOUS | Status: DC | PRN
  Administered 2019-08-20: 500 mL

## 2019-08-20 MED ORDER — LIDOCAINE 2% (20 MG/ML) 5 ML SYRINGE
INTRAMUSCULAR | Status: DC | PRN
  Administered 2019-08-20: 100 mg via INTRAVENOUS

## 2019-08-20 MED ORDER — 0.9 % SODIUM CHLORIDE (POUR BTL) OPTIME
TOPICAL | Status: DC | PRN
  Administered 2019-08-20: 1000 mL

## 2019-08-20 MED ORDER — CEFAZOLIN SODIUM-DEXTROSE 2-4 GM/100ML-% IV SOLN
INTRAVENOUS | Status: AC
  Filled 2019-08-20: qty 100

## 2019-08-20 MED ORDER — ONDANSETRON HCL 4 MG/2ML IJ SOLN
INTRAMUSCULAR | Status: AC
  Filled 2019-08-20: qty 2

## 2019-08-20 MED ORDER — HEPARIN SODIUM (PORCINE) 1000 UNIT/ML IJ SOLN
INTRAMUSCULAR | Status: DC | PRN
  Administered 2019-08-20: 5 [IU] via INTRAVENOUS

## 2019-08-20 MED ORDER — DEXAMETHASONE SODIUM PHOSPHATE 10 MG/ML IJ SOLN
INTRAMUSCULAR | Status: AC
  Filled 2019-08-20: qty 1

## 2019-08-20 MED ORDER — PHENYLEPHRINE HCL-NACL 10-0.9 MG/250ML-% IV SOLN: INTRAVENOUS | Status: DC | PRN

## 2019-08-20 SURGICAL SUPPLY — 55 items
ARMBAND PINK RESTRICT EXTREMIT (MISCELLANEOUS) ×6 IMPLANT
BAG DECANTER FOR FLEXI CONT (MISCELLANEOUS) ×3 IMPLANT
BIOPATCH RED 1 DISK 7.0 (GAUZE/BANDAGES/DRESSINGS) ×3 IMPLANT
CANISTER SUCT 3000ML PPV (MISCELLANEOUS) ×3 IMPLANT
CANNULA VESSEL 3MM 2 BLNT TIP (CANNULA) ×3 IMPLANT
CATH PALINDROME RT-P 15FX19CM (CATHETERS) IMPLANT
CATH PALINDROME RT-P 15FX23CM (CATHETERS) IMPLANT
CATH PALINDROME RT-P 15FX28CM (CATHETERS) ×1 IMPLANT
CATH PALINDROME RT-P 15FX55CM (CATHETERS) IMPLANT
CLIP LIGATING EXTRA MED SLVR (CLIP) ×3 IMPLANT
CLIP LIGATING EXTRA SM BLUE (MISCELLANEOUS) ×3 IMPLANT
COVER PROBE W GEL 5X96 (DRAPES) ×3 IMPLANT
COVER SURGICAL LIGHT HANDLE (MISCELLANEOUS) ×4 IMPLANT
COVER WAND RF STERILE (DRAPES) ×3 IMPLANT
DECANTER SPIKE VIAL GLASS SM (MISCELLANEOUS) ×3 IMPLANT
DERMABOND ADVANCED (GAUZE/BANDAGES/DRESSINGS) ×2
DERMABOND ADVANCED .7 DNX12 (GAUZE/BANDAGES/DRESSINGS) ×2 IMPLANT
DRAPE C-ARM 42X72 X-RAY (DRAPES) ×3 IMPLANT
DRAPE CHEST BREAST 15X10 FENES (DRAPES) ×3 IMPLANT
DRSG COVADERM 4X14 (GAUZE/BANDAGES/DRESSINGS) ×1 IMPLANT
ELECT REM PT RETURN 9FT ADLT (ELECTROSURGICAL) ×3
ELECTRODE REM PT RTRN 9FT ADLT (ELECTROSURGICAL) ×2 IMPLANT
GAUZE 4X4 16PLY RFD (DISPOSABLE) ×3 IMPLANT
GAUZE SPONGE 4X4 12PLY STRL (GAUZE/BANDAGES/DRESSINGS) ×2 IMPLANT
GLOVE ECLIPSE 6.5 STRL STRAW (GLOVE) ×1 IMPLANT
GLOVE SS BIOGEL STRL SZ 7.5 (GLOVE) ×2 IMPLANT
GLOVE SUPERSENSE BIOGEL SZ 7.5 (GLOVE) ×1
GOWN STRL NON-REIN LRG LVL3 (GOWN DISPOSABLE) ×1 IMPLANT
GOWN STRL REUS W/ TWL LRG LVL3 (GOWN DISPOSABLE) ×6 IMPLANT
GOWN STRL REUS W/TWL LRG LVL3 (GOWN DISPOSABLE) ×3
KIT BASIN OR (CUSTOM PROCEDURE TRAY) ×3 IMPLANT
KIT TURNOVER KIT B (KITS) ×3 IMPLANT
NDL 18GX1X1/2 (RX/OR ONLY) (NEEDLE) ×2 IMPLANT
NDL HYPO 25GX1X1/2 BEV (NEEDLE) ×2 IMPLANT
NEEDLE 18GX1X1/2 (RX/OR ONLY) (NEEDLE) ×3 IMPLANT
NEEDLE 22X1 1/2 (OR ONLY) (NEEDLE) IMPLANT
NEEDLE HYPO 25GX1X1/2 BEV (NEEDLE) ×3 IMPLANT
NS IRRIG 1000ML POUR BTL (IV SOLUTION) ×3 IMPLANT
PACK CV ACCESS (CUSTOM PROCEDURE TRAY) ×3 IMPLANT
PACK SURGICAL SETUP 50X90 (CUSTOM PROCEDURE TRAY) ×3 IMPLANT
PAD ARMBOARD 7.5X6 YLW CONV (MISCELLANEOUS) ×6 IMPLANT
SOAP 2 % CHG 4 OZ (WOUND CARE) ×3 IMPLANT
SUT ETHILON 3 0 PS 1 (SUTURE) ×3 IMPLANT
SUT PROLENE 6 0 CC (SUTURE) ×4 IMPLANT
SUT VIC AB 3-0 SH 27 (SUTURE) ×1
SUT VIC AB 3-0 SH 27X BRD (SUTURE) ×2 IMPLANT
SUT VICRYL 4-0 PS2 18IN ABS (SUTURE) ×4 IMPLANT
SYR 10ML LL (SYRINGE) ×3 IMPLANT
SYR 20ML LL LF (SYRINGE) ×3 IMPLANT
SYR 5ML LL (SYRINGE) ×6 IMPLANT
SYR CONTROL 10ML LL (SYRINGE) ×3 IMPLANT
TOWEL GREEN STERILE (TOWEL DISPOSABLE) ×6 IMPLANT
TOWEL GREEN STERILE FF (TOWEL DISPOSABLE) ×3 IMPLANT
UNDERPAD 30X30 (UNDERPADS AND DIAPERS) ×3 IMPLANT
WATER STERILE IRR 1000ML POUR (IV SOLUTION) ×3 IMPLANT

## 2019-08-20 NOTE — Anesthesia Procedure Notes (Signed)
Procedure Name: LMA Insertion Date/Time: 08/20/2019 1:26 PM Performed by: Janace Litten, CRNA Pre-anesthesia Checklist: Patient identified, Emergency Drugs available, Suction available and Patient being monitored Patient Re-evaluated:Patient Re-evaluated prior to induction Oxygen Delivery Method: Circle System Utilized Preoxygenation: Pre-oxygenation with 100% oxygen Induction Type: IV induction Ventilation: Mask ventilation without difficulty LMA: LMA inserted LMA Size: 4.0 Number of attempts: 1 Placement Confirmation: positive ETCO2 Tube secured with: Tape Dental Injury: Teeth and Oropharynx as per pre-operative assessment

## 2019-08-20 NOTE — Progress Notes (Signed)
ANTICOAGULATION CONSULT NOTE - Follow Up Consult  Pharmacy Consult for Heparin  Indication: atrial fibrillation (CHADS2VASc = 7)  Allergies  Allergen Reactions  . Ace Inhibitors Other (See Comments)    Reaction:  Raises potassium   . Quinapril Rash and Other (See Comments)    hyperkalemia    Patient Measurements: Height: 6\' 2"  (188 cm) Weight: 194 lb 7.1 oz (88.2 kg) IBW/kg (Calculated) : 82.2 Heparin Dosing Weight: 90.7 kg  Vital Signs: Temp: 97.6 F (36.4 C) (11/05 2352) Temp Source: Oral (11/05 2352) BP: 129/53 (11/05 2352) Pulse Rate: 67 (11/05 2352)  Labs: Recent Labs    08/17/19 0324 08/18/19 0433 08/18/19 0434 08/19/19 0500 08/19/19 2144  HGB 8.9*  --   --   --   --   HCT 27.2*  --   --   --   --   PLT 146*  --   --   --   --   LABPROT 26.3*  --  23.5* 22.2*  --   INR 2.5*  --  2.1* 2.0*  --   HEPARINUNFRC  --   --   --   --  0.14*  CREATININE 5.66* 8.07*  --  5.93*  --     Estimated Creatinine Clearance: 11.7 mL/min (A) (by C-G formula based on SCr of 5.93 mg/dL (H)).   Assessment: 78 yo M started on heparin for NSTEMI, s/p PCI with DES to SVG-RCA on 10/23 and continued for Afib (CHADS2VASC = 7). No anticoagulation prior to admission. Warfarin was started 10/29, then held starting 11/2 for AV Fistula placement (goal INR <1.7 for OR). Continue to hold warfarin until cleared by vascular to restart after AV fistula placement occurs.   Pharmacy consulted to dose heparin when INR<2. INR trend down a bit to 2.0 today. Vascular ordered vitamin K 5mg  PO x 1 today with plans for OR tomorrow for AVF placement. Will start heparin at 1400 today, as anticipate INR will be below 2 by that time. Previously therapeutic this admit on heparin rate 850 units/hr. CBC low but stable. No active bleed issues reported.  11/6 AM update:  Heparin level sub-therapeutic  No issues per RN   Goal of Therapy:  Heparin level 0.3-0.7 units/ml Monitor platelets by anticoagulation  protocol: Yes  Plan:  Inc heparin to 1050 units/hr Re-check heparin level in 8 hours Warfarin on hold for planned [place], PharmD, [place] Pharmacist Phone: [phone]

## 2019-08-20 NOTE — Progress Notes (Signed)
Talbot Grumbling, RN called and notified that the patient's HD tx has been moved to 08/21/19.

## 2019-08-20 NOTE — Progress Notes (Signed)
Patient's surgery delayed due to an emergency in the OR. Surgeon requested patient return to floor and resume heparin gtt as previously ordered. Margreta Journey, RN made aware of delay and heparin gtt.

## 2019-08-20 NOTE — Progress Notes (Addendum)
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  Transferred to [facility] 08/09/2019.  Acute on chronic kidney injury sustained requiring CRRT 08/09/2019-08/12/2019.    PT Comments    Patient not progressing with goals today secondary to symptomatic orthostatic hypotension. Requires Min A to stand from elevated bed height but only able to stand for less than 10 seconds prior to feeling like he was going to pass out, + pallor. Pt understandably frustrated with lack of progress. Maybe we can try some ted hose or ace wraps to see if this helps with BP drop? Plans to go to OR today for fistula for dialysis. Will continue to follow and progress as tolerated. Likely will not be able to progress unless BP becomes stabilized.  Supine BP 123/45 Sitting BP 101/48 Standing BP 84/47 symptomatic for all changes in position   Follow Up Recommendations  CIR;Supervision/Assistance - 24 hour     Equipment Recommendations  Rolling walker with 5" wheels;3in1 (PT)    Recommendations for Other Services       Precautions / Restrictions Precautions Precautions: Fall Precaution Comments: orthostatic Restrictions Weight Bearing Restrictions: No    Mobility  Bed Mobility Overal bed mobility: Needs Assistance Bed Mobility: Supine to Sit;Sit to Supine     Supine to sit: Supervision;HOB elevated Sit to supine: Supervision;HOB elevated   General bed mobility comments: Able to get to EOB and return to supine with use of rail, + dizziness once EOB.  Transfers Overall transfer level: Needs assistance Equipment used:  Rolling walker (2 wheeled) Transfers: Sit to/from Stand Sit to Stand: Min assist;From elevated surface         General transfer comment: Assist to power to standing from elevated bed height with cues for hand placement. + dizziness once up, pallor, resulting in needing to sit after 10 seconds. "I am going down."  Ambulation/Gait             General Gait Details: Deferred due to orthostatic hypotension and pt about to go down for procedure.   Stairs             Wheelchair Mobility    Modified Rankin (Stroke Patients Only)       Balance Overall balance assessment: Needs assistance Sitting-balance support: Feet supported;No upper extremity supported Sitting balance-Leahy Scale: Fair Sitting balance - Comments: pt sits with BUE support   Standing balance support: During functional activity Standing balance-Leahy Scale: Poor Standing balance comment: Requires BUe support as well as A esp when he becomes dizzy.                            Cognition Arousal/Alertness: Awake/alert Behavior During Therapy: WFL for tasks assessed/performed Overall Cognitive Status: No family/caregiver present to determine baseline cognitive functioning                                 General Comments: Confused about medical situation. Frustrated that he has gotten worse since admission.      Exercises      General Comments General comments (skin integrity, edema, etc.):  Supine BP 123/45, HR 82 bpm  Sitting BP 101/48, HR 88 bpm   Standing BP 84/47      Pertinent Vitals/Pain Pain Assessment: No/denies pain    Home Living                      Prior Function            PT Goals (current goals can now be found in the care plan section) Progress towards PT goals: Not progressing toward goals - comment(due to BP)    Frequency    Min 3X/week      PT Plan Current plan remains appropriate    Co-evaluation              AM-PAC PT  "6 Clicks" Mobility   Outcome Measure  Help needed turning from your back to your side while in a flat bed without using bedrails?: None Help needed moving from lying on your back to sitting on the side of a flat bed without using bedrails?: A Little Help needed moving to and from a bed to a chair (including a wheelchair)?: A Little Help needed standing up from a chair using your arms (e.g., wheelchair or bedside chair)?: A Little Help needed to walk in hospital room?: A Lot Help needed climbing 3-5 steps with a railing? : Total 6 Click Score: 16    End of Session Equipment Utilized During Treatment: Gait belt Activity Tolerance: Treatment limited secondary to medical complications (Comment)(BP) Patient left: in bed;with call bell/phone within reach;with bed alarm set Nurse Communication: Mobility status PT Visit Diagnosis: Muscle weakness (generalized) (M62.81);Difficulty in walking, not elsewhere classified (R26.2)     Time: 3403-5248 PT Time Calculation (min) (ACUTE ONLY): 22 min  Charges:  $Therapeutic Activity: 8-22 mins                     Marisa Severin, PT, DPT Acute Rehabilitation Services Pager [phone] Office [phone]       [name] 08/20/2019, 9:14 AM

## 2019-08-20 NOTE — Progress Notes (Signed)
Patient arrived to short stay surgical with heparin gtt running. Floor RN stated no orders to stop drip prior to surgery. Surgeon made aware of gtt and ordered to stop at this time. Order received and carried out.

## 2019-08-20 NOTE — Progress Notes (Signed)
Progress Note  Patient Name: [name] Date of Encounter: 08/20/2019  Primary Cardiologist: James Skains, MD   Subjective   [name] reported that he was not feeling well today, continues to endorse some shortness of breath.  He did stand up and sat in a chair for about 3 hours yesterday, however is unsure if he became dizzy when this he denied any chest pain or palpitations today.  Inpatient Medications    Scheduled Meds: . aspirin EC  81 mg Oral Daily  . atorvastatin  80 mg Oral q1800  . Chlorhexidine Gluconate Cloth  6 each Topical Daily  . Chlorhexidine Gluconate Cloth  6 each Topical Q0600  . Chlorhexidine Gluconate Cloth  6 each Topical Q0600  . clopidogrel  75 mg Oral Daily  . darbepoetin (ARANESP) injection - DIALYSIS  100 mcg Intravenous Q Wed-HD  . feeding supplement (NEPRO CARB STEADY)  237 mL Oral TID BM  . insulin aspart  0-15 Units Subcutaneous TID WC  . insulin aspart  0-5 Units Subcutaneous QHS  . insulin glargine  15 Units Subcutaneous BID  . levothyroxine  300 mcg Oral Q0600  . midodrine  5 mg Oral TID WC  . multivitamin  1 tablet Oral QHS  . pantoprazole  40 mg Oral Daily  . polyethylene glycol  17 g Oral BID  . sodium chloride flush  10-40 mL Intracatheter Q12H   Continuous Infusions: .  ceFAZolin (ANCEF) IV    . ferric gluconate (FERRLECIT/NULECIT) IV Stopped (08/18/19 1709)  . heparin 1,050 Units/hr (08/20/19 0014)   PRN Meds: acetaminophen, albuterol, food thickener, guaiFENesin, heparin, loperamide, nitroGLYCERIN, ondansetron (ZOFRAN) IV, Resource ThickenUp Clear, sodium chloride, sodium chloride flush   Vital Signs    Vitals:   08/19/19 2352 08/20/19 0456 08/20/19 0500 08/20/19 0508  BP: (!) 129/53  (!) 70/53 120/61  Pulse: 67 78 86 69  Resp: (!) 22  18 20   Temp: 97.6 F (36.4 C) 98.1 F (36.7 C)    TempSrc: Oral Oral    SpO2: 94% 94% 93% 92%  Weight:  89.6 kg    Height:        Intake/Output Summary (Last 24 hours) at  08/20/2019 1962 Last data filed at 08/20/2019 0500 Gross per 24 hour  Intake -  Output 125 ml  Net -125 ml   Filed Weights   08/18/19 1714 08/19/19 0420 08/20/19 0456  Weight: 87.3 kg 88.2 kg 89.6 kg    Telemetry    Atrial fibrillation/flutter, occasional PVCs, nonsustained V. tach- Personally Reviewed  ECG    None today  Physical Exam   GEN: Elderly male, NAD, tired appearing Neck: R IJ in place Cardiac: Irregular rhythm, no murmurs, rubs, or gallops.  Respiratory: Clear to auscultation bilaterally. GI: Soft, nontender, non-distended  MS: No edema; No deformity. Neuro:  Nonfocal  Psych: Normal affect   Labs    Chemistry Recent Labs  Lab 08/18/19 0433 08/19/19 0500 08/20/19 0242  NA 137 138 138  K 3.9 4.1 3.9  CL 100 102 101  CO2 24 25 23   GLUCOSE 177* 219* 144*  BUN 50* 28* 37*  CREATININE 8.07* 5.93* 8.07*  CALCIUM 8.3* 8.2* 8.5*  ALBUMIN 2.1* 2.1* 2.2*  GFRNONAA 6* 8* 6*  GFRAA 7* 10* 7*  ANIONGAP 13 11 14      Hematology Recent Labs  Lab 08/16/19 0711 08/17/19 0324 08/20/19 0242  WBC 12.2* 12.9* 13.7*  RBC 2.67* 2.71* 2.77*  HGB 8.7* 8.9* 9.1*  HCT  26.9* 27.2* 28.7*  MCV 100.7* 100.4* 103.6*  MCH 32.6 32.8 32.9  MCHC 32.3 32.7 31.7  RDW 15.9* 15.9* 15.9*  PLT 140* 146* 205    Cardiac EnzymesNo results for input(s): TROPONINI in the last 168 hours. No results for input(s): TROPIPOC in the last 168 hours.   BNPNo results for input(s): BNP, PROBNP in the last 168 hours.   DDimer No results for input(s): DDIMER in the last 168 hours.   Radiology    Vas [place] Upper Extremity Arterial Duplex  Result Date: 08/18/2019 UPPER EXTREMITY DUPLEX STUDY Indications: Doctor complains of weak radial arterial pulse felt. History:     Patient states he had an "A line" in left distal forearm.  Risk Factors:  Hypertension, hyperlipidemia, Diabetes, past history of smoking,                coronary artery disease, prior CVA. Other Factors: Patient has end stage  4 chronic renal disease. Performing Technologist: [name] : RVT, RDCS (AE), RDMS  Examination Guidelines: A complete evaluation includes B-mode imaging, spectral Doppler, color Doppler, and power Doppler as needed of all accessible portions of each vessel. Bilateral testing is considered an integral part of a complete examination. Limited examinations for reoccurring indications may be performed as noted.  Left Doppler Findings: +--------+----------+---------+------+--------+ Site    PSV (cm/s)Waveform PlaqueComments +--------+----------+---------+------+--------+ [id number]       biphasic                +--------+----------+---------+------+--------+ Radial  154       triphasic               +--------+----------+---------+------+--------+ Ulnar   135       triphasic               +--------+----------+---------+------+--------+ Left diameters brachial proximal .66 cm, mid .62 cm, distal .63 cm. Radial artery diameters proximal .42cm, mid .29 cm, distal .24cm and .23 cm. Ulnar artery diameters proximal .31 cm, mid .37cm, distal .29cm.  Left Pre-Dialysis Findings: +-----------------------+----------+--------------------+---------+--------+ Location               PSV (cm/s)Intralum. Diam. (cm)Waveform Comments +-----------------------+----------+--------------------+---------+--------+ Brachial Antecub. fossa117       0.64                triphasic         +-----------------------+----------+--------------------+---------+--------+ Radial Art at Wrist    173       0.23                triphasic         +-----------------------+----------+--------------------+---------+--------+ Ulnar Art at Wrist     169       0.29                triphasic         +-----------------------+----------+--------------------+---------+--------+  Summary:  Left: No obstruction visualized in the left upper extremity There       are calcified walls noted in the arteries of the forearm. *See  table(s) above for measurements and observations. Electronically signed by James Jews MD on 08/18/2019 at 6:26:03 PM.    Final     Cardiac Studies   Cardiac Catheterization and Percutaneous Coronary Intervention10.23.2020  Conclusions: 1. See diagnostic angiogram by [name]. Nehemiah Ruiz for details of coronary/graft anatomy. 2. Severe disease involving SVG-RCA with 80-90% stenosis. 3. Successful PCI to SVG-RCA using Resolute Onyx 4.0 x 38 mm drug-eluting stent with 10% residual stenosis and TIMI-3 flow. 4. Focal perforation of the SVG-RCA  during stent deployment. This was successfully contained with internal tamponade.  Recommendations: 1. Dual antiplatelet therapy with aspirin and ticagrelor for at least 12 months. 2. Aggressive secondary prevention. 3. Transfer to [facility] for ICU monitoring, given focal perforation of SVG-RCA. If the patient were to become hemodynamically unstable or develop worsening anemia, repeat angiography of SVG-RCA and possible covered stent placement would need to be considered. _____________  2D Echocardiogram10.24.2020 IMPRESSIONS  1. Left ventricular ejection fraction, by visual estimation, is 60 to 65%. The left ventricle has normal function. Normal left ventricular size. There is no left ventricular hypertrophy. 2. Global right ventricle has normal systolic function.The right ventricular size is normal. No increase in right ventricular wall thickness. 3. Left atrial size was normal. 4. Right atrial size was norma 5. Severe mitral annular calcification. Mild calcification of the anterior mitral valve leaflet(s).. No evidence of mitral valve regurgitation. Mild mitral stenosis. 6. The tricuspid valve is normal in structure. Tricuspid valve regurgitation is mild. 7. The aortic valve is tricuspid Aortic valve regurgitation is mild by color flow Doppler. Moderate aortic valve sclerosis/calcification without any evidence of aortic stenosis.  8. The pulmonic valve was normal in structure. Pulmonic valve regurgitation is not visualized by color flow Doppler. 9. Normal pulmonary artery systolic pressure. 10. The inferior vena cava is normal in size with greater than 50% respiratory variability, suggesting right atrial pressure of 3 mmHg. 11. No evidence of pericardial effusion. _____________   Patient Profile     77 y.o. male with a history of CAD s/p CABG, DM, HTN, HLD, CKD stage 4 who presented with an NSTEMI s/p cardiac catheterizaion complicated by perforated SVG to RCA graft who was transferred from [facility] also with CKD stage 5 requiring iHD.    Assessment & Plan    1. NSTEMI/CAD: S/p cardiac craterization complicated by perforation SVG to RCA, contained with internal tamponade.  Denies any current chest pain or palpitation.  Currently on aspirin and Plavix, holding his Coumadin at this time with plans to go to surgery today.  Will need to resume after surgery. Continue ASA for 30 days post stent, 09/06/19, then decrease to plavix 75 mg daily and coumadin only for at least 1 year.  -Continue ASA and Plavix -Hold coumadin for now, INR 1.5 today  2. CKD stage 5 with intermittent HD: Currently has RIJ in place. Nephrology following. S/p dialysis 08/16/19. Will need TDC and access placed when INR <1.7. Holding coumadin. Received vitamin for INR reversal yesterday. INR today is 1.5.  -Continue to hold coumadin -TDC and access today per vascular  New onset atrial fibrillation/flutter: CHADs-VASc score of 6, unclear etiology at this time. Remains in atrial flutter on telemetry. Not on any AV nodal blocking agents. Currently on triple therapy, holding coumadin as above.  Had some epistaxis however this has stopped.   Orthostatic hypotension: Noted during hospital admission, was started on midodrine 2.5 mg 3 times daily with meals.  Increased midodrine yesterday to 5 mg TID. He is unsure if he was having any symptoms  yesterday.  -Recheck orthostatics  -Continue to monitor vitals For questions or updates, please contact [place] Please consult [email or web address] for contact info under Cardiology/STEMI.    Signed, Asencion Noble, MD  08/20/2019, 8:21 AM    I have seen and examined the patient along with Asencion Noble, MD.  I have reviewed the chart, notes and new data.  I agree with her note.  Key new complaints: hard to say if  the current midodrine dose is effective since he is going to surgery.  Key examination changes: no overt evidence of hypervolemia. Rate controlled atrial fib (without AV blocking agents). Note SBP in R upper extremity is 50 mm Hg or so lower than the SBP in the L upper extremity Key new findings / data: INR 1.5  PLAN: No post-PCI angina. On DAPT for 30 days, the clopidogrel for next 11 months. Resume warfarin (INR 2.0-3.0) for atrial fibrillation, after he has surgery. No claudication or vertebral steal sd. R upper extremity. Will need to have AV fistula on L. Will probably need to check BP in legs. Has not made progress with PT/rehab yet due to severe symptomatic hypotension. Recently titrated midodrine up to 5 mg TID. Surgery today for HD access. After surgery, plan to repeat a modified barium swallow to see if we can change diet to a consistency that he finds more palatable.  Sanda Klein, MD, [place] [phone] 08/20/2019, 10:26 AM

## 2019-08-20 NOTE — Progress Notes (Signed)
Patient has been accepted at [facility] on a TTS schedule with a seat time of 11:30am. He needs to arrive to his appointments 20 minutes early.  Nephrologist/Dr. Carolin Sicks and patient's daughter/wife notified. Navigator will meet with patient to notify also. Patient's disposition has not yet been determined as CIR has requested insurance authorization. Renal Navigator will continue to follow closely to assist with smooth transition from hospital to OP HD clinic start.  Alphonzo Cruise, [place] Renal Navigator [phone]

## 2019-08-20 NOTE — Progress Notes (Signed)
   08/20/19 1545  Vitals  BP (!) 50/39  MAP (mmHg) (!) 45  Pulse Rate 84  ECG Heart Rate 84  Resp 19  Oxygen Therapy  SpO2 (!) 89 %  MEWS Score  MEWS RR 0  MEWS Pulse 0  MEWS Systolic 3  MEWS LOC 0  MEWS Temp 0  MEWS Score 3  MEWS Score Color Yellow    Md called about low BP. Pt asymptomatic, awaiting new orders at this time.

## 2019-08-20 NOTE — Interval H&P Note (Signed)
History and Physical Interval Note:  08/20/2019 12:40 PM  [name]  has presented today for surgery, with the diagnosis of end stage renal disease.  The various methods of treatment have been discussed with the patient and family. After consideration of risks, benefits and other options for treatment, the patient has consented to  Procedure(s): ARTERIOVENOUS (AV) FISTULA  BRACHIOCEPHALIC CREATION (Left) INSERTION OF DIALYSIS CATHETER (N/A) as a surgical intervention.  The patient's history has been reviewed, patient examined, no change in status, stable for surgery.  I have reviewed the patient's chart and labs.  Questions were answered to the patient's satisfaction.     Curt Jews

## 2019-08-20 NOTE — Progress Notes (Signed)
ANTICOAGULATION CONSULT NOTE - Follow Up Consult  Pharmacy Consult for heparin Indication: atrial fibrillation (CHADS2VASc = 7)  Allergies  Allergen Reactions  . Ace Inhibitors Other (See Comments)    Reaction:  Raises potassium   . Quinapril Rash and Other (See Comments)    hyperkalemia    Patient Measurements: Height: 6\' 2"  (188 cm) Weight: 197 lb 8.5 oz (89.6 kg) IBW/kg (Calculated) : 82.2 Heparin Dosing Weight: 90.7 kg  Vital Signs: Temp: 97.7 F (36.5 C) (11/06 0847) Temp Source: Oral (11/06 0847) BP: 130/53 (11/06 0847) Pulse Rate: 83 (11/06 0847)  Labs: Recent Labs    08/18/19 0433 08/18/19 0434 08/19/19 0500 08/19/19 2144 08/20/19 0242 08/20/19 0802  HGB  --   --   --   --  9.1*  --   HCT  --   --   --   --  28.7*  --   PLT  --   --   --   --  205  --   LABPROT  --  23.5* 22.2*  --  17.9*  --   INR  --  2.1* 2.0*  --  1.5*  --   HEPARINUNFRC  --   --   --  0.14*  --  0.40  CREATININE 8.07*  --  5.93*  --  8.07*  --     Estimated Creatinine Clearance: 8.6 mL/min (A) (by C-G formula based on SCr of 8.07 mg/dL (H)).   Assessment: 78 yo M started on heparin for NSTEMI, s/p PCI with DES to SVG-RCA on 10/23 and continued for Afib (CHADS2VASC = 7). No anticoagulation prior to admission. Warfarin was started 10/29, then held starting 11/2 for AV Fistula placement (goal INR <1.7 for OR). Continue to hold warfarin until cleared by vascular to restart after AV fistula placement occurs.   Heparin started 11/5 when INR appropriate, trended down to 1.5 today after vitamin K 5mg  PO x 1 on 11/5. Heparin level now therapeutic after rate increase this AM. CBC stable. No active bleed issues reported.   Goal of Therapy:  Heparin level 0.3-0.7 units/ml Monitor platelets by anticoagulation protocol: Yes  Plan:  Continue heparin at 1050 units/hr Monitor daily heparin level and CBC, s/sx bleeding F/u resume warfarin as appropriate post-op 11/6   Elicia Lamp, PharmD,  BCPS Please check AMION for all [place] contact numbers Clinical Pharmacist 08/20/2019 8:57 AM

## 2019-08-20 NOTE — Consult Note (Signed)
[facility] CM Inpatient Consult   08/20/2019  [name] 01-Jan-1941 [id number]    Patient reviewed for LLOS (long length of stay), 7 day and 30 day readmissions, and forpotential need of [facility] care management services with 35%extreme high risk scorefor unplanned readmissionand hospitalization, under his [facility] plan.  Review of patient's medical record revealsthat patient is:   78 y.o. male with a history of CAD s/p CABG, DM, HTN, HLD, CKD stage 4,   who presented with an NSTEMI s/p cardiac catheterization complicated by perforated SVG to RCA graft who was transferred from [facility] also with CKD stage 5 requiring iHD.  Patient had right IJ tunneled hemodialysis catheter, left brachiocephalic AV fistula creation. [New onset atrial fibrillation/flutter, Orthostatic hypotension]  Brief chartreviewrevealsPT/ OTnotesrecommending patientfor CIR([facility]). Inpatient Rehab Admissions coordinator initiated authorization for possible CIR admit.  Patient has been accepted at [facility] on a TTS schedule with a seat time of 11:30am for HD.  Will followforprogress anddisposition, and ifthere are any changesin dispositionand needs for appropriate community follow-up,please referpatientto [facility] caremanagement.  Of note, [facility] Care Management services does not replace or interfere with any servicesarranged by transition of care CM or social work.   Forquestions and additional information, pleasecontact:  Jamelle Goldston A. Mantaj Chamberlin, BSN, RN-BC [facility] Liaison Cell: [phone]

## 2019-08-20 NOTE — Transfer of Care (Signed)
Immediate Anesthesia Transfer of Care Note  Patient: [name]  Procedure(s) Performed: ARTERIOVENOUS (AV) FISTULA  BRACHIOCEPHALIC CREATION (Left Arm Lower) INSERTION OF DIALYSIS CATHETER (Right Neck)  Patient Location: PACU  Anesthesia Type:General  Level of Consciousness: drowsy and patient cooperative  Airway & Oxygen Therapy: Patient Spontanous Breathing  Post-op Assessment: Report given to RN and Post -op Vital signs reviewed and stable  Post vital signs: Reviewed and stable  Last Vitals:  Vitals Value Taken Time  BP 107/46 08/20/19 1448  Temp    Pulse 96 08/20/19 1449  Resp 17 08/20/19 1449  SpO2 94 % 08/20/19 1449  Vitals shown include unvalidated device data.  Last Pain:  Vitals:   08/20/19 0847  TempSrc: Oral  PainSc:       Patients Stated Pain Goal: 0 (79/81/02 5486)  Complications: No apparent anesthesia complications

## 2019-08-20 NOTE — Op Note (Signed)
° ° °  OPERATIVE REPORT  DATE OF SURGERY: 08/20/2019  PATIENT: BEREKET GERNERT, 78 y.o. male MRN: [id number]  DOB: 01-08-1941  PRE-OPERATIVE DIAGNOSIS: End-stage renal disease  POST-OPERATIVE DIAGNOSIS:  Same  PROCEDURE: #1 right IJ tunneled hemodialysis catheter, #2 left brachiocephalic AV fistula creation  SURGEON:  Curt Jews, M.D.  PHYSICIAN ASSISTANT: Nurse  ANESTHESIA: LMA  EBL: per anesthesia record  Total I/O In: 770.1 [I.V.:770.1] Out: 60 [Urine:60]  BLOOD ADMINISTERED: none  DRAINS: none  SPECIMEN: none  COUNTS CORRECT:  YES  PATIENT DISPOSITION:  PACU - hemodynamically stable  PROCEDURE DETAILS: Patient was taken to room placed supine position with area of the right neck was prepped draped in usual sterile fashion.  The patient had an existing temporary catheter.  The catheter was prepped in the field as well.  A guidewire was passed through the existing catheter and the catheter was removed.  Fluoroscopy was used to confirm that this was in the right atrium.  The dilator and peel-away sheath were passed over the guidewire and the dilator and guidewire removed.  A 23 cm catheter was passed through the peel-away sheath and the sheath was removed.  Catheter was brought through subcutaneous tunnel through a separate stab incision and the 2 lm ports were attached.  Both lumens flushed and aspirated easily and were locked with 1000/cc heparin.  Catheter was secured to the skin with a 3-0 nylon stitch and the entry site was closed with 4 septic or Vicryl stitch.  Sterile dressing was applied.  Next tension was turned to the left arm.  Left arm prepped draped you sterile fashion.  SonoSite ultrasound was used to visualize the vein which was of excellent size at the antecubital space.  Patient had a 2+ ulnar pulse but did not have a radial pulse.  The incision was made over the antecubital space and carried down to isolate the cephalic vein at the antecubital space which was very  large.  Tributary branches were ligated with 3-0 and 4 silk ties and divided.  The vein was ligated distally and divided.  The brachial artery was exposed through the same incision and was of large size with minimal atherosclerotic change.  The artery was occluded proximally distally and was opened with 11 blade similarly with Potts scissors.  A small arteriotomy arteriotomy was created to reduce risk for steal.  The cephalic vein was cut to the appropriate length and was spatulated and sewn end-to-side to the artery with a running 6-0 Prolene suture.  Clamps removed and excellent thrill was noted.  The wounds irrigated with saline.  Hemostasis electrocautery.  Wounds were closed with 3-0 Vicryl in the subcutaneous septic tissue.  Sterile dressing was applied   Rosetta Posner, M.D., [facility] 08/20/2019 3:12 PM

## 2019-08-20 NOTE — Progress Notes (Signed)
[facility] NEPHROLOGY PROGRESS NOTE  Assessment/ Plan: Pt is a 78 y.o. yo male  W/ pmh of CAD s/p CABG, DM, HTN, HLD, CKD IV, who presented w/ NSTEMI, now s/p cardiac cath complicated by perforated SVG to RCA graft  # NSTEMI/CAD: s/p cardiac cath with perforation.  Cardiology following. Currently on plavix.   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  No obstruction on renal u/s. Required CRRT, which was stopped 10/29. On MWF HD schedule. Vascular surgery consulted for [facility] and AV fistula, they plan for L AV graft/fistula and tunneled HD cath today.  - renal navigator making arrangements for outpatient HD  #Hypotension: blood pressure improved. Continues to have normal range BP with occasional hypotensive episodes at night.   # new onset a fib/flutter: off beta blocker.  Coumadin being held for procedure.   #anemia: iron sat 20%. IV iron and ESA during dialysis.  Monitor CBC. Stable bw 9-10 g/dl.   # Secondary hyperparathyroidism: phosphorus normal today after HD yesterday. PTH elevated at 535, Ca 8.0, consistent with secondary hyperPTH. Controlled with HD.   Subjective:  Patient is glad that he will be able to get his cath placed today.  Feels okay, no major changes from previous days.   Objective General:NAD, comfortable, sitting in bed.  Heart:RRR, s1s2 nl Lungs:clear b/l, no crackle Abdomen:soft, Non-tender, non-distended Extremities:No edema Dialysis Access: right IJ temp cath  Vital signs in last 24 hours: Vitals:   08/19/19 2352 08/20/19 0456 08/20/19 0500 08/20/19 0508  BP: (!) 129/53  (!) 70/53 120/61  Pulse: 67 78 86 69  Resp: (!) 22  18 20   Temp: 97.6 F (36.4 C) 98.1 F (36.7 C)    TempSrc: Oral Oral    SpO2: 94% 94% 93% 92%  Weight:  89.6 kg    Height:       Weight change: 0.2 kg  Intake/Output Summary (Last 24 hours) at 08/20/2019 0715 Last data filed at 08/20/2019 0500 Gross per 24 hour  Intake -  Output  125 ml  Net -125 ml       Labs: Basic Metabolic Panel: Recent Labs  Lab 08/18/19 0433 08/19/19 0500 08/20/19 0242  NA 137 138 138  K 3.9 4.1 3.9  CL 100 102 101  CO2 24 25 23   GLUCOSE 177* 219* 144*  BUN 50* 28* 37*  CREATININE 8.07* 5.93* 8.07*  CALCIUM 8.3* 8.2* 8.5*  PHOS 6.0* 4.5 5.8*   Liver Function Tests: Recent Labs  Lab 08/18/19 0433 08/19/19 0500 08/20/19 0242  ALBUMIN 2.1* 2.1* 2.2*   No results for input(s): LIPASE, AMYLASE in the last 168 hours. No results for input(s): AMMONIA in the last 168 hours. CBC: Recent Labs  Lab 08/14/19 0244 08/15/19 0410 08/16/19 0711 08/17/19 0324 08/20/19 0242  WBC 11.9* 12.8* 12.2* 12.9* 13.7*  HGB 10.1* 9.2* 8.7* 8.9* 9.1*  HCT 30.5* 28.7* 26.9* 27.2* 28.7*  MCV 97.8 100.0 100.7* 100.4* 103.6*  PLT 109* 122* 140* 146* 205   Cardiac Enzymes: No results for input(s): CKTOTAL, CKMB, CKMBINDEX, TROPONINI in the last 168 hours. CBG: Recent Labs  Lab 08/19/19 0559 08/19/19 1205 08/19/19 1629 08/19/19 2126 08/20/19 0556  GLUCAP 202* 266* 176* 124* 148*    Iron Studies:  No results for input(s): IRON, TIBC, TRANSFERRIN, FERRITIN in the last 72 hours. Studies/Results: Vas [place] Upper Extremity Arterial Duplex  Result Date: 08/18/2019 UPPER EXTREMITY DUPLEX STUDY Indications: Doctor complains of weak radial arterial pulse felt. History:  Patient states he had an "A line" in left distal forearm.  Risk Factors:  Hypertension, hyperlipidemia, Diabetes, past history of smoking,                coronary artery disease, prior CVA. Other Factors: Patient has end stage 4 chronic renal disease. Performing Technologist: Estill Batten Mackin : RVT, RDCS (AE), RDMS  Examination Guidelines: A complete evaluation includes B-mode imaging, spectral Doppler, color Doppler, and power Doppler as needed of all accessible portions of each vessel. Bilateral testing is considered an integral part of a complete examination. Limited examinations  for reoccurring indications may be performed as noted.  Left Doppler Findings: +--------+----------+---------+------+--------+ Site    PSV (cm/s)Waveform PlaqueComments +--------+----------+---------+------+--------+ [id number]       biphasic                +--------+----------+---------+------+--------+ Radial  154       triphasic               +--------+----------+---------+------+--------+ Ulnar   135       triphasic               +--------+----------+---------+------+--------+ Left diameters brachial proximal .66 cm, mid .62 cm, distal .63 cm. Radial artery diameters proximal .42cm, mid .29 cm, distal .24cm and .23 cm. Ulnar artery diameters proximal .31 cm, mid .37cm, distal .29cm.  Left Pre-Dialysis Findings: +-----------------------+----------+--------------------+---------+--------+ Location               PSV (cm/s)Intralum. Diam. (cm)Waveform Comments +-----------------------+----------+--------------------+---------+--------+ Brachial Antecub. fossa117       0.64                triphasic         +-----------------------+----------+--------------------+---------+--------+ Radial Art at Wrist    173       0.23                triphasic         +-----------------------+----------+--------------------+---------+--------+ Ulnar Art at Wrist     169       0.29                triphasic         +-----------------------+----------+--------------------+---------+--------+  Summary:  Left: No obstruction visualized in the left upper extremity There       are calcified walls noted in the arteries of the forearm. *See table(s) above for measurements and observations. Electronically signed by Curt Jews MD on 08/18/2019 at 6:26:03 PM.    Final     Medications: Infusions: .  ceFAZolin (ANCEF) IV    . ferric gluconate (FERRLECIT/NULECIT) IV Stopped (08/18/19 1709)  . heparin 1,050 Units/hr (08/20/19 0014)    Scheduled Medications: . aspirin EC  81 mg Oral Daily  .  atorvastatin  80 mg Oral q1800  . Chlorhexidine Gluconate Cloth  6 each Topical Daily  . Chlorhexidine Gluconate Cloth  6 each Topical Q0600  . Chlorhexidine Gluconate Cloth  6 each Topical Q0600  . clopidogrel  75 mg Oral Daily  . darbepoetin (ARANESP) injection - DIALYSIS  100 mcg Intravenous Q Wed-HD  . feeding supplement (NEPRO CARB STEADY)  237 mL Oral TID BM  . insulin aspart  0-15 Units Subcutaneous TID WC  . insulin aspart  0-5 Units Subcutaneous QHS  . insulin glargine  15 Units Subcutaneous BID  . levothyroxine  300 mcg Oral Q0600  . midodrine  5 mg Oral TID WC  . multivitamin  1 tablet Oral QHS  . pantoprazole  40 mg Oral Daily  .  polyethylene glycol  17 g Oral BID  . sodium chloride flush  10-40 mL Intracatheter Q12H    have reviewed scheduled and prn medications.   Benay Pike 08/20/2019,7:15 AM  LOS: 14 days  Pager: [phone]

## 2019-08-20 NOTE — Anesthesia Postprocedure Evaluation (Signed)
Anesthesia Post Note  Patient: MAXMILIAN TROSTEL  Procedure(s) Performed: ARTERIOVENOUS (AV) FISTULA  BRACHIOCEPHALIC CREATION (Left Arm Lower) INSERTION OF DIALYSIS CATHETER (Right Neck)     Patient location during evaluation: PACU Anesthesia Type: General Level of consciousness: awake and alert Pain management: pain level controlled Vital Signs Assessment: post-procedure vital signs reviewed and stable Respiratory status: spontaneous breathing, nonlabored ventilation, respiratory function stable and patient connected to nasal cannula oxygen Cardiovascular status: blood pressure returned to baseline and stable Postop Assessment: no apparent nausea or vomiting Anesthetic complications: no    Last Vitals:  Vitals:   08/20/19 1518 08/20/19 1523  BP: (!) 116/48 100/68  Pulse: 80 86  Resp: 16 17  Temp:    SpO2: 94% 94%    Last Pain:  Vitals:   08/20/19 1518  TempSrc:   PainSc: 0-No pain                 Barnet Glasgow

## 2019-08-21 ENCOUNTER — Encounter (HOSPITAL_COMMUNITY): Payer: Self-pay | Admitting: Vascular Surgery

## 2019-08-21 DIAGNOSIS — N185 Chronic kidney disease, stage 5: Secondary | ICD-10-CM

## 2019-08-21 LAB — LACTIC ACID, PLASMA
Lactic Acid, Venous: 1 mmol/L (ref 0.5–1.9)
Lactic Acid, Venous: 1.2 mmol/L (ref 0.5–1.9)
Lactic Acid, Venous: 1.3 mmol/L (ref 0.5–1.9)

## 2019-08-21 LAB — RENAL FUNCTION PANEL
Albumin: 2.1 g/dL — ABNORMAL LOW (ref 3.5–5.0)
Anion gap: 13 (ref 5–15)
BUN: 45 mg/dL — ABNORMAL HIGH (ref 8–23)
CO2: 21 mmol/L — ABNORMAL LOW (ref 22–32)
Calcium: 7.8 mg/dL — ABNORMAL LOW (ref 8.9–10.3)
Chloride: 103 mmol/L (ref 98–111)
Creatinine, Ser: 8.99 mg/dL — ABNORMAL HIGH (ref 0.61–1.24)
GFR calc Af Amer: 6 mL/min — ABNORMAL LOW (ref >60)
GFR calc non Af Amer: 5 mL/min — ABNORMAL LOW (ref >60)
Glucose, Bld: 162 mg/dL — ABNORMAL HIGH (ref 70–99)
Phosphorus: 6.4 mg/dL — ABNORMAL HIGH (ref 2.5–4.6)
Potassium: 4.4 mmol/L (ref 3.5–5.1)
Sodium: 137 mmol/L (ref 135–145)

## 2019-08-21 LAB — GLUCOSE, CAPILLARY
Glucose-Capillary: 133 mg/dL — ABNORMAL HIGH (ref 70–99)
Glucose-Capillary: 222 mg/dL — ABNORMAL HIGH (ref 70–99)
Glucose-Capillary: 231 mg/dL — ABNORMAL HIGH (ref 70–99)
Glucose-Capillary: 166 mg/dL — ABNORMAL HIGH (ref 70–99)

## 2019-08-21 LAB — MAGNESIUM: Magnesium: 2.1 mg/dL (ref 1.7–2.4)

## 2019-08-21 LAB — PROTIME-INR
INR: 1.4 — ABNORMAL HIGH (ref 0.8–1.2)
Prothrombin Time: 16.7 seconds — ABNORMAL HIGH (ref 11.4–15.2)

## 2019-08-21 LAB — CBC
HCT: 27.2 % — ABNORMAL LOW (ref 39.0–52.0)
Hemoglobin: 8.3 g/dL — ABNORMAL LOW (ref 13.0–17.0)
MCH: 32.5 pg (ref 26.0–34.0)
MCHC: 30.5 g/dL (ref 30.0–36.0)
MCV: 106.7 fL — ABNORMAL HIGH (ref 80.0–100.0)
Platelets: 184 10*3/uL (ref 150–400)
RBC: 2.55 MIL/uL — ABNORMAL LOW (ref 4.22–5.81)
RDW: 16.1 % — ABNORMAL HIGH (ref 11.5–15.5)
WBC: 12.9 10*3/uL — ABNORMAL HIGH (ref 4.0–10.5)
nRBC: 0.4 % — ABNORMAL HIGH (ref 0.0–0.2)

## 2019-08-21 LAB — HEPARIN LEVEL (UNFRACTIONATED): Heparin Unfractionated: 0.3 IU/mL (ref 0.30–0.70)

## 2019-08-21 MED ORDER — MIDODRINE HCL 5 MG PO TABS
5.0000 mg | ORAL_TABLET | Freq: Once | ORAL | Status: AC
  Administered 2019-08-21: 23:00:00 5 mg via ORAL
  Filled 2019-08-21: qty 1

## 2019-08-21 MED ORDER — SODIUM CHLORIDE 0.9 % IV BOLUS
250.0000 mL | Freq: Once | INTRAVENOUS | Status: AC
  Administered 2019-08-21: 250 mL via INTRAVENOUS

## 2019-08-21 MED ORDER — SEVELAMER CARBONATE 800 MG PO TABS
800.0000 mg | ORAL_TABLET | Freq: Three times a day (TID) | ORAL | Status: DC
  Administered 2019-08-21 – 2019-08-26 (×9): 800 mg via ORAL
  Filled 2019-08-21 (×10): qty 1

## 2019-08-21 MED ORDER — WARFARIN - PHARMACIST DOSING INPATIENT
Freq: Every day | Status: DC
  Administered 2019-08-21 – 2019-08-25 (×3)

## 2019-08-21 MED ORDER — WARFARIN SODIUM 5 MG PO TABS
5.0000 mg | ORAL_TABLET | Freq: Once | ORAL | Status: AC
  Administered 2019-08-21: 18:00:00 5 mg via ORAL
  Filled 2019-08-21: qty 1

## 2019-08-21 MED ORDER — OXYCODONE HCL 5 MG PO TABS
5.0000 mg | ORAL_TABLET | ORAL | Status: AC
  Administered 2019-08-21: 5 mg via ORAL
  Filled 2019-08-21: qty 1

## 2019-08-21 MED ORDER — SODIUM CHLORIDE 0.9 % IV BOLUS
250.0000 mL | Freq: Once | INTRAVENOUS | Status: AC
  Administered 2019-08-21: 02:00:00 250 mL via INTRAVENOUS

## 2019-08-21 MED ORDER — CALCITRIOL 0.25 MCG PO CAPS
0.5000 ug | ORAL_CAPSULE | Freq: Every day | ORAL | Status: DC
  Administered 2019-08-21 – 2019-08-25 (×5): 0.5 ug via ORAL
  Filled 2019-08-21 (×5): qty 2

## 2019-08-21 NOTE — Progress Notes (Signed)
Alerted floor RN Alver Fisher we will try to get patient on  Sunday MD is aware BP is low

## 2019-08-21 NOTE — Progress Notes (Signed)
[facility] NEPHROLOGY PROGRESS NOTE  Assessment/ Plan: Pt is a 78 y.o. yo male  W/ pmh of CAD s/p CABG, DM, HTN, HLD, CKD IV, who presented w/ NSTEMI, now s/p cardiac cath complicated by perforated SVG to RCA graft  # NSTEMI/CAD: s/p cardiac cath with perforation.  Cardiology following. Currently on plavix.   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  No obstruction on renal u/s. Required CRRT, which was stopped 10/29. On MWF HD schedule. Vascular surgery placed TDC and AV fistula yesterday.  - dialysis today, 11/7 - renal navigator making arrangements for outpatient HD  #Hypotension: Hypotensive overnight- may adjust dialysis. - midodrine   # new onset a fib/flutter: off beta blocker.  - aspirin, plavix  #anemia: iron sat 20%. IV iron and ESA during dialysis.  Monitor CBC. hgb 8.3 today  # Secondary hyperparathyroidism: phosphorus elevated to 6.4. PTH elevated at 535, Ca 7.8, consistent with secondary hyperPTH. Controlled with HD.   Subjective:     Objective General:NAD, comfortable, sitting in bed.  Heart:RRR, s1s2 nl Lungs:clear b/l, mild basilar crackle Abdomen:soft, Non-tender, non-distended Extremities: trace edema bilaterally Dialysis Access: right IJ temp cath  Vital signs in last 24 hours: Vitals:   08/20/19 2043 08/20/19 2354 08/21/19 0100 08/21/19 0538  BP: (!) 82/51 (!) 90/32 (!) 80/37 (!) 101/53  Pulse: 76 63 64 70  Resp: 20 16 (!) 22 19  Temp: 98.1 F (36.7 C)   98.5 F (36.9 C)  TempSrc: Oral   Oral  SpO2: 95% 97% 96% 99%  Weight:    95.1 kg  Height:       Weight change: 0 kg  Intake/Output Summary (Last 24 hours) at 08/21/2019 0730 Last data filed at 08/21/2019 0541 Gross per 24 hour  Intake 1077.37 ml  Output 235 ml  Net 842.37 ml   Labs: Basic Metabolic Panel: Recent Labs  Lab 08/19/19 0500 08/20/19 0242 08/21/19 0227  NA 138 138 137  K 4.1 3.9 4.4  CL 102 101 103  CO2 25 23 21*   GLUCOSE 219* 144* 162*  BUN 28* 37* 45*  CREATININE 5.93* 8.07* 8.99*  CALCIUM 8.2* 8.5* 7.8*  PHOS 4.5 5.8* 6.4*   Liver Function Tests: Recent Labs  Lab 08/19/19 0500 08/20/19 0242 08/21/19 0227  ALBUMIN 2.1* 2.2* 2.1*   No results for input(s): LIPASE, AMYLASE in the last 168 hours. No results for input(s): AMMONIA in the last 168 hours. CBC: Recent Labs  Lab 08/15/19 0410 08/16/19 0711 08/17/19 0324 08/20/19 0242 08/21/19 0227  WBC 12.8* 12.2* 12.9* 13.7* 12.9*  HGB 9.2* 8.7* 8.9* 9.1* 8.3*  HCT 28.7* 26.9* 27.2* 28.7* 27.2*  MCV 100.0 100.7* 100.4* 103.6* 106.7*  PLT 122* 140* 146* 205 184   Cardiac Enzymes: No results for input(s): CKTOTAL, CKMB, CKMBINDEX, TROPONINI in the last 168 hours. CBG: Recent Labs  Lab 08/20/19 0556 08/20/19 1451 08/20/19 1647 08/20/19 2109 08/21/19 0622  GLUCAP 148* 137* 144* 182* 133*   Studies/Results: Dg Chest Port 1 View  Result Date: 08/20/2019 CLINICAL DATA:  Central line placement EXAM: PORTABLE CHEST 1 VIEW COMPARISON:  08/12/2019 FINDINGS: There has been interval replacement of a large-bore right neck multi lumen vascular catheter, tip advanced now near the superior cavoatrial junction. Otherwise unchanged examination with cardiomegaly status post median sternotomy and without acute abnormality of the lungs AP portable projection. IMPRESSION: There has been interval replacement of a large-bore right neck multi lumen vascular catheter, tip advanced now  near the superior cavoatrial junction. Electronically Signed   By: Eddie Candle M.D.   On: 08/20/2019 16:33   Dg Fluoro Guide Cv Line-no Report  Result Date: 08/20/2019 Fluoroscopy was utilized by the requesting physician.  No radiographic interpretation.   Medications: Infusions: . ferric gluconate (FERRLECIT/NULECIT) IV Stopped (08/18/19 1709)  . heparin 1,050 Units/hr (08/20/19 1612)  . lactated ringers 10 mL/hr at 08/20/19 9518    Scheduled Medications: . aspirin  EC  81 mg Oral Daily  . atorvastatin  80 mg Oral q1800  . Chlorhexidine Gluconate Cloth  6 each Topical Daily  . Chlorhexidine Gluconate Cloth  6 each Topical Q0600  . Chlorhexidine Gluconate Cloth  6 each Topical Q0600  . clopidogrel  75 mg Oral Daily  . darbepoetin (ARANESP) injection - DIALYSIS  100 mcg Intravenous Q Wed-HD  . feeding supplement (NEPRO CARB STEADY)  237 mL Oral TID BM  . insulin aspart  0-15 Units Subcutaneous TID WC  . insulin aspart  0-5 Units Subcutaneous QHS  . insulin glargine  15 Units Subcutaneous BID  . levothyroxine  300 mcg Oral Q0600  . midodrine  5 mg Oral TID WC  . multivitamin  1 tablet Oral QHS  . pantoprazole  40 mg Oral Daily  . sodium chloride flush  10-40 mL Intracatheter Q12H    have reviewed scheduled and prn medications.   Chelsey L Anderson 08/21/2019,7:30 AM  LOS: 15 days  Pager: [phone]

## 2019-08-21 NOTE — Progress Notes (Signed)
Progress Note  Patient Name: [name] Date of Encounter: 08/21/2019  Primary Cardiologist: Corey Skains, MD  Subjective   Somnolent this morning.  Does not report any chest pain.  Blood pressures relatively low overnight however have increased this morning.  Inpatient Medications    Scheduled Meds: . aspirin EC  81 mg Oral Daily  . atorvastatin  80 mg Oral q1800  . Chlorhexidine Gluconate Cloth  6 each Topical Daily  . Chlorhexidine Gluconate Cloth  6 each Topical Q0600  . Chlorhexidine Gluconate Cloth  6 each Topical Q0600  . clopidogrel  75 mg Oral Daily  . darbepoetin (ARANESP) injection - DIALYSIS  100 mcg Intravenous Q Wed-HD  . feeding supplement (NEPRO CARB STEADY)  237 mL Oral TID BM  . insulin aspart  0-15 Units Subcutaneous TID WC  . insulin aspart  0-5 Units Subcutaneous QHS  . insulin glargine  15 Units Subcutaneous BID  . levothyroxine  300 mcg Oral Q0600  . midodrine  5 mg Oral TID WC  . multivitamin  1 tablet Oral QHS  . pantoprazole  40 mg Oral Daily  . sodium chloride flush  10-40 mL Intracatheter Q12H   Continuous Infusions: . ferric gluconate (FERRLECIT/NULECIT) IV Stopped (08/18/19 1709)  . heparin 1,100 Units/hr (08/21/19 0901)  . lactated ringers 10 mL/hr at 08/20/19 0928   PRN Meds: acetaminophen, albuterol, food thickener, guaiFENesin, heparin, loperamide, nitroGLYCERIN, ondansetron (ZOFRAN) IV, Resource ThickenUp Clear, sodium chloride, sodium chloride flush   Vital Signs    Vitals:   08/20/19 2354 08/21/19 0100 08/21/19 0538 08/21/19 0844  BP: (!) 90/32 (!) 80/37 (!) 101/53 (!) 152/57  Pulse: 63 64 70 70  Resp: 16 (!) 22 19 18   Temp:   98.5 F (36.9 C) 97.9 F (36.6 C)  TempSrc:   Oral Oral  SpO2: 97% 96% 99% 95%  Weight:   95.1 kg   Height:        Intake/Output Summary (Last 24 hours) at 08/21/2019 1025 Last data filed at 08/21/2019 0541 Gross per 24 hour  Intake 907.29 ml  Output 235 ml  Net 672.29 ml   Filed Weights    08/20/19 0456 08/20/19 0922 08/21/19 0538  Weight: 89.6 kg 89.6 kg 95.1 kg    Telemetry    Atrial flutter with controlled ventricular response.  Personally reviewed.  Physical Exam   GEN:  Elderly male.  No acute distress.   Neck:  Mildly increased JVP. Cardiac:  Irregular, no gallop.  Respiratory: Nonlabored. Clear to auscultation bilaterally. GI: Soft, nontender, bowel sounds present. MS: No edema; No deformity. Neuro:  Nonfocal. Psych: Somnolent this morning but answers questions.  Labs    Chemistry Recent Labs  Lab 08/19/19 0500 08/20/19 0242 08/21/19 0227  NA 138 138 137  K 4.1 3.9 4.4  CL 102 101 103  CO2 25 23 21*  GLUCOSE 219* 144* 162*  BUN 28* 37* 45*  CREATININE 5.93* 8.07* 8.99*  CALCIUM 8.2* 8.5* 7.8*  ALBUMIN 2.1* 2.2* 2.1*  GFRNONAA 8* 6* 5*  GFRAA 10* 7* 6*  ANIONGAP 11 14 13      Hematology Recent Labs  Lab 08/17/19 0324 08/20/19 0242 08/21/19 0227  WBC 12.9* 13.7* 12.9*  RBC 2.71* 2.77* 2.55*  HGB 8.9* 9.1* 8.3*  HCT 27.2* 28.7* 27.2*  MCV 100.4* 103.6* 106.7*  MCH 32.8 32.9 32.5  MCHC 32.7 31.7 30.5  RDW 15.9* 15.9* 16.1*  PLT 146* 205 184    Cardiac Enzymes Recent Labs  Lab 08/04/19 2250 08/05/19 0043 08/05/19 0818 08/05/19 1624  TROPONINIHS 471* 507* 1,266* 1,123*    Radiology    Dg Chest Port 1 View  Result Date: 08/20/2019 CLINICAL DATA:  Central line placement EXAM: PORTABLE CHEST 1 VIEW COMPARISON:  08/12/2019 FINDINGS: There has been interval replacement of a large-bore right neck multi lumen vascular catheter, tip advanced now near the superior cavoatrial junction. Otherwise unchanged examination with cardiomegaly status post median sternotomy and without acute abnormality of the lungs AP portable projection. IMPRESSION: There has been interval replacement of a large-bore right neck multi lumen vascular catheter, tip advanced now near the superior cavoatrial junction. Electronically Signed   By: Eddie Candle M.D.    On: 08/20/2019 16:33   Dg Fluoro Guide Cv Line-no Report  Result Date: 08/20/2019 Fluoroscopy was utilized by the requesting physician.  No radiographic interpretation.    Cardiac Studies   Echocardiogram 08/07/2019:  1. Left ventricular ejection fraction, by visual estimation, is 60 to 65%. The left ventricle has normal function. Normal left ventricular size. There is no left ventricular hypertrophy.  2. Global right ventricle has normal systolic function.The right ventricular size is normal. No increase in right ventricular wall thickness.  3. Left atrial size was normal.  4. Right atrial size was norma  5. Severe mitral annular calcification. Mild calcification of the anterior mitral valve leaflet(s).. No evidence of mitral valve regurgitation. Mild mitral stenosis.  6. The tricuspid valve is normal in structure. Tricuspid valve regurgitation is mild.  7. The aortic valve is tricuspid Aortic valve regurgitation is mild by color flow Doppler. Moderate aortic valve sclerosis/calcification without any evidence of aortic stenosis.  8. The pulmonic valve was normal in structure. Pulmonic valve regurgitation is not visualized by color flow Doppler.  9. Normal pulmonary artery systolic pressure. 10. The inferior vena cava is normal in size with greater than 50% respiratory variability, suggesting right atrial pressure of 3 mmHg. 11. No evidence of pericardial effusion.  Cardiac catheterization 08/06/2019:  Ost RCA to Prox RCA lesion is 100% stenosed.  Ost Cx lesion is 75% stenosed.  Ramus lesion is 75% stenosed.  Dist LM to Prox LAD lesion is 85% stenosed.  Mid Graft lesion is 85% stenosed.  Origin lesion is 100% stenosed.   78 year old male with hypertension hyperlipidemia and known coronary artery bypass surgery in the past with acute on chronic kidney disease needing dialysis treatment.  The patient has acute on this set of chest discomfort intermittent over the last 48 hours with  elevated troponin consistent with non-ST elevation myocardial infarction.  Left ventricle not injected due to kidney disease  100% stenosis of right coronary artery ostium Distal left main coronary atherosclerosis of 75 to 80% to trifurcation of left anterior descending artery, circumflex artery, and ramus artery Patent mammary graft to the left anterior descending artery Occluded saphenous vein graft to obtuse marginal 1 Patent but significant stenosis and thrombosed proximal graft to right coronary artery  Plan PCI stent placement of graft to right coronary artery Dual antiplatelet therapy Heparin Continue  high intensity cholesterol therapy No change in medication management for hypertension control  PCI 08/06/2019: Conclusions: 1. See diagnostic angiogram by Dr. Nehemiah Massed for details of coronary/graft anatomy. 2. Severe disease involving SVG-RCA with 80-90% stenosis. 3. Successful PCI to SVG-RCA using Resolute Onyx 4.0 x 38 mm drug-eluting stent with 10% residual stenosis and TIMI-3 flow. 4. Focal perforation of the SVG-RCA during stent deployment.  This was successfully contained with internal tamponade.  Recommendations:  1. Dual antiplatelet therapy with aspirin and ticagrelor for at least 12 months. 2. Aggressive secondary prevention. 3. Transfer to [facility] for ICU monitoring, given focal perforation of SVG-RCA.  If the patient were to become hemodynamically unstable or develop worsening anemia, repeat angiography of SVG-RCA and possible covered stent placement would need to be considered.  Patient Profile     78 y.o. male with a history of CAD s/p CABG, DM, HTN, HLD, CKD stage 4 who presented with an NSTEMI s/p cardiac catheterizaion complicated by perforated SVG to RCA graft who was transferred from [facility] also with CKD stage 5 requiring HD.  Assessment & Plan    1.  Multivessel CAD status post CABG with recent NSTEMI and DES intervention to the SVG to RCA  complicated by contained perforation and clinically stable at this time on aspirin and Plavix.  He is not on beta-blockers due to intermittent hypotension.  2.  Persistent atrial flutter with variable conduction and controlled heart rate in the absence of AV nodal blockers.  Coumadin has been resumed with follow-up by pharmacy.  3.  Intermittent orthostasis and hypotension, presently on midodrine which has been increased to 5 mg 3 times daily.  4.  CKD stage V requiring intermittent hemodialysis.  He underwent placement of left upper extremity AV fistula yesterday, has a right IJ catheter intact as well.  He is followed by the nephrology service.  Chart reviewed.  Patient with slow recovery overall.  Appreciate input from nephrology.  Patient is stable after recent placement of left upper extremity AV fistula.  Coumadin has been resumed per pharmacy and he also continues on aspirin and Plavix (combination for 30 days then Plavix alone).  Continue midodrine.  PT as tolerated although he has not made much progress.  Signed, Rozann Lesches, MD  08/21/2019, 10:25 AM

## 2019-08-21 NOTE — Significant Event (Signed)
Cardiology Moonlighter Note  Contacted by care nurse about patient's blood pressure. Has had several readings tonight with systolic pressures in the 60's to 70's. Patient completely asymptomatic with this. Received 500cc fluid bolus with no change in blood pressure. Still asymptomatic. On review of patient's chart, hypotension has been a persistent issue throughout hospitalization. Was started on midodrine, which has been increased. Has missed dialysis due to hypotension. Overnight last night patient had similar hypotension, lactic acid was checked and was normal x 2.   I have reordered a lactic acid to be checked tonight as well as blood cultures to make sure we are not missing a smoldering infection. Will give an extra dose of midodrine. Ultimately patient may need a higher standing dose of midodrine or an additional agent such as fludrocortisone. As long as patient remains asymptomatic, will defer transfer to the ICU for pressor therapy. This will however be the next course of action if the patient develops symptomatic hypotension.  Marcie Mowers, MD Cardiology Fellow, PGY-7

## 2019-08-21 NOTE — Progress Notes (Signed)
ANTICOAGULATION CONSULT NOTE - Follow Up Consult  Pharmacy Consult for heparin Indication: atrial fibrillation (CHADS2VASc = 7)  Allergies  Allergen Reactions  . Ace Inhibitors Other (See Comments)    Reaction:  Raises potassium   . Quinapril Rash and Other (See Comments)    hyperkalemia    Patient Measurements: Height: 6\' 2"  (188 cm) Weight: 209 lb 10.5 oz (95.1 kg) IBW/kg (Calculated) : 82.2 Heparin Dosing Weight: 90.7 kg  Vital Signs: Temp: 98.2 F (36.8 C) (11/07 1138) Temp Source: Axillary (11/07 1138) BP: 120/50 (11/07 1138) Pulse Rate: 70 (11/07 0844)  Labs: Recent Labs    08/19/19 0500 08/19/19 2144 08/20/19 0242 08/20/19 0802 08/21/19 0227 08/21/19 0719  HGB  --   --  9.1*  --  8.3*  --   HCT  --   --  28.7*  --  27.2*  --   PLT  --   --  205  --  184  --   LABPROT 22.2*  --  17.9*  --  16.7*  --   INR 2.0*  --  1.5*  --  1.4*  --   HEPARINUNFRC  --  0.14*  --  0.40  --  0.30  CREATININE 5.93*  --  8.07*  --  8.99*  --     Estimated Creatinine Clearance: 7.7 mL/min (A) (by C-G formula based on SCr of 8.99 mg/dL (H)).   Assessment: 78 yo M started on heparin for NSTEMI, s/p PCI with DES to SVG-RCA on 10/23 and continued for Afib (CHADS2VASC = 7). No anticoagulation prior to admission. Warfarin was started 10/29, then held starting 11/2 for AV Fistula placement. While warfarin was being held for AV fistula placement and patient was started on IV heparin and has been continued on IV heparin 1100 units/hr. AV fistula was placed on 11/6 and vascular recommended resuming warfarin.   Pharmacy has been consulted for warfarin and heparin dosing for Afib. INR 1.4 is subtherapeutic. CBC stable. No reported bleeding.   Goal of Therapy:  INR 2-3  Heparin level 0.3-0.7 units/ml Monitor platelets by anticoagulation protocol: Yes  Plan:  Warfarin 5mg  x1 tonight Continue heparin 1100 units/hr Monitor daily INR, heparin level, CBC, S/S bleeding Will follow up  cardiology recommendations on heparin bridge with plan to continue heparin for now given high CHAD2-VASc score and subtherapeutic INR  Cristela Felt, PharmD PGY1 Pharmacy Resident [employer]: [phone]  08/21/2019 2:55 PM

## 2019-08-21 NOTE — Progress Notes (Signed)
Patient ID: [name], male   DOB: 1940/12/30, 78 y.o.   MRN: [id number]  Progress Note    08/21/2019 8:26 AM 1 Day Post-Op  Subjective: Comfortable.  No steal symptoms in his left arm.   Vitals:   08/21/19 0100 08/21/19 0538  BP: (!) 80/37 (!) 101/53  Pulse: 64 70  Resp: (!) 22 19  Temp:  98.5 F (36.9 C)  SpO2: 96% 99%   Physical Exam: Excellent thrill in his left upper arm AV fistula.  Antecubital incision healing.  Right IJ catheter intact  CBC    Component Value Date/Time   WBC 12.9 (H) 08/21/2019 0227   RBC 2.55 (L) 08/21/2019 0227   HGB 8.3 (L) 08/21/2019 0227   HGB 16.6 12/24/2016 1541   HCT 27.2 (L) 08/21/2019 0227   HCT 51.1 (H) 12/24/2016 1541   PLT 184 08/21/2019 0227   PLT 171 12/24/2016 1541   MCV 106.7 (H) 08/21/2019 0227   MCV 97 12/24/2016 1541   MCV 90 09/01/2013 0951   MCH 32.5 08/21/2019 0227   MCHC 30.5 08/21/2019 0227   RDW 16.1 (H) 08/21/2019 0227   RDW 14.3 12/24/2016 1541   RDW 15.0 (H) 09/01/2013 0951   LYMPHSABS 0.8 08/10/2019 1031   LYMPHSABS 2.8 12/24/2016 1541   LYMPHSABS 2.1 09/01/2013 0951   MONOABS 0.6 08/10/2019 1031   MONOABS 0.6 09/01/2013 0951   EOSABS 0.0 08/10/2019 1031   EOSABS 0.1 12/24/2016 1541   EOSABS 0.1 09/01/2013 0951   BASOSABS 0.0 08/10/2019 1031   BASOSABS 0.1 12/24/2016 1541   BASOSABS 0.1 09/01/2013 0951    BMET    Component Value Date/Time   NA 137 08/21/2019 0227   NA 135 (A) 06/16/2018   NA 140 12/10/2012 0726   K 4.4 08/21/2019 0227   K 4.6 12/10/2012 0726   CL 103 08/21/2019 0227   CL 111 (H) 12/10/2012 0726   CO2 21 (L) 08/21/2019 0227   CO2 20 (L) 12/10/2012 0726   GLUCOSE 162 (H) 08/21/2019 0227   GLUCOSE 181 (H) 12/10/2012 0726   BUN 45 (H) 08/21/2019 0227   BUN 57 (A) 06/16/2018   BUN 33 (H) 09/01/2013 0951   CREATININE 8.99 (H) 08/21/2019 0227   CREATININE 2.27 (H) 09/01/2013 0951   CALCIUM 7.8 (L) 08/21/2019 0227   CALCIUM 8.0 (L) 08/17/2019 0324   GFRNONAA 5 (L) 08/21/2019  0227   GFRNONAA 28 (L) 09/01/2013 0951   GFRAA 6 (L) 08/21/2019 0227   GFRAA 32 (L) 09/01/2013 0951    INR    Component Value Date/Time   INR 1.4 (H) 08/21/2019 0227   INR 1.0 09/01/2013 0951     Intake/Output Summary (Last 24 hours) at 08/21/2019 0826 Last data filed at 08/21/2019 0541 Gross per 24 hour  Intake 907.29 ml  Output 235 ml  Net 672.29 ml     Assessment/Plan:  78 y.o. male stable postop day 1 from left AV fistula creation and right tunneled catheter.  No evidence of steal.  Will not follow actively.  I will see him in the office in 1 month for continued follow-up     Rosetta Posner, MD [facility] [phone] 08/21/2019 8:26 AM

## 2019-08-21 NOTE — Progress Notes (Signed)
Call to oncall physician regarding low bp, orders given. Will continue monitoring.

## 2019-08-21 NOTE — Progress Notes (Addendum)
Notified Cardiology MD on call of hypotension in patient, asymptomatic at this time with a BP of 80/51. No new orders at this time, instructed to call back if pt becomes symptomatic. 08/21/19 2030  Pt unable to receive HD tonight as planned d/t hypotension. Dr. Jonnie Finner with [facility] given this RN verbal order for 250 NS bolus up to three times for hypotension. HD plan per note for Sunday. 08/21/19 2100  Updated Cardiology MD of persistent hypotension with systolic pressures in 16B and 60s. Patient remains asymptomatic. Given order for one time dose of midodrine.

## 2019-08-21 NOTE — Progress Notes (Signed)
ANTICOAGULATION CONSULT NOTE - Follow Up Consult  Pharmacy Consult for heparin Indication: atrial fibrillation (CHADS2VASc = 7)  Allergies  Allergen Reactions  . Ace Inhibitors Other (See Comments)    Reaction:  Raises potassium   . Quinapril Rash and Other (See Comments)    hyperkalemia    Patient Measurements: Height: 6\' 2"  (188 cm) Weight: 209 lb 10.5 oz (95.1 kg) IBW/kg (Calculated) : 82.2 Heparin Dosing Weight: 90.7 kg  Vital Signs: Temp: 98.5 F (36.9 C) (11/07 0538) Temp Source: Oral (11/07 0538) BP: 101/53 (11/07 0538) Pulse Rate: 70 (11/07 0538)  Labs: Recent Labs    08/19/19 0500 08/19/19 2144 08/20/19 0242 08/20/19 0802 08/21/19 0227 08/21/19 0719  HGB  --   --  9.1*  --  8.3*  --   HCT  --   --  28.7*  --  27.2*  --   PLT  --   --  205  --  184  --   LABPROT 22.2*  --  17.9*  --  16.7*  --   INR 2.0*  --  1.5*  --  1.4*  --   HEPARINUNFRC  --  0.14*  --  0.40  --  0.30  CREATININE 5.93*  --  8.07*  --  8.99*  --     Estimated Creatinine Clearance: 7.7 mL/min (A) (by C-G formula based on SCr of 8.99 mg/dL (H)).   Assessment: 78 yo M started on heparin for NSTEMI, s/p PCI with DES to SVG-RCA on 10/23 and continued for Afib (CHADS2VASC = 7). No anticoagulation prior to admission. Warfarin was started 10/29, then held starting 11/2 for AV Fistula placement (goal INR <1.7 for OR). Warfarin is being held for AV fistula placement which was done on 11/6.   Heparin started 11/5 when INR appropriate, trended down to 1.4 today after vitamin K 5mg  PO x 1 on 11/5. Heparin level of 0.3 today is therapeutic on heparin 1050 units/hr thought at the lowest end of the therapeutic range (0.3 to 0.7). CBC stable. No active bleed issues reported.  Spoke with Dr. Donnetta Hutching who is okay to resume warfarin today.    Goal of Therapy:  Heparin level 0.3-0.7 units/ml Monitor platelets by anticoagulation protocol: Yes  Plan:  Slightly increase heparin to 1100 units/hr to ensure  patient stays in therapeutic range  Monitor daily heparin level and CBC, S/S bleeding Notified cardiology that vascular is okay to resume warfarin today and will follow up plans to resume warfarin   Cristela Felt, PharmD PGY1 Pharmacy Resident [employer]: [phone]  08/21/2019 8:30 AM

## 2019-08-22 LAB — RENAL FUNCTION PANEL
Albumin: 2.1 g/dL — ABNORMAL LOW (ref 3.5–5.0)
Anion gap: 15 (ref 5–15)
BUN: 53 mg/dL — ABNORMAL HIGH (ref 8–23)
CO2: 19 mmol/L — ABNORMAL LOW (ref 22–32)
Calcium: 8.3 mg/dL — ABNORMAL LOW (ref 8.9–10.3)
Chloride: 105 mmol/L (ref 98–111)
Creatinine, Ser: 10.09 mg/dL — ABNORMAL HIGH (ref 0.61–1.24)
GFR calc Af Amer: 5 mL/min — ABNORMAL LOW (ref >60)
GFR calc non Af Amer: 4 mL/min — ABNORMAL LOW (ref >60)
Glucose, Bld: 127 mg/dL — ABNORMAL HIGH (ref 70–99)
Phosphorus: 7.1 mg/dL — ABNORMAL HIGH (ref 2.5–4.6)
Potassium: 4.4 mmol/L (ref 3.5–5.1)
Sodium: 139 mmol/L (ref 135–145)

## 2019-08-22 LAB — CBC
HCT: 27.6 % — ABNORMAL LOW (ref 39.0–52.0)
Hemoglobin: 8.4 g/dL — ABNORMAL LOW (ref 13.0–17.0)
MCH: 33.1 pg (ref 26.0–34.0)
MCHC: 30.4 g/dL (ref 30.0–36.0)
MCV: 108.7 fL — ABNORMAL HIGH (ref 80.0–100.0)
Platelets: 193 10*3/uL (ref 150–400)
RBC: 2.54 MIL/uL — ABNORMAL LOW (ref 4.22–5.81)
RDW: 16.5 % — ABNORMAL HIGH (ref 11.5–15.5)
WBC: 11.1 10*3/uL — ABNORMAL HIGH (ref 4.0–10.5)
nRBC: 0.4 % — ABNORMAL HIGH (ref 0.0–0.2)

## 2019-08-22 LAB — MAGNESIUM: Magnesium: 2.3 mg/dL (ref 1.7–2.4)

## 2019-08-22 LAB — HEPARIN LEVEL (UNFRACTIONATED): Heparin Unfractionated: 0.37 IU/mL (ref 0.30–0.70)

## 2019-08-22 LAB — GLUCOSE, CAPILLARY
Glucose-Capillary: 124 mg/dL — ABNORMAL HIGH (ref 70–99)
Glucose-Capillary: 240 mg/dL — ABNORMAL HIGH (ref 70–99)
Glucose-Capillary: 139 mg/dL — ABNORMAL HIGH (ref 70–99)
Glucose-Capillary: 109 mg/dL — ABNORMAL HIGH (ref 70–99)

## 2019-08-22 LAB — PROTIME-INR
INR: 1.3 — ABNORMAL HIGH (ref 0.8–1.2)
Prothrombin Time: 15.9 seconds — ABNORMAL HIGH (ref 11.4–15.2)

## 2019-08-22 MED ORDER — WARFARIN SODIUM 5 MG PO TABS
5.0000 mg | ORAL_TABLET | Freq: Once | ORAL | Status: AC
  Administered 2019-08-22: 5 mg via ORAL
  Filled 2019-08-22: qty 1

## 2019-08-22 MED ORDER — FLUDROCORTISONE ACETATE 0.1 MG PO TABS
0.1000 mg | ORAL_TABLET | Freq: Every day | ORAL | Status: DC
  Administered 2019-08-22 – 2019-08-25 (×4): 0.1 mg via ORAL
  Filled 2019-08-22 (×6): qty 1

## 2019-08-22 MED ORDER — MIDODRINE HCL 5 MG PO TABS
10.0000 mg | ORAL_TABLET | Freq: Three times a day (TID) | ORAL | Status: DC
  Administered 2019-08-22 – 2019-08-26 (×13): 10 mg via ORAL
  Filled 2019-08-22 (×19): qty 2

## 2019-08-22 MED ORDER — OXYCODONE-ACETAMINOPHEN 5-325 MG PO TABS
1.0000 | ORAL_TABLET | ORAL | Status: DC | PRN
  Administered 2019-08-22: 1 via ORAL
  Filled 2019-08-22: qty 1

## 2019-08-22 NOTE — Progress Notes (Signed)
Dr. Carolin Sicks saw pt. At bedside for low bp. Orders to hold HD tx for today

## 2019-08-22 NOTE — Progress Notes (Signed)
AT 1700 page to on call Cardiology.  Spoke with Quita Skye, Cardiology on call.  Discussed the low asymptomatic blood pressure.  Cardiology stated that he reviewed the chart and does not need further intervention at this time unless he develops other symptoms like decreased loc or EHG changes.

## 2019-08-22 NOTE — Progress Notes (Signed)
Progress Note  Patient Name: [name] Date of Encounter: 08/22/2019  Primary Cardiologist: Corey Skains, MD  Subjective   No reported chest pain or shortness of breath.  Has not been up out of bed.  Blood pressures low again last night but on assessment was not clearly symptomatic.  Midodrine has been increased.  Inpatient Medications    Scheduled Meds:  aspirin EC  81 mg Oral Daily   atorvastatin  80 mg Oral q1800   calcitRIOL  0.5 mcg Oral Daily   Chlorhexidine Gluconate Cloth  6 each Topical Daily   Chlorhexidine Gluconate Cloth  6 each Topical Q0600   Chlorhexidine Gluconate Cloth  6 each Topical Q0600   clopidogrel  75 mg Oral Daily   darbepoetin (ARANESP) injection - DIALYSIS  100 mcg Intravenous Q Wed-HD   feeding supplement (NEPRO CARB STEADY)  237 mL Oral TID BM   fludrocortisone  0.1 mg Oral Daily   insulin aspart  0-15 Units Subcutaneous TID WC   insulin aspart  0-5 Units Subcutaneous QHS   insulin glargine  15 Units Subcutaneous BID   levothyroxine  300 mcg Oral Q0600   midodrine  10 mg Oral TID WC   multivitamin  1 tablet Oral QHS   pantoprazole  40 mg Oral Daily   sevelamer carbonate  800 mg Oral TID WC   sodium chloride flush  10-40 mL Intracatheter Q12H   warfarin  5 mg Oral ONCE-1800   Warfarin - Pharmacist Dosing Inpatient   Does not apply q1800   Continuous Infusions:  ferric gluconate (FERRLECIT/NULECIT) IV Stopped (08/18/19 1709)   heparin 1,100 Units/hr (08/22/19 2094)   lactated ringers 10 mL/hr at 08/20/19 0928   PRN Meds: acetaminophen, albuterol, food thickener, guaiFENesin, heparin, loperamide, nitroGLYCERIN, ondansetron (ZOFRAN) IV, Resource ThickenUp Clear, sodium chloride, sodium chloride flush   Vital Signs    Vitals:   08/22/19 0604 08/22/19 0635 08/22/19 0815 08/22/19 0818  BP: (!) 69/47 (!) 82/38 (!) 89/34 (!) 108/38  Pulse: 69 60 60 64  Resp: 20 17 17 18   Temp: 98 F (36.7 C)     TempSrc: Oral      SpO2: 96% 92% 96% 97%  Weight:      Height:        Intake/Output Summary (Last 24 hours) at 08/22/2019 0923 Last data filed at 08/22/2019 0800 Gross per 24 hour  Intake 1171.66 ml  Output 500 ml  Net 671.66 ml   Filed Weights   08/20/19 0922 08/21/19 0538 08/22/19 0500  Weight: 89.6 kg 95.1 kg 89.4 kg    Telemetry    Atrial flutter with 4:1 block.  Personally reviewed.  Physical Exam   GEN:  Elderly male.  No acute distress.   Neck:  Mildly increased JVP. Cardiac:  Irregular, no gallop.  Respiratory: Nonlabored. Clear to auscultation bilaterally. GI: Soft, nontender, bowel sounds present. MS:  No pitting edema; No deformity. Neuro:  Nonfocal.  Labs    Chemistry Recent Labs  Lab 08/20/19 0242 08/21/19 0227 08/22/19 0318  NA 138 137 139  K 3.9 4.4 4.4  CL 101 103 105  CO2 23 21* 19*  GLUCOSE 144* 162* 127*  BUN 37* 45* 53*  CREATININE 8.07* 8.99* 10.09*  CALCIUM 8.5* 7.8* 8.3*  ALBUMIN 2.2* 2.1* 2.1*  GFRNONAA 6* 5* 4*  GFRAA 7* 6* 5*  ANIONGAP 14 13 15      Hematology Recent Labs  Lab 08/20/19 0242 08/21/19 0227 08/22/19 0318  WBC 13.7*  12.9* 11.1*  RBC 2.77* 2.55* 2.54*  HGB 9.1* 8.3* 8.4*  HCT 28.7* 27.2* 27.6*  MCV 103.6* 106.7* 108.7*  MCH 32.9 32.5 33.1  MCHC 31.7 30.5 30.4  RDW 15.9* 16.1* 16.5*  PLT 205 184 193    Cardiac Enzymes Recent Labs  Lab 08/04/19 2250 08/05/19 0043 08/05/19 0818 08/05/19 1624  TROPONINIHS 471* 507* 1,266* 1,123*    Radiology    Dg Chest Port 1 View  Result Date: 08/20/2019 CLINICAL DATA:  Central line placement EXAM: PORTABLE CHEST 1 VIEW COMPARISON:  08/12/2019 FINDINGS: There has been interval replacement of a large-bore right neck multi lumen vascular catheter, tip advanced now near the superior cavoatrial junction. Otherwise unchanged examination with cardiomegaly status post median sternotomy and without acute abnormality of the lungs AP portable projection. IMPRESSION: There has been interval  replacement of a large-bore right neck multi lumen vascular catheter, tip advanced now near the superior cavoatrial junction. Electronically Signed   By: Eddie Candle M.D.   On: 08/20/2019 16:33   Dg Fluoro Guide Cv Line-no Report  Result Date: 08/20/2019 Fluoroscopy was utilized by the requesting physician.  No radiographic interpretation.    Cardiac Studies   Echocardiogram 08/07/2019:  1. Left ventricular ejection fraction, by visual estimation, is 60 to 65%. The left ventricle has normal function. Normal left ventricular size. There is no left ventricular hypertrophy.  2. Global right ventricle has normal systolic function.The right ventricular size is normal. No increase in right ventricular wall thickness.  3. Left atrial size was normal.  4. Right atrial size was norma  5. Severe mitral annular calcification. Mild calcification of the anterior mitral valve leaflet(s).. No evidence of mitral valve regurgitation. Mild mitral stenosis.  6. The tricuspid valve is normal in structure. Tricuspid valve regurgitation is mild.  7. The aortic valve is tricuspid Aortic valve regurgitation is mild by color flow Doppler. Moderate aortic valve sclerosis/calcification without any evidence of aortic stenosis.  8. The pulmonic valve was normal in structure. Pulmonic valve regurgitation is not visualized by color flow Doppler.  9. Normal pulmonary artery systolic pressure. 10. The inferior vena cava is normal in size with greater than 50% respiratory variability, suggesting right atrial pressure of 3 mmHg. 11. No evidence of pericardial effusion.  Cardiac catheterization 08/06/2019:  Ost RCA to Prox RCA lesion is 100% stenosed.  Ost Cx lesion is 75% stenosed.  Ramus lesion is 75% stenosed.  Dist LM to Prox LAD lesion is 85% stenosed.  Mid Graft lesion is 85% stenosed.  Origin lesion is 100% stenosed.   78 year old male with hypertension hyperlipidemia and known coronary artery bypass surgery  in the past with acute on chronic kidney disease needing dialysis treatment.  The patient has acute on this set of chest discomfort intermittent over the last 48 hours with elevated troponin consistent with non-ST elevation myocardial infarction.  Left ventricle not injected due to kidney disease  100% stenosis of right coronary artery ostium Distal left main coronary atherosclerosis of 75 to 80% to trifurcation of left anterior descending artery, circumflex artery, and ramus artery Patent mammary graft to the left anterior descending artery Occluded saphenous vein graft to obtuse marginal 1 Patent but significant stenosis and thrombosed proximal graft to right coronary artery  Plan PCI stent placement of graft to right coronary artery Dual antiplatelet therapy Heparin Continue  high intensity cholesterol therapy No change in medication management for hypertension control  PCI 08/06/2019: Conclusions: 1. See diagnostic angiogram by Dr. Nehemiah Massed for details of  coronary/graft anatomy. 2. Severe disease involving SVG-RCA with 80-90% stenosis. 3. Successful PCI to SVG-RCA using Resolute Onyx 4.0 x 38 mm drug-eluting stent with 10% residual stenosis and TIMI-3 flow. 4. Focal perforation of the SVG-RCA during stent deployment.  This was successfully contained with internal tamponade.  Recommendations: 1. Dual antiplatelet therapy with aspirin and ticagrelor for at least 12 months. 2. Aggressive secondary prevention. 3. Transfer to [facility] for ICU monitoring, given focal perforation of SVG-RCA.  If the patient were to become hemodynamically unstable or develop worsening anemia, repeat angiography of SVG-RCA and possible covered stent placement would need to be considered.  Patient Profile     78 y.o. male with a history of CAD s/p CABG, DM, HTN, HLD, CKD stage 4 who presented with an NSTEMI s/p cardiac catheterizaion complicated by perforated SVG to RCA graft who was  transferred from [facility] also with CKD stage 5 requiring HD.  Assessment & Plan    1.  Multivessel CAD status post CABG with recent NSTEMI and DES intervention to the SVG to RCA complicated by contained perforation and clinically stable at this time on aspirin and Plavix.  He is not on beta-blockers due to intermittent hypotension.  No reported angina at this time.  2.  Persistent atrial flutter with variable conduction and controlled heart rate in the absence of AV nodal blockers.  Coumadin has been resumed with follow-up by pharmacy.  He is on heparin bridge with INR 1.3.  3.  Intermittent orthostasis and hypotension (most prominently at nighttime).  Not clearly symptomatic when this occurs, lactate drawn during these episodes within normal range as well.  Midodrine has been increased to 10 mg 3 times daily.  Also starting Florinef.  4.  CKD stage V requiring intermittent hemodialysis.  He underwent placement of left upper extremity AV fistula, has a right IJ catheter intact as well.  He is followed by the nephrology service.  Dialysis has been limited by low blood pressure however.  Continue aspirin, Plavix, and Lipitor.  He remains on heparin bridge with resumption of Coumadin, INR 1.3 today.  Continues to have issues with hypotension resulting in recent increase in midodrine, also starting Florinef.  May need to evaluate adrenal axis is this remains an issue.  He is deconditioned and has not been able to work with PT much as yet.  Signed, Rozann Lesches, MD  08/22/2019, 9:23 AM

## 2019-08-22 NOTE — Progress Notes (Signed)
Report received from primary nurse pt. bp still low. Notified Dr. Carolin Sicks will see pt. At bedside

## 2019-08-22 NOTE — Progress Notes (Signed)
ANTICOAGULATION CONSULT NOTE - Follow Up Consult  Pharmacy Consult for heparin Indication: atrial fibrillation (CHADS2VASc = 7)  Allergies  Allergen Reactions  . Ace Inhibitors Other (See Comments)    Reaction:  Raises potassium   . Quinapril Rash and Other (See Comments)    hyperkalemia    Patient Measurements: Height: 6\' 2"  (188 cm) Weight: 197 lb 1.5 oz (89.4 kg) IBW/kg (Calculated) : 82.2 Heparin Dosing Weight: 90.7 kg  Vital Signs: Temp: 98 F (36.7 C) (11/08 0604) Temp Source: Oral (11/08 0604) BP: 108/38 (11/08 0818) Pulse Rate: 64 (11/08 0818)  Labs: Recent Labs    08/20/19 0242 08/20/19 0802 08/21/19 0227 08/21/19 0719 08/22/19 0318  HGB 9.1*  --  8.3*  --  8.4*  HCT 28.7*  --  27.2*  --  27.6*  PLT 205  --  184  --  193  LABPROT 17.9*  --  16.7*  --  15.9*  INR 1.5*  --  1.4*  --  1.3*  HEPARINUNFRC  --  0.40  --  0.30 0.37  CREATININE 8.07*  --  8.99*  --  10.09*    Estimated Creatinine Clearance: 6.9 mL/min (A) (by C-G formula based on SCr of 10.09 mg/dL (H)).   Assessment: 78 yo M started on heparin for NSTEMI, s/p PCI with DES to SVG-RCA on 10/23 and continued for Afib (CHADS2VASC = 7). No anticoagulation prior to admission. Warfarin was started 10/29, then held starting 11/2 for AV Fistula placement. While warfarin was being held for AV fistula placement and patient was started on IV heparin. AV fistula was placed on 11/6 and vascular recommended resuming warfarin.   Pharmacy has been consulted for warfarin and heparin dosing for Afib. Heparin level of 0.37 is therapeutic on heparin 1100 units/hr. INR 1.3 is subtherapeutic after first dose of warfarin. Hgb 8.3. Plt 184. No reported bleeding.   Goal of Therapy:  INR 2-3  Heparin level 0.3-0.7 units/ml Monitor platelets by anticoagulation protocol: Yes  Plan:  Warfarin 5mg  x1 tonight Continue heparin 1100 units/hr Monitor daily INR, heparin level, CBC, S/S bleeding Will follow up cardiology  recommendations on heparin bridge with plan to continue heparin for now given high CHAD2-VASc score and subtherapeutic INR  Cristela Felt, PharmD PGY1 Pharmacy Resident [employer]: [phone]  08/22/2019 8:33 AM

## 2019-08-22 NOTE — Plan of Care (Signed)

## 2019-08-22 NOTE — Progress Notes (Signed)
[facility] NEPHROLOGY PROGRESS NOTE  Assessment/ Plan: Pt is a 78 y.o. yo male  W/ pmh of CAD s/p CABG, DM, HTN, HLD, CKD IV, who presented w/ NSTEMI, now s/p cardiac cath complicated by perforated SVG to RCA graft  # NSTEMI/CAD: s/p cardiac cath with perforation.  Cardiology following. Currently on DAPT.   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  No obstruction on renal u/s. Required CRRT, which was stopped 10/29. On MWF HD schedule. Vascular surgery placed [facility] and AV fistula Friday. Has not had dialysis since 11/4 d/t hypotension.  - continue to try HD as BP allows.  - increased midodrine to 10mg  TID.  Florinef started.  - renal navigator making arrangements for outpatient HD  #Hypotension: Hypotensive all day yesterday. Cardiology was called last night for hypotensive episode. Additional dose midodrine given - midodrine 10mg  TID - florinef started  # new onset a fib/flutter: off beta blocker.  - aspirin, plavix  #anemia: iron sat 20%. IV iron and ESA during dialysis.  Monitor CBC. hgb stable.   # Secondary hyperparathyroidism: phosphorus elevated. Should improve with more dialysis. PTH elevated at 535,, consistent with secondary hyperPTH.  - sevelamer 800 TID   Subjective:  Patient feels well today. No SOB or CP. No complaints.   Objective Physical Exam: General:NAD, comfortable Heart:RRR, s1s2 nl. No JVD, no hepatojugular reflux.  Lungs:clear b/l, bibasilar crackles heard Abdomen:soft, Non-tender, non-distended Extremities:No edema Dialysis Access: tunneled HD cath, right IJ Vital signs in last 24 hours: Vitals:   08/22/19 0400 08/22/19 0500 08/22/19 0604 08/22/19 0635  BP: (!) 94/33  (!) 69/47 (!) 82/38  Pulse: (!) 58  69 60  Resp: 16  20 17   Temp:   98 F (36.7 C)   TempSrc:   Oral   SpO2: 95%  96% 92%  Weight:  89.4 kg    Height:       Weight change: -0.2 kg  Intake/Output Summary (Last 24 hours) at  08/22/2019 0728 Last data filed at 08/22/2019 9326 Gross per 24 hour  Intake 1042.66 ml  Output 500 ml  Net 542.66 ml       Labs: Basic Metabolic Panel: Recent Labs  Lab 08/20/19 0242 08/21/19 0227 08/22/19 0318  NA 138 137 139  K 3.9 4.4 4.4  CL 101 103 105  CO2 23 21* 19*  GLUCOSE 144* 162* 127*  BUN 37* 45* 53*  CREATININE 8.07* 8.99* 10.09*  CALCIUM 8.5* 7.8* 8.3*  PHOS 5.8* 6.4* 7.1*   Liver Function Tests: Recent Labs  Lab 08/20/19 0242 08/21/19 0227 08/22/19 0318  ALBUMIN 2.2* 2.1* 2.1*   No results for input(s): LIPASE, AMYLASE in the last 168 hours. No results for input(s): AMMONIA in the last 168 hours. CBC: Recent Labs  Lab 08/16/19 0711 08/17/19 0324 08/20/19 0242 08/21/19 0227 08/22/19 0318  WBC 12.2* 12.9* 13.7* 12.9* 11.1*  HGB 8.7* 8.9* 9.1* 8.3* 8.4*  HCT 26.9* 27.2* 28.7* 27.2* 27.6*  MCV 100.7* 100.4* 103.6* 106.7* 108.7*  PLT 140* 146* 205 184 193   Cardiac Enzymes: No results for input(s): CKTOTAL, CKMB, CKMBINDEX, TROPONINI in the last 168 hours. CBG: Recent Labs  Lab 08/21/19 0622 08/21/19 1245 08/21/19 1615 08/21/19 2106 08/22/19 0638  GLUCAP 133* 222* 231* 166* 124*    Iron Studies: No results for input(s): IRON, TIBC, TRANSFERRIN, FERRITIN in the last 72 hours. Studies/Results: Dg Chest Port 1 View  Result Date: 08/20/2019 CLINICAL DATA:  Central line placement  EXAM: PORTABLE CHEST 1 VIEW COMPARISON:  08/12/2019 FINDINGS: There has been interval replacement of a large-bore right neck multi lumen vascular catheter, tip advanced now near the superior cavoatrial junction. Otherwise unchanged examination with cardiomegaly status post median sternotomy and without acute abnormality of the lungs AP portable projection. IMPRESSION: There has been interval replacement of a large-bore right neck multi lumen vascular catheter, tip advanced now near the superior cavoatrial junction. Electronically Signed   By: Eddie Candle M.D.   On:  08/20/2019 16:33   Dg Fluoro Guide Cv Line-no Report  Result Date: 08/20/2019 Fluoroscopy was utilized by the requesting physician.  No radiographic interpretation.    Medications: Infusions: . ferric gluconate (FERRLECIT/NULECIT) IV Stopped (08/18/19 1709)  . heparin 1,100 Units/hr (08/22/19 4076)  . lactated ringers 10 mL/hr at 08/20/19 8088    Scheduled Medications: . aspirin EC  81 mg Oral Daily  . atorvastatin  80 mg Oral q1800  . calcitRIOL  0.5 mcg Oral Daily  . Chlorhexidine Gluconate Cloth  6 each Topical Daily  . Chlorhexidine Gluconate Cloth  6 each Topical Q0600  . Chlorhexidine Gluconate Cloth  6 each Topical Q0600  . clopidogrel  75 mg Oral Daily  . darbepoetin (ARANESP) injection - DIALYSIS  100 mcg Intravenous Q Wed-HD  . feeding supplement (NEPRO CARB STEADY)  237 mL Oral TID BM  . insulin aspart  0-15 Units Subcutaneous TID WC  . insulin aspart  0-5 Units Subcutaneous QHS  . insulin glargine  15 Units Subcutaneous BID  . levothyroxine  300 mcg Oral Q0600  . midodrine  5 mg Oral TID WC  . multivitamin  1 tablet Oral QHS  . pantoprazole  40 mg Oral Daily  . sevelamer carbonate  800 mg Oral TID WC  . sodium chloride flush  10-40 mL Intracatheter Q12H  . Warfarin - Pharmacist Dosing Inpatient   Does not apply q1800    have reviewed scheduled and prn medications.   Benay Pike 08/22/2019,7:28 AM  LOS: 16 days  Pager: [phone]

## 2019-08-22 NOTE — Progress Notes (Signed)
   08/22/19 2000 08/22/19 2050  Vitals  BP (!) 88/54 (awake and alert) (!) 88/53  MAP (mmHg) 66 65  BP Location Right Arm  --   BP Method Automatic  --   Patient Position (if appropriate) Lying  --   Pulse Rate 60 (!) 59  Pulse Rate Source Monitor  --   ECG Heart Rate 64 60   8pm vitals above; BPs have been soft the past few days per chart. Pt is asleep upon entering room, but awakens easily to voice and is AOx4. Took meds fine. MAP 65-66. Will continue to monitor for any symptomatic changes in pt w/ low BPs.

## 2019-08-23 LAB — CBC
HCT: 27 % — ABNORMAL LOW (ref 39.0–52.0)
Hemoglobin: 8.4 g/dL — ABNORMAL LOW (ref 13.0–17.0)
MCH: 33.1 pg (ref 26.0–34.0)
MCHC: 31.1 g/dL (ref 30.0–36.0)
MCV: 106.3 fL — ABNORMAL HIGH (ref 80.0–100.0)
Platelets: 166 10*3/uL (ref 150–400)
RBC: 2.54 MIL/uL — ABNORMAL LOW (ref 4.22–5.81)
RDW: 16.8 % — ABNORMAL HIGH (ref 11.5–15.5)
WBC: 10.5 10*3/uL (ref 4.0–10.5)
nRBC: 0.3 % — ABNORMAL HIGH (ref 0.0–0.2)

## 2019-08-23 LAB — MAGNESIUM: Magnesium: 2.3 mg/dL (ref 1.7–2.4)

## 2019-08-23 LAB — RENAL FUNCTION PANEL
Albumin: 2 g/dL — ABNORMAL LOW (ref 3.5–5.0)
Anion gap: 15 (ref 5–15)
BUN: 58 mg/dL — ABNORMAL HIGH (ref 8–23)
CO2: 18 mmol/L — ABNORMAL LOW (ref 22–32)
Calcium: 8.5 mg/dL — ABNORMAL LOW (ref 8.9–10.3)
Chloride: 105 mmol/L (ref 98–111)
Creatinine, Ser: 10.34 mg/dL — ABNORMAL HIGH (ref 0.61–1.24)
GFR calc Af Amer: 5 mL/min — ABNORMAL LOW (ref >60)
GFR calc non Af Amer: 4 mL/min — ABNORMAL LOW (ref >60)
Glucose, Bld: 107 mg/dL — ABNORMAL HIGH (ref 70–99)
Phosphorus: 7.1 mg/dL — ABNORMAL HIGH (ref 2.5–4.6)
Potassium: 4.4 mmol/L (ref 3.5–5.1)
Sodium: 138 mmol/L (ref 135–145)

## 2019-08-23 LAB — GLUCOSE, CAPILLARY
Glucose-Capillary: 93 mg/dL (ref 70–99)
Glucose-Capillary: 259 mg/dL — ABNORMAL HIGH (ref 70–99)
Glucose-Capillary: 96 mg/dL (ref 70–99)

## 2019-08-23 LAB — PROTIME-INR
INR: 2.1 — ABNORMAL HIGH (ref 0.8–1.2)
Prothrombin Time: 23.5 seconds — ABNORMAL HIGH (ref 11.4–15.2)

## 2019-08-23 LAB — HEPARIN LEVEL (UNFRACTIONATED): Heparin Unfractionated: 0.34 IU/mL (ref 0.30–0.70)

## 2019-08-23 MED ORDER — ALBUMIN HUMAN 5 % IV SOLN: 25.0000 g | Freq: Once | INTRAVENOUS | Status: DC

## 2019-08-23 MED ORDER — ALBUMIN HUMAN 25 % IV SOLN: 12.5000 g | Freq: Once | INTRAVENOUS | Status: DC

## 2019-08-23 MED ORDER — ALBUMIN HUMAN 25 % IV SOLN
25.0000 g | Freq: Once | INTRAVENOUS | Status: AC
  Administered 2019-08-23: 15:00:00 25 g via INTRAVENOUS

## 2019-08-23 MED ORDER — HEPARIN SODIUM (PORCINE) 1000 UNIT/ML IJ SOLN
INTRAMUSCULAR | Status: AC
  Administered 2019-08-23: 3400 [IU] via INTRAVENOUS_CENTRAL
  Filled 2019-08-23: qty 4

## 2019-08-23 MED ORDER — ALBUMIN HUMAN 25 % IV SOLN
INTRAVENOUS | Status: AC
  Administered 2019-08-23: 25 g via INTRAVENOUS
  Filled 2019-08-23: qty 100

## 2019-08-23 MED ORDER — MIDODRINE HCL 5 MG PO TABS
ORAL_TABLET | ORAL | Status: AC
  Administered 2019-08-23: 10 mg via ORAL
  Filled 2019-08-23: qty 2

## 2019-08-23 MED ORDER — WARFARIN SODIUM 2.5 MG PO TABS: 2.5000 mg | ORAL_TABLET | Freq: Once | ORAL | Status: DC

## 2019-08-23 MED ORDER — ALBUMIN HUMAN 25 % IV SOLN
INTRAVENOUS | Status: AC
  Filled 2019-08-23: qty 100

## 2019-08-23 MED ORDER — ALBUMIN HUMAN 25 % IV SOLN
25.0000 g | Freq: Once | INTRAVENOUS | Status: AC
  Administered 2019-08-23: 25 g via INTRAVENOUS

## 2019-08-23 NOTE — Progress Notes (Signed)
[facility] NEPHROLOGY PROGRESS NOTE  Assessment/ Plan: Pt is a 78 y.o. yo male  W/ pmh of CAD s/p CABG, DM, HTN, HLD, CKD IV, who presented w/ NSTEMI, now s/p cardiac cath complicated by perforated SVG to RCA graft  # NSTEMI/CAD: s/p cardiac cath with perforation.  Cardiology following. Currently on DAPT.   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  No obstruction on renal u/s. Required CRRT, which was stopped 10/29. On MWF HD schedule. Vascular surgery placed [facility] and AV fistula Friday. Has not had dialysis since 11/4 d/t hypotension. Bicarb low, phos elevated, other electrolytes wnl.   - continue to try HD as BP allows.  - increased midodrine to 10mg  TID.  Florinef started.  - renal navigator making arrangements for outpatient HD  #Hypotension: Hypotensive all weekend. Documented readings are low but when I examined him most recent recorded BP was >845 systolic. Having dizziness upon standing and DOE. interfering with ability to perform dialysis.  - midodrine 10mg  TID - florinef started  # new onset a fib/flutter: off beta blocker.  - aspirin, plavix - managed per cardiology  #anemia: iron sat 20%. IV iron and ESA during dialysis.  Monitor CBC. hgb stable.   # Secondary hyperparathyroidism: phosphorus elevated. Should improve with more dialysis. PTH elevated at 535,, consistent with secondary hyperPTH.  - sevelamer 800 TID   Subjective:  Patient has no new complaints.  States he feels 'so-so'.  He experiences SOB and dizziness when ambulating to chair. No symptoms when lying in chair or bed.   Objective Physical Exam: General:NAD, comfortable. Lying in bed.  Heart:RRR, s1s2 nl. No JVD, no hepatojugular reflux.  Lungs:clear b/l, bibasilar crackles heard Extremities:No edema Dialysis Access: tunneled HD cath, right IJ Vital signs in last 24 hours: Vitals:   08/23/19 0000 08/23/19 0010 08/23/19 0500 08/23/19 0530  BP: (!)  88/56 (!) 91/58 (!) 80/43 (!) 77/55  Pulse: 60 66 85   Resp: 17 16 20 17   Temp: 98 F (36.7 C)  97.9 F (36.6 C)   TempSrc: Oral  Oral   SpO2: 97% 97% 94%   Weight:   89.6 kg   Height:       Weight change: 0.2 kg  Intake/Output Summary (Last 24 hours) at 08/23/2019 0759 Last data filed at 08/23/2019 0600 Gross per 24 hour  Intake 1209.63 ml  Output 500 ml  Net 709.63 ml       Labs: Basic Metabolic Panel: Recent Labs  Lab 08/21/19 0227 08/22/19 0318 08/23/19 0300  NA 137 139 138  K 4.4 4.4 4.4  CL 103 105 105  CO2 21* 19* 18*  GLUCOSE 162* 127* 107*  BUN 45* 53* 58*  CREATININE 8.99* 10.09* 10.34*  CALCIUM 7.8* 8.3* 8.5*  PHOS 6.4* 7.1* 7.1*   Liver Function Tests: Recent Labs  Lab 08/21/19 0227 08/22/19 0318 08/23/19 0300  ALBUMIN 2.1* 2.1* 2.0*   No results for input(s): LIPASE, AMYLASE in the last 168 hours. No results for input(s): AMMONIA in the last 168 hours. CBC: Recent Labs  Lab 08/17/19 0324 08/20/19 0242 08/21/19 0227 08/22/19 0318 08/23/19 0300  WBC 12.9* 13.7* 12.9* 11.1* 10.5  HGB 8.9* 9.1* 8.3* 8.4* 8.4*  HCT 27.2* 28.7* 27.2* 27.6* 27.0*  MCV 100.4* 103.6* 106.7* 108.7* 106.3*  PLT 146* 205 184 193 166   Cardiac Enzymes: No results for input(s): CKTOTAL, CKMB, CKMBINDEX, TROPONINI in the last 168 hours. CBG: Recent Labs  Lab 08/22/19  8833 08/22/19 1135 08/22/19 1640 08/22/19 2105 08/23/19 0620  GLUCAP 124* 240* 139* 109* 93    Iron Studies: No results for input(s): IRON, TIBC, TRANSFERRIN, FERRITIN in the last 72 hours. Studies/Results: No results found.  Medications: Infusions: . ferric gluconate (FERRLECIT/NULECIT) IV Stopped (08/18/19 1709)  . heparin 1,100 Units/hr (08/23/19 0535)  . lactated ringers 10 mL/hr at 08/20/19 7445    Scheduled Medications: . aspirin EC  81 mg Oral Daily  . atorvastatin  80 mg Oral q1800  . calcitRIOL  0.5 mcg Oral Daily  . Chlorhexidine Gluconate Cloth  6 each Topical Q0600  .  clopidogrel  75 mg Oral Daily  . darbepoetin (ARANESP) injection - DIALYSIS  100 mcg Intravenous Q Wed-HD  . feeding supplement (NEPRO CARB STEADY)  237 mL Oral TID BM  . fludrocortisone  0.1 mg Oral Daily  . insulin aspart  0-15 Units Subcutaneous TID WC  . insulin aspart  0-5 Units Subcutaneous QHS  . insulin glargine  15 Units Subcutaneous BID  . levothyroxine  300 mcg Oral Q0600  . midodrine  10 mg Oral TID WC  . multivitamin  1 tablet Oral QHS  . pantoprazole  40 mg Oral Daily  . sevelamer carbonate  800 mg Oral TID WC  . sodium chloride flush  10-40 mL Intracatheter Q12H  . Warfarin - Pharmacist Dosing Inpatient   Does not apply q1800    have reviewed scheduled and prn medications.   Benay Pike 08/23/2019,7:59 AM  LOS: 17 days  Pager: [phone]

## 2019-08-23 NOTE — Progress Notes (Addendum)
Progress Note  Patient Name: [name] Date of Encounter: 08/23/2019  Primary Cardiologist: Corey Skains, MD   Subjective   Pressures are still low. Still asymptomatic. Denies CP or SOB.   Inpatient Medications    Scheduled Meds: . aspirin EC  81 mg Oral Daily  . atorvastatin  80 mg Oral q1800  . calcitRIOL  0.5 mcg Oral Daily  . Chlorhexidine Gluconate Cloth  6 each Topical Q0600  . clopidogrel  75 mg Oral Daily  . darbepoetin (ARANESP) injection - DIALYSIS  100 mcg Intravenous Q Wed-HD  . feeding supplement (NEPRO CARB STEADY)  237 mL Oral TID BM  . fludrocortisone  0.1 mg Oral Daily  . insulin aspart  0-15 Units Subcutaneous TID WC  . insulin aspart  0-5 Units Subcutaneous QHS  . insulin glargine  15 Units Subcutaneous BID  . levothyroxine  300 mcg Oral Q0600  . midodrine  10 mg Oral TID WC  . multivitamin  1 tablet Oral QHS  . pantoprazole  40 mg Oral Daily  . sevelamer carbonate  800 mg Oral TID WC  . sodium chloride flush  10-40 mL Intracatheter Q12H  . warfarin  2.5 mg Oral ONCE-1800  . Warfarin - Pharmacist Dosing Inpatient   Does not apply q1800   Continuous Infusions: . ferric gluconate (FERRLECIT/NULECIT) IV Stopped (08/18/19 1709)  . heparin 1,100 Units/hr (08/23/19 0535)  . lactated ringers 10 mL/hr at 08/20/19 0928   PRN Meds: acetaminophen, albuterol, food thickener, guaiFENesin, heparin, loperamide, nitroGLYCERIN, ondansetron (ZOFRAN) IV, oxyCODONE-acetaminophen, Resource ThickenUp Clear, sodium chloride, sodium chloride flush   Vital Signs    Vitals:   08/23/19 0500 08/23/19 0530 08/23/19 0822 08/23/19 0924  BP: (!) 80/43 (!) 77/55 (!) 73/46   Pulse: 85  60 62  Resp: 20 17 20 20   Temp: 97.9 F (36.6 C)  97.8 F (36.6 C)   TempSrc: Oral  Oral   SpO2: 94%  98% 94%  Weight: 89.6 kg     Height:        Intake/Output Summary (Last 24 hours) at 08/23/2019 0927 Last data filed at 08/23/2019 0600 Gross per 24 hour  Intake 721.72 ml   Output 500 ml  Net 221.72 ml   Last 3 Weights 08/23/2019 08/22/2019 08/21/2019  Weight (lbs) 197 lb 8.5 oz 197 lb 1.5 oz 209 lb 10.5 oz  Weight (kg) 89.6 kg 89.4 kg 95.1 kg      Telemetry    Afib, HR in the 60s; Frequent PVCs - Personally Reviewed  ECG    No new - Personally Reviewed  Physical Exam   GEN: No acute distress; deconditioned   Neck: No JVD Cardiac: RRR, no murmurs, rubs, or gallops.  Respiratory: crackles at the bases. GI: Soft, nontender, non-distended  MS: No edema; No deformity. Neuro:  Nonfocal  Psych: Normal affect   Labs    High Sensitivity Troponin:   Recent Labs  Lab 08/04/19 2250 08/05/19 0043 08/05/19 0818 08/05/19 1624  TROPONINIHS 471* 507* 1,266* 1,123*      Chemistry Recent Labs  Lab 08/21/19 0227 08/22/19 0318 08/23/19 0300  NA 137 139 138  K 4.4 4.4 4.4  CL 103 105 105  CO2 21* 19* 18*  GLUCOSE 162* 127* 107*  BUN 45* 53* 58*  CREATININE 8.99* 10.09* 10.34*  CALCIUM 7.8* 8.3* 8.5*  ALBUMIN 2.1* 2.1* 2.0*  GFRNONAA 5* 4* 4*  GFRAA 6* 5* 5*  ANIONGAP 13 15 15      Hematology  Recent Labs  Lab 08/21/19 0227 08/22/19 0318 08/23/19 0300  WBC 12.9* 11.1* 10.5  RBC 2.55* 2.54* 2.54*  HGB 8.3* 8.4* 8.4*  HCT 27.2* 27.6* 27.0*  MCV 106.7* 108.7* 106.3*  MCH 32.5 33.1 33.1  MCHC 30.5 30.4 31.1  RDW 16.1* 16.5* 16.8*  PLT 184 193 166    BNPNo results for input(s): BNP, PROBNP in the last 168 hours.   DDimer No results for input(s): DDIMER in the last 168 hours.   Radiology    No results found.  Cardiac Studies   Echocardiogram 08/07/2019: 1. Left ventricular ejection fraction, by visual estimation, is 60 to 65%. The left ventricle has normal function. Normal left ventricular size. There is no left ventricular hypertrophy. 2. Global right ventricle has normal systolic function.The right ventricular size is normal. No increase in right ventricular wall thickness. 3. Left atrial size was normal. 4. Right atrial  size was norma 5. Severe mitral annular calcification. Mild calcification of the anterior mitral valve leaflet(s).. No evidence of mitral valve regurgitation. Mild mitral stenosis. 6. The tricuspid valve is normal in structure. Tricuspid valve regurgitation is mild. 7. The aortic valve is tricuspid Aortic valve regurgitation is mild by color flow Doppler. Moderate aortic valve sclerosis/calcification without any evidence of aortic stenosis. 8. The pulmonic valve was normal in structure. Pulmonic valve regurgitation is not visualized by color flow Doppler. 9. Normal pulmonary artery systolic pressure. 10. The inferior vena cava is normal in size with greater than 50% respiratory variability, suggesting right atrial pressure of 3 mmHg. 11. No evidence of pericardial effusion.  Cardiac catheterization 08/06/2019:  Ost RCA to Prox RCA lesion is 100% stenosed.  Ost Cx lesion is 75% stenosed.  Ramus lesion is 75% stenosed.  Dist LM to Prox LAD lesion is 85% stenosed.  Mid Graft lesion is 85% stenosed.  Origin lesion is 100% stenosed.  78 year old male with hypertension hyperlipidemia and known coronary artery bypass surgery in the past with acute on chronic kidney disease needing dialysis treatment. The patient has acute on this set of chest discomfort intermittent over the last 48 hours with elevated troponin consistent with non-ST elevation myocardial infarction.  Left ventricle not injected due to kidney disease  100% stenosis of right coronary artery ostium Distal left main coronary atherosclerosis of 75 to 80% to trifurcation of left anterior descending artery, circumflex artery, and ramus artery Patent mammary graft to the left anterior descending artery Occluded saphenous vein graft to obtuse marginal 1 Patent but significant stenosis and thrombosed proximal graft to right coronary artery  Plan PCI stent placement of graft to right coronary artery Dual antiplatelet  therapy Heparin Continue high intensity cholesterol therapy No change in medication management for hypertension control  PCI 08/06/2019: Conclusions: 1. See diagnostic angiogram by Dr. Nehemiah Massed for details of coronary/graft anatomy. 2. Severe disease involving SVG-RCA with 80-90% stenosis. 3. Successful PCI to SVG-RCA using Resolute Onyx 4.0 x 38 mm drug-eluting stent with 10% residual stenosis and TIMI-3 flow. 4. Focal perforation of the SVG-RCA during stent deployment. This was successfully contained with internal tamponade.  Recommendations: 1. Dual antiplatelet therapy with aspirin and ticagrelor for at least 12 months. 2. Aggressive secondary prevention. 3. Transfer to [facility] for ICU monitoring, given focal perforation of SVG-RCA. If the patient were to become hemodynamically unstable or develop worsening anemia, repeat angiography of SVG-RCA and possible covered stent placement would need to be considered.   Patient Profile     78 y.o. male with a history  of CAD s/p CABG, DM, HTN, HLD, CKD stage 4 who presented with an NSTEMI s/p cardiac catheterizaion complicated by perforated SVG to RCA graft who was transferred from [place] also with CKD stage 5 requiring HD  Assessment & Plan    Multivessel CAD s/p CABG with recent NSTEMI and DES intervention to the SVG to RCA (complicated by perforation) - continue ASA and Plavix - Not on BB due to intermittent hypotension - No recurrent CP or SOB - continue Lipitor  Persistent Atrial flutter with variable conduction - HR have been controlled - CHADSVASC = 7 - Coumadin resumed per pharmacy - Currently on heparin bridge  Orthostatic Hypotension - predominately worse at nighttime. 82/54 this AM in the right arm. Left with AV fistula - Mostly asymptomatic - Midodrine was increased to 10 mg TID - Florinef was started, due for dose this AM>> monitor response  CKD stage 5 with intermittent HD - underwent placement  of AV fistula, has right IJ catheter - Nephrology following  For questions or updates, please contact [facility] Please consult [email or web address] for contact info under     Signed, Cadence Ninfa Meeker, PA-C  08/23/2019, 9:27 AM    The patient was seen, examined and discussed with Cadence Ninfa Meeker, PA-C   and I agree with the above.   The patient seems to be deconditioned, BP at rest 140 mmHg, tele shows rate controlled atrial flutter with VR 60-70'. We will arrange transfer to inpatient rehab, hopefully tomorrow.  Ena Dawley, MD 08/23/2019

## 2019-08-23 NOTE — Progress Notes (Signed)
ANTICOAGULATION CONSULT NOTE - Follow Up Consult  Pharmacy Consult for heparin/warfarin Indication: atrial fibrillation (CHADS2VASc = 7)  Allergies  Allergen Reactions  . Ace Inhibitors Other (See Comments)    Reaction:  Raises potassium   . Quinapril Rash and Other (See Comments)    hyperkalemia    Patient Measurements: Height: 6\' 2"  (188 cm) Weight: 197 lb 8.5 oz (89.6 kg) IBW/kg (Calculated) : 82.2 Heparin Dosing Weight: 90.7 kg  Vital Signs: Temp: 97.9 F (36.6 C) (11/09 0500) Temp Source: Oral (11/09 0500) BP: 77/55 (11/09 0530) Pulse Rate: 85 (11/09 0500)  Labs: Recent Labs    08/21/19 0227 08/21/19 0719 08/22/19 0318 08/23/19 0300  HGB 8.3*  --  8.4* 8.4*  HCT 27.2*  --  27.6* 27.0*  PLT 184  --  193 166  LABPROT 16.7*  --  15.9* 23.5*  INR 1.4*  --  1.3* 2.1*  HEPARINUNFRC  --  0.30 0.37 0.34  CREATININE 8.99*  --  10.09* 10.34*    Estimated Creatinine Clearance: 6.7 mL/min (A) (by C-G formula based on SCr of 10.34 mg/dL (H)).   Assessment: 78 yo M started on heparin for NSTEMI, s/p PCI with DES to SVG-RCA on 10/23 and continued for Afib (CHADS2VASC = 7). No anticoagulation prior to admission. Warfarin was started 10/29, then held starting 11/2 for AV Fistula placement. While warfarin was being held for AV fistula placement and patient was started on IV heparin. AV fistula was placed on 11/6 and vascular recommended resuming warfarin.   Heparin level therapeutic at 0.34, INR now lower end therapeutic at 2.1.  CBC stable.   Goal of Therapy:  INR 2-3  Heparin level 0.3-0.7 units/ml Monitor platelets by anticoagulation protocol: Yes  Plan:  Warfarin 2.5 mg x1 tonight Continue heparin 1100 units/hr F/u need for bridge continuation with therapeutic INR Daily INR, heparin level, CBC, s/s bleeding   Bertis Ruddy, PharmD Clinical Pharmacist Please check AMION for all [place] numbers 08/23/2019 8:21 AM

## 2019-08-23 NOTE — Care Management Important Message (Signed)
Important Message  Patient Details  Name: [name] MRN: [id number] Date of Birth: August 30, 1941   Medicare Important Message Given:  Yes     Shelda Altes 08/23/2019, 12:38 PM

## 2019-08-23 NOTE — Progress Notes (Signed)
   08/23/19 0500  Vitals  Temp 97.9 F (36.6 C)  Temp Source Oral  BP (!) 80/43 (cuff re-adjusted, pt awake and alert)  MAP (mmHg) (!) 54  BP Location Right Arm  BP Method Automatic  Patient Position (if appropriate) Lying  Pulse Rate 85  Pulse Rate Source Monitor  ECG Heart Rate 61  Resp 20  Oxygen Therapy  SpO2 94 %  O2 Device Room Air  MEWS Score  MEWS RR 0  MEWS Pulse 0  MEWS Systolic 2  MEWS LOC 0  MEWS Temp 0  MEWS Score 2  MEWS Score Color Yellow  MEWS Assessment  Is this an acute change? No  Note  Observations woken for vitals, arouses easily   Pt was awoken for vitals, auto cuff pressure from 4am was off as patient was curled up in bed with arms bent. Straightened arm out and got the VS as shown above. Pt is AOx4, woke up easily to voice, and denies SOB, chest pain, dizziness, etc. Just complains of some back pain from lying in bed and helped reposition. Aflutter 60s on monitor, no changes. Will continue to monitor.

## 2019-08-23 NOTE — Progress Notes (Signed)
Physical Therapy Treatment Note  Patient seen for mobility progression. Pt in bed upon arrival and agreeable to participate despite feeling fatigued and c/o R UE/chest pain. Pt with hypotension and  RN aware and cleared pt to participate and reports pt transferred to [facility] earlier in the day. RN reports improve BP when taken in L UE vs R UE. BP monitored during  session using R UE (L UE restricted). Pt requires Mod/Max A +2 for bed mobility and transfers this session and able to take a few sidestep toward [place]. Pt will  continue to benefit from further skilled PT services in both acute and post acute settings to maximize independence and safety with mobility.   BP: supine 76/46 (56), supine after exercises 72/50 (57), EOB 74/58 (64), supine after activity 67/42 (49)--RN aware and in room    08/23/19 1631  PT Visit Information  Last PT Received On 08/23/19  Assistance Needed +2  PT/OT/SLP Co-Evaluation/Treatment Yes  Reason for Co-Treatment Complexity of the patient's impairments (multi-system involvement);For patient/therapist safety;To address functional/ADL transfers  PT goals addressed during session Mobility/safety with mobility  History of Present Illness Pt is a 78 year old gentleman with a history of coronary artery status post CABG 1997, diabetes, hypertension, hyperlipidemia, chronic kidney disease stage IV at baseline who presents with non-ST segment elevated MI I s/p cardiac catheterization stent complicated by perforation.  Transferred to [facility] 08/09/2019.  Acute on chronic kidney injury sustained requiring CRRT 08/09/2019-08/12/2019.  Subjective Data  Patient Stated Goal "get better."  Precautions  Precautions Fall  Precaution Comments orthostatic  Restrictions  Weight Bearing Restrictions No  Pain Assessment  Pain Assessment Faces  Faces Pain Scale 6  Pain Location R shoulder   Pain Descriptors / Indicators Discomfort  Pain Intervention(s) Limited activity within  patient's tolerance;Monitored during session;Repositioned  Cognition  Arousal/Alertness Awake/alert  Behavior During Therapy WFL for tasks assessed/performed  Overall Cognitive Status Within Functional Limits for tasks assessed  General Comments appears WFL, following commands and aware of deficits   Bed Mobility  Overal bed mobility Needs Assistance  Bed Mobility Supine to Sit;Sit to Supine  Supine to sit Mod assist;+2 for physical assistance  Sit to supine Max assist;+2 for physical assistance  General bed mobility comments pt requires support for trunk and hips to EOB, returned to supine with support for trunk and LEs   Transfers  Overall transfer level Needs assistance  Equipment used 2 person hand held assist  Transfers Sit to/from Stand  Sit to Stand Mod assist;Max assist;+2 physical assistance;+2 safety/equipment  General transfer comment mod assist +2 to power up and max assist +2 to maintain upright position due to weakness   Ambulation/Gait  Ambulation/Gait assistance Mod assist;Min assist;+2 physical assistance;+2 safety/equipment  Assistive device 2 person hand held assist  Gait Pattern/deviations Step-to pattern;Trunk flexed  General Gait Details cues for upright posture with assistance to facilitate hip extension; pt fatigues quickly; able to take a few side steps toward [facility]  Balance  Overall balance assessment Needs assistance  Sitting-balance support No upper extremity supported;Feet supported  Sitting balance-Leahy Scale Fair  Sitting balance - Comments min guard to min assist  Standing balance support Bilateral upper extremity supported;During functional activity  Standing balance-Leahy Scale Poor  Standing balance comment relaint on BUE and external support  General Comments  General comments (skin integrity, edema, etc.) BP monitored throughout session; RN reports BP is higher when take on L UE vs R UE; BP not taken in L UE during session  due to restricted  extremity   Exercises  Exercises General Lower Extremity  General Exercises - Lower Extremity  Ankle Circles/Pumps AROM;Both;20 reps;Supine  Heel Slides AROM;Both;10 reps;Supine  PT - End of Session  Equipment Utilized During Treatment Gait belt  Activity Tolerance Patient limited by fatigue;Other (comment) (limited by soft BP)  Patient left in bed;with call bell/phone within reach;with bed alarm set;with family/visitor present  Nurse Communication Mobility status   PT - Assessment/Plan  PT Plan Current plan remains appropriate  PT Visit Diagnosis Muscle weakness (generalized) (M62.81);Difficulty in walking, not elsewhere classified (R26.2)  PT Frequency (ACUTE ONLY) Min 3X/week  Recommendations for Other Services Rehab consult;OT consult  Follow Up Recommendations CIR;Supervision/Assistance - 24 hour  PT equipment Rolling walker with 5" wheels;3in1 (PT)  AM-PAC PT "6 Clicks" Mobility Outcome Measure (Version 2)  Help needed turning from your back to your side while in a flat bed without using bedrails? 3  Help needed moving from lying on your back to sitting on the side of a flat bed without using bedrails? 3  Help needed moving to and from a bed to a chair (including a wheelchair)? 2  Help needed standing up from a chair using your arms (e.g., wheelchair or bedside chair)? 2  Help needed to walk in hospital room? 1  Help needed climbing 3-5 steps with a railing?  1  6 Click Score 12  Consider Recommendation of Discharge To: CIR/SNF/LTACH  PT Goal Progression  Progress towards PT goals Progressing toward goals  PT Time Calculation  PT Start Time (ACUTE ONLY) 1153  PT Stop Time (ACUTE ONLY) 1230  PT Time Calculation (min) (ACUTE ONLY) 37 min  PT General Charges  $$ ACUTE PT VISIT 1 Visit  PT Treatments  $Gait Training 8-22 mins   Earney Navy, PTA Acute Rehabilitation Services Pager: [phone] Office: [phone]

## 2019-08-23 NOTE — Progress Notes (Signed)
Occupational Therapy Treatment Patient Details Name: [name] MRN: [id number] DOB: 1940-10-18 Today's Date: 08/23/2019    History of present illness Pt is a 78 year old gentleman with a history of coronary artery status post CABG 1997, diabetes, hypertension, hyperlipidemia, chronic kidney disease stage IV at baseline who presents with non-ST segment elevated MI I s/p cardiac catheterization stent complicated by perforation.  Transferred to [facility] 08/09/2019.  Acute on chronic kidney injury sustained requiring CRRT 08/09/2019-08/12/2019.   OT comments  Patient supine in bed and agreeable to OT/PT session, although hesitant due to fatigue and exhaustion.  BP monitored throughout session, RN aware of hypotension and cleared pt to participate (as reports BP improved when taken in L arm- restricted extremity, compared to R UE)--see below for details.  Pt requires mod-max assist +2 for bed mobility and transfers, min assist for grooming bed level.  Limited by hypotension and R shoulder pain today.  Updated dc plan to SNF, for longer term rehab due to poor tolerance.  Will follow acutely .  BP: supine 76/46 (56), supine after exercises 72/50 (57), EOB 74/58 (64), supine after activity 67/42 (49)--RN aware and in room   Follow Up Recommendations  SNF;Supervision/Assistance - 24 hour    Equipment Recommendations  Other (comment)(TBD at next venue of care)    Recommendations for Other Services      Precautions / Restrictions Precautions Precautions: Fall Precaution Comments: orthostatic Restrictions Weight Bearing Restrictions: No       Mobility Bed Mobility Overal bed mobility: Needs Assistance Bed Mobility: Supine to Sit;Sit to Supine     Supine to sit: Mod assist;+2 for physical assistance Sit to supine: Max assist;+2 for physical assistance   General bed mobility comments: pt requires support for trunk and hips to EOB, returned to supine with support for trunk and  LEs   Transfers Overall transfer level: Needs assistance Equipment used: 2 person hand held assist Transfers: Sit to/from Stand Sit to Stand: Mod assist;Max assist;+2 physical assistance;+2 safety/equipment         General transfer comment: mod assist +2 to power up and max assist +2 to maintain upright position due to weakness     Balance Overall balance assessment: Needs assistance Sitting-balance support: No upper extremity supported;Feet supported Sitting balance-Leahy Scale: Fair Sitting balance - Comments: min guard to min assist   Standing balance support: Bilateral upper extremity supported;During functional activity Standing balance-Leahy Scale: Poor Standing balance comment: relaint on BUE and external support                           ADL either performed or assessed with clinical judgement   ADL Overall ADL's : Needs assistance/impaired     Grooming: Set up;Wash/dry face;Wash/dry hands;Bed level   Upper Body Bathing: Bed level;Moderate assistance   Lower Body Bathing: Total assistance;+2 for physical assistance;Bed level   Upper Body Dressing : Sitting;Minimal assistance   Lower Body Dressing: Total assistance;+2 for physical assistance;Bed level     Toilet Transfer Details (indicate cue type and reason): NT due to safety         Functional mobility during ADLs: Maximal assistance;+2 for safety/equipment;Moderate assistance General ADL Comments: pt limited by hypotension     Vision       Perception     Praxis      Cognition Arousal/Alertness: Awake/alert Behavior During Therapy: WFL for tasks assessed/performed Overall Cognitive Status: Within Functional Limits for tasks assessed  General Comments: appears WFL, following commands and aware of deficits         Exercises     Shoulder Instructions       General Comments BP monitored throughout session    Pertinent Vitals/  Pain       Pain Assessment: Faces Faces Pain Scale: Hurts even more Pain Location: R shoulder  Pain Descriptors / Indicators: Discomfort Pain Intervention(s): Limited activity within patient's tolerance;Monitored during session;Repositioned  Home Living                                          Prior Functioning/Environment              Frequency  Min 2X/week        Progress Toward Goals  OT Goals(current goals can now be found in the care plan section)  Progress towards OT goals: Not progressing toward goals - comment(hypotension)  Acute Rehab OT Goals Patient Stated Goal: "get better." OT Goal Formulation: With patient  Plan Frequency remains appropriate;Discharge plan needs to be updated    Co-evaluation    PT/OT/SLP Co-Evaluation/Treatment: Yes Reason for Co-Treatment: Complexity of the patient's impairments (multi-system involvement);For patient/therapist safety;To address functional/ADL transfers   OT goals addressed during session: ADL's and self-care      AM-PAC OT "6 Clicks" Daily Activity     Outcome Measure   Help from another person eating meals?: A Little Help from another person taking care of personal grooming?: A Little Help from another person toileting, which includes using toliet, bedpan, or urinal?: A Lot Help from another person bathing (including washing, rinsing, drying)?: A Lot Help from another person to put on and taking off regular upper body clothing?: A Lot Help from another person to put on and taking off regular lower body clothing?: A Lot 6 Click Score: 14    End of Session Equipment Utilized During Treatment: Gait belt  OT Visit Diagnosis: Unsteadiness on feet (R26.81);Muscle weakness (generalized) (M62.81)   Activity Tolerance Treatment limited secondary to medical complications (Comment)(BP)   Patient Left in bed;with call bell/phone within reach;with bed alarm set   Nurse Communication Mobility status         Time: 0340-3524 OT Time Calculation (min): 37 min  Charges: OT General Charges $OT Visit: 1 Visit OT Treatments $Self Care/Home Management : 8-22 mins  Delight Stare, [place] Pager [phone] Office [place] 08/23/2019, 1:26 PM

## 2019-08-24 DIAGNOSIS — I951 Orthostatic hypotension: Secondary | ICD-10-CM

## 2019-08-24 LAB — MAGNESIUM: Magnesium: 1.9 mg/dL (ref 1.7–2.4)

## 2019-08-24 LAB — RENAL FUNCTION PANEL
Albumin: 2.4 g/dL — ABNORMAL LOW (ref 3.5–5.0)
Anion gap: 11 (ref 5–15)
BUN: 19 mg/dL (ref 8–23)
CO2: 25 mmol/L (ref 22–32)
Calcium: 8.2 mg/dL — ABNORMAL LOW (ref 8.9–10.3)
Chloride: 100 mmol/L (ref 98–111)
Creatinine, Ser: 5.03 mg/dL — ABNORMAL HIGH (ref 0.61–1.24)
GFR calc Af Amer: 12 mL/min — ABNORMAL LOW (ref >60)
GFR calc non Af Amer: 10 mL/min — ABNORMAL LOW (ref >60)
Glucose, Bld: 80 mg/dL (ref 70–99)
Phosphorus: 4 mg/dL (ref 2.5–4.6)
Potassium: 3.4 mmol/L — ABNORMAL LOW (ref 3.5–5.1)
Sodium: 136 mmol/L (ref 135–145)

## 2019-08-24 LAB — CBC
HCT: 23.4 % — ABNORMAL LOW (ref 39.0–52.0)
Hemoglobin: 7.3 g/dL — ABNORMAL LOW (ref 13.0–17.0)
MCH: 32.4 pg (ref 26.0–34.0)
MCHC: 31.2 g/dL (ref 30.0–36.0)
MCV: 104 fL — ABNORMAL HIGH (ref 80.0–100.0)
Platelets: 129 10*3/uL — ABNORMAL LOW (ref 150–400)
RBC: 2.25 MIL/uL — ABNORMAL LOW (ref 4.22–5.81)
RDW: 16.4 % — ABNORMAL HIGH (ref 11.5–15.5)
WBC: 7.8 10*3/uL (ref 4.0–10.5)
nRBC: 0.4 % — ABNORMAL HIGH (ref 0.0–0.2)

## 2019-08-24 LAB — HEPARIN LEVEL (UNFRACTIONATED): Heparin Unfractionated: 0.24 IU/mL — ABNORMAL LOW (ref 0.30–0.70)

## 2019-08-24 LAB — GLUCOSE, CAPILLARY
Glucose-Capillary: 71 mg/dL (ref 70–99)
Glucose-Capillary: 111 mg/dL — ABNORMAL HIGH (ref 70–99)
Glucose-Capillary: 141 mg/dL — ABNORMAL HIGH (ref 70–99)
Glucose-Capillary: 195 mg/dL — ABNORMAL HIGH (ref 70–99)

## 2019-08-24 LAB — PROTIME-INR
INR: 3.5 — ABNORMAL HIGH (ref 0.8–1.2)
Prothrombin Time: 34.2 seconds — ABNORMAL HIGH (ref 11.4–15.2)

## 2019-08-24 MED ORDER — TORSEMIDE 20 MG PO TABS
100.0000 mg | ORAL_TABLET | Freq: Every day | ORAL | Status: DC
  Administered 2019-08-25 – 2019-08-26 (×2): 100 mg via ORAL
  Filled 2019-08-24 (×3): qty 5

## 2019-08-24 MED ORDER — CHLORHEXIDINE GLUCONATE CLOTH 2 % EX PADS
6.0000 | MEDICATED_PAD | Freq: Every day | CUTANEOUS | Status: DC
  Administered 2019-08-25: 6 via TOPICAL

## 2019-08-24 NOTE — Progress Notes (Addendum)
Progress Note  Patient Name: [name] Date of Encounter: 08/24/2019  Primary Cardiologist: Corey Skains, MD   Subjective   Patient complaining of pain in the right IJ cath site. Denies chest pain. Still with shortness of breath on exertion.  Inpatient Medications    Scheduled Meds: . aspirin EC  81 mg Oral Daily  . atorvastatin  80 mg Oral q1800  . calcitRIOL  0.5 mcg Oral Daily  . Chlorhexidine Gluconate Cloth  6 each Topical Q0600  . clopidogrel  75 mg Oral Daily  . darbepoetin (ARANESP) injection - DIALYSIS  100 mcg Intravenous Q Wed-HD  . feeding supplement (NEPRO CARB STEADY)  237 mL Oral TID BM  . fludrocortisone  0.1 mg Oral Daily  . insulin aspart  0-15 Units Subcutaneous TID WC  . insulin aspart  0-5 Units Subcutaneous QHS  . insulin glargine  15 Units Subcutaneous BID  . levothyroxine  300 mcg Oral Q0600  . midodrine  10 mg Oral TID WC  . multivitamin  1 tablet Oral QHS  . pantoprazole  40 mg Oral Daily  . sevelamer carbonate  800 mg Oral TID WC  . sodium chloride flush  10-40 mL Intracatheter Q12H  . warfarin  2.5 mg Oral ONCE-1800  . Warfarin - Pharmacist Dosing Inpatient   Does not apply q1800   Continuous Infusions: . ferric gluconate (FERRLECIT/NULECIT) IV 125 mg (08/23/19 1806)  . lactated ringers 10 mL/hr at 08/20/19 0928   PRN Meds: acetaminophen, albuterol, food thickener, guaiFENesin, heparin, loperamide, nitroGLYCERIN, ondansetron (ZOFRAN) IV, oxyCODONE-acetaminophen, Resource ThickenUp Clear, sodium chloride, sodium chloride flush   Vital Signs    Vitals:   08/23/19 2329 08/24/19 0035 08/24/19 0410 08/24/19 0411  BP: (!) 62/45 (!) 70/46 (!) 75/47   Pulse: 63  (!) 57   Resp: 18  20   Temp: 98 F (36.7 C)  (!) 97.5 F (36.4 C)   TempSrc: Oral  Oral   SpO2: 92%  95%   Weight:    90.7 kg  Height:        Intake/Output Summary (Last 24 hours) at 08/24/2019 0909 Last data filed at 08/24/2019 0440 Gross per 24 hour  Intake 514.21  ml  Output 759 ml  Net -244.79 ml   Last 3 Weights 08/24/2019 08/23/2019 08/23/2019  Weight (lbs) 199 lb 15.3 oz 198 lb 13.7 oz 200 lb 6.4 oz  Weight (kg) 90.7 kg 90.2 kg 90.9 kg      Telemetry    Aflutter, HR 60s - Personally Reviewed  ECG    No new - Personally Reviewed  Physical Exam   GEN: No acute distress.   Neck: No JVD; right IJ mild erythema and very TTP Cardiac: RRR, systolic murmurs, rubs, or gallops.  Respiratory: Clear to auscultation bilaterally. GI: Soft, nontender, non-distended  MS: No edema; No deformity. Neuro:  Nonfocal  Psych: Normal affect   Labs    High Sensitivity Troponin:   Recent Labs  Lab 08/04/19 2250 08/05/19 0043 08/05/19 0818 08/05/19 1624  TROPONINIHS 471* 507* 1,266* 1,123*      Chemistry Recent Labs  Lab 08/22/19 0318 08/23/19 0300 08/24/19 0236  NA 139 138 136  K 4.4 4.4 3.4*  CL 105 105 100  CO2 19* 18* 25  GLUCOSE 127* 107* 80  BUN 53* 58* 19  CREATININE 10.09* 10.34* 5.03*  CALCIUM 8.3* 8.5* 8.2*  ALBUMIN 2.1* 2.0* 2.4*  GFRNONAA 4* 4* 10*  GFRAA 5* 5* 12*  ANIONGAP 15  15 11     Hematology Recent Labs  Lab 08/22/19 0318 08/23/19 0300 08/24/19 0236  WBC 11.1* 10.5 7.8  RBC 2.54* 2.54* 2.25*  HGB 8.4* 8.4* 7.3*  HCT 27.6* 27.0* 23.4*  MCV 108.7* 106.3* 104.0*  MCH 33.1 33.1 32.4  MCHC 30.4 31.1 31.2  RDW 16.5* 16.8* 16.4*  PLT 193 166 129*    BNPNo results for input(s): BNP, PROBNP in the last 168 hours.   DDimer No results for input(s): DDIMER in the last 168 hours.   Radiology    No results found.  Cardiac Studies   Echocardiogram 08/07/2019: 1. Left ventricular ejection fraction, by visual estimation, is 60 to 65%. The left ventricle has normal function. Normal left ventricular size. There is no left ventricular hypertrophy. 2. Global right ventricle has normal systolic function.The right ventricular size is normal. No increase in right ventricular wall thickness. 3. Left atrial size was  normal. 4. Right atrial size was norma 5. Severe mitral annular calcification. Mild calcification of the anterior mitral valve leaflet(s).. No evidence of mitral valve regurgitation. Mild mitral stenosis. 6. The tricuspid valve is normal in structure. Tricuspid valve regurgitation is mild. 7. The aortic valve is tricuspid Aortic valve regurgitation is mild by color flow Doppler. Moderate aortic valve sclerosis/calcification without any evidence of aortic stenosis. 8. The pulmonic valve was normal in structure. Pulmonic valve regurgitation is not visualized by color flow Doppler. 9. Normal pulmonary artery systolic pressure. 10. The inferior vena cava is normal in size with greater than 50% respiratory variability, suggesting right atrial pressure of 3 mmHg. 11. No evidence of pericardial effusion.  Cardiac catheterization 08/06/2019:  Ost RCA to Prox RCA lesion is 100% stenosed.  Ost Cx lesion is 75% stenosed.  Ramus lesion is 75% stenosed.  Dist LM to Prox LAD lesion is 85% stenosed.  Mid Graft lesion is 85% stenosed.  Origin lesion is 100% stenosed.  78 year old male with hypertension hyperlipidemia and known coronary artery bypass surgery in the past with acute on chronic kidney disease needing dialysis treatment. The patient has acute on this set of chest discomfort intermittent over the last 48 hours with elevated troponin consistent with non-ST elevation myocardial infarction.  Left ventricle not injected due to kidney disease  100% stenosis of right coronary artery ostium Distal left main coronary atherosclerosis of 75 to 80% to trifurcation of left anterior descending artery, circumflex artery, and ramus artery Patent mammary graft to the left anterior descending artery Occluded saphenous vein graft to obtuse marginal 1 Patent but significant stenosis and thrombosed proximal graft to right coronary artery  Plan PCI stent placement of graft to right coronary  artery Dual antiplatelet therapy Heparin Continue high intensity cholesterol therapy No change in medication management for hypertension control  PCI 08/06/2019: Conclusions: 1. See diagnostic angiogram by Dr. Nehemiah Massed for details of coronary/graft anatomy. 2. Severe disease involving SVG-RCA with 80-90% stenosis. 3. Successful PCI to SVG-RCA using Resolute Onyx 4.0 x 38 mm drug-eluting stent with 10% residual stenosis and TIMI-3 flow. 4. Focal perforation of the SVG-RCA during stent deployment. This was successfully contained with internal tamponade.  Recommendations: 1. Dual antiplatelet therapy with aspirin and ticagrelor for at least 12 months. 2. Aggressive secondary prevention. 3. Transfer to [facility] for ICU monitoring, given focal perforation of SVG-RCA. If the patient were to become hemodynamically unstable or develop worsening anemia, repeat angiography of SVG-RCA and possible covered stent placement would need to be considered.   Patient Profile  78 y.o. male with a history of CAD s/p CABG, DM, HTN, HLD, CKD stage 4 who presented with an NSTEMI s/p cardiac catheterizaion complicated by perforated SVG to RCA graft who was transferred from [facility] also with CKD stage 5 requiring HD.  Assessment & Plan    Multivessel CAD s/p CABG with recent NSTEMI and DES intervention to the SVG to RCA (complicated by perforation) - continue ASA and Plavix - Not on BB due to intermittent hypotension - No recurrent CP or SOB - continue Lipitor - Patient is extremely deconditioned. Plan to discharge patient to CIR  Persistent Atrial flutter with variable conduction - HR have been controlled - CHADSVASC = 7 - Coumadin resumed per pharmacy  Orthostatic Hypotension - predominately worse at nighttime. 82/54 this AM in the right arm. Left with AV fistula - Mostly asymptomatic - Midodrine was increased to 10 mg TID - Florinef was started - Pressures this morning 75/47  right arm. Left arm showing systolics in the 768T.  CKD stage 5 with intermittent HD - underwent placement of AV fistula, has right IJ catheter as well with mild erythema and is very TTP - Nephrology following  For questions or updates, please contact [place] Please consult [email or web address] for contact info under     Signed, Cadence Ninfa Meeker, PA-C  08/24/2019, 9:09 AM    The patient was seen, examined and discussed with Cadence Ninfa Meeker, PA-C   and I agree with the above.   The patient remains orthostatic, he was started on fludrocortisone, I agree with that in addition to midodrine, he remains in atrial flutter that is rate controlled.  He is being evaluated by inpatient rehab, awaiting insurance approval.  Ena Dawley, MD 08/24/2019

## 2019-08-24 NOTE — Progress Notes (Signed)
SLP Note  Patient Details Name: [name] MRN: [id number] DOB: Jun 02, 1941   SLP made aware that pt's diet had been upgraded to regular solids and thin liquids. MBS 10/30 recommended Dys 1 diet and honey thick liquids by spoonful with a head turn. Pt was silently aspirating all liquids at that time - including honey thick liquids if not consumed by spoonfuls with a head turn. RN says that diet was upgraded because pt would not eat his modified diet. SLP attempted to schedule a repeat MBS to assess for potential upgrade but the earliest this can be done is tomorrow.   Per original MBS, pt did well with solids from an oral and pharyngeal standpoint, but he declined anything more than a pureed consistency. Based on those findings, it should be okay to advance his solids (although please note that this SLP has not seen him since that time). Strongly recommend keeping his liquids at honey thick, by spoon, with a head turn until MBS can be completed given the silent aspiration he exhibited this admission. RN with pt and notifying him. Will f/u for MBS as soon as possible.   [name] 08/24/2019, 12:47 PM  [name], M.A. [place] Acute Environmental education officer [phone] Office [phone]

## 2019-08-24 NOTE — Progress Notes (Signed)
Patient called out c/o his right leg jumping. Patient stated that it just started. Upon assessment, this RN noticed that the patient's right foot from middle to toes was cold to touch and pale in color. Left foot warm. Positive pulses in right foot were checked with doppler.   Ace wrap on right leg removed. Patient stated that his leg was feeling better and that it had stopped jumping. Told patient to call if it started back up. Will continue to monitor

## 2019-08-24 NOTE — Progress Notes (Signed)
Patient c/o pressure/urge at penis site. No kinks in tubing. Patient feels that he has to go but can not. Notified Dr. Justin Mend of the situation. Dr. Justin Mend suggested to take the foley out to hopefully give the bladder a rest. Bladder scan in AM. >200, foley may need to be replaced. Will call MD to get order for foley if needed. Will continue to monitor

## 2019-08-24 NOTE — Progress Notes (Addendum)
Please assess right IJ HD cath. It is red and painful to touch. Patient states that it is excruciating painful. Thank you

## 2019-08-24 NOTE — Plan of Care (Signed)
  Problem: Clinical Measurements: Goal: Will remain free from infection Outcome: Progressing   Problem: Coping: Goal: Level of anxiety will decrease Outcome: Progressing   

## 2019-08-24 NOTE — Progress Notes (Signed)
Nutrition Follow up  DOCUMENTATION CODES:   Not applicable  INTERVENTION:    Continue Nepro Shake po TID, each supplement provides 425 kcal and 19 grams protein thickened to honey thick  Continue Magic cup TID with meals, each supplement provides 290 kcal and 9 grams of protein  Continue renal MVI daily   NUTRITION DIAGNOSIS:   Increased nutrient needs related to post-op healing as evidenced by estimated needs.  Ongoing  GOAL:   Patient will meet greater than or equal to 90% of their needs   Progressing  MONITOR:   Supplement acceptance, Weight trends, PO intake, Labs, I & O's, Skin  REASON FOR ASSESSMENT:   LOS    ASSESSMENT:   Patient with PMH significant for CAD s/p CABG, DM, HTN, HLD, and CKD IV. Presents this admission with NSTEMI, underwent cardiac cath/stenting to SVG. Transferred to [facility] after subsequent perforation.   10/23- cardiac cath, stenting to SVG 10/26- tx to [facility], start CRRT 10/29- stop CRRT 10/30- first HD, diet downgraded to D1 honey thick liquids  11/6- R IJ, L AVF creation  RD working remotely.  Diet advanced to regular consistency with honey thick liquids. Meal completions charted as 15-100% for his last eight meals (ate 100% of lunch with diet change per RN). Nepro intake off/on. RN to provide pt with one this afternoon. Will continue to monitor intake. Plan d/c to CIR.   Admission weight: 99 kg  Current weight: 90.7 kg    I/O: -7,802 ml since admit UOP: 600 x 24 hrs  Last HD yesterday: 159 ml net UF   Medications: calcitriol, aranesp, SS novolog, lantus, rena-vit, renvela Labs: K 3.4 (L) CBG 71-139   Diet Order:   Diet Order            Diet Carb Modified Fluid consistency: Honey Thick; Room service appropriate? Yes with Assist  Diet effective now              EDUCATION NEEDS:   Not appropriate for education at this time  Skin:  Skin Assessment: Skin Integrity Issues: Skin Integrity Issues:: Incisions, Other  (Comment) Incisions: buttocks Other: petechiae- bilateral thigh  Last BM:  11/9  Height:   Ht Readings from Last 1 Encounters:  08/20/19 6\' 2"  (1.88 m)    Weight:   Wt Readings from Last 1 Encounters:  08/24/19 90.7 kg    Ideal Body Weight:  86.4 kg  BMI:  Body mass index is 25.67 kg/m.  Estimated Nutritional Needs:   Kcal:  2300-2500 kcal  Protein:  120-135 grams  Fluid:  1000 ml + UOP    Mariana Single RD, LDN Clinical Nutrition Pager # [phone]

## 2019-08-24 NOTE — Progress Notes (Signed)
Physical Therapy Treatment Patient Details Name: [name] MRN: [id number] DOB: 12-Nov-1940 Today's Date: 08/24/2019    History of Present Illness Pt is a 78 year old gentleman with a history of coronary artery status post CABG 1997, diabetes, hypertension, hyperlipidemia, chronic kidney disease stage IV at baseline who presents with non-ST segment elevated MI I s/p cardiac catheterization stent complicated by perforation.  Transferred to [facility] 08/09/2019.  Acute on chronic kidney injury sustained requiring CRRT 08/09/2019-08/12/2019.    PT Comments    Patient progressing slowly towards PT goals. Reports feeling pretty good today. Requires Mod A of 2 for bed mobility and standing transfers today due to marked weakness in core/trunk and BLEs. Able to take a few steps to get to chair with Mod A of 2 for safety due to bil knee instability and sitting prematurely. Continues to have low BP which drops with changes in position. Supine BP 74/47, sitting BP 74/48, post transfer BP 57/48 (54), mildly symptomatic with dizziness which pt reports is better than normal. Ace wrapped BLEs prior to session which seemed to help initially. Wife present towards end of session. RN present and reports no BP parameters at this time. Continue to recommend post acute rehab. Will follow.   Follow Up Recommendations  CIR;Supervision/Assistance - 24 hour     Equipment Recommendations  Rolling walker with 5" wheels;3in1 (PT)    Recommendations for Other Services       Precautions / Restrictions Precautions Precautions: Fall Precaution Comments: orthostatic Restrictions Weight Bearing Restrictions: No    Mobility  Bed Mobility Overal bed mobility: Needs Assistance Bed Mobility: Supine to Sit     Supine to sit: Mod assist;+2 for physical assistance;HOB elevated     General bed mobility comments: Cues for technique, assist with scooting bottom and with trunk to get to EOB. Poor sitting  balance with posterior bias/lean.  Transfers Overall transfer level: Needs assistance Equipment used: Rolling walker (2 wheeled) Transfers: Sit to/from Stand Sit to Stand: Mod assist;+2 physical assistance         General transfer comment: mod assist +2 to power up to standing with cues for upright.  Ambulation/Gait Ambulation/Gait assistance: Mod assist;+2 physical assistance;+2 safety/equipment Gait Distance (Feet): 5 Feet Assistive device: Rolling walker (2 wheeled) Gait Pattern/deviations: Step-to pattern;Trunk flexed;Shuffle Gait velocity: decreased   General Gait Details: Slow. unsteady gait with increased knee/hip flexion with each step, bil knee instability. Prematurely sitting down into chair.   Stairs             Wheelchair Mobility    Modified Rankin (Stroke Patients Only)       Balance Overall balance assessment: Needs assistance Sitting-balance support: Feet supported;Bilateral upper extremity supported Sitting balance-Leahy Scale: Poor Sitting balance - Comments: Not able to sit EOB without UE support vs external support, posterior bias. Postural control: Posterior lean Standing balance support: During functional activity;Bilateral upper extremity supported Standing balance-Leahy Scale: Poor Standing balance comment: relaint on BUE and external support                            Cognition Arousal/Alertness: Awake/alert Behavior During Therapy: WFL for tasks assessed/performed Overall Cognitive Status: Within Functional Limits for tasks assessed                                 General Comments: appears WFL, following commands and aware of deficits. MIld confusion  at times, repeating some questions/things.      Exercises      General Comments General comments (skin integrity, edema, etc.): Supine BP 74/47, sitting BP 74/48, post transfer BP 57/48 (54) with mild dizziness reported. Per RN, no paramters on BP at this  time. Wrapped BLEs prior to mobility.      Pertinent Vitals/Pain Pain Assessment: No/denies pain    Home Living                      Prior Function            PT Goals (current goals can now be found in the care plan section) Progress towards PT goals: Progressing toward goals(slowly)    Frequency    Min 3X/week      PT Plan Current plan remains appropriate    Co-evaluation              AM-PAC PT "6 Clicks" Mobility   Outcome Measure  Help needed turning from your back to your side while in a flat bed without using bedrails?: A Little Help needed moving from lying on your back to sitting on the side of a flat bed without using bedrails?: A Lot Help needed moving to and from a bed to a chair (including a wheelchair)?: A Lot Help needed standing up from a chair using your arms (e.g., wheelchair or bedside chair)?: A Lot Help needed to walk in hospital room?: A Lot Help needed climbing 3-5 steps with a railing? : Total 6 Click Score: 12    End of Session Equipment Utilized During Treatment: Gait belt Activity Tolerance: Treatment limited secondary to medical complications (Comment)(drop in BP) Patient left: with call bell/phone within reach;with family/visitor present;in chair;with nursing/sitter in room;with chair alarm set Nurse Communication: Mobility status PT Visit Diagnosis: Muscle weakness (generalized) (M62.81);Difficulty in walking, not elsewhere classified (R26.2)     Time: 1657-9038 PT Time Calculation (min) (ACUTE ONLY): 23 min  Charges:  $Therapeutic Activity: 23-37 mins                     Marisa Severin, PT, DPT Acute Rehabilitation Services Pager [phone] Office [phone]       [name] 08/24/2019, 12:21 PM

## 2019-08-24 NOTE — Progress Notes (Signed)
Patient returned from dialysis. Vital signs taken. Still showing low b/ps. Patient denies any SOB or lightheadedness.

## 2019-08-24 NOTE — Progress Notes (Signed)
ANTICOAGULATION CONSULT NOTE - Follow Up Consult  Pharmacy Consult for heparin/warfarin Indication: atrial fibrillation (CHADS2VASc = 7)  Allergies  Allergen Reactions  . Ace Inhibitors Other (See Comments)    Reaction:  Raises potassium   . Quinapril Rash and Other (See Comments)    hyperkalemia    Patient Measurements: Height: 6\' 2"  (188 cm) Weight: 199 lb 15.3 oz (90.7 kg) IBW/kg (Calculated) : 82.2 Heparin Dosing Weight: 90.7 kg  Vital Signs: Temp: 97.5 F (36.4 C) (11/10 0410) Temp Source: Oral (11/10 0410) BP: 75/47 (11/10 0410) Pulse Rate: 57 (11/10 0410)  Labs: Recent Labs    08/22/19 0318 08/23/19 0300 08/24/19 0236  HGB 8.4* 8.4* 7.3*  HCT 27.6* 27.0* 23.4*  PLT 193 166 129*  LABPROT 15.9* 23.5* 34.2*  INR 1.3* 2.1* 3.5*  HEPARINUNFRC 0.37 0.34 0.24*  CREATININE 10.09* 10.34* 5.03*    Estimated Creatinine Clearance: 13.8 mL/min (A) (by C-G formula based on SCr of 5.03 mg/dL (H)).   Assessment: 78 yo M started on heparin for NSTEMI, s/p PCI with DES to SVG-RCA on 10/23 and continued for Afib (CHADS2VASC = 7). No anticoagulation prior to admission. Warfarin was started 10/29, then held starting 11/2 for AV Fistula placement. While warfarin was being held for AV fistula placement and patient was started on IV heparin. AV fistula was placed on 11/6 and vascular recommended resuming warfarin.   INR trended up to 3.5 this morning, heparin stopped. Warfarin was ordered for last night (11/9) but never charted, unclear whether it was administered or not.  Goal of Therapy:  INR 2-3  Heparin level 0.3-0.7 units/ml Monitor platelets by anticoagulation protocol: Yes  Plan:  -Stop heparin -Hold warfarin tonight - pt appears very sensitive to warfarin, will likely need to not give 5mg  doses once resumed -Daily INR   Arrie Senate, PharmD, BCPS Clinical Pharmacist [phone] Please check AMION for all [place] numbers 08/24/2019

## 2019-08-24 NOTE — Progress Notes (Signed)
[facility] NEPHROLOGY PROGRESS NOTE  Assessment/ Plan: Pt is a 78 y.o. yo male  W/ pmh of CAD s/p CABG, DM, HTN, HLD, CKD IV, who presented w/ NSTEMI, now s/p cardiac cath complicated by perforated SVG to RCA graft  # NSTEMI/CAD: s/p cardiac cath with perforation.  Cardiology following. Currently on DAPT.   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  No obstruction on renal u/s. Required CRRT, which was stopped 10/29. On MWF HD schedule. Vascular surgery placed [facility] and AV fistula Friday. Tolerated dialysis yesterday with prolonged treatment and use of albumin, kidney function labs subsequently improved this am.  - continue to try HD as BP allows - midodrine 10mg  TID, consider increasing.  Florinef started.  - renal navigator making arrangements for outpatient HD - PT recommending CIR  #Hypotension: consistently hypotensive since tunneled cath placement.  Still Having dizziness upon standing and DOE but able to tolerate HD  - midodrine 10mg  TID, consider increasing to 12.5mg  - florinef started  # new onset a fib/flutter: off beta blocker.  - coumadin resumed - aspirin, plavix continued for recent DES intervention and NSTEMI.  - managed per cardiology  #anemia: iron sat 20%. IV iron and ESA during dialysis.  Monitor CBC. hgb stable.   # Secondary hyperparathyroidism: . PTH elevated at 535,, consistent with secondary hyperPTH.  - sevelamer 800 TID   Subjective:  Pt states he feels the same today.  States he did not have any issues with dialysis other than pain.  States he neck at the site of the cath is very painful.     Objective Physical Exam: General:NAD, comfortable. Lying in bed.  Heart:RRR, s1s2 nl. No JVD, no hepatojugular reflux.  Lungs:clear b/l, bibasilar crackles heard Extremities:No edema Dialysis Access: tunneled HD cath, right IJ. Exquisitely TTP, some mild erythema proximal to insertion site but no swelling. Does  not appear infected.  Vital signs in last 24 hours: Vitals:   08/23/19 2329 08/24/19 0035 08/24/19 0410 08/24/19 0411  BP: (!) 62/45 (!) 70/46 (!) 75/47   Pulse: 63  (!) 57   Resp: 18  20   Temp: 98 F (36.7 C)  (!) 97.5 F (36.4 C)   TempSrc: Oral  Oral   SpO2: 92%  95%   Weight:    90.7 kg  Height:       Weight change: 1.3 kg  Intake/Output Summary (Last 24 hours) at 08/24/2019 0927 Last data filed at 08/24/2019 0440 Gross per 24 hour  Intake 314.21 ml  Output 759 ml  Net -444.79 ml       Labs: Basic Metabolic Panel: Recent Labs  Lab 08/22/19 0318 08/23/19 0300 08/24/19 0236  NA 139 138 136  K 4.4 4.4 3.4*  CL 105 105 100  CO2 19* 18* 25  GLUCOSE 127* 107* 80  BUN 53* 58* 19  CREATININE 10.09* 10.34* 5.03*  CALCIUM 8.3* 8.5* 8.2*  PHOS 7.1* 7.1* 4.0   Liver Function Tests: Recent Labs  Lab 08/22/19 0318 08/23/19 0300 08/24/19 0236  ALBUMIN 2.1* 2.0* 2.4*   No results for input(s): LIPASE, AMYLASE in the last 168 hours. No results for input(s): AMMONIA in the last 168 hours. CBC: Recent Labs  Lab 08/20/19 0242 08/21/19 0227 08/22/19 0318 08/23/19 0300 08/24/19 0236  WBC 13.7* 12.9* 11.1* 10.5 7.8  HGB 9.1* 8.3* 8.4* 8.4* 7.3*  HCT 28.7* 27.2* 27.6* 27.0* 23.4*  MCV 103.6* 106.7* 108.7* 106.3* 104.0*  PLT 205 184  193 166 129*   Cardiac Enzymes: No results for input(s): CKTOTAL, CKMB, CKMBINDEX, TROPONINI in the last 168 hours. CBG: Recent Labs  Lab 08/22/19 2105 08/23/19 0620 08/23/19 1222 08/23/19 2154 08/24/19 0644  GLUCAP 109* 93 259* 96 71    Iron Studies: No results for input(s): IRON, TIBC, TRANSFERRIN, FERRITIN in the last 72 hours. Studies/Results: No results found.  Medications: Infusions: . ferric gluconate (FERRLECIT/NULECIT) IV 125 mg (08/23/19 1806)  . lactated ringers 10 mL/hr at 08/20/19 5621    Scheduled Medications: . aspirin EC  81 mg Oral Daily  . atorvastatin  80 mg Oral q1800  . calcitRIOL  0.5 mcg  Oral Daily  . Chlorhexidine Gluconate Cloth  6 each Topical Q0600  . clopidogrel  75 mg Oral Daily  . darbepoetin (ARANESP) injection - DIALYSIS  100 mcg Intravenous Q Wed-HD  . feeding supplement (NEPRO CARB STEADY)  237 mL Oral TID BM  . fludrocortisone  0.1 mg Oral Daily  . insulin aspart  0-15 Units Subcutaneous TID WC  . insulin aspart  0-5 Units Subcutaneous QHS  . insulin glargine  15 Units Subcutaneous BID  . levothyroxine  300 mcg Oral Q0600  . midodrine  10 mg Oral TID WC  . multivitamin  1 tablet Oral QHS  . pantoprazole  40 mg Oral Daily  . sevelamer carbonate  800 mg Oral TID WC  . sodium chloride flush  10-40 mL Intracatheter Q12H  . warfarin  2.5 mg Oral ONCE-1800  . Warfarin - Pharmacist Dosing Inpatient   Does not apply q1800    have reviewed scheduled and prn medications.   Benay Pike 08/24/2019,9:27 AM  LOS: 18 days  Pager: [phone]

## 2019-08-25 ENCOUNTER — Inpatient Hospital Stay (HOSPITAL_COMMUNITY): Payer: Medicare HMO

## 2019-08-25 LAB — TYPE AND SCREEN
ABO/RH(D): O POS
Antibody Screen: NEGATIVE
Unit division: 0

## 2019-08-25 LAB — GLUCOSE, CAPILLARY
Glucose-Capillary: 154 mg/dL — ABNORMAL HIGH (ref 70–99)
Glucose-Capillary: 107 mg/dL — ABNORMAL HIGH (ref 70–99)
Glucose-Capillary: 164 mg/dL — ABNORMAL HIGH (ref 70–99)
Glucose-Capillary: 131 mg/dL — ABNORMAL HIGH (ref 70–99)

## 2019-08-25 LAB — RENAL FUNCTION PANEL
Albumin: 2.3 g/dL — ABNORMAL LOW (ref 3.5–5.0)
Anion gap: 11 (ref 5–15)
BUN: 27 mg/dL — ABNORMAL HIGH (ref 8–23)
CO2: 25 mmol/L (ref 22–32)
Calcium: 8.4 mg/dL — ABNORMAL LOW (ref 8.9–10.3)
Chloride: 102 mmol/L (ref 98–111)
Creatinine, Ser: 6.99 mg/dL — ABNORMAL HIGH (ref 0.61–1.24)
GFR calc Af Amer: 8 mL/min — ABNORMAL LOW (ref >60)
GFR calc non Af Amer: 7 mL/min — ABNORMAL LOW (ref >60)
Glucose, Bld: 169 mg/dL — ABNORMAL HIGH (ref 70–99)
Phosphorus: 5.7 mg/dL — ABNORMAL HIGH (ref 2.5–4.6)
Potassium: 3.7 mmol/L (ref 3.5–5.1)
Sodium: 138 mmol/L (ref 135–145)

## 2019-08-25 LAB — PROTIME-INR
INR: 2.6 — ABNORMAL HIGH (ref 0.8–1.2)
Prothrombin Time: 27.7 seconds — ABNORMAL HIGH (ref 11.4–15.2)

## 2019-08-25 LAB — BPAM RBC
Blood Product Expiration Date: 202012122359
Unit Type and Rh: 5100

## 2019-08-25 LAB — CBC
HCT: 25.5 % — ABNORMAL LOW (ref 39.0–52.0)
Hemoglobin: 8 g/dL — ABNORMAL LOW (ref 13.0–17.0)
MCH: 33.5 pg (ref 26.0–34.0)
MCHC: 31.4 g/dL (ref 30.0–36.0)
MCV: 106.7 fL — ABNORMAL HIGH (ref 80.0–100.0)
Platelets: 136 10*3/uL — ABNORMAL LOW (ref 150–400)
RBC: 2.39 MIL/uL — ABNORMAL LOW (ref 4.22–5.81)
RDW: 16.5 % — ABNORMAL HIGH (ref 11.5–15.5)
WBC: 10.1 10*3/uL (ref 4.0–10.5)
nRBC: 0.2 % (ref 0.0–0.2)

## 2019-08-25 LAB — PREPARE RBC (CROSSMATCH)

## 2019-08-25 LAB — ABO/RH: ABO/RH(D): O POS

## 2019-08-25 LAB — MAGNESIUM: Magnesium: 2.1 mg/dL (ref 1.7–2.4)

## 2019-08-25 MED ORDER — ALBUMIN HUMAN 25 % IV SOLN
25.0000 g | Freq: Once | INTRAVENOUS | Status: AC
  Administered 2019-08-25 (×2): 25 g via INTRAVENOUS

## 2019-08-25 MED ORDER — ALBUMIN HUMAN 25 % IV SOLN
INTRAVENOUS | Status: AC
  Administered 2019-08-25: 25 g via INTRAVENOUS
  Filled 2019-08-25: qty 100

## 2019-08-25 MED ORDER — MIDODRINE HCL 5 MG PO TABS
10.0000 mg | ORAL_TABLET | Freq: Once | ORAL | Status: AC
  Administered 2019-08-25: 10:00:00 10 mg via ORAL

## 2019-08-25 MED ORDER — MIDODRINE HCL 5 MG PO TABS
ORAL_TABLET | ORAL | Status: AC
  Administered 2019-08-25: 10 mg via ORAL
  Filled 2019-08-25: qty 2

## 2019-08-25 MED ORDER — HEPARIN SODIUM (PORCINE) 1000 UNIT/ML IJ SOLN
INTRAMUSCULAR | Status: AC
  Administered 2019-08-25: 1000 [IU] via INTRAVENOUS_CENTRAL
  Filled 2019-08-25: qty 4

## 2019-08-25 MED ORDER — CALCITRIOL 0.5 MCG PO CAPS
ORAL_CAPSULE | ORAL | Status: AC
  Administered 2019-08-25: 0.5 ug via ORAL
  Filled 2019-08-25: qty 1

## 2019-08-25 MED ORDER — SODIUM CHLORIDE 0.9% IV SOLUTION: Freq: Once | INTRAVENOUS | Status: DC

## 2019-08-25 MED ORDER — DARBEPOETIN ALFA 100 MCG/0.5ML IJ SOSY
PREFILLED_SYRINGE | INTRAMUSCULAR | Status: AC
  Administered 2019-08-25: 100 ug via INTRAVENOUS
  Filled 2019-08-25: qty 0.5

## 2019-08-25 MED ORDER — ACETAMINOPHEN 325 MG PO TABS
ORAL_TABLET | ORAL | Status: AC
  Administered 2019-08-25: 650 mg via ORAL
  Filled 2019-08-25: qty 2

## 2019-08-25 MED ORDER — WARFARIN SODIUM 1 MG PO TABS
1.0000 mg | ORAL_TABLET | Freq: Once | ORAL | Status: AC
  Administered 2019-08-25: 1 mg via ORAL
  Filled 2019-08-25 (×2): qty 1

## 2019-08-25 NOTE — Progress Notes (Signed)
Pt. C/o pain at HD catheter insertion site Dr. Joelyn Oms aware orders to change HD dressing and notify MD if s/s of infection. Dressing change mild redness no swelling or drainage noted. Pt afebrile

## 2019-08-25 NOTE — TOC Progression Note (Signed)
Transition of Care [facility]) - Progression Note    Patient Details  Name: [name] MRN: [id number] Date of Birth: [birth year]  Transition of Care [facility]) CM/SW [place], [place] Phone Number: 08/25/2019, 4:38 PM  Clinical Narrative:   CSW met with patient and wife with rehab coordinator to discuss with them that insurance denied CIR Admission. Patient and wife indicated understanding, and CSW discussed with them SNF placement as another rehab venue. Patient and wife not happy about it, but in agreement that the patient needs the rehab. Patient and wife asked CSW to call the patient's daughter, [name].  CSW called and spoke to [name] on the phone to discuss CIR denial and SNF recommendation. [name] also not happy about it, but in agreement. CSW discussed with [name] some of the options that are close to their house, and [name] requested the SNF in [place]. CSW to complete referral and ask about bed availability at [facility].   CSW to follow.    Expected Discharge Plan: Skilled Nursing Facility Barriers to Discharge: Continued Medical Work up, Ship broker  Expected Discharge Plan and Services Expected Discharge Plan: [place]   Discharge Planning Services: CM Consult Post Acute Care Choice: IP Rehab Living arrangements for the past 2 months: Single Family Home                                       Social Determinants of Health (SDOH) Interventions    Readmission Risk Interventions Readmission Risk Prevention Plan 08/12/2019  Transportation Screening Complete  PCP or Specialist Appt within 3-5 Days Not Complete  Not Complete comments Continued medical workup; not stable for discharge  [place] or [place] Complete  Social Work Consult for [place] Planning/Counseling Complete  Palliative Care Screening Not Applicable  Medication Review Press photographer) Complete  Some recent data might be hidden

## 2019-08-25 NOTE — Progress Notes (Signed)
ANTICOAGULATION CONSULT NOTE - Follow Up Consult  Pharmacy Consult for heparin/warfarin Indication: atrial fibrillation (CHADS2VASc = 7)  Allergies  Allergen Reactions  . Ace Inhibitors Other (See Comments)    Reaction:  Raises potassium   . Quinapril Rash and Other (See Comments)    hyperkalemia    Patient Measurements: Height: 6\' 2"  (188 cm) Weight: 200 lb 6.4 oz (90.9 kg) IBW/kg (Calculated) : 82.2 Heparin Dosing Weight: 90.7 kg  Vital Signs: Temp: 98.4 F (36.9 C) (11/11 0413) Temp Source: Oral (11/11 0413) BP: 94/58 (11/11 0413) Pulse Rate: 63 (11/11 0413)  Labs: Recent Labs    08/23/19 0300 08/24/19 0236 08/25/19 0236  HGB 8.4* 7.3*  --   HCT 27.0* 23.4*  --   PLT 166 129*  --   LABPROT 23.5* 34.2* 27.7*  INR 2.1* 3.5* 2.6*  HEPARINUNFRC 0.34 0.24*  --   CREATININE 10.34* 5.03* 6.99*    Estimated Creatinine Clearance: 10 mL/min (A) (by C-G formula based on SCr of 6.99 mg/dL (H)).   Assessment: 78 yo M started on heparin for NSTEMI, s/p PCI with DES to SVG-RCA on 10/23 and continued for Afib (CHADS2VASC = 7). No anticoagulation prior to admission. Warfarin was started 10/29, then held starting 11/2 for AV Fistula placement. While warfarin was being held for AV fistula placement and patient was started on IV heparin. AV fistula was placed on 11/6 and vascular recommended resuming warfarin.   INR down to 2.6 this morning. Warfarin was ordered for 11/9 but never charted, unclear whether it was administered or not. Warfarin held on 11/10. Patient appears to be sensitive to warfarin after one 5mg  dose. Will give smaller dose tonight.   Goal of Therapy:  INR 2-3  Heparin level 0.3-0.7 units/ml Monitor platelets by anticoagulation protocol: Yes  Plan:  - Warfarin 1mg  PO x 1 tonight -Daily INR   Ellarie Picking A. Levada Dy, PharmD, BCPS, FNKF Clinical Pharmacist [place] Please utilize Amion for appropriate phone number to reach the unit pharmacist ([place])   08/25/2019

## 2019-08-25 NOTE — Progress Notes (Signed)
Modified Barium Swallow Progress Note  Patient Details  Name: [name] MRN: [id number] Date of Birth: 27-Jan-1941  Today's Date: 08/25/2019  Modified Barium Swallow completed.  Full report located under Chart Review in the Imaging Section.  Brief recommendations include the following:  Clinical Impression  Pt's swallow function is grossly unchanged from initial MBS, except that he has started to cough spontaneously in response to larger amounts of aspiration. His cough is weak and not productive though, and smaller amounts of aspiration enter the airway silently. Penetration was trace and cleared the airway spontaneously with solids and honey thick liquids by spoon when he used a head turn. If he straightened his head, took larger boluses, or thinned his liquids at all, aspiration occurred. 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 For now, recommend Dys 3 diet and honey thick liquids by spoon with strict enforcement of strategies, including head turn to the RIGHT.    Swallow Evaluation Recommendations   Recommended Consults: Consider ENT evaluation   SLP Diet Recommendations: Dysphagia 3 (Mech soft) solids;Honey thick liquids   Liquid Administration via: Spoon   Medication Administration: Crushed with puree   Supervision: Patient able to self feed;Full supervision/cueing for compensatory strategies   Compensations: Slow rate;Small sips/bites;Other (Comment)(head turn to the RIGHT)   Postural Changes: Seated upright at 90 degrees;Remain semi-upright after after feeds/meals (Comment)   Oral Care Recommendations: Oral care BID   Other Recommendations: Order thickener from pharmacy;Prohibited food (jello, ice cream, thin soups);Remove water pitcher    Venita Sheffield Mehkai Gallo 08/25/2019,2:36 PM   Pollyann Glen, M.A. [place] Acute Environmental education officer [phone] Office [phone]

## 2019-08-25 NOTE — Progress Notes (Signed)
OT Cancellation Note  Patient Details Name: [name] MRN: [id number] DOB: 1940/12/17   Cancelled Treatment:    Reason Eval/Treat Not Completed: Other (comment). Spoke to [employer]. Pt is having swallow study and worn out from HD. Will check another day.  [name] 08/25/2019, 2:01 PM  Lesle Chris, OTR/L Acute Rehabilitation Services [phone] [facility] pager [phone] office 08/25/2019

## 2019-08-25 NOTE — Progress Notes (Signed)
OT Cancellation Note  Patient Details Name: [name] MRN: [id number] DOB: [birth year]   Cancelled Treatment:    Reason Eval/Treat Not Completed: Patient at procedure or test/ unavailable. Pt is in HD  [facility] 08/25/2019, 10:26 AM  Lesle Chris, OTR/L Acute Rehabilitation Services [phone] [facility] pager [phone] office 08/25/2019

## 2019-08-25 NOTE — Progress Notes (Signed)
[facility] NEPHROLOGY PROGRESS NOTE  Assessment/ Plan: Pt is a 78 y.o. yo male  W/ pmh of CAD s/p CABG, DM, HTN, HLD, CKD IV, who presented w/ NSTEMI, now s/p cardiac cath complicated by perforated SVG to RCA graft.   # NSTEMI/CAD: s/p cardiac cath with perforation.  Cardiology following. Currently on DAPT.   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  No obstruction on renal u/s. Required CRRT, which was stopped 10/29. On MWF HD schedule. Vascular surgery placed TDC and AV fistula 11/6. Scheduled for dialysis today. Will augment for hypotension.  - continue to try HD as BP allows - midodrine 10mg  TID, consider increasing.  Florinef started,  - renal navigator making arrangements for outpatient HD - PT recommending CIR - continues to experience pain at the cath site. Mildly erythematous, and exquisitely tender. Pt continues to be afebrile with no leukocytosis.  Continue to monitor for infection.  - some urine ouptut, torsemide added to help increase UOP.   #Hypotension: consistently hypotensive since tunneled cath placement.  Still Having dizziness upon standing and DOE but able to tolerate HD  - midodrine 10mg  TID, consider increasing to 12.5mg  if necessary - florinef BID  # new onset a fib/flutter: off beta blocker.  - coumadin resumed - aspirin, plavix continued for recent DES intervention and NSTEMI.  - managed per cardiology  #anemia: iron sat 20%. IV iron and ESA during dialysis.  Monitor CBC. Hgb downtrending yesterday. No cbc today.   # Secondary hyperparathyroidism: . PTH elevated at 535,, consistent with secondary hyperPTH.  - sevelamer 800 TID   Subjective:  Patient states he feles worse since starting dialysis today.  He has a headache and SOB.    Objective Physical Exam: General:NAD, receiving dialysis.  Heart:RRR, s1s2 nl.  Lungs:clear b/l, mild tachypnea and increased WOB.  Extremities:No edema Dialysis  Access: tunneled HD cath, right IJ. Site of HD cath insertion unchanged from yesterday, mild erythema.   Vital signs in last 24 hours: Vitals:   08/24/19 2321 08/25/19 0200 08/25/19 0413 08/25/19 0500  BP: (!) 79/52  (!) 94/58   Pulse: 66 63 63   Resp: 18 20 20    Temp: 98.5 F (36.9 C)  98.4 F (36.9 C)   TempSrc: Oral  Oral   SpO2: 93% 93% 93%   Weight:    90.9 kg  Height:       Weight change: 0 kg  Intake/Output Summary (Last 24 hours) at 08/25/2019 0721 Last data filed at 08/24/2019 2352 Gross per 24 hour  Intake 70 ml  Output 257 ml  Net -187 ml     Labs: Basic Metabolic Panel: Recent Labs  Lab 08/23/19 0300 08/24/19 0236 08/25/19 0236  NA 138 136 138  K 4.4 3.4* 3.7  CL 105 100 102  CO2 18* 25 25  GLUCOSE 107* 80 169*  BUN 58* 19 27*  CREATININE 10.34* 5.03* 6.99*  CALCIUM 8.5* 8.2* 8.4*  PHOS 7.1* 4.0 5.7*   Liver Function Tests: Recent Labs  Lab 08/23/19 0300 08/24/19 0236 08/25/19 0236  ALBUMIN 2.0* 2.4* 2.3*   No results for input(s): LIPASE, AMYLASE in the last 168 hours. No results for input(s): AMMONIA in the last 168 hours. CBC: Recent Labs  Lab 08/20/19 0242 08/21/19 0227 08/22/19 0318 08/23/19 0300 08/24/19 0236  WBC 13.7* 12.9* 11.1* 10.5 7.8  HGB 9.1* 8.3* 8.4* 8.4* 7.3*  HCT 28.7* 27.2* 27.6* 27.0* 23.4*  MCV 103.6* 106.7* 108.7*  106.3* 104.0*  PLT 205 184 193 166 129*   Cardiac Enzymes: No results for input(s): CKTOTAL, CKMB, CKMBINDEX, TROPONINI in the last 168 hours. CBG: Recent Labs  Lab 08/24/19 0644 08/24/19 1110 08/24/19 1609 08/24/19 2145 08/25/19 0643  GLUCAP 71 111* 141* 195* 154*    Iron Studies: No results for input(s): IRON, TIBC, TRANSFERRIN, FERRITIN in the last 72 hours. Studies/Results: No results found.  Medications: Infusions: . ferric gluconate (FERRLECIT/NULECIT) IV 125 mg (08/23/19 1806)  . lactated ringers 10 mL/hr at 08/20/19 3716    Scheduled Medications: . aspirin EC  81 mg Oral  Daily  . atorvastatin  80 mg Oral q1800  . calcitRIOL  0.5 mcg Oral Daily  . Chlorhexidine Gluconate Cloth  6 each Topical Q0600  . clopidogrel  75 mg Oral Daily  . darbepoetin (ARANESP) injection - DIALYSIS  100 mcg Intravenous Q Wed-HD  . feeding supplement (NEPRO CARB STEADY)  237 mL Oral TID BM  . fludrocortisone  0.1 mg Oral Daily  . insulin aspart  0-15 Units Subcutaneous TID WC  . insulin aspart  0-5 Units Subcutaneous QHS  . insulin glargine  15 Units Subcutaneous BID  . levothyroxine  300 mcg Oral Q0600  . midodrine  10 mg Oral TID WC  . multivitamin  1 tablet Oral QHS  . pantoprazole  40 mg Oral Daily  . sevelamer carbonate  800 mg Oral TID WC  . sodium chloride flush  10-40 mL Intracatheter Q12H  . torsemide  100 mg Oral Daily  . Warfarin - Pharmacist Dosing Inpatient   Does not apply q1800    have reviewed scheduled and prn medications.   Benay Pike 08/25/2019,7:21 AM  LOS: 19 days  Pager: [phone]

## 2019-08-25 NOTE — Progress Notes (Signed)
Unable to UF pt. Due to low bp as documented pt. Given albumi 25g x2. Pt asymptomatic. Dr. Joelyn Oms made aware at bedside. Orders for midodrine as documented. May uf keep MAP >60. Orders to transfuse 1unit prbc for hemoglobin <8. Awaiting cbc results. Pt. stable

## 2019-08-25 NOTE — Plan of Care (Signed)
  Problem: Elimination: Goal: Will not experience complications related to urinary retention Outcome: Not Progressing

## 2019-08-25 NOTE — Procedures (Signed)
I was present at this dialysis session. I have reviewed the session itself and made appropriate changes.   Sig IDH during HD.  Rec midodrine pre HD and albumin 25gm x2,  Will give another midodrine, UF if MAP > 60.    Filed Weights   08/24/19 0411 08/25/19 0500 08/25/19 0745  Weight: 90.7 kg 90.9 kg 91.3 kg    Recent Labs  Lab 08/25/19 0236  NA 138  K 3.7  CL 102  CO2 25  GLUCOSE 169*  BUN 27*  CREATININE 6.99*  CALCIUM 8.4*  PHOS 5.7*    Recent Labs  Lab 08/22/19 0318 08/23/19 0300 08/24/19 0236  WBC 11.1* 10.5 7.8  HGB 8.4* 8.4* 7.3*  HCT 27.6* 27.0* 23.4*  MCV 108.7* 106.3* 104.0*  PLT 193 166 129*    Scheduled Meds: . aspirin EC  81 mg Oral Daily  . atorvastatin  80 mg Oral q1800  . calcitRIOL  0.5 mcg Oral Daily  . Chlorhexidine Gluconate Cloth  6 each Topical Q0600  . clopidogrel  75 mg Oral Daily  . darbepoetin (ARANESP) injection - DIALYSIS  100 mcg Intravenous Q Wed-HD  . feeding supplement (NEPRO CARB STEADY)  237 mL Oral TID BM  . fludrocortisone  0.1 mg Oral Daily  . insulin aspart  0-15 Units Subcutaneous TID WC  . insulin aspart  0-5 Units Subcutaneous QHS  . insulin glargine  15 Units Subcutaneous BID  . levothyroxine  300 mcg Oral Q0600  . midodrine  10 mg Oral TID WC  . multivitamin  1 tablet Oral QHS  . pantoprazole  40 mg Oral Daily  . sevelamer carbonate  800 mg Oral TID WC  . sodium chloride flush  10-40 mL Intracatheter Q12H  . torsemide  100 mg Oral Daily  . warfarin  1 mg Oral ONCE-1800  . Warfarin - Pharmacist Dosing Inpatient   Does not apply q1800   Continuous Infusions: . ferric gluconate (FERRLECIT/NULECIT) IV 125 mg (08/23/19 1806)  . lactated ringers 10 mL/hr at 08/20/19 0928   PRN Meds:.acetaminophen, albuterol, food thickener, guaiFENesin, heparin, loperamide, nitroGLYCERIN, ondansetron (ZOFRAN) IV, oxyCODONE-acetaminophen, Resource ThickenUp Clear, sodium chloride, sodium chloride flush   Pearson Grippe  MD 08/25/2019,  9:35 AM

## 2019-08-25 NOTE — Progress Notes (Signed)
Inpatient Rehabilitation-Admissions Coordinator   [employer] company has issued a denial for this patient's CIR request. Pt will need an alternative venue for his post acute rehab. I will discuss this with him once he is back from HD. I have notified CM regarding his denial and need for new dispo plan.   AC will sign off.   Please call if questions.   Jhonnie Garner, OTR/L  Rehab Admissions Coordinator  [phone] 08/25/2019 11:25 AM

## 2019-08-25 NOTE — Progress Notes (Signed)
Hemoglobin 8. Dr. Joelyn Oms aware hold prbc transfusion at this time

## 2019-08-25 NOTE — Progress Notes (Addendum)
Progress Note  Patient Name: [name] Date of Encounter: 08/25/2019  Primary Cardiologist: Corey Skains, MD   Subjective   Patient seen in HD. No CP or SOB.   Inpatient Medications    Scheduled Meds: . sodium chloride   Intravenous Once  . aspirin EC  81 mg Oral Daily  . atorvastatin  80 mg Oral q1800  . calcitRIOL      . calcitRIOL  0.5 mcg Oral Daily  . Chlorhexidine Gluconate Cloth  6 each Topical Q0600  . clopidogrel  75 mg Oral Daily  . darbepoetin (ARANESP) injection - DIALYSIS  100 mcg Intravenous Q Wed-HD  . feeding supplement (NEPRO CARB STEADY)  237 mL Oral TID BM  . fludrocortisone  0.1 mg Oral Daily  . heparin      . insulin aspart  0-15 Units Subcutaneous TID WC  . insulin aspart  0-5 Units Subcutaneous QHS  . insulin glargine  15 Units Subcutaneous BID  . levothyroxine  300 mcg Oral Q0600  . midodrine  10 mg Oral TID WC  . multivitamin  1 tablet Oral QHS  . pantoprazole  40 mg Oral Daily  . sevelamer carbonate  800 mg Oral TID WC  . sodium chloride flush  10-40 mL Intracatheter Q12H  . torsemide  100 mg Oral Daily  . warfarin  1 mg Oral ONCE-1800  . Warfarin - Pharmacist Dosing Inpatient   Does not apply q1800   Continuous Infusions: . ferric gluconate (FERRLECIT/NULECIT) IV 125 mg (08/25/19 1049)  . lactated ringers 10 mL/hr at 08/20/19 0928   PRN Meds: acetaminophen, albuterol, food thickener, guaiFENesin, heparin, loperamide, nitroGLYCERIN, ondansetron (ZOFRAN) IV, oxyCODONE-acetaminophen, Resource ThickenUp Clear, sodium chloride, sodium chloride flush   Vital Signs    Vitals:   08/25/19 0930 08/25/19 1000 08/25/19 1030 08/25/19 1100  BP: (!) 88/64 (!) 87/63 (!) 82/59 (!) 86/56  Pulse: (!) 51 85 79 90  Resp:      Temp:      TempSrc:      SpO2:      Weight:      Height:        Intake/Output Summary (Last 24 hours) at 08/25/2019 1145 Last data filed at 08/24/2019 2352 Gross per 24 hour  Intake 0 ml  Output 257 ml  Net -257  ml   Last 3 Weights 08/25/2019 08/25/2019 08/24/2019  Weight (lbs) 201 lb 4.5 oz 200 lb 6.4 oz 199 lb 15.3 oz  Weight (kg) 91.3 kg 90.9 kg 90.7 kg      Telemetry    Adib, HR 80s, frequent PVCs, trigeminy - Personally Reviewed  ECG    No new - Personally Reviewed  Physical Exam   GEN: No acute distress.   Neck: No JVD Cardiac: Irreg Irreg, no murmurs, rubs, or gallops.  Respiratory: Clear to auscultation bilaterally.; 3-4L O2 GI: Soft, nontender, non-distended  MS: No edema; No deformity. Neuro:  Nonfocal  Psych: Normal affect   Labs    High Sensitivity Troponin:   Recent Labs  Lab 08/04/19 2250 08/05/19 0043 08/05/19 0818 08/05/19 1624  TROPONINIHS 471* 507* 1,266* 1,123*      Chemistry Recent Labs  Lab 08/23/19 0300 08/24/19 0236 08/25/19 0236  NA 138 136 138  K 4.4 3.4* 3.7  CL 105 100 102  CO2 18* 25 25  GLUCOSE 107* 80 169*  BUN 58* 19 27*  CREATININE 10.34* 5.03* 6.99*  CALCIUM 8.5* 8.2* 8.4*  ALBUMIN 2.0* 2.4* 2.3*  GFRNONAA  4* 10* 7*  GFRAA 5* 12* 8*  ANIONGAP 15 11 11      Hematology Recent Labs  Lab 08/23/19 0300 08/24/19 0236 08/25/19 0814  WBC 10.5 7.8 10.1  RBC 2.54* 2.25* 2.39*  HGB 8.4* 7.3* 8.0*  HCT 27.0* 23.4* 25.5*  MCV 106.3* 104.0* 106.7*  MCH 33.1 32.4 33.5  MCHC 31.1 31.2 31.4  RDW 16.8* 16.4* 16.5*  PLT 166 129* 136*    BNPNo results for input(s): BNP, PROBNP in the last 168 hours.   DDimer No results for input(s): DDIMER in the last 168 hours.   Radiology    No results found.  Cardiac Studies   Echocardiogram 08/07/2019: 1. Left ventricular ejection fraction, by visual estimation, is 60 to 65%. The left ventricle has normal function. Normal left ventricular size. There is no left ventricular hypertrophy. 2. Global right ventricle has normal systolic function.The right ventricular size is normal. No increase in right ventricular wall thickness. 3. Left atrial size was normal. 4. Right atrial size was  norma 5. Severe mitral annular calcification. Mild calcification of the anterior mitral valve leaflet(s).. No evidence of mitral valve regurgitation. Mild mitral stenosis. 6. The tricuspid valve is normal in structure. Tricuspid valve regurgitation is mild. 7. The aortic valve is tricuspid Aortic valve regurgitation is mild by color flow Doppler. Moderate aortic valve sclerosis/calcification without any evidence of aortic stenosis. 8. The pulmonic valve was normal in structure. Pulmonic valve regurgitation is not visualized by color flow Doppler. 9. Normal pulmonary artery systolic pressure. 10. The inferior vena cava is normal in size with greater than 50% respiratory variability, suggesting right atrial pressure of 3 mmHg. 11. No evidence of pericardial effusion.  Cardiac catheterization 08/06/2019:  Ost RCA to Prox RCA lesion is 100% stenosed.  Ost Cx lesion is 75% stenosed.  Ramus lesion is 75% stenosed.  Dist LM to Prox LAD lesion is 85% stenosed.  Mid Graft lesion is 85% stenosed.  Origin lesion is 100% stenosed.  78 year old male with hypertension hyperlipidemia and known coronary artery bypass surgery in the past with acute on chronic kidney disease needing dialysis treatment. The patient has acute on this set of chest discomfort intermittent over the last 48 hours with elevated troponin consistent with non-ST elevation myocardial infarction.  Left ventricle not injected due to kidney disease  100% stenosis of right coronary artery ostium Distal left main coronary atherosclerosis of 75 to 80% to trifurcation of left anterior descending artery, circumflex artery, and ramus artery Patent mammary graft to the left anterior descending artery Occluded saphenous vein graft to obtuse marginal 1 Patent but significant stenosis and thrombosed proximal graft to right coronary artery  Plan PCI stent placement of graft to right coronary artery Dual antiplatelet therapy  Heparin Continue high intensity cholesterol therapy No change in medication management for hypertension control  PCI 08/06/2019: Conclusions: 1. See diagnostic angiogram by Dr. Nehemiah Massed for details of coronary/graft anatomy. 2. Severe disease involving SVG-RCA with 80-90% stenosis. 3. Successful PCI to SVG-RCA using Resolute Onyx 4.0 x 38 mm drug-eluting stent with 10% residual stenosis and TIMI-3 flow. 4. Focal perforation of the SVG-RCA during stent deployment. This was successfully contained with internal tamponade.  Recommendations: 1. Dual antiplatelet therapy with aspirin and ticagrelor for at least 12 months. 2. Aggressive secondary prevention. 3. Transfer to [facility] for ICU monitoring, given focal perforation of SVG-RCA. If the patient were to become hemodynamically unstable or develop worsening anemia, repeat angiography of SVG-RCA and possible covered stent placement would  need to be considered.   Patient Profile     78 y.o. male with a history of CAD s/p CABG, DM, HTN, HLD, CKD stage 4 who presented with an NSTEMI s/p cardiac catheterization complicated by perforated SVG to RCA graft who was transferred from [facility] also with CKD stage 5 requiring HD.  Assessment & Plan    Multivessel CAD s/p CABG with recent NSTEMI and DES intervention to the SVG to RCA (complicated by perforation) - continue ASA and Plavix - Not on BB due to intermittent hypotension - No recurrent CP or SOB - continue Lipitor - Patient is extremely deconditioned. Plan was to discharge patient to CIR but insurance did not approve this. Will consult case management for SNF placement.  Persistent Atrial flutter with variable conduction - HR have been controlled - CHADSVASC = 7 - Coumadin resumed per pharmacy  Orthostatic Hypotension - predominately worse at nighttime. 86/56 this AM in the right arm. Left with AV fistula - Mostly asymptomatic - Midodrine was increased to 10 mg TID  - Florinef was started >> will likely need titrated dose - Monitor pressures during HD  CKD stage 5 with intermittent HD - underwent placement of AV fistula, has right IJ catheter as well with mild erythema and is very TTP - Nephrology following - HD today  For questions or updates, please contact [place] Please consult [email or web address] for contact info under     Signed, Cadence Ninfa Meeker, PA-C  08/25/2019, 11:45 AM    The patient was seen, examined and discussed with Cadence Ninfa Meeker, PA-C   and I agree with the above.   The patient remains orthostatic, he was started on fludrocortisone, I will increase to 0.2 mg po daily, I agree with that in addition to midodrine, he remains in atrial flutter that is rate controlled.  He was denied by inpatient rehab as insurance didn't approve, we consulted case management for placement to another nursing facility as he is significantly deconditioned.  His volume status is being managed by hemodialysis., no fluid overload on physical exam. Discharge to a facility later today if possible.  Ena Dawley, MD 08/25/2019

## 2019-08-25 NOTE — NC FL2 (Signed)
[place] LEVEL OF CARE SCREENING TOOL     IDENTIFICATION  Patient Name: [name] Birthdate: 1941-02-05 Sex: male Admission Date (Current Location): 08/06/2019  [facility] and [place] Number:  Engineering geologist and Address:  The . [facility] [place] [address] [place], [place] [address]      Provider Number: [id number]  Attending Physician Name and Address:  Nelva Bush, MD  Relative Name and Phone Number:       Current Level of Care: Hospital Recommended Level of Care: [place] Prior Approval Number:    Date Approved/Denied:   PASRR Number: [id number]  Discharge Plan: SNF    Current Diagnoses: Patient Active Problem List   Diagnosis Date Noted  . ESRD (end stage renal disease) on dialysis ([place])   . Epistaxis   . Neck pain   . Pressure injury of skin 08/11/2019  . AKI (acute kidney injury) ([place])   . S/P drug eluting coronary stent placement   . Acute non-cardiogenic pulmonary edema (HCC)   . Acute respiratory failure with hypoxia ([place])   . NSTEMI (non-ST elevated myocardial infarction) ([place]) 08/07/2019  . Non-ST elevation (NSTEMI) myocardial infarction ([place])   . Chest pain with high risk of acute coronary syndrome 08/05/2019  . Hematochezia   . Benign neoplasm of transverse colon   . Benign neoplasm of descending colon   . Hypertension 03/27/2018  . Metabolic acidosis 09/47/0962  . Secondary hyperparathyroidism of renal origin ([place]) 01/22/2018  . Vitamin D deficiency 01/22/2018  . Hematuria 11/14/2017  . Urinary retention 08/28/2017  . Closed wedge compression fracture of twelfth thoracic vertebra ([place]) 08/11/2017  . Bilateral carotid artery stenosis 08/01/2017  . Hoarseness of voice 08/01/2017  . History of compression fracture of spine 07/28/2017  . History of stroke 06/11/2017  . Chronic shoulder bursitis, left 07/22/2016  . Cholecystitis, acute 04/21/2016  . Uncontrolled type 2 diabetes mellitus  with stage 4 chronic kidney disease, with long-term current use of insulin ([place]) 09/14/2015  . 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 CAD in native artery 01/20/2015  . Gout 01/20/2015  . Diabetic peripheral neuropathy ([place]) 01/20/2015  . H/O malignant neoplasm of colon 01/20/2015  . Adult hypothyroidism 01/20/2015  . Hyperlipidemia associated with type 2 diabetes mellitus ([place]) 01/20/2015  . High potassium 05/19/2014  . 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 Hypertension in stage 4 chronic kidney disease due to type 2 diabetes mellitus ([place]) 07/15/2013    Orientation RESPIRATION BLADDER Height & Weight     Self, Time, Situation, Place  Normal Continent Weight: 199 lb 15.3 oz (90.7 kg) Height:  6\' 2"  (188 cm)  BEHAVIORAL SYMPTOMS/MOOD NEUROLOGICAL BOWEL NUTRITION STATUS      Continent Diet(see DC summary)  AMBULATORY STATUS COMMUNICATION OF NEEDS Skin   Extensive Assist Verbally Surgical wounds, Other (Comment)(closed right neck, liquid skin adhesive; closed left arm, liquid skin adhesive; open wound butt crack, foam dressing changed every 3 days and lift every shift to assess)                       Personal Care Assistance Level of Assistance  Bathing, Feeding, Dressing Bathing Assistance: Limited assistance Feeding assistance: Independent Dressing Assistance: Limited assistance     Functional Limitations Info  Speech     Speech Info: Impaired    SPECIAL CARE FACTORS FREQUENCY  PT (By licensed PT), OT (By licensed OT), Speech therapy  PT Frequency: 5x/wk OT Frequency: 5x/wk     Speech Therapy Frequency: 5x/wk      Contractures Contractures Info: Not present    Additional Factors Info  Code Status, Allergies, Insulin Sliding Scale Code Status Info: Full Allergies Info: Ace Inhibitors, Quinapril   Insulin Sliding Scale Info: 0-15 units 3x/day with meals; 0-5 units daily at bed; Lantus 15 units 2x/day        Current Medications (08/25/2019):  This is the current hospital active medication list Current Facility-Administered Medications  Medication Dose Route Frequency Provider Last Rate Last Dose  . 0.9 %  sodium chloride infusion (Manually program via Guardrails IV Fluids)   Intravenous Once Pearson Grippe B, MD      . acetaminophen (TYLENOL) tablet 650 mg  650 mg Oral Q4H PRN Mosetta Anis, MD   650 mg at 08/25/19 0853  . albuterol (PROVENTIL) (2.5 MG/3ML) 0.083% nebulizer solution 2.5 mg  2.5 mg Nebulization Q2H PRN Mosetta Anis, MD   2.5 mg at 08/17/19 2053  . aspirin EC tablet 81 mg  81 mg Oral Daily Mosetta Anis, MD   81 mg at 08/25/19 1312  . atorvastatin (LIPITOR) tablet 80 mg  80 mg Oral q1800 Mosetta Anis, MD   80 mg at 08/24/19 1801  . calcitRIOL (ROCALTROL) capsule 0.5 mcg  0.5 mcg Oral Daily Rosita Fire, MD   0.5 mcg at 08/25/19 0945  . Chlorhexidine Gluconate Cloth 2 % PADS 6 each  6 each Topical Q0600 Rexene Agent, MD   6 each at 08/25/19 [phone]  . clopidogrel (PLAVIX) tablet 75 mg  75 mg Oral Daily Mosetta Anis, MD   75 mg at 08/25/19 1314  . Darbepoetin Alfa (ARANESP) injection 100 mcg  100 mcg Intravenous Q Wed-HD Rosita Fire, MD   100 mcg at 08/25/19 0945  . feeding supplement (NEPRO CARB STEADY) liquid 237 mL  237 mL Oral TID BM End, Christopher, MD   237 mL at 08/24/19 1812  . ferric gluconate (NULECIT) 125 mg in sodium chloride 0.9 % 100 mL IVPB  125 mg Intravenous Q M,W,F-HD Rosita Fire, MD   Stopped at 08/25/19 1149  . fludrocortisone (FLORINEF) tablet 0.1 mg  0.1 mg Oral Daily Satira Sark, MD   0.1 mg at 08/25/19 1313  . food thickener (THICK IT) powder   Oral PRN End, Harrell Gave, MD      . guaiFENesin (ROBITUSSIN) 100 MG/5ML solution 100 mg  5 mL Oral Q4H PRN Mosetta Anis, MD   100 mg at 08/13/19 1011  . heparin injection 1,000 Units  1,000 Units Dialysis PRN Mosetta Anis, MD   1,000 Units at 08/25/19 1145  . insulin aspart  (novoLOG) injection 0-15 Units  0-15 Units Subcutaneous TID WC Mosetta Anis, MD   2 Units at 08/24/19 1834  . insulin aspart (novoLOG) injection 0-5 Units  0-5 Units Subcutaneous QHS Mosetta Anis, MD   3 Units at 08/18/19 [phone]  . insulin glargine (LANTUS) injection 15 Units  15 Units Subcutaneous BID Croitoru, Mihai, MD   15 Units at 08/25/19 1315  . lactated ringers infusion   Intravenous Continuous Barnet Glasgow, MD 10 mL/hr at 08/20/19 [phone]    . levothyroxine (SYNTHROID) tablet 300 mcg  300 mcg Oral Q0600 Mosetta Anis, MD   300 mcg at 08/25/19 2025  . loperamide (IMODIUM) capsule 2 mg  2 mg Oral PRN Mosetta Anis, MD   2  mg at 08/11/19 1315  . midodrine (PROAMATINE) tablet 10 mg  10 mg Oral TID WC Rosita Fire, MD   10 mg at 08/25/19 1313  . multivitamin (RENA-VIT) tablet 1 tablet  1 tablet Oral QHS End, Christopher, MD   1 tablet at 08/22/19 2121  . nitroGLYCERIN (NITROSTAT) SL tablet 0.4 mg  0.4 mg Sublingual Q5 Min x 3 PRN Mosetta Anis, MD      . ondansetron [facility]) injection 4 mg  4 mg Intravenous Q6H PRN Mosetta Anis, MD   4 mg at 08/20/19 1435  . oxyCODONE-acetaminophen (PERCOCET/ROXICET) 5-325 MG per tablet 1 tablet  1 tablet Oral Q4H PRN Early, Arvilla Meres, MD   1 tablet at 08/22/19 1652  . pantoprazole (PROTONIX) EC tablet 40 mg  40 mg Oral Daily Mosetta Anis, MD   40 mg at 08/25/19 1313  . Resource ThickenUp Clear   Oral PRN End, Harrell Gave, MD      . sevelamer carbonate (RENVELA) tablet 800 mg  800 mg Oral TID WC Rosita Fire, MD   800 mg at 08/24/19 1801  . sodium chloride (OCEAN) 0.65 % nasal spray 1 spray  1 spray Each Nare PRN Mosetta Anis, MD      . sodium chloride flush (NS) 0.9 % injection 10-40 mL  10-40 mL Intracatheter Q12H Mosetta Anis, MD   10 mL at 08/22/19 1016  . sodium chloride flush (NS) 0.9 % injection 10-40 mL  10-40 mL Intracatheter PRN Mosetta Anis, MD   10 mL at 08/17/19 4403  . torsemide (DEMADEX) tablet 100 mg  100 mg Oral Daily  Pearson Grippe B, MD   100 mg at 08/25/19 1313  . warfarin (COUMADIN) tablet 1 mg  1 mg Oral ONCE-1800 Pierce, Dwayne A, RPH      . Warfarin - Pharmacist Dosing Inpatient   Does not apply q1800 Henri Medal Miranda at 08/24/19 1800     Discharge Medications: Please see discharge summary for a list of discharge medications.  Relevant Imaging Results:  Relevant Lab Results:   Additional Information SS#: [id number]  Geralynn Ochs, LCSW

## 2019-08-26 DIAGNOSIS — Z5320 Procedure and treatment not carried out because of patient's decision for unspecified reasons: Secondary | ICD-10-CM

## 2019-08-26 DIAGNOSIS — Z515 Encounter for palliative care: Secondary | ICD-10-CM

## 2019-08-26 DIAGNOSIS — Z5329 Procedure and treatment not carried out because of patient's decision for other reasons: Secondary | ICD-10-CM

## 2019-08-26 DIAGNOSIS — Z7189 Other specified counseling: Secondary | ICD-10-CM

## 2019-08-26 LAB — PROTIME-INR
INR: 2.4 — ABNORMAL HIGH (ref 0.8–1.2)
Prothrombin Time: 25.6 seconds — ABNORMAL HIGH (ref 11.4–15.2)

## 2019-08-26 LAB — CULTURE, BLOOD (ROUTINE X 2)
Culture: NO GROWTH
Culture: NO GROWTH
Special Requests: ADEQUATE
Special Requests: ADEQUATE

## 2019-08-26 LAB — MAGNESIUM: Magnesium: 1.9 mg/dL (ref 1.7–2.4)

## 2019-08-26 LAB — GLUCOSE, CAPILLARY
Glucose-Capillary: 90 mg/dL (ref 70–99)
Glucose-Capillary: 142 mg/dL — ABNORMAL HIGH (ref 70–99)
Glucose-Capillary: 111 mg/dL — ABNORMAL HIGH (ref 70–99)

## 2019-08-26 MED ORDER — LORAZEPAM 1 MG PO TABS: 2.0000 mg | ORAL_TABLET | ORAL | Status: DC | PRN

## 2019-08-26 MED ORDER — WARFARIN SODIUM 1 MG PO TABS: 1.0000 mg | ORAL_TABLET | Freq: Once | ORAL | Status: DC

## 2019-08-26 MED ORDER — OXYCODONE HCL 5 MG/5ML PO SOLN: 5.0000 mg | ORAL | Status: DC | PRN

## 2019-08-26 NOTE — TOC Progression Note (Signed)
Transition of Care [facility]) - Progression Note    Patient Details  Name: [name] MRN: [id number] Date of Birth: 31-Dec-1940  [facility] CM/SW [place], [place] Phone Number: 08/26/2019, 11:01 AM  Clinical Narrative:   CSW received a call from patient's daughter, [name]. [name] told CSW that her mother said that a doctor came in last night and told them that he wasn't progressing and it didn't seem like the care was making a difference, that the prognosis was poor. [name] says she doesn't know what to do with that information, but she took the day off of work today because she couldn't focus. CSW noting that palliative consult is in for discussion with the patient and family. CSW to hold off on SNF placement until goals in place.    Expected Discharge Plan: Skilled Nursing Facility Barriers to Discharge: Continued Medical Work up, Ship broker  Expected Discharge Plan and Services Expected Discharge Plan: [place]   Discharge Planning Services: CM Consult Post Acute Care Choice: IP Rehab Living arrangements for the past 2 months: Single Family Home                                       Social Determinants of Health (SDOH) Interventions    Readmission Risk Interventions Readmission Risk Prevention Plan 08/12/2019  Transportation Screening Complete  PCP or Specialist Appt within 3-5 Days Not Complete  Not Complete comments Continued medical workup; not stable for discharge  [place] or [place] Complete  Social Work Consult for [place] Planning/Counseling Complete  Palliative Care Screening Not Applicable  Medication Review Press photographer) Complete  Some recent data might be hidden

## 2019-08-26 NOTE — Progress Notes (Signed)
ANTICOAGULATION CONSULT NOTE - Follow Up Consult  Pharmacy Consult for warfarin Indication: atrial fibrillation (CHADS2VASc = 7)  Allergies  Allergen Reactions  . Ace Inhibitors Other (See Comments)    Reaction:  Raises potassium   . Quinapril Rash and Other (See Comments)    hyperkalemia    Patient Measurements: Height: 6\' 2"  (188 cm) Weight: 199 lb 1.2 oz (90.3 kg) IBW/kg (Calculated) : 82.2 Heparin Dosing Weight: 90.7 kg  Vital Signs: Temp: 98.5 F (36.9 C) (11/12 0754) Temp Source: Oral (11/12 0754) BP: 86/54 (11/12 0754) Pulse Rate: 64 (11/12 0754)  Labs: Recent Labs    08/24/19 0236 08/25/19 0236 08/25/19 0814 08/26/19 0307  HGB 7.3*  --  8.0*  --   HCT 23.4*  --  25.5*  --   PLT 129*  --  136*  --   LABPROT 34.2* 27.7*  --  25.6*  INR 3.5* 2.6*  --  2.4*  HEPARINUNFRC 0.24*  --   --   --   CREATININE 5.03* 6.99*  --  4.24*    Estimated Creatinine Clearance: 16.4 mL/min (A) (by C-G formula based on SCr of 4.24 mg/dL (H)).   Assessment: 78 yo M started on heparin for NSTEMI, s/p PCI with DES to SVG-RCA on 10/23 and continued for Afib (CHADS2VASC = 7). No anticoagulation prior to admission. Warfarin was started 10/29, then held starting 11/2 for AV Fistula placement. While warfarin was being held for AV fistula placement and patient was started on IV heparin. AV fistula was placed on 11/6 and vascular recommended resuming warfarin.   INR down to 2.4 this morning. Warfarin held on 11/10, low dose given last night. Patient appears to be sensitive to warfarin after one 5mg  dose. Will give small dose tonight.   Goal of Therapy:  INR 2-3  Monitor platelets by anticoagulation protocol: Yes  Plan:  Warfarin 1mg  again tonight Will watch INR trend closely  Erin Hearing PharmD., BCPS Clinical Pharmacist 08/26/2019 9:47 AM

## 2019-08-26 NOTE — Progress Notes (Signed)
[facility] NEPHROLOGY PROGRESS NOTE  Assessment/ Plan: Pt is a 78 y.o. yo male  W/ pmh of CAD s/p CABG, DM, HTN, HLD, CKD IV, who presented w/ NSTEMI, now s/p cardiac cath complicated by perforated SVG to RCA graft.   # ESRD - On MWF HD schedule. Vascular surgery placed TDC and AV fistula 11/6. Received dialysis yesterday. Will augment for hypotension. Pt put out less urine yesterday after starting torsemide.  Pt stating he doesn't feel like interventions are helping and would like to be made 'comfortable'.  - consult palliative care for goals of care and likely transition to comfort care. - switched code status to DNR  - midodrine 10mg  TID, consider increasing.  Florinef started,  - renal navigator making arrangements for outpatient HD, pending transition to comfort care.  - CIR denied, pt now awaiting SNF placement.  - continues to experience pain at the cath site. Mildly erythematous, and exquisitely tender. Pt continues to be afebrile with no leukocytosis.  Continue to monitor for infection.  - some urine ouptut, torsemide added to help increase UOP.   # NSTEMI/CAD: s/p cardiac cath with perforation.  Cardiology following. Currently on DAPT.   #Hypotension: consistently hypotensive since tunneled cath placement.  Still Having dizziness upon standing and DOE but able to tolerate moderate HD  - midodrine 10mg  TID, consider increasing to 12.5mg  if necessary - florinef BID  # new onset a fib/flutter: off beta blocker.  - coumadin resumed - aspirin, plavix continued for recent DES intervention and NSTEMI.  - managed per cardiology  #anemia: iron sat 20%. IV iron and ESA during dialysis.  Monitor CBC. Hgb 8.0 this AM.    # Secondary hyperparathyroidism: . PTH elevated at 535,, consistent with secondary hyperPTH.  - sevelamer 800 TID   Subjective:  Spoke with patient this morning who stated he does not feel like dialysis is helping. He states he is open to talking to  the palliative care team about goals of care and states he would like to be made 'comfortable'.  Also stated he would not like to receive life saving treatments such as chest compressions/defibrilator/intubation.   Objective: Physical Exam: General:sitting up in bed, coughing.  Pt coughed up two whole pills at one point. Nurse stated she had not recently given him any whole pills.  Heart:RRR, s1s2 nl.  Lungs:clear b/l, mild tachypnea and increased WOB. Persistent cough.   Extremities:No edema Dialysis Access: tunneled HD cath, right IJ. Site of HD cath insertion unchanged from yesterday, mild erythema.   Vital signs in last 24 hours: Vitals:   08/25/19 1620 08/25/19 2021 08/26/19 0011 08/26/19 0409  BP: (!) 81/52 95/66 (!) 94/54 (!) 91/55  Pulse: 68 64 62 63  Resp: 14 17 19 20   Temp: 97.9 F (36.6 C) 98.1 F (36.7 C) 98 F (36.7 C) 98.5 F (36.9 C)  TempSrc: Oral Oral Oral Oral  SpO2:  97% 90% 90%  Weight:    90.3 kg  Height:       Weight change: 0.4 kg  Intake/Output Summary (Last 24 hours) at 08/26/2019 0720 Last data filed at 08/25/2019 2145 Gross per 24 hour  Intake -  Output 756 ml  Net -756 ml     Labs: Basic Metabolic Panel: Recent Labs  Lab 08/24/19 0236 08/25/19 0236 08/26/19 0307  NA 136 138 136  K 3.4* 3.7 3.8  CL 100 102 97*  CO2 25 25 26   GLUCOSE 80 169* 106*  BUN 19 27* 11  CREATININE 5.03* 6.99* 4.24*  CALCIUM 8.2* 8.4* 8.7*  PHOS 4.0 5.7* 4.4   Liver Function Tests: Recent Labs  Lab 08/24/19 0236 08/25/19 0236 08/26/19 0307  ALBUMIN 2.4* 2.3* 2.6*   No results for input(s): LIPASE, AMYLASE in the last 168 hours. No results for input(s): AMMONIA in the last 168 hours. CBC: Recent Labs  Lab 08/21/19 0227 08/22/19 0318 08/23/19 0300 08/24/19 0236 08/25/19 0814  WBC 12.9* 11.1* 10.5 7.8 10.1  HGB 8.3* 8.4* 8.4* 7.3* 8.0*  HCT 27.2* 27.6* 27.0* 23.4* 25.5*  MCV 106.7* 108.7* 106.3* 104.0* 106.7*  PLT 184 193 166 129* 136*    Cardiac Enzymes: No results for input(s): CKTOTAL, CKMB, CKMBINDEX, TROPONINI in the last 168 hours. CBG: Recent Labs  Lab 08/25/19 0643 08/25/19 1310 08/25/19 1619 08/25/19 2121 08/26/19 0632  GLUCAP 154* 107* 164* 131* 90    Iron Studies: No results for input(s): IRON, TIBC, TRANSFERRIN, FERRITIN in the last 72 hours. Studies/Results: No results found.  Medications: Infusions: . ferric gluconate (FERRLECIT/NULECIT) IV Stopped (08/25/19 1149)  . lactated ringers 10 mL/hr at 08/20/19 8891    Scheduled Medications: . sodium chloride   Intravenous Once  . aspirin EC  81 mg Oral Daily  . atorvastatin  80 mg Oral q1800  . calcitRIOL  0.5 mcg Oral Daily  . Chlorhexidine Gluconate Cloth  6 each Topical Q0600  . clopidogrel  75 mg Oral Daily  . darbepoetin (ARANESP) injection - DIALYSIS  100 mcg Intravenous Q Wed-HD  . feeding supplement (NEPRO CARB STEADY)  237 mL Oral TID BM  . fludrocortisone  0.1 mg Oral Daily  . insulin aspart  0-15 Units Subcutaneous TID WC  . insulin aspart  0-5 Units Subcutaneous QHS  . insulin glargine  15 Units Subcutaneous BID  . levothyroxine  300 mcg Oral Q0600  . midodrine  10 mg Oral TID WC  . multivitamin  1 tablet Oral QHS  . pantoprazole  40 mg Oral Daily  . sevelamer carbonate  800 mg Oral TID WC  . sodium chloride flush  10-40 mL Intracatheter Q12H  . torsemide  100 mg Oral Daily  . Warfarin - Pharmacist Dosing Inpatient   Does not apply q1800    have reviewed scheduled and prn medications.   Benay Pike 08/26/2019,7:20 AM  LOS: 20 days  Pager: [phone]

## 2019-08-26 NOTE — Consult Note (Signed)
Consultation Note Date: 08/26/2019   Patient Name: [name]  DOB: [birth year]  MRN: [id number]  Age / Sex: 78 y.o., male  PCP: James Hess, MD Referring Physician: Nelva Bush, MD  Reason for Consultation: Establishing goals of care  HPI/Patient Profile: 78 y.o. male  with past medical history of CAD s/p CABG, DM2, HTN, HLD, CKD, prior CVA admitted on 08/06/2019 with NSTEMI. Underwent cardiac cath with known risk to his poor renal status showing 100% occlusion of his RCA. Cath was complicated by perforation of SVG vein graft during stent placement. Recovery and admission further complicated by acute on chronic renal failure progressing to dialysis dependence. Patient now with orthostatic hypotension, hypotension during dialysis, severe deconditioning and dysphagia. Palliative medicine consulted for [place].   Clinical Assessment and Goals of Care: Met at the bedside with patient, his wife- and his daughterRomie Ruiz on phone.  Patient very clearly and without much discussion stated he just wanted to go home. He stated he felt that he was dying, and he didn't want to spend his dying days in the hospital, or in a nursing facility.  He states dialysis is very hard for him, and does not help him feel better- in fact makes him feel worse. He does not wish to continue dialysis.  We discussed that with stopping dialysis his prognosis would be limited to weeks.  We discussed Hospice services at home vs going to hospice house. [name] wants to spend as much time at home- hopefully to die at home- as possible. Being able to spend a few days with his 70 year old granddaughter is most important to him.   Primary Decision Maker PATIENT    SUMMARY OF RECOMMENDATIONS -Care manager referral to arrange Hospice services at home -Family is hopeful for patient to be discharged tomorrow -Symptom management- patient with  no complaints at this time but we did discuss that SOB, confusion, pain may start to occur and will order Oxycodone concentrated liquid 36m q2hr prn for now, as well as lorazepam 146mtablet q4hr prn for anxiety -Liberalize diet for comfort feeding    Code Status/Advance Care Planning:  DNR  Palliative Prophylaxis:   Frequent Pain Assessment  Additional Recommendations (Limitations, Scope, Preferences):  Avoid Hospitalization, Minimize Medications and Initiate Comfort Feeding  Prognosis:    < 2 weeks due to NSTEMI, debility, renal failure with plan to stop dialysis, home with Hospice  Discharge Planning: Home with Hospice  Primary Diagnoses: Present on Admission: . Non-ST elevation (NSTEMI) myocardial infarction ([facility]. NSTEMI (non-ST elevated myocardial infarction) ([facility] [place]. Hyperlipidemia associated with type 2 diabetes mellitus ([facility]. Hypertension in stage 4 chronic kidney disease due to type 2 diabetes mellitus ([facility]  I have reviewed the medical record, interviewed the patient and family, and examined the patient. The following aspects are pertinent.  Past Medical History:  Diagnosis Date  . Blood transfusion without reported diagnosis    patient unaware of receiving blood unless it was during surgery and he was not told  .  Cancer [facility]) 2001   Colon resection  . CAP (community acquired pneumonia) 08/11/2017  . Carotid stenosis, symptomatic, with infarction ([place]) 07/17/2017  . Chronic kidney disease 11/2017   stage IV CKD per nephrologist.  . Complication of anesthesia    raspy voice since carotid endarterectomy 07/17/17. paralysis of vocal chords  . Coronary artery disease   . Diabetes mellitus without complication ([place])   . Dysrhythmia    patient unaware of any irregular heart rhythms  . Hyperlipidemia   . Hypertension   . Hypothyroidism   . Kidney mass 2019  . Myocardial infarction ([place]) 1997  . Peripheral vascular disease ([place])   . Pneumonia 2014, 2018    developed after surgery 2018  . Stroke ([place]) 07/2017   mild stroke and then had carotid endarterectomy  . Thyroid disease   . Wears dentures    full upper  . Weight loss 2019   patient has lost over 60 pounds since 07/2017.    Social History   Socioeconomic History  . Marital status: Married    Spouse name: Not on file  . Number of children: 2  . Years of education: some college  . Highest education level: 12th grade  Occupational History  . Occupation: retired  Scientific laboratory technician  . Financial resource strain: Not hard at all  . Food insecurity    Worry: Never true    Inability: Never true  . Transportation needs    Medical: No    Non-medical: No  Tobacco Use  . Smoking status: Former Smoker    Packs/day: 1.50    Years: 44.00    Pack years: 66.00    Types: Cigarettes    Quit date: 06/24/1996    Years since quitting: 23.1  . Smokeless tobacco: Never Used  . Tobacco comment: smoking cessation materials not required  Substance and Sexual Activity  . Alcohol use: Yes    Alcohol/week: 0.0 standard drinks    Comment: rare  . Drug use: No  . Sexual activity: Not Currently  Lifestyle  . Physical activity    Days per week: 0 days    Minutes per session: 0 min  . Stress: Only a little  Relationships  . Social Herbalist on phone: Patient refused    Gets together: Patient refused    Attends religious service: Patient refused    Active member of club or organization: Patient refused    Attends meetings of clubs or organizations: Patient refused    Relationship status: Married  Other Topics Concern  . Not on file  Social History Narrative  . Not on file   Family History  Problem Relation Age of Onset  . Diabetes Mother   . Heart disease Mother   . Heart disease Father   . Cancer Father        lung   Scheduled Meds: . sodium chloride   Intravenous Once  . aspirin EC  81 mg Oral Daily  . atorvastatin  80 mg Oral q1800  . calcitRIOL  0.5 mcg Oral Daily  .  Chlorhexidine Gluconate Cloth  6 each Topical Q0600  . clopidogrel  75 mg Oral Daily  . darbepoetin (ARANESP) injection - DIALYSIS  100 mcg Intravenous Q Wed-HD  . feeding supplement (NEPRO CARB STEADY)  237 mL Oral TID BM  . fludrocortisone  0.1 mg Oral Daily  . insulin aspart  0-15 Units Subcutaneous TID WC  . insulin aspart  0-5 Units Subcutaneous QHS  .  insulin glargine  15 Units Subcutaneous BID  . levothyroxine  300 mcg Oral Q0600  . midodrine  10 mg Oral TID WC  . multivitamin  1 tablet Oral QHS  . pantoprazole  40 mg Oral Daily  . sevelamer carbonate  800 mg Oral TID WC  . sodium chloride flush  10-40 mL Intracatheter Q12H  . torsemide  100 mg Oral Daily  . warfarin  1 mg Oral ONCE-1800  . Warfarin - Pharmacist Dosing Inpatient   Does not apply q1800   Continuous Infusions: . ferric gluconate (FERRLECIT/NULECIT) IV Stopped (08/25/19 1149)  . lactated ringers 10 mL/hr at 08/20/19 0928   PRN Meds:.acetaminophen, albuterol, food thickener, guaiFENesin, heparin, loperamide, LORazepam, nitroGLYCERIN, ondansetron (ZOFRAN) IV, oxyCODONE, oxyCODONE-acetaminophen, Resource ThickenUp Clear, sodium chloride, sodium chloride flush Medications Prior to Admission:  Prior to Admission medications   Medication Sig Start Date End Date Taking? Authorizing Provider  acetaminophen (TYLENOL) 500 MG tablet Take 1,000 mg every 6 (six) hours as needed by mouth for moderate pain or headache.    [provider]  atenolol (TENORMIN) 50 MG tablet TAKE 1 TABLET BY MOUTH ONCE DAILY Patient taking differently: Take 50 mg by mouth daily.  10/30/18   James Hess, MD  atorvastatin (LIPITOR) 40 MG tablet TAKE 1 TABLET BY MOUTH ONCE DAILY AT  6PM Patient taking differently: Take 40 mg by mouth daily at 6 PM.  12/03/18   James Hess, MD  Blood Glucose Monitoring Suppl (FIFTY50 GLUCOSE METER 2.0) w/Device KIT 1 Device by Other route 2 (two) times daily. 12/08/17   [provider]   calcitRIOL (ROCALTROL) 0.5 MCG capsule Take 0.5 mcg by mouth daily. 07/05/19   [provider]  clopidogrel (PLAVIX) 75 MG tablet Take 1 tablet by mouth once daily Patient taking differently: Take 75 mg by mouth daily.  05/19/19   James Hess, MD  EQ ASPIRIN ADULT LOW DOSE 81 MG EC tablet TAKE 1  BY MOUTH ONCE DAILY Patient taking differently: Take 81 mg by mouth daily.  11/04/18   Algernon Huxley, MD  glucose blood (RELION PRIME TEST) test strip 1 strip by Other route 2 (two) times daily. 07/09/17   [provider]  heparin 5000 UNIT/ML injection Inject 1 mL (5,000 Units total) into the skin every 8 (eight) hours. 08/07/19   Awilda Bill, NP  hydrALAZINE (APRESOLINE) 20 MG/ML injection Inject 0.5 mLs (10 mg total) into the vein every 20 (twenty) minutes as needed (high blood pressure). 08/06/19   Awilda Bill, NP  insulin NPH Human (HUMULIN N,NOVOLIN N) 100 UNIT/ML injection Inject 0.25 mLs (25 Units total) into the skin 2 (two) times daily. Patient taking differently: Inject 30 Units into the skin 2 (two) times daily.  08/15/17   Vaughan Basta, MD  insulin regular (NOVOLIN R,HUMULIN R) 100 units/mL injection Inject 12 Units into the skin 2 (two) times daily.     [provider]  levothyroxine (SYNTHROID, LEVOTHROID) 300 MCG tablet Take 300 mcg by mouth daily before breakfast.     [provider]  nitroGLYCERIN 0.2 mg/mL infusion Inject 0-200 mcg/min into the vein continuous. 08/06/19   Awilda Bill, NP  promethazine (PHENERGAN) 25 MG/ML injection Inject 0.25 mLs (6.25 mg total) into the vein every 6 (six) hours as needed for nausea or vomiting. 08/06/19   Awilda Bill, NP  sodium bicarbonate 650 MG tablet Take 650 mg by mouth 2 (two) times daily. 06/19/18   [provider]  Sodium Bicarbonate-Dextrose (SODIUM BICARBONATE 150 MEQ IN DEXTROSE 5% 1000 ML) 150-5 MEQ/L-% Inject 1,000 mLs (150 mEq total) into the vein continuous. 08/06/19    Awilda Bill, NP  sodium chloride 0.9 % infusion Inject 250 mLs into the vein as needed (for IV line care(Saline / Heparin Lock)). 08/06/19   Awilda Bill, NP  sodium chloride flush (NS) 0.9 % SOLN Inject 3 mLs into the vein every 12 (twelve) hours. 08/06/19   Awilda Bill, NP  sodium chloride flush (NS) 0.9 % SOLN Inject 3 mLs into the vein as needed. 08/06/19   Awilda Bill, NP  ticagrelor (BRILINTA) 90 MG TABS tablet Take 1 tablet (90 mg total) by mouth 2 (two) times daily. 08/07/19   Awilda Bill, NP  valACYclovir (VALTREX) 1000 MG tablet Take 1 tablet (1,000 mg total) by mouth 3 (three) times daily. 10/16/18   James Hess, MD   Allergies  Allergen Reactions  . Ace Inhibitors Other (See Comments)    Reaction:  Raises potassium   . Quinapril Rash and Other (See Comments)    hyperkalemia   Review of Systems  Constitutional: Positive for activity change and fatigue.  Respiratory: Negative for shortness of breath.   Cardiovascular: Negative for chest pain.    Physical Exam Vitals signs and nursing note reviewed.  Constitutional:      Appearance: He is ill-appearing.     Comments: frail  Pulmonary:     Effort: Pulmonary effort is normal.  Skin:    Coloration: Skin is pale.     Comments: Scattered bruising on hands and arms  Psychiatric:        Mood and Affect: Mood normal.        Judgment: Judgment normal.     Vital Signs: BP (!) 89/57 (BP Location: Right Arm)   Pulse 65   Temp 98.7 F (37.1 C) (Oral)   Resp (!) 24   Ht 6' 2" (1.88 m)   Wt 90.3 kg   SpO2 93%   BMI 25.56 kg/m  Pain Scale: 0-10 POSS *See Group Information*: 1-Acceptable,Awake and alert Pain Score: 0-No pain   SpO2: SpO2: 93 % O2 Device:SpO2: 93 % O2 Flow Rate: .O2 Flow Rate (L/min): 2 L/min  IO: Intake/output summary:   Intake/Output Summary (Last 24 hours) at 08/26/2019 1602 Last data filed at 08/25/2019 2145 Gross per 24 hour  Intake -  Output 400 ml  Net -400 ml     LBM: Last BM Date: 08/23/19 Baseline Weight: Weight: 99 kg Most recent weight: Weight: 90.3 kg     Palliative Assessment/Data: PPS: 20%     Thank you for this consult. Palliative medicine will continue to follow and assist as needed.   Time In: 1500 Time Out: 1615 Time Total: 75 minutes Greater than 50%  of this time was spent counseling and coordinating care related to the above assessment and plan.  Signed by: Mariana Kaufman, AGNP-C Palliative Medicine    Please contact Palliative Medicine Team phone at [phone] for questions and concerns.  For individual provider: See Shea Evans

## 2019-08-26 NOTE — Progress Notes (Signed)
  Speech Language Pathology Treatment: Dysphagia  Patient Details Name: [name] MRN: [id number] DOB: 1941-03-03 Today's Date: 08/26/2019 Time: 1638-4665 SLP Time Calculation (min) (ACUTE ONLY): 20 min  Assessment / Plan / Recommendation Clinical Impression  Following up with patient for diet tolerance after MBS 08/25/19. RN feels patient aspirated on crushed medication in puree this morning while using head turn to right, he had an immediate cough with 1/2 tsp and subsequently coughed for 5 minutes. His O2 sats dropped and required more O2 to recover. RN has been holding PO for remainder of day. Pt is also fearful to eat at this time. Pt had ice chips at bedside and was instructed by RN to eat one at a time with head turned to right. Pt requested to have a palliative consult, he wishes to go home with Hospice care. Palliative will be meeting with family this afternoon. Recommend NPO until consult completed with Palliative. Pt may continue with limited amounts of ice chips for comfort at this time. SLP will follow up.    HPI HPI: Pt is a 78 year old male admitted to [facility] with NSTEMI s/p cardiac cath on 99/35, complicated by a small localized perforation of SVG during stent placement. He was subsequently transferred to [facility].  Pt developed progressive renal failure and associated respiratory failure. SLP was ordered as pt was noted to be coughing iwth meals. Pt had a R CEA in October 2018, after which he had hoarseness and trouble swallowing. [place] admission later that month for PNA, MBS was completed that showed adequate oropharyngeal swallow with no aspiration, occasional trace penetration with thin liquids. ENT performed right thyroplasty 01/05/18, which improved vocal quality, but pt reports no improvements in swallowing. Previous BSE in 2017 also [facility]. PMH also includes: stage IV CKD, weight loss, thyroid disease, CVA, MI, CAD, HTN, HLD, DM, colon ca s/p resection      SLP Plan  Continue with current plan of care       Recommendations  Diet recommendations: NPO Liquids provided via: Teaspoon Medication Administration: Crushed with puree Compensations: (head turned to right)                Plan: Continue with current plan of care       GO                Wynelle Bourgeois., MA, CCC-SLP 08/26/2019, 3:22 PM

## 2019-08-26 NOTE — Plan of Care (Signed)
  Problem: Education: Goal: Knowledge of General Education information will improve Description Including pain rating scale, medication(s)/side effects and non-pharmacologic comfort measures Outcome: Progressing   Problem: Health Behavior/Discharge Planning: Goal: Ability to manage health-related needs will improve Outcome: Progressing   

## 2019-08-26 NOTE — Progress Notes (Signed)
Progress Note  Patient Name: [name] Date of Encounter: 08/26/2019  Primary Cardiologist: Corey Skains, MD   Subjective   The patient denies chest pain or SOB, per nurse he aspirated.  Inpatient Medications    Scheduled Meds: . sodium chloride   Intravenous Once  . aspirin EC  81 mg Oral Daily  . atorvastatin  80 mg Oral q1800  . calcitRIOL  0.5 mcg Oral Daily  . Chlorhexidine Gluconate Cloth  6 each Topical Q0600  . clopidogrel  75 mg Oral Daily  . darbepoetin (ARANESP) injection - DIALYSIS  100 mcg Intravenous Q Wed-HD  . feeding supplement (NEPRO CARB STEADY)  237 mL Oral TID BM  . fludrocortisone  0.1 mg Oral Daily  . insulin aspart  0-15 Units Subcutaneous TID WC  . insulin aspart  0-5 Units Subcutaneous QHS  . insulin glargine  15 Units Subcutaneous BID  . levothyroxine  300 mcg Oral Q0600  . midodrine  10 mg Oral TID WC  . multivitamin  1 tablet Oral QHS  . pantoprazole  40 mg Oral Daily  . sevelamer carbonate  800 mg Oral TID WC  . sodium chloride flush  10-40 mL Intracatheter Q12H  . torsemide  100 mg Oral Daily  . warfarin  1 mg Oral ONCE-1800  . Warfarin - Pharmacist Dosing Inpatient   Does not apply q1800   Continuous Infusions: . ferric gluconate (FERRLECIT/NULECIT) IV Stopped (08/25/19 1149)  . lactated ringers 10 mL/hr at 08/20/19 0928   PRN Meds: acetaminophen, albuterol, food thickener, guaiFENesin, heparin, loperamide, nitroGLYCERIN, ondansetron (ZOFRAN) IV, oxyCODONE-acetaminophen, Resource ThickenUp Clear, sodium chloride, sodium chloride flush   Vital Signs    Vitals:   08/25/19 2021 08/26/19 0011 08/26/19 0409 08/26/19 0754  BP: 95/66 (!) 94/54 (!) 91/55 (!) 86/54  Pulse: 64 62 63 64  Resp: 17 19 20  (!) 22  Temp: 98.1 F (36.7 C) 98 F (36.7 C) 98.5 F (36.9 C) 98.5 F (36.9 C)  TempSrc: Oral Oral Oral Oral  SpO2: 97% 90% 90% 91%  Weight:   90.3 kg   Height:        Intake/Output Summary (Last 24 hours) at 08/26/2019  1048 Last data filed at 08/25/2019 2145 Gross per 24 hour  Intake -  Output 756 ml  Net -756 ml   Last 3 Weights 08/26/2019 08/25/2019 08/25/2019  Weight (lbs) 199 lb 1.2 oz 199 lb 15.3 oz 201 lb 4.5 oz  Weight (kg) 90.3 kg 90.7 kg 91.3 kg      Telemetry    Adib, HR 80s, frequent PVCs, trigeminy - Personally Reviewed  ECG    No new - Personally Reviewed  Physical Exam   GEN: No acute distress.   Neck: No JVD Cardiac: Irreg Irreg, no murmurs, rubs, or gallops.  Respiratory: Clear to auscultation bilaterally.; 3-4L O2 GI: Soft, nontender, non-distended  MS: No edema; No deformity. Neuro:  Nonfocal  Psych: Normal affect   Labs    High Sensitivity Troponin:   Recent Labs  Lab 08/04/19 2250 08/05/19 0043 08/05/19 0818 08/05/19 1624  TROPONINIHS 471* 507* 1,266* 1,123*      Chemistry Recent Labs  Lab 08/24/19 0236 08/25/19 0236 08/26/19 0307  NA 136 138 136  K 3.4* 3.7 3.8  CL 100 102 97*  CO2 25 25 26   GLUCOSE 80 169* 106*  BUN 19 27* 11  CREATININE 5.03* 6.99* 4.24*  CALCIUM 8.2* 8.4* 8.7*  ALBUMIN 2.4* 2.3* 2.6*  GFRNONAA 10* 7*  12*  GFRAA 12* 8* 14*  ANIONGAP 11 11 13      Hematology Recent Labs  Lab 08/23/19 0300 08/24/19 0236 08/25/19 0814  WBC 10.5 7.8 10.1  RBC 2.54* 2.25* 2.39*  HGB 8.4* 7.3* 8.0*  HCT 27.0* 23.4* 25.5*  MCV 106.3* 104.0* 106.7*  MCH 33.1 32.4 33.5  MCHC 31.1 31.2 31.4  RDW 16.8* 16.4* 16.5*  PLT 166 129* 136*    BNPNo results for input(s): BNP, PROBNP in the last 168 hours.   DDimer No results for input(s): DDIMER in the last 168 hours.   Radiology    No results found.  Cardiac Studies   Echocardiogram 08/07/2019: 1. Left ventricular ejection fraction, by visual estimation, is 60 to 65%. The left ventricle has normal function. Normal left ventricular size. There is no left ventricular hypertrophy. 2. Global right ventricle has normal systolic function.The right ventricular size is normal. No increase in  right ventricular wall thickness. 3. Left atrial size was normal. 4. Right atrial size was norma 5. Severe mitral annular calcification. Mild calcification of the anterior mitral valve leaflet(s).. No evidence of mitral valve regurgitation. Mild mitral stenosis. 6. The tricuspid valve is normal in structure. Tricuspid valve regurgitation is mild. 7. The aortic valve is tricuspid Aortic valve regurgitation is mild by color flow Doppler. Moderate aortic valve sclerosis/calcification without any evidence of aortic stenosis. 8. The pulmonic valve was normal in structure. Pulmonic valve regurgitation is not visualized by color flow Doppler. 9. Normal pulmonary artery systolic pressure. 10. The inferior vena cava is normal in size with greater than 50% respiratory variability, suggesting right atrial pressure of 3 mmHg. 11. No evidence of pericardial effusion.  Cardiac catheterization 08/06/2019:  Ost RCA to Prox RCA lesion is 100% stenosed.  Ost Cx lesion is 75% stenosed.  Ramus lesion is 75% stenosed.  Dist LM to Prox LAD lesion is 85% stenosed.  Mid Graft lesion is 85% stenosed.  Origin lesion is 100% stenosed.  78 year old male with hypertension hyperlipidemia and known coronary artery bypass surgery in the past with acute on chronic kidney disease needing dialysis treatment. The patient has acute on this set of chest discomfort intermittent over the last 48 hours with elevated troponin consistent with non-ST elevation myocardial infarction.  Left ventricle not injected due to kidney disease  100% stenosis of right coronary artery ostium Distal left main coronary atherosclerosis of 75 to 80% to trifurcation of left anterior descending artery, circumflex artery, and ramus artery Patent mammary graft to the left anterior descending artery Occluded saphenous vein graft to obtuse marginal 1 Patent but significant stenosis and thrombosed proximal graft to right coronary artery   Plan PCI stent placement of graft to right coronary artery Dual antiplatelet therapy Heparin Continue high intensity cholesterol therapy No change in medication management for hypertension control  PCI 08/06/2019: Conclusions: 1. See diagnostic angiogram by Dr. Nehemiah Massed for details of coronary/graft anatomy. 2. Severe disease involving SVG-RCA with 80-90% stenosis. 3. Successful PCI to SVG-RCA using Resolute Onyx 4.0 x 38 mm drug-eluting stent with 10% residual stenosis and TIMI-3 flow. 4. Focal perforation of the SVG-RCA during stent deployment. This was successfully contained with internal tamponade.  Recommendations: 1. Dual antiplatelet therapy with aspirin and ticagrelor for at least 12 months. 2. Aggressive secondary prevention. 3. Transfer to [facility] for ICU monitoring, given focal perforation of SVG-RCA. If the patient were to become hemodynamically unstable or develop worsening anemia, repeat angiography of SVG-RCA and possible covered stent placement would need to  be considered.   Patient Profile     78 y.o. male with a history of CAD s/p CABG, DM, HTN, HLD, CKD stage 4 who presented with an NSTEMI s/p cardiac catheterization complicated by perforated SVG to RCA graft who was transferred from [facility] also with CKD stage 5 requiring HD.  Assessment & Plan    Multivessel CAD s/p CABG with recent NSTEMI and DES intervention to the SVG to RCA (complicated by perforation) - continue ASA and Plavix - Not on BB due to intermittent hypotension - No recurrent CP or SOB - continue Lipitor - Patient is extremely deconditioned. Plan was to discharge patient to CIR but insurance did not approve this. Will consult case management for SNF placement.  Persistent Atrial flutter with variable conduction - HR have been controlled - CHADSVASC = 7 - Coumadin resumed per pharmacy  Orthostatic Hypotension - predominately worse at nighttime. 86/56 this AM in the  right arm. Left with AV fistula - Mostly asymptomatic - Midodrine was increased to 10 mg TID - Florinef was increased to 0.2 mg. - Monitor pressures during HD  CKD stage 5 with intermittent HD - underwent placement of AV fistula, has right IJ catheter as well with mild erythema and is very TTP - Nephrology following - HD today  We will discharge to SNF today.  Ena Dawley, MD 08/26/2019

## 2019-08-26 NOTE — TOC Progression Note (Signed)
Transition of Care (TOC) - Progression Note  James Gibbons RN, BSN Transitions of Care Unit 4E- RN Case Manager [phone]   Patient Details  Name: [name] MRN: [id number] Date of Birth: [birth year]  Transition of Care [facility]) CM/SW Contact  Dahlia Client, Romeo Rabon, RN Phone Number: 08/26/2019, 4:37 PM  Clinical Narrative:    Pt has met with PC and has decided to move towards comfort care- DNR has been placed. CM received referral for home hospice needs- spoke with pt and wife at bedside- - list provided for home hospice agencies- per pt and wife they would like to use "agency on [address] in [place]" on google search this would be [facility]- pt and wife confirm choice- discussed possible DME needs- pt states he has RW and BSC at home, does not want hospital bed at this time- does request a w/c for home. Also talked about transport home- and per pt and wife pt wants to transport home via non emergent EMS. CM will arrange transport via PTAR on day of discharge which plan is for tomorrow once hospice in the home as been confirmed. Call made to Venia Carbon with [facility] for home hospice referral- she will f/u with pt and wife- and confirm hospice eligibility.    Expected Discharge Plan: Skilled Nursing Facility Barriers to Discharge: Barriers Resolved  Expected Discharge Plan and Services Expected Discharge Plan: [place] In-house Referral: Clinical Social Work Discharge Planning Services: CM Consult Post Acute Care Choice: Hospice Living arrangements for the past 2 months: [place]                 DME Arranged: Wheelchair manual(arranged by hospice)         HH Arranged: Disease Management [place] Agency: [facility] Date [place]: 08/26/19 Time [place]: 1637 Representative spoke with at [place]: Anderson Malta with [facility]   Social Determinants of Health (SDOH) Interventions    Readmission Risk  Interventions Readmission Risk Prevention Plan 08/12/2019  Transportation Screening Complete  PCP or Specialist Appt within 3-5 Days Not Complete  Not Complete comments Continued medical workup; not stable for discharge  [place] or [place] Complete  Social Work Consult for [place] Planning/Counseling Complete  Palliative Care Screening Not Applicable  Medication Review Press photographer) Complete  Some recent data might be hidden

## 2019-08-26 NOTE — Progress Notes (Addendum)
[facility]  Referral received for hospice services at home once pt discharges.    Eligibility is pending at this time, under review by [facility] MD.  Confirmed interest with spouse and patient, offered support and answered questions.  Pt will d/c tomorrow once DME is set up.    Pt will need PTAR to transport home.  DME discussed, will order hospital bed with table, and wheelchair.  Thank you, Venia Carbon RN, BSN, [facility] Liaison (in [phone]  **[place], DME ordered, pt will need hospital bed set up before he can discharge home

## 2019-08-26 NOTE — Progress Notes (Signed)
Pt bladder scanned. 336 ml. Will continue to monitor.  Jerald Kief, RN

## 2019-08-26 NOTE — Progress Notes (Signed)
PT Cancellation Note  Patient Details Name: RANDON SOMERA MRN: [id number] DOB: May 23, 1941   Cancelled Treatment:    Reason Eval/Treat Not Completed: Other (comment)  Per RN, patient may be moving to palliative care today and requests that PT hold. Will follow and potentially consider eventual sign off pending medical team advice and prognosis.   Windell Norfolk, DPT, CBIS  Supplemental Physical Therapist [facility]    Pager [phone] Acute Rehab Office [phone]

## 2019-08-26 NOTE — Progress Notes (Signed)
PT bladder scanned and only 325 cc found. Will recheck in 1 hr. Jerald Kief, RN

## 2019-08-27 ENCOUNTER — Encounter (HOSPITAL_COMMUNITY): Payer: Self-pay | Admitting: Physician Assistant

## 2019-08-27 ENCOUNTER — Inpatient Hospital Stay (HOSPITAL_COMMUNITY): Payer: Medicare HMO

## 2019-08-27 HISTORY — PX: IR REMOVAL TUN CV CATH W/O FL: IMG2289

## 2019-08-27 LAB — RENAL FUNCTION PANEL
Albumin: 2.6 g/dL — ABNORMAL LOW (ref 3.5–5.0)
Anion gap: 13 (ref 5–15)
BUN: 11 mg/dL (ref 8–23)
CO2: 26 mmol/L (ref 22–32)
Calcium: 8.7 mg/dL — ABNORMAL LOW (ref 8.9–10.3)
Chloride: 97 mmol/L — ABNORMAL LOW (ref 98–111)
Creatinine, Ser: 4.24 mg/dL — ABNORMAL HIGH (ref 0.61–1.24)
GFR calc Af Amer: 14 mL/min — ABNORMAL LOW (ref >60)
GFR calc non Af Amer: 12 mL/min — ABNORMAL LOW (ref >60)
Glucose, Bld: 106 mg/dL — ABNORMAL HIGH (ref 70–99)
Phosphorus: 4.4 mg/dL (ref 2.5–4.6)
Potassium: 3.8 mmol/L (ref 3.5–5.1)
Sodium: 136 mmol/L (ref 135–145)

## 2019-08-27 LAB — GLUCOSE, CAPILLARY: Glucose-Capillary: 103 mg/dL — ABNORMAL HIGH (ref 70–99)

## 2019-08-27 MED ORDER — LORAZEPAM 2 MG PO TABS: 2.0000 mg | ORAL_TABLET | ORAL | 0 refills | Status: DC | PRN

## 2019-08-27 MED ORDER — TORSEMIDE 100 MG PO TABS: 100.0000 mg | ORAL_TABLET | Freq: Every day | ORAL | 3 refills | Status: AC

## 2019-08-27 MED ORDER — LOPERAMIDE HCL 2 MG PO CAPS: 2.0000 mg | ORAL_CAPSULE | ORAL | 0 refills | Status: AC | PRN

## 2019-08-27 MED ORDER — MIDODRINE HCL 10 MG PO TABS: 10.0000 mg | ORAL_TABLET | Freq: Three times a day (TID) | ORAL | 6 refills | Status: AC

## 2019-08-27 MED ORDER — CHLORHEXIDINE GLUCONATE 4 % EX LIQD
CUTANEOUS | Status: AC
  Filled 2019-08-27: qty 15

## 2019-08-27 MED ORDER — NITROGLYCERIN 0.4 MG SL SUBL: 0.4000 mg | SUBLINGUAL_TABLET | SUBLINGUAL | 3 refills | Status: AC | PRN

## 2019-08-27 MED ORDER — OXYCODONE HCL 5 MG/5ML PO SOLN: 5.0000 mg | ORAL | 0 refills | Status: DC | PRN

## 2019-08-27 MED ORDER — FLUDROCORTISONE ACETATE 0.1 MG PO TABS: 0.1000 mg | ORAL_TABLET | Freq: Every day | ORAL | 3 refills | Status: AC

## 2019-08-27 MED ORDER — LIDOCAINE HCL 1 % IJ SOLN
INTRAMUSCULAR | Status: AC
  Filled 2019-08-27: qty 20

## 2019-08-27 MED ORDER — STARCH (THICKENING) PO POWD: ORAL | 0 refills | Status: AC

## 2019-08-27 MED ORDER — SEVELAMER CARBONATE 800 MG PO TABS: 800.0000 mg | ORAL_TABLET | Freq: Three times a day (TID) | ORAL | 3 refills | Status: AC

## 2019-08-27 NOTE — Progress Notes (Signed)
Manufacturing engineer [facility]) Hospital Liaison Note:  Update on DME delivery:  Spoke to patient spouse who confirmed that DME is scheduled to arrive between 4-5pm today.  Matilde Sprang [facility] HLT [phone]

## 2019-08-27 NOTE — Procedures (Signed)
Pre procedural Dx: ESRD Post procedural Dx: Same  Successful removal of tunneled HD catheter  EBL: None No immediate complications.  Please see imaging section of Epic for full dictation.  Joaquim Nam PA-C 08/27/2019 1:51 PM

## 2019-08-27 NOTE — Progress Notes (Signed)
PT Cancellation Note  Patient Details Name: [name] MRN: [id number] DOB: 10/01/1941   Cancelled Treatment:    Reason Eval/Treat Not Completed: Other (comment) Per notes, pt has transitioned to comfort care and going to be discharging home on hospice. No longer needs PT services. Hospice to order all necessary DME. Will sign off for now. Thanks.   Marguarite Arbour A Shalissa Easterwood 08/27/2019, 7:24 AM Marisa Severin, PT, DPT Acute Rehabilitation Services Pager [phone] Office [phone]

## 2019-08-27 NOTE — Progress Notes (Signed)
[facility] NEPHROLOGY PROGRESS NOTE  Assessment/ Plan: Pt is a 78 y.o. yo male  W/ pmh of CAD s/p CABG, DM, HTN, HLD, CKD IV, who presented w/ NSTEMI, now s/p cardiac cath complicated by perforated SVG to RCA graft. After attempting dialysis, pt has decided to transition to hospice.   # ESRD - On MWF HD schedule. Vascular surgery placed TDC and AV fistula 11/6. Pt transitioned to comfort care, will be discharged today - remove HD cath prior to discharge.    Subjective:  Patient is okay with his decision to start hospice. States 'we've tried everything we could'.    Objective: Physical Exam: General:laying in bed.  Heart:RRR, s1s2 nl.  Extremities:No edema Dialysis Access: tunneled HD cath, right IJ.  Vital signs in last 24 hours: Vitals:   08/26/19 1714 08/26/19 2140 08/27/19 0009 08/27/19 0650  BP: 106/62 (!) 94/59 93/61 (!) 88/61  Pulse: 84 67 69 76  Resp: (!) 28 (!) 22 (!) 24 (!) 24  Temp:  98.2 F (36.8 C) 97.6 F (36.4 C) 98.2 F (36.8 C)  TempSrc:  Oral Oral Oral  SpO2: 98% 92% 92% 93%  Weight:      Height:       Weight change:  No intake or output data in the 24 hours ending 08/27/19 1218   Labs: Basic Metabolic Panel: Recent Labs  Lab 08/24/19 0236 08/25/19 0236 08/26/19 0307  NA 136 138 136  K 3.4* 3.7 3.8  CL 100 102 97*  CO2 25 25 26   GLUCOSE 80 169* 106*  BUN 19 27* 11  CREATININE 5.03* 6.99* 4.24*  CALCIUM 8.2* 8.4* 8.7*  PHOS 4.0 5.7* 4.4   Liver Function Tests: Recent Labs  Lab 08/24/19 0236 08/25/19 0236 08/26/19 0307  ALBUMIN 2.4* 2.3* 2.6*   No results for input(s): LIPASE, AMYLASE in the last 168 hours. No results for input(s): AMMONIA in the last 168 hours. CBC: Recent Labs  Lab 08/21/19 0227 08/22/19 0318 08/23/19 0300 08/24/19 0236 08/25/19 0814  WBC 12.9* 11.1* 10.5 7.8 10.1  HGB 8.3* 8.4* 8.4* 7.3* 8.0*  HCT 27.2* 27.6* 27.0* 23.4* 25.5*  MCV 106.7* 108.7* 106.3* 104.0* 106.7*  PLT 184 193 166 129*  136*   Cardiac Enzymes: No results for input(s): CKTOTAL, CKMB, CKMBINDEX, TROPONINI in the last 168 hours. CBG: Recent Labs  Lab 08/25/19 2121 08/26/19 0632 08/26/19 1125 08/26/19 2141 08/27/19 0647  GLUCAP 131* 90 142* 111* 103*    Iron Studies: No results for input(s): IRON, TIBC, TRANSFERRIN, FERRITIN in the last 72 hours. Studies/Results: No results found.  Medications: Infusions: . ferric gluconate (FERRLECIT/NULECIT) IV Stopped (08/25/19 1149)  . lactated ringers 10 mL/hr at 08/20/19 3244    Scheduled Medications: . sodium chloride   Intravenous Once  . aspirin EC  81 mg Oral Daily  . atorvastatin  80 mg Oral q1800  . calcitRIOL  0.5 mcg Oral Daily  . Chlorhexidine Gluconate Cloth  6 each Topical Q0600  . clopidogrel  75 mg Oral Daily  . darbepoetin (ARANESP) injection - DIALYSIS  100 mcg Intravenous Q Wed-HD  . feeding supplement (NEPRO CARB STEADY)  237 mL Oral TID BM  . fludrocortisone  0.1 mg Oral Daily  . insulin aspart  0-15 Units Subcutaneous TID WC  . insulin aspart  0-5 Units Subcutaneous QHS  . insulin glargine  15 Units Subcutaneous BID  . levothyroxine  300 mcg Oral Q0600  . midodrine  10 mg Oral TID WC  . multivitamin  1 tablet Oral QHS  . pantoprazole  40 mg Oral Daily  . sevelamer carbonate  800 mg Oral TID WC  . sodium chloride flush  10-40 mL Intracatheter Q12H  . torsemide  100 mg Oral Daily    have reviewed scheduled and prn medications.   Benay Pike 08/27/2019,12:18 PM  LOS: 21 days  Pager: [phone]

## 2019-08-27 NOTE — Progress Notes (Signed)
Daily Progress Note   Patient Name: [name]       Date: 08/27/2019 DOB: 1941/07/26  Age: 78 y.o. MRN#: [id number] Attending Physician: Nelva Bush, MD Primary Care Physician: Glean Hess, MD Admit Date: 08/06/2019  Reason for Consultation/Follow-up: Establishing goals of care  Subjective: Patient in bed, no symptomatic complaints. Gave emotional support as he discussed that he will be "going to be with his Mom and Dad". He is very eager to get home and spend time with his granddaughter.  Review of Systems  Constitutional: Positive for chills and malaise/fatigue.  Genitourinary:       Retention  All other systems reviewed and are negative.   Length of Stay: 21  Current Medications: Scheduled Meds:  . sodium chloride   Intravenous Once  . aspirin EC  81 mg Oral Daily  . atorvastatin  80 mg Oral q1800  . calcitRIOL  0.5 mcg Oral Daily  . Chlorhexidine Gluconate Cloth  6 each Topical Q0600  . clopidogrel  75 mg Oral Daily  . darbepoetin (ARANESP) injection - DIALYSIS  100 mcg Intravenous Q Wed-HD  . feeding supplement (NEPRO CARB STEADY)  237 mL Oral TID BM  . fludrocortisone  0.1 mg Oral Daily  . insulin aspart  0-15 Units Subcutaneous TID WC  . insulin aspart  0-5 Units Subcutaneous QHS  . insulin glargine  15 Units Subcutaneous BID  . levothyroxine  300 mcg Oral Q0600  . midodrine  10 mg Oral TID WC  . multivitamin  1 tablet Oral QHS  . pantoprazole  40 mg Oral Daily  . sevelamer carbonate  800 mg Oral TID WC  . sodium chloride flush  10-40 mL Intracatheter Q12H  . torsemide  100 mg Oral Daily  . warfarin  1 mg Oral ONCE-1800  . Warfarin - Pharmacist Dosing Inpatient   Does not apply q1800    Continuous Infusions: . ferric gluconate (FERRLECIT/NULECIT)  IV Stopped (08/25/19 1149)  . lactated ringers 10 mL/hr at 08/20/19 0928    PRN Meds: acetaminophen, albuterol, food thickener, guaiFENesin, heparin, loperamide, LORazepam, nitroGLYCERIN, ondansetron (ZOFRAN) IV, oxyCODONE, oxyCODONE-acetaminophen, Resource ThickenUp Clear, sodium chloride, sodium chloride flush  Physical Exam Vitals signs and nursing note reviewed.  Constitutional:      Appearance: He is ill-appearing.  HENT:     Mouth/Throat:  Comments: hypophonic Cardiovascular:     Rate and Rhythm: Normal rate and regular rhythm.     Pulses: Normal pulses.  Pulmonary:     Effort: Pulmonary effort is normal.  Abdominal:     General: There is distension.     Tenderness: There is no abdominal tenderness.  Skin:    General: Skin is warm and dry.     Coloration: Skin is pale.  Neurological:     Motor: Weakness present.             Vital Signs: BP (!) 88/61 (BP Location: Right Arm)   Pulse 76   Temp 98.2 F (36.8 C) (Oral)   Resp (!) 24   Ht 6\' 2"  (1.88 m)   Wt 90.3 kg   SpO2 93%   BMI 25.56 kg/m  SpO2: SpO2: 93 % O2 Device: O2 Device: Room Air O2 Flow Rate: O2 Flow Rate (L/min): 2 L/min  Intake/output summary: No intake or output data in the 24 hours ending 08/27/19 0936 LBM: Last BM Date: 08/23/19 Baseline Weight: Weight: 99 kg Most recent weight: Weight: 90.3 kg       Palliative Assessment/Data: PPS: 20%      Patient Active Problem List   Diagnosis Date Noted  . Patient declines dialysis   . Advanced care planning/counseling discussion   . Goals of care, counseling/discussion   . Palliative care by specialist   . ESRD (end stage renal disease) on dialysis ([place])   . Epistaxis   . Neck pain   . Pressure injury of skin 08/11/2019  . AKI (acute kidney injury) ([place])   . S/P drug eluting coronary stent placement   . Acute non-cardiogenic pulmonary edema (HCC)   . Acute respiratory failure with hypoxia ([place])   . NSTEMI (non-ST elevated myocardial  infarction) ([place]) 08/07/2019  . Non-ST elevation (NSTEMI) myocardial infarction ([place])   . Chest pain with high risk of acute coronary syndrome 08/05/2019  . Hematochezia   . Benign neoplasm of transverse colon   . Benign neoplasm of descending colon   . Hypertension 03/27/2018  . Metabolic acidosis 37/34/2876  . Secondary hyperparathyroidism of renal origin ([place]) 01/22/2018  . Vitamin D deficiency 01/22/2018  . Hematuria 11/14/2017  . Urinary retention 08/28/2017  . Closed wedge compression fracture of twelfth thoracic vertebra ([place]) 08/11/2017  . Bilateral carotid artery stenosis 08/01/2017  . Hoarseness of voice 08/01/2017  . History of compression fracture of spine 07/28/2017  . History of stroke 06/11/2017  . Chronic shoulder bursitis, left 07/22/2016  . Cholecystitis, acute 04/21/2016  . Uncontrolled type 2 diabetes mellitus with stage 4 chronic kidney disease, with long-term current use of insulin ([place]) 09/14/2015  . 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 CAD in native artery 01/20/2015  . Gout 01/20/2015  . Diabetic peripheral neuropathy ([place]) 01/20/2015  . H/O malignant neoplasm of colon 01/20/2015  . Adult hypothyroidism 01/20/2015  . Hyperlipidemia associated with type 2 diabetes mellitus ([place]) 01/20/2015  . High potassium 05/19/2014  . 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 Hypertension in stage 4 chronic kidney disease due to type 2 diabetes mellitus ([place]) 07/15/2013    Palliative Care Assessment & Plan   Patient Profile:  78 y.o. male  with past medical history of CAD s/p CABG, DM2, HTN, HLD, CKD, prior CVA admitted on 08/06/2019 with NSTEMI. Underwent cardiac cath with known risk to his poor renal status showing 100% occlusion of his RCA. Cath was complicated by perforation  of SVG vein graft during stent placement. Recovery and admission further complicated by acute on chronic renal failure progressing to dialysis dependence.  Patient now with orthostatic hypotension, hypotension during dialysis, severe deconditioning and dysphagia. Palliative medicine consulted for [place].   Assessment/Recommendations/Plan   DNR form completed placed on chart  Plan to d/c today- home with Hospice  Goals of Care and Additional Recommendations:  Limitations on Scope of Treatment: Avoid Hospitalization, Minimize Medications, Initiate Comfort Feeding, No Artificial Feeding, No Hemodialysis, No IV Fluids, No Lab Draws and No Surgical Procedures  Code Status:  DNR  Prognosis:   < 2 weeks due to ESRD- declining dialysis, transition to comfort/Hospice, plan home with Hospice  Discharge Planning:  Home with Hospice  Care plan was discussed with patient.  Thank you for allowing the Palliative Medicine Team to assist in the care of this patient.   Time In: 0900 Time Out: 0940 Total Time 40 mins Prolonged Time Billed no      Greater than 50%  of this time was spent counseling and coordinating care related to the above assessment and plan.  Mariana Kaufman, AGNP-C Palliative Medicine   Please contact Palliative Medicine Team phone at [phone] for questions and concerns.

## 2019-08-27 NOTE — TOC Transition Note (Signed)
Transition of Care [facility]) - CM/SW Discharge Note Marvetta Gibbons RN, BSN Transitions of Care Unit 4E- RN Case Manager [phone]   Patient Details  Name: [name] MRN: [id number] Date of Birth: 01/12/1941  Transition of Care [facility]) CM/SW Contact:  Dawayne Patricia, RN Phone Number: 08/27/2019, 5:30 PM   Clinical Narrative:    Hospice services have been confirmed- awaiting delivery of DME to home- that is scheduled between 4-5 this afternoon- pt will transport via [facility]- call made to [facility] to arrange transport and paperwork placed on shadow chart along with GOLD DNR.  Final next level of care: Home w Hospice Care Barriers to Discharge: Barriers Resolved   Patient Goals and CMS Choice Patient states their goals for this hospitalization and ongoing recovery are:: to go home and be comfortable and not be in pain CMS Medicare.gov Compare Post Acute Care list provided to:: Patient Choice offered to / list presented to : Patient, Spouse  Discharge Placement             Home with Hospice          Discharge Plan and Services In-house Referral: Clinical Social Work Discharge Planning Services: CM Consult Post Acute Care Choice: Hospice          DME Arranged: Wheelchair manual(arranged by hospice)         HH Arranged: Disease Management [place] Agency: [facility] Date [place]: 08/26/19 Time [place]: 1637 Representative spoke with at [place]: Anderson [place] with [facility]  Social Determinants of Health (SDOH) Interventions     Readmission Risk Interventions Readmission Risk Prevention Plan 08/12/2019  Transportation Screening Complete  PCP or Specialist Appt within 3-5 Days Not Complete  Not Complete comments Continued medical workup; not stable for discharge  [place] or [place] Complete  Social Work Consult for [place] Planning/Counseling Complete  Palliative Care Screening Not Applicable  Medication Review Designer, fashion/clothing) Complete  Some recent data might be hidden

## 2019-08-27 NOTE — Discharge Summary (Signed)
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)   NSTEMI (non-ST elevated myocardial infarction) ([place])   AKI (acute kidney injury) ([place])   S/P drug eluting coronary stent placement   Acute non-cardiogenic pulmonary edema (HCC)   Acute respiratory failure with hypoxia (HCC)   Pressure injury of skin   Neck pain   ESRD (end stage renal disease) on dialysis [facility])   Epistaxis   Patient declines dialysis   Advanced care planning/counseling discussion   Goals of care, counseling/discussion   Palliative care by specialist    Diagnostic Studies/Procedures    Echocardiogram `08/05/2019: 1. Left ventricular ejection fraction, by visual estimation, is 55 to 60%. The left ventricle has normal function. There is moderately increased left ventricular hypertrophy.  2. Global right ventricle has normal systolic function.The right ventricular size is normal. No increase in right ventricular wall thickness.  3. Left atrial size was normal.  4. Right atrial size was normal.  5. The mitral valve is normal in structure. Mild mitral valve regurgitation.  6. The tricuspid valve is normal in structure. Tricuspid valve regurgitation is trivial.  7. The aortic valve is bicuspid Aortic valve regurgitation is trivial by color flow Doppler. Mild aortic valve sclerosis without stenosis.  8. The pulmonic valve  was normal in structure. Pulmonic valve regurgitation is trivial by color flow Doppler.  9. Normal pulmonary artery systolic pressure.  Diagnostic left heart catheterization 08/06/2019:  Ost RCA to Prox RCA lesion is 100% stenosed.  Ost Cx lesion is 75% stenosed.  Ramus lesion is 75% stenosed.  Dist LM to Prox LAD lesion is 85% stenosed.  Mid Graft lesion is 85% stenosed.  Origin lesion is 100% stenosed.   78 year old male with hypertension hyperlipidemia and known coronary artery bypass surgery in the past with acute on chronic kidney disease needing dialysis treatment.  The patient has acute on this set of chest discomfort intermittent over the last 48 hours with elevated troponin consistent with non-ST elevation myocardial infarction.  Left ventricle not injected due to kidney disease  100% stenosis of right coronary artery ostium Distal left main coronary atherosclerosis of 75 to 80% to trifurcation of left anterior descending artery, circumflex artery, and ramus artery Patent mammary graft to the left anterior descending artery Occluded saphenous vein graft to obtuse marginal 1 Patent but significant stenosis and thrombosed proximal graft to right coronary artery  Plan PCI stent placement of graft to right coronary artery Dual antiplatelet therapy Heparin Continue  high intensity cholesterol therapy No change in medication management for hypertension control  Coronary stent intervention 08/06/2019: Conclusions: 1. See diagnostic angiogram by Dr. Nehemiah Massed for details of coronary/graft anatomy. 2. Severe disease involving SVG-RCA with 80-90% stenosis. 3. Successful PCI to SVG-RCA using Resolute Onyx 4.0 x 38 mm drug-eluting stent with 10% residual stenosis and TIMI-3 flow. 4. Focal perforation of the SVG-RCA during stent deployment.  This was successfully contained with internal tamponade.  Recommendations:  1. Dual antiplatelet therapy with aspirin and ticagrelor for  at least 12 months. 2. Aggressive secondary prevention. 3. Transfer to [facility] for ICU monitoring, given focal perforation of SVG-RCA.  If the patient were to become hemodynamically unstable or develop worsening anemia, repeat angiography of SVG-RCA and possible covered stent placement would need to be considered.  Echocardiogram (limited) 08/07/2019: 1. Left ventricular ejection fraction, by visual estimation, is 60 to 65%. The left ventricle has normal function. Normal left ventricular size. There is no left ventricular hypertrophy.  2. Global right ventricle has normal systolic function.The right ventricular size is normal. No increase in right ventricular wall thickness.  3. Left atrial size was normal.  4. Right atrial size was norma  5. Severe mitral annular calcification. Mild calcification of the anterior mitral valve leaflet(s).. No evidence of mitral valve regurgitation. Mild mitral stenosis.  6. The tricuspid valve is normal in structure. Tricuspid valve regurgitation is mild.  7. The aortic valve is tricuspid Aortic valve regurgitation is mild by color flow Doppler. Moderate aortic valve sclerosis/calcification without any evidence of aortic stenosis.  8. The pulmonic valve was normal in structure. Pulmonic valve regurgitation is not visualized by color flow Doppler.  9. Normal pulmonary artery systolic pressure. 10. The inferior vena cava is normal in size with greater than 50% respiratory variability, suggesting right atrial pressure of 3 mmHg. 11. No evidence of pericardial effusion. _____________   History of Present Illness     [name] is a 78 y.o. male with a history of CAD s/p CABG, DM, HTN, HLD, CKD stage 4 who presented with an NSTEMI s/p cardiac catheterization complicated by perforated SVG to RCA graft who was transferred from [facility] also with CKD stage 5 requiring HD.  Hospital Course     Consultants: Nephrology, palliative care, vascular surgery,  PCCM  78 year old white male with a significant history of coronary artery disease as mentioned below.  He was admitted to [facility] initially with chief complaint of chest pain, elevated troponin and working diagnosis of non-ST elevation MI.  He went for cardiac catheterization at [facility], catheterization was notable for chronic occlusion of the SVG to OM with severe distal disease of the RCA which was felt to be the culprit lesion.  Intervention was complicated by focal perforation of the SVG during stent placement, contrast extravasation was controlled with balloon tamponade, however contained extravasation with still evident and because of this he was transferred to [facility].  His hospital course was complicated by new onset atrial flutter, for which he was started on coumadin for stroke ppx, though rates were well controlled without AV nodal blocking agents. Additionally, he has a history of stage IV chronic kidney disease, on 10/24 he was started on bicarbonate drip as well as nitro infusion for increased hypertension.  Urine output was minimal.  Limited echocardiogram was obtained on 10/24 this was negative for pericardial effusion.  He did have recurrent chest pain the a.m. of 10/25 treated with nitroglycerin.  On 10/26 his renal function had continued to worsen.  Creatinine peaked at 10.34.  He was felt to be volume overloaded with worsening tachypnea, cough, oxygen requirements.  Chest x-ray showing pulmonary edema. Critical care was consulted for potential need for escalated pulmonary support. Nephrology was consulted at that time and CRRT was initiated with subsequent transition to HD. Vascular surgery was consulted and placed tunneled catheter and left arm AV fistula for ongoing HD needs. Unfortunately the patient continued to decline and  reported little improvement in symptoms despite dialysis. After a goals of care discussion with Palliative care, the patient wished to stop dialysis and  return home with hospice care. Warfarin was discontinued at the time of discharge. Social work and case management assisted with home needs during this transition to comfort care.   Did the patient have an acute coronary syndrome (MI, NSTEMI, STEMI, etc) this admission?:  Yes                               AHA/ACC Clinical Performance & Quality Measures: 4. Aspirin prescribed? - Yes 5. ADP Receptor Inhibitor (Plavix/Clopidogrel, Brilinta/Ticagrelor or Effient/Prasugrel) prescribed (includes medically managed patients)? - Yes 6. Beta Blocker prescribed? - No - hypotension limits GDT 7. High Intensity Statin (Lipitor 40-10m or Crestor 20-481m prescribed? - Yes 8. EF assessed during THIS hospitalization? - Yes 9. For EF <40%, was ACEI/ARB prescribed? - No - Reason:  hypotension limits GDT 10. For EF <40%, Aldosterone Antagonist (Spironolactone or Eplerenone) prescribed? - Not Applicable (EF >/= 4036%11. Cardiac Rehab Phase II ordered (Included Medically managed Patients)? - No - home with hospice care   _____________  Discharge Vitals Blood pressure (!) 88/61, pulse 76, temperature 98.2 F (36.8 C), temperature source Oral, resp. rate (!) 24, height 6' 2"  (1.88 m), weight 90.3 kg, SpO2 93 %.  Filed Weights   08/25/19 0745 08/25/19 1140 08/26/19 0409  Weight: 91.3 kg 90.7 kg 90.3 kg    Labs & Radiologic Studies    CBC Recent Labs    08/25/19 0814  WBC 10.1  HGB 8.0*  HCT 25.5*  MCV 106.7*  PLT 13144  Basic Metabolic Panel Recent Labs    08/25/19 0236 08/26/19 0307  NA 138 136  K 3.7 3.8  CL 102 97*  CO2 25 26  GLUCOSE 169* 106*  BUN 27* 11  CREATININE 6.99* 4.24*  CALCIUM 8.4* 8.7*  MG 2.1 1.9  PHOS 5.7* 4.4   Liver Function Tests Recent Labs    08/25/19 0236 08/26/19 0307  ALBUMIN 2.3* 2.6*   No results for input(s): LIPASE, AMYLASE in the last 72 hours. High Sensitivity Troponin:   Recent Labs  Lab 08/04/19 2250 08/05/19 0043 08/05/19 0818  08/05/19 1624  TROPONINIHS 471* 507* 1,266* 1,123*    BNP Invalid input(s): POCBNP D-Dimer No results for input(s): DDIMER in the last 72 hours. Hemoglobin A1C No results for input(s): HGBA1C in the last 72 hours. Fasting Lipid Panel No results for input(s): CHOL, HDL, LDLCALC, TRIG, CHOLHDL, LDLDIRECT in the last 72 hours. Thyroid Function Tests No results for input(s): TSH, T4TOTAL, T3FREE, THYROIDAB in the last 72 hours.  Invalid input(s): FREET3 _____________  Dg Cervical Spine 2 Or 3 Views  Result Date: 08/12/2019 CLINICAL DATA:  Neck pain without known injury. EXAM: CERVICAL SPINE - 2-3 VIEW COMPARISON:  None. FINDINGS: Minimal grade 1 anterolisthesis of C5-6 is noted secondary to posterior facet joint hypertrophy. No fracture is noted. Disc spaces are well-maintained. No prevertebral soft tissue swelling is noted. IMPRESSION: Minimal grade 1 anterolisthesis of C5-6 secondary to posterior facet joint hypertrophy. No other abnormality seen in the cervical spine. Electronically Signed   By: JaMarijo Conception.D.   On: 08/12/2019 10:30   [place]  Result Date: 08/16/2019 CLINICAL DATA:  Acute kidney injury EXAM: RENAL / URINARY TRACT ULTRASOUND COMPLETE COMPARISON:  11/05/2018 FINDINGS: Right Kidney: Renal measurements: 9.7 x 4.5 x 3.3 cm =  volume: 76 mL. Increased renal cortical echogenicity. No mass or hydronephrosis visualized. Left Kidney: Renal measurements: 8.9 x 3.7 x 4.0 cm = volume: 68 mL. Increased renal cortical echogenicity. No mass or hydronephrosis visualized. Bladder: Decompressed by Foley catheter. Other: None. IMPRESSION: 1. Negative for obstructive uropathy. 2. Findings of chronic medical renal disease. Electronically Signed   By: Davina Poke M.D.   On: 08/16/2019 17:19   Dg Chest Port 1 View  Result Date: 08/20/2019 CLINICAL DATA:  Central line placement EXAM: PORTABLE CHEST 1 VIEW COMPARISON:  08/12/2019 FINDINGS: There has been interval replacement of a  large-bore right neck multi lumen vascular catheter, tip advanced now near the superior cavoatrial junction. Otherwise unchanged examination with cardiomegaly status post median sternotomy and without acute abnormality of the lungs AP portable projection. IMPRESSION: There has been interval replacement of a large-bore right neck multi lumen vascular catheter, tip advanced now near the superior cavoatrial junction. Electronically Signed   By: Eddie Candle M.D.   On: 08/20/2019 16:33   Dg Chest Port 1 View  Result Date: 08/12/2019 CLINICAL DATA:  Short of breath EXAM: PORTABLE CHEST 1 VIEW COMPARISON:  08/10/2019 FINDINGS: Right jugular central venous catheter tip proximal SVC unchanged. Prior CABG. Negative for heart failure. Elevated right hemidiaphragm with right lower lobe atelectasis improved from the prior study. No edema or effusion. Improved aeration in the left lung base. IMPRESSION: Improved aeration in the lung bases. Electronically Signed   By: Franchot Gallo M.D.   On: 08/12/2019 13:58   Dg Chest Port 1 View  Result Date: 08/10/2019 CLINICAL DATA:  Pulmonary vascular congestion. EXAM: PORTABLE CHEST 1 VIEW COMPARISON:  08/09/2019.  04/20/2016. 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 Cardiomegaly. Mild pulmonary venous congestion. Mild bibasilar atelectasis. Minimal right base pleural thickening again noted consistent scarring. No pneumothorax. Prior thoracic vertebroplasty. IMPRESSION: 1.  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 2.  Prior CABG.  Cardiomegaly.  No pulmonary venous congestion. 3. Mild bibasilar atelectasis. Tiny right pleural effusion versus scarring. Electronically Signed   By: Marcello Moores  Register   On: 08/10/2019 12:15   Dg Chest Port 1 View  Result Date: 08/09/2019 CLINICAL DATA:  Central venous catheter placement, type II diabetes mellitus, hypertension, coronary artery disease post NSTEMI, CABG, and coronary PTCA, stage IV  chronic kidney disease, colon cancer EXAM: PORTABLE CHEST 1 VIEW COMPARISON:  Portable exam 1423 hours compared to 0806 hours FINDINGS: RIGHT jugular central venous catheter with tip projecting over proximal SVC. Enlargement of cardiac silhouette post CABG and coronary stenting. Atherosclerotic calcification aorta. Slight pulmonary vascular congestion. Interstitial infiltrates in the mid to lower lungs favor pulmonary edema. Mild RIGHT basilar atelectasis. No pleural effusion or pneumothorax. Bones unremarkable. IMPRESSION: No pneumothorax following RIGHT jugular line placement. RIGHT basilar atelectasis and probable mild pulmonary edema. Electronically Signed   By: Lavonia Dana M.D.   On: 08/09/2019 14:34   Dg Chest Port 1 View  Result Date: 08/09/2019 CLINICAL DATA:  Cough, wheezing EXAM: PORTABLE CHEST - 1 VIEW COMPARISON:  08/06/2019 FINDINGS: Some interval improvement in the pulmonary vascular congestion seen previously. Persistent elevated right hemidiaphragm with bronchovascular crowding at the lung bases right worse than left. Heart size upper limits normal for technique. Aortic Atherosclerosis (ICD10-170.0). Previous CABG. No effusion. No pneumothorax. Sternotomy wires. IMPRESSION: 1. Improving pulmonary vascular congestion. 2. Persistent elevated right hemidiaphragm. 3. Previous CABG. Electronically Signed   By: Lucrezia Europe M.D.   On: 08/09/2019  08:23   Dg Chest Port 1 View  Result Date: 08/06/2019 CLINICAL DATA:  Chest pain post cath lab EXAM: PORTABLE CHEST 1 VIEW COMPARISON:  08/04/2019, 08/11/2017 FINDINGS: Post sternotomy changes. Cardiomegaly with vascular congestion and mild diffuse interstitial opacities slightly increased and perhaps related to mild edema. Elevated right diaphragm. Aortic atherosclerosis. No pneumothorax. IMPRESSION: Cardiomegaly with vascular congestion and mild diffuse interstitial opacities suspicious for slight edema. Electronically Signed   By: Donavan Foil M.D.    On: 08/06/2019 19:47   Dg Chest Port 1 View  Result Date: 08/04/2019 CLINICAL DATA:  Chest pain EXAM: PORTABLE CHEST 1 VIEW COMPARISON:  08/10/2017 FINDINGS: Post sternotomy changes. Chronic elevation of right diaphragm. No acute airspace disease or effusion. Stable cardiomediastinal silhouette with aortic atherosclerosis. No pneumothorax. IMPRESSION: No active disease.  Stable chronic elevation of right diaphragm Electronically Signed   By: Donavan Foil M.D.   On: 08/04/2019 23:28   Dg Swallowing Func-speech Pathology  Result Date: 08/13/2019 Objective Swallowing Evaluation: Type of Study: MBS-Modified Barium Swallow Study  Patient Details Name: CHANANYA CANIZALEZ MRN: [id number] Date of Birth: [birth year] Today's Date: 08/13/2019 Time: SLP Start Time (ACUTE ONLY): 73 -SLP Stop Time (ACUTE ONLY): 6599 SLP Time Calculation (min) (ACUTE ONLY): 23 min Past Medical History: Past Medical History: Diagnosis Date  Blood transfusion without reported diagnosis   patient unaware of receiving blood unless it was during surgery and he was not told  Cancer [facility]) 2001  Colon resection  CAP (community acquired pneumonia) 08/11/2017  Carotid stenosis, symptomatic, with infarction ([place]) 07/17/2017  Chronic kidney disease 11/2017  stage IV CKD per nephrologist.  Complication of anesthesia   raspy voice since carotid endarterectomy 07/17/17. paralysis of vocal chords  Coronary artery disease   Diabetes mellitus without complication [facility])   Dysrhythmia   patient unaware of any irregular heart rhythms  Hyperlipidemia   Hypertension   Hypothyroidism   Kidney mass 2019  Myocardial infarction [facility]) 1997  Peripheral vascular disease ([place])   Pneumonia 2014, 2018  developed after surgery 2018  Stroke ([place]) 07/2017  mild stroke and then had carotid endarterectomy  Thyroid disease   Wears dentures   full upper  Weight loss 2019  patient has lost over 60 pounds since 07/2017.  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  COLONOSCOPY WITH PROPOFOL N/A 10/26/2018  Procedure: COLONOSCOPY WITH PROPOFOL;  Surgeon: Lucilla Lame, MD;  Location: [place];  Service: Endoscopy;  Laterality: N/A;  Diabetic - insulin  CORONARY ARTERY BYPASS GRAFT  1997  x 3  CORONARY STENT INTERVENTION N/A 08/06/2019  Procedure: CORONARY STENT INTERVENTION;  Surgeon: Nelva Bush, MD;  Location: [place] CV LAB;  Service: Cardiovascular;  Laterality: N/A;  CYSTOSCOPY W/ RETROGRADES Bilateral 09/03/2017  Procedure: CYSTOSCOPY WITH RETROGRADE PYELOGRAM;  Surgeon: Hollice Espy, MD;  Location: [facility];  Service: Urology;  Laterality: Bilateral;  CYSTOSCOPY W/ RETROGRADES Right 01/28/2018  Procedure: CYSTOSCOPY WITH RETROGRADE PYELOGRAM;  Surgeon: Hollice Espy, MD;  Location: [facility];  Service: Urology;  Laterality: Right;  CYSTOSCOPY W/ URETERAL STENT REMOVAL  08/2017  CYSTOSCOPY WITH BIOPSY Right 01/28/2018  Procedure: CYSTOSCOPY WITH BIOPSY;  Surgeon: Hollice Espy, MD;  Location: [facility];  Service: Urology;  Laterality: Right;  CYSTOSCOPY WITH STENT PLACEMENT Right 09/03/2017  Procedure: CYSTOSCOPY WITH STENT PLACEMENT;  Surgeon: Hollice Espy, MD;  Location: [facility];  Service: Urology;  Laterality: Right;  CYSTOSCOPY WITH STENT PLACEMENT Right 01/28/2018  Procedure: CYSTOSCOPY WITH STENT PLACEMENT;  Surgeon: Hollice Espy, MD;  Location: [facility];  Service: Urology;  Laterality: Right;   CYSTOSCOPY WITH URETEROSCOPY Right 01/28/2018  Procedure: CYSTOSCOPY WITH URETEROSCOPY;  Surgeon: Hollice Espy, MD;  Location: [facility];  Service: Urology;  Laterality: Right;  ENDARTERECTOMY Right 07/17/2017  Procedure: ENDARTERECTOMY CAROTID;  Surgeon: Algernon Huxley, MD;  Location: [facility];  Service: Vascular;  Laterality: Right;  HERNIA REPAIR  2011  Ventral hernia  HOLMIUM LASER APPLICATION N/A [id number]/00/8676  Procedure: HOLMIUM LASER APPLICATION;  Surgeon: Hollice Espy, MD;  Location: [facility];  Service: Urology;  Laterality: N/A;  KNEE SURGERY Left   arthroscopy  KYPHOPLASTY N/A 08/14/2017  Procedure: [id number];  Surgeon: Hessie Knows, MD;  Location: [facility];  Service: Orthopedics;  Laterality: N/A;  LARYNX SURGERY    LEFT HEART CATH AND CORS/GRAFTS ANGIOGRAPHY N/A 08/06/2019  Procedure: LEFT HEART CATH AND CORS/GRAFTS ANGIOGRAPHY;  Surgeon: Corey Skains, MD;  Location: [place] CV LAB;  Service: Cardiovascular;  Laterality: N/A;  POLYPECTOMY  10/26/2018  Procedure: POLYPECTOMY INTESTINAL;  Surgeon: Lucilla Lame, MD;  Location: [place];  Service: Endoscopy;;  Descending colon polyp Transverse colon polyps x 3  PROSTATE SURGERY  2002  BPH benign pathology  SPINE SURGERY  1989  Lumbar disc  THYROPLASTY Right 01/05/2018  Procedure: THYROPLASTY;  Surgeon: Beverly Gust, MD;  Location: [facility];  Service: ENT;  Laterality: Right; HPI: Pt is a 78 year old male admitted to [facility] with NSTEMI s/p cardiac cath on 09/98, complicated by a small localized perforation of SVG during stent placement. He was subsequently transferred to [facility].  Pt developed progressive renal failure and associated respiratory failure. SLP was ordered as pt was noted to be coughing iwth meals. Pt had a R CEA in October 2018, after which he had hoarseness and trouble swallowing. [place] admission later that month for PNA, MBS was completed that showed adequate oropharyngeal swallow with no  aspiration, occasional trace penetration with thin liquids. ENT was recommended due to thickening around the epiglottis. Previous BSE in 2017 also [facility]. 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 PMH also includes: stage IV CKD, weight loss, thyroid disease, CVA, MI, CAD, HTN, HLD, DM, colon ca s/p resection  Subjective: pt syas he is having more trouble swallowing than usual Assessment / Plan / Recommendation CHL IP CLINICAL IMPRESSIONS 08/13/2019 Clinical Impression Pt presents with silent aspiration with liquids regardless of consistency and bolus size that appears to be related to incomplet laryngeal vestibule closure. 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 SLP attempted various bolus delivery methods, bolus sizes, and positional strategies to try to increase airway protection. He had the most success when he used a head turn to the right, but he continue to have small amounts enter the airway unless he combined this with a spoonful of honey thick liquids. Pt also verbalized that he did not want to have solid foods, as subjectively this is what seemed hard  at breakfast. Will start a Dys 1 (pureed) diet and honey thick liquids by spoon with a Right head turn. Pt will need to use a spoon-sized bolus and a right head turn or else he will still silently aspirate honey thick liquids. Sign was made for reinforcement and instructions were reviewed with RN. SLP will contineu to follow acutely.  SLP Visit Diagnosis Dysphagia, pharyngeal phase (R13.13) Attention and concentration deficit following -- Frontal lobe and executive function deficit following -- Impact on safety and function Severe aspiration risk;Moderate aspiration risk   CHL IP TREATMENT RECOMMENDATION 08/13/2019 Treatment Recommendations Therapy as outlined in treatment plan below   Prognosis 08/13/2019 Prognosis for Safe Diet  Advancement Fair Barriers to Reach Goals Time post onset Barriers/Prognosis Comment -- CHL IP DIET RECOMMENDATION 08/13/2019 SLP Diet Recommendations Dysphagia 1 (Puree) solids;Honey thick liquids Liquid Administration via Spoon Medication Administration Crushed with puree Compensations Slow rate;Small sips/bites;Other (Comment) Postural Changes Seated upright at 90 degrees   CHL IP OTHER RECOMMENDATIONS 08/13/2019 Recommended Consults -- Oral Care Recommendations Oral care BID Other Recommendations Order thickener from pharmacy;Prohibited food (jello, ice cream, thin soups);Remove water pitcher   CHL IP FOLLOW UP RECOMMENDATIONS 08/13/2019 Follow up Recommendations (No Data)   CHL IP FREQUENCY AND DURATION 08/13/2019 Speech Therapy Frequency (ACUTE ONLY) min 2x/week Treatment Duration 2 weeks      CHL IP ORAL PHASE 08/13/2019 Oral Phase WFL Oral - Pudding Teaspoon -- Oral - Pudding Cup -- Oral - Honey Teaspoon -- Oral - Honey Cup -- Oral - Nectar Teaspoon -- Oral - Nectar Cup -- Oral - Nectar Straw -- Oral - Thin Teaspoon -- Oral - Thin Cup -- Oral - Thin Straw -- Oral - Puree -- Oral - Mech Soft -- Oral - Regular -- Oral - Multi-Consistency -- Oral - Pill -- Oral Phase - Comment --  CHL IP PHARYNGEAL PHASE 08/13/2019 Pharyngeal Phase Impaired Pharyngeal- Pudding Teaspoon -- Pharyngeal -- Pharyngeal- Pudding Cup -- Pharyngeal -- Pharyngeal- Honey Teaspoon Penetration/Aspiration during swallow Pharyngeal Material enters airway, passes BELOW cords without attempt by patient to eject out (silent aspiration) Pharyngeal- Honey Cup Penetration/Aspiration during swallow Pharyngeal Material enters airway, passes BELOW cords without attempt by patient to eject out (silent aspiration) Pharyngeal- Nectar Teaspoon -- Pharyngeal -- Pharyngeal- Nectar Cup Penetration/Aspiration during swallow Pharyngeal Material enters airway, passes BELOW cords without attempt by patient to eject out (silent aspiration) Pharyngeal- Nectar Straw  Penetration/Aspiration during swallow Pharyngeal Material enters airway, passes BELOW cords without attempt by patient to eject out (silent aspiration) Pharyngeal- Thin Teaspoon -- Pharyngeal -- Pharyngeal- Thin Cup Penetration/Aspiration during swallow Pharyngeal Material enters airway, passes BELOW cords without attempt by patient to eject out (silent aspiration) Pharyngeal- Thin Straw Penetration/Aspiration during swallow Pharyngeal Material enters airway, passes BELOW cords without attempt by patient to eject out (silent aspiration) Pharyngeal- Puree WFL Pharyngeal -- Pharyngeal- Mechanical Soft WFL Pharyngeal -- Pharyngeal- Regular -- Pharyngeal -- Pharyngeal- Multi-consistency -- Pharyngeal -- Pharyngeal- Pill -- Pharyngeal -- Pharyngeal Comment --  CHL IP CERVICAL ESOPHAGEAL PHASE 08/13/2019 Cervical Esophageal Phase WFL Pudding Teaspoon -- Pudding Cup -- Honey Teaspoon -- Honey Cup -- Nectar Teaspoon -- Nectar Cup -- Nectar Straw -- Thin Teaspoon -- Thin Cup -- Thin Straw -- Puree -- Mechanical Soft -- Regular -- Multi-consistency -- Pill -- Cervical Esophageal Comment -- Venita Sheffield Nix 08/13/2019, 2:17 PM  Pollyann Glen, M.A. CCC-SLP Acute Rehabilitation Services Pager [phone] Office [phone]             Dg Fluoro Guide Cv  Line-no Report  Result Date: 08/20/2019 Fluoroscopy was utilized by the requesting physician.  No radiographic interpretation.   Vas [place] Upper Extremity Arterial Duplex  Result Date: 08/18/2019 UPPER EXTREMITY DUPLEX STUDY Indications: Doctor complains of weak radial arterial pulse felt. History:     Patient states he had an "A line" in left distal forearm.  Risk Factors:  Hypertension, hyperlipidemia, Diabetes, past history of smoking,                coronary artery disease, prior CVA. Other Factors: Patient has end stage 4 chronic renal disease. Performing Technologist: Estill Batten Mackin : RVT, RDCS (AE), RDMS  Examination Guidelines: A complete evaluation includes B-mode  imaging, spectral Doppler, color Doppler, and power Doppler as needed of all accessible portions of each vessel. Bilateral testing is considered an integral part of a complete examination. Limited examinations for reoccurring indications may be performed as noted.  Left Doppler Findings: +--------+----------+---------+------+--------+  Site     PSV (cm/s) Waveform  Plaque Comments  +--------+----------+---------+------+--------+  Brachial 168        biphasic                   +--------+----------+---------+------+--------+  Radial   154        triphasic                  +--------+----------+---------+------+--------+  Ulnar    135        triphasic                  +--------+----------+---------+------+--------+ Left diameters brachial proximal .66 cm, mid .62 cm, distal .63 cm. Radial artery diameters proximal .42cm, mid .29 cm, distal .24cm and .23 cm. Ulnar artery diameters proximal .31 cm, mid .37cm, distal .29cm.  Left Pre-Dialysis Findings: +-----------------------+----------+--------------------+---------+--------+  Location                PSV (cm/s) Intralum. Diam. (cm) Waveform  Comments  +-----------------------+----------+--------------------+---------+--------+  Brachial Antecub. fossa 117        0.64                 triphasic           +-----------------------+----------+--------------------+---------+--------+  Radial Art at Wrist     173        0.23                 triphasic           +-----------------------+----------+--------------------+---------+--------+  Ulnar Art at Wrist      169        0.29                 triphasic           +-----------------------+----------+--------------------+---------+--------+  Summary:  Left: No obstruction visualized in the left upper extremity There       are calcified walls noted in the arteries of the forearm. *See table(s) above for measurements and observations. Electronically signed by Curt Jews MD on 08/18/2019 at 6:26:03 PM.    Final    Vas [place] Upper Ext  Vein Mapping (pre-op Avf)  Result Date: 08/17/2019 UPPER EXTREMITY VEIN MAPPING  Indications: Pre-access. Comparison Study: No prior study. Performing Technologist: Maudry Mayhew MHA, RDMS, RVT, RDCS  Examination Guidelines: A complete evaluation includes B-mode imaging, spectral Doppler, color Doppler, and power Doppler as needed of all accessible portions of each vessel. Bilateral testing is considered an integral part of a complete examination. Limited examinations for reoccurring indications may be performed as  noted. +-----------------+-------------+----------+--------------+  Left Cephalic     Diameter (cm) Depth (cm)    Findings     +-----------------+-------------+----------+--------------+  Shoulder              0.27         0.75                    +-----------------+-------------+----------+--------------+  Prox upper arm        0.28         0.35                    +-----------------+-------------+----------+--------------+  Mid upper arm         0.32         0.22                    +-----------------+-------------+----------+--------------+  Dist upper arm        0.35         0.33                    +-----------------+-------------+----------+--------------+  Antecubital fossa     0.43         0.41                    +-----------------+-------------+----------+--------------+  Prox forearm          0.21         0.39      branching     +-----------------+-------------+----------+--------------+  Mid forearm           0.29         0.28                    +-----------------+-------------+----------+--------------+  Dist forearm                               not visualized  +-----------------+-------------+----------+--------------+  Wrist                                      not visualized  +-----------------+-------------+----------+--------------+ +-----------------+-------------+----------+--------------+  Left Basilic      Diameter (cm) Depth (cm)    Findings      +-----------------+-------------+----------+--------------+  Prox upper arm                             not visualized  +-----------------+-------------+----------+--------------+  Mid upper arm         0.39                                 +-----------------+-------------+----------+--------------+  Dist upper arm        0.39                                 +-----------------+-------------+----------+--------------+  Antecubital fossa     0.42                                 +-----------------+-------------+----------+--------------+  Prox forearm          0.37                                 +-----------------+-------------+----------+--------------+  Mid forearm           0.14                                 +-----------------+-------------+----------+--------------+  Distal forearm                             not visualized  +-----------------+-------------+----------+--------------+  Wrist                                      not visualized  +-----------------+-------------+----------+--------------+ *See table(s) above for measurements and observations.  Diagnosing physician: Harold Barban MD Electronically signed by Harold Barban MD on 08/17/2019 at 5:38:53 PM.    Final    Disposition   Pt is being discharged home with hospice care.   Follow-up Plans & Appointments    Follow-up Information    Early, Arvilla Meres, MD Follow up in 6 week(s).   Specialties: Vascular Surgery, Cardiology Contact information: [address] [place] [address] [phone]        [facility] Follow up.   Specialty: Hospice and Palliative Medicine Why: Now known as [facility]- referral made for home hospice Contact information: [place] [address] [phone]        Corey Skains, MD Follow up.   Specialty: Cardiology Why: No need to follow-up unless decision changes regarding hospice care and you would like to pursue dialysis Contact information: [facility] [place] [address] [phone]            Discharge Medications   Allergies as of 08/27/2019      Reactions   Ace Inhibitors Other (See Comments)   Reaction:  Raises potassium    Quinapril Rash, Other (See Comments)   hyperkalemia      Medication List    STOP taking these medications   atenolol 50 MG tablet Commonly known as: TENORMIN   atorvastatin 40 MG tablet Commonly known as: LIPITOR   heparin 5000 UNIT/ML injection   hydrALAZINE 20 MG/ML injection Commonly known as: APRESOLINE   nitroGLYCERIN 0.2 mg/mL infusion   promethazine 25 MG/ML injection Commonly known as: PHENERGAN   sodium bicarbonate 150 mEq in dextrose 5% 1000 mL 150-5 MEQ/L-%   sodium chloride 0.9 % infusion   sodium chloride flush 0.9 % Soln Commonly known as: NS   ticagrelor 90 MG Tabs tablet Commonly known as: BRILINTA     TAKE these medications   acetaminophen 500 MG tablet Commonly known as: TYLENOL Take 1,000 mg every 6 (six) hours as needed by mouth for moderate pain or headache.   calcitRIOL 0.5 MCG capsule Commonly known as: ROCALTROL Take 0.5 mcg by mouth daily.   clopidogrel 75 MG tablet Commonly known as: PLAVIX Take 1 tablet by mouth once daily   EQ Aspirin Adult Low Dose 81 MG EC tablet Generic drug: aspirin TAKE 1  BY MOUTH ONCE DAILY What changed: See the new instructions.   Fifty50 Glucose Meter 2.0 w/Device Kit 1 Device by Other route 2 (two) times daily.   fludrocortisone 0.1 MG tablet Commonly known as: FLORINEF Take 1 tablet (0.1 mg total) by mouth daily.   food thickener Powd Commonly known as: THICK IT Add to liquids as needed   insulin NPH Human 100  UNIT/ML injection Commonly known as: NOVOLIN N Inject 0.25 mLs (25 Units total) into the skin 2 (two) times daily. What changed: how much to take   insulin regular 100 units/mL injection Commonly known as: NOVOLIN R Inject 12 Units into the skin 2 (two)  times daily.   levothyroxine 300 MCG tablet Commonly known as: SYNTHROID Take 300 mcg by mouth daily before breakfast.   loperamide 2 MG capsule Commonly known as: IMODIUM Take 1 capsule (2 mg total) by mouth as needed for diarrhea or loose stools.   LORazepam 2 MG tablet Commonly known as: ATIVAN Take 1 tablet (2 mg total) by mouth every 4 (four) hours as needed for up to 5 days for anxiety or sleep.   midodrine 10 MG tablet Commonly known as: PROAMATINE Take 1 tablet (10 mg total) by mouth 3 (three) times daily with meals.   nitroGLYCERIN 0.4 MG SL tablet Commonly known as: NITROSTAT Place 1 tablet (0.4 mg total) under the tongue every 5 (five) minutes x 3 doses as needed for chest pain.   oxyCODONE 5 MG/5ML solution Commonly known as: ROXICODONE Take 5 mLs (5 mg total) by mouth every 2 (two) hours as needed for up to 5 days (Shortness of breath, pain).   ReliOn Prime Test test strip Generic drug: glucose blood 1 strip by Other route 2 (two) times daily.   sevelamer carbonate 800 MG tablet Commonly known as: RENVELA Take 1 tablet (800 mg total) by mouth 3 (three) times daily with meals.   sodium bicarbonate 650 MG tablet Take 650 mg by mouth 2 (two) times daily.   torsemide 100 MG tablet Commonly known as: DEMADEX Take 1 tablet (100 mg total) by mouth daily.   valACYclovir 1000 MG tablet Commonly known as: VALTREX Take 1 tablet (1,000 mg total) by mouth 3 (three) times daily.          Outstanding Labs/Studies   None  Duration of Discharge Encounter   Greater than 30 minutes including physician time.  Signed, Abigail Butts, PA-C 08/27/2019, 1:08 PM

## 2019-08-27 NOTE — Progress Notes (Signed)
1530 Bedside report received. Pt resting in bed comfortably, NAD, pt stated he's waiting on transport to go home. RN called [facility], they stated he wasn't on the list yet, RN will check with case manager about transportation.   17 RN spoke to case manager and she was waiting for his hospital bed to be delivered to the house until she set up transport. Bed should be delivered anytime now. Krisit from case management stated she would set up transport for the patient.   1800 Pt ordered a dinner tray, RN assisted pt in setting up dinner tray. Pt stated he could feed himself.   1915 Pt resting comfortably, NAD, WCTM  2100 Transport here to pick up patient for discharge to home. Pt A&Ox4, VSS, pt denies pain. Foley drained and intact. Pt belongings already taken home by wife [name]. Paperwork sent with transport personal. RN called pt's wife [name] and informed her he would be home in about 33min.

## 2019-08-27 NOTE — Progress Notes (Signed)
SLP Cancellation Note  Patient Details Name: [name] MRN: [id number] DOB: July 27, 1941   Cancelled treatment:       Reason Eval/Treat Not Completed: Other (comment) Note that plan is for patient to discharge with hospice services. Diet has already been liberalized for comfort feeding. SLP to sign off. Please reorder if we can assist.    Talbert Nan 08/27/2019, 10:54 AM  Pollyann Glen, M.A. [place] Acute Environmental education officer [phone] Office [phone]

## 2019-08-27 NOTE — Progress Notes (Signed)
Renal Navigator notes documentation of plan change to home with hospice and has notified [facility] of no further need for OP HD seat.   Alphonzo Cruise, [place] Renal Navigator [phone]

## 2019-08-28 ENCOUNTER — Other Ambulatory Visit: Payer: Self-pay | Admitting: Physician Assistant

## 2019-08-28 MED ORDER — OXYCODONE HCL 5 MG/5ML PO SOLN: 5.0000 mg | ORAL | 0 refills | Status: AC | PRN

## 2019-08-28 MED ORDER — LORAZEPAM 2 MG PO TABS: 2.0000 mg | ORAL_TABLET | ORAL | 0 refills | Status: AC | PRN

## 2019-08-28 NOTE — Progress Notes (Signed)
Patient DC from [facility] yesterday. He was seen by palliative care and decision was made to stop dialysis and go home on Hospice. Unfortunately, his prescriptions were not signed.  Therefore, he was not able to get Lorazepam or Oxycodone filled. I have sent these to his pharmacy.  Hospice will start to see him tomorrow.  Once he is established with Hospice, he will get future refills through their provider. I sent to his pharmacy: Lorazepam 2 mg every 4 hours as needed, #10, no refills Oxycodone 5 mg/mL take 5 mLs every 2 hours as needed, #42mLs, no refills Richardson Dopp, PA-C    08/28/2019 5:46 PM

## 2019-08-30 ENCOUNTER — Telehealth: Payer: Self-pay

## 2019-08-30 NOTE — Telephone Encounter (Signed)
[name] called from Hospice stating patient is being discharged from [facility] today. Wanted to know if Dr. Army Melia can sign hospice orders and be the hospice attending physician.  Called and informed on her VM that Dr. Army Melia cannot be the attending. She will sign hospice orders for patient but wants to know if the attending MD can sign the orders instead. Waiting for a callback.  [name], [name]  Callback# [phone]

## 2019-10-05 ENCOUNTER — Encounter (HOSPITAL_COMMUNITY): Payer: Medicare HMO

## 2019-10-11 ENCOUNTER — Encounter: Payer: Medicare HMO | Admitting: Internal Medicine

## 2019-10-13 ENCOUNTER — Telehealth: Payer: Self-pay

## 2019-10-13 NOTE — Telephone Encounter (Signed)
Spoke to [place] with Palliative care Hospice who saw patient and family upon calling PCP. Colletta [place] states patient is not as well as wife seems and that she has concernss of UTI. Culture pending. Discussed options for BMP and A1CC labs and gave verbal in order to look at kidney function and BS given it has been 6 weeks since D/C Dialysis and Insulin as patient was sent home from hospital in November with no other options and decided to involve hospice for [facility]. Will await lab results being drawn by [facility] tomorrow. [place] [phone]

## 2019-10-14 LAB — BASIC METABOLIC PANEL
BUN: 84 — AB (ref 4–21)
CO2: 15 (ref 13–22)
Chloride: 102 (ref 99–108)
Creatinine: 8.7 — AB (ref 0.6–1.3)
Glucose: 135
Potassium: 4.4 (ref 3.4–5.3)
Sodium: 139 (ref 137–147)

## 2019-10-14 LAB — HEMOGLOBIN A1C: Hemoglobin A1C: 7.2

## 2019-10-14 LAB — COMPREHENSIVE METABOLIC PANEL: Calcium: 8.3 — AB (ref 8.7–10.7)

## 2019-10-18 NOTE — Progress Notes (Signed)
Abstracted labs received from [facility] from 10/14/2019.  [name], CMA

## 2019-10-27 NOTE — Progress Notes (Signed)
ERROR

## 2019-10-28 ENCOUNTER — Telehealth: Payer: Self-pay | Admitting: Urology

## 2019-10-28 NOTE — Telephone Encounter (Signed)
Patient has an upcoming app with you on the 28th he was to get a RUS prior but when called to schedule they were told that Patient's wife/ Patient is very sick, sent home for end of life-will not be scheduling this exam. He may be CX his follow up app not sure yet. I can have [place] call him and check in with him if you want or just wait and see if he calls [place]?   Please advise   Sharyn Lull

## 2019-10-28 NOTE — Telephone Encounter (Signed)
Please express my condolences. Have him call back once things have settled to reschedule his renal ultrasound and appointment.  Hollice Espy, MD

## 2019-11-11 ENCOUNTER — Ambulatory Visit: Payer: Medicare HMO | Admitting: Urology

## 2019-11-15 DEATH — deceased

## 2020-03-31 ENCOUNTER — Encounter (INDEPENDENT_AMBULATORY_CARE_PROVIDER_SITE_OTHER): Payer: Medicare HMO

## 2020-03-31 ENCOUNTER — Ambulatory Visit (INDEPENDENT_AMBULATORY_CARE_PROVIDER_SITE_OTHER): Payer: Medicare HMO | Admitting: Vascular Surgery

## 2020-07-03 ENCOUNTER — Ambulatory Visit: Payer: Medicare HMO

## 2020-08-22 IMAGING — DX DG CHEST 1V PORT
1 series · 1 of 1 positions shown · non-contrast
Comparison: 08/06/2019

CLINICAL DATA: Cough, wheezing

EXAM:
PORTABLE CHEST - 1 VIEW

[chest]
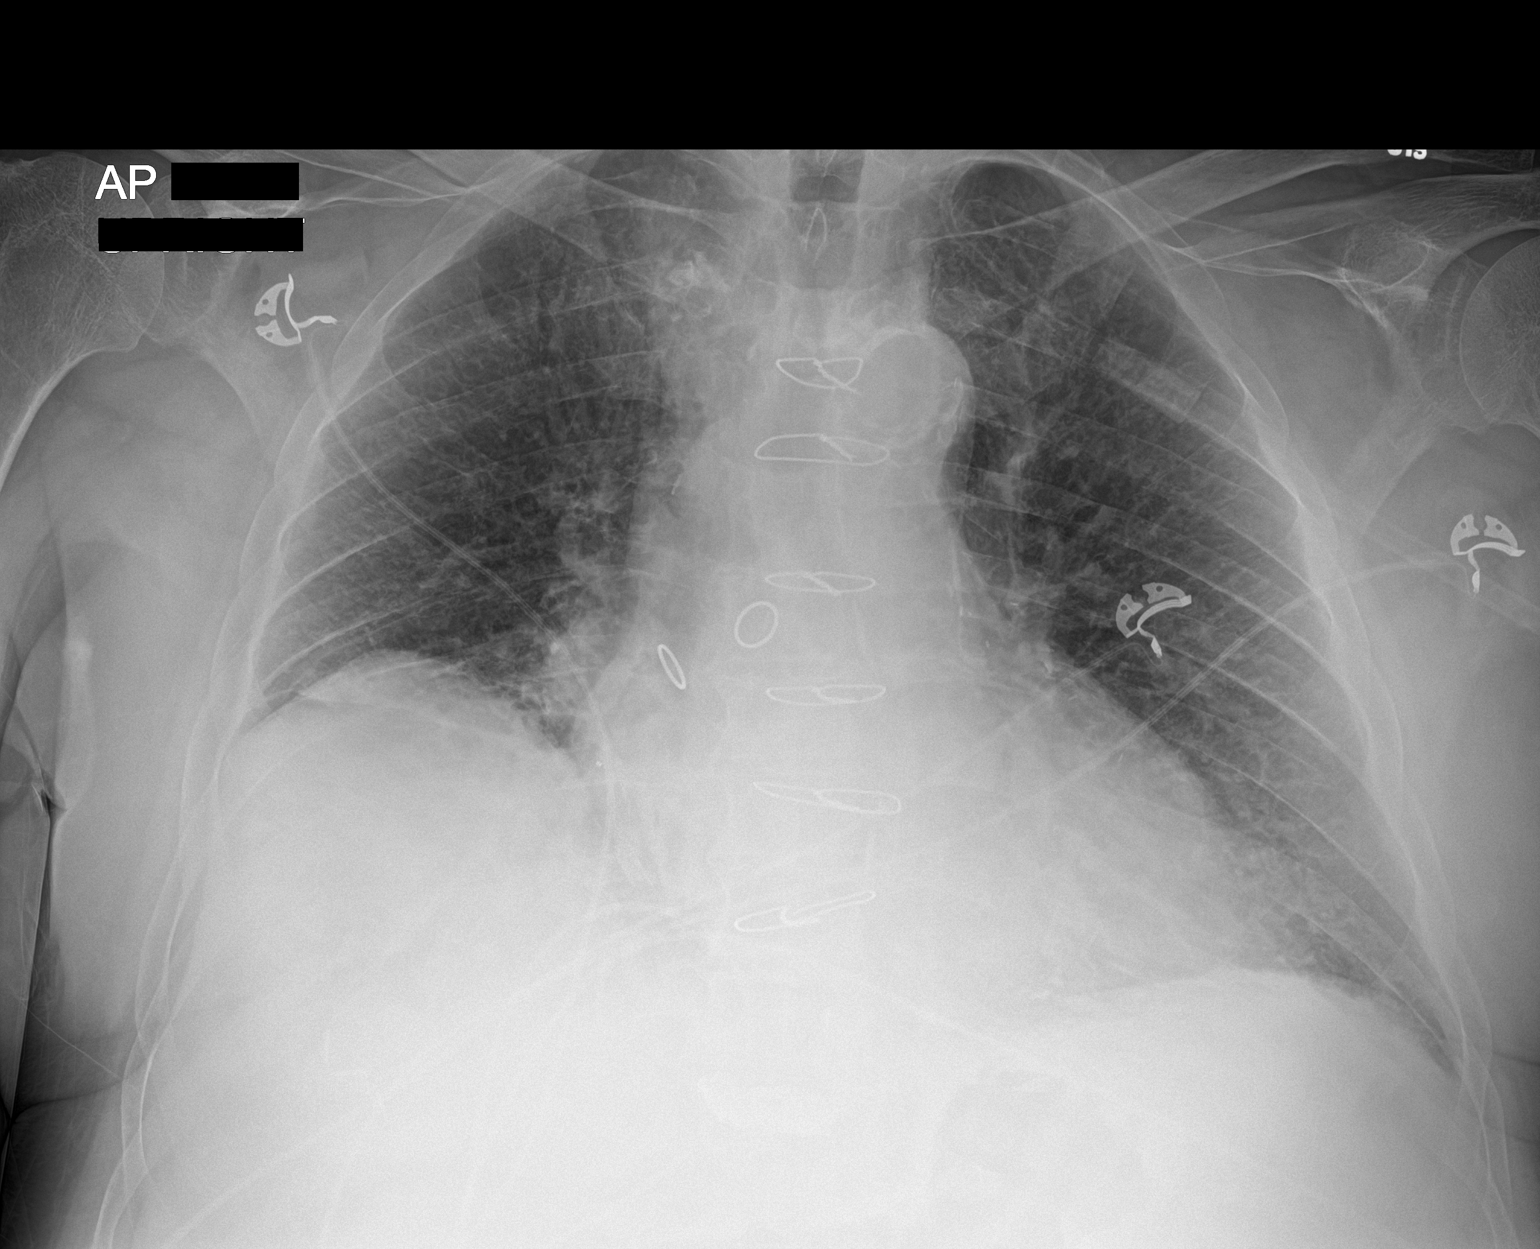

[1 of 1 positions shown; findings below may reference images not displayed]

FINDINGS: Some interval improvement in the pulmonary vascular congestion seen
previously. Persistent elevated right hemidiaphragm with
bronchovascular crowding at the lung bases right worse than left.

Heart size upper limits normal for technique. Aortic Atherosclerosis
(ZZFY4-170.0). Previous CABG.

No effusion. No pneumothorax.

Sternotomy wires.
IMPRESSION: 1. Improving pulmonary vascular congestion.
2. Persistent elevated right hemidiaphragm.
3. Previous CABG.

## 2020-08-23 IMAGING — DX DG CHEST 1V PORT
1 series · 1 of 1 positions shown · non-contrast
Comparison: 08/09/2019.  04/20/2016.

CLINICAL DATA: Pulmonary vascular congestion.

EXAM:
PORTABLE CHEST 1 VIEW

[chest ap]
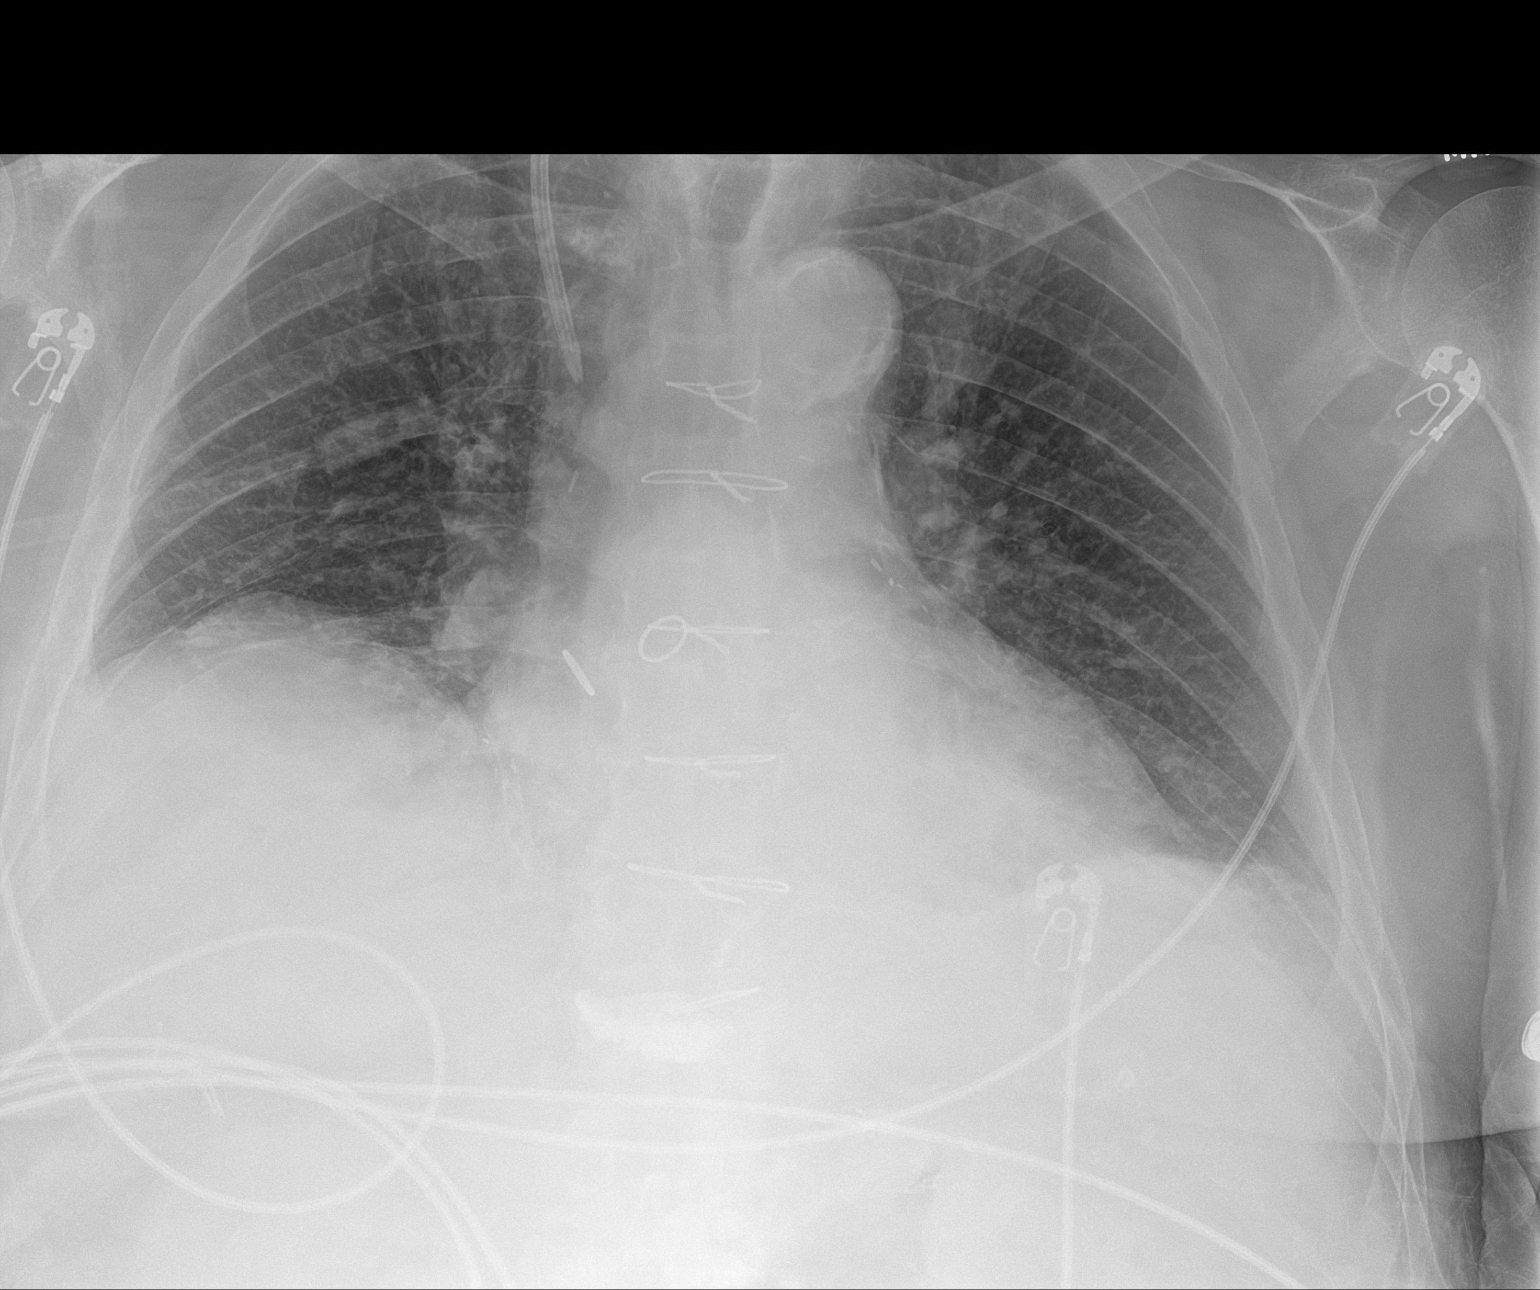

[1 of 1 positions shown; findings below may reference images not displayed]

FINDINGS: Right IJ line noted with tip over superior vena cava. Prior CABG.
Cardiomegaly. Mild pulmonary venous congestion. Mild bibasilar
atelectasis. Minimal right base pleural thickening again noted
consistent scarring. No pneumothorax. Prior thoracic vertebroplasty.
IMPRESSION: 1.  Right IJ line noted with tip over superior vena cava.

2.  Prior CABG.  Cardiomegaly.  No pulmonary venous congestion.

3. Mild bibasilar atelectasis. Tiny right pleural effusion versus
scarring.

## 2020-08-25 IMAGING — DX DG CERVICAL SPINE 2 OR 3 VIEWS
2 series · 3 of 3 positions shown · non-contrast
Comparison: None.

CLINICAL DATA: Neck pain without known injury.

EXAM:
CERVICAL SPINE - 2-3 VIEW

[c-spine lat]
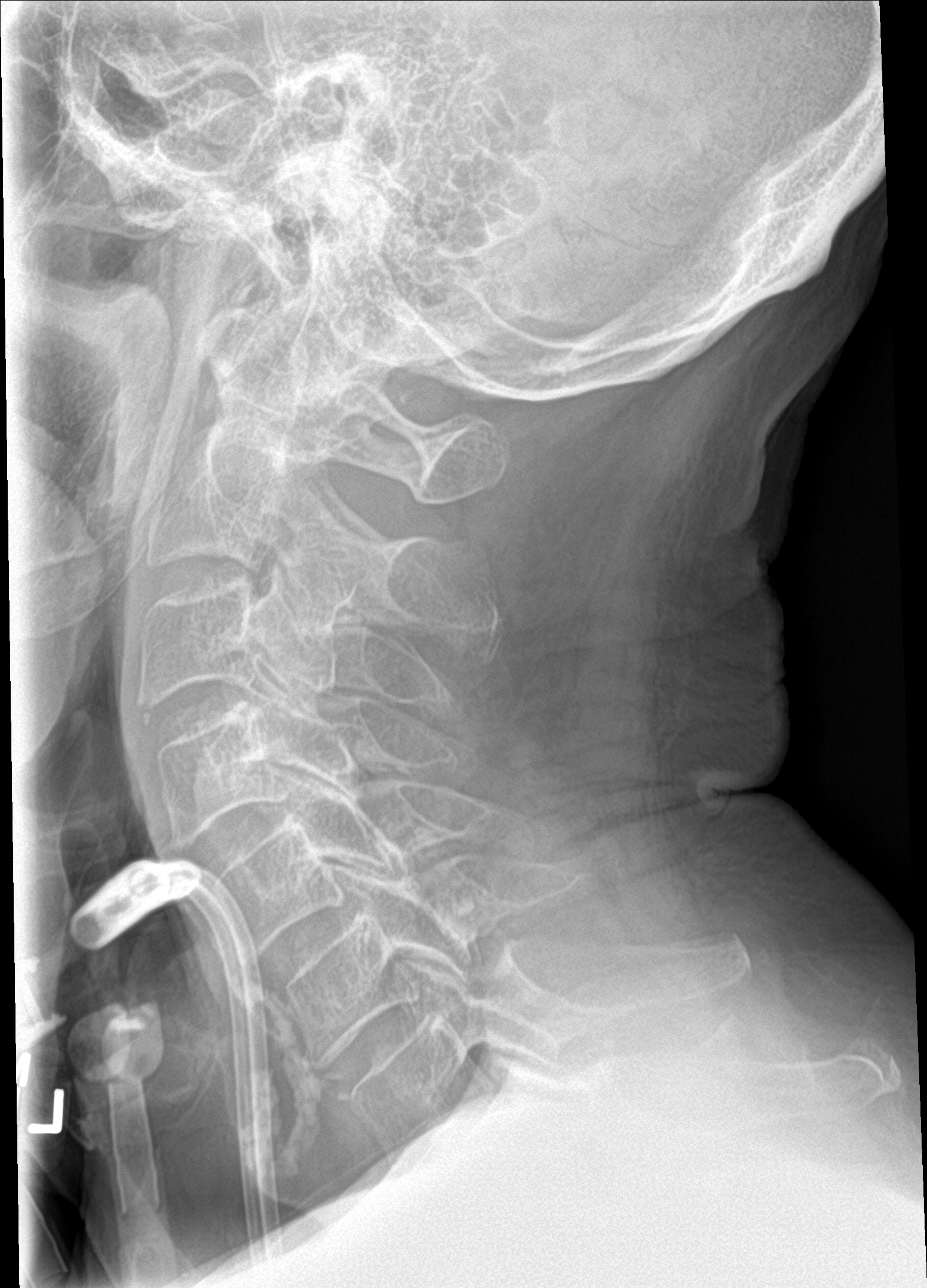

[Series 2: c-spine ap · 0.14mm/px · 2 of 2 slices shown]
[im 1/2]
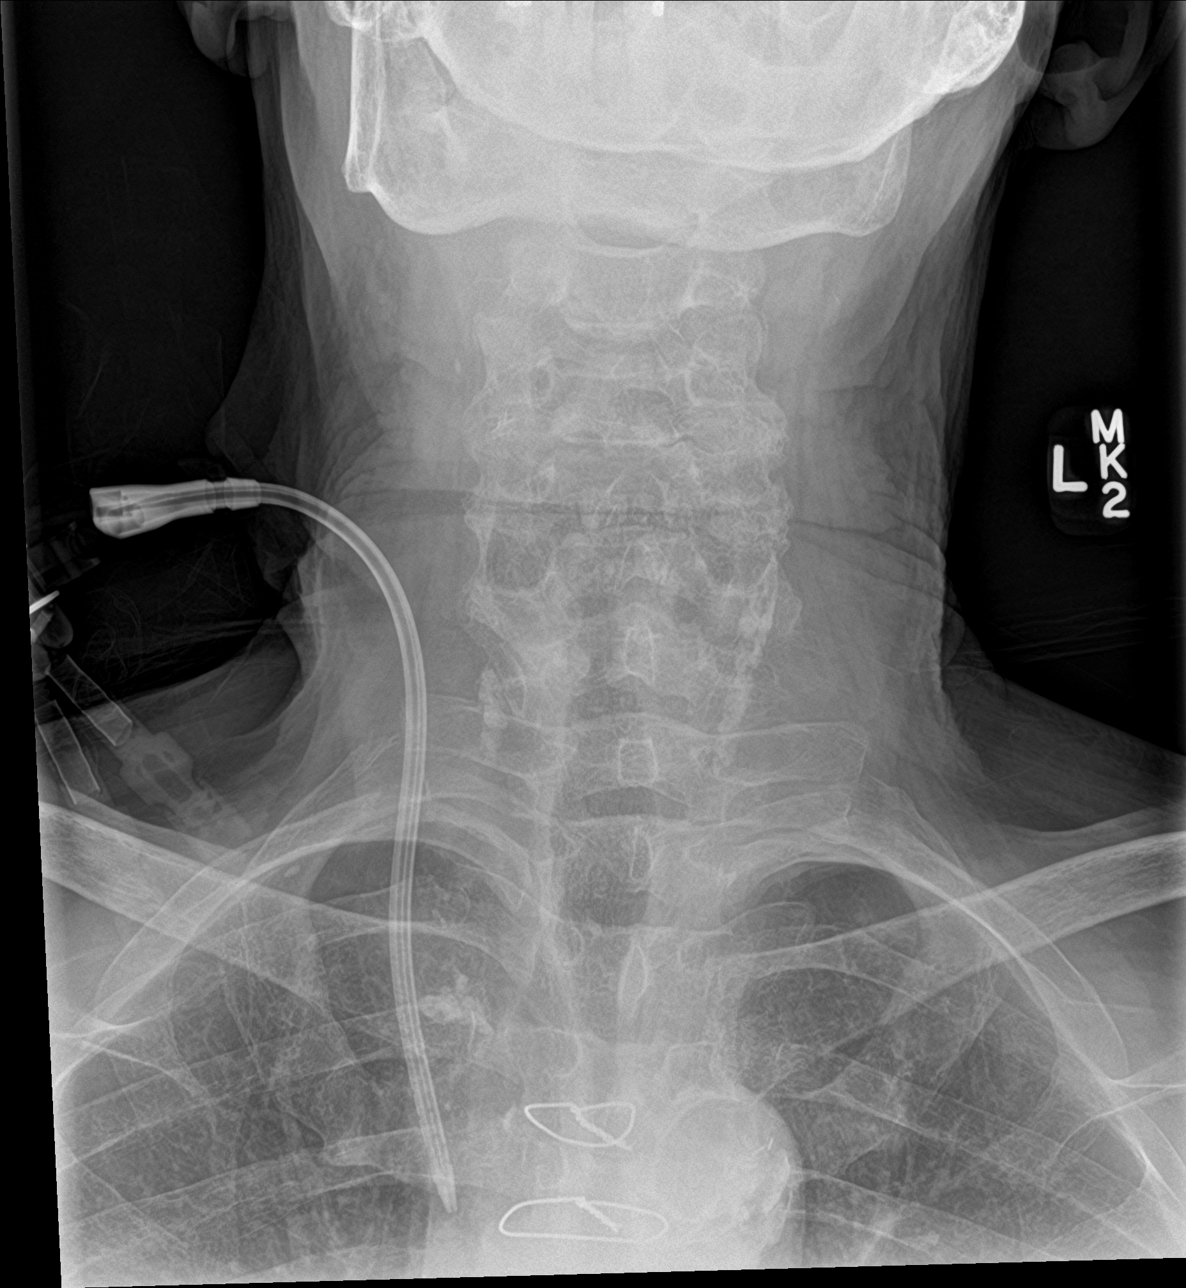
[im 2/2]
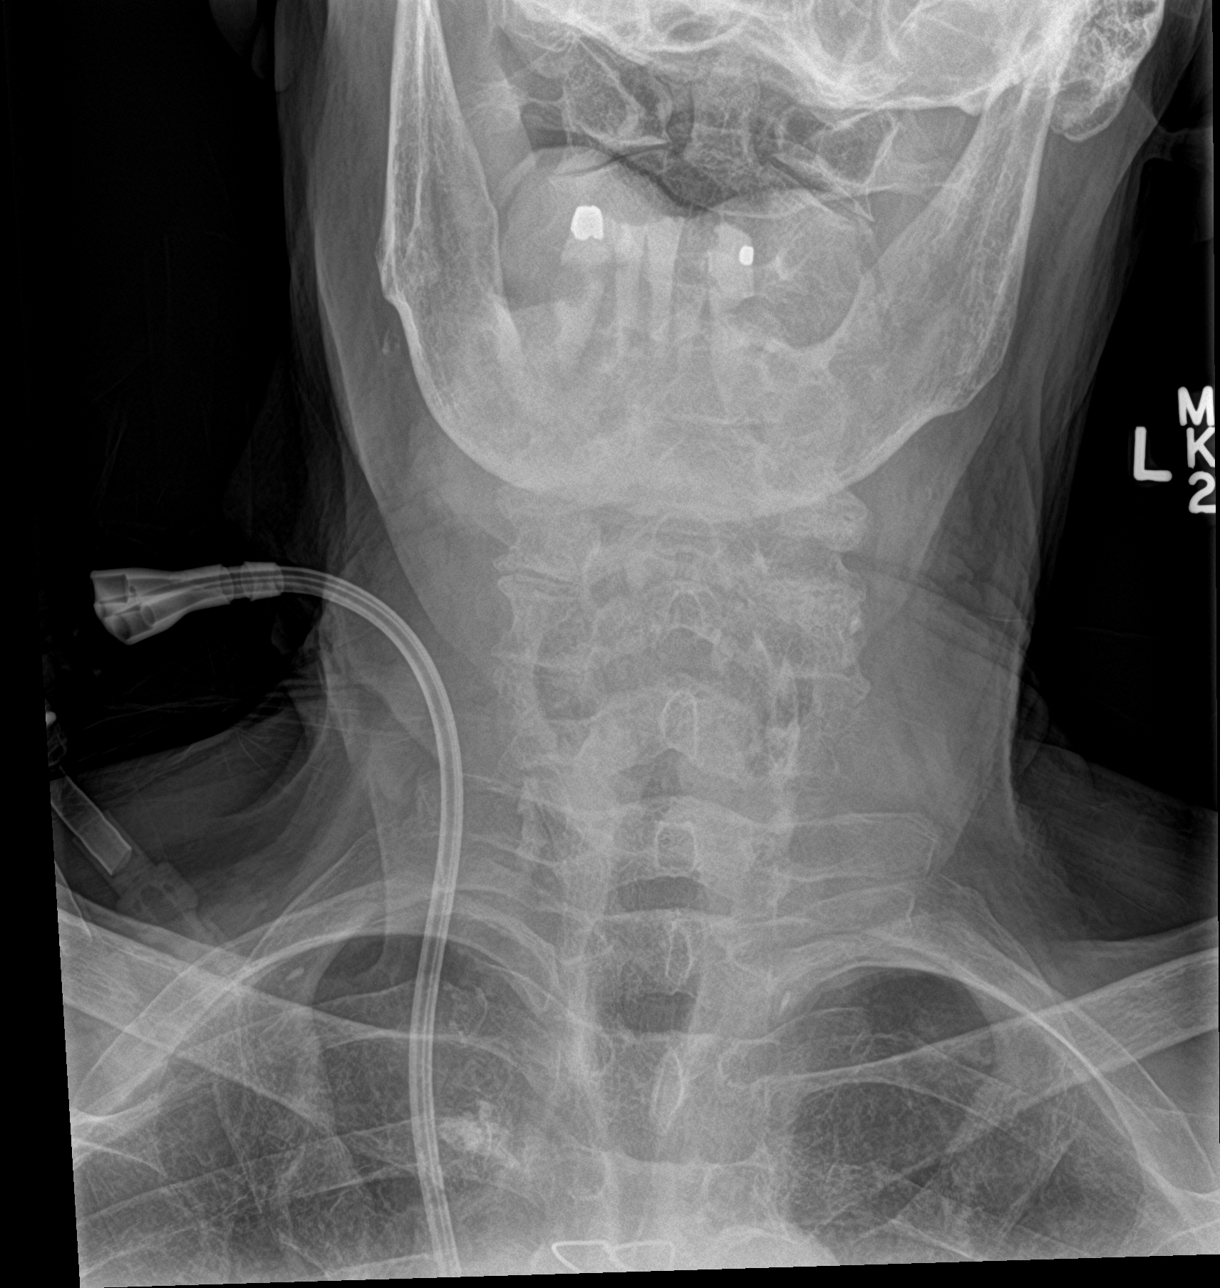

[3 of 3 positions shown; findings below may reference images not displayed]

FINDINGS: Minimal grade 1 anterolisthesis of C5-6 is noted secondary to
posterior facet joint hypertrophy. No fracture is noted. Disc spaces
are well-maintained. No prevertebral soft tissue swelling is noted.
IMPRESSION: Minimal grade 1 anterolisthesis of C5-6 secondary to posterior facet
joint hypertrophy. No other abnormality seen in the cervical spine.
